# Patient Record
Sex: Male | Born: 1935
Health system: Southern US, Community
[De-identification: ages and names within clinical notes are randomized; demographics above are authoritative.]

## PROBLEM LIST (undated history)

## (undated) DIAGNOSIS — J449 Chronic obstructive pulmonary disease, unspecified: Secondary | ICD-10-CM

## (undated) DIAGNOSIS — R7303 Prediabetes: Secondary | ICD-10-CM

## (undated) DIAGNOSIS — G4733 Obstructive sleep apnea (adult) (pediatric): Secondary | ICD-10-CM

## (undated) DIAGNOSIS — I714 Abdominal aortic aneurysm, without rupture, unspecified: Secondary | ICD-10-CM

## (undated) DIAGNOSIS — N200 Calculus of kidney: Secondary | ICD-10-CM

## (undated) DIAGNOSIS — Z87891 Personal history of nicotine dependence: Secondary | ICD-10-CM

## (undated) DIAGNOSIS — IMO0001 Reserved for inherently not codable concepts without codable children: Secondary | ICD-10-CM

## (undated) DIAGNOSIS — F419 Anxiety disorder, unspecified: Secondary | ICD-10-CM

## (undated) DIAGNOSIS — Z9989 Dependence on other enabling machines and devices: Secondary | ICD-10-CM

## (undated) DIAGNOSIS — M1711 Unilateral primary osteoarthritis, right knee: Secondary | ICD-10-CM

## (undated) DIAGNOSIS — K5790 Diverticulosis of intestine, part unspecified, without perforation or abscess without bleeding: Secondary | ICD-10-CM

## (undated) DIAGNOSIS — Z952 Presence of prosthetic heart valve: Secondary | ICD-10-CM

## (undated) DIAGNOSIS — I350 Nonrheumatic aortic (valve) stenosis: Secondary | ICD-10-CM

## (undated) DIAGNOSIS — K279 Peptic ulcer, site unspecified, unspecified as acute or chronic, without hemorrhage or perforation: Secondary | ICD-10-CM

## (undated) HISTORY — DX: Anxiety disorder, unspecified: F41.9

## (undated) HISTORY — DX: Peptic ulcer, site unspecified, unspecified as acute or chronic, without hemorrhage or perforation: K27.9

## (undated) HISTORY — PX: EYE SURGERY: SHX253

## (undated) HISTORY — DX: Obstructive sleep apnea (adult) (pediatric): G47.33

## (undated) HISTORY — DX: Personal history of nicotine dependence: Z87.891

## (undated) HISTORY — DX: Obstructive sleep apnea (adult) (pediatric): Z99.89

## (undated) HISTORY — PX: TONSILLECTOMY: SUR1361

## (undated) HISTORY — DX: Nonrheumatic aortic (valve) stenosis: I35.0

## (undated) HISTORY — DX: Diverticulosis of intestine, part unspecified, without perforation or abscess without bleeding: K57.90

## (undated) HISTORY — DX: Chronic obstructive pulmonary disease, unspecified: J44.9

## (undated) HISTORY — DX: Unilateral primary osteoarthritis, right knee: M17.11

## (undated) HISTORY — PX: CHOLECYSTECTOMY: SHX55

## (undated) HISTORY — DX: Calculus of kidney: N20.0

---

## 1983-06-17 HISTORY — PX: KNEE SURGERY: SHX244

## 1998-12-08 ENCOUNTER — Inpatient Hospital Stay (HOSPITAL_COMMUNITY): Admission: EM | Admit: 1998-12-08 | Discharge: 1998-12-13 | Payer: Self-pay | Admitting: Emergency Medicine

## 1998-12-08 ENCOUNTER — Encounter: Payer: Self-pay | Admitting: *Deleted

## 1998-12-12 ENCOUNTER — Encounter: Payer: Self-pay | Admitting: Internal Medicine

## 1999-01-25 ENCOUNTER — Ambulatory Visit: Admission: RE | Admit: 1999-01-25 | Discharge: 1999-01-25 | Payer: Self-pay | Admitting: Pulmonary Disease

## 1999-01-25 ENCOUNTER — Encounter: Payer: Self-pay | Admitting: Pulmonary Disease

## 1999-02-15 ENCOUNTER — Encounter: Payer: Self-pay | Admitting: Family Medicine

## 1999-02-15 LAB — CONVERTED CEMR LAB: PSA: 0.1 ng/mL

## 1999-12-13 ENCOUNTER — Ambulatory Visit (HOSPITAL_BASED_OUTPATIENT_CLINIC_OR_DEPARTMENT_OTHER): Admission: RE | Admit: 1999-12-13 | Discharge: 1999-12-13 | Payer: Self-pay | Admitting: Pulmonary Disease

## 1999-12-13 ENCOUNTER — Encounter: Payer: Self-pay | Admitting: Pulmonary Disease

## 2000-07-15 ENCOUNTER — Ambulatory Visit (HOSPITAL_BASED_OUTPATIENT_CLINIC_OR_DEPARTMENT_OTHER): Admission: RE | Admit: 2000-07-15 | Discharge: 2000-07-15 | Payer: Self-pay | Admitting: Internal Medicine

## 2000-08-16 ENCOUNTER — Encounter: Payer: Self-pay | Admitting: Pulmonary Disease

## 2001-09-14 ENCOUNTER — Encounter: Payer: Self-pay | Admitting: Family Medicine

## 2001-12-12 ENCOUNTER — Emergency Department (HOSPITAL_COMMUNITY): Admission: EM | Admit: 2001-12-12 | Discharge: 2001-12-13 | Payer: Self-pay | Admitting: Emergency Medicine

## 2001-12-13 ENCOUNTER — Encounter: Payer: Self-pay | Admitting: Emergency Medicine

## 2002-12-15 ENCOUNTER — Encounter: Payer: Self-pay | Admitting: Family Medicine

## 2002-12-15 LAB — CONVERTED CEMR LAB: PSA: 0.1 ng/mL

## 2003-08-30 ENCOUNTER — Ambulatory Visit (HOSPITAL_BASED_OUTPATIENT_CLINIC_OR_DEPARTMENT_OTHER): Admission: RE | Admit: 2003-08-30 | Discharge: 2003-08-30 | Payer: Self-pay | Admitting: Pulmonary Disease

## 2003-10-04 ENCOUNTER — Encounter: Payer: Self-pay | Admitting: Pulmonary Disease

## 2004-01-28 ENCOUNTER — Emergency Department (HOSPITAL_COMMUNITY): Admission: EM | Admit: 2004-01-28 | Discharge: 2004-01-29 | Payer: Self-pay | Admitting: Emergency Medicine

## 2004-05-29 ENCOUNTER — Ambulatory Visit: Payer: Self-pay | Admitting: Family Medicine

## 2004-06-27 ENCOUNTER — Ambulatory Visit: Payer: Self-pay | Admitting: Pulmonary Disease

## 2005-02-01 IMAGING — CR DG CHEST 1V PORT
1 series · 1 of 1 positions shown · non-contrast
Comparison: none

CLINICAL DATA: Chest pain. 
 PORTABLE CHEST, 01/28/04, [DATE] HOURS

[view not recorded]
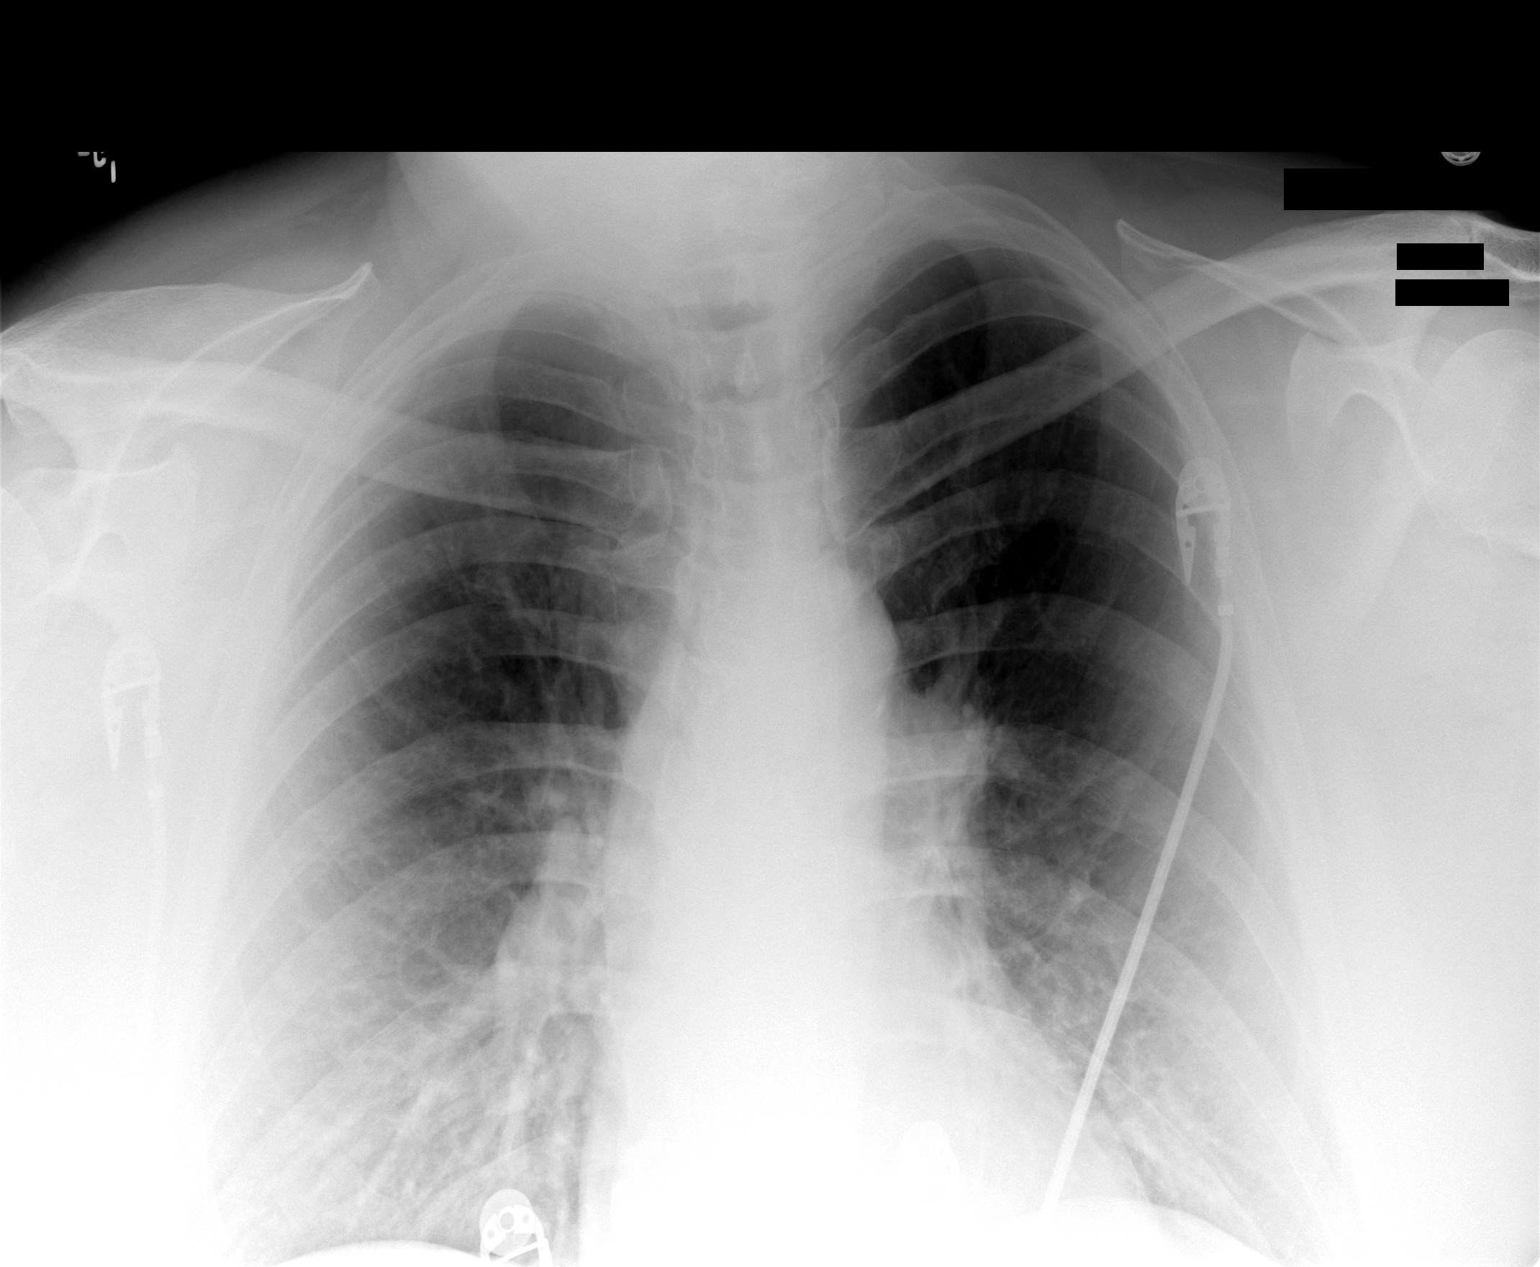

[1 of 1 positions shown; findings below may reference images not displayed]

FINDINGS: The costophrenic angles were not included in the study.  The visualized lung zones are clear.  The heart is normal in size.  No pneumothoraces or effusions are seen.
IMPRESSION: No evidence of acute cardiopulmonary disease.

## 2005-08-29 ENCOUNTER — Ambulatory Visit: Payer: Self-pay | Admitting: Family Medicine

## 2006-02-03 ENCOUNTER — Ambulatory Visit: Payer: Self-pay | Admitting: Family Medicine

## 2006-02-03 LAB — CONVERTED CEMR LAB: PSA: 0.13 ng/mL

## 2006-02-05 ENCOUNTER — Ambulatory Visit: Payer: Self-pay | Admitting: Family Medicine

## 2006-02-27 ENCOUNTER — Ambulatory Visit: Payer: Self-pay | Admitting: Pulmonary Disease

## 2006-03-02 ENCOUNTER — Ambulatory Visit: Payer: Self-pay | Admitting: Family Medicine

## 2006-03-03 ENCOUNTER — Encounter: Payer: Self-pay | Admitting: Pulmonary Disease

## 2006-03-03 ENCOUNTER — Ambulatory Visit: Payer: Self-pay | Admitting: Cardiology

## 2006-04-27 ENCOUNTER — Ambulatory Visit: Payer: Self-pay | Admitting: Family Medicine

## 2006-08-13 ENCOUNTER — Ambulatory Visit: Payer: Self-pay | Admitting: Pulmonary Disease

## 2006-09-03 ENCOUNTER — Ambulatory Visit: Payer: Self-pay | Admitting: Family Medicine

## 2006-09-03 LAB — CONVERTED CEMR LAB
Cholesterol: 164 mg/dL (ref 0–200)
HDL: 37.8 mg/dL — ABNORMAL LOW (ref >39.0)
LDL Cholesterol: 100 mg/dL — ABNORMAL HIGH (ref 0–99)
Triglycerides: 130 mg/dL (ref 0–149)
VLDL: 26 mg/dL (ref 0–40)

## 2006-09-07 ENCOUNTER — Ambulatory Visit: Payer: Self-pay | Admitting: Family Medicine

## 2006-09-18 ENCOUNTER — Ambulatory Visit: Payer: Self-pay | Admitting: Family Medicine

## 2006-09-18 LAB — CONVERTED CEMR LAB
BUN: 13 mg/dL (ref 6–23)
CO2: 36 meq/L — ABNORMAL HIGH (ref 19–32)
Creatinine, Ser: 0.9 mg/dL (ref 0.4–1.5)
Glucose, Bld: 133 mg/dL — ABNORMAL HIGH (ref 70–99)
Potassium: 4.2 meq/L (ref 3.5–5.1)

## 2006-09-21 ENCOUNTER — Ambulatory Visit: Payer: Self-pay | Admitting: Family Medicine

## 2006-10-02 ENCOUNTER — Ambulatory Visit: Payer: Self-pay | Admitting: Family Medicine

## 2006-10-02 LAB — CONVERTED CEMR LAB
CO2: 33 meq/L — ABNORMAL HIGH (ref 19–32)
Calcium: 9.3 mg/dL (ref 8.4–10.5)
Chloride: 105 meq/L (ref 96–112)
Glucose, Bld: 103 mg/dL — ABNORMAL HIGH (ref 70–99)
Sodium: 145 meq/L (ref 135–145)

## 2006-10-06 ENCOUNTER — Ambulatory Visit: Payer: Self-pay | Admitting: Family Medicine

## 2006-10-20 ENCOUNTER — Telehealth (INDEPENDENT_AMBULATORY_CARE_PROVIDER_SITE_OTHER): Payer: Self-pay | Admitting: *Deleted

## 2006-10-21 ENCOUNTER — Ambulatory Visit: Payer: Self-pay | Admitting: Family Medicine

## 2006-10-26 ENCOUNTER — Ambulatory Visit: Payer: Self-pay | Admitting: Family Medicine

## 2006-10-27 ENCOUNTER — Ambulatory Visit: Payer: Self-pay | Admitting: Family Medicine

## 2006-10-29 ENCOUNTER — Ambulatory Visit: Payer: Self-pay | Admitting: Family Medicine

## 2006-10-30 ENCOUNTER — Telehealth (INDEPENDENT_AMBULATORY_CARE_PROVIDER_SITE_OTHER): Payer: Self-pay | Admitting: *Deleted

## 2006-11-02 ENCOUNTER — Ambulatory Visit: Payer: Self-pay | Admitting: Family Medicine

## 2006-11-10 ENCOUNTER — Telehealth (INDEPENDENT_AMBULATORY_CARE_PROVIDER_SITE_OTHER): Payer: Self-pay | Admitting: *Deleted

## 2006-11-13 ENCOUNTER — Telehealth: Payer: Self-pay | Admitting: Family Medicine

## 2006-11-16 ENCOUNTER — Ambulatory Visit: Payer: Self-pay | Admitting: Family Medicine

## 2006-11-19 ENCOUNTER — Encounter: Payer: Self-pay | Admitting: Family Medicine

## 2006-11-19 DIAGNOSIS — G4730 Sleep apnea, unspecified: Secondary | ICD-10-CM | POA: Insufficient documentation

## 2006-11-19 DIAGNOSIS — R222 Localized swelling, mass and lump, trunk: Secondary | ICD-10-CM

## 2006-11-19 DIAGNOSIS — Z87891 Personal history of nicotine dependence: Secondary | ICD-10-CM | POA: Insufficient documentation

## 2006-11-19 DIAGNOSIS — M549 Dorsalgia, unspecified: Secondary | ICD-10-CM | POA: Insufficient documentation

## 2006-11-19 DIAGNOSIS — N200 Calculus of kidney: Secondary | ICD-10-CM

## 2006-11-19 DIAGNOSIS — N400 Enlarged prostate without lower urinary tract symptoms: Secondary | ICD-10-CM

## 2006-11-19 DIAGNOSIS — K279 Peptic ulcer, site unspecified, unspecified as acute or chronic, without hemorrhage or perforation: Secondary | ICD-10-CM | POA: Insufficient documentation

## 2006-11-20 ENCOUNTER — Ambulatory Visit: Payer: Self-pay | Admitting: Family Medicine

## 2006-12-04 ENCOUNTER — Ambulatory Visit: Payer: Self-pay | Admitting: Family Medicine

## 2006-12-04 DIAGNOSIS — D485 Neoplasm of uncertain behavior of skin: Secondary | ICD-10-CM

## 2007-02-10 ENCOUNTER — Ambulatory Visit: Payer: Self-pay | Admitting: Family Medicine

## 2007-02-10 DIAGNOSIS — J439 Emphysema, unspecified: Secondary | ICD-10-CM

## 2007-02-10 DIAGNOSIS — I10 Essential (primary) hypertension: Secondary | ICD-10-CM | POA: Insufficient documentation

## 2007-02-11 LAB — CONVERTED CEMR LAB
Albumin: 3.7 g/dL (ref 3.5–5.2)
Basophils Absolute: 0 10*3/uL (ref 0.0–0.1)
Cholesterol: 171 mg/dL (ref 0–200)
Creatinine, Ser: 0.9 mg/dL (ref 0.4–1.5)
Eosinophils Absolute: 0.2 10*3/uL (ref 0.0–0.6)
GFR calc Af Amer: 107 mL/min
GFR calc non Af Amer: 88 mL/min
HCT: 39.8 % (ref 39.0–52.0)
HDL: 36.6 mg/dL — ABNORMAL LOW (ref >39.0)
Hemoglobin: 13.6 g/dL (ref 13.0–17.0)
Lymphocytes Relative: 40.6 % (ref 12.0–46.0)
MCHC: 34.3 g/dL (ref 30.0–36.0)
MCV: 91.3 fL (ref 78.0–100.0)
Monocytes Absolute: 0.4 10*3/uL (ref 0.2–0.7)
Neutro Abs: 2.2 10*3/uL (ref 1.4–7.7)
Neutrophils Relative %: 47.3 % (ref 43.0–77.0)
PSA: 0.2 ng/mL (ref 0.10–4.00)
Potassium: 4.6 meq/L (ref 3.5–5.1)
Sodium: 145 meq/L (ref 135–145)
TSH: 4.93 microintl units/mL (ref 0.35–5.50)
Total Bilirubin: 1.2 mg/dL (ref 0.3–1.2)

## 2007-02-18 ENCOUNTER — Ambulatory Visit: Payer: Self-pay | Admitting: Family Medicine

## 2007-02-18 DIAGNOSIS — R7309 Other abnormal glucose: Secondary | ICD-10-CM | POA: Insufficient documentation

## 2007-02-18 DIAGNOSIS — R1084 Generalized abdominal pain: Secondary | ICD-10-CM | POA: Insufficient documentation

## 2007-03-04 ENCOUNTER — Ambulatory Visit: Payer: Self-pay | Admitting: Pulmonary Disease

## 2007-03-11 ENCOUNTER — Ambulatory Visit: Payer: Self-pay | Admitting: Gastroenterology

## 2007-03-22 ENCOUNTER — Ambulatory Visit: Payer: Self-pay | Admitting: Family Medicine

## 2007-03-23 ENCOUNTER — Ambulatory Visit: Payer: Self-pay | Admitting: Gastroenterology

## 2007-03-23 ENCOUNTER — Encounter: Payer: Self-pay | Admitting: Family Medicine

## 2007-03-23 DIAGNOSIS — K5730 Diverticulosis of large intestine without perforation or abscess without bleeding: Secondary | ICD-10-CM | POA: Insufficient documentation

## 2007-03-23 DIAGNOSIS — K648 Other hemorrhoids: Secondary | ICD-10-CM | POA: Insufficient documentation

## 2007-06-01 ENCOUNTER — Telehealth: Payer: Self-pay | Admitting: Family Medicine

## 2007-06-07 ENCOUNTER — Encounter (INDEPENDENT_AMBULATORY_CARE_PROVIDER_SITE_OTHER): Payer: Self-pay | Admitting: *Deleted

## 2007-07-12 ENCOUNTER — Ambulatory Visit: Payer: Self-pay | Admitting: Family Medicine

## 2007-07-14 ENCOUNTER — Ambulatory Visit: Payer: Self-pay | Admitting: Family Medicine

## 2007-07-17 ENCOUNTER — Encounter: Payer: Self-pay | Admitting: Pulmonary Disease

## 2007-08-27 DIAGNOSIS — J439 Emphysema, unspecified: Secondary | ICD-10-CM

## 2007-08-27 DIAGNOSIS — F411 Generalized anxiety disorder: Secondary | ICD-10-CM | POA: Insufficient documentation

## 2007-08-27 DIAGNOSIS — D126 Benign neoplasm of colon, unspecified: Secondary | ICD-10-CM

## 2007-10-21 ENCOUNTER — Telehealth: Payer: Self-pay | Admitting: Family Medicine

## 2008-02-01 ENCOUNTER — Ambulatory Visit: Payer: Self-pay | Admitting: Pulmonary Disease

## 2008-02-01 DIAGNOSIS — J438 Other emphysema: Secondary | ICD-10-CM | POA: Insufficient documentation

## 2008-02-18 ENCOUNTER — Telehealth (INDEPENDENT_AMBULATORY_CARE_PROVIDER_SITE_OTHER): Payer: Self-pay | Admitting: *Deleted

## 2008-02-22 ENCOUNTER — Ambulatory Visit: Payer: Self-pay | Admitting: Family Medicine

## 2008-04-08 ENCOUNTER — Encounter: Payer: Self-pay | Admitting: Pulmonary Disease

## 2008-07-24 ENCOUNTER — Ambulatory Visit: Payer: Self-pay | Admitting: Pulmonary Disease

## 2008-08-03 ENCOUNTER — Ambulatory Visit: Payer: Self-pay | Admitting: Pulmonary Disease

## 2008-08-03 DIAGNOSIS — J9611 Chronic respiratory failure with hypoxia: Secondary | ICD-10-CM | POA: Insufficient documentation

## 2008-08-03 DIAGNOSIS — J9610 Chronic respiratory failure, unspecified whether with hypoxia or hypercapnia: Secondary | ICD-10-CM

## 2008-08-10 ENCOUNTER — Encounter: Payer: Self-pay | Admitting: Internal Medicine

## 2008-10-03 ENCOUNTER — Telehealth (INDEPENDENT_AMBULATORY_CARE_PROVIDER_SITE_OTHER): Payer: Self-pay | Admitting: *Deleted

## 2009-01-10 ENCOUNTER — Ambulatory Visit: Payer: Self-pay | Admitting: Family Medicine

## 2009-01-10 LAB — CONVERTED CEMR LAB
ALT: 27 units/L (ref 0–53)
AST: 27 units/L (ref 0–37)
Albumin: 3.8 g/dL (ref 3.5–5.2)
BUN: 13 mg/dL (ref 6–23)
Chloride: 106 meq/L (ref 96–112)
Cholesterol: 190 mg/dL (ref 0–200)
Eosinophils Relative: 3.1 % (ref 0.0–5.0)
GFR calc non Af Amer: 100.62 mL/min (ref >60)
Glucose, Bld: 102 mg/dL — ABNORMAL HIGH (ref 70–99)
HCT: 39.5 % (ref 39.0–52.0)
Hemoglobin: 13.4 g/dL (ref 13.0–17.0)
Lymphs Abs: 1.8 10*3/uL (ref 0.7–4.0)
MCV: 92.7 fL (ref 78.0–100.0)
Microalb, Ur: 6.4 mg/dL — ABNORMAL HIGH (ref 0.0–1.9)
Monocytes Absolute: 0.5 10*3/uL (ref 0.1–1.0)
Monocytes Relative: 10.3 % (ref 3.0–12.0)
Neutro Abs: 2.3 10*3/uL (ref 1.4–7.7)
Platelets: 212 10*3/uL (ref 150.0–400.0)
Potassium: 4.3 meq/L (ref 3.5–5.1)
RDW: 13.3 % (ref 11.5–14.6)
Sodium: 145 meq/L (ref 135–145)
TSH: 4.58 microintl units/mL (ref 0.35–5.50)
Total Bilirubin: 1.4 mg/dL — ABNORMAL HIGH (ref 0.3–1.2)
WBC: 4.7 10*3/uL (ref 4.5–10.5)

## 2009-01-15 ENCOUNTER — Ambulatory Visit: Payer: Self-pay | Admitting: Family Medicine

## 2009-02-05 IMAGING — CR DG CHEST 2V
2 series · 2 of 2 positions shown · non-contrast
Comparison: Scout film from CT chest of 03/03/2006

CLINICAL DATA: Emphysema, sleep apnea, shortness of breath

CHEST - 2 VIEW

[view not recorded (1 of 2)]
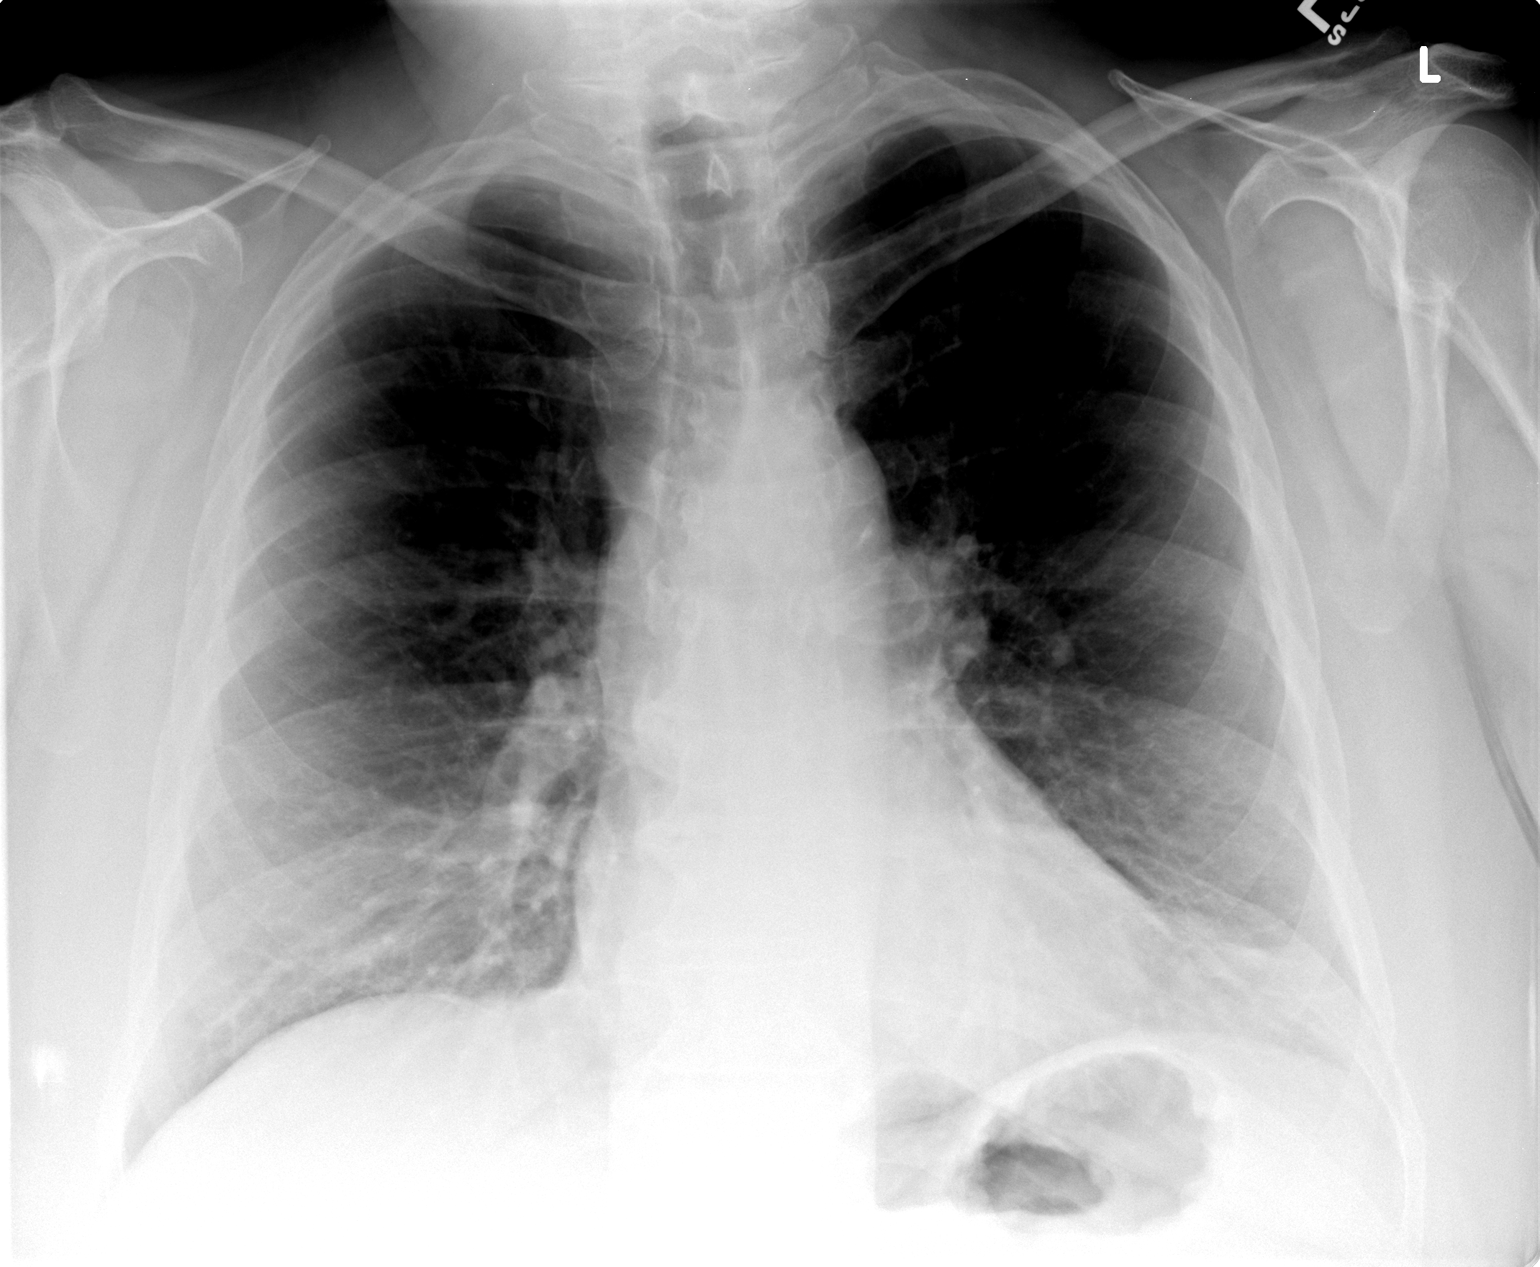

[view not recorded (2 of 2)]
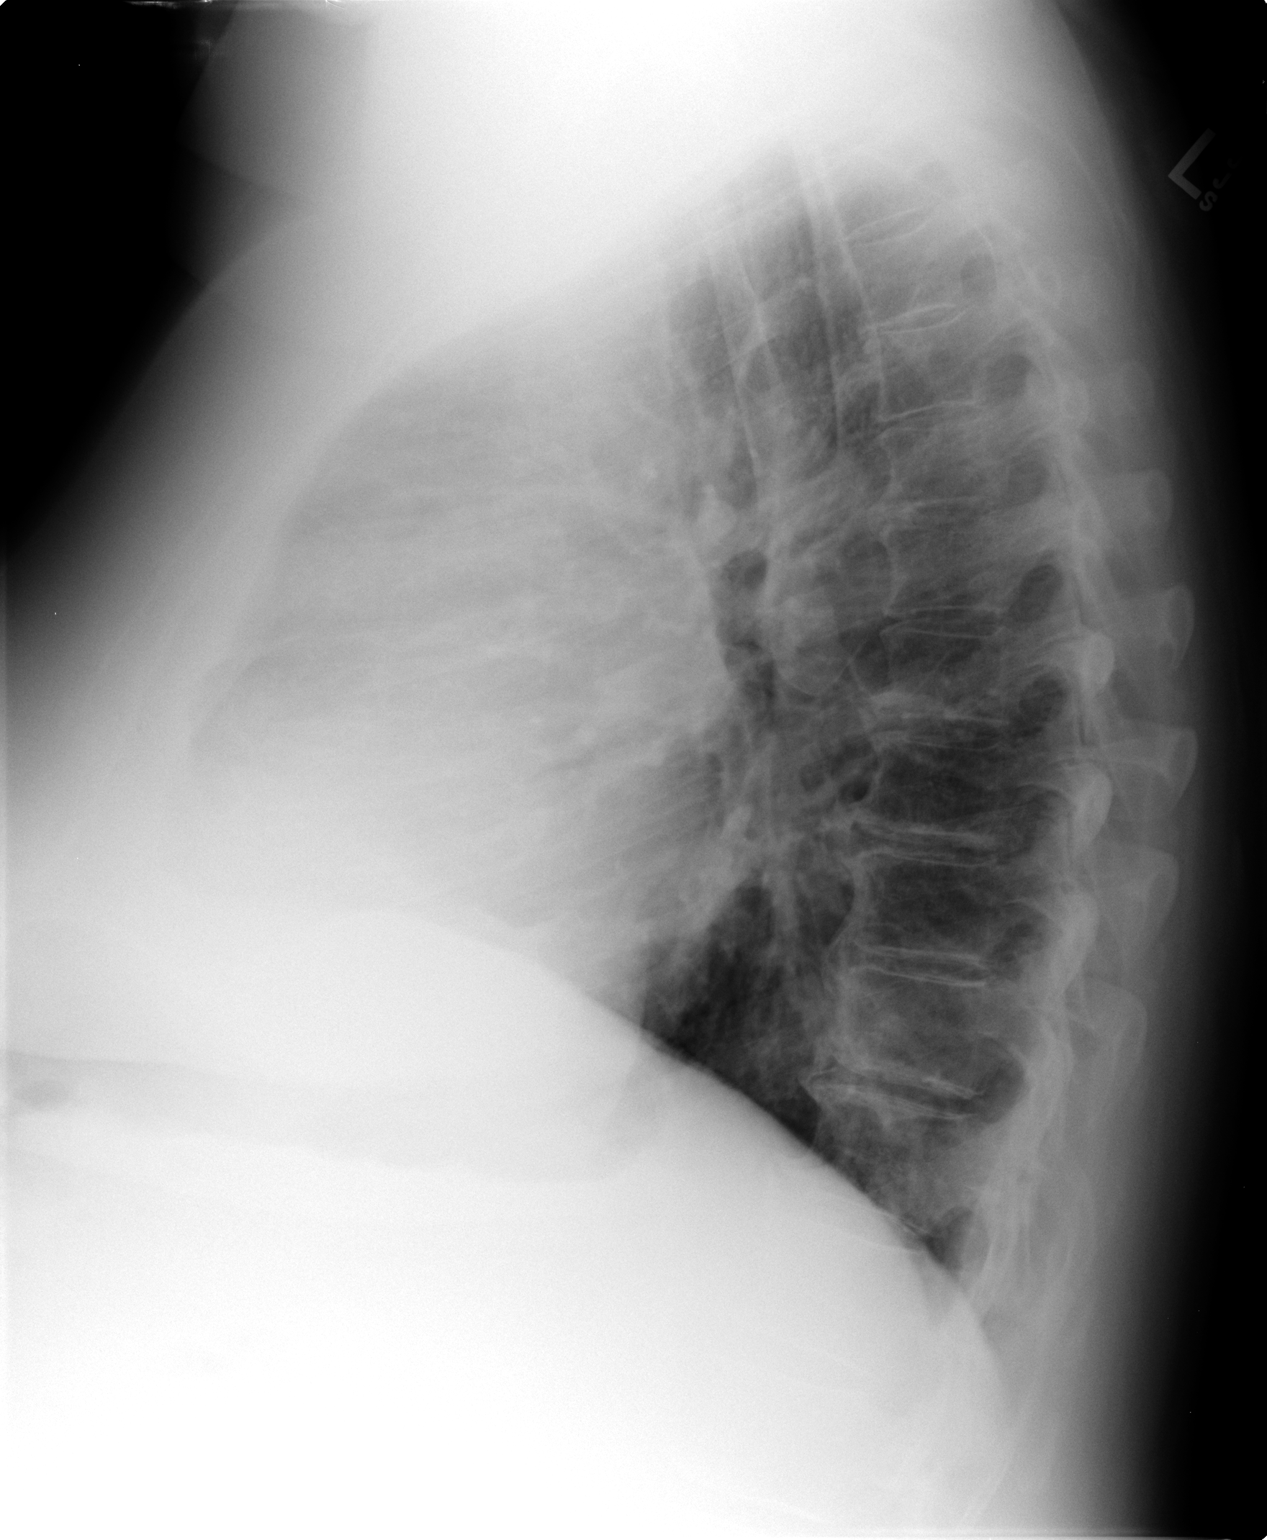

[2 of 2 positions shown; findings below may reference images not displayed]

FINDINGS: The lungs are clear and slightly hyperaerated.  Mild
cardiomegaly is stable.  There are degenerative changes throughout
the thoracic spine.  The small superior anterior mediastinal soft
tissue nodule noted on CT is not visible on chest x-ray.
IMPRESSION: No active lung disease.  Slight hyperaeration.  Mild cardiomegaly.

## 2009-04-10 ENCOUNTER — Ambulatory Visit: Payer: Self-pay | Admitting: Family Medicine

## 2009-04-23 ENCOUNTER — Encounter: Payer: Self-pay | Admitting: Family Medicine

## 2009-04-23 ENCOUNTER — Encounter: Payer: Self-pay | Admitting: Pulmonary Disease

## 2009-05-16 ENCOUNTER — Encounter: Payer: Self-pay | Admitting: Family Medicine

## 2009-07-31 ENCOUNTER — Ambulatory Visit: Payer: Self-pay | Admitting: Pulmonary Disease

## 2009-07-31 DIAGNOSIS — G4733 Obstructive sleep apnea (adult) (pediatric): Secondary | ICD-10-CM | POA: Insufficient documentation

## 2010-01-17 ENCOUNTER — Encounter (INDEPENDENT_AMBULATORY_CARE_PROVIDER_SITE_OTHER): Payer: Self-pay | Admitting: *Deleted

## 2010-01-28 ENCOUNTER — Ambulatory Visit: Payer: Self-pay | Admitting: Pulmonary Disease

## 2010-04-10 ENCOUNTER — Ambulatory Visit: Payer: Self-pay | Admitting: Family Medicine

## 2010-04-10 LAB — CONVERTED CEMR LAB
Bacteria, UA: 0
Blood in Urine, dipstick: NEGATIVE
Glucose, Urine, Semiquant: NEGATIVE
Ketones, urine, test strip: NEGATIVE
Nitrite: NEGATIVE
RBC / HPF: 0

## 2010-05-31 ENCOUNTER — Ambulatory Visit: Payer: Self-pay | Admitting: Urology

## 2010-07-16 NOTE — Progress Notes (Signed)
Summary: 02 sat  Phone Note From Other Clinic Call back at [phone]   Caller: mary @ [facility] Call For: Clance Summary of Call: Need most recent 02 saturations faxed to [phone] Initial call taken by: Eugene Gavia,  February 18, 2008 8:43 AM  Follow-up for Phone Call        o2 sat from 8/18 ov faxed to [facility]-this was not a qualifying sat for o2 but I was unable to reach mary with [facility] to find out what she needed this for because she was out until 02/22/08-sent fax if this is not what she needs sent note for her to give [place] a call back on her return Follow-up by: Philipp Deputy CMA,  February 18, 2008 10:54 AM

## 2010-07-16 NOTE — Assessment & Plan Note (Signed)
Summary: STOMACH/CLE   Vital Signs:  Patient profile:   77 year old male Weight:      292.75 pounds Temp:     97.8 degrees F oral Pulse rate:   84 / minute Pulse rhythm:   regular BP sitting:   118 / 80  (left arm) Cuff size:   large  Vitals Entered By: Sydell Axon LPN (April 10, 2010 11:24 AM) CC: Stomach problems, feels like he has to have a BM a lot, ? kidney stone, burning, urine frequency and urgency   History of Present Illness: Pt here with his wife with feeling like he constantly needs to go to the [facility] for a BM. He has had the sensation of leaving for somewhere and needs to go but can't. He also feels like he has a kidney stone...he has pain in the flanks and has started drinking lots of fluids...he thinks he may have washed the stones down. We discussed drinking cherry juice.  Problems Prior to Update: 1)  Obesity Hypoventilation Syndrome  (ICD-278.03) 2)  Chronic Respiratory Failure  (ZOX-096.04) 3)  Morbid Obesity  (ICD-278.01) 4)  Abnormal Chest Xray  (ICD-793.1) 5)  Emphysema  (ICD-492.8) 6)  Obstructive Sleep Apnea  (ICD-327.23) 7)  Anxiety  (ICD-300.00) 8)  COPD  (ICD-496) 9)  Polyp, Colon  (ICD-211.3) 10)  Diverticulosis, Colon  (ICD-562.10) 11)  Internal Hemorrhoids  (ICD-455.0) 12)  Abnormal Chest Sounds/decreased Right  (ICD-786.7) 13)  Screening For Malignannt Neoplasm, Site Nec  (ICD-V76.49) 14)  Hyperglycemia  (ICD-790.29) 15)  Symptom, Pain, Abdominal, Generalized  (ICD-789.07) 16)  Abdominal Pain  () 17)  Hypertension, Benign Essential  (ICD-401.1) 18)  Emphysematous Bleb  (ICD-492.0) 19)  Neoplasm, Skin, Uncertain Behavior  (ICD-238.2) 20)  Symptom, Swelling/mass/lump in Chest  (ICD-786.6) 21)  Varicose Vein Symptomatic  (ICD-456.8) 22)  Gilbert's Syndrome, Elevated Bili  (ICD-277.4) 23)  Sleep Apnea On Cpap-gerd09/01  (ICD-780.57) 24)  Benign Prostatic Hypertrophy, Hx of  (ICD-V13.8) 25)  Hypercholesterolemia, 199/170  (ICD-272.0) 26)  Peptic  Ulcer Disease With H-pylori Tx'd  (ICD-533.90) 27)  Back Pain, Chronic  (ICD-724.5) 28)  Nephrolithiasis  (ICD-592.0) 29)  Tobacco Use, Quit  (ICD-V15.82) 30)  Emphysema With Acute Respiratory Failure  (ICD-492.8)  Medications Prior to Update: 1)  Lorazepam 1 Mg Tabs (Lorazepam) .Marland Kitchen.. 1 Q6 Hours Prn 2)  Proair Hfa 108 (90 Base) Mcg/act Aers (Albuterol Sulfate) .... Inhale 2 Puffs Every 4 To 6 Hours As Needed For Shortness of Breath 3)  Symbicort 160-4.5 Mcg/act Aero (Budesonide-Formoterol Fumarate) .... Inhale 2 Puffs Two Times A Day 4)  Oxygen 4 L 5)  Cpap At Bedtime  Allergies: 1)  ! Macrodantin (Nitrofurantoin Macrocrystal) 2)  ! Celebrex (Celecoxib) 3)  ! Clinoril (Sulindac) 4)  ! Lipitor (Atorvastatin Calcium)  Physical Exam  General:  Well-developed,well-nourished,in no acute distress; alert,appropriate and cooperative throughout examination, morbidly obese. Head:  Normocephalic and atraumatic without obvious abnormalities. No apparent alopecia or balding. Eyes:  Conjunctiva clear bilaterally.  Ears:  External ear exam shows no significant lesions or deformities.  Otoscopic examination reveals clear canals, tympanic membranes are intact bilaterally without bulging, retraction, inflammation or discharge. Hearing is grossly normal bilaterally. Nose:  no skin breakdown or pressure necrosis from cpap mask. nasal passages clear. Mouth:  Oral mucosa and oropharynx without lesions or exudates.  Teeth in good repair. Neck:  No deformities, masses, or tenderness noted. Chest Wall:  No deformities, masses, tenderness  noted. Physiologic gynecomastia noted bilat. Lungs:  mildly decreased bs, no wheezing,  incr I:E 1:4, worse w/ recumbency. Heart:  Normal rate and regular rhythm. S1 and S2 normal without gallop, click, rub or other extra sounds other than I-II/VI syst murmur left sternal border.. Abdomen:  Bowel sounds positive and very active, abdomen soft and non-tender without masses,  organomegaly or hernias noted. Large double panniculous with mild erythema below. Large diagonal choley scar well-healed. Rectal:  Not done.   Impression & Recommendations:  Problem # 1:  SYMPTOM, PAIN, ABDOMINAL, GENERALIZED (ICD-789.07) Assessment Deteriorated Sounds to me as if he is having chronic constipation causing a number of problems. Suggest fiber supplementation...encouraged to use chronically. Discussesd at length. U/A nml.   Problem # 2:  NEPHROLITHIASIS (ICD-592.0) Assessment: Unchanged If having stones, use cherry juice for symptomatic trmt and continue chronic fluid intake, preferably water.  Complete Medication List: 1)  Lorazepam 1 Mg Tabs (Lorazepam) .Marland Kitchen.. 1 by mouth every 6 hours  as needed 2)  Proair Hfa 108 (90 Base) Mcg/act Aers (Albuterol sulfate) .... Inhale 2 puffs every 4 to 6 hours as needed for shortness of breath 3)  Symbicort 160-4.5 Mcg/act Aero (Budesonide-formoterol fumarate) .... Inhale 2 puffs two times a day 4)  Oxygen 4 L  5)  Cpap At Bedtime   Other Orders: UA Dipstick W/ Micro (manual) (19147) Flu Vaccine 71yrs + MEDICARE PATIENTS (W2956) Administration Flu vaccine - MCR (O1308)  Patient Instructions: 1)  Try Citrucel in 8 oz water every AM. 2)  RTC if sxs continue.   Orders Added: 1)  UA Dipstick W/ Micro (manual) [81000] 2)  Flu Vaccine 15yrs + MEDICARE PATIENTS [Q2039] 3)  Administration Flu vaccine - MCR [G0008] 4)  Est. Patient Level III [65784]    Current Allergies (reviewed today): ! MACRODANTIN (NITROFURANTOIN MACROCRYSTAL) ! CELEBREX (CELECOXIB) ! CLINORIL (SULINDAC) ! LIPITOR (ATORVASTATIN CALCIUM)  Laboratory Results   Urine Tests  Date/Time Received: April 10, 2010 11:40 AM  Date/Time Reported: April 10, 2010 11:40 AM   Routine Urinalysis   Color: yellow Appearance: Clear Glucose: negative   (Normal Range: Negative) Bilirubin: negative   (Normal Range: Negative) Ketone: negative   (Normal Range:  Negative) Spec. Gravity: 1.020   (Normal Range: 1.003-1.035) Blood: negative   (Normal Range: Negative) pH: 5.0   (Normal Range: 5.0-8.0) Protein: trace   (Normal Range: Negative) Urobilinogen: 0.2   (Normal Range: 0-1) Nitrite: negative   (Normal Range: Negative) Leukocyte Esterace: trace   (Normal Range: Negative)  Urine Microscopic WBC/HPF: 0-1 RBC/HPF: 0 Bacteria/HPF: 0 Epithelial/HPF: Rare         Flu Vaccine Consent Questions     Do you have a history of severe allergic reactions to this vaccine? no    Any prior history of allergic reactions to egg and/or gelatin? no    Do you have a sensitivity to the preservative Thimersol? no    Do you have a past history of Guillan-Barre Syndrome? no    Do you currently have an acute febrile illness? no    Have you ever had a severe reaction to latex? no    Vaccine information given and explained to patient? yes    Are you currently pregnant? no    Lot Number:[id number]   Exp Date:12/14/2010   Site Given  Left Deltoid IMmedflu

## 2010-07-16 NOTE — Assessment & Plan Note (Signed)
Summary: rov for emphysema, osa, ohs   Primary Provider/Referring Provider:  Hetty Ely  CC:  Pt is here for a f/u appt.  Pt was last seen Feb 2010.  Pt states he wears his cpap machine every night.  Approx 6 to 7 hours per night.  Pt denied any complaints with mask or pressure.  Pt c/o increased sob with exertion.  Pt c/o coughing up clear sputum.  Marland Kitchen  History of Present Illness: the pt comes in today for f/u of his known chronic RF secondary to emphysema and OHS/OSA.  He has been wearing oxygen complaintly at hs and with prolonged exertion, but does not wear with short term exertional activities.  He is wearing cpap compliantly, and recently got a new cpap machine.  He is having no mask or pressure issues.  He has no significant cough or mucus.  He does get sob with some exertional activities, but admits it is often when he isn't wearing oxygen.  Current Medications (verified): 1)  Lorazepam 1 Mg Tabs (Lorazepam) .Marland Kitchen.. 1 Q6 Hours Prn 2)  Proair Hfa 108 (90 Base) Mcg/act Aers (Albuterol Sulfate) .... Inhale 2 Puffs Every 4 To 6 Hours As Needed For Shortness of Breath 3)  Symbicort 160-4.5 Mcg/act Aero (Budesonide-Formoterol Fumarate) .... Inhale 2 Puffs Two Times A Day 4)  Oxygen 2 L .... 24/7 Continuous 5)  Cpap At Bedtime  Allergies (verified): 1)  ! Macrodantin (Nitrofurantoin Macrocrystal) 2)  ! Celebrex (Celecoxib) 3)  ! Clinoril (Sulindac) 4)  ! Lipitor (Atorvastatin Calcium)  Review of Systems      See HPI  Vital Signs:  Patient profile:   77 year old male Height:      70.5 inches Weight:      306.25 pounds BMI:     43.48 O2 Sat:      92 % on Room air Temp:     97.6 degrees F oral Pulse rate:   82 / minute BP sitting:   140 / 78  (left arm) Cuff size:   large  Vitals Entered By: Arman Filter LPN (July 31, 2009 9:49 AM)  O2 Flow:  Room air CC: Pt is here for a f/u appt.  Pt was last seen Feb 2010.  Pt states he wears his cpap machine every night.  Approx 6 to 7 hours  per night.  Pt denied any complaints with mask or pressure.  Pt c/o increased sob with exertion.  Pt c/o coughing up clear sputum.   Comments Medications reviewed with patient Arman Filter LPN  July 31, 2009 9:49 AM    Physical Exam  General:  obese male in nad Nose:  no skin breakdown or pressure necrosis from cpap mask Lungs:  mild bibasilar crackles, no wheezing Heart:  rrr, no mrg Extremities:  mild ankle edema Neurologic:  alert, not sleepy, moves all 4.   Impression & Recommendations:  Problem # 1:  EMPHYSEMA (ICD-492.8)  the pt has chronic doe due to his lung disease and obesity hypoventilation.  He doesn't always wear oxygen when he ambulates, and I have explained he will get sob with this.  He has no bronchospasm on exam.  I have also told him again that weight reduction would help his breathing considably.  Problem # 2:  OBSTRUCTIVE SLEEP APNEA (ICD-327.23)  the pt is doing well with cpap, and feels that he is sleeping well  Other Orders: Est. Patient Level III (04540)  Patient Instructions: 1)  no change in cpap, oxygen, or meds.  2)  work aggressively on weight loss. 3)  followup with me in 6mos.

## 2010-07-16 NOTE — Assessment & Plan Note (Signed)
Summary: rov for ohs/osa, emphysema   Primary Provider/Referring Provider:  Hetty Ely  CC:  Pt is here for a f/u appt.  Pt states he wears his cpap machine every night.  Approx 8 hours per night.   Pt denied any complaints with mask or pressure.  Pt states he has "good days and bad days" regarding his breathing.  Pt states occ cough with small amounts of clear sputum. Marland Kitchen  History of Present Illness: The pt comes in today for f/u of his known emphysema and OHS/OSA.  He is doing very well, and has lost 12 pounds since the last visit.  He is wearing cpap consistently, and denies any issues with the device or mask.  He feels that his breathing is at its usual baseline, and no issues with cough or congestion.  He has been keeping his LE edema fairly well controlled.  Current Medications (verified): 1)  Lorazepam 1 Mg Tabs (Lorazepam) .Marland Kitchen.. 1 Q6 Hours Prn 2)  Proair Hfa 108 (90 Base) Mcg/act Aers (Albuterol Sulfate) .... Inhale 2 Puffs Every 4 To 6 Hours As Needed For Shortness of Breath 3)  Symbicort 160-4.5 Mcg/act Aero (Budesonide-Formoterol Fumarate) .... Inhale 2 Puffs Two Times A Day 4)  Oxygen 4 L 5)  Cpap At Bedtime  Allergies (verified): 1)  ! Macrodantin (Nitrofurantoin Macrocrystal) 2)  ! Celebrex (Celecoxib) 3)  ! Clinoril (Sulindac) 4)  ! Lipitor (Atorvastatin Calcium)  Review of Systems       The patient complains of shortness of breath with activity, productive cough, headaches, and nasal congestion/difficulty breathing through nose.  The patient denies shortness of breath at rest, non-productive cough, coughing up blood, chest pain, irregular heartbeats, acid heartburn, indigestion, loss of appetite, weight change, abdominal pain, difficulty swallowing, sore throat, tooth/dental problems, sneezing, itching, ear ache, anxiety, depression, hand/feet swelling, joint stiffness or pain, rash, change in color of mucus, and fever.    Vital Signs:  Patient profile:   77 year old  male Height:      70.5 inches Weight:      294 pounds BMI:     41.74 O2 Sat:      92 % on Room air Temp:     97.9 degrees F oral Pulse rate:   73 / minute BP sitting:   110 / 60  (left arm) Cuff size:   large  Vitals Entered By: Arman Filter LPN (January 28, 2010 9:23 AM)  O2 Flow:  Room air CC: Pt is here for a f/u appt.  Pt states he wears his cpap machine every night.  Approx 8 hours per night.   Pt denied any complaints with mask or pressure.  Pt states he has "good days and bad days" regarding his breathing.  Pt states occ cough with small amounts of clear sputum.  Comments Medications reviewed with patient Arman Filter LPN  January 28, 2010 9:26 AM    Physical Exam  General:  obese male in nad Nose:  no skin breakdown or pressure necrosis from cpap mask Lungs:  decreased bs throughout, no wheezing or rhonchi Heart:  rrr, 2/6 sem Extremities:  mild ankle edema, no cyanosis  Neurologic:  alert and oriented, moves all 4.   Impression & Recommendations:  Problem # 1:  OBESITY HYPOVENTILATION SYNDROME (ICD-278.03) the pt is doing well with cpap, and has lost weight since the last visit.  He appears to be rested, has better control of his LE edema, and denies significant daytime sleepiness.  I have asked  him to keep up with cpap supplies, and to continue working on weight loss  Problem # 2:  EMPHYSEMA (ICD-492.8) the pt's breathing is at his usual baseline.  He has had no recent exacerbations, and his exertional tolerance is stable.  Weight loss will also help his endurance/QOL from a breathing standpoint.  Medications Added to Medication List This Visit: 1)  Oxygen 4 L   Other Orders: Est. Patient Level III (16109)  Patient Instructions: 1)  no change in meds. 2)  continue to wear your cpap 3)  you are doing great with weight loss, keep it up. 4)  followup with me in 6mos.

## 2010-07-16 NOTE — Letter (Signed)
Summary: Nadara Eaton letter  [facility]  [address] [place], [place] [address]   Phone: [phone]  Fax: [phone]       01/17/2010 MRN: [id number]  [facility] [address] [place], [place]  [address]  Dear [name],  [facility], and [facility] announce the retirement of Arta Silence, M.D., from full-time practice at the [facility] office effective December 13, 2009 and his plans of returning part-time.  It is important to Dr. Hetty Ely and to our practice that you understand that [facility] has seven physicians in our office for your health care needs.  We will continue to offer the same exceptional care that you have today.    Dr. Hetty Ely has spoken to many of you about his plans for retirement and returning part-time in the fall.   We will continue to work with you through the transition to schedule appointments for you in the office and meet the high standards that [place] is committed to.   Again, it is with great pleasure that we share the news that Dr. Hetty Ely will return to [facility] in October of 2011 with a reduced schedule.    If you have any questions, or would like to request an appointment with one of our physicians, please call us at [phone] and press the option for Scheduling an appointment.  We take pleasure in providing you with excellent patient care and look forward to seeing you at your next office visit.  Our [facility] Physicians are:  Tillman Abide, M.D. Laurita Quint, M.D. Roxy Manns, M.D. Kerby Nora, M.D. Hannah Beat, M.D. Ruthe Mannan, M.D. We proudly welcomed Raechel Ache, M.D. and Eustaquio Boyden, M.D. to the practice in July/August 2011.  Sincerely,  [facility]

## 2010-07-29 ENCOUNTER — Ambulatory Visit: Payer: Self-pay | Admitting: Pulmonary Disease

## 2010-08-15 ENCOUNTER — Encounter: Payer: Self-pay | Admitting: Pulmonary Disease

## 2010-08-15 ENCOUNTER — Ambulatory Visit (INDEPENDENT_AMBULATORY_CARE_PROVIDER_SITE_OTHER): Payer: Medicare Other | Admitting: Pulmonary Disease

## 2010-08-15 DIAGNOSIS — E662 Morbid (severe) obesity with alveolar hypoventilation: Secondary | ICD-10-CM

## 2010-08-15 DIAGNOSIS — J9610 Chronic respiratory failure, unspecified whether with hypoxia or hypercapnia: Secondary | ICD-10-CM

## 2010-08-15 DIAGNOSIS — J438 Other emphysema: Secondary | ICD-10-CM

## 2010-08-27 NOTE — Assessment & Plan Note (Signed)
Summary: rov for copd, osa    Visit Type:  Follow-up Primary Provider/Referring Provider:  Hetty Ely  CC:  OHS.  Emphysema.  CPAP pressure is fine...c/o rash from his mask...no changes better or worse with his breathing.  History of Present Illness: the pt comes in today for f/u of his known emphysema and OHS/OSA.   He feels that he is maintaining his usual baseline wrt doe, and denies any chest congestion or cough with purulence.  He is wearing his cpap compliantly, and denies any issue with mask fit or pressure.  He does note that he is not sleeping well, but part of the issue may be his problem with catnapping during the day.  His weight is essentially unchanged from the last visit.    Preventive Screening-Counseling & Management  Alcohol-Tobacco     Smoking Status: quit     Year Quit: 2001  Current Medications (verified): 1)  Lorazepam 1 Mg Tabs (Lorazepam) .... 1/4 - 1/2  By Mouth Every 6 Hours  As Needed 2)  Proair Hfa 108 (90 Base) Mcg/act Aers (Albuterol Sulfate) .... Inhale 2 Puffs Every 4 To 6 Hours As Needed For Shortness of Breath 3)  Symbicort 160-4.5 Mcg/act Aero (Budesonide-Formoterol Fumarate) .... Inhale 2 Puffs Two Times A Day 4)  Oxygen 4 L 5)  Cpap At Bedtime  Allergies (verified): 1)  ! Macrodantin (Nitrofurantoin Macrocrystal) 2)  ! Celebrex (Celecoxib) 3)  ! Clinoril (Sulindac) 4)  ! Lipitor (Atorvastatin Calcium)  Review of Systems       The patient complains of nasal congestion/difficulty breathing through nose and joint stiffness or pain.  The patient denies shortness of breath with activity, shortness of breath at rest, productive cough, non-productive cough, coughing up blood, chest pain, irregular heartbeats, acid heartburn, indigestion, loss of appetite, weight change, abdominal pain, difficulty swallowing, sore throat, tooth/dental problems, headaches, sneezing, itching, ear ache, anxiety, depression, hand/feet swelling, rash, change in color of mucus,  and fever.    Vital Signs:  Patient profile:   77 year old male Height:      70.5 inches (179.07 cm) Weight:      294.50 pounds (133.86 kg) BMI:     41.81 O2 Sat:      88 % on 2.5 L/min Temp:     98.0 degrees F (36.67 degrees C) oral Pulse rate:   88 / minute BP sitting:   142 / 78  (left arm) Cuff size:   large  Vitals Entered By: Michel Bickers CMA (August 15, 2010 10:06 AM)  O2 Sat at Rest %:  88 O2 Flow:  2.5 L/min  O2 Sat Comments Patients sats rechecked on 2.5L after resting 1-2 minutes and sats were 95%. Pulse was82.Michel Bickers CMA  August 15, 2010 10:08 AM CC: OHS.  Emphysema.  CPAP pressure is fine...c/o rash from his mask...no changes better or worse with his breathing   Physical Exam  General:  obese male in nad  Nose:  no skin breakdown or pressure necrosis from cpap mask.  +erythema on tip of nose Lungs:  decreased bs, faint basilar crackles, no wheezing Heart:  rrr, distant Extremities:  mild edema, no cyanosis  Neurologic:  alert and oriented, moves all 4  does not appear sleepy   Impression & Recommendations:  Problem # 1:  OBESITY HYPOVENTILATION SYNDROME (ICD-278.03) He is wearing cpap compliantly, but feels he doesn't sleep well.  I suspect this is for other reasons, but will recheck pressure needs with auto to make  sure he is at optimal level.  I have stressed to him again the importance of weight loss.  Problem # 2:  EMPHYSEMA (ICD-492.8) the pt appears to be stable from this standpoint.  He is on a good bronchodilator regimen, but may do better with continuous oxygen with consistent exertional activities.  Will try him on the marathon.    Medications Added to Medication List This Visit: 1)  Lorazepam 1 Mg Tabs (Lorazepam) .... 1/4 - 1/2  by mouth every 6 hours  as needed  Other Orders: Est. Patient Level III (04540) DME Referral (DME)  Patient Instructions: 1)  continue same inhalers 2)  work on weight loss and exercise program 3)  will have your  oxygen changed to "marathon".  Use continuous mode during physical activity, can switch back to pulsed otherwise. 4)  will have pressure rechecked on your cpap machine. 5)  followup with me in 6mos

## 2010-09-14 ENCOUNTER — Other Ambulatory Visit: Payer: Self-pay | Admitting: Pulmonary Disease

## 2010-09-14 DIAGNOSIS — E662 Morbid (severe) obesity with alveolar hypoventilation: Secondary | ICD-10-CM

## 2010-09-26 ENCOUNTER — Other Ambulatory Visit: Payer: Self-pay | Admitting: Family Medicine

## 2010-09-26 DIAGNOSIS — R7309 Other abnormal glucose: Secondary | ICD-10-CM

## 2010-09-26 DIAGNOSIS — K5730 Diverticulosis of large intestine without perforation or abscess without bleeding: Secondary | ICD-10-CM

## 2010-09-26 DIAGNOSIS — I10 Essential (primary) hypertension: Secondary | ICD-10-CM

## 2010-09-26 DIAGNOSIS — Z87898 Personal history of other specified conditions: Secondary | ICD-10-CM

## 2010-10-01 ENCOUNTER — Encounter: Payer: Medicare Other | Admitting: Family Medicine

## 2010-10-01 ENCOUNTER — Other Ambulatory Visit (INDEPENDENT_AMBULATORY_CARE_PROVIDER_SITE_OTHER): Payer: Medicare Other | Admitting: Family Medicine

## 2010-10-01 DIAGNOSIS — K5730 Diverticulosis of large intestine without perforation or abscess without bleeding: Secondary | ICD-10-CM

## 2010-10-01 DIAGNOSIS — I10 Essential (primary) hypertension: Secondary | ICD-10-CM

## 2010-10-01 DIAGNOSIS — Z125 Encounter for screening for malignant neoplasm of prostate: Secondary | ICD-10-CM

## 2010-10-01 DIAGNOSIS — Z87898 Personal history of other specified conditions: Secondary | ICD-10-CM

## 2010-10-01 DIAGNOSIS — R7309 Other abnormal glucose: Secondary | ICD-10-CM

## 2010-10-01 LAB — CBC WITH DIFFERENTIAL/PLATELET
Eosinophils Relative: 2.3 % (ref 0.0–5.0)
HCT: 40.1 % (ref 39.0–52.0)
Monocytes Relative: 9.4 % (ref 3.0–12.0)
Neutrophils Relative %: 52.3 % (ref 43.0–77.0)
Platelets: 206 10*3/uL (ref 150.0–400.0)
WBC: 5.5 10*3/uL (ref 4.5–10.5)

## 2010-10-01 LAB — BASIC METABOLIC PANEL
Calcium: 9.2 mg/dL (ref 8.4–10.5)
GFR: 92.13 mL/min (ref >60.00)
Glucose, Bld: 97 mg/dL (ref 70–99)
Sodium: 142 mEq/L (ref 135–145)

## 2010-10-01 LAB — HEPATIC FUNCTION PANEL
ALT: 20 U/L (ref 0–53)
Albumin: 3.7 g/dL (ref 3.5–5.2)
Bilirubin, Direct: 0.3 mg/dL (ref 0.0–0.3)
Total Protein: 6.7 g/dL (ref 6.0–8.3)

## 2010-10-01 LAB — LIPID PANEL
Cholesterol: 198 mg/dL (ref 0–200)
HDL: 44.2 mg/dL (ref >39.00)

## 2010-10-01 LAB — PSA: PSA: 0.23 ng/mL (ref 0.10–4.00)

## 2010-10-01 LAB — MICROALBUMIN / CREATININE URINE RATIO
Microalb Creat Ratio: 3.8 mg/g (ref 0.0–30.0)
Microalb, Ur: 4.7 mg/dL — ABNORMAL HIGH (ref 0.0–1.9)

## 2010-10-05 ENCOUNTER — Other Ambulatory Visit: Payer: Self-pay | Admitting: Adult Health

## 2010-10-08 ENCOUNTER — Ambulatory Visit (INDEPENDENT_AMBULATORY_CARE_PROVIDER_SITE_OTHER): Payer: Medicare Other | Admitting: Family Medicine

## 2010-10-08 ENCOUNTER — Encounter: Payer: Self-pay | Admitting: Family Medicine

## 2010-10-08 VITALS — BP 130/80 | HR 81 | Temp 97.8°F | Ht 71.0 in | Wt 296.0 lb

## 2010-10-08 DIAGNOSIS — R7309 Other abnormal glucose: Secondary | ICD-10-CM

## 2010-10-08 DIAGNOSIS — I10 Essential (primary) hypertension: Secondary | ICD-10-CM

## 2010-10-08 DIAGNOSIS — Z1211 Encounter for screening for malignant neoplasm of colon: Secondary | ICD-10-CM

## 2010-10-08 DIAGNOSIS — Z0000 Encounter for general adult medical examination without abnormal findings: Secondary | ICD-10-CM

## 2010-10-08 DIAGNOSIS — Z0110 Encounter for examination of ears and hearing without abnormal findings: Secondary | ICD-10-CM

## 2010-10-08 DIAGNOSIS — F411 Generalized anxiety disorder: Secondary | ICD-10-CM

## 2010-10-08 DIAGNOSIS — D126 Benign neoplasm of colon, unspecified: Secondary | ICD-10-CM

## 2010-10-08 NOTE — Assessment & Plan Note (Signed)
Improved with weight loss 

## 2010-10-08 NOTE — Assessment & Plan Note (Signed)
UTD on colonscopy. Will order IFOB today.

## 2010-10-08 NOTE — Progress Notes (Signed)
77 yo male with multiple medical problems new to me here for medicare annual wellness visit.  I have personally reviewed the Medicare Annual Wellness questionnaire and have noted 1. The patient's medical and social history HLD- stopped taking Pravachol, worsening his shortness of breath.  Since he stopped taking it, feels much better. LDL is stable at 129.  Has lost 16 pounds in last two years.  Trying to cut back on fatty foods.    Overall, feels better.  Still uses O2 and Cpap at night.  2. Their use of alcohol, tobacco or illicit drugs 3. Their current medications and supplements 4. The patient's functional ability including ADL's, fall risks, home safety risks and hearing or visual             impairment. 5. Diet and physical activities-tries to walk but cannot do much without getting SOB. 6. Evidence for depression or mood disorders- denies any depression.   The PMH, PSH, Social History, Family History, Medications, and allergies have been reviewed in [facility], and have been updated if relevant.  Review of Systems  General:  Complains of fatigue and sweats; denies chills, fever, loss of appetite, malaise, sleep disorder, weakness, and weight loss; chronic . Eyes:  Denies blurring, discharge, double vision, eye irritation, eye pain, halos, itching, light sensitivity, red eye, vision loss-1 eye, and vision loss-both eyes; lots of floaters. ENT:  Denies decreased hearing, difficulty swallowing, ear discharge, earache, hoarseness, nasal congestion, nosebleeds, postnasal drainage, ringing in ears, sinus pressure, and sore throat; water in right ear....congestive sounds.. CV:  Denies bluish discoloration of lips or nails, chest pain or discomfort, difficulty breathing at night, difficulty breathing while lying down, fainting, fatigue, leg cramps with exertion, lightheadness, near fainting, palpitations, shortness of breath with exertion, swelling of feet, swelling of hands, and weight gain. Resp:   denies chest discomfort, chest pain with inspiration, coughing up blood, excessive snoring, hypersomnolence, morning headaches, pleuritic, and sputum productive; chronic. GI:  Denies abdominal pain, bloody stools, change in bowel habits, constipation, dark tarry stools, diarrhea, excessive appetite, gas, hemorrhoids, indigestion, loss of appetite, nausea, vomiting, vomiting blood, and yellowish skin color; occas need to have BM but can't. . GU:  Denies decreased libido, discharge, dysuria, erectile dysfunction, genital sores, hematuria, incontinence, nocturia, urinary frequency, and urinary hesitancy. MS:  Complains of joint pain; throughout. Derm:  Denies changes in color of skin, changes in nail beds, dryness, excessive perspiration, flushing, hair loss, insect bite(s), itching, lesion(s), poor wound healing, and rash. Neuro:   denies brief paralysis, difficulty with concentration, disturbances in coordination, falling down, headaches, inability to speak, memory loss, numbness, poor balance, seizures, sensation of room spinning, tremors, visual disturbances, and weakness; in the fingers or toes since childhood.Marland Kitchen  Physical Exam BP 130/80  Pulse 81  Temp(Src) 97.8 F (36.6 C) (Oral)  Ht 5\' 11"  (1.803 m)  Wt 296 lb (134.265 kg)  BMI 41.28 kg/m2  General:  Well-developed,well-nourished,in no acute distress; alert,appropriate and cooperative throughout examination, morbidly obese. Head:  Normocephalic and atraumatic without obvious abnormalities. No apparent alopecia or balding. Eyes:  Conjunctiva clear bilaterally.  Ears:  External ear exam shows no significant lesions or deformities.  Otoscopic examination reveals clear canals, tympanic membranes are intact bilaterally without bulging, retraction, inflammation or discharge. Hearing is grossly normal bilaterally. Nose:  no skin breakdown or pressure necrosis from cpap mask. nasal passages clear. Mouth:  Oral mucosa and oropharynx without lesions  or exudates.  Teeth in good repair. Neck:  No deformities, masses, or  tenderness noted. Chest Wall:  No deformities, masses, tenderness  noted. Physiologic gynecomastia noted bilat. Breasts:  No masses but gynecomastia noted as above. Lungs:  mildly decreased bs, no wheezing, incr I:E 1:4, worse w/ recumbency. Heart:  Normal rate and regular rhythm. S1 and S2 normal without gallop, click, rub or other extra sounds other than I-II/VI syst murmur left sternal border.. Abdomen:  Bowel sounds positive,abdomen soft and non-tender without masses, organomegaly or hernias noted. Large double panniculous with mild erythema below. Large diagonal choley scar well-healed. Rectal:  No external abnormalities noted. Normal sphincter tone. No rectal masses or tenderness. G neg. Genitalia:  Testes bilaterally descended without nodularity, tenderness or masses. No scrotal masses or lesions. No penis lesions or urethral discharge. Prostate:  Prostate gland firm and smooth, no enlargement, nodularity, tenderness, mass, asymmetry or induration. 20gms. Msk:  No deformity or scoliosis noted of thoracic or lumbar spine.   Pulses:  R and L carotid,radial,femoral,dorsalis pedis and posterior tibial pulses are full and equal bilaterally Extremities:  trace edema bilaterally Neurologic:  No cranial nerve deficits noted. Station and gait are normal. Plantar reflexes are down-going bilaterally. DTRs are symmetrical throughout. Sensory, motor and coordinative functions appear intact. Skin:  Intact without suspicious lesions or rashes except maceration and acalloped erythema below the gynecmastia of the breasts bilat. Cervical Nodes:  No lymphadenopathy noted Inguinal Nodes:  No significant adenopathy Psych:  Cognition and judgment appear intact. Alert and cooperative with normal attention span and concentration. No apparent delusions, illusions, hallucinations

## 2010-10-08 NOTE — Assessment & Plan Note (Signed)
The patients weight, height, BMI and visual acuity have been recorded in the chart I have made referrals, counseling and provided education to the patient based review of the above and I have provided the pt with a written personalized care plan for preventive services.  Reviewed labs with pt and gave him a copy. IFOB ordered today.

## 2010-10-17 NOTE — Progress Notes (Signed)
Addended byMills Koller on: 10/17/2010 11:34 AM   Modules accepted: Orders

## 2010-10-29 NOTE — Assessment & Plan Note (Signed)
[facility]                         GASTROENTEROLOGY OFFICE NOTE   [name] [name], [name] [name] [name]                   MRN:          [id number]  DATE:03/11/2007                            DOB:          27-Mar-1934    PHYSICIAN REQUESING CONSULT:  Arta Silence, M.D.   REASON FOR CONSULTATION:  Constipation, left lower quadrant pain,  changes in bowel habits.   HISTORY OF PRESENT ILLNESS:  The patient is a 77 year old white male  referred through the courtesy of Dr. Hetty Ely.  He relates intermittent  problems with constipation and occasional left lower quadrant discomfort  on and off for many years.  He states that he used Fiber One cereal on a  regular basis for a period of time a few years ago which relieved his  constipation and abdominal pain.  He has felt that medications he takes  worsen his abdominal complaints.  He has recently stopped many  medications and medications are being added back one at a time to assess  for side effects.  Since he restarted Symbicort, he has noted a return  in constipation.  He states he has a bowel movement about every 2-4  days.  He has the urge to defecate and some mild left lower quadrant  crampy abdominal pain.  He does not feel he completely evacuates with  each bowel movement and his stools are hard.  He notes no change in  stool caliber, melena, hematochezia, or weight loss.  No family history  of colon cancer, colon polyps, or inflammatory bowel disease.  He has  not previously had colonoscopy.   PAST MEDICAL HISTORY:  COPD  obstructive sleep apnea on CPAP  arthritis, hyperlipidemia  status post cholecystectomy  status post right knee arthroscopic surgery  status post tonsillectomy and adenoidectomy  kidney stones  chronic back pain  history of peptic ulcer disease and Helicobacter pylori  anxiety   CURRENT MEDICATIONS:  Symbicort one b.i.d.   ALLERGIES:  MACRODANTIN, CLINORIL.   SOCIAL HISTORY:   REVIEW OF SYSTEMS:  Per the handwritten form.   PHYSICAL EXAMINATION:  GENERAL:  Obese white male in no acute distress.  VITAL SIGNS:  Height 5 feet 11 inches, weight 288 pounds, blood pressure  106/60, pulse 68 and regular.  HEENT:  Anicteric sclerae, oropharynx clear.  CHEST:  Decreased breath sounds bilaterally.  HEART:  Regular rate and rhythm without murmurs appreciated.  ABDOMEN:  Large, soft, nontender, and nondistended.  Normal active bowel  sounds.  No palpable organomegaly, masses, or hernias.  RECTAL:  Deferred to time of colonoscopy.  NEUROLOGY:  Alert and oriented x3.  Grossly nonfocal.   ASSESSMENT:  Constipation and left lower quadrant pain.  Rule out  colorectal lesions.  I have advised him to substantially increase his  daily fiber and fluid intake.  He may use a fiber supplement if dietary  fiber is not adequate.  He may use a mild laxative such as Milk of  Magnesia or MiraLax on a daily basis as needed.  Risks, benefits, and  alternatives to colonoscopy with possible biopsy and possible  polypectomy discussed with the patient and  he consents to proceed.  This  will be scheduled electively.     Venita Lick. Russella Dar, MD, [facility]  Electronically Signed    MTS/MedQ  DD: 03/11/2007  DT: 03/11/2007  Job #: [id number]   cc:   Arta Silence, MD

## 2010-11-12 ENCOUNTER — Other Ambulatory Visit: Payer: Medicare Other

## 2010-11-21 ENCOUNTER — Other Ambulatory Visit: Payer: Self-pay | Admitting: Pulmonary Disease

## 2010-11-25 ENCOUNTER — Other Ambulatory Visit: Payer: Self-pay | Admitting: *Deleted

## 2010-11-25 MED ORDER — ALBUTEROL SULFATE HFA 108 (90 BASE) MCG/ACT IN AERS: 2.0000 | INHALATION_SPRAY | RESPIRATORY_TRACT | Status: DC | PRN

## 2010-11-27 NOTE — Telephone Encounter (Signed)
There was no med documented in the refill request. Therefore will sign off on this refill request.

## 2010-12-20 ENCOUNTER — Telehealth: Payer: Self-pay | Admitting: Pulmonary Disease

## 2010-12-20 NOTE — Telephone Encounter (Signed)
Called and spoke with pt and he stated that he is not able to sleep good at night.  Been going on for about 3-4 years.   This has been getting worse in the last year or so.  Pt is requesting something to help him sleep.  Pt is also aware that this may not be addressed until Monday.  Pt is ok with that.  KC please advise. thanks

## 2010-12-23 NOTE — Telephone Encounter (Signed)
Let him know that insomnia is best treated with behavioral therapies rather than medications.  We have discussed the behavioral therapies that he needs to work on in the past.  If these do not work, I usually refer pt's to a psychologist or psychiatrist for "cognitive behavioral therapy", a specialized program for pts with insomnia.   If he would like to discuss further, would set up ov.

## 2010-12-24 NOTE — Telephone Encounter (Signed)
Pt states he is trying to have a designated bedtime, sleeping when he can. States he is fine now and will call back to schedule an appointment when worse.

## 2011-02-20 ENCOUNTER — Ambulatory Visit (INDEPENDENT_AMBULATORY_CARE_PROVIDER_SITE_OTHER): Payer: Medicare Other | Admitting: Pulmonary Disease

## 2011-02-20 ENCOUNTER — Encounter: Payer: Self-pay | Admitting: Pulmonary Disease

## 2011-02-20 VITALS — BP 124/72 | HR 87 | Temp 97.7°F | Ht 70.5 in | Wt 306.2 lb

## 2011-02-20 DIAGNOSIS — J449 Chronic obstructive pulmonary disease, unspecified: Secondary | ICD-10-CM

## 2011-02-20 DIAGNOSIS — E662 Morbid (severe) obesity with alveolar hypoventilation: Secondary | ICD-10-CM

## 2011-02-20 NOTE — Patient Instructions (Signed)
Be sure and continue on oxygen with exertional activities No change in breathing medications make you keep up with mask changes for your CPAP We'll send an order to your DME to instruct you on the use of a heated humidifier Would try chlorpheniramine 4 or 8 mg at bedtime see if it helps your mucus collecting in your throat It is key that you work aggressively on weight loss Followup with me in 6 months

## 2011-02-20 NOTE — Assessment & Plan Note (Signed)
The patient is maintaining on his bronchodilator regimen, and also his oxygen for exertional activities.  He has had no recent acute exacerbation, or pulmonary infection.  I have explained to him that weight loss would help tremendously with his dyspnea on exertion.

## 2011-02-20 NOTE — Progress Notes (Signed)
  Subjective:    Patient ID: [name] [name], male    DOB: 03-22-1934, 77 y.o.   MRN: [id number]  HPI The patient comes in today for followup of his known COPD and obesity hypoventilation syndrome.  He has had difficulty this summer with the high heat and humidity, but it is a little better since the weather has improved.  He denies any chest congestion, cough, or purulent mucus.  He does have mucus collecting in his throat at times it chokes him.  He is unaware if he has postnasal drip.  He has been wearing CPAP compliantly, but continues to have sleep disruption related to chronic pain and also napping that he does during the day.  Unfortunately, his weight has increased 12 pounds since the last visit.   Review of Systems  Constitutional: Negative for fever and unexpected weight change.  HENT: Negative for ear pain, nosebleeds, congestion, sore throat, rhinorrhea, sneezing, trouble swallowing, dental problem, postnasal drip and sinus pressure.   Eyes: Positive for redness and itching.  Respiratory: Positive for cough, chest tightness, shortness of breath and wheezing.   Cardiovascular: Negative for palpitations and leg swelling.  Gastrointestinal: Negative for nausea and vomiting.  Genitourinary: Negative for dysuria.  Musculoskeletal: Negative for joint swelling.  Skin: Negative for rash.  Neurological: Positive for headaches.  Hematological: Bruises/bleeds easily.  Psychiatric/Behavioral: Negative for dysphoric mood. The patient is nervous/anxious.        Objective:   Physical Exam Morbidly obese male in no acute distress No skin breakdown or pressure necrosis from the CPAP mask Chest with decreased breath sounds but no wheezes or rhonchi. Cardiac with regular rate and rhythm Lower extremities with 1-2+ edema, no cyanosis noted Alert and oriented, moves all 4 extremities       Assessment & Plan:

## 2011-02-20 NOTE — Assessment & Plan Note (Signed)
The patient is to continue on his CPAP, and needs to keep up with his supplies and mask changes.  I have also asked him to go back to using his heated humidifier, and will send an order to his DME to instruct him on its use.  This could possibly be the cause of his dryness and mucus collecting in his throat.  I have also stressed the importance of weight loss, and have told him there is little more that we can do if he continues to gain weight.

## 2011-06-05 IMAGING — CR DG ABDOMEN 1V
1 series · 2 of 2 positions shown · non-contrast
Comparison: none

REASON FOR EXAM: kidney stone
COMMENTS:

[Series 1: view not recorded · 0.17mm/px · 2 of 2 slices shown]
[im 1/2]
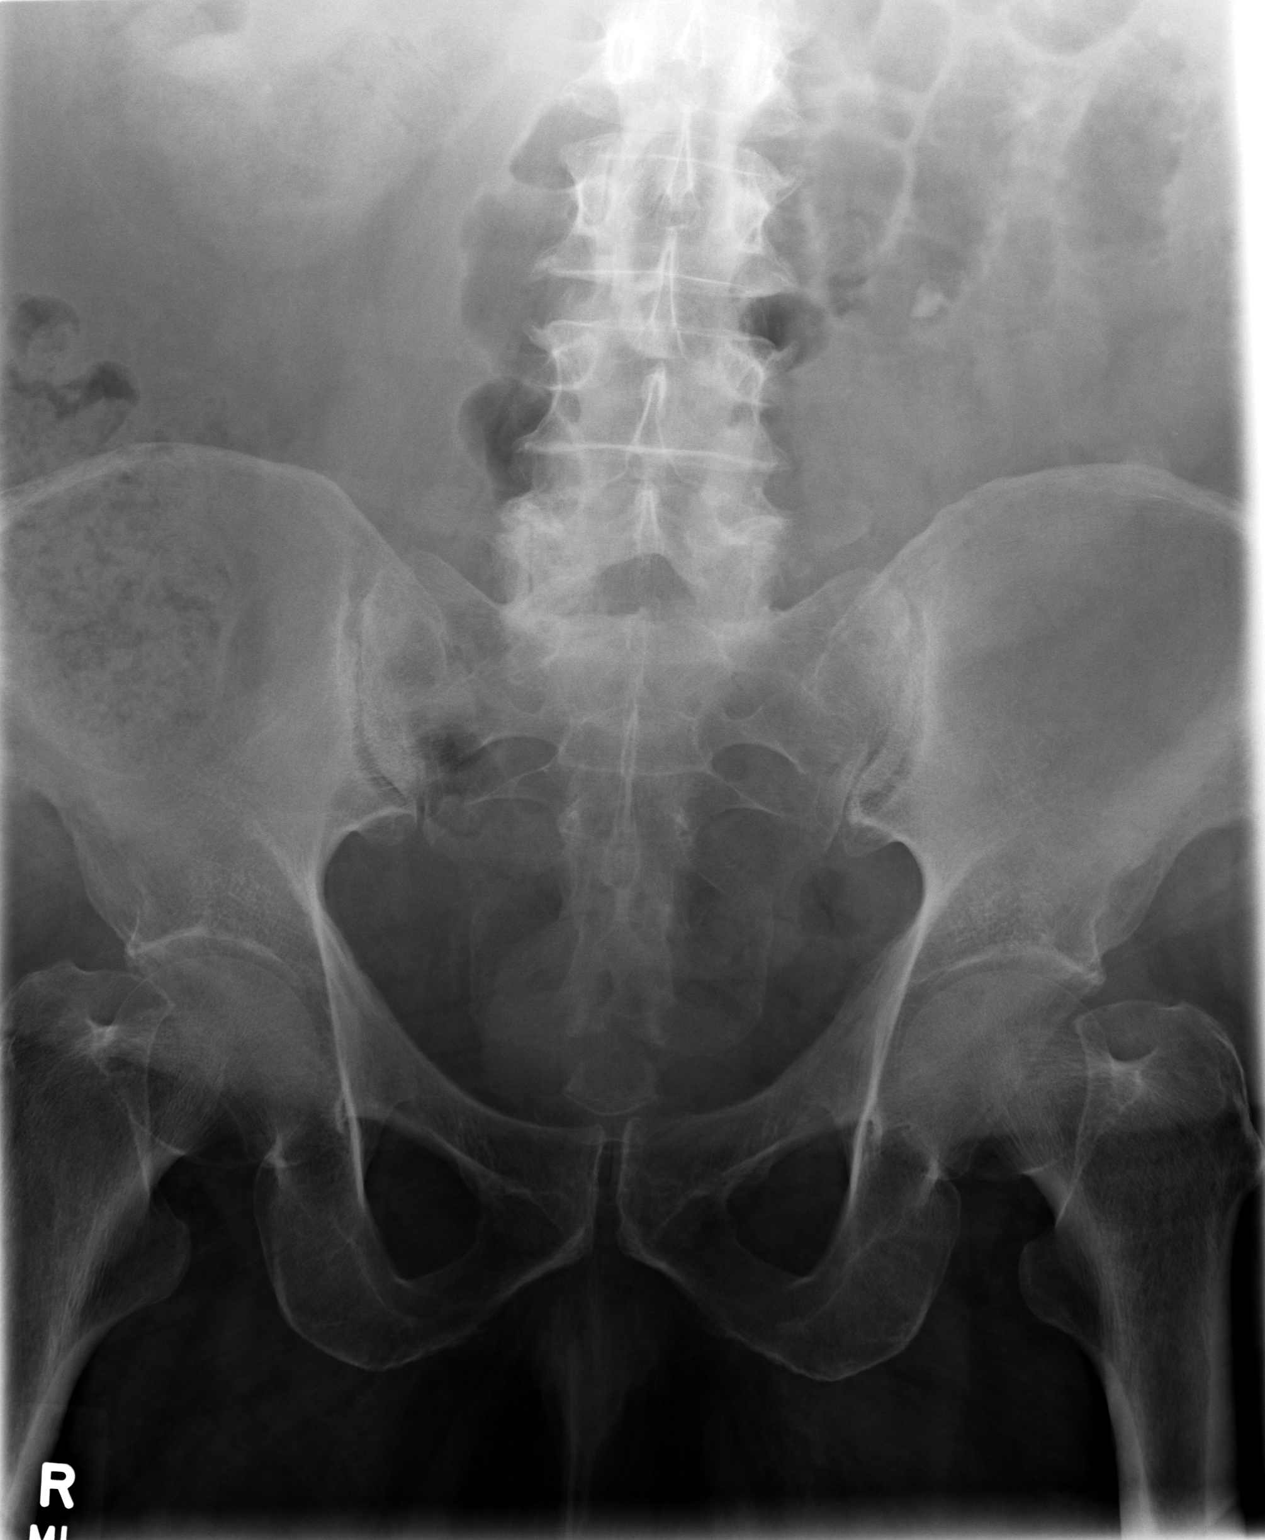
[im 2/2]
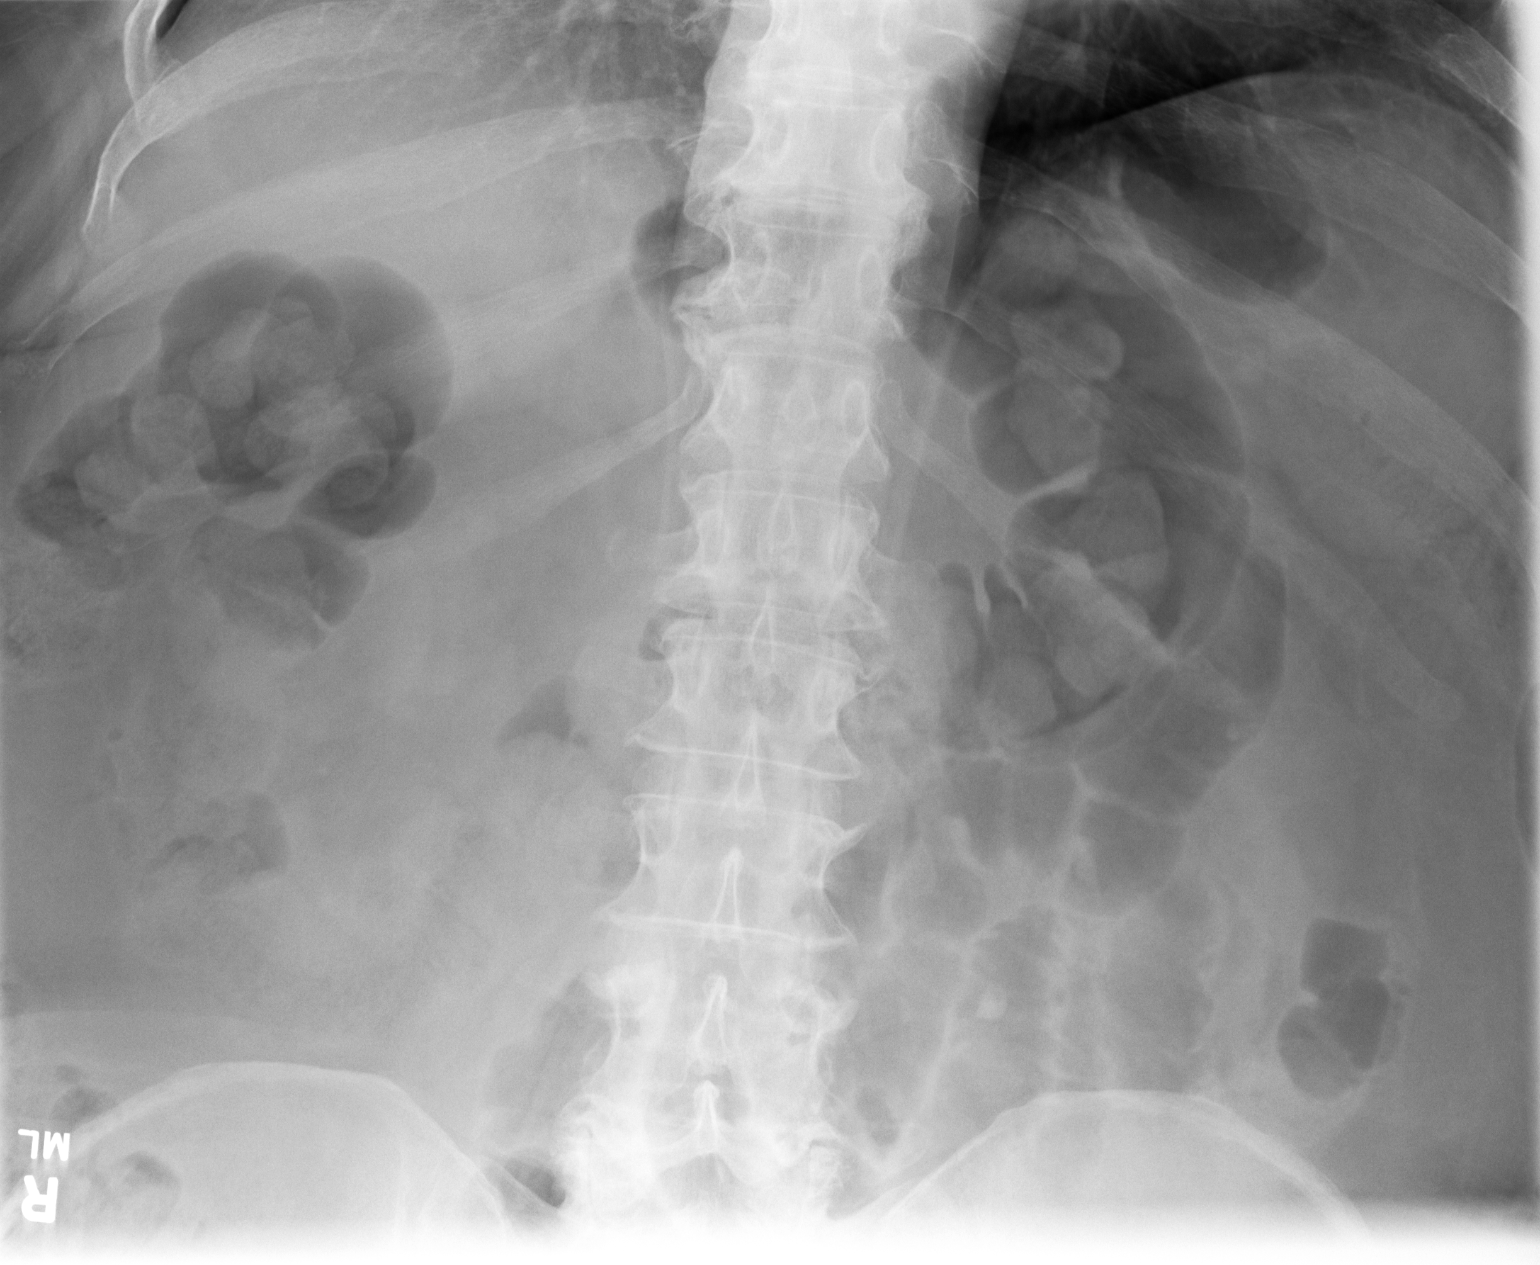

[2 of 2 positions shown; findings below may reference images not displayed]

PROCEDURE:     MDR - MDR KIDNEY URETER BLADDER  - May 31, 2010 [DATE]

RESULT:     There is observed a 9 mm calcification on the left lateral to
the L4 lumbar transverse process. The finding is suspicious for a left
ureteral stone or possibly a stone in a low-lying left kidney. The left
renal outline is not well seen on this exam. There is a faint 2 to 3 mm
density projected over the lower pole of the right kidney suspicious for
right renal stone. No right ureteral stones are noted.
IMPRESSION: 1. Right nephrolithiasis.
2. There is observed a calcification on the left suspicious for a left
ureteral stone or possibly a stone in a low-lying left kidney.
3. Additional stones could be present on either side and obscured by
overlying bowel and bowel content.

## 2011-06-05 IMAGING — CT CT ABD-PELV W/O CM
1 of 2 series · 14 of 32 positions shown, 18 images · non-contrast
Comparison: none

REASON FOR EXAM: kidney stone
COMMENTS:

[Series 2: stonelg 3.0 i40s · axial · 0.94mm/px · z∈[-1179,-735]mm · 14 of 168 slices shown, 18 images]
[im 13/168  soft-tissue]
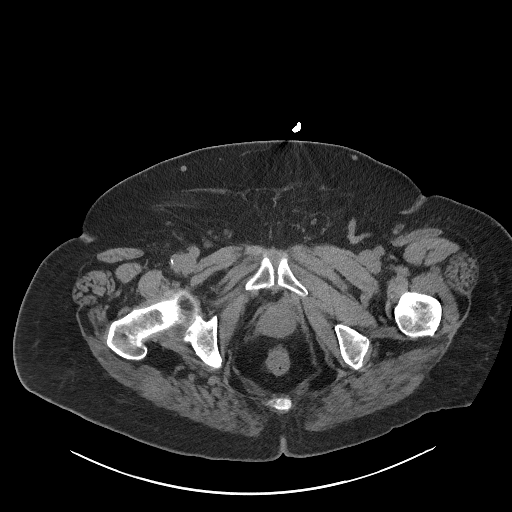
[im 13/168  bone]
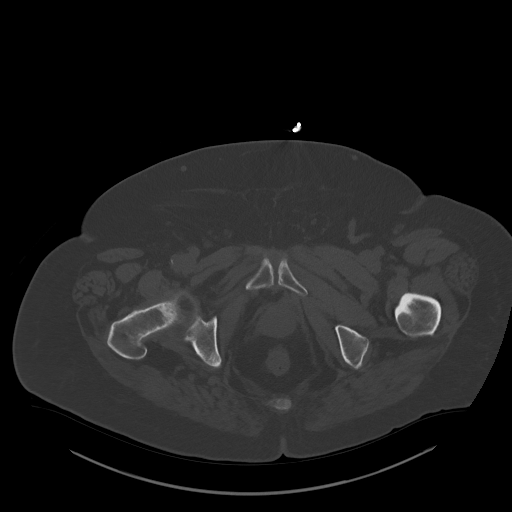
[im 26/168  soft-tissue]
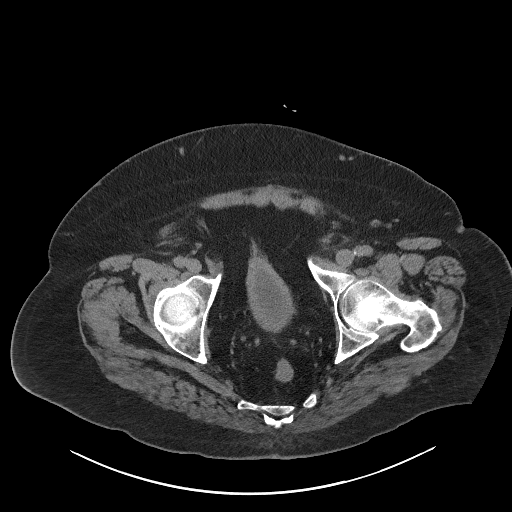
[im 39/168  soft-tissue]
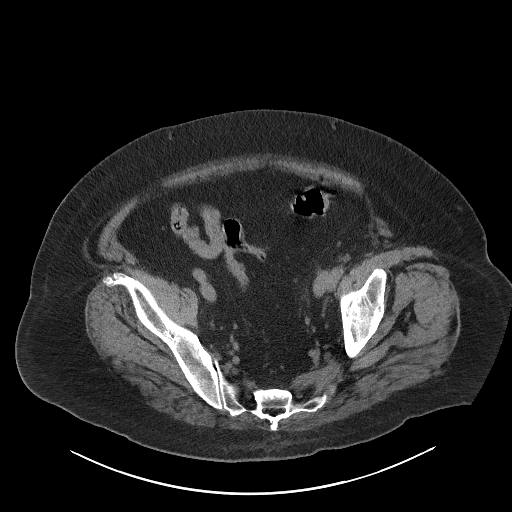
[im 52/168  soft-tissue]
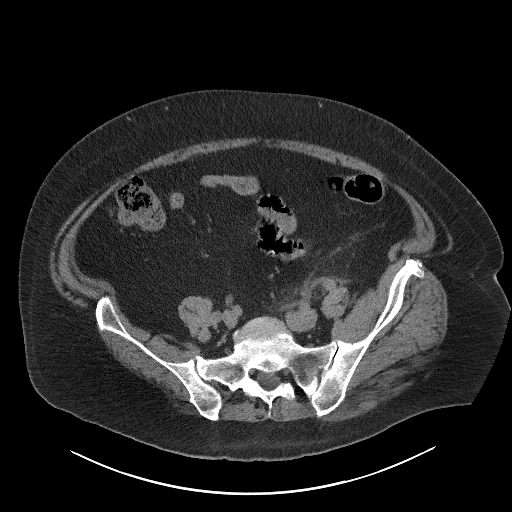
[im 65/168  soft-tissue]
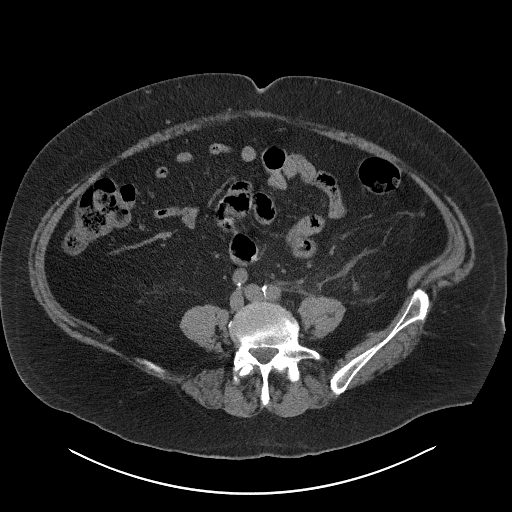
[im 78/168  soft-tissue]
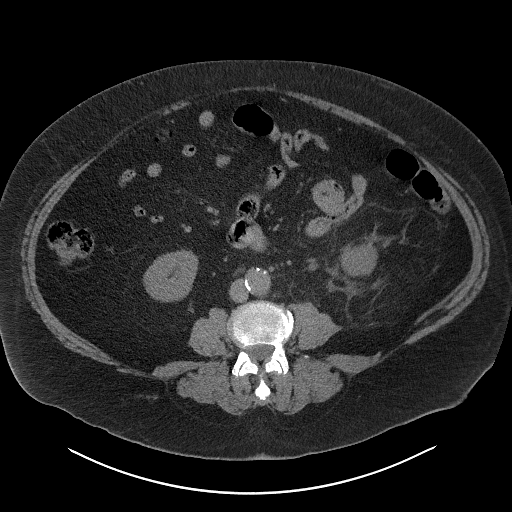
[im 90/168  soft-tissue]
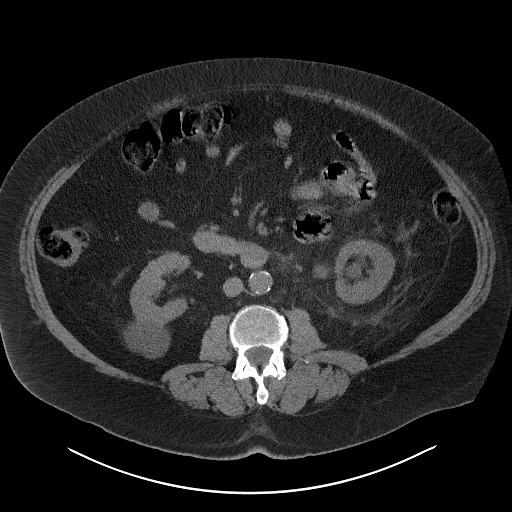
[im 103/168  soft-tissue]
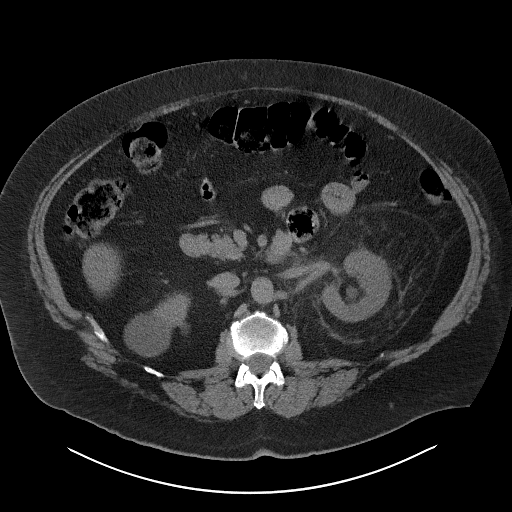
[im 116/168  soft-tissue]
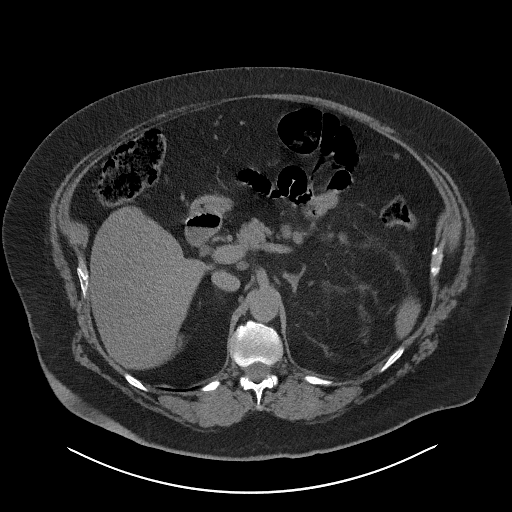
[im 116/168  bone]
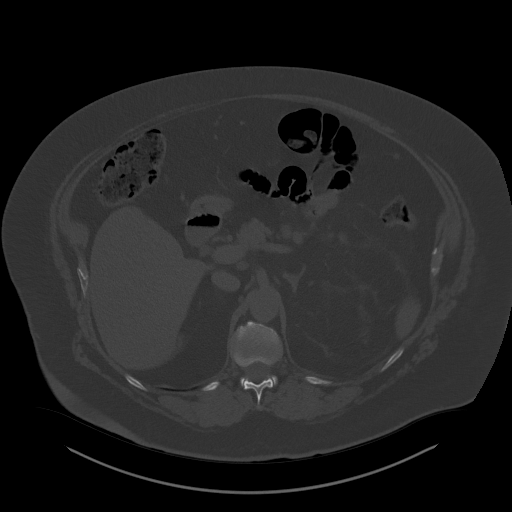
[im 129/168  soft-tissue]
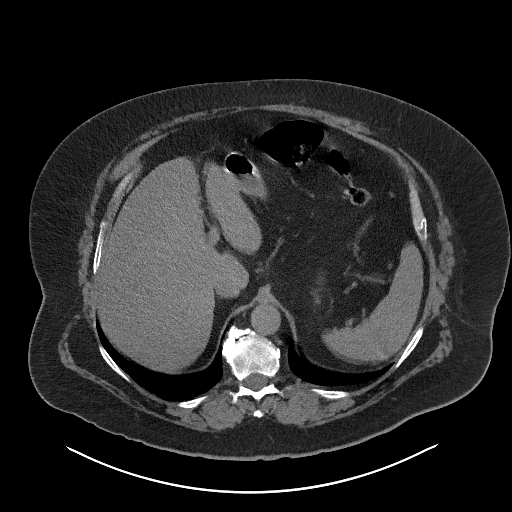
[im 142/168  soft-tissue]
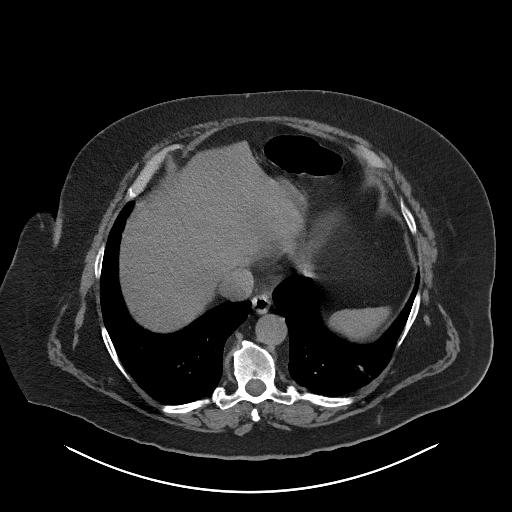
[im 142/168  lung]
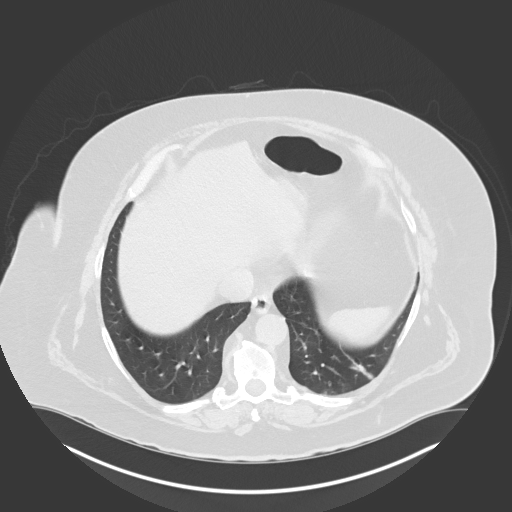
[im 148/168  lung]
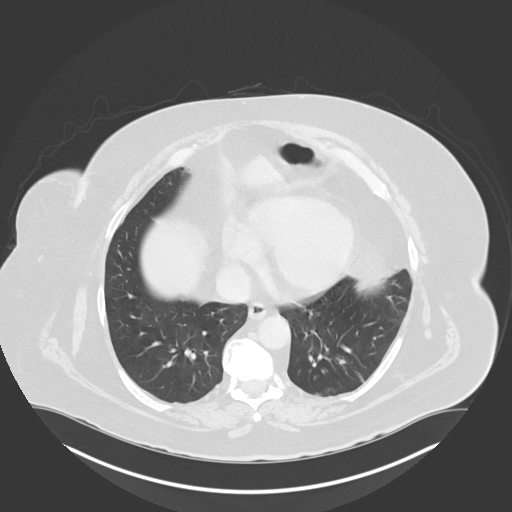
[im 155/168  soft-tissue]
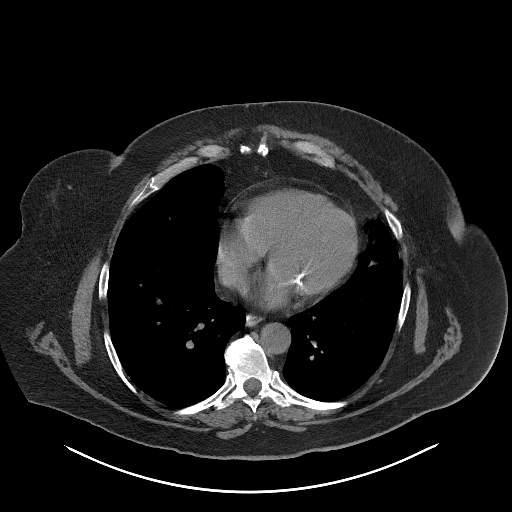
[im 155/168  lung]
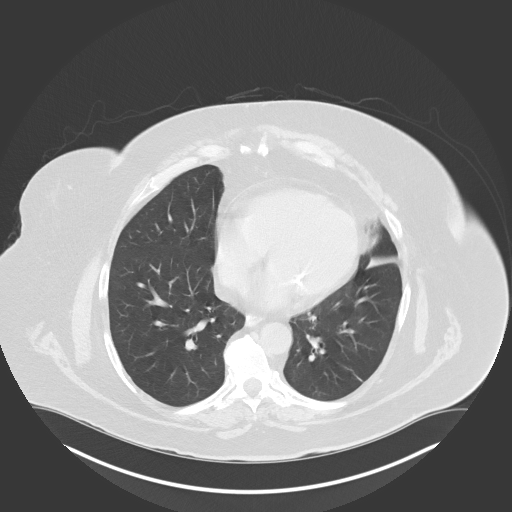
[im 161/168  lung]
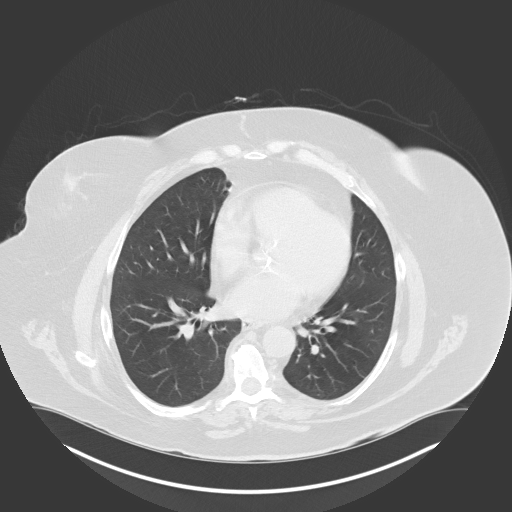

[14 of 32 positions shown; findings below may reference images not displayed]

PROCEDURE:     KCT - KCT ABDOMEN/PELVIS WO  - May 31, 2010  [DATE]

RESULT:     Axial noncontrast CT scanning was performed through the abdomen
and pelvis at 3 mm intervals and slice thicknesses. Review of multiplanar
reconstructed images was performed separately on the VIA monitor.

On the left there is mild hydronephrosis and proximal hydroureter due to a
stone measuring approximately 6 x 7 mm in the proximal left ureter
demonstrated best on image 95. Distal to this I see no stones. There is a
nonobstructing midpole calyceal stone measuring approximately 4 mm in
diameter on the left. There is a nonobstructing 2 mm diameter upper pole
stone. On the right there is correction there are punctate stones in mid
pole calyces and an approximately 2 mm diameter lower pole stone. There is
an exophytic hypodensity measuring 5.9 by 4.8 cm arising from the posterior
aspect of the mid to upper pole of the right kidney. It has Hounsfield
measurement of -8 and likely reflects a cyst. There is a tiny exophytic
hypodensity from the anteromedial aspect of the upper pole of the right
kidney measuring approximately 1.5 cm in diameter with Hounsfield
measurement of minus
11.

The liver, spleen, nondistended stomach, pancreas, and adrenal glands are
normal in appearance. There is failure to taper the caliber of the abdominal
aorta. There is an infrarenal aneurysm measuring 3.2 centimeters in
diameter. It does not involve the common iliac arteries. The unopacified
loops of small and large bowel are normal in appearance. There is no free
fluid in the pelvis. The urinary bladder and prostate gland are normal in
appearance. The lung bases exhibit minimal atelectasis versus scarring on
the left. This will merit followup.
IMPRESSION: 1. There is mild to hydronephrosis and hydroureter due to a stone in the
proximal ureter measuring approximately 6 x 7 mm. There are other
nonobstructing stones in both kidneys. There are also cysts associated with
the right kidney.
2. There is an infrarenal abdominal aortic aneurysm measuring 3.2 cm in
greatest dimension.
3. I do not see acute abnormality elsewhere within the abdomen or pelvis.

## 2011-08-20 ENCOUNTER — Other Ambulatory Visit: Payer: Self-pay | Admitting: Adult Health

## 2011-08-20 MED ORDER — BUDESONIDE-FORMOTEROL FUMARATE 160-4.5 MCG/ACT IN AERO: 2.0000 | INHALATION_SPRAY | Freq: Two times a day (BID) | RESPIRATORY_TRACT | Status: DC

## 2011-08-21 ENCOUNTER — Encounter: Payer: Self-pay | Admitting: Pulmonary Disease

## 2011-08-21 ENCOUNTER — Ambulatory Visit (INDEPENDENT_AMBULATORY_CARE_PROVIDER_SITE_OTHER): Payer: Medicare Other | Admitting: Pulmonary Disease

## 2011-08-21 DIAGNOSIS — E662 Morbid (severe) obesity with alveolar hypoventilation: Secondary | ICD-10-CM

## 2011-08-21 DIAGNOSIS — J449 Chronic obstructive pulmonary disease, unspecified: Secondary | ICD-10-CM

## 2011-08-21 NOTE — Assessment & Plan Note (Signed)
The patient is wearing CPAP compliantly, and in fact has lost 13 pounds since the last visit.  I've asked him to continue working on this.

## 2011-08-21 NOTE — Patient Instructions (Signed)
No change in meds Continue to work on weight loss.  You are doing well Stay on cpap Try mucinex dm extra strength one in am and pm for 3-4 weeks to see if helps with your ability to mobilize mucus. followup with me in 6mos.

## 2011-08-21 NOTE — Assessment & Plan Note (Signed)
The patient has been compliant with symbicort, and feels that it has helped his breathing.  He currently is having an issue with increased congestion, primarily due to poor mucociliary clearance.  I have asked him to try Mucinex for the next 3-4 weeks on a regular basis to see if that helps.  I have also asked him to keep up with his hydration.

## 2011-08-21 NOTE — Progress Notes (Signed)
  Subjective:    Patient ID: [name] [name], male    DOB: Oct 04, 1933, 78 y.o.   MRN: [id number]  HPI The patient comes in today for followup of his known COPD, as well as obesity hypoventilation with sleep apnea.  He has been staying compliant on his CPAP device, and in fact has lost 13 pounds since last visit.  He feels that his breathing is not far from his usual baseline, but is having some issues with thick mucus that is difficult to mobilize.  He feels this is coming from his chest rather than postnasal drip.  He has not tried using Mucinex to see if this will help with viscosity.  He has not seen any purulence, nor has he had any fever.   Review of Systems  Constitutional: Negative for fever and unexpected weight change.  HENT: Positive for nosebleeds, congestion, rhinorrhea, postnasal drip and sinus pressure. Negative for ear pain, sore throat, sneezing, trouble swallowing and dental problem.   Eyes: Negative for redness and itching.  Respiratory: Positive for cough, chest tightness, shortness of breath and wheezing.   Cardiovascular: Positive for leg swelling. Negative for palpitations.  Gastrointestinal: Negative for nausea, vomiting and diarrhea.  Genitourinary: Positive for dysuria.  Musculoskeletal: Negative for joint swelling.  Skin: Positive for rash.  Neurological: Positive for headaches.  Hematological: Bruises/bleeds easily.  Psychiatric/Behavioral: Negative for dysphoric mood. The patient is nervous/anxious.        Objective:   Physical Exam Morbidly obese male in nad Nose without purulence or discharge noted. Chest with a few basilar crackles, decreased bs, no wheezing Cor with rrr, 2/6 sem LE with mild ankle edema, no cyanosis Alert and oriented, moves all 4.        Assessment & Plan:

## 2011-09-23 ENCOUNTER — Telehealth: Payer: Self-pay | Admitting: Family Medicine

## 2011-09-23 NOTE — Telephone Encounter (Signed)
Patient was a patient of Dr.Schaller's.  Patient had his physical done by Dr.Aron last year.  Patient's wife called to schedule his physical and asked if patient could switch to Dr.Duncan.  Patient's wife saw Dr.Schaller at [facility] and asked him who he would recommend they transfer to and he said Dr.Duncan.  Patient's wife said they'd like to see the same doctor. Can patient switch to Dr. Para March?

## 2011-09-24 NOTE — Telephone Encounter (Signed)
Fine with me

## 2011-09-24 NOTE — Telephone Encounter (Signed)
Please schedule the physical with me.

## 2011-09-24 NOTE — Telephone Encounter (Signed)
Okay with me if okay with Dr. Dayton Martes.  He is already listed with me at PCP in EMR.

## 2011-09-24 NOTE — Telephone Encounter (Signed)
Patient notified as instructed by telephone. Patient transferred to [facility] to schedule appointments.

## 2011-10-10 ENCOUNTER — Other Ambulatory Visit: Payer: Self-pay | Admitting: Family Medicine

## 2011-10-10 DIAGNOSIS — I10 Essential (primary) hypertension: Secondary | ICD-10-CM

## 2011-10-10 DIAGNOSIS — Z125 Encounter for screening for malignant neoplasm of prostate: Secondary | ICD-10-CM

## 2011-10-16 ENCOUNTER — Other Ambulatory Visit (INDEPENDENT_AMBULATORY_CARE_PROVIDER_SITE_OTHER): Payer: Medicare Other

## 2011-10-16 DIAGNOSIS — Z125 Encounter for screening for malignant neoplasm of prostate: Secondary | ICD-10-CM

## 2011-10-16 DIAGNOSIS — I10 Essential (primary) hypertension: Secondary | ICD-10-CM

## 2011-10-16 LAB — COMPREHENSIVE METABOLIC PANEL
AST: 21 U/L (ref 0–37)
Albumin: 4 g/dL (ref 3.5–5.2)
Alkaline Phosphatase: 76 U/L (ref 39–117)
Potassium: 4.6 mEq/L (ref 3.5–5.1)
Sodium: 142 mEq/L (ref 135–145)
Total Bilirubin: 1.2 mg/dL (ref 0.3–1.2)
Total Protein: 7.3 g/dL (ref 6.0–8.3)

## 2011-10-16 LAB — LIPID PANEL
LDL Cholesterol: 126 mg/dL — ABNORMAL HIGH (ref 0–99)
VLDL: 22 mg/dL (ref 0.0–40.0)

## 2011-10-20 ENCOUNTER — Encounter: Payer: Self-pay | Admitting: Family Medicine

## 2011-10-20 ENCOUNTER — Ambulatory Visit (INDEPENDENT_AMBULATORY_CARE_PROVIDER_SITE_OTHER): Payer: Medicare Other | Admitting: Family Medicine

## 2011-10-20 ENCOUNTER — Other Ambulatory Visit: Payer: Self-pay | Admitting: Family Medicine

## 2011-10-20 VITALS — BP 126/72 | HR 76 | Temp 98.1°F | Wt 289.0 lb

## 2011-10-20 DIAGNOSIS — Z23 Encounter for immunization: Secondary | ICD-10-CM

## 2011-10-20 DIAGNOSIS — N400 Enlarged prostate without lower urinary tract symptoms: Secondary | ICD-10-CM

## 2011-10-20 DIAGNOSIS — R7309 Other abnormal glucose: Secondary | ICD-10-CM

## 2011-10-20 DIAGNOSIS — J4489 Other specified chronic obstructive pulmonary disease: Secondary | ICD-10-CM

## 2011-10-20 DIAGNOSIS — Z87898 Personal history of other specified conditions: Secondary | ICD-10-CM

## 2011-10-20 DIAGNOSIS — F411 Generalized anxiety disorder: Secondary | ICD-10-CM

## 2011-10-20 DIAGNOSIS — M25569 Pain in unspecified knee: Secondary | ICD-10-CM

## 2011-10-20 DIAGNOSIS — Z1211 Encounter for screening for malignant neoplasm of colon: Secondary | ICD-10-CM

## 2011-10-20 DIAGNOSIS — J449 Chronic obstructive pulmonary disease, unspecified: Secondary | ICD-10-CM

## 2011-10-20 DIAGNOSIS — E662 Morbid (severe) obesity with alveolar hypoventilation: Secondary | ICD-10-CM

## 2011-10-20 MED ORDER — LORAZEPAM 1 MG PO TABS: ORAL_TABLET | ORAL | Status: DC

## 2011-10-20 NOTE — Progress Notes (Signed)
FH prostate cancer.  Defer PSA and rectal this year per patient request.  PSA wnl last year at 0.23.  Consider check next year.  COPD would complicate treatment potentially.  He is aware of risk/benefit or checking/not checking.   Colon cancer screening.  D/w patient [id number] for colon cancer screening, including IFOB vs. colonoscopy.  Risks and benefits of both were discussed and patient voiced understanding.  Pt elects [id number].   R knee pain, asking about injection.  Pain, using a cane. Trouble with steps. Stiff and tender.  See plan.    Hyperglycemia. Mild, he is losing weight intentionally.  Using Diet/portion control.   COPD. On O2 if active and at night.  Occ wheeze, some trouble with chest congestion prev but better with mucinex.   OSA.  Complaint with CPAP.  Doing well with the appliance except for minimal facial rub, and he'll check on mask fit with DME.    Anxiety.  H/o episodic sx now, occ lorazepam use at night.    PMH and SH reviewed  ROS: See HPI, otherwise noncontributory.  Meds, vitals, and allergies reviewed.   nad ncat Mmm rrr Ctab, no wheeze abd soft, normal BS Ext with brawny erythema but not warm- B shin and calf.

## 2011-10-20 NOTE — Patient Instructions (Addendum)
Ask about seeing Dr. Patsy Lager about your knee.   Keep working on your weight.  Come back and see me in 6 months.   I would get a flu shot each fall.

## 2011-10-23 DIAGNOSIS — M25569 Pain in unspecified knee: Secondary | ICD-10-CM | POA: Insufficient documentation

## 2011-10-23 NOTE — Assessment & Plan Note (Signed)
Continue as is, no ADE from meds.

## 2011-10-23 NOTE — Assessment & Plan Note (Signed)
Continue CPAP, he'll check on mask fit.

## 2011-10-23 NOTE — Assessment & Plan Note (Signed)
Continue as is with prn O2.

## 2011-10-23 NOTE — Assessment & Plan Note (Signed)
D/w pt about weight loss.  

## 2011-10-23 NOTE — Assessment & Plan Note (Signed)
With FH prostate cancer but he would likely not want w/u at this point given comorbid conditions. Can readdress later per his request.

## 2011-10-23 NOTE — Assessment & Plan Note (Signed)
[name] [name] loss would help his legs- the leg exam doesn't show acute changes.

## 2011-10-23 NOTE — Assessment & Plan Note (Signed)
I'd like Dr. Cyndie Chime input.

## 2011-10-29 ENCOUNTER — Encounter: Payer: Self-pay | Admitting: Family Medicine

## 2011-10-29 ENCOUNTER — Ambulatory Visit (INDEPENDENT_AMBULATORY_CARE_PROVIDER_SITE_OTHER): Payer: Medicare Other | Admitting: Family Medicine

## 2011-10-29 ENCOUNTER — Ambulatory Visit (INDEPENDENT_AMBULATORY_CARE_PROVIDER_SITE_OTHER)
Admission: RE | Admit: 2011-10-29 | Discharge: 2011-10-29 | Disposition: A | Discharge status: Home / Self Care | Payer: Medicare Other | Source: Ambulatory Visit | Attending: Family Medicine | Admitting: Family Medicine

## 2011-10-29 VITALS — BP 130/72 | HR 83 | Temp 98.0°F | Ht 70.0 in | Wt 291.5 lb

## 2011-10-29 DIAGNOSIS — M25569 Pain in unspecified knee: Secondary | ICD-10-CM

## 2011-10-29 DIAGNOSIS — M1710 Unilateral primary osteoarthritis, unspecified knee: Secondary | ICD-10-CM

## 2011-10-29 DIAGNOSIS — M25561 Pain in right knee: Secondary | ICD-10-CM

## 2011-10-29 DIAGNOSIS — M1711 Unilateral primary osteoarthritis, right knee: Secondary | ICD-10-CM

## 2011-10-29 NOTE — Progress Notes (Signed)
Patient Name: [name] [name] Date of Birth: 03-Mar-1934 Age: 78 y.o. Medical Record Number: [id number] Gender: male Date of Encounter: 10/29/2011  History of Present Illness:  [name] [name] is a 78 y.o. very pleasant male patient who presents with the following:  Patient presents  My courtesy of Dr. Para March with the chief complaint of RIGHT knee pain, with a known history of significant osteoarthritis, status post arthroscopy 20 years ago, with a new presentation approximately one month ago. He was down working on a washing machine, and his knee started to hurt after this. He has had some occasional intermittent swelling. Has been getting better occasionally, but he still has some significant pain occasionally feels like he is going to fall and had give way.  He does have some significant medical problems noted below  Ws behind a washing machine. Has been about a month or so ago. Has been getting better. At times, will feel like he is going to fall. Had a knee arthroscopy about twenty years.   Patient Active Problem List  Diagnoses  . POLYP, COLON  . NEOPLASM, SKIN, UNCERTAIN BEHAVIOR  . MORBID OBESITY  . ANXIETY  . HYPERTENSION, BENIGN ESSENTIAL  . INTERNAL HEMORRHOIDS  . COPD  . CHRONIC RESPIRATORY FAILURE  . PEPTIC ULCER DISEASE WITH H-PYLORI  TX'D  . DIVERTICULOSIS, COLON  . NEPHROLITHIASIS  . BACK PAIN, CHRONIC  . SYMPTOM, SWELLING/MASS/LUMP IN CHEST  . SYMPTOM, PAIN, ABDOMINAL, GENERALIZED  . HYPERGLYCEMIA  . BENIGN PROSTATIC HYPERTROPHY, HX OF  . Obesity hypoventilation syndrome  . Routine general medical examination at a health care facility  . Knee pain   Past Medical History  Diagnosis Date  . OSA on CPAP   . Hyperglycemia   . COPD (chronic obstructive pulmonary disease)   . Anxiety   . Hemorrhoids   . Diverticulosis   . Kidney stones   . PUD (peptic ulcer disease)   . Former smoker    Past Surgical History  Procedure Date  . Knee surgery   .  Cholecystectomy   . Tonsillectomy    History  Substance Use Topics  . Smoking status: Former Smoker -- 1.5 packs/day for 53 years    Types: Cigarettes    Quit date: 06/23/1999  . Smokeless tobacco: Former Neurosurgeon    Types: Snuff, Chew  . Alcohol Use: No   Family History  Problem Relation Age of Onset  . Stroke Mother   . Heart disease Mother   . Prostate cancer Father    Allergies  Allergen Reactions  . Atorvastatin     REACTION: aches  . Celecoxib     REACTION: rash  . Nitrofurantoin   . Sulindac     REACTION: rash   Current Outpatient Prescriptions on File Prior to Visit  Medication Sig Dispense Refill  . albuterol (PROAIR HFA) 108 (90 BASE) MCG/ACT inhaler Inhale 2 puffs into the lungs every 4 (four) hours as needed.  1 Inhaler  11  . budesonide-formoterol (SYMBICORT) 160-4.5 MCG/ACT inhaler Inhale 2 puffs into the lungs 2 (two) times daily.  1 Inhaler  5  . LORazepam (ATIVAN) 1 MG tablet Take 1/4 to 1/2 tablet by mouth every six hours as needed  30 tablet  1     Past Medical History, Surgical History, Social History, Family History, Problem List, Medications, and Allergies have been reviewed and updated if relevant.  Review of Systems:  GEN: No fevers, chills. Nontoxic. Primarily MSK c/o today. MSK: Detailed in the HPI GI:  tolerating PO intake without difficulty Neuro: No numbness, parasthesias, or tingling associated. Otherwise the pertinent positives of the ROS are noted above.    Physical Examination: Filed Vitals:   10/29/11 1109  BP: 130/72  Pulse: 83  Temp: 98 F (36.7 C)  Height: 5\' 10"  (1.778 m)  Weight: 291 lb 8 oz (132.224 kg)  SpO2: 93%    Body mass index is 41.83 kg/(m^2).   GEN: WDWN, NAD, Non-toxic, Alert & Oriented x 3 HEENT: Atraumatic, Normocephalic.  Ears and Nose: No external deformity. EXTR: No clubbing/cyanosis/edema NEURO: Normal gait.  PSYCH: Normally interactive. Conversant. Not depressed or anxious appearing.  Calm demeanor.     Knee:  r Gait: mild antalgia, using cane ROM: loss of 5 deg ext, flexion to 110 Effusion: minimal Echymosis or edema: none Patellar tendon NT Painful PLICA: neg Patellar grind: negative Medial and lateral patellar facet loading: negative medial and lateral joint lines: medial > lateral pain Mcmurray's neg Flexion-pinch neg Varus and valgus stress: stable Lachman: neg Ant and Post drawer: neg Hip abduction, IR, ER: WNL Hip flexion str: 5/5 Hip abd: 5/5 Quad: 5/5 VMO atrophy:mild Hamstring concentric and eccentric: 5/5   Assessment and Plan:  1. Right knee pain  DG Knee Bilateral Standing AP, DG Knee 1-2 Views Right   End-stage right-sided knee arthritis. Probable arthritis flare. The patient could have a meniscal tear additionally, but with his age and advanced arthritic changes, this would not alter management.  For now, we are to try to get him some symptomatic relief. He has some severe pulmonary disease. Unfortunately, think that anything short of a total knee replacement in this gentleman from a surgical standpoint would likely not be all that helpful.  We could try some Synvisc or Elgie Congo if he has continued problems  Knee Injection, R Patient verbally consented to procedure. Risks (including potential rare risk of infection), benefits, and alternatives explained. Sterilely prepped with Chloraprep. Ethyl cholride used for anesthesia. 9 cc Lidocaine 1% mixed with 1 cc of Depo-Medrol 40 mg injected using the anterolateral approach without difficulty. No complications with procedure and tolerated well. Patient had decreased pain post-injection.   Dg Knee 1-2 Views Right  10/29/2011  *RADIOLOGY REPORT*  Clinical Data: Right knee pain  RIGHT KNEE - 1-2 VIEW  Comparison: None  Findings: Lateral and patellar sunrise views are correlated with preceding bilateral AP standing views of the knees. Diffuse joint space narrowing is seen with scattered spur formation. No acute fracture,  dislocation or bone destruction. No knee joint effusion.  IMPRESSION: Degenerative changes right knee. No acute abnormalities.  Original Report Authenticated By: Lollie Marrow, M.D.   Dg Knee Bilateral Standing Ap  10/29/2011  *RADIOLOGY REPORT*  Clinical Data: Right knee pain  BILATERAL KNEES STANDING - 1 VIEW  Comparison: None.  Findings: Bilateral AP standing views performed. Both knees demonstrate osseous demineralization. Medial greater than lateral compartment joint space narrowing bilaterally. No gross fracture or dislocation is seen on AP only views. Soft tissues unremarkable.  IMPRESSION: Osseous demineralization with bilateral degenerative changes of the knees, greatest at medial compartments.  Original Report Authenticated By: Lollie Marrow, M.D.    Orders Today: Orders Placed This Encounter  Procedures  . DG Knee Bilateral Standing AP    Standing Status: Future     Number of Occurrences: 1     Standing Expiration Date: 12/28/2012    Order Specific Question:  Preferred imaging location?    Answer:  [facility]    Order  Specific Question:  Reason for exam:    Answer:  r knee pain  . DG Knee 1-2 Views Right    Standing Status: Future     Number of Occurrences: 1     Standing Expiration Date: 12/28/2012    Order Specific Question:  Preferred imaging location?    Answer:  [facility]    Order Specific Question:  Reason for exam:    Answer:  r knee pain    Medications Today: No orders of the defined types were placed in this encounter.

## 2011-10-31 ENCOUNTER — Encounter: Payer: Self-pay | Admitting: Family Medicine

## 2011-10-31 DIAGNOSIS — M1711 Unilateral primary osteoarthritis, right knee: Secondary | ICD-10-CM

## 2011-10-31 HISTORY — DX: Unilateral primary osteoarthritis, right knee: M17.11

## 2012-01-16 ENCOUNTER — Other Ambulatory Visit: Payer: Self-pay | Admitting: Pulmonary Disease

## 2012-01-16 MED ORDER — ALBUTEROL SULFATE HFA 108 (90 BASE) MCG/ACT IN AERS: 1.0000 | INHALATION_SPRAY | Freq: Four times a day (QID) | RESPIRATORY_TRACT | Status: DC | PRN

## 2012-02-04 ENCOUNTER — Encounter: Payer: Self-pay | Admitting: Gastroenterology

## 2012-02-09 ENCOUNTER — Telehealth: Payer: Self-pay | Admitting: Gastroenterology

## 2012-02-09 NOTE — Telephone Encounter (Signed)
Dr. Brodie will you accept? 

## 2012-02-09 NOTE — Telephone Encounter (Signed)
Dr. Russella Dar do you approve of the change?

## 2012-02-09 NOTE — Telephone Encounter (Signed)
OK with me.

## 2012-02-13 NOTE — Telephone Encounter (Signed)
OK withy me DB

## 2012-02-23 ENCOUNTER — Ambulatory Visit (INDEPENDENT_AMBULATORY_CARE_PROVIDER_SITE_OTHER): Payer: Medicare Other | Admitting: Pulmonary Disease

## 2012-02-23 ENCOUNTER — Encounter: Payer: Self-pay | Admitting: Pulmonary Disease

## 2012-02-23 VITALS — BP 122/72 | HR 94 | Temp 98.0°F | Ht 70.0 in | Wt 294.0 lb

## 2012-02-23 DIAGNOSIS — Z23 Encounter for immunization: Secondary | ICD-10-CM

## 2012-02-23 DIAGNOSIS — J9610 Chronic respiratory failure, unspecified whether with hypoxia or hypercapnia: Secondary | ICD-10-CM

## 2012-02-23 DIAGNOSIS — E662 Morbid (severe) obesity with alveolar hypoventilation: Secondary | ICD-10-CM

## 2012-02-23 DIAGNOSIS — J449 Chronic obstructive pulmonary disease, unspecified: Secondary | ICD-10-CM

## 2012-02-23 MED ORDER — FLUTTER DEVI: Status: DC

## 2012-02-23 NOTE — Assessment & Plan Note (Signed)
The patient has been fairly stable on symbicort and oxygen, but continues to have increased secretions with poor mucociliary clearance.  I would like to try him on a flutter valve, and would also like to try another anticholinergic to see if we can decrease his mucus secretions.  I have asked him to stay on his other therapies, and stressed to him the importance of further weight loss and conditioning.

## 2012-02-23 NOTE — Patient Instructions (Addendum)
Will give you the flu shot today Continue symbicort, but will add tudorza as a trial to see if helps mucus production.  Take one inhalation in am and pm everyday.  Will add flutter valve, and use in am and pm for 3-4 breaths each time. Will send an order to [facility] to see if they will get you a different mask Work on weight loss followup with me in 6mos, but call if tudorza helps and we can call in a prescription.

## 2012-02-23 NOTE — Addendum Note (Signed)
Addended by: Alecia Lemming on: 02/23/2012 09:58 AM   Modules accepted: Orders

## 2012-02-23 NOTE — Assessment & Plan Note (Signed)
The patient is wearing CPAP compliantly, but is having issues with his mask.  I would like to work on a better fit for him, and I have also stressed to him that he will not improve without further weight loss.

## 2012-02-23 NOTE — Progress Notes (Signed)
  Subjective:    Patient ID: [name] [name], male    DOB: 26-Aug-1933, 78 y.o.   MRN: [id number]  HPI Patient comes in today for followup of his known chronic respiratory failure, secondary to COPD, obesity hypoventilation syndrome, and morbid obesity.  He is wearing CPAP compliantly, but is having issues with his mask fit.  He was supposed to get a full facemask, but his DME sent him a nasal mask which has been hurting his nose.  He has been wearing CPAP compliantly, and feels that he sleeps well with the device.  From a breathing standpoint, he feels that he is near his baseline, but continues to have issues with thick mucoid secretions that are difficult to mobilize at times.  He has tried Mucinex, and believes that he has kept his hydration up appropriately.  He has been tried on Spiriva in the past with no change in his breathing.  Perhaps he would benefit from another anticholinergic to see if that will help with his secretions.  He has not had an acute exacerbation her pulmonary infection since his last visit.   Review of Systems  Constitutional: Negative for fever and unexpected weight change.  HENT: Positive for congestion and rhinorrhea. Negative for ear pain, nosebleeds, sore throat, sneezing, trouble swallowing, dental problem, postnasal drip and sinus pressure.   Eyes: Negative for redness and itching.  Respiratory: Positive for cough, chest tightness, shortness of breath and wheezing.   Cardiovascular: Negative for palpitations and leg swelling.  Gastrointestinal: Negative for nausea and vomiting.  Genitourinary: Negative for dysuria.  Musculoskeletal: Negative for joint swelling.  Skin: Negative for rash.  Neurological: Negative for headaches.  Hematological: Bruises/bleeds easily.  Psychiatric/Behavioral: Positive for dysphoric mood. The patient is nervous/anxious.        Objective:   Physical Exam Morbidly obese male in no acute distress nose without purulence or  discharge noted, but mild ear edema noted externally. no skin breakdown or pressure necrosis from the CPAP mask Chest with bibasilar crackles, decreased breath sounds diffusely, no wheezes or rhonchi noted. Cardiac exam with regular rate and rhythm, 3/6 systolic murmur Lower extremities with mild edema, cyanosis or calf tenderness. Alert, does not appear to be sleepy, moves all 4 extremities.        Assessment & Plan:

## 2012-04-22 ENCOUNTER — Ambulatory Visit (INDEPENDENT_AMBULATORY_CARE_PROVIDER_SITE_OTHER): Payer: Medicare Other | Admitting: Family Medicine

## 2012-04-22 ENCOUNTER — Encounter: Payer: Self-pay | Admitting: Family Medicine

## 2012-04-22 VITALS — BP 122/80 | HR 79 | Temp 98.4°F | Wt 291.8 lb

## 2012-04-22 DIAGNOSIS — J449 Chronic obstructive pulmonary disease, unspecified: Secondary | ICD-10-CM

## 2012-04-22 DIAGNOSIS — M25569 Pain in unspecified knee: Secondary | ICD-10-CM

## 2012-04-22 DIAGNOSIS — E662 Morbid (severe) obesity with alveolar hypoventilation: Secondary | ICD-10-CM

## 2012-04-22 MED ORDER — TRIAMCINOLONE ACETONIDE 0.1 % EX CREA: TOPICAL_CREAM | Freq: Two times a day (BID) | CUTANEOUS | Status: DC

## 2012-04-22 NOTE — Progress Notes (Signed)
Still with trouble with nasal CPAP fit with some nasal irritation.  His sleep is disrupted but this is a chronic issue for him that predates CPAP and O2.  Used to work 3rd shift.  Taking ativan at night helps some but he didn't want to increase his PM meds due to his COPD at baseline. He's using flutter valve.    Knee pain is improved after injection and he is grateful for Dr. Cyndie Chime help.   Obesity- he's working on cutting back sweets and portion control.  Weight isn't decreasing quickly.  He wants to continue work on portion control.  We discussed.   Meds, vitals, and allergies reviewed.   ROS: See HPI.  Otherwise, noncontributory.  Obese ncat but mild external nasal irritation noted Tm wnl Nasal exam and OP wnl Neck supple Rrr, murmur noted ctab abd soft, obese Ext with trace edema

## 2012-04-22 NOTE — Patient Instructions (Addendum)
Use the triamcinolone twice a day as needed for nasal irritation.  If it doesn't help, then notify the clinic.  Look up heart.org (american heart association) and read about their diet tips.  Goal weight loss 1-2 lbs per month.  Recheck in 6 months.  Take care.

## 2012-04-23 NOTE — Assessment & Plan Note (Signed)
Would likely benefit from dec in weight.  Discussed at length.  Continue current interventions.

## 2012-04-23 NOTE — Assessment & Plan Note (Signed)
Continue CPAP, use topical TAC prn for the irritation (he knows not to occlude it with the mask- has to apply well before using mask) and work on weight.  He agrees.  >25 min spent with face to face with patient, >50% counseling and/or coordinating care.

## 2012-04-23 NOTE — Assessment & Plan Note (Signed)
Improved, would likely benefit from dec in weight.  Discussed at length.

## 2012-06-03 ENCOUNTER — Other Ambulatory Visit: Payer: Self-pay | Admitting: Adult Health

## 2012-07-01 ENCOUNTER — Encounter: Payer: Self-pay | Admitting: Family Medicine

## 2012-07-01 DIAGNOSIS — R42 Dizziness and giddiness: Secondary | ICD-10-CM | POA: Insufficient documentation

## 2012-08-23 ENCOUNTER — Ambulatory Visit: Payer: Medicare Other | Admitting: Pulmonary Disease

## 2012-08-31 ENCOUNTER — Ambulatory Visit: Payer: Medicare Other | Admitting: Pulmonary Disease

## 2012-09-13 ENCOUNTER — Telehealth: Payer: Self-pay | Admitting: *Deleted

## 2012-09-13 ENCOUNTER — Encounter: Payer: Self-pay | Admitting: Pulmonary Disease

## 2012-09-13 ENCOUNTER — Ambulatory Visit (INDEPENDENT_AMBULATORY_CARE_PROVIDER_SITE_OTHER): Payer: Medicare Other | Admitting: Pulmonary Disease

## 2012-09-13 VITALS — BP 150/90 | HR 72 | Temp 98.5°F | Ht 71.0 in | Wt 296.6 lb

## 2012-09-13 DIAGNOSIS — E662 Morbid (severe) obesity with alveolar hypoventilation: Secondary | ICD-10-CM

## 2012-09-13 DIAGNOSIS — J449 Chronic obstructive pulmonary disease, unspecified: Secondary | ICD-10-CM

## 2012-09-13 DIAGNOSIS — J9610 Chronic respiratory failure, unspecified whether with hypoxia or hypercapnia: Secondary | ICD-10-CM

## 2012-09-13 NOTE — Assessment & Plan Note (Signed)
The patient continues on his current bronchodilator regimen, but has not seen any difference with either LAMA.  His dyspnea on exertion is at his usual baseline, and he has not had an acute exacerbation since last visit.  However, he continues to have issues with mucociliary clearance, and is unsure of how to work his flutter valve.  We'll show him the video today, and I have encouraged him to stay hydrated as well.

## 2012-09-13 NOTE — Telephone Encounter (Signed)
Pt aware of recs per KC   

## 2012-09-13 NOTE — Progress Notes (Signed)
  Subjective:    Patient ID: [name] [name], male    DOB: 1933-11-06, 79 y.o.   MRN: [id number]  HPI Patient comes in today for followup of his chronic respiratory failure secondary to COPD and obesity hypoventilation syndrome.  He is staying on his bronchodilator regimen, but did not see a change with tudorza from the last visit.  He continues to have thick mucus that is difficult to expectorate, and he is unsure how to use his flutter valve.  He has been unable to wear a full face CPAP mask, and he is unhappy with his current nasal mask.  Of note, his weight is up a few pounds from the last visit.   Review of Systems  Constitutional: Negative for fever and unexpected weight change.  HENT: Positive for congestion. Negative for ear pain, nosebleeds, sore throat, rhinorrhea, sneezing, trouble swallowing, dental problem, postnasal drip and sinus pressure.   Eyes: Negative for redness and itching.  Respiratory: Positive for chest tightness and wheezing ( chest congestion). Negative for cough and shortness of breath.   Cardiovascular: Negative for palpitations and leg swelling.  Gastrointestinal: Negative for nausea and vomiting.  Genitourinary: Negative for dysuria.  Musculoskeletal: Negative for joint swelling.  Skin: Negative for rash.  Neurological: Negative for headaches.  Hematological: Does not bruise/bleed easily.  Psychiatric/Behavioral: Negative for dysphoric mood. The patient is nervous/anxious.        Objective:   Physical Exam Morbidly obese male in no acute distress Skin breakdown or pressure necrosis from the CPAP mask Neck without lymphadenopathy or thyromegaly Chest with moderately decreased breath sounds, no wheezing or crackles Cardiac exam with regular rate and rhythm, 2/6 systolic murmur Lower extremities with 1+ edema, no cyanosis Alert and oriented, moves all 4 extremities.       Assessment & Plan:

## 2012-09-13 NOTE — Patient Instructions (Addendum)
Will show you the flutter valve video.  Use 4-5 breaths am and pm each day.  No change in medications Keep your hydration up, and can stay on mucinex am and pm if it helps. Will set up an apptm for a mask fitting at the sleep center.   Work on weight loss.  followup with me in 6mos if remaining stable.

## 2012-09-13 NOTE — Telephone Encounter (Signed)
This should go thru his primary md.

## 2012-09-13 NOTE — Telephone Encounter (Signed)
Patient had some questions about some home equipment. Pt requesting a walker d/t it becoming harder for him to walk and being unsteady on his feet.   Please advise Dr Shelle Iron if this is something that you could order for him or if PCP should order. Thanks.

## 2012-09-13 NOTE — Assessment & Plan Note (Signed)
The patient is remaining compliant on CPAP, but continues to have mask fit issues.  We'll refer him to the sleep center for a formal mask fitting.

## 2012-09-22 ENCOUNTER — Other Ambulatory Visit (HOSPITAL_BASED_OUTPATIENT_CLINIC_OR_DEPARTMENT_OTHER): Payer: Medicare Other

## 2012-09-29 ENCOUNTER — Ambulatory Visit (HOSPITAL_BASED_OUTPATIENT_CLINIC_OR_DEPARTMENT_OTHER): Payer: Medicare Other | Attending: Pulmonary Disease | Admitting: Radiology

## 2012-09-29 DIAGNOSIS — E662 Morbid (severe) obesity with alveolar hypoventilation: Secondary | ICD-10-CM

## 2012-09-29 DIAGNOSIS — Z9989 Dependence on other enabling machines and devices: Secondary | ICD-10-CM

## 2012-10-10 ENCOUNTER — Other Ambulatory Visit: Payer: Self-pay | Admitting: Pulmonary Disease

## 2012-10-10 DIAGNOSIS — E662 Morbid (severe) obesity with alveolar hypoventilation: Secondary | ICD-10-CM

## 2012-10-18 ENCOUNTER — Other Ambulatory Visit: Payer: Medicare Other

## 2012-10-21 ENCOUNTER — Ambulatory Visit: Payer: Medicare Other | Admitting: Family Medicine

## 2012-10-22 ENCOUNTER — Encounter: Payer: Medicare Other | Admitting: Family Medicine

## 2012-11-02 IMAGING — CR DG KNEE 1-2V*R*
2 series · 2 of 2 positions shown · non-contrast
Comparison: None

CLINICAL DATA: Right knee pain

RIGHT KNEE - 1-2 VIEW

[view not recorded (1 of 2)]
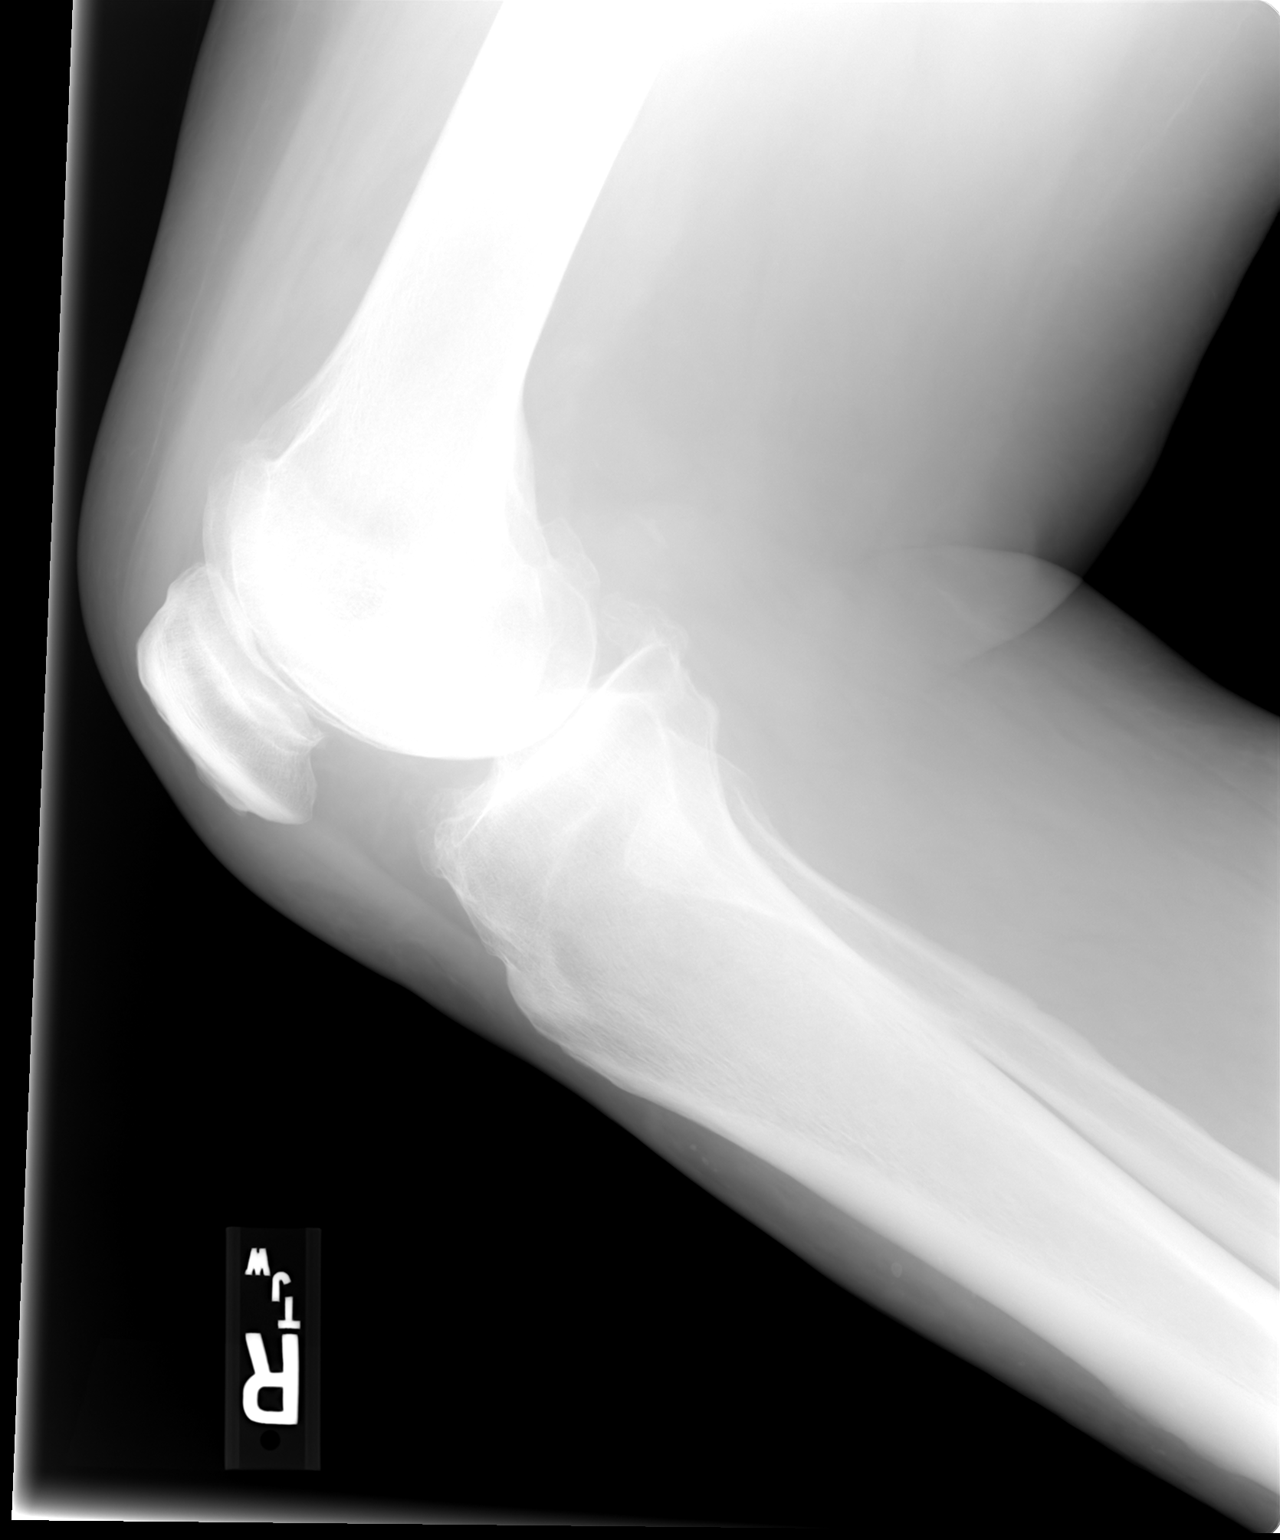

[view not recorded (2 of 2)]
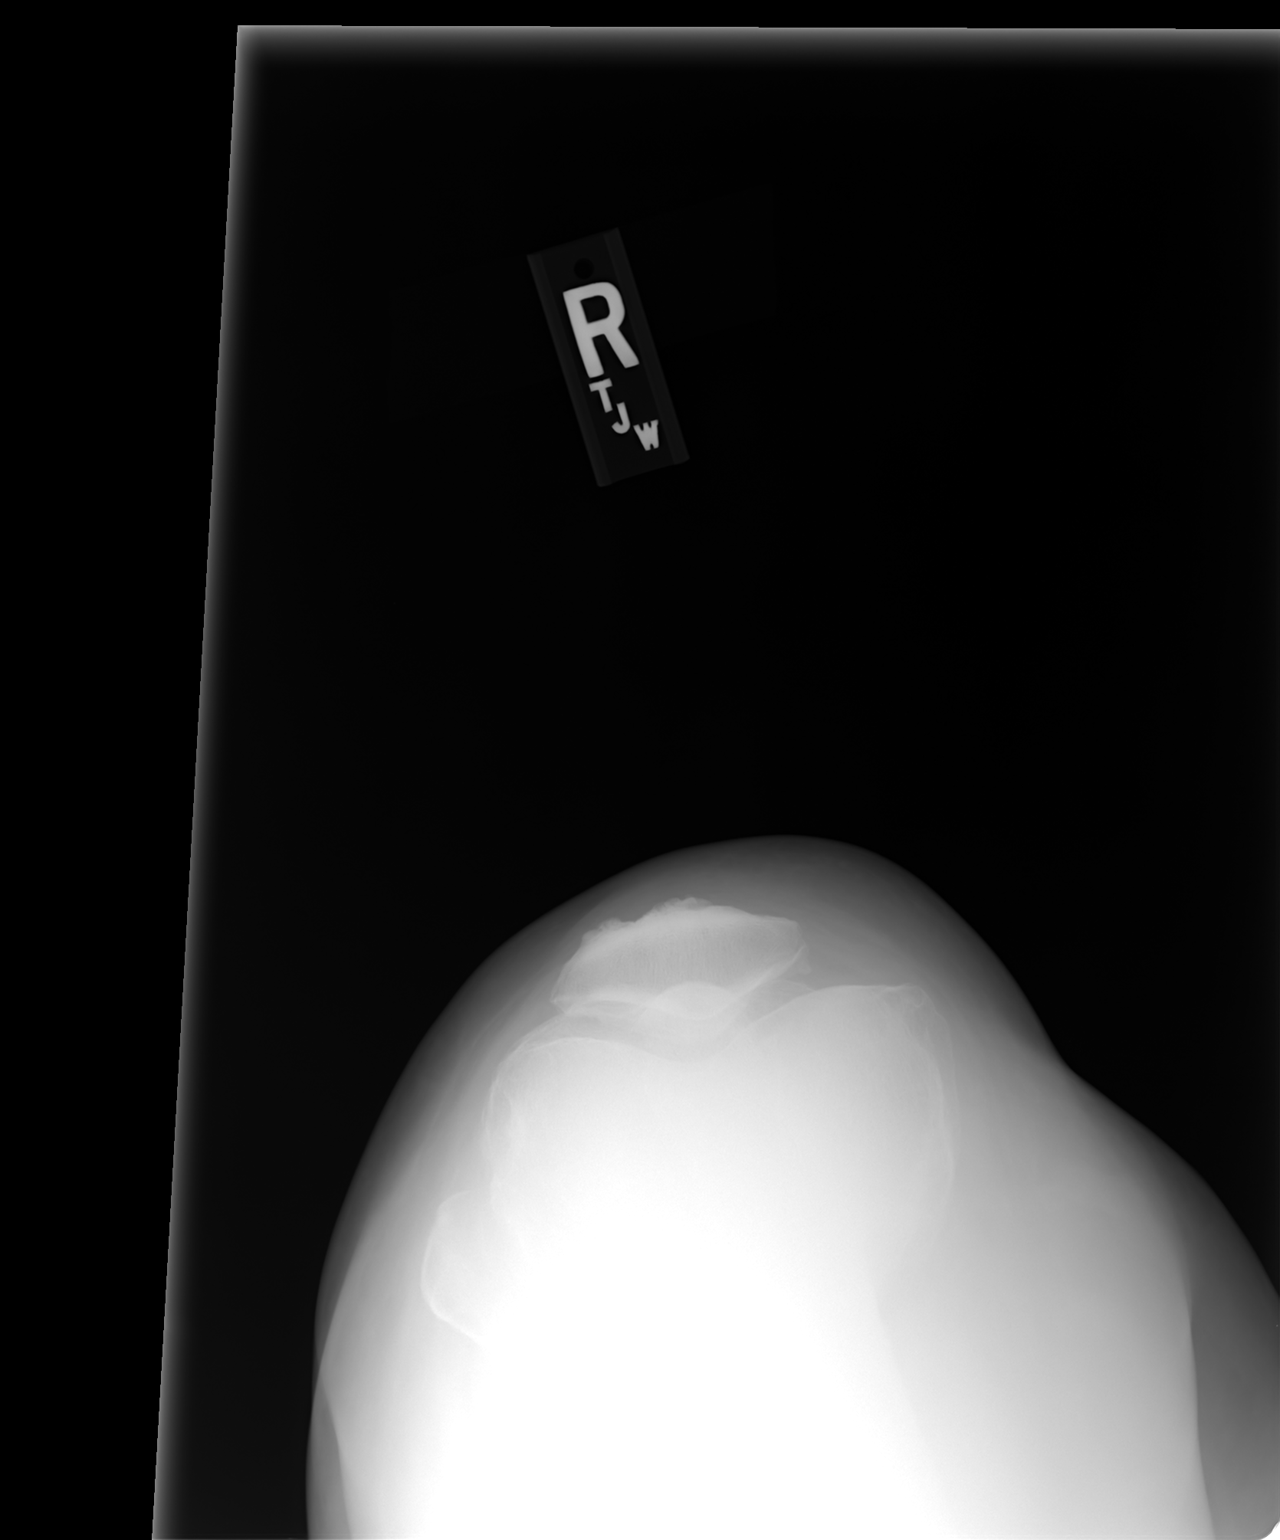

[2 of 2 positions shown; findings below may reference images not displayed]

FINDINGS: Lateral and patellar sunrise views are correlated with preceding
bilateral AP standing views of the knees.
Diffuse joint space narrowing is seen with scattered spur
formation.
No acute fracture, dislocation or bone destruction.
No knee joint effusion.
IMPRESSION: Degenerative changes right knee.
No acute abnormalities.

## 2012-11-02 IMAGING — CR DG KNEE STANDING AP BILAT
1 series · 1 of 1 positions shown · non-contrast
Comparison: None.

CLINICAL DATA: Right knee pain

BILATERAL KNEES STANDING - 1 VIEW

[view not recorded]
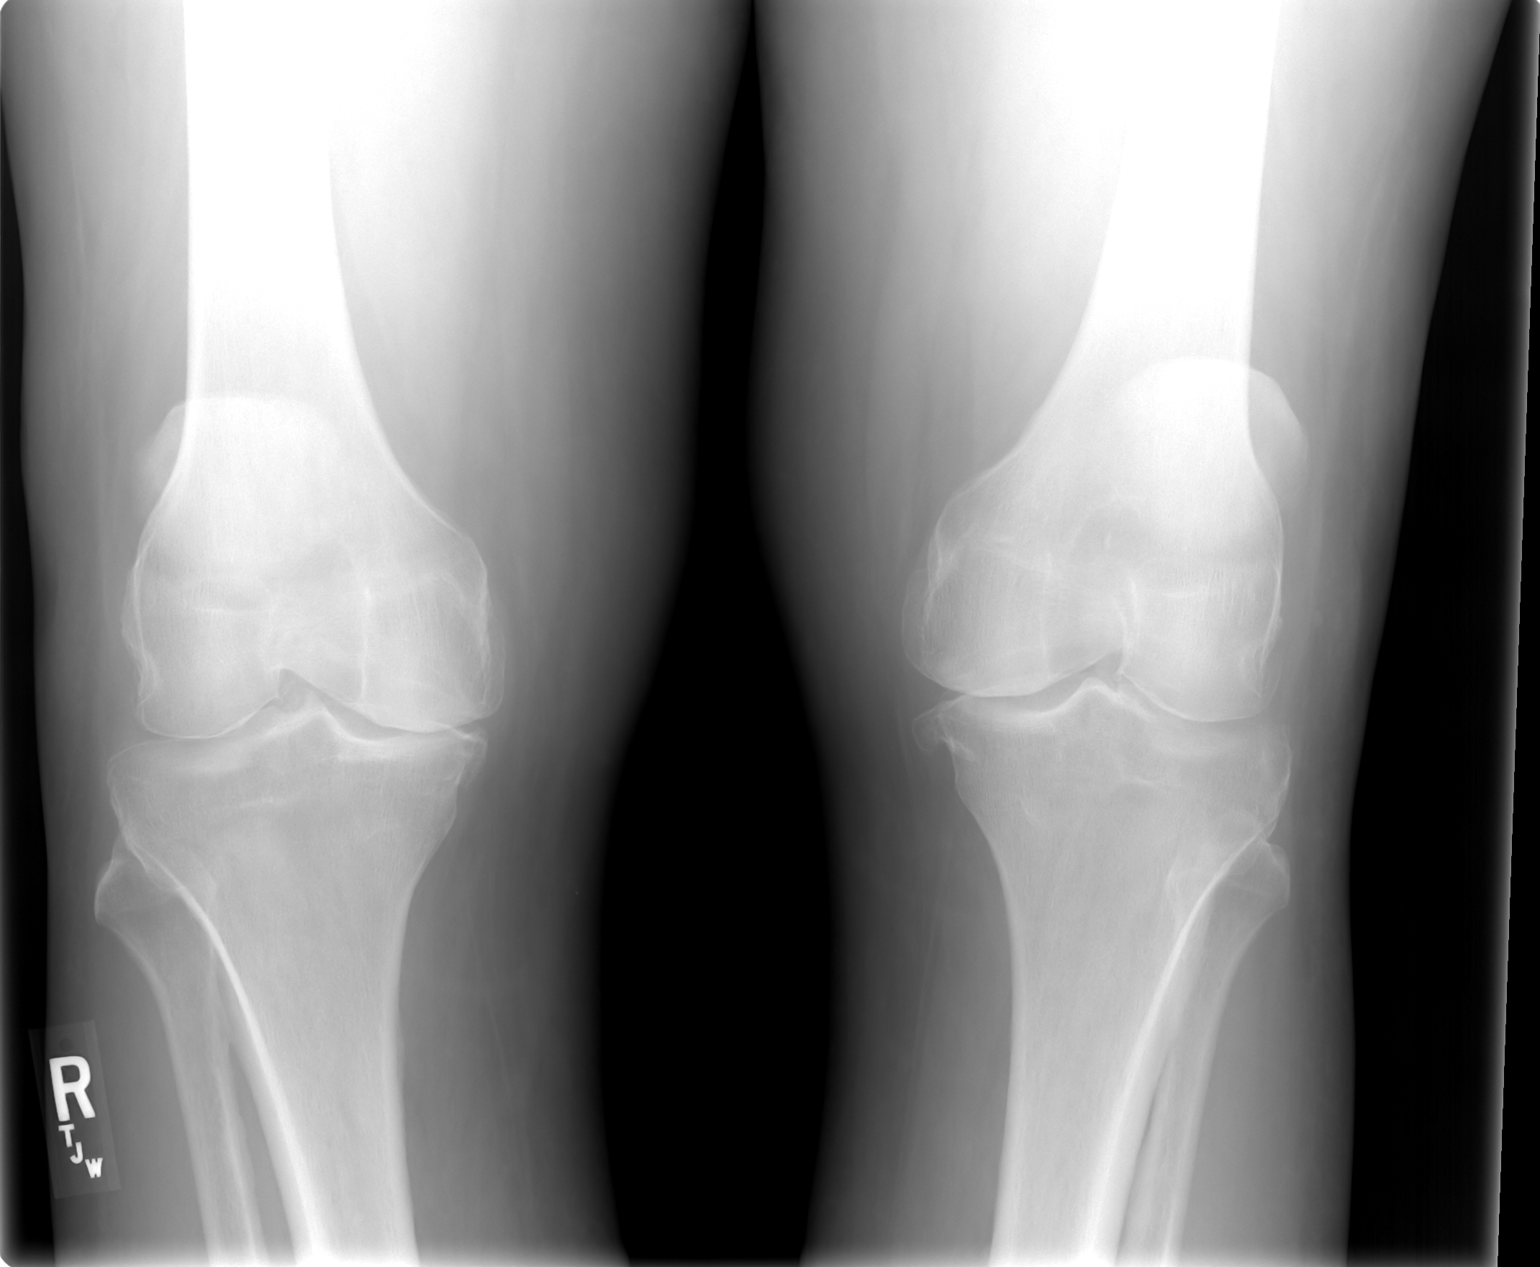

[1 of 1 positions shown; findings below may reference images not displayed]

FINDINGS: Bilateral AP standing views performed.
Both knees demonstrate osseous demineralization.
Medial greater than lateral compartment joint space narrowing
bilaterally.
No gross fracture or dislocation is seen on AP only views.
Soft tissues unremarkable.
IMPRESSION: Osseous demineralization with bilateral degenerative changes of the
knees, greatest at medial compartments.

## 2012-11-09 ENCOUNTER — Other Ambulatory Visit: Payer: Self-pay | Admitting: Family Medicine

## 2012-11-09 NOTE — Telephone Encounter (Signed)
Electronic refill request, Dr. Para March not in office all week, please advise

## 2012-11-09 NOTE — Telephone Encounter (Signed)
plz phone in. 

## 2012-11-10 NOTE — Telephone Encounter (Signed)
Rx called in as directed.   

## 2012-12-03 ENCOUNTER — Other Ambulatory Visit: Payer: Self-pay | Admitting: Family Medicine

## 2012-12-03 DIAGNOSIS — E7800 Pure hypercholesterolemia, unspecified: Secondary | ICD-10-CM

## 2012-12-15 ENCOUNTER — Other Ambulatory Visit: Payer: Medicare Other

## 2012-12-23 ENCOUNTER — Encounter: Payer: Medicare Other | Admitting: Family Medicine

## 2012-12-24 ENCOUNTER — Encounter: Payer: Medicare Other | Admitting: Family Medicine

## 2012-12-28 ENCOUNTER — Other Ambulatory Visit: Payer: Self-pay | Admitting: Adult Health

## 2013-01-17 ENCOUNTER — Encounter: Payer: Self-pay | Admitting: Adult Health

## 2013-01-17 ENCOUNTER — Ambulatory Visit (INDEPENDENT_AMBULATORY_CARE_PROVIDER_SITE_OTHER)
Admission: RE | Admit: 2013-01-17 | Discharge: 2013-01-17 | Disposition: A | Discharge status: Home / Self Care | Payer: Medicare Other | Source: Ambulatory Visit | Attending: Adult Health | Admitting: Adult Health

## 2013-01-17 ENCOUNTER — Ambulatory Visit (INDEPENDENT_AMBULATORY_CARE_PROVIDER_SITE_OTHER): Payer: Medicare Other | Admitting: Adult Health

## 2013-01-17 VITALS — BP 130/82 | HR 90 | Temp 98.4°F | Ht 71.0 in | Wt 282.6 lb

## 2013-01-17 DIAGNOSIS — J449 Chronic obstructive pulmonary disease, unspecified: Secondary | ICD-10-CM

## 2013-01-17 MED ORDER — AZITHROMYCIN 250 MG PO TABS: ORAL_TABLET | ORAL | Status: AC

## 2013-01-17 MED ORDER — PREDNISONE 10 MG PO TABS: ORAL_TABLET | ORAL | Status: DC

## 2013-01-17 MED ORDER — LEVALBUTEROL HCL 0.63 MG/3ML IN NEBU
0.6300 mg | INHALATION_SOLUTION | Freq: Once | RESPIRATORY_TRACT | Status: DC
  Administered 2013-01-17: 0.63 mg via RESPIRATORY_TRACT

## 2013-01-17 NOTE — Addendum Note (Signed)
Addended by: Orma Flaming D on: 01/17/2013 03:43 PM   Modules accepted: Orders

## 2013-01-17 NOTE — Assessment & Plan Note (Signed)
Exacerbation  xopenex neb x 1   Plan  Zpack take as directed  Prednisone taper over next week.  Mucinex DM Twice daily  As needed  Cough/congestion  Please contact office for sooner follow up if symptoms do not improve or worsen or seek emergency care  Wear CPAP At bedtime   Follow up Dr. Shelle Iron as planned and As needed   I will call with chest xray results .

## 2013-01-17 NOTE — Progress Notes (Signed)
  Subjective:    Patient ID: [name] [name], male    DOB: Nov 01, 1933, 79 y.o.   MRN: [id number]  HPI 79 yo male with known hx of chronic respiratory failure secondary to COPD and obesity hypoventilation syndrome.  01/17/2013 Acute OV  Complains of SOB, wheezing, prod cough w green mucus x3 days worse in the AMs-- has not taken any OTC meds. Patient denies any hemoptysis, orthopnea, PND, or increased leg swelling Patient says cough is worse in the morning with thick, green-yellow mucus. He denies any fever, chest pain, palpitations, nausea, vomiting He does have underlying OHS and wears his CPAP each night  Review of Systems Constitutional:   No  weight loss, night sweats,  Fevers, chills,  +fatigue, or  lassitude.  HEENT:   No headaches,  Difficulty swallowing,  Tooth/dental problems, or  Sore throat,                No sneezing, itching, ear ache,  +nasal congestion, post nasal drip,   CV:  No chest pain,  Orthopnea, PND, , anasarca, dizziness, palpitations, syncope.   GI  No heartburn, indigestion, abdominal pain, nausea, vomiting, diarrhea, change in bowel habits, loss of appetite, bloody stools.   Resp:  p of blood.  No change in color of mucus.  No wheezing.  No chest wall deformity  Skin: no rash or lesions.  GU: no dysuria, change in color of urine, no urgency or frequency.  No flank pain, no hematuria   MS:  No joint pain or swelling.  No decreased range of motion.  No back pain.  Psych:  No change in mood or affect. No depression or anxiety.  No memory loss.         Objective:   Physical Exam GEN: A/Ox3; pleasant , NAD, obese   HEENT:  [place]/AT,  EACs-clear, TMs-wnl, NOSE-clear, THROAT-clear, no lesions, no postnasal drip or exudate noted.   NECK:  Supple w/ fair ROM; no JVD; normal carotid impulses w/o bruits; no thyromegaly or nodules palpated; no lymphadenopathy.  RESP  Exp wheezing , speaks in full sentences, no accessory muscle use, no dullness to  percussion  CARD:  RRR, no m/r/g  , tr peripheral edema, pulses intact, no cyanosis or clubbing.  GI:   Soft & nt; nml bowel sounds; no organomegaly or masses detected.  Musco: Warm bil, no deformities or joint swelling noted.   Neuro: alert, no focal deficits noted.    Skin: Warm, no lesions or rashes         Assessment & Plan:

## 2013-01-17 NOTE — Patient Instructions (Addendum)
Zpack take as directed  Prednisone taper over next week.  Mucinex DM Twice daily  As needed  Cough/congestion  Please contact office for sooner follow up if symptoms do not improve or worsen or seek emergency care  Wear CPAP At bedtime   Follow up Dr. Shelle Iron as planned and As needed   I will call with chest xray results .

## 2013-01-18 ENCOUNTER — Telehealth: Payer: Self-pay | Admitting: Pulmonary Disease

## 2013-01-18 NOTE — Telephone Encounter (Signed)
  Result Notes    Notes Recorded by Tommie Sams, CMA on 01/18/2013 at 9:37 AM I spoke with patient about results and he verbalized understanding and had no questions ------  Notes Recorded by Julio Sicks, NP on 01/18/2013 at 9:31 AM cxr is ok, no sign of PNA  Cont w/ ov recs  Please contact office for sooner follow up if symptoms do not improve or worsen or seek emergency care    Spoke with patients spouse made her aware of results as listed above Verbalized understanding and nothing further needed at this time

## 2013-01-29 ENCOUNTER — Ambulatory Visit (HOSPITAL_COMMUNITY)
Admission: RE | Admit: 2013-01-29 | Discharge: 2013-01-29 | Disposition: A | Discharge status: Home / Self Care | Payer: Medicare Other | Source: Ambulatory Visit | Attending: Family Medicine | Admitting: Family Medicine

## 2013-01-29 ENCOUNTER — Ambulatory Visit (INDEPENDENT_AMBULATORY_CARE_PROVIDER_SITE_OTHER): Payer: Medicare Other | Admitting: Family Medicine

## 2013-01-29 ENCOUNTER — Encounter: Payer: Self-pay | Admitting: Family Medicine

## 2013-01-29 VITALS — BP 162/90 | HR 79 | Temp 98.2°F | Resp 20 | Wt 283.0 lb

## 2013-01-29 DIAGNOSIS — W19XXXA Unspecified fall, initial encounter: Secondary | ICD-10-CM | POA: Insufficient documentation

## 2013-01-29 DIAGNOSIS — R0781 Pleurodynia: Secondary | ICD-10-CM

## 2013-01-29 DIAGNOSIS — R079 Chest pain, unspecified: Secondary | ICD-10-CM | POA: Insufficient documentation

## 2013-01-29 DIAGNOSIS — S298XXA Other specified injuries of thorax, initial encounter: Secondary | ICD-10-CM | POA: Insufficient documentation

## 2013-01-29 MED ORDER — IBUPROFEN 800 MG PO TABS: 800.0000 mg | ORAL_TABLET | Freq: Three times a day (TID) | ORAL | Status: DC | PRN

## 2013-01-29 MED ORDER — TRAMADOL HCL 50 MG PO TABS: 50.0000 mg | ORAL_TABLET | Freq: Three times a day (TID) | ORAL | Status: DC | PRN

## 2013-01-29 NOTE — Progress Notes (Signed)
  Subjective:    Patient ID: [name] [name], male    DOB: Apr 07, 1934, 79 y.o.   MRN: [id number]  HPI Rib pain- fell yesterday evening, was attempting to do yard work and fell from standing into a ditch.  Landed on R side.  Shoulder 'seems to be fine'.  But now having rib pain.  Painful to take a deep breath, to cough, to twist.  Uncomfortable to lie down.  Took 2 ibuprofen last night and was able to sleep.   Review of Systems For ROS see HPI     Objective:   Physical Exam  Vitals reviewed. Constitutional: He is oriented to person, place, and time. He appears well-developed and well-nourished.  Uncomfortable w/ movement  Cardiovascular: Intact distal pulses.   Pulmonary/Chest: Effort normal. No respiratory distress.  Scattered expiratory wheezes  Musculoskeletal:  Good ROM of R shoulder w/out pain + TTP over R ribs in mid-axillary line w/out obvious deformity or step-off  Neurological: He is alert and oriented to person, place, and time. Coordination normal.  Skin: Skin is warm and dry.          Assessment & Plan:

## 2013-01-29 NOTE — Patient Instructions (Addendum)
Go to [facility] and get your [employer] the Ibuprofen 3x/day- w/ food (breakfast, lunch, dinner) Use the tramadol as needed for pain- take at night first to see how you respond Alternate heat/ice Call with any questions or concerns Hang in there!

## 2013-01-29 NOTE — Assessment & Plan Note (Signed)
New.  S/p fall while doing yard work.  No gross deformities but will get xray to r/o fx.  Discussed need for scheduled NSAIDs (pt reports taking ibuprofen w/out difficulty despite allergy to celebrex).  Pt very hesitant to take pain meds but did take script for Tramadol to use prn.  Reviewed supportive care and red flags that should prompt return.  Pt expressed understanding and is in agreement w/ plan.

## 2013-02-04 ENCOUNTER — Other Ambulatory Visit: Payer: Medicare Other

## 2013-02-11 ENCOUNTER — Encounter: Payer: Medicare Other | Admitting: Family Medicine

## 2013-03-17 ENCOUNTER — Encounter: Payer: Self-pay | Admitting: Pulmonary Disease

## 2013-03-17 ENCOUNTER — Ambulatory Visit (INDEPENDENT_AMBULATORY_CARE_PROVIDER_SITE_OTHER): Payer: Medicare Other | Admitting: Pulmonary Disease

## 2013-03-17 VITALS — BP 142/88 | HR 79 | Temp 97.1°F | Ht 70.0 in | Wt 282.2 lb

## 2013-03-17 DIAGNOSIS — J449 Chronic obstructive pulmonary disease, unspecified: Secondary | ICD-10-CM

## 2013-03-17 DIAGNOSIS — J9610 Chronic respiratory failure, unspecified whether with hypoxia or hypercapnia: Secondary | ICD-10-CM

## 2013-03-17 DIAGNOSIS — E662 Morbid (severe) obesity with alveolar hypoventilation: Secondary | ICD-10-CM

## 2013-03-17 DIAGNOSIS — J4489 Other specified chronic obstructive pulmonary disease: Secondary | ICD-10-CM

## 2013-03-17 DIAGNOSIS — Z23 Encounter for immunization: Secondary | ICD-10-CM

## 2013-03-17 MED ORDER — PREDNISONE 10 MG PO TABS: ORAL_TABLET | ORAL | Status: DC

## 2013-03-17 NOTE — Patient Instructions (Addendum)
No change in your usual medication. Will start on a short course of prednisone for your noisy breathing today. Stay on cpap, keep up with your mask changes thru your medical equipment company Wear your oxygen in the house when you are active. Continue working on weight loss. followup with me in 6mos.

## 2013-03-17 NOTE — Progress Notes (Signed)
  Subjective:    Patient ID: [name] [name], male    DOB: [birth year], 79 y.o.   MRN: [id number]  HPI Patient comes in today for followup of his known chronic respiratory failure, secondary to COPD and obesity hypoventilation syndrome.  He has found a CPAP mass is more comfortable for him, and he is due for a replacement cushion.  He feels that he is doing well with the device at night while sleeping.  From a breathing standpoint, he has had an acute exacerbation this year and was treated by our nurse practitioner with prednisone and an antibiotic.  He did improve from this, but most recently he has had some increased congestion and shortness of breath.  He is not coughing up purulent mucus currently.  The patient notes that he is having low oxygen saturations during the day when he is active in his house, but he is not wearing his oxygen.   Review of Systems  Constitutional: Negative for fever and unexpected weight change.  HENT: Negative for ear pain, nosebleeds, congestion, sore throat, rhinorrhea, sneezing, trouble swallowing, dental problem, postnasal drip and sinus pressure.   Eyes: Negative for redness and itching.  Respiratory: Negative for cough, chest tightness, shortness of breath and wheezing.   Cardiovascular: Negative for palpitations and leg swelling.  Gastrointestinal: Negative for nausea and vomiting.  Genitourinary: Negative for dysuria.  Musculoskeletal: Negative for joint swelling.  Skin: Negative for rash.  Neurological: Positive for dizziness and light-headedness. Negative for headaches.  Hematological: Does not bruise/bleed easily.  Psychiatric/Behavioral: Negative for dysphoric mood. The patient is not nervous/anxious.        Objective:   Physical Exam Morbidly obese male in no acute distress Nose without purulence or discharge noted No skin breakdown or pressure necrosis from CPAP mask Neck without lymphadenopathy or thyromegaly Chest with basilar crackles, a  few scattered wheezes, and diffuse rhonchi. Cardiac exam with regular rate and rhythm, 2/6 systolic murmur Lower extremities with minimal edema, no cyanosis Lower and oriented, moves all 4 extremities.       Assessment & Plan:

## 2013-03-17 NOTE — Assessment & Plan Note (Signed)
The patient is wearing CPAP compliantly, and has found a mask that is comfortable for him.  He needs to keep up with his new cushions periodically.

## 2013-03-17 NOTE — Addendum Note (Signed)
Addended by: Maisie Fus on: 03/17/2013 11:11 AM   Modules accepted: Orders

## 2013-03-17 NOTE — Assessment & Plan Note (Signed)
The patient has been having increased breathing issues this year, and currently has some mild bronchospasm on exam.  I have stressed to him the importance of staying on symbicort regularly, and we'll treat him with a short course of prednisone today.  I have also stressed the importance of aggressive weight loss and a conditioning program in order to maintain his functional status.  Finally, I have encouraged him to wear his oxygen in the house when he is active.

## 2013-03-31 ENCOUNTER — Other Ambulatory Visit (INDEPENDENT_AMBULATORY_CARE_PROVIDER_SITE_OTHER): Payer: Medicare Other

## 2013-03-31 DIAGNOSIS — I10 Essential (primary) hypertension: Secondary | ICD-10-CM

## 2013-03-31 DIAGNOSIS — R7309 Other abnormal glucose: Secondary | ICD-10-CM

## 2013-03-31 DIAGNOSIS — E7800 Pure hypercholesterolemia, unspecified: Secondary | ICD-10-CM

## 2013-03-31 DIAGNOSIS — Z0000 Encounter for general adult medical examination without abnormal findings: Secondary | ICD-10-CM

## 2013-03-31 LAB — COMPREHENSIVE METABOLIC PANEL
ALT: 17 U/L (ref 0–53)
AST: 17 U/L (ref 0–37)
Albumin: 3.7 g/dL (ref 3.5–5.2)
CO2: 33 mEq/L — ABNORMAL HIGH (ref 19–32)
Calcium: 9.1 mg/dL (ref 8.4–10.5)
Chloride: 101 mEq/L (ref 96–112)
Potassium: 4.4 mEq/L (ref 3.5–5.1)
Total Protein: 7.1 g/dL (ref 6.0–8.3)

## 2013-03-31 LAB — LIPID PANEL: Total CHOL/HDL Ratio: 4

## 2013-04-07 ENCOUNTER — Encounter: Payer: Self-pay | Admitting: Family Medicine

## 2013-04-07 ENCOUNTER — Ambulatory Visit (INDEPENDENT_AMBULATORY_CARE_PROVIDER_SITE_OTHER): Payer: Medicare Other | Admitting: Family Medicine

## 2013-04-07 VITALS — BP 162/90 | HR 79 | Temp 98.0°F | Ht 70.0 in | Wt 285.0 lb

## 2013-04-07 DIAGNOSIS — R7309 Other abnormal glucose: Secondary | ICD-10-CM

## 2013-04-07 DIAGNOSIS — F411 Generalized anxiety disorder: Secondary | ICD-10-CM

## 2013-04-07 DIAGNOSIS — I10 Essential (primary) hypertension: Secondary | ICD-10-CM

## 2013-04-07 DIAGNOSIS — Z0000 Encounter for general adult medical examination without abnormal findings: Secondary | ICD-10-CM

## 2013-04-07 NOTE — Assessment & Plan Note (Signed)
Discussed weigh loss, labs.

## 2013-04-07 NOTE — Assessment & Plan Note (Signed)
D/w pt about weight loss.  

## 2013-04-07 NOTE — Assessment & Plan Note (Signed)
Continue as is with meds.  Rare use of BZD.

## 2013-04-07 NOTE — Assessment & Plan Note (Signed)
See scanned forms.  Routine anticipatory guidance given to patient.  See health maintenance. Flu 2014 Shingles 2010 PNA 2013 Tetanus 2007 Colonoscopy 2008, likely not a candidate for repeat.  Prostate cancer screening and PSA options (with potential risks and benefits of testing vs not testing) were discussed along with recent recs/guidelines.  He declined testing PSA at this point.  He will consider if he would want further eval if PSA were elevated.  Advance directive.  Wife designated if incapacitated.  Cognitive function addressed- see scanned forms- and if abnormal then additional documentation follows.

## 2013-04-07 NOTE — Patient Instructions (Signed)
Take care.  Think about the prostate testing.  If you want it added on, we can do it at a lab visit.  Try to cut back on sweets and work on your weight.  Glad to see you.  See if the pharmacy has any information about a drug assistance program for your inhalers.

## 2013-04-07 NOTE — Progress Notes (Signed)
I have personally reviewed the Medicare Annual Wellness questionnaire and have noted 1. The patient's medical and social history 2. Their use of alcohol, tobacco or illicit drugs 3. Their current medications and supplements 4. The patient's functional ability including ADL's, fall risks, home safety risks and hearing or visual             impairment. 5. Diet and physical activities 6. Evidence for depression or mood disorders  The patients weight, height, BMI have been recorded in the chart and visual acuity is per eye clinic.  I have made referrals, counseling and provided education to the patient based review of the above and I have provided the pt with a written personalized care plan for preventive services.  See scanned forms.  Routine anticipatory guidance given to patient.  See health maintenance. Flu 2014 Shingles 2010 PNA 2013 Tetanus 2007 Colonoscopy 2008, likely not a candidate for repeat.  Prostate cancer screening and PSA options (with potential risks and benefits of testing vs not testing) were discussed along with recent recs/guidelines.  He declined testing PSA at this point.  He will consider if he would want further eval if PSA were elevated.  Advance directive.  Wife designated if incapacitated.  Cognitive function addressed- see scanned forms- and if abnormal then additional documentation follows.   Hyperglycemia.  D/w pt about diet, weight loss. Labs discussed.   COPD per pulm.    PMH and SH reviewed  Meds, vitals, and allergies reviewed.   ROS: See HPI.  Otherwise negative.    GEN: nad, alert and oriented HEENT: mucous membranes moist NECK: supple w/o LA CV: rrr. PULM: ctab but global dec in BS, no inc wob ABD: soft, +bs EXT: no edema SKIN: no acute rash

## 2013-04-07 NOTE — Assessment & Plan Note (Signed)
Mult family members died recently.  Offered condolences. This may have affect his BP. He'll follow at home. Will notify me if persistently elevated.

## 2013-04-08 ENCOUNTER — Encounter: Payer: Self-pay | Admitting: *Deleted

## 2013-05-30 ENCOUNTER — Other Ambulatory Visit: Payer: Self-pay | Admitting: Pulmonary Disease

## 2013-06-20 ENCOUNTER — Ambulatory Visit: Payer: Medicare Other | Admitting: Internal Medicine

## 2013-09-15 ENCOUNTER — Ambulatory Visit: Payer: Medicare Other | Admitting: Pulmonary Disease

## 2013-09-24 ENCOUNTER — Other Ambulatory Visit: Payer: Self-pay | Admitting: Pulmonary Disease

## 2013-09-27 ENCOUNTER — Encounter: Payer: Self-pay | Admitting: Pulmonary Disease

## 2013-09-27 ENCOUNTER — Ambulatory Visit (INDEPENDENT_AMBULATORY_CARE_PROVIDER_SITE_OTHER): Payer: Medicare HMO | Admitting: Pulmonary Disease

## 2013-09-27 ENCOUNTER — Encounter (INDEPENDENT_AMBULATORY_CARE_PROVIDER_SITE_OTHER): Payer: Self-pay

## 2013-09-27 VITALS — BP 110/78 | HR 86 | Temp 97.6°F | Ht 70.0 in | Wt 287.6 lb

## 2013-09-27 DIAGNOSIS — J438 Other emphysema: Secondary | ICD-10-CM

## 2013-09-27 DIAGNOSIS — J9610 Chronic respiratory failure, unspecified whether with hypoxia or hypercapnia: Secondary | ICD-10-CM

## 2013-09-27 DIAGNOSIS — J439 Emphysema, unspecified: Secondary | ICD-10-CM

## 2013-09-27 DIAGNOSIS — E662 Morbid (severe) obesity with alveolar hypoventilation: Secondary | ICD-10-CM

## 2013-09-27 NOTE — Assessment & Plan Note (Signed)
The patient feels he is near his usual baseline from approximately 6 months ago, and has not had an acute exacerbation since his last visit. He continues to have mild cough with nonpurulent mucus. Unfortunately, he has gained another 5 pounds since the last visit, and I have stressed to him the importance of aggressive weight loss and its impact on his quality of life. We can try him on a different LAMA, but the patient did not see a response to Spiriva or tudorza. I really think the weight loss is the key to improvement. He is having some exertional chest tightness on occasion, and I will send a note to his primary physician to consider a cardiac evaluation.

## 2013-09-27 NOTE — Patient Instructions (Addendum)
Work on weight loss.  This is key for quality of life improvement. No change in breathing medications Continue with cpap, and keep up with mask changes and supplies. You need to keep wearing your oxygen with sleep and exertion.  If you are sitting quietly watching tv, ok to stay off oxygen.  You can check your levels periodically, and get back on oxygen if less than 90%.  followup with me again in 77mos.

## 2013-09-27 NOTE — Progress Notes (Signed)
   Subjective:    Patient ID: [name] [name], male    DOB: 1934/02/01, 80 y.o.   MRN: [id number]  HPI The patient comes in today for followup of his known COPD with chronic respiratory failure, as well as OHS/OSA. He is wearing CPAP compliantly, and is not having any issues with his mask or pressure. He continues to have chronic dyspnea on exertion, but feels that it is no different in 6 months ago. He has a mild chronic cough with scant mucus production, and this is unchanged as well. He is having some exertional chest tightness at times, but he downplays his symptoms. His weight has increased 5 pounds since the last visit.   Review of Systems  Constitutional: Negative for fever and unexpected weight change.  HENT: Positive for congestion and nosebleeds. Negative for dental problem, ear pain, postnasal drip, rhinorrhea, sinus pressure, sneezing, sore throat and trouble swallowing.   Eyes: Negative for redness and itching.  Respiratory: Positive for cough, chest tightness and shortness of breath. Negative for apnea and wheezing.   Cardiovascular: Negative for palpitations and leg swelling.  Gastrointestinal: Negative for nausea and vomiting.  Genitourinary: Negative for dysuria.  Musculoskeletal: Negative for joint swelling.  Skin: Negative for rash.  Neurological: Negative for headaches.  Hematological: Does not bruise/bleed easily.  Psychiatric/Behavioral: Negative for dysphoric mood. The patient is not nervous/anxious.        Objective:   Physical Exam Morbidly obese male in no acute distress Nose without purulence or discharge noted No skin breakdown or pressure necrosis from the CPAP mask Neck without lymphadenopathy or thyromegaly Chest with adequate airflow, a few rhonchi, mild upper airway pseudo wheezing being transmitted to the lower lung fields. Cardiac exam distant but with regular rhythm Lower extremities with mild edema, no cyanosis Alert and oriented, moves all 4  extremities.       Assessment & Plan:

## 2013-09-27 NOTE — Assessment & Plan Note (Signed)
The patient is wearing his CPAP compliantly, and is having no issues with his mask fit or pressure.

## 2013-09-28 ENCOUNTER — Telehealth: Payer: Self-pay | Admitting: Family Medicine

## 2013-09-28 NOTE — Telephone Encounter (Signed)
Pt with reported exertional chest tightness at pulmonary appointment.  Please see about getting him in for eval.  8min if possible.  Thanks.

## 2013-09-28 NOTE — Telephone Encounter (Signed)
Spoke to patient and was advised that he has had this chest tightness for a while and feels that it has to do with his COPD. Appointment scheduled as instructed.

## 2013-10-03 ENCOUNTER — Ambulatory Visit (INDEPENDENT_AMBULATORY_CARE_PROVIDER_SITE_OTHER): Payer: Medicare HMO | Admitting: Family Medicine

## 2013-10-03 ENCOUNTER — Encounter: Payer: Self-pay | Admitting: Family Medicine

## 2013-10-03 VITALS — BP 142/80 | HR 96 | Temp 98.1°F | Wt 290.5 lb

## 2013-10-03 DIAGNOSIS — R0789 Other chest pain: Secondary | ICD-10-CM

## 2013-10-03 DIAGNOSIS — J439 Emphysema, unspecified: Secondary | ICD-10-CM

## 2013-10-03 DIAGNOSIS — J438 Other emphysema: Secondary | ICD-10-CM

## 2013-10-03 NOTE — Assessment & Plan Note (Signed)
His sx seem to be more related to COPD and not a true cardiac source. EKG w/o acute changes.  No CP now.  Would continue as is with COPD treatment.  He agrees.  See instructions.  >25 minutes spent in face to face time with patient, >50% spent in counselling or coordination of care.

## 2013-10-03 NOTE — Patient Instructions (Signed)
It does appear that your symptoms are coming from COPD.  If your symptoms worsen, meaning more exertional chest tightness, then notify me.  Thanks.  Take care.

## 2013-10-03 NOTE — Progress Notes (Signed)
Pre visit review using our clinic review tool, if applicable. No additional management support is needed unless otherwise documented below in the visit note.  He is having some exertional chest tightness at times, rarely.  He reports it more chest tightness in the midst of clearing his throat/clearing sputum.  Chest tightness has been going on chronically, this isn't a new issue. Still getting SOB, using O2 prn, more O2 use gradually over the last year. No recent L arm pain, no jaw pain, but he does have some muscular shoulder pain likely from walking with his walker. He has neck muscle tightness.  Sputum is usually clear.  No fevers.    Recently with harder stools. Tried mult OTC meds.  Tried ex-lax w/o much relief.  Daily hard BMs.  Sensation of fecal urgency. He has "false alarms" for a BM. No blood in stool.  Fiber supplement only helped a little.   Meds, vitals, and allergies reviewed.   ROS: See HPI.  Otherwise, noncontributory.  nad ncat Neck supple but with some paraspinal muscle tightness B No midline pain rrr with murmur noted, old finding Ctab, on O2 abd soft Ext with 1+ edema and chronic BLE skin changes.

## 2013-12-15 ENCOUNTER — Telehealth: Payer: Self-pay | Admitting: Family Medicine

## 2013-12-15 NOTE — Telephone Encounter (Signed)
Patient Information:  Caller Name: [name] [name]  Phone: [phone]  Patient: [name] [name], [name] [name]  Gender: Male  DOB: [birth year]  Age: 80 Years  PCP: [name] Brigitte Pulse) [facility])  Office Follow Up:  Does the office need to follow up with this patient?: No  Instructions For The Office: N/A  RN Note:  Rash gets red with itching and turns into "sores." No appointments remain at [facility] or [place] office for 12/15/13.  Declined 1345 appointment at [facility] office with Dr Maudie Mercury due to fear she may not be on [facility] and does not want to drive that far.  Reported he'd rather make appointment for future date with Dr Damita Dunnings.  Transferred to [place]/ office scheduler who scheduled for 12/19/13 with Dr Damita Dunnings.  Symptoms  Reason For Call & Symptoms: Generalized papular rash with pruritus for "a couple of months."  After itching area, then welts appear.  Began on shoulders spread to back and abdomen.  Rash is only under clothing.  Reviewed Health History In EMR: Yes  Reviewed Medications In EMR: Yes  Reviewed Allergies In EMR: Yes  Reviewed Surgeries / Procedures: Yes  Date of Onset of Symptoms: Unknown  Treatments Tried: lotion, aquafor, Gold Bond powder, Zeasorb powder, anti-itch lotion, alcohol, antihistamine  Treatments Tried Worked: No  Guideline(s) Used:  Rash or Redness - Widespread  Disposition Per Guideline:   See Today in Office  Reason For Disposition Reached:   Severe itching  Advice Given:  Reassurance:  There are many causes of widespread rashes and most of the time they are not serious. Common causes include viral illness (e.g., cold viruses) and allergic reactions (to a food, medicine, or environmental exposure).  Here is some care advice that should help.  Oatmeal Aveeno Bath for Itching:  Sprinkle contents of one Aveeno packet under running faucet with comfortably warm water. Bathe for 15 - 20 minutes, 1-2 times daily.  Pat dry with a towel. Do not rub the  rash.  Oral Antihistamine Medication for Itching:  Take an antihistamine like diphenhydramine (Benadryl) for widespread rashes that itch. The adult dosage of Benadryl is 25-50 mg by mouth 4 times daily.  An over-the-counter antihistamine that causes less sleepiness is loratadine (e.g., Alavert or Claritin).  CAUTION: This type of medication may cause sleepiness. Do not drink alcohol, drive, or operate dangerous machinery while taking antihistamines. Do not take these medications if you have prostate problems.  Call Back If:   You become worse  Patient Refused Recommendation:  Patient Refused Appt, Patient Requests Appt At Later Date  Declined to be seen at another office due to unsure if [facility] covers another MD and distance to other office.

## 2013-12-15 NOTE — Telephone Encounter (Signed)
If present for a few weeks or longer, then I'll see him early next week. Thanks.

## 2013-12-15 NOTE — Telephone Encounter (Signed)
No answer at contact number.  Left detailed message on voicemail of cell phone.

## 2013-12-19 ENCOUNTER — Encounter: Payer: Self-pay | Admitting: Family Medicine

## 2013-12-19 ENCOUNTER — Ambulatory Visit (INDEPENDENT_AMBULATORY_CARE_PROVIDER_SITE_OTHER): Payer: Medicare HMO | Admitting: Family Medicine

## 2013-12-19 VITALS — BP 142/78 | HR 80 | Temp 98.2°F | Wt 279.5 lb

## 2013-12-19 DIAGNOSIS — R238 Other skin changes: Secondary | ICD-10-CM

## 2013-12-19 DIAGNOSIS — L989 Disorder of the skin and subcutaneous tissue, unspecified: Secondary | ICD-10-CM

## 2013-12-19 DIAGNOSIS — Z9181 History of falling: Secondary | ICD-10-CM

## 2013-12-19 MED ORDER — LORAZEPAM 1 MG PO TABS: ORAL_TABLET | ORAL | Status: DC

## 2013-12-19 MED ORDER — TRIAMCINOLONE ACETONIDE 0.5 % EX CREA: 1.0000 "application " | TOPICAL_CREAM | Freq: Two times a day (BID) | CUTANEOUS | Status: DC

## 2013-12-19 NOTE — Patient Instructions (Signed)
Use triamcinolone on the itchy areas.  When better, change to regular skin cream to keep your skin from getting dry.  Take care.

## 2013-12-19 NOTE — Progress Notes (Signed)
Pre visit review using our clinic review tool, if applicable. No additional management support is needed unless otherwise documented below in the visit note.  Rare use of BZD for anxiety.  Needs refill, not filled frequently.  PRN use, no ADE.   Rash. Itches.  B back and shoulders.  Some on the abdomen. Can start with mild itching, "I'll scratch it a little, and then more, and then clawing down to the bone."  Started about 2-3 months ago. It will erupt in patches, episodically, would wax and wane. Using rubbing alcohol on the areas.  No fevers.  No new detergents or soaps. No clear triggers known.  Other than the itching, he doesn't feet poorly.  Intentional weight loss with diet changes.  He feels better about that.   Meds, vitals, and allergies reviewed.   ROS: See HPI.  Otherwise, noncontributory.  On O2 nad ncat Global dec in BS but no focal dec rrr Upper back with B patches of blanching excoriated erythema, similar patch noted on the anterior chest.  Not dermatomal.

## 2013-12-20 DIAGNOSIS — R238 Other skin changes: Secondary | ICD-10-CM | POA: Insufficient documentation

## 2013-12-20 DIAGNOSIS — Z9181 History of falling: Secondary | ICD-10-CM | POA: Insufficient documentation

## 2013-12-20 NOTE — Assessment & Plan Note (Signed)
H/o fall, has to carry O2 tank and use a cane.  Would likely be steadier with a regular walker.  Hand written rx done for patient.  He'll check on DME.

## 2013-12-20 NOTE — Assessment & Plan Note (Signed)
Likely dry skin, with itch/scratch cycle.  Use TAC to disrupt the cycle, try to avoid scratching.  Doesn't look infected.  F/u prn.  He agrees.

## 2014-01-20 ENCOUNTER — Telehealth: Payer: Self-pay

## 2014-01-20 NOTE — Telephone Encounter (Signed)
V/M left requesting rx for walker. Call pt when walker prescription done. Pt was seen 12/19/13 and walker discussed,.

## 2014-01-22 IMAGING — CR DG CHEST 2V
2 series · 2 of 2 positions shown · non-contrast
Comparison: Chest x-ray of 07/24/2008

CLINICAL DATA: Short of breath, former smoking history

CHEST - 2 VIEW

[view not recorded (1 of 2)]
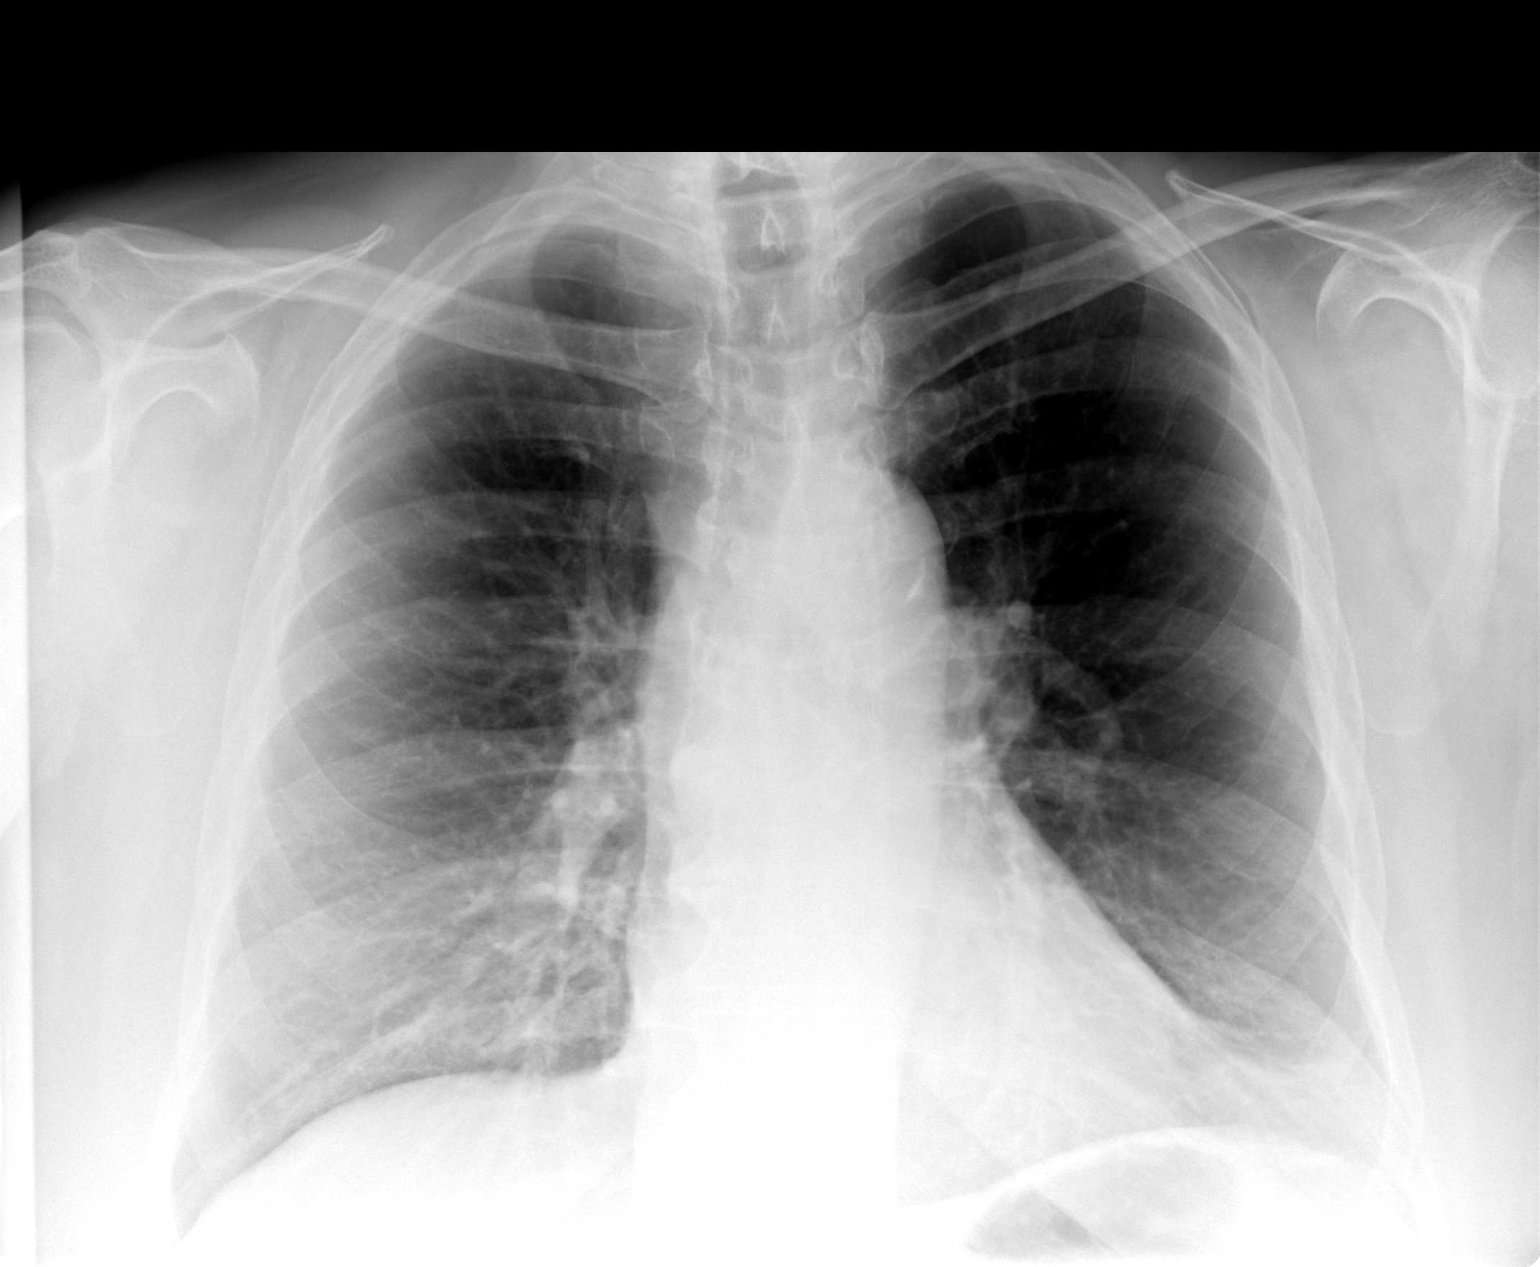

[view not recorded (2 of 2)]
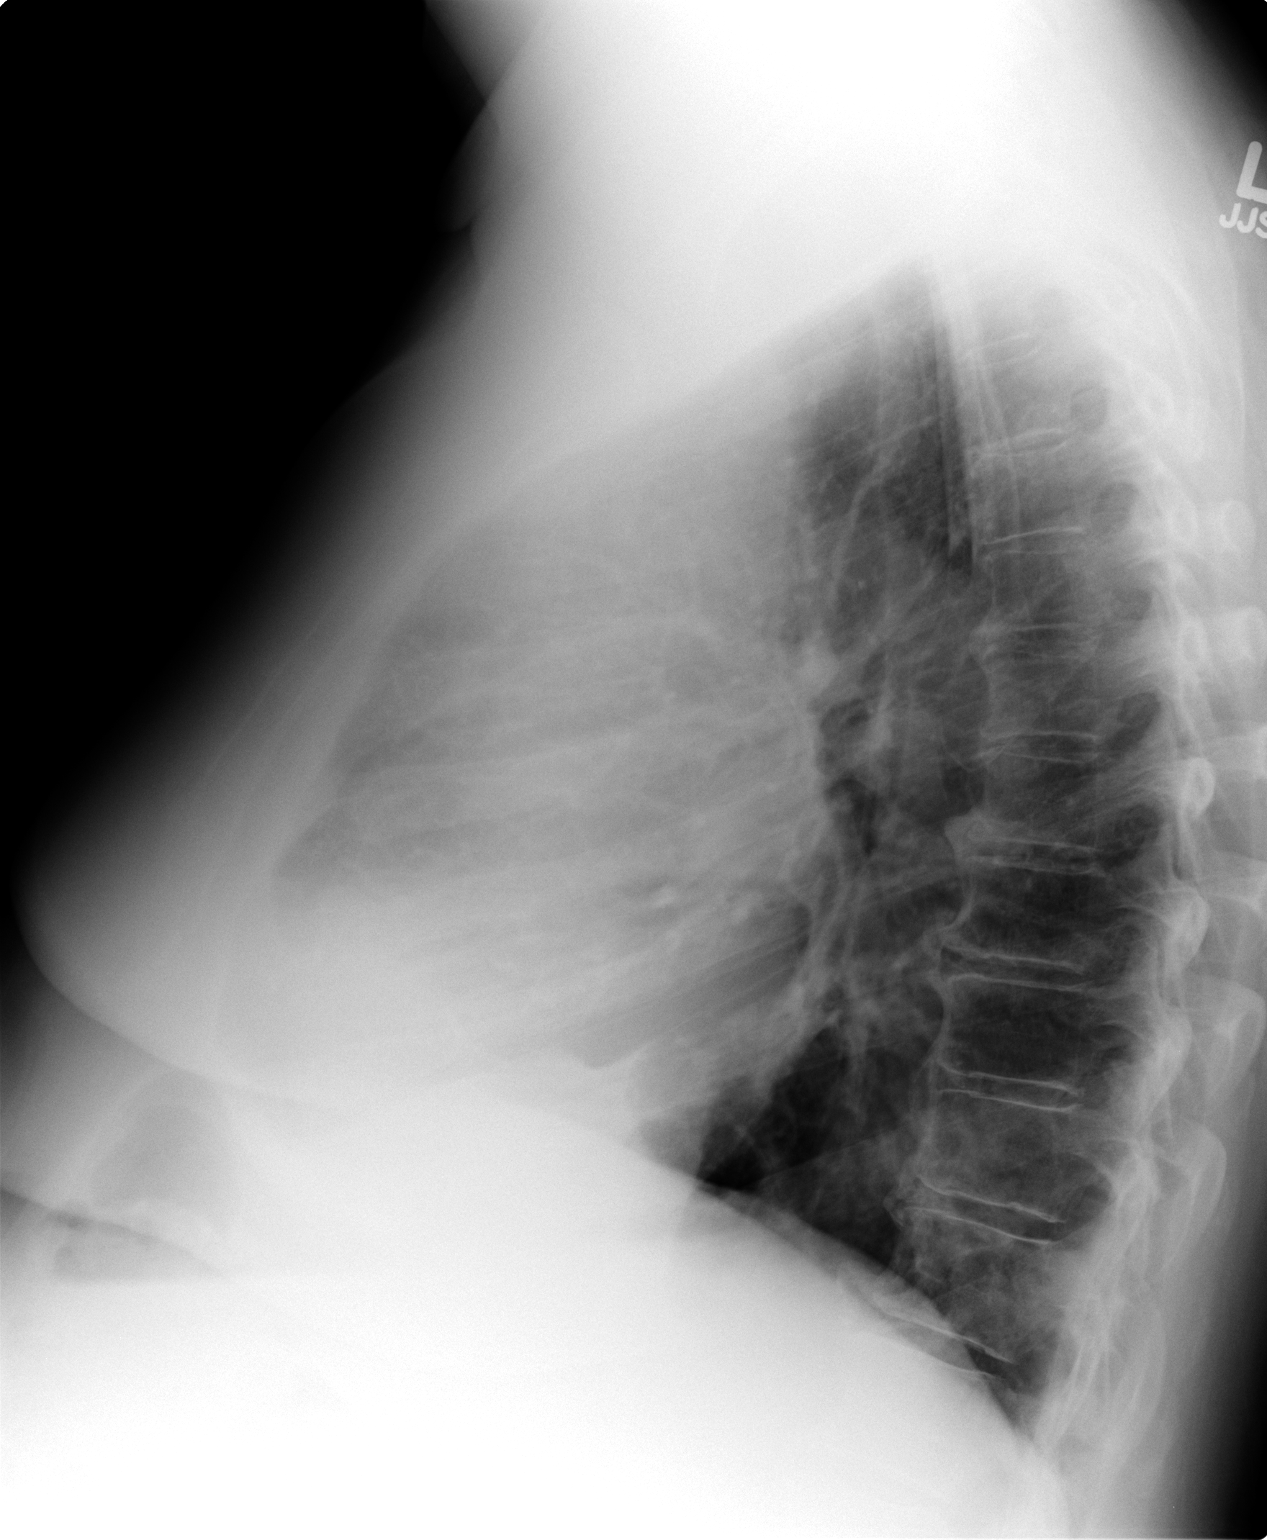

[2 of 2 positions shown; findings below may reference images not displayed]

FINDINGS: No active infiltrate or effusion is seen.  The lungs are
only slightly hyperaerated.  Mediastinal contours appear stable.
The heart is mildly enlarged and stable.  There are diffuse
degenerative changes throughout the thoracic spine.
IMPRESSION: No active lung disease.  Stable mild cardiomegaly and slight
hyperaeration.

## 2014-01-22 NOTE — Telephone Encounter (Signed)
Done, thanks

## 2014-01-23 NOTE — Telephone Encounter (Signed)
Wife advised.  Wife says she has a Rx that was written at the last OV and didn't know that another had been requested.  I advised that I would hold on to this Rx for a few days before destroying it to see if they need it.

## 2014-01-31 ENCOUNTER — Encounter: Payer: Self-pay | Admitting: Internal Medicine

## 2014-01-31 ENCOUNTER — Ambulatory Visit (INDEPENDENT_AMBULATORY_CARE_PROVIDER_SITE_OTHER): Payer: Commercial Managed Care - HMO | Admitting: Internal Medicine

## 2014-01-31 VITALS — BP 138/76 | HR 84 | Temp 98.0°F | Wt 273.8 lb

## 2014-01-31 DIAGNOSIS — M25429 Effusion, unspecified elbow: Secondary | ICD-10-CM

## 2014-01-31 DIAGNOSIS — M25421 Effusion, right elbow: Secondary | ICD-10-CM

## 2014-01-31 NOTE — Progress Notes (Addendum)
Subjective:    Patient ID: [name] [name], male    DOB: 1933/06/24, 80 y.o.   MRN: [id number]  HPI  Pt presents to the clinic today to have a knot on his elbow checked. He noticed this 3 days ago. He reports that he did bump his elbow on a door frame. It did scab over. He did peel the scab off and reports that some orange tinted fluid did drain from the elbow. He reports that he did put alcohol on the area. He denies fever or chills.   Review of Systems      Past Medical History  Diagnosis Date  . OSA on CPAP   . Hyperglycemia   . COPD (chronic obstructive pulmonary disease)   . Anxiety   . Hemorrhoids   . Diverticulosis   . Kidney stones   . PUD (peptic ulcer disease)   . Former smoker   . Right knee DJD 10/31/2011    Severe    Current Outpatient Prescriptions  Medication Sig Dispense Refill  . LORazepam (ATIVAN) 1 MG tablet TAKE 1/4 TO 1/2 TABLET EVERY 6 HOURS AS NEEDED  30 tablet  1  . PROAIR HFA 108 (90 BASE) MCG/ACT inhaler INHALE 1-2 PUFFS INTO THE LUNGS EVERY 6 (SIX) HOURS AS NEEDED FOR WHEEZING.  8.5 each  3  . Respiratory Therapy Supplies (FLUTTER) DEVI Use as directed  1 each  0  . SYMBICORT 160-4.5 MCG/ACT inhaler INHALE 2 PUFFS TWICE A DAY  1 Inhaler  5  . triamcinolone cream (KENALOG) 0.5 % Apply 1 application topically 2 (two) times daily.  30 g  1   No current facility-administered medications for this visit.    Allergies  Allergen Reactions  . Atorvastatin     REACTION: aches  . Celecoxib     REACTION: rash  . Nitrofurantoin   . Sulfa Antibiotics     Intolerant but unrecalled.   . Sulindac     REACTION: rash    Family History  Problem Relation Age of Onset  . Stroke Mother   . Heart disease Mother   . Prostate cancer Father     History   Social History  . Marital Status: Married    Spouse Name: N/A    Number of Children: N/A  . Years of Education: N/A   Occupational History  . Not on file.   Social History Main Topics  .  Smoking status: Former Smoker -- 1.50 packs/day for 53 years    Types: Cigarettes    Quit date: 06/23/1999  . Smokeless tobacco: Former Systems developer    Types: Snuff, Chew  . Alcohol Use: No  . Drug Use: No  . Sexual Activity: Not on file   Other Topics Concern  . Not on file   Social History Narrative   Retired, print shop   Married 47+ years   1 daughter   Former smoker     Constitutional: Denies fever, malaise, fatigue, headache or abrupt weight changes.  Musculoskeletal: Pt reports a knot on his elbow. Denies decrease in range of motion, difficulty with gait, muscle pain.  Skin: Denies redness, rashes, lesions or ulcercations.    No other specific complaints in a complete review of systems (except as listed in HPI above).  Objective:   Physical Exam   BP 138/76  Pulse 84  Temp(Src) 98 F (36.7 C) (Oral)  Wt 273 lb 12 oz (124.172 kg)  SpO2 92% Wt Readings from Last 3 Encounters:  01/31/14  273 lb 12 oz (124.172 kg)  12/19/13 279 lb 8 oz (126.78 kg)  10/03/13 290 lb 8 oz (131.77 kg)    General: Appears his stated age, chronically ill appearing, in NAD. Skin: Warm, dry and intact. No cellulitis noted of elbow. Cardiovascular: Normal rate and rhythm. S1,S2 noted.  No murmur, rubs or gallops noted.  Pulmonary/Chest: Increased effort and coarse vesicular breath sounds. No respiratory distress. No wheezes, rales or ronchi noted.  Musculoskeletal: normal flexion and extension of right elbow. Small golf ball size effusion noted. Tender with palpation.   BMET    Component Value Date/Time   NA 142 03/31/2013 0840   K 4.4 03/31/2013 0840   CL 101 03/31/2013 0840   CO2 33* 03/31/2013 0840   GLUCOSE 100* 03/31/2013 0840   BUN 18 03/31/2013 0840   CREATININE 0.7 03/31/2013 0840   CALCIUM 9.1 03/31/2013 0840   GFRNONAA 100.62 01/10/2009 0936   GFRAA 107 02/10/2007 0000    Lipid Panel     Component Value Date/Time   CHOL 197 03/31/2013 0840   TRIG 95.0 03/31/2013 0840    HDL 53.80 03/31/2013 0840   CHOLHDL 4 03/31/2013 0840   VLDL 19.0 03/31/2013 0840   LDLCALC 124* 03/31/2013 0840    CBC    Component Value Date/Time   WBC 5.5 10/01/2010 0850   RBC 4.28 10/01/2010 0850   HGB 13.7 10/01/2010 0850   HCT 40.1 10/01/2010 0850   PLT 206.0 10/01/2010 0850   MCV 93.7 10/01/2010 0850   MCHC 34.2 10/01/2010 0850   RDW 13.9 10/01/2010 0850   LYMPHSABS 2.0 10/01/2010 0850   MONOABS 0.5 10/01/2010 0850   EOSABS 0.1 10/01/2010 0850   BASOSABS 0.0 10/01/2010 0850    Hgb A1C Lab Results  Component Value Date   HGBA1C 5.9 07/12/2007        Assessment & Plan:   Elbow Effusion:  Drainage performed, see procedure note Advise him to put ice on when he gets home Try Ibuprofen or Aleve for discomfort  Procedure note:  Discussed risk and benefits of procedure including bleeding, pain and possible infection Informed consent obtained and scanned into chart Area cleansed with betadine x 2 Elbow numbed with Painease numbing spray Inserted an 18g needle into the effusion Aspirated approx 6 mL serous fluid No complications from the procedure noted Area cleansed and covered with triple antibiotic ointment and a bandaid Return precautions given  RTC as needed

## 2014-01-31 NOTE — Progress Notes (Signed)
Pre visit review using our clinic review tool, if applicable. No additional management support is needed unless otherwise documented below in the visit note. 

## 2014-01-31 NOTE — Patient Instructions (Addendum)
Elbow Effusion °You have an elbow injury with an effusion. This means there is blood or other fluid in the elbow joint. Both fractures and sprains of the elbow cab cause an effusion with swelling and pain. X-rays often show this swelling around the joint, but they may not show a fracture. The treatment for elbow sprains and minor fractures is to reduce swelling and pain. It rests the joint until movement is painless. Repeating the x-ray study in 1-2 weeks may show a minor fracture of the radius bone that was not visible on the initial x-rays. °Most of the time a splint or sling is used for the first days or week after the injury. Apply ice packs to the elbow for 20-30 minutes every 2 hours for the next 2-3 days. Keep your elbow elevated above the level of your heart as much as possible until the pain and swelling are better. An elastic wrap may also be used to reduce swelling. Call your caregiver for follow-up care within one week.  °The major issue with this condition is loss of elbow motion. In general, your caregiver will start you on motion exercises and may have you follow-up with a physical or hand therapist. °SEEK MEDICAL CARE IF:  °· You develop a numb, cold, or pale forearm or hand. °Document Released: 07/10/2004 Document Revised: 08/25/2011 Document Reviewed: 11/28/2008 °Exit[employer]® Patient Information ©2015 [employer]. This information is not intended to replace advice given to you by your health care provider. Make sure you discuss any questions you have with your health care provider. ° °

## 2014-02-03 IMAGING — CR DG RIBS W/ CHEST 3+V*R*
3 series · 3 of 3 positions shown · non-contrast
Comparison: Chest radiograph on 01/17/2013

CLINICAL DATA: Fall. Right chest injury and rib pain.

EXAM:
RIGHT RIBS AND CHEST - 3+ VIEW

[w chest pa]
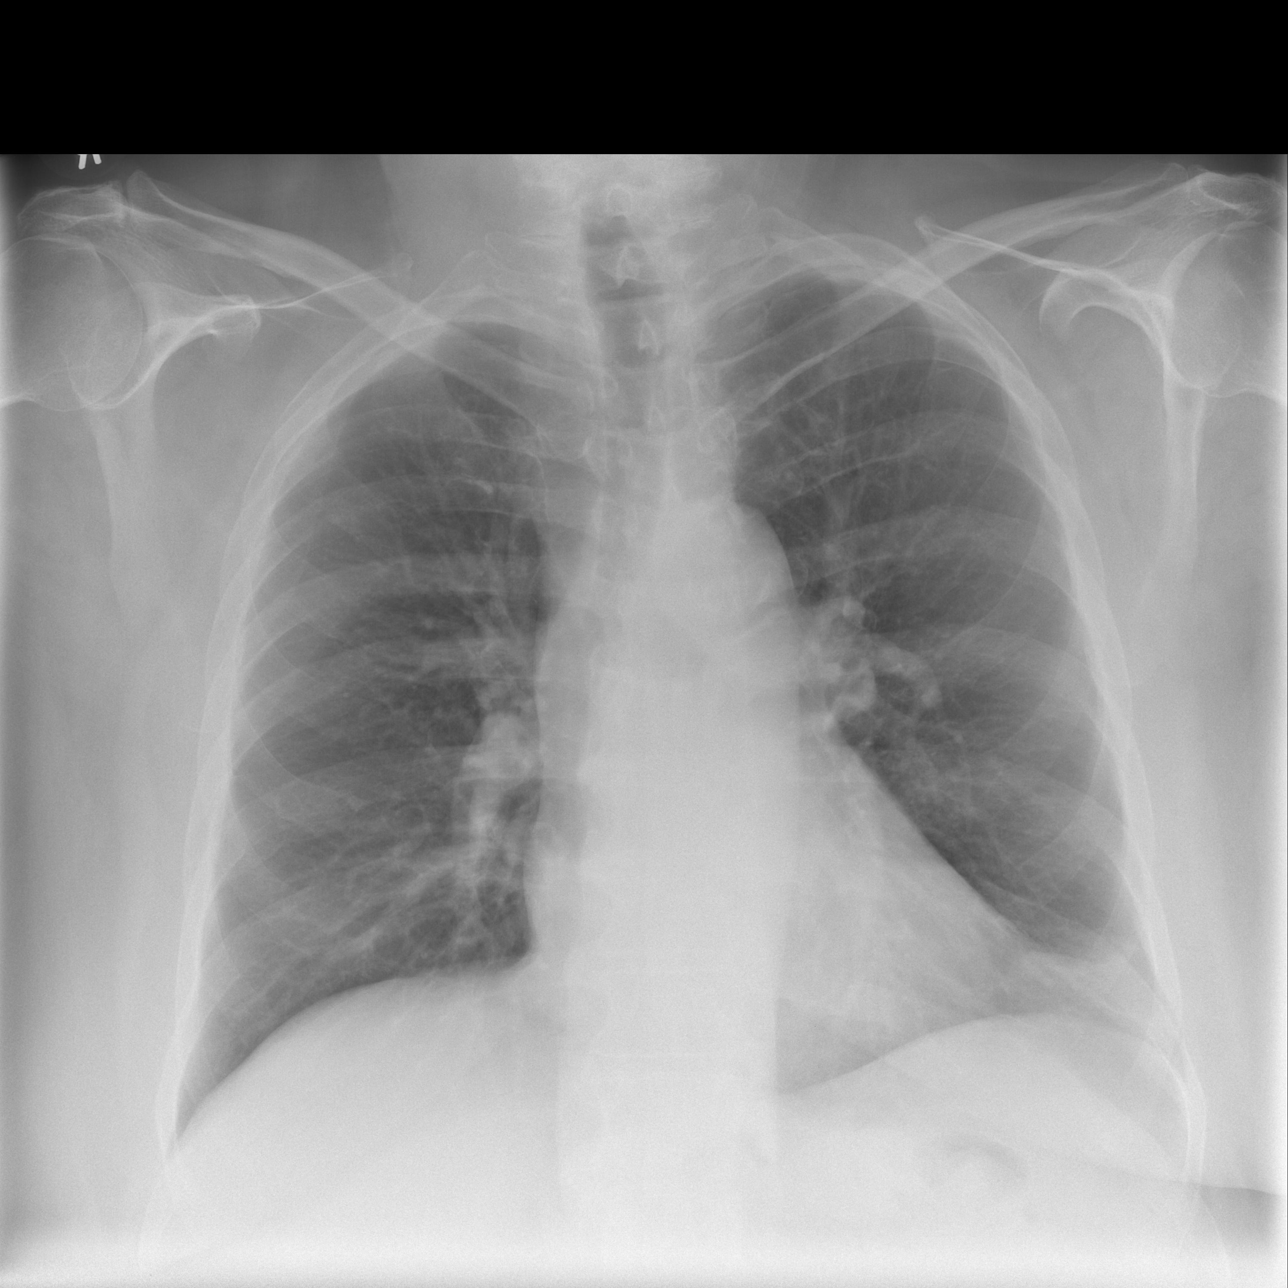

[w ribs ap/pa upper right *]
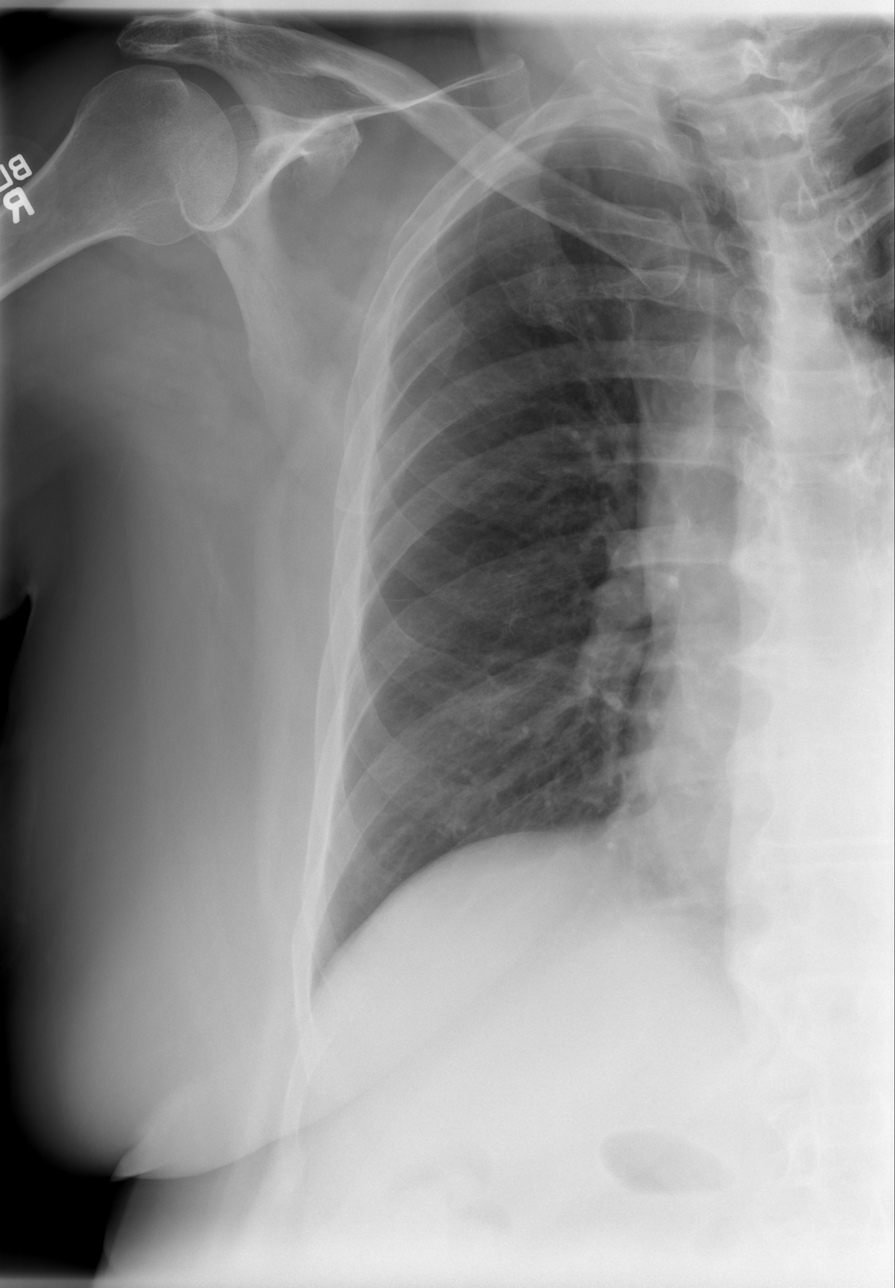

[w ribs ap/pa lower right *]
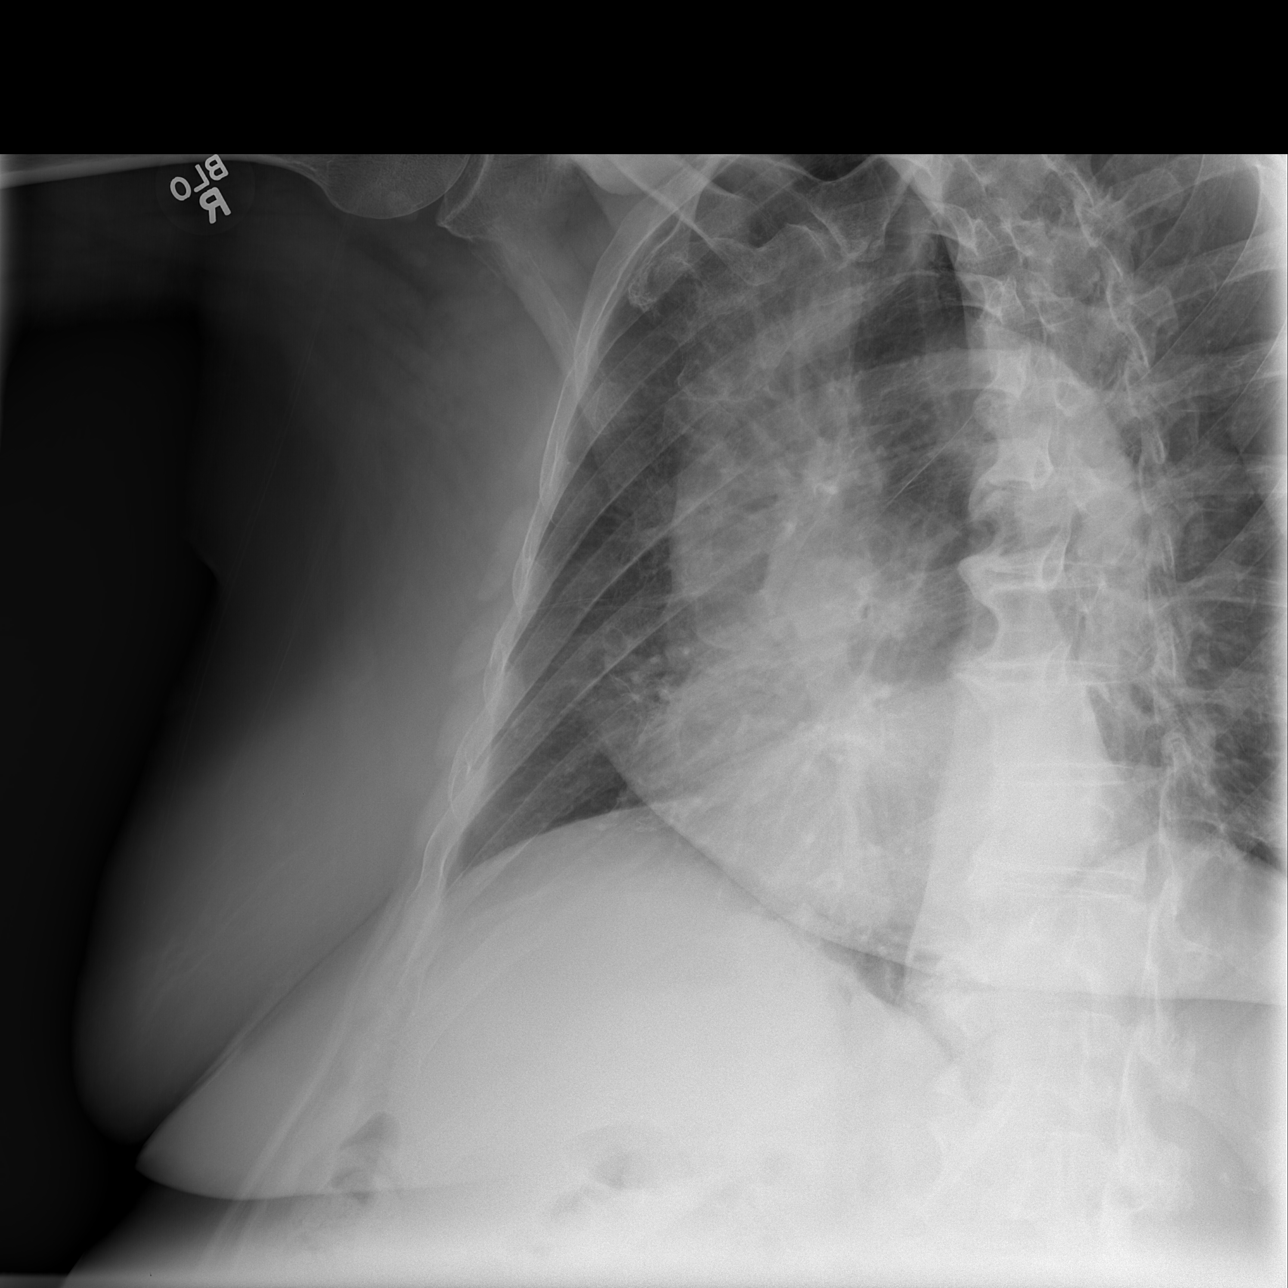

[3 of 3 positions shown; findings below may reference images not displayed]

FINDINGS: No fracture or other bone lesions are seen involving the ribs. There
is no evidence of pneumothorax or pleural effusion. Both lungs are
clear. Heart size and mediastinal contours are within normal limits.
IMPRESSION: Negative.

## 2014-03-30 ENCOUNTER — Ambulatory Visit: Payer: Medicare HMO | Admitting: Pulmonary Disease

## 2014-04-05 ENCOUNTER — Other Ambulatory Visit: Payer: Self-pay | Admitting: Family Medicine

## 2014-04-05 DIAGNOSIS — I10 Essential (primary) hypertension: Secondary | ICD-10-CM

## 2014-04-06 ENCOUNTER — Other Ambulatory Visit: Payer: Medicare Other

## 2014-04-13 ENCOUNTER — Encounter: Payer: Medicare Other | Admitting: Family Medicine

## 2014-04-18 ENCOUNTER — Ambulatory Visit: Payer: Medicare HMO | Admitting: Pulmonary Disease

## 2014-04-18 ENCOUNTER — Other Ambulatory Visit: Payer: Self-pay | Admitting: Pulmonary Disease

## 2014-04-24 ENCOUNTER — Ambulatory Visit (INDEPENDENT_AMBULATORY_CARE_PROVIDER_SITE_OTHER): Payer: Commercial Managed Care - HMO | Admitting: Pulmonary Disease

## 2014-04-24 ENCOUNTER — Ambulatory Visit: Payer: Commercial Managed Care - HMO | Admitting: *Deleted

## 2014-04-24 ENCOUNTER — Encounter: Payer: Self-pay | Admitting: Pulmonary Disease

## 2014-04-24 VITALS — BP 136/66 | HR 76 | Temp 97.1°F | Ht 70.5 in | Wt 278.4 lb

## 2014-04-24 DIAGNOSIS — J9610 Chronic respiratory failure, unspecified whether with hypoxia or hypercapnia: Secondary | ICD-10-CM

## 2014-04-24 DIAGNOSIS — E662 Morbid (severe) obesity with alveolar hypoventilation: Secondary | ICD-10-CM

## 2014-04-24 DIAGNOSIS — Z23 Encounter for immunization: Secondary | ICD-10-CM

## 2014-04-24 DIAGNOSIS — J438 Other emphysema: Secondary | ICD-10-CM

## 2014-04-24 NOTE — Patient Instructions (Signed)
Will give you the flu shot today. No change in breathing medications Keep working on weight loss.  You have made progress. Try new mask that I gave you with your device. followup with me again in 55mos.

## 2014-04-24 NOTE — Progress Notes (Signed)
   Subjective:    Patient ID: [name] [name], male    DOB: 1933/12/03, 80 y.o.   MRN: [id number]  HPI The patient comes in today for follow-up of his obstructive sleep apnea, COPD, and chronic respiratory failure. He has done well since the last visit, and has even lost 9 pounds. He is staying on his bronchodilator regimen, and has not had a recent acute exacerbation. He feels his exertional tolerance is at baseline. He is also wearing C Pap compliantly, but continues to have issues with mask leaking and facial irritation.   Review of Systems  Constitutional: Negative for fever and unexpected weight change.  HENT: Negative for congestion, dental problem, ear pain, nosebleeds, postnasal drip, rhinorrhea, sinus pressure, sneezing, sore throat and trouble swallowing.   Eyes: Negative for redness and itching.  Respiratory: Positive for shortness of breath. Negative for cough, chest tightness and wheezing.   Cardiovascular: Negative for palpitations and leg swelling.  Gastrointestinal: Negative for nausea and vomiting.  Genitourinary: Negative for dysuria.  Musculoskeletal: Negative for joint swelling.  Skin: Negative for rash.  Neurological: Negative for headaches.  Hematological: Does not bruise/bleed easily.  Psychiatric/Behavioral: Negative for dysphoric mood. The patient is not nervous/anxious.        Objective:   Physical Exam Morbidly obese male in no acute distress Nose without purulence or discharge noted No skin breakdown or pressure necrosis from the C Pap mask Neck without lymphadenopathy or thyromegaly Chest with a few basilar crackles, decreased breath sounds, no active wheezing Cardiac exam with regular rate and rhythm Lower extremities with minimal edema, no cyanosis Alert and oriented, moves all 4 extremities.       Assessment & Plan:

## 2014-04-24 NOTE — Assessment & Plan Note (Signed)
The patient appears to be stable from a COPD standpoint on his current bronchodilator regimen. He is not requiring frequent use of his rescue inhaler, and has not had a recent acute exacerbation.

## 2014-04-24 NOTE — Assessment & Plan Note (Signed)
The patient is wearing his C Pap device compliantly, but continues to have issues with his mask. I have shown him a new fullface mask, and also given him a sample of a new nasal pillows device to try. I have also encouraged him to keep working on weight loss.

## 2014-05-04 ENCOUNTER — Other Ambulatory Visit (INDEPENDENT_AMBULATORY_CARE_PROVIDER_SITE_OTHER): Payer: Commercial Managed Care - HMO

## 2014-05-04 ENCOUNTER — Encounter: Payer: Self-pay | Admitting: *Deleted

## 2014-05-04 DIAGNOSIS — I10 Essential (primary) hypertension: Secondary | ICD-10-CM

## 2014-05-04 LAB — COMPREHENSIVE METABOLIC PANEL
ALK PHOS: 87 U/L (ref 39–117)
ALT: 14 U/L (ref 0–53)
AST: 18 U/L (ref 0–37)
Albumin: 3.8 g/dL (ref 3.5–5.2)
BUN: 14 mg/dL (ref 6–23)
CALCIUM: 9.1 mg/dL (ref 8.4–10.5)
CO2: 35 mEq/L — ABNORMAL HIGH (ref 19–32)
CREATININE: 0.8 mg/dL (ref 0.4–1.5)
Chloride: 100 mEq/L (ref 96–112)
GFR: 106.87 mL/min (ref >60.00)
GLUCOSE: 89 mg/dL (ref 70–99)
Potassium: 4.7 mEq/L (ref 3.5–5.1)
Sodium: 140 mEq/L (ref 135–145)
Total Bilirubin: 1.6 mg/dL — ABNORMAL HIGH (ref 0.2–1.2)
Total Protein: 6.7 g/dL (ref 6.0–8.3)

## 2014-05-04 LAB — LIPID PANEL
CHOL/HDL RATIO: 4
CHOLESTEROL: 180 mg/dL (ref 0–200)
HDL: 47 mg/dL (ref >39.00)
LDL CALC: 115 mg/dL — AB (ref 0–99)
NonHDL: 133
TRIGLYCERIDES: 89 mg/dL (ref 0.0–149.0)
VLDL: 17.8 mg/dL (ref 0.0–40.0)

## 2014-05-18 ENCOUNTER — Encounter: Payer: Self-pay | Admitting: Family Medicine

## 2014-05-18 ENCOUNTER — Ambulatory Visit (INDEPENDENT_AMBULATORY_CARE_PROVIDER_SITE_OTHER): Payer: Commercial Managed Care - HMO | Admitting: Family Medicine

## 2014-05-18 ENCOUNTER — Telehealth: Payer: Self-pay | Admitting: *Deleted

## 2014-05-18 VITALS — BP 162/80 | HR 77 | Temp 97.9°F | Ht 70.5 in | Wt 276.8 lb

## 2014-05-18 DIAGNOSIS — J438 Other emphysema: Secondary | ICD-10-CM

## 2014-05-18 DIAGNOSIS — Z0000 Encounter for general adult medical examination without abnormal findings: Secondary | ICD-10-CM

## 2014-05-18 DIAGNOSIS — Z7189 Other specified counseling: Secondary | ICD-10-CM

## 2014-05-18 NOTE — Patient Instructions (Signed)
Take care.  Glad to see you.  I wouldn't change your meds at this point.

## 2014-05-18 NOTE — Progress Notes (Signed)
Pre visit review using our clinic review tool, if applicable. No additional management support is needed unless otherwise documented below in the visit note.  I have personally reviewed the Medicare Annual Wellness questionnaire and have noted 1. The patient's medical and social history 2. Their use of alcohol, tobacco or illicit drugs 3. Their current medications and supplements 4. The patient's functional ability including ADL's, fall risks, home safety risks and hearing or visual             impairment. 5. Diet and physical activities 6. Evidence for depression or mood disorders  The patients weight, height, BMI have been recorded in the chart and visual acuity is per eye clinic.  I have made referrals, counseling and provided education to the patient based review of the above and I have provided the pt with a written personalized care plan for preventive services.  Provider list updated- see scanned forms.  Routine anticipatory guidance given to patient.  See health maintenance.  Flu 2015 Shingles 2010 PNA  2013 Tetanus 2007 Colonoscopy 2008 Prostate cancer screening and PSA options (with potential risks and benefits of testing vs not testing) were discussed along with recent recs/guidelines.  He declined testing PSA at this point. Advance directive- wife designated if patient were incapacitated.  Cognitive function addressed- see scanned forms- and if abnormal then additional documentation follows.   COPD.  On O2, followed by pulm, compliant with meds.  occ sputum use.  No ADE on meds.  Intentional weight loss.  Still with limited mobility.   Can't cover distance due to pulmonary disease w/o stopping and needed to rest, fall risk noted.  Would likely be safer and more able to complete ADLs with use of a walker.   PMH and SH reviewed  Meds, vitals, and allergies reviewed.   ROS: See HPI.  Otherwise negative.    GEN: nad, alert and oriented, on O2 HEENT: mucous membranes  moist NECK: supple w/o LA CV: rrr. PULM: global dec in BS but still ctab, no inc wob ABD: soft, +bs EXT: no edema SKIN: no acute rash

## 2014-05-18 NOTE — Telephone Encounter (Signed)
Patient says you had written a Rx for a walker previously and when he tried to give that to [facility], they said they needed more than just the prescription.  He now has a walker on loan but would like to get one of his own but has lost the original prescription.  [facility] says they need the prescription, office notes, and demographic sheet faxed in to them in order to process the claim.  Fax:  [phone]  Phone:  [phone].

## 2014-05-21 DIAGNOSIS — Z7189 Other specified counseling: Secondary | ICD-10-CM | POA: Insufficient documentation

## 2014-05-21 DIAGNOSIS — Z0000 Encounter for general adult medical examination without abnormal findings: Secondary | ICD-10-CM | POA: Insufficient documentation

## 2014-05-21 NOTE — Assessment & Plan Note (Signed)
Flu 2015 Shingles 2010 PNA  2013 Tetanus 2007 Colonoscopy 2008 Prostate cancer screening and PSA options (with potential risks and benefits of testing vs not testing) were discussed along with recent recs/guidelines.  He declined testing PSA at this point. Advance directive- wife designated if patient were incapacitated.  Cognitive function addressed- see scanned forms- and if abnormal then additional documentation follows.   COPD.  On O2, followed by pulm, compliant with meds.  occ sputum use.  No ADE on meds.  Intentional weight loss.  Still with limited mobility.   Can't cover distance due to pulmonary disease w/o stopping and needed to rest, fall risk noted.  Would likely be safer and more able to complete ADLs with use of a walker.

## 2014-05-21 NOTE — Assessment & Plan Note (Signed)
On O2, followed by pulm, compliant with meds.  occ sputum use.  No ADE on meds.  Intentional weight loss.  Still with limited mobility.   Can't cover distance due to pulmonary disease w/o stopping and needed to rest, fall risk noted.   Would likely be safer and more able to complete ADLs with use of a walker.

## 2014-05-21 NOTE — Telephone Encounter (Signed)
Rx done, please send that and the OV from 05/18/14, along with demographics.  Thanks.

## 2014-05-22 NOTE — Telephone Encounter (Signed)
Rx, OV note from 05/18/14 and demographics faxed to [facility].

## 2014-10-03 ENCOUNTER — Other Ambulatory Visit: Payer: Self-pay | Admitting: Family Medicine

## 2014-10-03 NOTE — Telephone Encounter (Signed)
Electronic refill request. Last Filled:    30 tablet 1 RF on 12/19/2013  Please advise.

## 2014-10-03 NOTE — Telephone Encounter (Signed)
[name] [name] left v/m requesting status of ativan refill; [name] [name] thinks requested refill on 09/29/14.Please advise. [name] [name] request cb. Annual exam on 05/18/2014.

## 2014-10-04 NOTE — Telephone Encounter (Signed)
Please call in.  Thanks.   

## 2014-10-04 NOTE — Telephone Encounter (Signed)
Done.Called in Rx to [facility].

## 2014-10-23 ENCOUNTER — Ambulatory Visit (INDEPENDENT_AMBULATORY_CARE_PROVIDER_SITE_OTHER): Payer: PPO | Admitting: Pulmonary Disease

## 2014-10-23 ENCOUNTER — Encounter: Payer: Self-pay | Admitting: Pulmonary Disease

## 2014-10-23 VITALS — BP 122/68 | HR 80 | Temp 97.0°F | Ht 70.0 in | Wt 284.0 lb

## 2014-10-23 DIAGNOSIS — J9610 Chronic respiratory failure, unspecified whether with hypoxia or hypercapnia: Secondary | ICD-10-CM

## 2014-10-23 DIAGNOSIS — G4733 Obstructive sleep apnea (adult) (pediatric): Secondary | ICD-10-CM

## 2014-10-23 DIAGNOSIS — J438 Other emphysema: Secondary | ICD-10-CM

## 2014-10-23 NOTE — Assessment & Plan Note (Signed)
The patient is wearing his CPAP compliantly, but continues to have issues with mask fit. I think he would benefit from a session at the sleep Center to look at different types of mask. He is also continued to gain weight, and I've encouraged him to work aggressively on weight loss. Other than his mask fit, he feels that he sleeps well with his device.

## 2014-10-23 NOTE — Progress Notes (Signed)
   Subjective:    Patient ID: [name] [name], male    DOB: 01/13/1934, 81 y.o.   MRN: [id number]  HPI The patient comes in today for follow-up of his mild to moderate COPD, as well as obstructive sleep apnea with obesity hypoventilation. He is wearing his C Pap compliantly, but is having issues with his mask fit this time. This has been an ongoing issue for him. He has had no significant chest congestion or purulence, and feels that his exertional tolerance is at baseline. His weight has increased significantly since the last visit. He is not able to do a lot of activity, and it is primarily related to his weight and conditioning, in addition to his breathing.   Review of Systems  Constitutional: Negative for fever and unexpected weight change.  HENT: Negative for congestion, dental problem, ear pain, nosebleeds, postnasal drip, rhinorrhea, sinus pressure, sneezing, sore throat and trouble swallowing.   Eyes: Negative for redness and itching.  Respiratory: Positive for chest tightness, shortness of breath and wheezing. Negative for cough.   Cardiovascular: Negative for palpitations and leg swelling.  Gastrointestinal: Negative for nausea and vomiting.  Genitourinary: Negative for dysuria.  Musculoskeletal: Negative for joint swelling.  Skin: Negative for rash.  Neurological: Negative for headaches.  Hematological: Does not bruise/bleed easily.  Psychiatric/Behavioral: Negative for dysphoric mood. The patient is not nervous/anxious.        Objective:   Physical Exam Morbidly obese male in no acute distress Nose without purulence or discharge noted Neck without lymphadenopathy or thyromegaly Chest with decreased breath sounds, mild basilar crackles, no wheezing Cardiac exam with regular rate and rhythm, 3/6 systolic murmur Lower extremities without edema, no cyanosis Alert and oriented, moves all 4 extremities.       Assessment & Plan:

## 2014-10-23 NOTE — Patient Instructions (Signed)
Will refer to sleep center for a mask fitting. No change in breathing meds Work on weight loss and conditioning.   Think about pulmonary rehab program in [place]. followup in 13mos with Dr. Halford Chessman.

## 2014-10-23 NOTE — Assessment & Plan Note (Addendum)
The patient appears to be stable from a COPD standpoint, and I have stressed to him the importance of weight loss and conditioning to improve his quality of life. He is to continue on his current bronchodilator regimen.

## 2014-10-28 ENCOUNTER — Other Ambulatory Visit: Payer: Self-pay | Admitting: Pulmonary Disease

## 2015-04-11 ENCOUNTER — Ambulatory Visit (INDEPENDENT_AMBULATORY_CARE_PROVIDER_SITE_OTHER): Payer: PPO | Admitting: Pulmonary Disease

## 2015-04-11 ENCOUNTER — Encounter: Payer: Self-pay | Admitting: Pulmonary Disease

## 2015-04-11 ENCOUNTER — Other Ambulatory Visit (INDEPENDENT_AMBULATORY_CARE_PROVIDER_SITE_OTHER): Payer: PPO

## 2015-04-11 VITALS — BP 122/80 | HR 83 | Temp 98.0°F | Wt 278.4 lb

## 2015-04-11 DIAGNOSIS — G4733 Obstructive sleep apnea (adult) (pediatric): Secondary | ICD-10-CM

## 2015-04-11 DIAGNOSIS — J9610 Chronic respiratory failure, unspecified whether with hypoxia or hypercapnia: Secondary | ICD-10-CM

## 2015-04-11 DIAGNOSIS — J438 Other emphysema: Secondary | ICD-10-CM

## 2015-04-11 LAB — CBC WITH DIFFERENTIAL/PLATELET
BASOS PCT: 0.4 % (ref 0.0–3.0)
Basophils Absolute: 0 10*3/uL (ref 0.0–0.1)
EOS PCT: 2 % (ref 0.0–5.0)
Eosinophils Absolute: 0.1 10*3/uL (ref 0.0–0.7)
HCT: 43.8 % (ref 39.0–52.0)
Hemoglobin: 14.4 g/dL (ref 13.0–17.0)
LYMPHS ABS: 1.6 10*3/uL (ref 0.7–4.0)
Lymphocytes Relative: 24.8 % (ref 12.0–46.0)
MCHC: 32.9 g/dL (ref 30.0–36.0)
MCV: 94.3 fl (ref 78.0–100.0)
MONOS PCT: 6.1 % (ref 3.0–12.0)
Monocytes Absolute: 0.4 10*3/uL (ref 0.1–1.0)
NEUTROS ABS: 4.3 10*3/uL (ref 1.4–7.7)
Neutrophils Relative %: 66.7 % (ref 43.0–77.0)
Platelets: 233 10*3/uL (ref 150.0–400.0)
RBC: 4.65 Mil/uL (ref 4.22–5.81)
RDW: 13.7 % (ref 11.5–15.5)
WBC: 6.4 10*3/uL (ref 4.0–10.5)

## 2015-04-11 LAB — BASIC METABOLIC PANEL
BUN: 14 mg/dL (ref 6–23)
CALCIUM: 9.8 mg/dL (ref 8.4–10.5)
CO2: 38 mEq/L — ABNORMAL HIGH (ref 19–32)
Chloride: 98 mEq/L (ref 96–112)
Creatinine, Ser: 0.68 mg/dL (ref 0.40–1.50)
GFR: 119.38 mL/min (ref >60.00)
Glucose, Bld: 97 mg/dL (ref 70–99)
Potassium: 4.6 mEq/L (ref 3.5–5.1)
SODIUM: 141 meq/L (ref 135–145)

## 2015-04-11 LAB — TSH: TSH: 2.62 u[IU]/mL (ref 0.35–4.50)

## 2015-04-11 LAB — HEPATIC FUNCTION PANEL
ALBUMIN: 4.1 g/dL (ref 3.5–5.2)
ALK PHOS: 97 U/L (ref 39–117)
ALT: 11 U/L (ref 0–53)
AST: 16 U/L (ref 0–37)
BILIRUBIN DIRECT: 0.2 mg/dL (ref 0.0–0.3)
BILIRUBIN TOTAL: 1.3 mg/dL — AB (ref 0.2–1.2)
Total Protein: 7.4 g/dL (ref 6.0–8.3)

## 2015-04-11 MED ORDER — TIOTROPIUM BROMIDE MONOHYDRATE 18 MCG IN CAPS: 18.0000 ug | ORAL_CAPSULE | Freq: Every day | RESPIRATORY_TRACT | Status: DC

## 2015-04-11 NOTE — Patient Instructions (Signed)
Tremayne- it was nice meeting you today...  We checked your Spirometry & blood work today, and we will arrange for an outpt CT scan of your chest & 2DEchocardiogram...    We will contact you w/ the results when available, and we will determine where to go from there...  In the meanwhile--    Continue the SYMBICORT 2 sprays twice daily...     Add back the [facility] - inhale the contents of one capsule via the handihaler daily...    Increase the MUCINEX 600mg  tabs- one tab by mout 4 times daily w/ extra water...  Concentrate on good deep breaths and cough to clear the phlegm...  Work w/ [facility] to replace your CPAP mask & tubing...  Call for any questions...  Let's plan a follow up visit in 4-6wks, sooner if needed for problems.Marland KitchenMarland Kitchen

## 2015-04-18 ENCOUNTER — Ambulatory Visit (INDEPENDENT_AMBULATORY_CARE_PROVIDER_SITE_OTHER)
Admission: RE | Admit: 2015-04-18 | Discharge: 2015-04-18 | Disposition: A | Discharge status: Home / Self Care | Payer: PPO | Source: Ambulatory Visit | Attending: Pulmonary Disease | Admitting: Pulmonary Disease

## 2015-04-18 ENCOUNTER — Other Ambulatory Visit: Payer: Self-pay

## 2015-04-18 ENCOUNTER — Ambulatory Visit (HOSPITAL_COMMUNITY): Payer: PPO | Attending: Cardiology

## 2015-04-18 DIAGNOSIS — I5189 Other ill-defined heart diseases: Secondary | ICD-10-CM | POA: Insufficient documentation

## 2015-04-18 DIAGNOSIS — J9610 Chronic respiratory failure, unspecified whether with hypoxia or hypercapnia: Secondary | ICD-10-CM | POA: Diagnosis not present

## 2015-04-18 DIAGNOSIS — R0600 Dyspnea, unspecified: Secondary | ICD-10-CM | POA: Insufficient documentation

## 2015-04-18 DIAGNOSIS — I350 Nonrheumatic aortic (valve) stenosis: Secondary | ICD-10-CM | POA: Diagnosis not present

## 2015-04-18 DIAGNOSIS — I059 Rheumatic mitral valve disease, unspecified: Secondary | ICD-10-CM | POA: Insufficient documentation

## 2015-04-18 DIAGNOSIS — Z8249 Family history of ischemic heart disease and other diseases of the circulatory system: Secondary | ICD-10-CM | POA: Insufficient documentation

## 2015-04-18 DIAGNOSIS — Z87891 Personal history of nicotine dependence: Secondary | ICD-10-CM | POA: Insufficient documentation

## 2015-04-18 DIAGNOSIS — J438 Other emphysema: Secondary | ICD-10-CM

## 2015-04-18 DIAGNOSIS — I517 Cardiomegaly: Secondary | ICD-10-CM | POA: Insufficient documentation

## 2015-04-18 DIAGNOSIS — G4733 Obstructive sleep apnea (adult) (pediatric): Secondary | ICD-10-CM

## 2015-04-20 ENCOUNTER — Other Ambulatory Visit: Payer: Self-pay | Admitting: Pulmonary Disease

## 2015-04-20 ENCOUNTER — Telehealth: Payer: Self-pay | Admitting: Pulmonary Disease

## 2015-04-20 ENCOUNTER — Encounter: Payer: Self-pay | Admitting: Pulmonary Disease

## 2015-04-20 DIAGNOSIS — I350 Nonrheumatic aortic (valve) stenosis: Secondary | ICD-10-CM

## 2015-04-20 NOTE — Telephone Encounter (Signed)
Office notes faxed  Nothing further is needed

## 2015-04-20 NOTE — Progress Notes (Signed)
Subjective:     Patient ID: [name] [name], male   DOB: Oct 31, 1933, 81 y.o.   MRN: [id number]  HPI    81 y/o WM followed for OSA, COPD, chronic resp failure on home O2...     PFT 01/1999 showed FVC=2.11 (46%), FEV1=1.33 (37%), %1sec=63, mid-flows reduced at 17% pred; post bronchodil FEV1=1.52 (14% imroved); TLC=6.12 (86%), RV=4.01 (157%), RV/TLC=66; DLCO=70% pred;;this is c/w moderate obstructive ventilatory defect w/ small revers component, air trapping, & mild decr diffusion...   Sleep Study 11/1999 showed RDI=33/hr mostly hypopneas, subseq CPAP titration=> good control on CPAP 12 but desat to <90% despite the CPAP...  CPAP titration 2005 showed fair control w/ CPAP 15 7 rec to use CPAP 16, Fisher[employer]kel full face mask, & noted signif leg jerks w/ arousals...  CT Angio Chest 01/28/2004 showed neg for PE, 2.5 cm well circumscribed mass in ant mediastinum just deep to the manubrium (r/o thymoma), atherosclerotic vasc calcif  He was seen by [facility] who rec surgery for excision of the ant mediastinal mass ; pt declined surg & went to [facility] for 2nd opinion...  12/05 - 3/06> Pt evaluated at [facility] DrFeins CV surg- they also rec surg to remove the cystic mass but pt declined & preferred conservative follow up...  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  CXR 07/24/08 showed borderline heart size, patchy RUL opac c/w pneumonia, DJD Tspine...  Desensitization mask fit> this was ordered by DrClance 09/2012 & 10/2014...  Baseline CXR 01/17/13 showed borderline cardiomeg, sl hyperinflation, clear lungs/ NAD, DJD in Tspine...  EKG 10/03/13 showed NSR, rate89, poor R prog V1-3, NAD...    ~  April 24, 2014:  ROV w/ KC>    The patient comes in today for follow-up of his obstructive sleep apnea, COPD, and chronic respiratory failure. He has done well since the last visit, and has even lost 9 pounds. He is staying on his bronchodilator regimen, and has not had a recent acute exacerbation. He feels his exertional tolerance is at baseline. He is also wearing C Pap compliantly, but continues to have issues with mask leaking and facial irritation.      REC>  OSA: The patient is wearing his C Pap device compliantly, but continues to have issues with his mask. I have shown him a new fullface mask, and also given him a sample of a new nasal pillows device to try. I have also encouraged him to keep working on weight loss...  COPD: The patient appears to be stable from a COPD standpoint on his current bronchodilator regimen. He is not requiring frequent use of his rescue inhaler, and has not had a recent acute exacerbation.  ~  Oct 23, 2014:  ROV w/ KC>        The patient comes in today for follow-up of his mild to moderate COPD, as well as obstructive sleep apnea with obesity hypoventilation. He is wearing his C Pap compliantly, but is having issues with his mask fit this time. This has been an ongoing issue for him. He has had no significant chest congestion or purulence, and feels that his exertional tolerance is at baseline. His weight has increased significantly since the last visit. He is not able to  do a lot of activity, and it is primarily related to his weight and conditioning, in addition to his breathing.      REC>  OSA: The patient is wearing his CPAP compliantly, but continues to have issues with mask fit. I think he would benefit from a session at the sleep Center to look at different types of mask. He is also continued to gain weight, and I've encouraged him to work aggressively on weight loss. Other than his mask fit, he feels that he sleeps well with his device...  COPD: The patient appears to be stable from a COPD  standpoint, and I have stressed to him the importance of weight loss and conditioning to improve his quality of life. He is to continue on his current bronchodilator regimen.  ~  April 11, 2015:  Add-on appt w/ SN>        Pt is c/o increased congestion "in my throat & chest, cough w/ small amt beige sput, no hemoptysis, and incr SOB "I just can't get up the phlegm" w/ DOE during ADLs and any activity; he notes that his O2sat will drop into the 70's (states he prev used the oxygen just as needed but now he needs it all the time);  He is an ex-smoker, smoking for over 68yrs up to 1.5ppd, quit in 2001;  He states that he can't do any exercise but blames his "leg problem" which he in turn blames on "too much Celebrex", says it's stiff, exam w/ DJD & venous insuffic changes but I don't see any recent PCP eval;  He has hx of poor compliance w/ med rx-- see prob list below...       EXAM shows Afeb, VSS, O2sat=94% (on 4L);  HEENT- neg, mallampati2;  Chest- decr BS bilat w/ scat rhonchi, mild end-exp wheezing, no rales or consolidation;  Heart- R%R gr3/6AS murmur w/o rubs/ gallops;  Abd- soft, neg;  Ext- VI, 1+edema, w/o c/c;  Neuro- intact w/o focal abn...     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    COPD w/ chr hypoxemic resp failure>  On Home O2, Symbicort160-2spBid, not on Spiriva, Mucinex 600mg - taking one daily, Flutter valve rx, ProairHFA prn (averages 2x per week he says;  He has severe airflow obstruction, GOLD Stage4 COPD and needs max medication & compliance w/ med rx => rec to use O2 at 2L/min continuous, Symbicort160-2spBid, add SPIRIVA daily, increase the MUCINEX 600mg  to 2Bid + Flutter Rx, and prn ProairHFA...     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    SEVERE AORTIC STENOSIS> see below-- prev objective data showed calcif AV on CT scans, exam reveals loud AS murmur, 2DEcho c/w severe AS => refer to CARDS...    Anxiety>  On Ativan 1mg - using 1/4 to 1/2 tab Q6H prn by his description...   Spirometry 04/11/15 showed FVC=1,52 (35%), FEV1=0.77 (24%), %1sec=50, mid-flows reduced at 11% predicted;  This is c/w severe airflow obstruction and GOLD Stage 4 COPD  Labs 04/11/15>  Chems- ok x HCO3=38;  CBC- wnl;  TSH=2.62...  CT Chest 04/18/15 showed norm heart size,  scattered atherosclerotic calcif but dense calcif in AoV region & mitral annulus; anterior mediastinal nodule sl larger than 2007 (now 88mm, prev 41mm) felt to represent a benign thymic cyst; patchy tree-in-bud appearance of RML- likely inflammatory or poss atyp infection like MAC; scarring left base; mod degen changes in Tspine...   2DEcho 04/18/15 showed mod LVH w/ norm LVF & EF=55-60% & no regional wall motion abn; grade 1 DD; severe Ao stenosis w/ mean grad=51 and peak grad=86; severely calcif mitral annulus, mild LA dil... IMP/PLAN>>  [name] [name] has severe multisys dis w/ severe COPD & not on max therapy- in this regard we discussed the NEED for medication compliance- use CPAP nightly, O2 continuously, Symbicort160-2spBid, add SPIRIVA daily, increase MUCINEX600-2Bid, and ProairHFA rescue inhaler as needed... EXAM reveals AS & 2DEcho confirms SEVERE AS & he does not have a cardiologist => refer to cards ASAP... We plan recheck 59mo.   Past Medical History  Diagnosis Date  . OSA on CPAP   . Hyperglycemia   . COPD (chronic obstructive pulmonary disease) ([place])   . Anxiety   . Hemorrhoids   . Diverticulosis   . Kidney stones   . PUD (peptic ulcer disease)   . Former smoker   . Right knee DJD 10/31/2011    Severe    Past Surgical History  Procedure Laterality Date  .  Knee surgery    . Cholecystectomy    . Tonsillectomy      Outpatient Encounter Prescriptions as of 04/11/2015  Medication Sig  . guaiFENesin (MUCINEX) 600 MG 12 hr tablet Take 600 mg by mouth daily.  Marland Kitchen LORazepam (ATIVAN) 1 MG tablet TAKE 1/4 TO 1/2 TABLET BY MOUTH EVERY 6 HOURS AS NEEDED  . OXYGEN Inhale into the lungs. 4 Liters  . PROAIR HFA 108 (90 BASE) MCG/ACT inhaler INHALE 1-2 PUFFS INTO THE LUNGS EVERY 6 (SIX) HOURS AS NEEDED FOR WHEEZING.  . SYMBICORT 160-4.5 MCG/ACT inhaler INHALE 2 PUFFS BY MOUTH TWICE A DAY  . Respiratory Therapy Supplies (FLUTTER) DEVI Use as directed (Patient not taking: Reported on 04/11/2015)    Allergies  Allergen Reactions  . Atorvastatin     REACTION: aches  . Celecoxib     REACTION: rash  . Nitrofurantoin   . Sulfa Antibiotics     Intolerant but unrecalled.   . Sulindac     REACTION: rash    Immunization History  Administered Date(s) Administered  . Influenza Split 06/17/2011, 02/23/2012  . Influenza Whole 04/27/2006, 04/10/2009, 04/10/2010  . Influenza,inj,Quad PF,36+ Mos 03/17/2013, 04/24/2014  . Pneumococcal Polysaccharide-23 02/15/1999, 10/20/2011  . Td 02/05/2006  . Zoster 01/15/2009    Current Medications, Allergies, Past Medical History, Past Surgical History, Family History, and Social History were reviewed in [employer] record.   Review of Systems             All symptoms NEG except where BOLDED >>  Constitutional:  F/C/S, fatigue, anorexia, unexpected weight change. HEENT:  HA, visual changes, hearing loss, earache, nasal symptoms, sore throat, mouth sores, hoarseness. Resp:  cough, sputum, hemoptysis; SOB, tightness, wheezing. Cardio:  CP, palpit, DOE, orthopnea, edema. GI:  N/V/D/C, blood in stool; reflux, abd pain, distention, gas. GU:  dysuria, freq, urgency, hematuria, flank pain, voiding difficulty. MS:  joint pain, swelling, tenderness, decr ROM; neck pain, back pain, etc. Neuro:  HA,  tremors, seizures, dizziness, syncope, weakness, numbness, gait abn. Skin:  suspicious lesions or skin rash. Heme:  adenopathy, bruising, bleeding. Psyche:  confusion, agitation, sleep disturbance,  hallucinations, anxiety, depression suicidal.   Objective:   Physical Exam       Vital Signs:  Reviewed...  General:  WD, overweight, 81 y/o WM in NAD; Wt= 278#; alert & oriented; pleasant & cooperative... HEENT:  [place]/AT; Conjunctiva- pink, Sclera- nonicteric, EOM-wnl, PERRLA, EACs-clear, TMs-wnl; NOSE-clear; THROAT-clear & wnl. Neck:  Supple w/ fair ROM; no JVD; normal carotid impulses w/ transmit murmur; no thyromegaly or nodules palpated; no lymphadenopathy. Chest:  Decr BS bilat w/ scat rhonchi & end-exp wheezing; no rales or consolidation... Heart:  Regular Rhythm;  Gr 3/6 Sys murmur of AS; w/o rubs or gallops... Abdomen:  Soft & nontender- no guarding or rebound; normal bowel sounds; no organomegaly or masses palpated. Ext:  Decr ROM; without deformities +arthritic changes; +varicose veins/ venous insuffic, tr edema;  Pulses intact w/o bruits. Neuro:  No focal neuro deficits; +gait abnormality... Derm:  No lesions noted; no rash etc. Lymph:  No cervical, supraclavicular, axillary, or inguinal adenopathy palpated.   Assessment:      IMP/PLAN>>  [name] [name] has severe multisys dis w/ severe COPD & not on max therapy- in this regard we discussed the NEED for medication compliance- use CPAP nightly, O2 continuously, Symbicort160-2spBid, add SPIRIVA daily, increase MUCINEX600-2Bid, and ProairHFA rescue inhaler as needed... EXAM reveals AS & 2DEcho confirms SEVERE AS & he does not have a cardiologist => refer to cards ASAP... We plan recheck 23mo.     Plan:     Patient's Medications  New Prescriptions   TIOTROPIUM (SPIRIVA HANDIHALER) 18 MCG INHALATION CAPSULE    Place 1 capsule (18 mcg total) into inhaler and inhale daily.  Previous Medications   GUAIFENESIN (MUCINEX) 600 MG 12 HR TABLET    Take  600 mg by mouth daily.   LORAZEPAM (ATIVAN) 1 MG TABLET    TAKE 1/4 TO 1/2 TABLET BY MOUTH EVERY 6 HOURS AS NEEDED   OXYGEN    Inhale into the lungs. 4 Liters   PROAIR HFA 108 (90 BASE) MCG/ACT INHALER    INHALE 1-2 PUFFS INTO THE LUNGS EVERY 6 (SIX) HOURS AS NEEDED FOR WHEEZING.   RESPIRATORY THERAPY SUPPLIES (FLUTTER) DEVI    Use as directed   SYMBICORT 160-4.5 MCG/ACT INHALER    INHALE 2 PUFFS BY MOUTH TWICE A DAY  Modified Medications   No medications on file  Discontinued Medications   No medications on file

## 2015-04-23 NOTE — Progress Notes (Signed)
Cardiology Office Note   Date:  04/24/2015   ID:  [name] [name], DOB 02-09-1934, MRN [id number]   Patient Care Team: Tonia Ghent, MD as PCP - General (Family Medicine) Noralee Space, MD as Consulting Physician (Pulmonary Disease) Thayer Headings, MD as Consulting Physician (Cardiology)    Chief Complaint  Patient presents with  . Aortic Stenosis    New Patient     History of Present Illness: [name] [name] is a 81 y.o. male with a hx of chronic respiratory failure on home O2, COPD, OSA on CPAP, former smoking.  Recently evaluated by Pulmonology for worsening dyspnea.  His exam was notable for systolic murmur and Echo demonstrated severe AS with a mean gradient of 51 mmHg.  He is referred for evaluation.    He has chronic dyspnea. He feels that this has gotten worse over the last 6-12 mos. He is now short of breath with any activity.  He is NYHA 3.  He denies syncope. He notes some chest pressure with activity over the past 6 mos.  He denies orthopnea, PND.  He sleeps with CPAP. He has chronic LE edema.  He has noticed this since he took Celebrex years ago.  It is stable without worsening.     Studies/Reports Reviewed Today:  Echo 04/18/15 Mod LVH, EF 55-60%, no RWMA, Gr 1 DD, severe AS (mean 51 mmHg; peak 86 mmHg), MAC, mild LAE   Past Medical History  Diagnosis Date  . OSA on CPAP   . Hyperglycemia   . COPD (chronic obstructive pulmonary disease) ([facility])     on home O2 4LPM  . Anxiety   . Hemorrhoids   . Diverticulosis   . Kidney stones   . PUD (peptic ulcer disease)   . Former smoker     quit 2001  . Right knee DJD 10/31/2011    Severe  . Severe aortic stenosis     a. Echo 11/16:  Mod LVH, EF 55-60%, no RWMA, Gr 1 DD, severe AS (mean 51 mmHg; peak 86 mmHg), MAC, mild LAE    Past Surgical History  Procedure Laterality Date  . Knee surgery    . Cholecystectomy    . Tonsillectomy       Current Outpatient Prescriptions  Medication Sig Dispense  Refill  . guaiFENesin (MUCINEX) 600 MG 12 hr tablet Take 600 mg by mouth daily.    Marland Kitchen LORazepam (ATIVAN) 1 MG tablet TAKE 1/4 TO 1/2 TABLET BY MOUTH EVERY 6 HOURS AS NEEDED 30 tablet 2  . OXYGEN Inhale into the lungs. 4 Liters    . PROAIR HFA 108 (90 BASE) MCG/ACT inhaler INHALE 1-2 PUFFS INTO THE LUNGS EVERY 6 (SIX) HOURS AS NEEDED FOR WHEEZING. 8.5 each 3  . Respiratory Therapy Supplies (FLUTTER) DEVI Use as directed 1 each 0  . SYMBICORT 160-4.5 MCG/ACT inhaler INHALE 2 PUFFS BY MOUTH TWICE A DAY 10.2 Inhaler 5  . tiotropium (SPIRIVA HANDIHALER) 18 MCG inhalation capsule Place 1 capsule (18 mcg total) into inhaler and inhale daily. 30 capsule 6   No current facility-administered medications for this visit.    Allergies:   Atorvastatin; Celecoxib; Nitrofurantoin; Sulfa antibiotics; and Sulindac    Social History:   Social History   Social History  . Marital Status: Married    Spouse Name: N/A  . Number of Children: 1  . Years of Education: N/A   Occupational History  . 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 Smoking status: Former Smoker -- 1.50 packs/day for 53 years    Types: Cigarettes    Quit date: 06/23/1999  . Smokeless tobacco: Former Systems developer    Types: Snuff, Chew  . Alcohol Use: No  . Drug Use: No  . Sexual Activity: Not Asked   Other Topics Concern  . None   Social History Narrative   Retired, Hotel manager   Married 42+ years   1 daughter   Former smoker     Family History:   Family History  Problem Relation Age of Onset  . Stroke Mother   . Heart attack Mother 54    deceased  . Prostate cancer Father 53      ROS:   Please see the history of present illness.   Review of Systems  Constitution: Positive for malaise/fatigue. Negative for decreased appetite.  HENT: Positive for nosebleeds.   Cardiovascular: Positive for dyspnea on exertion.  Respiratory: Positive for cough, shortness of breath  and wheezing.   Hematologic/Lymphatic: Bruises/bleeds easily.  Musculoskeletal: Positive for joint pain, joint swelling and myalgias.  Gastrointestinal: Positive for constipation.  Neurological: Positive for dizziness and loss of balance.  Psychiatric/Behavioral: The patient is nervous/anxious.   All other systems reviewed and are negative.     PHYSICAL EXAM: VS:  BP 154/82 mmHg  Pulse 77  Ht 5\' 10"  (1.778 m)  Wt 279 lb (126.554 kg)  BMI 40.03 kg/m2    Wt Readings from Last 3 Encounters:  04/24/15 279 lb (126.554 kg)  04/11/15 278 lb 6.4 oz (126.281 kg)  10/23/14 284 lb (128.822 kg)     GEN: Well nourished, well developed, in no acute distress HEENT: normal Neck: no JVD, no carotid bruits, no masses Cardiac:  Normal S1/ diminished to absent S2, RRR; harsh crescendo/decrescendo 2/6 systolic murmur RUSB,  no rubs or gallops, tight 1+ bilateral LE edema; diminished carotid pulses bilaterally  Respiratory:  Decreased breath sounds bilaterally, no wheezing, rhonchi or rales. GI: soft, nontender, nondistended, + BS MS: no deformity or atrophy Skin: warm and dry  Neuro:  CNs II-XII intact, Strength and sensation are intact Psych: Normal affect   EKG:  EKG is ordered today.  It demonstrates:   NSR, HR 77, normal axis, RSR' V1, septal Q waves, QTc 420 ms   Recent Labs: 04/11/2015: ALT 11; BUN 14; Creatinine, Ser 0.68; Hemoglobin 14.4; Platelets 233.0; Potassium 4.6; Sodium 141; TSH 2.62    Lipid Panel    Component Value Date/Time   CHOL 180 05/04/2014 0855   TRIG 89.0 05/04/2014 0855   HDL 47.00 05/04/2014 0855   CHOLHDL 4 05/04/2014 0855   VLDL 17.8 05/04/2014 0855   LDLCALC 115* 05/04/2014 0855      ASSESSMENT AND PLAN:  1. Severe Aortic Stenosis:   His family notes a hx of cardiac cath years ago at [facility] and he was told he has a bicuspid valve.  His current echo and exam are consistent with severe aortic stenosis.  He has symptoms of progressively worsening DOE and  decreased exercise tolerance as well as chest pressure. It is difficult to know if his symptoms are all related to his AS vs his COPD vs both.  I suspect his symptoms are related to both.  I reviewed his case with Dr. Liam Rogers today who also saw the patient.  He is not likely a candidate for traditional AVR.  He may be an acceptable candidate  for TAVR.  He will need to undergo cardiac cath initially to further evaluate.  Risks and benefits of cardiac catheterization have been discussed with the patient.  These include bleeding, infection, kidney damage, stroke, heart attack, death.  The patient understands these risks and is willing to proceed.  We will refer him to the Multidisciplinary Valve Clinic for evaluation and consideration of TAVR after his cardiac cath is done.  Start ASA 81 mg QD.   2. COPD:  On chronic O2.  Options may be limited and I assume his surgical risk will be high.  He will also likely need Pulmonary evaluation as part of his workup.   3. OSA on CPAP:  Continue CPAP.      Medication Changes: Current medicines are reviewed at length with the patient today.  Concerns regarding medicines are as outlined above.  The following changes have been made:   Discontinued Medications   No medications on file   Modified Medications   No medications on file   New Prescriptions   No medications on file   Labs/ tests ordered today include:   Orders Placed This Encounter  Procedures  . Basic Metabolic Panel (BMET)  . CBC w/Diff  . INR/PT  . EKG 12-Lead     Disposition:    FU with Dr. Lauree Chandler or Dr. [name] in the Multidisciplinary Valve Clinic in the next 2-3 weeks.     Signed, Versie Starks, MHS 04/24/2015 2:21 PM    [facility] [place], [place], [address]  [address] Phone: [phone]; Fax: [phone]     Attending Note:   The patient was seen and examined.  Agree with assessment and plan as noted above.   Changes made to the above note as needed.  We will schedule him for a cardiac catheterization for further evaluation of his coronary artery disease and his aortic valve. I think it is a relatively poor candidate for standard aortic valve replacement but may be a candidate for TAVR. I'll see him in followup and will make further arrangements.  Thayer Headings, Brooke Bonito., MD, [facility] 04/24/2015, 6:53 PM [address]  [place] Pager [phone]

## 2015-04-24 ENCOUNTER — Encounter: Payer: Self-pay | Admitting: *Deleted

## 2015-04-24 ENCOUNTER — Ambulatory Visit (INDEPENDENT_AMBULATORY_CARE_PROVIDER_SITE_OTHER): Payer: PPO | Admitting: Physician Assistant

## 2015-04-24 ENCOUNTER — Encounter: Payer: Self-pay | Admitting: Physician Assistant

## 2015-04-24 VITALS — BP 154/82 | HR 77 | Ht 70.0 in | Wt 279.0 lb

## 2015-04-24 DIAGNOSIS — I350 Nonrheumatic aortic (valve) stenosis: Secondary | ICD-10-CM

## 2015-04-24 DIAGNOSIS — R072 Precordial pain: Secondary | ICD-10-CM | POA: Diagnosis not present

## 2015-04-24 DIAGNOSIS — R0602 Shortness of breath: Secondary | ICD-10-CM

## 2015-04-24 DIAGNOSIS — G4733 Obstructive sleep apnea (adult) (pediatric): Secondary | ICD-10-CM

## 2015-04-24 DIAGNOSIS — J438 Other emphysema: Secondary | ICD-10-CM | POA: Diagnosis not present

## 2015-04-24 LAB — CBC WITH DIFFERENTIAL/PLATELET
Basophils Absolute: 0.1 10*3/uL (ref 0.0–0.1)
Basophils Relative: 1 % (ref 0–1)
EOS PCT: 2 % (ref 0–5)
Eosinophils Absolute: 0.1 10*3/uL (ref 0.0–0.7)
HEMATOCRIT: 40.8 % (ref 39.0–52.0)
HEMOGLOBIN: 13.5 g/dL (ref 13.0–17.0)
LYMPHS ABS: 1.7 10*3/uL (ref 0.7–4.0)
LYMPHS PCT: 25 % (ref 12–46)
MCH: 30.8 pg (ref 26.0–34.0)
MCHC: 33.1 g/dL (ref 30.0–36.0)
MCV: 92.9 fL (ref 78.0–100.0)
MPV: 9.3 fL (ref 8.6–12.4)
Monocytes Absolute: 0.5 10*3/uL (ref 0.1–1.0)
Monocytes Relative: 7 % (ref 3–12)
NEUTROS ABS: 4.5 10*3/uL (ref 1.7–7.7)
Neutrophils Relative %: 65 % (ref 43–77)
Platelets: 242 10*3/uL (ref 150–400)
RBC: 4.39 MIL/uL (ref 4.22–5.81)
RDW: 13.3 % (ref 11.5–15.5)
WBC: 6.9 10*3/uL (ref 4.0–10.5)

## 2015-04-24 LAB — BASIC METABOLIC PANEL
BUN: 8 mg/dL (ref 7–25)
CHLORIDE: 98 mmol/L (ref 98–110)
CO2: 34 mmol/L — ABNORMAL HIGH (ref 20–31)
Calcium: 9.8 mg/dL (ref 8.6–10.3)
Creat: 0.6 mg/dL — ABNORMAL LOW (ref 0.70–1.18)
Glucose, Bld: 86 mg/dL (ref 65–99)
POTASSIUM: 4.9 mmol/L (ref 3.5–5.3)
SODIUM: 142 mmol/L (ref 135–146)

## 2015-04-24 MED ORDER — ASPIRIN EC 81 MG PO TBEC: 81.0000 mg | DELAYED_RELEASE_TABLET | Freq: Every day | ORAL | Status: DC

## 2015-04-24 NOTE — Patient Instructions (Addendum)
Medication Instructions:  START ASPIRIN 81 MG DAILY  Labwork: TODAY BMET, CBC W/DIFF, PT/INR  Testing/Procedures: Your physician has requested that you have a cardiac catheterization DR. Burt Knack 05/02/15 @ 10 AM. Cardiac catheterization is used to diagnose and/or treat various heart conditions. Doctors may recommend this procedure for a number of different reasons. The most common reason is to evaluate chest pain. Chest pain can be a symptom of coronary artery disease (CAD), and cardiac catheterization can show whether plaque is narrowing or blocking your heart's arteries. This procedure is also used to evaluate the valves, as well as measure the blood flow and oxygen levels in different parts of your heart. For further information please visit [email or web address]. Please follow instruction sheet, as given.   Follow-Up: YOU ARE BEING REFERRED TO DR. Angelena Form OR DR. Burt Knack FOR CONSULT ; THIS WILL ALSO BE YOUR POST CATH FOLLOW UP AS WELL  Any Other Special Instructions Will Be Listed Below (If Applicable).     If you need a refill on your cardiac medications before your next appointment, please call your pharmacy.

## 2015-04-24 NOTE — Addendum Note (Signed)
Addended by: Michae Kava on: 04/24/2015 02:35 PM   Modules accepted: Orders

## 2015-04-25 ENCOUNTER — Telehealth: Payer: Self-pay | Admitting: *Deleted

## 2015-04-25 LAB — PROTIME-INR
INR: 1.02 (ref <1.50)
Prothrombin Time: 13.5 seconds (ref 11.6–15.2)

## 2015-04-25 NOTE — Telephone Encounter (Signed)
DPR on file for pt's wife who has been notified of lab results ok for cath for pt. Wife said thank you for the call today.

## 2015-05-01 ENCOUNTER — Telehealth: Payer: Self-pay | Admitting: Physician Assistant

## 2015-05-01 DIAGNOSIS — I10 Essential (primary) hypertension: Secondary | ICD-10-CM

## 2015-05-01 DIAGNOSIS — Z01812 Encounter for preprocedural laboratory examination: Secondary | ICD-10-CM

## 2015-05-01 NOTE — Telephone Encounter (Signed)
**Note De-Identified  Obfuscation** Per Richardson Dopp the pt is advised to see his PCP for his cold s/s and that it is ok to reschedule the pts cath to be done within the next 2 weeks.  The pt verbalized understanding and agrees with plan.  Tomorrow's cath has been canceled and rescheduled for 05/15/15 at 9 am with Dr Burt Knack. I went over his cath instructions with new date and time and answered all of his questions. He states that he will come to this office on 11/25 for his pre cath labs. BMET, CBCD and INR ordered and scheduled to be drawn on 05/11/15.

## 2015-05-01 NOTE — Telephone Encounter (Signed)
New message      Pt is calling to cancel his cath procedure for tomorrow.  He said he has a bad cold.  Please call to reschedule cath.

## 2015-05-11 ENCOUNTER — Other Ambulatory Visit (HOSPITAL_COMMUNITY)
Admission: RE | Admit: 2015-05-11 | Discharge: 2015-05-11 | Disposition: A | Discharge status: Home / Self Care | Payer: PPO | Source: Ambulatory Visit | Attending: Cardiovascular Disease | Admitting: Cardiovascular Disease

## 2015-05-11 ENCOUNTER — Other Ambulatory Visit: Payer: PPO

## 2015-05-11 DIAGNOSIS — Z029 Encounter for administrative examinations, unspecified: Secondary | ICD-10-CM | POA: Insufficient documentation

## 2015-05-11 LAB — BASIC METABOLIC PANEL
Anion gap: 5 (ref 5–15)
BUN: 10 mg/dL (ref 6–20)
CALCIUM: 9.1 mg/dL (ref 8.9–10.3)
CO2: 36 mmol/L — ABNORMAL HIGH (ref 22–32)
CREATININE: 0.71 mg/dL (ref 0.61–1.24)
Chloride: 100 mmol/L — ABNORMAL LOW (ref 101–111)
GFR calc Af Amer: >60 mL/min (ref >60)
Glucose, Bld: 91 mg/dL (ref 65–99)
POTASSIUM: 4.6 mmol/L (ref 3.5–5.1)
SODIUM: 141 mmol/L (ref 135–145)

## 2015-05-11 LAB — CBC WITH DIFFERENTIAL/PLATELET
BASOS ABS: 0 10*3/uL (ref 0.0–0.1)
Basophils Relative: 1 %
EOS ABS: 0.1 10*3/uL (ref 0.0–0.7)
EOS PCT: 2 %
HCT: 40.7 % (ref 39.0–52.0)
Hemoglobin: 12.9 g/dL — ABNORMAL LOW (ref 13.0–17.0)
Lymphocytes Relative: 28 %
Lymphs Abs: 1.7 10*3/uL (ref 0.7–4.0)
MCH: 31.2 pg (ref 26.0–34.0)
MCHC: 31.7 g/dL (ref 30.0–36.0)
MCV: 98.3 fL (ref 78.0–100.0)
MONO ABS: 0.5 10*3/uL (ref 0.1–1.0)
Monocytes Relative: 8 %
Neutro Abs: 3.7 10*3/uL (ref 1.7–7.7)
Neutrophils Relative %: 61 %
PLATELETS: 208 10*3/uL (ref 150–400)
RBC: 4.14 MIL/uL — AB (ref 4.22–5.81)
RDW: 13.4 % (ref 11.5–15.5)
WBC: 6 10*3/uL (ref 4.0–10.5)

## 2015-05-11 LAB — PROTIME-INR
INR: 1.05 (ref 0.00–1.49)
PROTHROMBIN TIME: 13.9 s (ref 11.6–15.2)

## 2015-05-15 ENCOUNTER — Ambulatory Visit (HOSPITAL_COMMUNITY)
Admission: RE | Admit: 2015-05-15 | Discharge: 2015-05-15 | Disposition: A | Discharge status: Home / Self Care | Payer: PPO | Source: Ambulatory Visit | Attending: Cardiovascular Disease | Admitting: Cardiovascular Disease

## 2015-05-15 ENCOUNTER — Encounter (HOSPITAL_COMMUNITY)
Admission: RE | Disposition: A | Discharge status: Home / Self Care | Payer: Self-pay | Source: Ambulatory Visit | Attending: Cardiovascular Disease

## 2015-05-15 ENCOUNTER — Other Ambulatory Visit: Payer: Self-pay | Admitting: *Deleted

## 2015-05-15 ENCOUNTER — Ambulatory Visit: Payer: PPO | Admitting: Pulmonary Disease

## 2015-05-15 DIAGNOSIS — F419 Anxiety disorder, unspecified: Secondary | ICD-10-CM | POA: Diagnosis not present

## 2015-05-15 DIAGNOSIS — R739 Hyperglycemia, unspecified: Secondary | ICD-10-CM | POA: Insufficient documentation

## 2015-05-15 DIAGNOSIS — Z7951 Long term (current) use of inhaled steroids: Secondary | ICD-10-CM | POA: Diagnosis not present

## 2015-05-15 DIAGNOSIS — Z9981 Dependence on supplemental oxygen: Secondary | ICD-10-CM | POA: Insufficient documentation

## 2015-05-15 DIAGNOSIS — J9610 Chronic respiratory failure, unspecified whether with hypoxia or hypercapnia: Secondary | ICD-10-CM | POA: Diagnosis not present

## 2015-05-15 DIAGNOSIS — J449 Chronic obstructive pulmonary disease, unspecified: Secondary | ICD-10-CM | POA: Insufficient documentation

## 2015-05-15 DIAGNOSIS — Z8249 Family history of ischemic heart disease and other diseases of the circulatory system: Secondary | ICD-10-CM | POA: Insufficient documentation

## 2015-05-15 DIAGNOSIS — I272 Other secondary pulmonary hypertension: Secondary | ICD-10-CM | POA: Diagnosis not present

## 2015-05-15 DIAGNOSIS — G4733 Obstructive sleep apnea (adult) (pediatric): Secondary | ICD-10-CM | POA: Diagnosis not present

## 2015-05-15 DIAGNOSIS — I350 Nonrheumatic aortic (valve) stenosis: Secondary | ICD-10-CM | POA: Diagnosis present

## 2015-05-15 DIAGNOSIS — Z87891 Personal history of nicotine dependence: Secondary | ICD-10-CM | POA: Insufficient documentation

## 2015-05-15 HISTORY — PX: CARDIAC CATHETERIZATION: SHX172

## 2015-05-15 LAB — POCT I-STAT 3, VENOUS BLOOD GAS (G3P V)
Acid-Base Excess: 2 mmol/L (ref 0.0–2.0)
Bicarbonate: 29.9 meq/L — ABNORMAL HIGH (ref 20.0–24.0)
O2 Saturation: 73 %
TCO2: 32 mmol/L (ref 0–100)
pCO2, Ven: 61.9 mmHg — ABNORMAL HIGH (ref 45.0–50.0)
pH, Ven: 7.292 (ref 7.250–7.300)
pO2, Ven: 44 mmHg (ref 30.0–45.0)

## 2015-05-15 SURGERY — RIGHT/LEFT HEART CATH AND CORONARY ANGIOGRAPHY

## 2015-05-15 MED ORDER — VERAPAMIL HCL 2.5 MG/ML IV SOLN
INTRAVENOUS | Status: AC
  Filled 2015-05-15: qty 2

## 2015-05-15 MED ORDER — HEPARIN SODIUM (PORCINE) 1000 UNIT/ML IJ SOLN
INTRAMUSCULAR | Status: DC | PRN
  Administered 2015-05-15: 5000 [IU] via INTRAVENOUS

## 2015-05-15 MED ORDER — SODIUM CHLORIDE 0.9 % IV SOLN: 250.0000 mL | INTRAVENOUS | Status: DC | PRN

## 2015-05-15 MED ORDER — MIDAZOLAM HCL 2 MG/2ML IJ SOLN
INTRAMUSCULAR | Status: AC
  Filled 2015-05-15: qty 2

## 2015-05-15 MED ORDER — SODIUM CHLORIDE 0.9 % IJ SOLN: 3.0000 mL | Freq: Two times a day (BID) | INTRAMUSCULAR | Status: DC

## 2015-05-15 MED ORDER — LIDOCAINE HCL (PF) 1 % IJ SOLN
INTRAMUSCULAR | Status: AC
  Filled 2015-05-15: qty 30

## 2015-05-15 MED ORDER — HEPARIN SODIUM (PORCINE) 1000 UNIT/ML IJ SOLN
INTRAMUSCULAR | Status: AC
  Filled 2015-05-15: qty 1

## 2015-05-15 MED ORDER — FENTANYL CITRATE (PF) 100 MCG/2ML IJ SOLN
INTRAMUSCULAR | Status: AC
  Filled 2015-05-15: qty 2

## 2015-05-15 MED ORDER — ASPIRIN 81 MG PO CHEW
CHEWABLE_TABLET | ORAL | Status: AC
  Filled 2015-05-15: qty 1

## 2015-05-15 MED ORDER — SODIUM CHLORIDE 0.9 % IJ SOLN: 3.0000 mL | INTRAMUSCULAR | Status: DC | PRN

## 2015-05-15 MED ORDER — SODIUM CHLORIDE 0.9 % IV SOLN
INTRAVENOUS | Status: DC
  Administered 2015-05-15: 1000 mL via INTRAVENOUS

## 2015-05-15 MED ORDER — LIDOCAINE HCL (PF) 1 % IJ SOLN
INTRAMUSCULAR | Status: DC | PRN
  Administered 2015-05-15: 11:00:00

## 2015-05-15 MED ORDER — SODIUM CHLORIDE 0.9 % IJ SOLN
3.0000 mL | Freq: Two times a day (BID) | INTRAMUSCULAR | Status: DC
Start: 2015-05-15 — End: 2015-05-15

## 2015-05-15 MED ORDER — VERAPAMIL HCL 2.5 MG/ML IV SOLN
INTRAVENOUS | Status: DC | PRN
  Administered 2015-05-15: 10:00:00 via INTRA_ARTERIAL

## 2015-05-15 MED ORDER — ONDANSETRON HCL 4 MG/2ML IJ SOLN: 4.0000 mg | Freq: Four times a day (QID) | INTRAMUSCULAR | Status: DC | PRN

## 2015-05-15 MED ORDER — FENTANYL CITRATE (PF) 100 MCG/2ML IJ SOLN
INTRAMUSCULAR | Status: DC | PRN
  Administered 2015-05-15: 25 ug via INTRAVENOUS

## 2015-05-15 MED ORDER — SODIUM CHLORIDE 0.9 % IV SOLN: INTRAVENOUS | Status: DC

## 2015-05-15 MED ORDER — HEPARIN (PORCINE) IN NACL 2-0.9 UNIT/ML-% IJ SOLN
INTRAMUSCULAR | Status: AC
  Filled 2015-05-15: qty 1000

## 2015-05-15 MED ORDER — ASPIRIN 81 MG PO CHEW
81.0000 mg | CHEWABLE_TABLET | ORAL | Status: AC
  Administered 2015-05-15: 81 mg via ORAL

## 2015-05-15 MED ORDER — ACETAMINOPHEN 325 MG PO TABS: 650.0000 mg | ORAL_TABLET | ORAL | Status: DC | PRN

## 2015-05-15 MED ORDER — MIDAZOLAM HCL 2 MG/2ML IJ SOLN
INTRAMUSCULAR | Status: DC | PRN
  Administered 2015-05-15: 1 mg via INTRAVENOUS

## 2015-05-15 SURGICAL SUPPLY — 16 items
CATH BALLN WEDGE 5F 110CM (CATHETERS) ×3 IMPLANT
CATH INFINITI 5 FR AL2 (CATHETERS) ×3 IMPLANT
CATH INFINITI 5FR JL5 (CATHETERS) ×2 IMPLANT
CATH INFINITI 5FR MULTPACK ANG (CATHETERS) ×2 IMPLANT
DEVICE RAD COMP TR BAND LRG (VASCULAR PRODUCTS) ×3 IMPLANT
GLIDESHEATH SLEND SS 6F .021 (SHEATH) ×3 IMPLANT
KIT HEART LEFT (KITS) ×3 IMPLANT
KIT HEART RIGHT NAMIC (KITS) ×3 IMPLANT
PACK CARDIAC CATHETERIZATION (CUSTOM PROCEDURE TRAY) ×3 IMPLANT
SHEATH FAST CATH BRACH 5F 5CM (SHEATH) ×2 IMPLANT
SYR MEDRAD MARK V 150ML (SYRINGE) ×3 IMPLANT
TRANSDUCER W/STOPCOCK (MISCELLANEOUS) ×3 IMPLANT
TUBING CIL FLEX 10 FLL-RA (TUBING) ×3 IMPLANT
WIRE EMERALD ST .035X260CM (WIRE) ×2 IMPLANT
WIRE HI TORQ VERSACORE-J 145CM (WIRE) ×3 IMPLANT
WIRE SAFE-T 1.5MM-J .035X260CM (WIRE) ×3 IMPLANT

## 2015-05-15 NOTE — H&P (View-Only) (Signed)
 Cardiology Office Note   Date:  04/24/2015   ID:  [name] [name], DOB 11/26/1933, MRN [id number]   Patient Care Team: Graham S Duncan, MD as PCP - General (Family Medicine) Scott M Nadel, MD as Consulting Physician (Pulmonary Disease) Philip J Nahser, MD as Consulting Physician (Cardiology)    Chief Complaint  Patient presents with  . Aortic Stenosis    New Patient     History of Present Illness: [name] [name] is a 81 y.o. male with a hx of chronic respiratory failure on home O2, COPD, OSA on CPAP, former smoking.  Recently evaluated by Pulmonology for worsening dyspnea.  His exam was notable for systolic murmur and Echo demonstrated severe AS with a mean gradient of 51 mmHg.  He is referred for evaluation.    He has chronic dyspnea. He feels that this has gotten worse over the last 6-12 mos. He is now short of breath with any activity.  He is NYHA 3.  He denies syncope. He notes some chest pressure with activity over the past 6 mos.  He denies orthopnea, PND.  He sleeps with CPAP. He has chronic LE edema.  He has noticed this since he took Celebrex years ago.  It is stable without worsening.     Studies/Reports Reviewed Today:  Echo 04/18/15 Mod LVH, EF 55-60%, no RWMA, Gr 1 DD, severe AS (mean 51 mmHg; peak 86 mmHg), MAC, mild LAE   Past Medical History  Diagnosis Date  . OSA on CPAP   . Hyperglycemia   . COPD (chronic obstructive pulmonary disease) (HCC)     on home O2 4LPM  . Anxiety   . Hemorrhoids   . Diverticulosis   . Kidney stones   . PUD (peptic ulcer disease)   . Former smoker     quit 2001  . Right knee DJD 10/31/2011    Severe  . Severe aortic stenosis     a. Echo 11/16:  Mod LVH, EF 55-60%, no RWMA, Gr 1 DD, severe AS (mean 51 mmHg; peak 86 mmHg), MAC, mild LAE    Past Surgical History  Procedure Laterality Date  . Knee surgery    . Cholecystectomy    . Tonsillectomy       Current Outpatient Prescriptions  Medication Sig Dispense  Refill  . guaiFENesin (MUCINEX) 600 MG 12 hr tablet Take 600 mg by mouth daily.    . LORazepam (ATIVAN) 1 MG tablet TAKE 1/4 TO 1/2 TABLET BY MOUTH EVERY 6 HOURS AS NEEDED 30 tablet 2  . OXYGEN Inhale into the lungs. 4 Liters    . PROAIR HFA 108 (90 BASE) MCG/ACT inhaler INHALE 1-2 PUFFS INTO THE LUNGS EVERY 6 (SIX) HOURS AS NEEDED FOR WHEEZING. 8.5 each 3  . Respiratory Therapy Supplies (FLUTTER) DEVI Use as directed 1 each 0  . SYMBICORT 160-4.5 MCG/ACT inhaler INHALE 2 PUFFS BY MOUTH TWICE A DAY 10.2 Inhaler 5  . tiotropium (SPIRIVA HANDIHALER) 18 MCG inhalation capsule Place 1 capsule (18 mcg total) into inhaler and inhale daily. 30 capsule 6   No current facility-administered medications for this visit.    Allergies:   Atorvastatin; Celecoxib; Nitrofurantoin; Sulfa antibiotics; and Sulindac    Social History:   Social History   Social History  . Marital Status: Married    Spouse Name: N/A  . Number of Children: 1  . Years of Education: N/A   Occupational History  . 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 Smoking status: Former Smoker -- 1.50 packs/day for 53 years    Types: Cigarettes    Quit date: 06/23/1999  . Smokeless tobacco: Former User    Types: Snuff, Chew  . Alcohol Use: No  . Drug Use: No  . Sexual Activity: Not Asked   Other Topics Concern  . None   Social History Narrative   Retired, print shop   Married 55+ years   1 daughter   Former smoker     Family History:   Family History  Problem Relation Age of Onset  . Stroke Mother   . Heart attack Mother 57    deceased  . Prostate cancer Father 63      ROS:   Please see the history of present illness.   Review of Systems  Constitution: Positive for malaise/fatigue. Negative for decreased appetite.  HENT: Positive for nosebleeds.   Cardiovascular: Positive for dyspnea on exertion.  Respiratory: Positive for cough, shortness of breath  and wheezing.   Hematologic/Lymphatic: Bruises/bleeds easily.  Musculoskeletal: Positive for joint pain, joint swelling and myalgias.  Gastrointestinal: Positive for constipation.  Neurological: Positive for dizziness and loss of balance.  Psychiatric/Behavioral: The patient is nervous/anxious.   All other systems reviewed and are negative.     PHYSICAL EXAM: VS:  BP 154/82 mmHg  Pulse 77  Ht 5' 10" (1.778 m)  Wt 279 lb (126.554 kg)  BMI 40.03 kg/m2    Wt Readings from Last 3 Encounters:  04/24/15 279 lb (126.554 kg)  04/11/15 278 lb 6.4 oz (126.281 kg)  10/23/14 284 lb (128.822 kg)     GEN: Well nourished, well developed, in no acute distress HEENT: normal Neck: no JVD, no carotid bruits, no masses Cardiac:  Normal S1/ diminished to absent S2, RRR; harsh crescendo/decrescendo 2/6 systolic murmur RUSB,  no rubs or gallops, tight 1+ bilateral LE edema; diminished carotid pulses bilaterally  Respiratory:  Decreased breath sounds bilaterally, no wheezing, rhonchi or rales. GI: soft, nontender, nondistended, + BS MS: no deformity or atrophy Skin: warm and dry  Neuro:  CNs II-XII intact, Strength and sensation are intact Psych: Normal affect   EKG:  EKG is ordered today.  It demonstrates:   NSR, HR 77, normal axis, RSR' V1, septal Q waves, QTc 420 ms   Recent Labs: 04/11/2015: ALT 11; BUN 14; Creatinine, Ser 0.68; Hemoglobin 14.4; Platelets 233.0; Potassium 4.6; Sodium 141; TSH 2.62    Lipid Panel    Component Value Date/Time   CHOL 180 05/04/2014 0855   TRIG 89.0 05/04/2014 0855   HDL 47.00 05/04/2014 0855   CHOLHDL 4 05/04/2014 0855   VLDL 17.8 05/04/2014 0855   LDLCALC 115* 05/04/2014 0855      ASSESSMENT AND PLAN:  1. Severe Aortic Stenosis:   His family notes a hx of cardiac cath years ago at [facility] and he was told he has a bicuspid valve.  His current echo and exam are consistent with severe aortic stenosis.  He has symptoms of progressively worsening DOE and  decreased exercise tolerance as well as chest pressure. It is difficult to know if his symptoms are all related to his AS vs his COPD vs both.  I suspect his symptoms are related to both.  I reviewed his case with Dr. Phil Nahser today who also saw the patient.  He is not likely a candidate for traditional AVR.  He may be an acceptable candidate   for TAVR.  He will need to undergo cardiac cath initially to further evaluate.  Risks and benefits of cardiac catheterization have been discussed with the patient.  These include bleeding, infection, kidney damage, stroke, heart attack, death.  The patient understands these risks and is willing to proceed.  We will refer him to the Multidisciplinary Valve Clinic for evaluation and consideration of TAVR after his cardiac cath is done.  Start ASA 81 mg QD.   2. COPD:  On chronic O2.  Options may be limited and I assume his surgical risk will be high.  He will also likely need Pulmonary evaluation as part of his workup.   3. OSA on CPAP:  Continue CPAP.      Medication Changes: Current medicines are reviewed at length with the patient today.  Concerns regarding medicines are as outlined above.  The following changes have been made:   Discontinued Medications   No medications on file   Modified Medications   No medications on file   New Prescriptions   No medications on file   Labs/ tests ordered today include:   Orders Placed This Encounter  Procedures  . Basic Metabolic Panel (BMET)  . CBC w/Diff  . INR/PT  . EKG 12-Lead     Disposition:    FU with Dr. Christopher McAlhany or Dr. Michael Cooper in the Multidisciplinary Valve Clinic in the next 2-3 weeks.     Signed, Scott Weaver, PA-C, MHS 04/24/2015 2:21 PM    [facility] [address], [place], [place]  [address] Phone: [phone]; Fax: [phone]     Attending Note:   The patient was seen and examined.  Agree with assessment and plan as noted above.   Changes made to the above note as needed.  We will schedule him for a cardiac catheterization for further evaluation of his coronary artery disease and his aortic valve. I think it is a relatively poor candidate for standard aortic valve replacement but may be a candidate for TAVR. I'll see him in followup and will make further arrangements.  Philip J. Nahser, Jr., MD, FACC 04/24/2015, 6:53 PM [address],  [address] Office - [phone] Pager [phone]    

## 2015-05-15 NOTE — Discharge Instructions (Signed)
Radial Site Care °Refer to this sheet in the next few weeks. These instructions provide you with information about caring for yourself after your procedure. Your health care provider may also give you more specific instructions. Your treatment has been planned according to current medical practices, but problems sometimes occur. Call your health care provider if you have any problems or questions after your procedure. °WHAT TO EXPECT AFTER THE PROCEDURE °After your procedure, it is typical to have the following: °· Bruising at the radial site that usually fades within 1-2 weeks. °· Blood collecting in the tissue (hematoma) that may be painful to the touch. It should usually decrease in size and tenderness within 1-2 weeks. °HOME CARE INSTRUCTIONS °· Take medicines only as directed by your health care provider. °· You may shower 24-48 hours after the procedure or as directed by your health care provider. Remove the bandage (dressing) and gently wash the site with plain soap and water. Pat the area dry with a clean towel. Do not rub the site, because this may cause bleeding. °· Do not take baths, swim, or use a hot tub until your health care provider approves. °· Check your insertion site every day for redness, swelling, or drainage. °· Do not apply powder or lotion to the site. °· Do not flex or bend the affected arm for 24 hours or as directed by your health care provider. °· Do not push or pull heavy objects with the affected arm for 24 hours or as directed by your health care provider. °· Do not lift over 10 lb (4.5 kg) for 5 days after your procedure or as directed by your health care provider. °· Ask your health care provider when it is okay to: °¨ Return to work or school. °¨ Resume usual physical activities or sports. °¨ Resume sexual activity. °· Do not drive home if you are discharged the same day as the procedure. Have someone else drive you. °· You may drive 24 hours after the procedure unless otherwise  instructed by your health care provider. °· Do not operate machinery or power tools for 24 hours after the procedure. °· If your procedure was done as an outpatient procedure, which means that you went home the same day as your procedure, a responsible adult should be with you for the first 24 hours after you arrive home. °· Keep all follow-up visits as directed by your health care provider. This is important. °SEEK MEDICAL CARE IF: °· You have a fever. °· You have chills. °· You have increased bleeding from the radial site. Hold pressure on the site. °SEEK IMMEDIATE MEDICAL CARE IF: °· You have unusual pain at the radial site. °· You have redness, warmth, or swelling at the radial site. °· You have drainage (other than a small amount of blood on the dressing) from the radial site. °· The radial site is bleeding, and the bleeding does not stop after 30 minutes of holding steady pressure on the site. °· Your arm or hand becomes pale, cool, tingly, or numb. °  °This information is not intended to replace advice given to you by your health care provider. Make sure you discuss any questions you have with your health care provider. °  °Document Released: 07/05/2010 Document Revised: 06/23/2014 Document Reviewed: 12/19/2013 °Elsevier Interactive Patient Education ©2016 Elsevier Inc[employer] ° °

## 2015-05-15 NOTE — Interval H&P Note (Signed)
History and Physical Interval Note:  05/15/2015 9:59 AM  [name] [name]  has presented today for surgery, with the diagnosis of artoic stenosis/shortness of breath  The various methods of treatment have been discussed with the patient and family. After consideration of risks, benefits and other options for treatment, the patient has consented to  Procedure(s): Right/Left Heart Cath and Coronary Angiography (N/A) as a surgical intervention .  The patient's history has been reviewed, patient examined, no change in status, stable for surgery.  I have reviewed the patient's chart and labs.  Questions were answered to the patient's satisfaction.     [name]

## 2015-05-16 ENCOUNTER — Encounter (HOSPITAL_COMMUNITY): Payer: Self-pay | Admitting: Cardiovascular Disease

## 2015-05-16 LAB — POCT I-STAT 3, ART BLOOD GAS (G3+)
Acid-Base Excess: 8 mmol/L — ABNORMAL HIGH (ref 0.0–2.0)
Bicarbonate: 35.8 mEq/L — ABNORMAL HIGH (ref 20.0–24.0)
O2 SAT: 97 %
PCO2 ART: 67.1 mmHg — AB (ref 35.0–45.0)
PO2 ART: 95 mmHg (ref 80.0–100.0)
TCO2: 38 mmol/L (ref 0–100)
pH, Arterial: 7.336 — ABNORMAL LOW (ref 7.350–7.450)

## 2015-05-21 ENCOUNTER — Ambulatory Visit (HOSPITAL_COMMUNITY): Payer: PPO

## 2015-05-21 ENCOUNTER — Ambulatory Visit (HOSPITAL_COMMUNITY)
Admission: RE | Admit: 2015-05-21 | Discharge: 2015-05-21 | Disposition: A | Discharge status: Home / Self Care | Payer: PPO | Source: Ambulatory Visit | Attending: Cardiovascular Disease | Admitting: Cardiovascular Disease

## 2015-05-21 ENCOUNTER — Ambulatory Visit (HOSPITAL_COMMUNITY): Admission: RE | Admit: 2015-05-21 | Payer: PPO | Source: Ambulatory Visit

## 2015-05-21 DIAGNOSIS — I358 Other nonrheumatic aortic valve disorders: Secondary | ICD-10-CM | POA: Insufficient documentation

## 2015-05-21 DIAGNOSIS — I350 Nonrheumatic aortic (valve) stenosis: Secondary | ICD-10-CM | POA: Insufficient documentation

## 2015-05-21 MED ORDER — IOHEXOL 350 MG/ML SOLN: 80.0000 mL | Freq: Once | INTRAVENOUS | Status: DC | PRN

## 2015-05-21 MED ORDER — NITROGLYCERIN 0.4 MG SL SUBL
SUBLINGUAL_TABLET | SUBLINGUAL | Status: AC
  Filled 2015-05-21: qty 1

## 2015-05-21 MED ORDER — METOPROLOL TARTRATE 1 MG/ML IV SOLN
INTRAVENOUS | Status: AC
  Administered 2015-05-21: 5 mg via INTRAVENOUS
  Filled 2015-05-21: qty 5

## 2015-05-21 MED ORDER — METOPROLOL TARTRATE 1 MG/ML IV SOLN
5.0000 mg | Freq: Once | INTRAVENOUS | Status: AC
  Administered 2015-05-21: 5 mg via INTRAVENOUS
  Filled 2015-05-21: qty 5

## 2015-05-21 NOTE — Progress Notes (Signed)
This section of note is entered in error.

## 2015-05-22 ENCOUNTER — Ambulatory Visit (HOSPITAL_COMMUNITY)
Admission: RE | Admit: 2015-05-22 | Discharge: 2015-05-22 | Disposition: A | Discharge status: Home / Self Care | Payer: PPO | Source: Ambulatory Visit | Attending: Cardiovascular Disease | Admitting: Cardiovascular Disease

## 2015-05-22 ENCOUNTER — Ambulatory Visit (HOSPITAL_COMMUNITY): Payer: PPO

## 2015-05-22 DIAGNOSIS — I2510 Atherosclerotic heart disease of native coronary artery without angina pectoris: Secondary | ICD-10-CM | POA: Insufficient documentation

## 2015-05-22 DIAGNOSIS — K760 Fatty (change of) liver, not elsewhere classified: Secondary | ICD-10-CM | POA: Insufficient documentation

## 2015-05-22 DIAGNOSIS — K5730 Diverticulosis of large intestine without perforation or abscess without bleeding: Secondary | ICD-10-CM | POA: Diagnosis not present

## 2015-05-22 DIAGNOSIS — I350 Nonrheumatic aortic (valve) stenosis: Secondary | ICD-10-CM | POA: Diagnosis not present

## 2015-05-22 DIAGNOSIS — I358 Other nonrheumatic aortic valve disorders: Secondary | ICD-10-CM | POA: Diagnosis not present

## 2015-05-22 DIAGNOSIS — Q245 Malformation of coronary vessels: Secondary | ICD-10-CM | POA: Diagnosis not present

## 2015-05-22 DIAGNOSIS — I714 Abdominal aortic aneurysm, without rupture: Secondary | ICD-10-CM | POA: Insufficient documentation

## 2015-05-22 MED ORDER — METOPROLOL TARTRATE 1 MG/ML IV SOLN
5.0000 mg | Freq: Once | INTRAVENOUS | Status: AC
  Administered 2015-05-22: 5 mg via INTRAVENOUS
  Filled 2015-05-22: qty 5

## 2015-05-22 MED ORDER — NITROGLYCERIN 0.4 MG SL SUBL
0.4000 mg | SUBLINGUAL_TABLET | Freq: Once | SUBLINGUAL | Status: AC
  Administered 2015-05-22: 0.4 mg via SUBLINGUAL
  Filled 2015-05-22: qty 25

## 2015-05-22 MED ORDER — NITROGLYCERIN 0.4 MG SL SUBL
SUBLINGUAL_TABLET | SUBLINGUAL | Status: AC
  Filled 2015-05-22: qty 1

## 2015-05-22 MED ORDER — METOPROLOL TARTRATE 1 MG/ML IV SOLN
INTRAVENOUS | Status: AC
  Filled 2015-05-22: qty 5

## 2015-05-22 MED ORDER — METOPROLOL TARTRATE 1 MG/ML IV SOLN
INTRAVENOUS | Status: DC
Start: 2015-05-22 — End: 2015-05-23
  Filled 2015-05-22: qty 5

## 2015-05-22 MED ORDER — IOHEXOL 350 MG/ML SOLN
80.0000 mL | Freq: Once | INTRAVENOUS | Status: AC | PRN
  Administered 2015-05-22: 80 mL via INTRAVENOUS

## 2015-05-22 NOTE — Progress Notes (Signed)
CT scan completed. Tolerated well. Refused PO. D/C home in wheelchair with wife. Awake and alert. In no distress.

## 2015-05-22 NOTE — Progress Notes (Signed)
Pt awake and alert. In no distress. States he took 13hr prep for scan this am.

## 2015-05-23 ENCOUNTER — Ambulatory Visit: Payer: PPO | Attending: Cardiovascular Disease | Admitting: Physical Therapy

## 2015-05-23 ENCOUNTER — Institutional Professional Consult (permissible substitution) (INDEPENDENT_AMBULATORY_CARE_PROVIDER_SITE_OTHER): Payer: PPO | Admitting: Surgery

## 2015-05-23 ENCOUNTER — Encounter: Payer: Self-pay | Admitting: Physical Therapy

## 2015-05-23 ENCOUNTER — Encounter: Payer: Self-pay | Admitting: Surgery

## 2015-05-23 VITALS — BP 126/88 | HR 96 | Resp 20 | Ht 70.0 in | Wt 270.0 lb

## 2015-05-23 DIAGNOSIS — I350 Nonrheumatic aortic (valve) stenosis: Secondary | ICD-10-CM | POA: Insufficient documentation

## 2015-05-23 DIAGNOSIS — R269 Unspecified abnormalities of gait and mobility: Secondary | ICD-10-CM | POA: Insufficient documentation

## 2015-05-23 DIAGNOSIS — M6281 Muscle weakness (generalized): Secondary | ICD-10-CM | POA: Insufficient documentation

## 2015-05-23 NOTE — Therapy (Signed)
[place], [place], [address] Phone: [phone]   Fax:  [phone]  Physical Therapy Evaluation  Patient Details  Name: [name] [name] MRN: [id number] Date of Birth: February 05, 1934 Referring Provider: Dr. [name]  Encounter Date: 05/23/2015      PT End of Session - 05/23/15 1245    Visit Number 1   PT Start Time [id number]   PT Stop Time 1220   PT Time Calculation (min) 49 min      Past Medical History  Diagnosis Date  . OSA on CPAP   . Hyperglycemia   . COPD (chronic obstructive pulmonary disease) (HCC)     on home O2 4LPM  . Anxiety   . Hemorrhoids   . Diverticulosis   . Kidney stones   . PUD (peptic ulcer disease)   . Former smoker     quit 2001  . Right knee DJD 10/31/2011    Severe  . Severe aortic stenosis     a. Echo 11/16:  Mod LVH, EF 55-60%, no RWMA, Gr 1 DD, severe AS (mean 51 mmHg; peak 86 mmHg), MAC, mild LAE    Past Surgical History  Procedure Laterality Date  . Knee surgery    . Cholecystectomy    . Tonsillectomy    . Cardiac catheterization N/A 05/15/2015    Procedure: Right/Left Heart Cath and Coronary Angiography;  Surgeon: [name], MD;  Location: [facility];  Service: Cardiovascular;  Laterality: N/A;    There were no vitals filed for this visit.  Visit Diagnosis:  Abnormality of gait - Plan: PT plan of care cert/re-cert  Generalized muscle weakness - Plan: PT plan of care cert/re-cert  Severe aortic stenosis - Plan: PT plan of care cert/re-cert      Subjective Assessment - 05/23/15 1134    Subjective saw Dr. [name] 10/26 who noticed a severe heart murmur and subsequently referred to a cardiologist and pt is currently undergoing medical workup for severe aortic stenosis   Pertinent History has been on supplemental O2 at 4L as needed last 10-12 years due to COPD,  has used rollator walker about a year now   Patient Stated Goals address heart  problems   Currently in Pain? No/denies            [facility] PT Assessment - 05/23/15 0001    Assessment   Medical Diagnosis severe aortic stenosis   Referring Provider Dr. [name]   Onset Date/Surgical Date 04/11/15   Precautions   Precautions Fall   Restrictions   Weight Bearing Restrictions No   Balance Screen   Has the patient fallen in the past 6 months Yes   How many times? 1   Has the patient had a decrease in activity level because of a fear of falling?  No   Is the patient reluctant to leave their home because of a fear of falling?  No   Home Environment   Living Environment Private residence   Living Arrangements Spouse/significant other   [place] to enter   Entrance Stairs-Number of Steps 1   Entrance Stairs-Rails None  holds to door   Prior Function   Level of Independence Independent with household mobility with device;Independent with community mobility with device   Posture/Postural Control   Posture/Postural Control Postural limitations   Postural Limitations Rounded Shoulders;Forward head   ROM / Strength   AROM / PROM / Strength AROM;Strength   AROM   Overall  AROM Comments grossly WNL   Strength   Overall Strength Comments UE grossly 4/5, lower extremity weakner with hips and ankles 3/5, knees 4/5   Strength Assessment Site Hand   Right/Left hand Right;Left   Right Hand Grip (lbs) 75   Left Hand Grip (lbs) 55   Ambulation/Gait   Gait Pattern Trunk flexed;Poor foot clearance - left;Poor foot clearance - right  significant foot slap bil.          [facility] Pre-Surgical Assessment - 05/23/15 0001    5 Meter Walk Test- trial 1 7.1 sec   5 Meter Walk Test- trial 2 6 sec.    5 Meter Walk Test- trial 3 7.3 sec.  with rollator (>6 sec indicates slow speed)   5 meter walk test average 6.8 sec   Timed Up & Go Test trial  21.5 sec.   Comments with rollator (> 12 sec indicative of high fall risk)   4 Stage Balance Test tolerated for:  10 sec.    4 Stage Balance Test Position 2   comment Inability to hold position 3 x 10 seconds indicates increased fall risk   Comment unable - requires UE assist for sit to stand   ADL/IADL Independent with: Bathing;Dressing;Meal prep   ADL/IADL Needs Assistance with: Valla Leaver work   ADL/IADL Fraility Index Midly frail   6 Minute Walk- Baseline yes   BP (mmHg) 118/78 mmHg   HR (bpm) 68   02 Sat (%RA) 97 %   Modified Borg Scale for Dyspnea 0- Nothing at all   Perceived Rate of Exertion (Borg) 6-   6 Minute Walk Post Test yes   BP (mmHg) 138/84 mmHg   HR (bpm) 105   02 Sat (%RA) 95 %   Modified Borg Scale for Dyspnea 2- Mild shortness of breath   Perceived Rate of Exertion (Borg) 11- Fairly light   Aerobic Endurance Distance Walked 615   Endurance additional comments Pt required seated rest break at 555' (4:15) due to legs hurting (from knee down). Pt rested 1:15 before resuming for last 30 seconds.                         PT Education - 05/23/15 1245    Education provided Yes   Education Details fall risk, ankle weakness, importance of use of supplemental O2   Person(s) Educated Patient;Spouse   Methods Explanation   Comprehension Verbalized understanding                    Plan - 05/23/15 1246    Clinical Impression Statement Pt is a 81 yo male presenting to OP PT for evaluation prior to possible TAVR surgery due to severe aortic stenosis. Pt reports a general decline in mobility over the past 6-12 months and is having increased difficulty walking on uneven surfaces, general imbalance, lower extremity fatigue with ambulation, and SOB. Pt is early in the medical workup for possible TAVR surgery after pulmonologist discovered a severe heart murmur in late October and subsequently referring him to cardiologist per pt and wife report. Pt presents with good ROM and UE strength, fair LE strength, fair to poor balance, and moderately limited ambulation distances. Pt is at  signficant fall risk per Timed up and go and 4 stage balance test. He demonstrates slow walking speed. His BP increased fairly significantly with 6 minute walk test. Pt also demonstrates moderate redness in his legs bil. mid calf and below as well  as a significant foot slap when he ambulates. Pt does ambulate using a rollator walker which decreases his fall risk somewhat. Given his lower extremity weakness and decr. sensation in his LE, he is still at a moderate fall risk. Pt and wife reported intermittent use of supplemental O2. When asked how low his oximeter reads before putting it on, he and his wife reported sometimes down into the 36s. Patient/wife educated on improtance of use and dangers of desaturation occurring when O2 levels remained below 88-90%.     PT Frequency One time visit   Consulted and Agree with Plan of Care Patient;Family member/caregiver          G-Codes - Jun 21, 2015 1259    Functional Assessment Tool Used 6 minute walk 615' with rollator   Functional Limitation Mobility: Walking and moving around   Mobility: Walking and Moving Around Current Status [phone]) At least 20 percent but less than 40 percent impaired, limited or restricted   Mobility: Walking and Moving Around Goal Status [phone]) At least 20 percent but less than 40 percent impaired, limited or restricted   Mobility: Walking and Moving Around Discharge Status [phone]) At least 20 percent but less than 40 percent impaired, limited or restricted       Problem List Patient Active Problem List   Diagnosis Date Noted  . Severe aortic stenosis 05/15/2015  . Medicare annual wellness visit, subsequent 05/21/2014  . Advance care planning 05/21/2014  . Skin irritation 12/20/2013  . Risk for falls 12/20/2013  . Rib pain on right side 01/29/2013  . Vertigo 07/01/2012  . Right knee DJD 10/31/2011  . Knee pain 10/23/2011  . OSA (obstructive sleep apnea) 07/31/2009  . Chronic respiratory failure, unspecified whether with  hypoxia or hypercapnia ([place]) 08/03/2008  . MORBID OBESITY 02/22/2008  . POLYP, COLON 08/27/2007  . ANXIETY 08/27/2007  . COPD (chronic obstructive pulmonary disease) with emphysema ([place]) 08/27/2007  . INTERNAL HEMORRHOIDS 03/23/2007  . DIVERTICULOSIS, COLON 03/23/2007  . SYMPTOM, PAIN, ABDOMINAL, GENERALIZED 02/18/2007  . HYPERGLYCEMIA 02/18/2007  . HYPERTENSION, BENIGN ESSENTIAL 02/10/2007  . NEOPLASM, SKIN, UNCERTAIN BEHAVIOR [id number]  . PEPTIC ULCER DISEASE WITH H-PYLORI  TX'D 11/19/2006  . NEPHROLITHIASIS 11/19/2006  . BACK PAIN, CHRONIC 11/19/2006  . BENIGN PROSTATIC HYPERTROPHY, HX OF 11/19/2006    Romualdo Bolk, PT 06-21-2015, 1:02 PM  [facility] [address] [place], [place], [address] Phone: [phone]   Fax:  [phone]  Name: Aarjav Schechter MRN: [id number] Date of Birth: [birth year]

## 2015-05-24 ENCOUNTER — Other Ambulatory Visit: Payer: Self-pay | Admitting: *Deleted

## 2015-05-24 DIAGNOSIS — I350 Nonrheumatic aortic (valve) stenosis: Secondary | ICD-10-CM

## 2015-05-25 ENCOUNTER — Ambulatory Visit (INDEPENDENT_AMBULATORY_CARE_PROVIDER_SITE_OTHER): Payer: PPO | Admitting: Cardiovascular Disease

## 2015-05-25 VITALS — BP 144/76 | HR 119 | Ht 70.0 in | Wt 273.0 lb

## 2015-05-25 DIAGNOSIS — I350 Nonrheumatic aortic (valve) stenosis: Secondary | ICD-10-CM

## 2015-05-25 MED ORDER — FUROSEMIDE 20 MG PO TABS: 20.0000 mg | ORAL_TABLET | Freq: Every day | ORAL | Status: DC

## 2015-05-25 NOTE — Patient Instructions (Addendum)
Medication Instructions:   START TAKING LASIX 20 MG ONCE A DAY     If you need a refill on your cardiac medications before your next appointment, please call your pharmacy.  Labwork: NONE ORDER TODAY    Testing/Procedures: NONE ORDER TODAY   Follow-Up:     Any Other Special Instructions Will Be Listed Below (If Applicable).

## 2015-05-25 NOTE — Progress Notes (Signed)
Cardiology Office Note Date:  05/26/2015   ID:  [name] [name], DOB 04-15-1934, MRN [id number]  PCP:  [name], MD  Cardiologist:  [name], MD    Chief Complaint  Patient presents with  . Shortness of Breath     History of Present Illness: [name] [name] is a 81 y.o. male who presents for evaluation of severe aortic stenosis.   The patient lives in [place] and is here today with his wife and daughter. He has been followed for many years for O2-dependent severe COPD and was recently noted to have a systolic murmur on exam demonstrating severe aortic stenosis.  The patient has longstanding dyspnea. This has progressed over the past year. He also complains of edema, chest pain at times (nonexertional), and dizziness. He has not had syncope. He has been sedentary for many years and is on home O2 24/7. He denies orthopnea or PND.    Past Medical History  Diagnosis Date  . OSA on CPAP   . Hyperglycemia   . COPD (chronic obstructive pulmonary disease) (HCC)     on home O2 4LPM  . Anxiety   . Hemorrhoids   . Diverticulosis   . Kidney stones   . PUD (peptic ulcer disease)   . Former smoker     quit 2001  . Right knee DJD 10/31/2011    Severe  . Severe aortic stenosis     a. Echo 11/16:  Mod LVH, EF 55-60%, no RWMA, Gr 1 DD, severe AS (mean 51 mmHg; peak 86 mmHg), MAC, mild LAE    Past Surgical History  Procedure Laterality Date  . Knee surgery    . Cholecystectomy    . Tonsillectomy    . Cardiac catheterization N/A 05/15/2015    Procedure: Right/Left Heart Cath and Coronary Angiography;  Surgeon: [name], MD;  Location: [facility];  Service: Cardiovascular;  Laterality: N/A;    Current Outpatient Prescriptions  Medication Sig Dispense Refill  . aspirin EC 81 MG tablet Take 1 tablet (81 mg total) by mouth daily.    Marland Kitchen guaiFENesin (MUCINEX) 600 MG 12 hr tablet Take 1,200 mg by mouth 2 (two) times daily.     Marland Kitchen LORazepam (ATIVAN) 1 MG  tablet TAKE 1/4 TO 1/2 TABLET BY MOUTH EVERY 6 HOURS AS NEEDED 30 tablet 2  . OXYGEN Inhale into the lungs. 4 Liters    . PROAIR HFA 108 (90 BASE) MCG/ACT inhaler INHALE 1-2 PUFFS INTO THE LUNGS EVERY 6 (SIX) HOURS AS NEEDED FOR WHEEZING. 8.5 each 3  . Respiratory Therapy Supplies (FLUTTER) DEVI Use as directed 1 each 0  . SYMBICORT 160-4.5 MCG/ACT inhaler INHALE 2 PUFFS BY MOUTH TWICE A DAY 10.2 Inhaler 5  . tiotropium (SPIRIVA HANDIHALER) 18 MCG inhalation capsule Place 1 capsule (18 mcg total) into inhaler and inhale daily. 30 capsule 6  . furosemide (LASIX) 20 MG tablet Take 1 tablet (20 mg total) by mouth daily. 30 tablet 6   No current facility-administered medications for this visit.    Allergies:   Iohexol; Atorvastatin; Celecoxib; Clinoril; Nitrofurantoin; and Sulfa antibiotics   Social History:  The patient  reports that he quit smoking about 15 years ago. His smoking use included Cigarettes. He has a 79.5 pack-year smoking history. He has quit using smokeless tobacco. His smokeless tobacco use included Snuff and Chew. He reports that he does not drink alcohol or use illicit drugs.   Family History:  The patient's  family history includes  Heart attack (age of onset: 10) in his mother; Prostate cancer (age of onset: 6) in his father; Stroke in his mother.  ROS:  Please see the history of present illness.  Otherwise, review of systems is positive for DOE, back pain, dizziness, easy bruising, chest pressure, leg pain, wheezing, constipation, anxiety, balance problems, headaches.  All other systems are reviewed and negative.   PHYSICAL EXAM: VS:  BP 144/76 mmHg  Pulse 119  Ht 5\' 10"  (1.778 m)  Wt 273 lb (123.832 kg)  BMI 39.17 kg/m2 , BMI Body mass index is 39.17 kg/(m^2). GEN: Pleasant elderly, obese male, in no acute distress HEENT: normal Neck: no JVD, no masses. Carotids delayed without bruits Cardiac: RRR with 3/6 harsh systolic murmur, late-peaking at the RUSB                 Respiratory:  diminished air movement bilaterally GI: soft, nontender, nondistended, + BS MS: no deformity or atrophy Ext: 1+ pretibial edema bilaterally, pedal pulses 2+= bilaterally Skin: warm and dry, no rash Neuro:  Strength and sensation are intact Psych: euthymic mood, full affect  EKG:  EKG is not ordered today.  Recent Labs: 04/11/2015: ALT 11; TSH 2.62 05/11/2015: BUN 10; Creatinine, Ser 0.71; Hemoglobin 12.9*; Platelets 208; Potassium 4.6; Sodium 141   Lipid Panel     Component Value Date/Time   CHOL 180 05/04/2014 0855   TRIG 89.0 05/04/2014 0855   HDL 47.00 05/04/2014 0855   CHOLHDL 4 05/04/2014 0855   VLDL 17.8 05/04/2014 0855   LDLCALC 115* 05/04/2014 0855      Wt Readings from Last 3 Encounters:  05/25/15 273 lb (123.832 kg)  05/23/15 270 lb (122.471 kg)  05/15/15 270 lb (122.471 kg)     Cardiac Studies Reviewed: 2D Echo: Study Conclusions  - Left ventricle: The cavity size was normal. Wall thickness was increased in a pattern of moderate LVH. Systolic function was normal. The estimated ejection fraction was in the range of 55% to 60%. Wall motion was normal; there were no regional wall motion abnormalities. Doppler parameters are consistent with abnormal left ventricular relaxation (grade 1 diastolic dysfunction). Doppler parameters are consistent with high ventricular filling pressure. - Aortic valve: There was severe stenosis. - Mitral valve: Severely calcified annulus. - Left atrium: The atrium was mildly dilated.  Impressions:  - Normal LV systolic function; grade 1 diastolic dysfunction; elevated LV filling pressures; moderate LVH; calcified aortic valve with severe AS (mean gradient 51 mmHg); severe MAC; mild LAE.  Transthoracic echocardiography. M-mode, complete 2D, spectral Doppler, and color Doppler. Birthdate: Patient birthdate: [birth year]. Age: Patient is 81 yr old. Sex: Gender: male. BMI: 39.9 kg/m^2.  Blood pressure:   122/80 Patient status: Outpatient. Study date: Study date: 04/18/2015. Study time: 01:25 PM. Location: Echo laboratory.  -------------------------------------------------------------------  ------------------------------------------------------------------- Left ventricle: The cavity size was normal. Wall thickness was increased in a pattern of moderate LVH. Systolic function was normal. The estimated ejection fraction was in the range of 55% to 60%. Wall motion was normal; there were no regional wall motion abnormalities. Doppler parameters are consistent with abnormal left ventricular relaxation (grade 1 diastolic dysfunction). Doppler parameters are consistent with high ventricular filling pressure.  ------------------------------------------------------------------- Aortic valve:  Trileaflet; moderately calcified leaflets. Doppler:  There was severe stenosis.  There was no regurgitation.  VTI ratio of LVOT to aortic valve: 0.33. Indexed valve area (VTI): 0.45 cm^2/m^2. Peak velocity ratio of LVOT to aortic valve: 0.22. Indexed valve area (Vmax): 0.31 cm^2/m^2. Mean velocity ratio  of LVOT to aortic valve: 0.28. Indexed valve area (Vmean): 0.38 cm^2/m^2.  Mean gradient (S): 51 mm Hg. Peak gradient (S): 86 mm Hg.  ------------------------------------------------------------------- Aorta: Aortic root: The aortic root was normal in size.  ------------------------------------------------------------------- Mitral valve:  Severely calcified annulus. Mobility was not restricted. Doppler: Transvalvular velocity was within the normal range. There was no evidence for stenosis. There was no regurgitation.  Valve area by pressure half-time: 3.55 cm^2. Indexed valve area by pressure half-time: 1.39 cm^2/m^2. Indexed valve area by continuity equation (using LVOT flow): 1.22 cm^2/m^2.  Mean gradient (D): 3 mm Hg. Peak gradient (D): 3 mm  Hg.  ------------------------------------------------------------------- Left atrium: The atrium was mildly dilated.  ------------------------------------------------------------------- Right ventricle: The cavity size was normal. Systolic function was normal.  ------------------------------------------------------------------- Pulmonic valve:  Doppler: Transvalvular velocity was within the normal range. There was no evidence for stenosis.  ------------------------------------------------------------------- Tricuspid valve:  Structurally normal valve.  Doppler: Transvalvular velocity was within the normal range. There was no regurgitation.  ------------------------------------------------------------------- Right atrium: The atrium was normal in size.  ------------------------------------------------------------------- Pericardium: There was no pericardial effusion.  ------------------------------------------------------------------- Systemic veins: Inferior vena cava: The vessel was normal in size. The respirophasic diameter changes were in the normal range (= 50%), consistent with normal central venous pressure. Diameter: 18.8 mm.  Cardiac Cath  Conclusion    1. Large, dominant RCA, possibly with a single coronary artery supplying an anomalous left circumflex and supplying distal circulation to the LAD territory 2. No evidence of obstructive coronary artery disease 3. 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 Severely calcified aortic valve with restricted mobility, hemodynamics demonstrating a mean transaortic gradient of 44 mmHg and calculated aortic valve area of 1 cm. 5. Mild pulmonary HTN and elevated right heart pressures, likely related to LV diastolic dysfunction  Recommendations: Gated cardiac CTA and CTA of the chest/abdomen/pelvis for TAVR evaluation. Cardiac surgical consultation for treatment options of severe symptomatic aortic stenosis.   CTA  Chest/Abd/Pelvis: VASCULAR MEASUREMENTS PERTINENT TO TAVR:  AORTA:  Minimal Aortic Diameter - 23 x 22 mm  Severity of Aortic Calcification - moderate to severe  RIGHT PELVIS:  Right Common Iliac Artery -  Minimal Diameter - 12.3 x 12.5 mm  Tortuosity - mild  Calcification - mild  Right External Iliac Artery -  Minimal Diameter - 9.2 x 9.0 mm  Tortuosity - moderate  Calcification - none  Right Common Femoral Artery -  Minimal Diameter - 8.1 x 6.5 mm  Tortuosity - mild  Calcification - mild  LEFT PELVIS:  Left Common Iliac Artery -  Minimal Diameter - 11.8 x 9.6 mm  Tortuosity - mild  Calcification - moderate  Left External Iliac Artery -  Minimal Diameter - 9.7 x 9.4 mm  Tortuosity - moderate  Calcification - none  Left Common Femoral Artery -  Minimal Diameter - 10.1 x 9.6 mm  Tortuosity - mild  Calcification - mild  Review of the MIP images confirms the above findings.  IMPRESSION: 1. Vascular findings and measurements pertinent to potential TAVR procedure, as detailed above. This patient does appear to have suitable pelvic arterial access bilaterally. 2. Mild fusiform aneurysmal dilatation of the infrarenal abdominal aorta which measures up to 3.8 x 3.6 cm. Recommend followup by ultrasound in 2 years. This recommendation follows ACR consensus guidelines: White Paper of the ACR Incidental Findings Committee II on Vascular Findings. J Am Coll Radiol 2013; 10:789-794. 3. Severe thickening calcification of the aortic valve, compatible with the patient's reported clinical history of aortic  stenosis. 4. Mild colonic diverticulosis without evidence of acute diverticulitis at this time. 5. Hepatic steatosis. 6. Additional incidental findings, as above.  Gated Cardiac CTA: FINDINGS: Aortic Valve: Severely thickened and calcified trileaflet aortic valve with severely restricted leaflet opening and mild  subvalvular calcifications. Aorta: Normal caliber. Mild diffuse calcifications. No dissection. Sinotubular Junction: 30 x 28 mm Ascending Thoracic Aorta: 37 x 36 mm Aortic Arch: 32 x 31 mm Descending Thoracic Aorta: 28 x 27 mm Sinus of Valsalva Measurements: Non-coronary: 31 mm Right -coronary: 31 mm Left -coronary: 30 mm Coronary Artery Height above Annulus: Left Main: There is no left main artery Right Coronary: 12 mm Virtual Basal Annulus Measurements: Maximum/Minimum Diameter: 28 x 25 mm Perimeter: 92 mm Area: 548 mm2 Coronary Arteries: There are anomalous coronary arteries. No left main or other artery is originating from the left sinus of Valsalva. RCA is a very large tortuous vessel that gives rise to a large PDA and a large PLA that also gives a branch supplying inferolateral wall. There is only mild calcified plaque with associated stenosis 0-25%. There is a small LAD originating from the proximal portion of RCA with interarterial course (between aorta and pulmonary artery and extending into mid to distal portion of the interventricular sulcus). There is another small branch originating from the proximal RCA (distally to LAD) with anterior course (anteriorly to the pulmonary artery) and extending into proximal portions of the AV groove (rudimentary LCX artery). Optimum Fluoroscopic Angle for Delivery: LAO 16 CRA 0. IMPRESSION: 1. Severely thickened and calcified trileaflet aortic valve with severely restricted leaflet opening and annular measurements suitable for delivery of 29 mm Edward-SAPIEN 3 valve. The area is borderline between 26 and 29 mm valve, however diameter measurements consistent with 29 mm valve. 2. 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 Only mild CAD is seen. 3. Optimum Fluoroscopic Angle for Delivery: LAO 16 CRA 0.  STS Risk  Calculator: Procedure: AV Replacement  Risk of Mortality: 3.101%  Morbidity or Mortality: 18.832%  Long Length of Stay: 9.893%  Short Length of Stay: 28.254%  Permanent Stroke: 0.944%  Prolonged Ventilation: 13.547%  DSW Infection: 0.995%   ASSESSMENT AND PLAN: Severe, Stage D Aortic Stenosis with NYHA Class III heart failure. I have reviewed the natural history of aortic stenosis with the patient and their family members who are present today. We have discussed the limitations of medical therapy and the poor prognosis associated with symptomatic aortic stenosis. We have reviewed potential treatment options, including palliative medical therapy, conventional surgical aortic valve replacement, and transcatheter aortic valve replacement. We discussed treatment options in the context of this patient's specific comorbid medical conditions. With symptoms of progressive dyspnea, chest pain, and dizziness over the past 6 months and severe aortic stenosis, aortic valve replacement is indicated to improve functional capacity, prevent worsening CHF, and improve mortality. Considering the patient's morbid obesity, severe COPD, reduced functional capacity, and obesity hypoventilation, I think TAVR is the preferred treatment as his comorbid medical conditions place him at high risk of conventional surgery. Following the decision to proceed with transcatheter aortic valve replacement, a discussion has been held regarding what types of management strategies would be attempted intraoperatively in the event of life-threatening complications, including whether or not the patient would be considered a candidate for the use of cardiopulmonary bypass and/or conversion to open sternotomy for attempted surgical intervention.  The patient has been advised of a variety  of complications that might develop including but not limited to risks of death, stroke, paravalvular leak, aortic dissection or other major vascular  complications, aortic annulus rupture, device embolization, cardiac rupture or perforation, mitral regurgitation, acute myocardial infarction, arrhythmia, heart block or bradycardia requiring permanent pacemaker placement, congestive heart failure, respiratory failure, renal failure, pneumonia, infection, other late complications related to structural valve deterioration or migration, or other complications that might ultimately cause a temporary or permanent loss of functional independence or other long term morbidity.  The patient provides full informed consent for the procedure as described and all questions were answered.  Note the patient has a radiocontrast allergy and will need premedication before TAVR.  Current medicines are reviewed with the patient today.  The patient does not have concerns regarding medicines.  Labs/ tests ordered today include:  No orders of the defined types were placed in this encounter.     Deatra James, MD  05/26/2015 6:12 AM    [facility] [place], [place], [place]  [address] Phone: [phone]; Fax: [phone]

## 2015-05-26 ENCOUNTER — Encounter: Payer: Self-pay | Admitting: Cardiovascular Disease

## 2015-05-27 ENCOUNTER — Encounter: Payer: Self-pay | Admitting: Surgery

## 2015-05-27 NOTE — Progress Notes (Signed)
Patient ID: [name] [name], male   DOB: 1933-11-21, 81 y.o.   MRN: [id number]   [facility] REPORT  Referring Provider is Tonia Ghent, MD PCP is [name], MD  Chief Complaint  Patient presents with  . Aortic Stenosis    Surgical eval for TAVR, cardiac cath 05/15/15    HPI:  The patient is a 81 year old gentleman with a prior smoking history, severe COPD on home oxygen, OSA on CPAP who was evaluated by Dr. [name] for a 6-12 month history of worsening shortness of breath and fatigue with exertion. He has also had some chest pressure with activity but denies any dizziness or syncope. He has had chronic lower extremity edema that he says occurred since he took Celebrex years ago. His wife and daughter are with him today and have also noticed a decline in his exertional tolerance over the past 6 months. Dr. [name] noted a systolic murmur and an echo showed severe AS with a mean gradient of 51 mm Hg. The LVEF was normal. 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 The AV mean gradient was 44 mm Hg with a calculated AVA of 1 cm2.   He lives at home with his wife and is fairly sedentary and on oxygen 24/7. He moves around his house well and can care for himself. He uses a Corporate investment banker when out of the house.     Past Medical History  Diagnosis Date  . OSA on CPAP   . Hyperglycemia   . COPD (chronic obstructive pulmonary disease) (HCC)     on home O2 4LPM  . Anxiety   . Hemorrhoids   . Diverticulosis   . Kidney stones   . PUD (peptic ulcer disease)   . Former smoker     quit 2001  . Right knee DJD 10/31/2011    Severe  . Severe aortic stenosis     a. Echo 11/16:  Mod LVH, EF 55-60%, no RWMA,  Gr 1 DD, severe AS (mean 51 mmHg; peak 86 mmHg), MAC, mild LAE    Past Surgical History  Procedure Laterality Date  . Knee surgery    . Cholecystectomy    . Tonsillectomy    . Cardiac catheterization N/A 05/15/2015    Procedure: Right/Left Heart Cath and Coronary Angiography;  Surgeon: [name], MD;  Location: [facility];  Service: Cardiovascular;  Laterality: N/A;    Family History  Problem Relation Age of Onset  . Stroke Mother   . Heart attack Mother 70    deceased  . Prostate cancer Father 25    Social History   Social History  . Marital Status: Married    Spouse Name: N/A  . Number of Children: 1  . Years of Education: N/A   Occupational History  . 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 Smoking status: Former Smoker -- 1.50 packs/day for 53 years    Types: Cigarettes    Quit date: 06/23/1999  . Smokeless tobacco: Former Systems developer    Types: Snuff, Chew  . Alcohol Use: No  . Drug Use: No  . Sexual Activity: Not  on file   Other Topics Concern  . Not on file   Social History Narrative   Retired, print shop   Married 18+ years   1 daughter   Former smoker    Current Outpatient Prescriptions  Medication Sig Dispense Refill  . aspirin EC 81 MG tablet Take 1 tablet (81 mg total) by mouth daily.    Marland Kitchen guaiFENesin (MUCINEX) 600 MG 12 hr tablet Take 1,200 mg by mouth 2 (two) times daily.     Marland Kitchen LORazepam (ATIVAN) 1 MG tablet TAKE 1/4 TO 1/2 TABLET BY MOUTH EVERY 6 HOURS AS NEEDED 30 tablet 2  . OXYGEN Inhale into the lungs. 4 Liters    . PROAIR HFA 108 (90 BASE) MCG/ACT inhaler INHALE 1-2 PUFFS INTO THE LUNGS EVERY 6 (SIX) HOURS AS NEEDED FOR WHEEZING. 8.5 each 3  . Respiratory Therapy Supplies (FLUTTER) DEVI Use as directed 1 each 0  . SYMBICORT 160-4.5 MCG/ACT inhaler INHALE 2 PUFFS BY MOUTH TWICE A DAY 10.2 Inhaler 5  . tiotropium (SPIRIVA HANDIHALER) 18 MCG inhalation capsule Place 1 capsule  (18 mcg total) into inhaler and inhale daily. 30 capsule 6  . furosemide (LASIX) 20 MG tablet Take 1 tablet (20 mg total) by mouth daily. 30 tablet 6   No current facility-administered medications for this visit.    Allergies  Allergen Reactions  . Iohexol Hives and Swelling    Pt reports swelling, redness, hives, and blisters   . Atorvastatin     REACTION: aches  . Celecoxib     REACTION: rash  . Clinoril [Sulindac]     REACTION: rash  . Nitrofurantoin   . Sulfa Antibiotics     Intolerant but unrecalled.       Review of Systems:   General:  normal appetite, decreased energy, no weight gain, some  weight loss, no fever  Cardiac:  mild chest pain with exertion, no chest pain at rest, moderate SOB with mild exertion, has resting SOB, no PND, no orthopnea, no palpitations, no arrhythmia, no atrial fibrillation, chronic LE edema, no dizzy spells, no syncope  Respiratory:  chronic shortness of breath, uses home oxygen, no productive cough, no dry cough, no bronchitis, no wheezing, no hemoptysis, no asthma, no pain with inspiration or cough, has sleep apnea, uses CPAP at night  GI:   no difficulty swallowing, no reflux, no frequent heartburn, no hiatal hernia, no abdominal pain, has constipation, no diarrhea, no hematochezia, no hematemesis, no melena  GU:   no dysuria,  no frequency, no urinary tract infection, no hematuria, no enlarged prostate, no kidney stones, no kidney disease  Vascular:  no pain suggestive of claudication, has pain in feet, has leg cramps, no varicose veins, no DVT, no non-healing foot ulcer  Neuro:   no stroke, no TIA's, no seizures, has headaches, notemporary blindness one eye,  no slurred speech, has peripheral neuropathy, no chronic pain, has instability of gait, no memory/cognitive dysfunction  Musculoskeletal: no arthritis, no joint swelling, no myalgias, has difficulty walking, decreased mobility   Skin:   no rash, no itching, no skin infections, no pressure  sores or ulcerations, chronic redness of both lower legs  Psych:   no anxiety, no depression, no nervousness, no unusual recent stress  Eyes:   no blurry vision, no floaters, no recent vision changes,  wears glasses or contacts  ENT:   some hearing loss, no loose or painful teeth, wears dentures, last saw dentist this year  Hematologic:  has easy bruising,  no abnormal bleeding, no clotting disorder, no frequent epistaxis  Endocrine:  no diabetes, does not check CBG's at home      Physical Exam:   BP 126/88 mmHg  Pulse 96  Resp 20  Ht 5\' 10"  (1.778 m)  Wt 270 lb (122.471 kg)  BMI 38.74 kg/m2  SpO2 95%  General:  Elderly,   chronically ill-appearing  HEENT:  Unremarkable , NCAT, PERLA, EOMI  Neck:   no JVD, no bruits, no adenopathy or thyromegaly  Chest:   clear to auscultation, symmetrical breath sounds, no wheezes, no rhonchi   CV:   RRR, grade III/VI crescendo/decrescendo murmur heard best at RSB,  no diastolic murmur  Abdomen:  soft, non-tender, no masses or organomegaly  Extremities:  warm, well-perfused, pulses no palpable, moderate LE edema with purplish discoloration and thickening of the skin in the lower legs.   Rectal/GU  Deferred  Neuro:   Grossly non-focal and symmetrical throughout  Skin:   Clean and dry, no rashes, no breakdown   Diagnostic Tests:  [facility]            [address], [place] [address]              [phone]  ------------------------------------------------------------------- Transthoracic Echocardiography  (Report amended )  Patient:  [name] [name], [name] [name] MR #:    [id number] Study Date: 04/18/2015 Gender:   M Age:    6 Height:   177.8 cm Weight:   126.1 kg BSA:    2.55 m^2 Pt. Status: Room:  [place], [place], [place], Outpatient SONOGRAPHER [facility],  RD[facility]  cc:  ------------------------------------------------------------------- LV EF: 55% -  60%  ------------------------------------------------------------------- Indications:   Dyspnea (R06.00).  ------------------------------------------------------------------- History:  PMH:  Chronic obstructive pulmonary disease. Risk factors: Chronic Respiratory Failure (on 4 Liters home O2), Obstructive Sleep Apnea with CPAP. Family history of coronary artery disease. Former tobacco use.  ------------------------------------------------------------------- Study Conclusions  - Left ventricle: The cavity size was normal. Wall thickness was increased in a pattern of moderate LVH. Systolic function was normal. The estimated ejection fraction was in the range of 55% to 60%. Wall motion was normal; there were no regional wall motion abnormalities. Doppler parameters are consistent with abnormal left ventricular relaxation (grade 1 diastolic dysfunction). Doppler parameters are consistent with high ventricular filling pressure. - Aortic valve: There was severe stenosis. - Mitral valve: Severely calcified annulus. - Left atrium: The atrium was mildly dilated.  Impressions:  - Normal LV systolic function; grade 1 diastolic dysfunction; elevated LV filling pressures; moderate LVH; calcified aortic valve with severe AS (mean gradient 51 mmHg); severe MAC; mild LAE.  Transthoracic echocardiography. M-mode, complete 2D, spectral Doppler, and color Doppler. Birthdate: Patient birthdate: February 29, 1934. Age: Patient is 81 yr old. Sex: Gender: male. BMI: 39.9 kg/m^2. Blood pressure:   122/80 Patient status: Outpatient. Study date: Study date: 04/18/2015. Study time: 01:25 PM. Location: Echo laboratory.  -------------------------------------------------------------------  ------------------------------------------------------------------- Left ventricle:  The cavity size was normal. Wall thickness was increased in a pattern of moderate LVH. Systolic function was normal. The estimated ejection fraction was in the range of 55% to 60%. Wall motion was normal; there were no regional wall motion abnormalities. Doppler parameters are consistent with abnormal left ventricular relaxation (grade 1 diastolic dysfunction). Doppler parameters are consistent with high ventricular filling pressure.  ------------------------------------------------------------------- Aortic valve:  Trileaflet; moderately calcified leaflets. Doppler:  There was severe stenosis.  There was no regurgitation.  VTI ratio of LVOT to aortic valve: 0.33. Indexed valve area (VTI): 0.45 cm^2/m^2. Peak velocity ratio of LVOT to aortic valve: 0.22. Indexed valve area (Vmax): 0.31 cm^2/m^2. Mean velocity ratio of LVOT to aortic valve: 0.28. Indexed valve area (Vmean): 0.38 cm^2/m^2.  Mean gradient (S): 51 mm Hg. Peak gradient (S): 86 mm Hg.  ------------------------------------------------------------------- Aorta: Aortic root: The aortic root was normal in size.  ------------------------------------------------------------------- Mitral valve:  Severely calcified annulus. Mobility was not restricted. Doppler: Transvalvular velocity was within the normal range. There was no evidence for stenosis. There was no regurgitation.  Valve area by pressure half-time: 3.55 cm^2. Indexed valve area by pressure half-time: 1.39 cm^2/m^2. Indexed valve area by continuity equation (using LVOT flow): 1.22 cm^2/m^2.  Mean gradient (D): 3 mm Hg. Peak gradient (D): 3 mm Hg.  ------------------------------------------------------------------- Left atrium: The atrium was mildly dilated.  ------------------------------------------------------------------- Right ventricle: The cavity size was normal. Systolic function  was normal.  ------------------------------------------------------------------- Pulmonic valve:  Doppler: Transvalvular velocity was within the normal range. There was no evidence for stenosis.  ------------------------------------------------------------------- Tricuspid valve:  Structurally normal valve.  Doppler: Transvalvular velocity was within the normal range. There was no regurgitation.  ------------------------------------------------------------------- Right atrium: The atrium was normal in size.  ------------------------------------------------------------------- Pericardium: There was no pericardial effusion.  ------------------------------------------------------------------- Systemic veins: Inferior vena cava: The vessel was normal in size. The respirophasic diameter changes were in the normal range (= 50%), consistent with normal central venous pressure. Diameter: 18.8 mm.  ------------------------------------------------------------------- Measurements  IVC                    Value     Reference ID                    18.8 mm    ---------  Left ventricle              Value     Reference LV ID, ED, PLAX chordal          44.3 mm    43 - 52 LV ID, ES, PLAX chordal          28.3 mm    23 - 38 LV fx shortening, PLAX chordal      36  %    >=29 LV PW thickness, ED            16  mm    --------- IVS/LV PW ratio, ED            0.86      <=1.3 Stroke volume, 2D             95  ml    --------- Stroke volume/bsa, 2D           37  ml/m^2  --------- LV ejection fraction, 1-p A4C       57  %    --------- LV end-diastolic volume, 2-p       131  ml    --------- LV end-systolic volume, 2-p        61  ml    --------- LV ejection fraction, 2-p          53  %    --------- Stroke volume, 2-p            70  ml    --------- LV end-diastolic volume/bsa, 2-p     51  ml/m^2  --------- LV  end-systolic volume/bsa, 2-p      24  ml/m^2  --------- Stroke volume/bsa, 2-p          27.4 ml/m^2  --------- LV e&', lateral              6.58 cm/s   --------- LV E/e&', lateral             13.88     --------- LV e&', medial               4.5  cm/s   --------- LV E/e&', medial              20.29     --------- LV e&', average              5.54 cm/s   --------- LV E/e&', average             16.48     ---------  Ventricular septum            Value     Reference IVS thickness, ED             13.8 mm    ---------  LVOT                   Value     Reference LVOT ID, S                21  mm    --------- LVOT area                 3.46 cm^2   --------- LVOT peak velocity, S           104  cm/s   --------- LVOT mean velocity, S           76.2 cm/s   --------- LVOT VTI, S                27.6 cm    --------- LVOT peak gradient, S           4   mm Hg  ---------  Aortic valve               Value     Reference Aortic valve peak velocity, S       464  cm/s   --------- Aortic valve mean velocity, S       270  cm/s   --------- Aortic valve VTI, S            84  cm    --------- Aortic mean gradient, S          51  mm Hg  --------- Aortic peak gradient, S          86  mm Hg  --------- VTI ratio, LVOT/AV            0.33      --------- Aortic valve area/bsa, VTI        0.45 cm^2/m^2 --------- Velocity ratio, peak, LVOT/AV        0.22      --------- Aortic valve area/bsa, peak        0.31 cm^2/m^2 --------- velocity Velocity ratio, mean, LVOT/AV       0.28      --------- Aortic valve area/bsa, mean        0.38 cm^2/m^2 --------- velocity  Aorta                   Value     Reference  Aortic root ID, ED            34  mm    ---------  Left atrium                Value     Reference LA ID, A-P, ES              45  mm    --------- LA ID/bsa, A-P              1.76 cm/m^2  <=2.2 LA volume, S               69  ml    --------- LA volume/bsa, S             27.1 ml/m^2  --------- LA volume, ES, 1-p A4C          58  ml    --------- LA volume/bsa, ES, 1-p A4C        22.7 ml/m^2  --------- LA volume, ES, 1-p A2C          78  ml    --------- LA volume/bsa, ES, 1-p A2C        30.6 ml/m^2  ---------  Mitral valve               Value     Reference Mitral E-wave peak velocity        91.3 cm/s   --------- Mitral A-wave peak velocity        143  cm/s   --------- Mitral mean velocity, D          75.4 cm/s   --------- Mitral deceleration time     (H)   293  ms    150 - 230 Mitral pressure half-time         62  ms    --------- Mitral mean gradient, D          3   mm Hg  --------- Mitral peak gradient, D          3   mm Hg  --------- Mitral E/A ratio, peak          0.64      --------- Mitral valve area, PHT, DP        3.55 cm^2   --------- Mitral valve area/bsa, PHT, DP      1.39 cm^2/m^2 --------- Mitral valve area/bsa, LVOT        1.22 cm^2/m^2 --------- continuity Mitral annulus VTI, D           30.8 cm    ---------  Right ventricle               Value     Reference RV s&', lateral, S             18  cm/s   ---------  Legend: (L) and (H) mark values outside specified reference range.  ------------------------------------------------------------------- [place] Muta Crenshaw 2016-11-02T15:58:53  [name], MD (Primary)      Procedures    Right/Left Heart Cath and Coronary Angiography    Conclusion    1. Large, dominant RCA, possibly with a single coronary artery supplying an anomalous left circumflex and supplying distal circulation to the LAD territory 2. No evidence of obstructive coronary artery disease 3. 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 Severely calcified aortic valve with restricted mobility, hemodynamics demonstrating a mean transaortic gradient of 44 mmHg and calculated aortic valve area of 1 cm. 5. Mild pulmonary HTN and  elevated right heart pressures, likely related to LV diastolic dysfunction  Recommendations: Gated cardiac CTA and CTA of the chest/abdomen/pelvis for TAVR evaluation. Cardiac surgical consultation for treatment options of severe symptomatic aortic stenosis.    Indications    Severe aortic stenosis [I35.0 (ICD-10-CM)]    Technique and Indications    INDICATION: Severe aortic stenosis  PROCEDURAL DETAILS: There was an indwelling IV in a left antecubital vein. Using normal sterile technique, the IV was changed out for a 5 Fr brachial sheath over a 0.018 inch wire. The left wrist was then prepped, draped, and anesthetized with 1% lidocaine. Using the modified Seldinger technique a 5/6 French Slender sheath was placed in the left radial artery. Intra-arterial verapamil was administered through the radial artery sheath. IV heparin was administered after a JR4 catheter was advanced into the central aorta. A Swan-Ganz catheter was used for the right heart catheterization. Standard protocol was followed for recording of right heart  pressures and sampling of oxygen saturations. Fick cardiac output was calculated. Standard Judkins catheters were used for selective coronary angiography and left ventriculography. There were no immediate procedural complications. The patient was transferred to the post catheterization recovery area for further monitoring.    Estimated blood loss <50 mL. There were no immediate complications during the procedure.    Coronary Findings    Dominance: Right   Left Anterior Descending  The LAD is not visualized from the aortic root. There is no left main visualized. It appears the LAD territory fills from the distal branch vessels of the RCA.     Left Circumflex  The left circumflex has an anomalous origin, arising from the right cusp. There is no obstructive disease present.     Right Coronary Artery  The right coronary artery is a large, dominant vessel. The distal PDA and PLA branches appear to supply the LAD territory.       Right Heart Pressures Hemodynamic findings consistent with mild pulmonary hypertension. Elevated LV EDP consistent with volume overload.    Left Heart    Aortic Valve There is severe aortic valve stenosis, and trivial (1+) aortic regurgitation. The aortic valve is calcified. There is restricted aortic valve motion.    Coronary Diagrams    Diagnostic Diagram            Implants    Name ID Temporary Type Supply   No information to display    PACS Images    Show images for Cardiac catheterization     Link to Procedure Log    Procedure Log      Hemo Data       Most Recent Value   Fick Cardiac Output  7.49 L/min   Fick Cardiac Output Index  3.16 (L/min)/BSA   Aortic Mean Gradient  44.1 mmHg   Aortic Peak Gradient  39 mmHg   Aortic Valve Area  1.02   Aortic Value Area Index  0.43 cm2/BSA   RA A Wave  18 mmHg   RA V Wave  17 mmHg   RA Mean  15 mmHg   RV Systolic Pressure  49 mmHg   RV Diastolic Pressure  15 mmHg    RV EDP  18 mmHg   PA Systolic Pressure  49 mmHg   PA Diastolic Pressure  22 mmHg   PA Mean  34 mmHg   PW A Wave  29 mmHg   PW V Wave  25 mmHg   PW Mean  24 mmHg   AO Systolic  Pressure  136 mmHg   AO Diastolic Pressure  71 mmHg   AO Mean  98 mmHg   LV Systolic Pressure  [id number] mmHg   LV Diastolic Pressure  22 mmHg   LV EDP  28 mmHg   Arterial Occlusion Pressure Extended Systolic Pressure  123XX123 mmHg   Arterial Occlusion Pressure Extended Diastolic Pressure  77 mmHg   Arterial Occlusion Pressure Extended Mean Pressure  112 mmHg   QP/QS  1   TPVR Index  10.76 HRUI   TSVR Index  31.02 HRUI   PVR SVR Ratio  0.12   TPVR/TSVR Ratio  0.35   ADDENDUM REPORT: 05/23/2015 08:53 CLINICAL DATA: Aortic stenosis EXAM: Cardiac TAVR CT TECHNIQUE: The patient was scanned on a Philips 256 scanner. A 120 kV retrospective scan was triggered in the descending thoracic aorta at 111 HU's. Gantry rotation speed was 270 msecs and collimation was .9 mm. 15 mg of iv metoprolol and 0.4 sl NTG were given. The 3D data set was reconstructed in 5% intervals of the R-R cycle. Systolic and diastolic phases were analyzed on a dedicated work station using MPR, MIP and VRT modes. The patient received 80 cc of contrast. FINDINGS: Aortic Valve: Severely thickened and calcified trileaflet aortic valve with severely restricted leaflet opening and mild subvalvular calcifications. Aorta: Normal caliber. Mild diffuse calcifications. No dissection. Sinotubular Junction: 30 x 28 mm Ascending Thoracic Aorta: 37 x 36 mm Aortic Arch: 32 x 31 mm Descending Thoracic Aorta: 28 x 27 mm Sinus of Valsalva Measurements: Non-coronary: 31 mm Right -coronary: 31 mm Left -coronary: 30 mm Coronary Artery Height above Annulus: Left Main: There is no left main artery Right Coronary: 12 mm Virtual Basal Annulus Measurements: Maximum/Minimum Diameter: 28 x 25 mm Perimeter: 92 mm Area: 548 mm2 Coronary  Arteries: There are anomalous coronary arteries. No left main or other artery is originating from the left sinus of Valsalva. RCA is a very large tortuous vessel that gives rise to a large PDA and a large PLA that also gives a branch supplying inferolateral wall. There is only mild calcified plaque with associated stenosis 0-25%. There is a small LAD originating from the proximal portion of RCA with interarterial course (between aorta and pulmonary artery and extending into mid to distal portion of the interventricular sulcus). There is another small branch originating from the proximal RCA (distally to LAD) with anterior course (anteriorly to the pulmonary artery) and extending into proximal portions of the AV groove (rudimentary LCX artery). Optimum Fluoroscopic Angle for Delivery: LAO 16 CRA 0. IMPRESSION: 1. Severely thickened and calcified trileaflet aortic valve with severely restricted leaflet opening and annular measurements suitable for delivery of 29 mm Edward-SAPIEN 3 valve. The area is borderline between 26 and 29 mm valve, however diameter measurements consistent with 29 mm valve. 2. 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 Only mild CAD is seen. 3. Optimum Fluoroscopic Angle for Delivery: LAO 65 CRA 0. Ena Dawley Electronically Signed  By: Ena Dawley  On: 05/23/2015 08:53   CLINICAL DATA: 81 year old male with history of severe aortic stenosis. Preprocedural study prior to potential transcatheter aortic valve replacement (TAVR) procedure.  EXAM: CT ANGIOGRAPHY CHEST, ABDOMEN AND PELVIS  TECHNIQUE: Multidetector CT imaging through the chest, abdomen and pelvis was performed using the standard protocol during bolus administration of intravenous contrast. Multiplanar reconstructed images and MIPs were obtained and reviewed to evaluate the vascular  anatomy.  CONTRAST: 47mL OMNIPAQUE IOHEXOL 350 MG/ML SOLN  COMPARISON: Chest CT 04/18/2015.  FINDINGS: CTA CHEST FINDINGS  Mediastinum/Lymph Nodes: Heart size is borderline enlarged. Concentric left ventricular hypertrophy. There is no significant pericardial fluid, thickening or pericardial calcification. There is atherosclerosis of the thoracic aorta, the great vessels of the mediastinum and the coronary arteries, including calcified atherosclerotic plaque in the right coronary artery. No pathologically enlarged mediastinal or hilar lymph nodes. 1.9 x 2.1 cm lesion in the anterior mediastinum immediately anterior to the left innominate vein only slightly larger than remote prior examination from 03/03/2006, likely to represent a benign cystic lesion, potentially of thymic origin. Esophagus is unremarkable in appearance. No axillary lymphadenopathy.  Lungs/Pleura: 4 mm right upper lobe nodule (image 18 of series 407). Linear scarring in the left lower lobe is unchanged. Small cluster of peribronchovascular micronodules noted in the right middle lobe on the prior study have resolved, presumably areas of benign mucoid impaction on the prior examination. No acute consolidative airspace disease. No pleural effusions.  Musculoskeletal/Soft Tissues: There are no aggressive appearing lytic or blastic lesions noted in the visualized portions of the skeleton.  CTA ABDOMEN AND PELVIS FINDINGS  Hepatobiliary: Diffuse low attenuation throughout the hepatic parenchyma, compatible with hepatic steatosis. No cystic or solid hepatic lesions. No intra or extrahepatic biliary ductal dilatation. Status post cholecystectomy.  Pancreas: No pancreatic mass. No pancreatic ductal dilatation. No pancreatic or peripancreatic fluid or inflammatory changes.  Spleen: Unremarkable.  Adrenals/Urinary Tract: Bilateral adrenal glands are normal in appearance. Multiple low-attenuation  lesions in the kidneys bilaterally, the largest of which are compatible with simple cysts, including the largest lesion which is exophytic measuring 6.5 cm in the upper pole of the right kidney. Several other smaller sub cm low-attenuation lesions are noted in the kidneys bilaterally, too small to definitively characterize, but also favored to represent tiny cysts. No hydroureteronephrosis. Urinary bladder is normal in appearance.  Stomach/Bowel: Normal appearance of the stomach. Numerous colonic diverticulae are noted, particularly in the sigmoid colon, without surrounding inflammatory changes to suggest an acute diverticulitis at this time. Normal appendix.  Vascular/Lymphatic: Extensive atherosclerosis throughout the abdominal and pelvic vasculature, with vascular findings and measurements pertinent to potential TAVR procedure, as detailed below. In addition, there is fusiform aneurysmal dilatation of the infrarenal abdominal aorta which measures up to 3.8 x 3.6 cm immediately beneath the inferior mesenteric artery (IMA) origin. Single renal arteries are noted bilaterally. Celiac axis, superior mesenteric artery and inferior mesenteric artery are all widely patent. No lymphadenopathy noted in the abdomen or pelvis.  Reproductive: Prostate gland and seminal vesicles are unremarkable in appearance.  Other: No significant volume of ascites. No pneumoperitoneum.  Musculoskeletal: There are no aggressive appearing lytic or blastic lesions noted in the visualized portions of the skeleton.  VASCULAR MEASUREMENTS PERTINENT TO TAVR:  AORTA:  Minimal Aortic Diameter - 23 x 22 mm  Severity of Aortic Calcification - moderate to severe  RIGHT PELVIS:  Right Common Iliac Artery -  Minimal Diameter - 12.3 x 12.5 mm  Tortuosity - mild  Calcification - mild  Right External Iliac Artery -  Minimal Diameter - 9.2 x 9.0 mm  Tortuosity -  moderate  Calcification - none  Right Common Femoral Artery -  Minimal Diameter - 8.1 x 6.5 mm  Tortuosity - mild  Calcification - mild  LEFT PELVIS:  Left Common Iliac Artery -  Minimal Diameter - 11.8 x 9.6 mm  Tortuosity - mild  Calcification - moderate  Left External Iliac  Artery -  Minimal Diameter - 9.7 x 9.4 mm  Tortuosity - moderate  Calcification - none  Left Common Femoral Artery -  Minimal Diameter - 10.1 x 9.6 mm  Tortuosity - mild  Calcification - mild  Review of the MIP images confirms the above findings.  IMPRESSION: 1. Vascular findings and measurements pertinent to potential TAVR procedure, as detailed above. This patient does appear to have suitable pelvic arterial access bilaterally. 2. Mild fusiform aneurysmal dilatation of the infrarenal abdominal aorta which measures up to 3.8 x 3.6 cm. Recommend followup by ultrasound in 2 years. This recommendation follows ACR consensus guidelines: White Paper of the ACR Incidental Findings Committee II on Vascular Findings. J Am Coll Radiol 2013; 10:789-794. 3. Severe thickening calcification of the aortic valve, compatible with the patient's reported clinical history of aortic stenosis. 4. Mild colonic diverticulosis without evidence of acute diverticulitis at this time. 5. Hepatic steatosis. 6. Additional incidental findings, as above.   Electronically Signed  By: Vinnie Langton M.D.  On: 05/22/2015 13:46  STS Risk Calculator: Procedure: AV Replacement  Risk of Mortality: 3.101%  Morbidity or Mortality: 18.832%  Long Length of Stay: 9.893%  Short Length of Stay: 28.254%  Permanent Stroke: 0.944%  Prolonged Ventilation: 13.547%  DSW Infection: 0.995%    Impression:  He has stage D severe aortic stenosis with NYHA class III symptoms of exertional shortness of breath and fatigue with minimal activity. He has severe oxygen-dependent COPD with an FEV1 of  0.77 ( 24% pred) but his symptoms have been progressing over the last 6-12 months and are likely worsening due to his aortic stenosis. I discussed the progressive nature of aortic stenosis with the patient and his family and the limitations of medical therapy and the poor prognosis associated with symptomatic aortic stenosis. I have reviewed potential treatment options, including palliative medical therapy, conventional surgical aortic valve replacement, and transcatheter aortic valve replacement. I don't think he is a candidate for open surgical aortic valve replacement due to the severity of his COPD. I think his surgical risk is underestimated by the STS risk estimator. I think TAVR is the best option for him. His cardiac CT shows favorable anatomy for insertion of a 29 mm Sapien 3 valve. His abdominal and pelvic CT show suitable pelvic arterial access bilaterally for a femoral approach. He does have a small infra-renal AAA measuring 3.8 x 3.6 cm.   He has not seen the other members of the team but assuming we decide to proceed with transcatheter aortic valve replacement, a discussion has been held regarding what types of management strategies would be attempted intraoperatively in the event of life-threatening complications, including whether or not the patient would be considered a candidate for the use of cardiopulmonary bypass and/or conversion to open sternotomy for attempted surgical intervention. The patient has been advised of a variety of complications that might develop including but not limited to risks of death, stroke, paravalvular leak, aortic dissection or other major vascular complications, aortic annulus rupture, device embolization, cardiac rupture or perforation, mitral regurgitation, acute myocardial infarction, arrhythmia, heart block or bradycardia requiring permanent pacemaker placement, congestive heart failure, respiratory failure, renal failure, pneumonia, infection, other late  complications related to structural valve deterioration or migration, or other complications that might ultimately cause a temporary or permanent loss of functional independence or other long term morbidity.  The patient provides full informed consent for the procedure as described and all questions were answered.   Plan:  He will see Dr.  Cooper on Friday this week and will be scheduled for a second surgical opinion with Dr. Roxy Manns. He will then be scheduled for transfemoral TAVR.    Gaye Pollack, MD 05/23/2015

## 2015-06-03 ENCOUNTER — Other Ambulatory Visit: Payer: Self-pay | Admitting: Family Medicine

## 2015-06-03 DIAGNOSIS — I10 Essential (primary) hypertension: Secondary | ICD-10-CM

## 2015-06-05 ENCOUNTER — Encounter: Payer: PPO | Admitting: Thoracic Surgery (Cardiothoracic Vascular Surgery)

## 2015-06-06 ENCOUNTER — Other Ambulatory Visit: Payer: Self-pay | Admitting: *Deleted

## 2015-06-08 ENCOUNTER — Other Ambulatory Visit: Payer: PPO

## 2015-06-08 ENCOUNTER — Telehealth: Payer: Self-pay

## 2015-06-08 NOTE — Telephone Encounter (Signed)
RECEIVED CALL FROM COURTNEY FROM [facility] WANTING TO KNOW WHAT DAY DR COOPER WOULD BE IN OFFICE.  I VERIFIED WITH MICHELLE S ,RN  THURS Jun 14, 2015 TO HAVE PEER TO PEER FOR PATIENT UPCOMING PROCEDURE. WILL SEND Theodosia Quay ,RN A STAFF MESSAGE ANY QUESTION CALL COURTNEY([facility]) [phone] DAJ

## 2015-06-19 ENCOUNTER — Encounter: Payer: PPO | Admitting: Family Medicine

## 2015-06-19 ENCOUNTER — Encounter: Payer: PPO | Admitting: Thoracic Surgery (Cardiothoracic Vascular Surgery)

## 2015-06-21 ENCOUNTER — Institutional Professional Consult (permissible substitution) (INDEPENDENT_AMBULATORY_CARE_PROVIDER_SITE_OTHER): Payer: PPO | Admitting: Thoracic Surgery (Cardiothoracic Vascular Surgery)

## 2015-06-21 ENCOUNTER — Encounter: Payer: Self-pay | Admitting: Thoracic Surgery (Cardiothoracic Vascular Surgery)

## 2015-06-21 VITALS — BP 139/80 | HR 87 | Resp 18 | Ht 70.0 in | Wt 268.0 lb

## 2015-06-21 DIAGNOSIS — I350 Nonrheumatic aortic (valve) stenosis: Secondary | ICD-10-CM

## 2015-06-21 NOTE — Progress Notes (Signed)
HEART AND [name] CONSULTATION REPORT  Referring Provider is [name] Mocha, MD PCP is [name] Stain, MD  Chief Complaint  Patient presents with  . Aortic Stenosis    2nd TAVR eval, discuss completed studies    HPI:  Patient is a 82 year old obese male with history severe COPD on home oxygen therapy, obstructive sleep apnea on CPAP, and severely limited physical mobility who has been recently found to have severe symptomatic aortic stenosis and has been referred for a second surgical consultation to discuss treatment options further. The patient states that he has known he has had a heart murmur for several years. He was followed for years by Dr. Gwenette [name] because of severe COPD and obstructive sleep apnea. More recently the patient has been followed by Dr. Lenna [name].  He has experienced significant progression of symptoms of exertional shortness of breath and was noted to have a prominent systolic murmur on physical exam. Echocardiogram confirmed the presence of severe aortic stenosis with peak velocity across the aortic valve measured 4.6 m/s corresponding to mean transvalvular gradient estimated 51 mmHg. Left ventricular systolic function remains preserved with ejection fraction estimated 55-60%.  The patient was referred for cardiology consultation and underwent left and right heart catheterization by Dr. Burt Knack 05/15/2015.  Catheterization confirmed the presence of severe aortic stenosis with mean transvalvular gradient measured 44 mmHg at catheterization. There was mild pulmonary hypertension. 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  Cardiac gated CT angiogram of the heart confirmed similar coronary anatomy as well as the presence of severe aortic stenosis with anatomical characteristics suitable for transcatheter  aortic valve replacement without any significant complicating features. CT angiogram of the abdomen and pelvis revealed the presence of a small infrarenal abdominal aortic aneurysm but adequate pelvic vascular access to facilitate a transfemoral approach. The patient was seen in consultation by Dr. [name] and felt to be a relatively poor candidate for conventional surgical aortic valve replacement. Transcatheter aortic valve replacement has been recommended, and the patient was referred for a second surgical consultation.  The patient is married and lives locally in [place] with his wife. 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 He has a long-standing history of exertional shortness of breath that has previously been attributed to severe COPD. He has a remote history of heavy tobacco abuse although he quit smoking in 2001. He has been oxygen dependent for several years. He uses CPAP at night.  He has developed progressive exertional shortness of breath over the past year and now gets short of breath with very mild physical activity and occasionally at rest. He has had some chest tightness with exertion, but this typically only occurs when he is doing something fairly strenuous. He has had occasional PND but denies orthopnea or syncope. He has occasional mild dizzy spells and some mild chronic lower extremity edema. The patient was a very sedentary lifestyle and has severely limited physical mobility. He has had problems with pain and weakness in both lower legs for many years. He typically uses a walker or cane for stability. He has been obese for the majority of his adult life.  Past Medical History  Diagnosis Date  . OSA on CPAP   . Hyperglycemia   . COPD (chronic obstructive pulmonary disease) (HCC)     on home O2 4LPM  . Anxiety   .  Hemorrhoids   . Diverticulosis   . Kidney stones   . PUD (peptic ulcer disease)   . Former smoker     quit 2001  . Right knee  DJD 10/31/2011    Severe  . Severe aortic stenosis     a. Echo 11/16:  Mod LVH, EF 55-60%, no RWMA, Gr 1 DD, severe AS (mean 51 mmHg; peak 86 mmHg), MAC, mild LAE    Past Surgical History  Procedure Laterality Date  . Knee [name]    . Cholecystectomy    . Tonsillectomy    . Cardiac catheterization N/A 05/15/2015    Procedure: Right/Left Heart Cath and Coronary Angiography;  Surgeon: [name] Mocha, MD;  Location: [facility];  Service: Cardiovascular;  Laterality: N/A;    Family History  Problem Relation Age of Onset  . Stroke Mother   . Heart attack Mother 80    deceased  . Prostate cancer Father 69    Social History   Social History  . Marital Status: Married    Spouse Name: N/A  . Number of Children: 1  . Years of Education: N/A   Occupational History  . 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 Smoking status: Former Smoker -- 1.50 packs/day for 53 years    Types: Cigarettes    Quit date: 06/23/1999  . Smokeless tobacco: Former Systems developer    Types: Snuff, Chew  . Alcohol Use: No  . Drug Use: No  . Sexual Activity: Not on file   Other Topics Concern  . Not on file   Social History Narrative   Retired, print shop   Married 85+ years   1 daughter   Former smoker    Current Outpatient Prescriptions  Medication Sig Dispense Refill  . aspirin EC 81 MG tablet Take 1 tablet (81 mg total) by mouth daily.    . furosemide (LASIX) 20 MG tablet Take 1 tablet (20 mg total) by mouth daily. 30 tablet 6  . guaiFENesin (MUCINEX) 600 MG 12 hr tablet Take 1,200 mg by mouth 2 (two) times daily.     [name] LORazepam (ATIVAN) 1 MG tablet TAKE 1/4 TO 1/2 TABLET BY MOUTH EVERY 6 HOURS AS NEEDED 30 tablet 2  . OXYGEN Inhale into the lungs. 4 Liters    . PROAIR HFA 108 (90 BASE) MCG/ACT inhaler INHALE 1-2 PUFFS INTO THE LUNGS EVERY 6 (SIX) HOURS AS NEEDED FOR WHEEZING. 8.5 each 3  . Respiratory Therapy Supplies (FLUTTER) DEVI  Use as directed 1 each 0  . SYMBICORT 160-4.5 MCG/ACT inhaler INHALE 2 PUFFS BY MOUTH TWICE A DAY 10.2 Inhaler 5  . tiotropium (SPIRIVA HANDIHALER) 18 MCG inhalation capsule Place 1 capsule (18 mcg total) into inhaler and inhale daily. 30 capsule 6   No current facility-administered medications for this visit.    Allergies  Allergen Reactions  . Iohexol Hives and Swelling    Pt reports swelling, redness, hives, and blisters   . Atorvastatin     REACTION: aches  . Celecoxib     REACTION: rash  . Clinoril [Sulindac]     REACTION: rash  . Nitrofurantoin   . Sulfa Antibiotics     Intolerant but unrecalled.       Review of Systems:   General:  normal appetite, decreased energy, no weight gain, no weight loss, no fever  Cardiac:  + chest pain with exertion, no chest  pain at rest, + SOB with exertion, occasional resting SOB, + PND, no orthopnea, no palpitations, no arrhythmia, no atrial fibrillation, + LE edema, + dizzy spells, no syncope  Respiratory:  + chronic shortness of breath, + home oxygen, occasional productive cough, no dry cough, no bronchitis, + wheezing, no hemoptysis, no asthma, no pain with inspiration or cough, + sleep apnea, + CPAP at night  GI:   no difficulty swallowing, no reflux, no frequent heartburn, no hiatal hernia, no abdominal pain, + constipation, no diarrhea, no hematochezia, no hematemesis, no melena  GU:   + dysuria,  no frequency, no urinary tract infection, no hematuria, no enlarged prostate, remote h/o kidney stones, no kidney disease  Vascular:  + pain suggestive of claudication, + pain in feet, no leg cramps, + varicose veins, no DVT, no non-healing foot ulcer  Neuro:   no stroke, no TIA's, no seizures, occasional headaches, no temporary blindness one eye,  no slurred speech, no peripheral neuropathy, + chronic pain, + instability of gait, no memory/cognitive dysfunction  Musculoskeletal: + arthritis, no joint swelling, no myalgias, + significant  difficulty walking, very limited mobility   Skin:   + chronic rash both lower legs c/w venous stasis dermatitis, no itching, no skin infections, no pressure sores or ulcerations  Psych:   + anxiety, no depression, no nervousness, no unusual recent stress  Eyes:   no blurry vision, + floaters, no recent vision changes, + wears glasses or contacts  ENT:   no hearing loss, edentulous with + full set dentures  Hematologic:  + easy bruising, no abnormal bleeding, no clotting disorder, no frequent epistaxis  Endocrine:  no diabetes, does not check CBG's at home           Physical Exam:   BP 139/80 mmHg  Pulse 87  Resp 18  Ht 5\' 10"  (1.778 m)  Wt 268 lb (121.564 kg)  BMI 38.45 kg/m2  SpO2 91%  General:  Morbidly obese but well-appearing  HEENT:  Unremarkable   Neck:   no JVD, no bruits, no adenopathy   Chest:   clear to auscultation, symmetrical breath sounds, no wheezes, no rhonchi   CV:   RRR, grade III/VI crescendo/decrescendo murmur heard best at LSB,  no diastolic murmur  Abdomen:  soft, non-tender, no masses   Extremities:  warm, well-perfused, pulses not palpable, + mild LE edema  Rectal/GU  Deferred  Neuro:   Grossly non-focal and symmetrical throughout  Skin:   Clean and dry, no rashes, no breakdown, + severe chronic venous stasis dermatitis both lower legs   Diagnostic Tests:  Transthoracic Echocardiography  (Report amended )  Patient:  Jaemon, Polinsky MR #:    [id number] Study Date: 04/18/2015 Gender:   M Age:    44 Height:   177.8 cm Weight:   126.1 kg BSA:    2.55 m^2 Pt. Status: Room:  [place], [place], [place], Outpatient SONOGRAPHER [facility], RDCS  cc:  ------------------------------------------------------------------- LV EF: 55% -  60%  ------------------------------------------------------------------- Indications:   Dyspnea  (R06.00).  ------------------------------------------------------------------- History:  PMH:  Chronic obstructive pulmonary disease. Risk factors: Chronic Respiratory Failure (on 4 Liters home O2), Obstructive Sleep Apnea with CPAP. Family history of coronary artery disease. Former tobacco use.  ------------------------------------------------------------------- Study Conclusions  - Left ventricle: The cavity size was normal. Wall thickness was increased in a pattern of moderate LVH. Systolic function was normal. The estimated  ejection fraction was in the range of 55% to 60%. Wall motion was normal; there were no regional wall motion abnormalities. Doppler parameters are consistent with abnormal left ventricular relaxation (grade 1 diastolic dysfunction). Doppler parameters are consistent with high ventricular filling pressure. - Aortic valve: There was severe stenosis. - Mitral valve: Severely calcified annulus. - Left atrium: The atrium was mildly dilated.  Impressions:  - Normal LV systolic function; grade 1 diastolic dysfunction; elevated LV filling pressures; moderate LVH; calcified aortic valve with severe AS (mean gradient 51 mmHg); severe MAC; mild LAE.  Transthoracic echocardiography. M-mode, complete 2D, spectral Doppler, and color Doppler. Birthdate: Patient birthdate: 1933-09-10. Age: Patient is 82 yr old. Sex: Gender: male. BMI: 39.9 kg/m^2. Blood pressure:   122/80 Patient status: Outpatient. Study date: Study date: 04/18/2015. Study time: 01:25 PM. Location: Echo laboratory.  -------------------------------------------------------------------  ------------------------------------------------------------------- Left ventricle: The cavity size was normal. Wall thickness was increased in a pattern of moderate LVH. Systolic function was normal. The estimated ejection fraction was in the range of 55% to 60%. Wall motion was  normal; there were no regional wall motion abnormalities. Doppler parameters are consistent with abnormal left ventricular relaxation (grade 1 diastolic dysfunction). Doppler parameters are consistent with high ventricular filling pressure.  ------------------------------------------------------------------- Aortic valve:  Trileaflet; moderately calcified leaflets. Doppler:  There was severe stenosis.  There was no regurgitation.  VTI ratio of LVOT to aortic valve: 0.33. Indexed valve area (VTI): 0.45 cm^2/m^2. Peak velocity ratio of LVOT to aortic valve: 0.22. Indexed valve area (Vmax): 0.31 cm^2/m^2. Mean velocity ratio of LVOT to aortic valve: 0.28. Indexed valve area (Vmean): 0.38 cm^2/m^2.  Mean gradient (S): 51 mm Hg. Peak gradient (S): 86 mm Hg.  ------------------------------------------------------------------- Aorta: Aortic root: The aortic root was normal in size.  ------------------------------------------------------------------- Mitral valve:  Severely calcified annulus. Mobility was not restricted. Doppler: Transvalvular velocity was within the normal range. There was no evidence for stenosis. There was no regurgitation.  Valve area by pressure half-time: 3.55 cm^2. Indexed valve area by pressure half-time: 1.39 cm^2/m^2. Indexed valve area by continuity equation (using LVOT flow): 1.22 cm^2/m^2.  Mean gradient (D): 3 mm Hg. Peak gradient (D): 3 mm Hg.  ------------------------------------------------------------------- Left atrium: The atrium was mildly dilated.  ------------------------------------------------------------------- Right ventricle: The cavity size was normal. Systolic function was normal.  ------------------------------------------------------------------- Pulmonic valve:  Doppler: Transvalvular velocity was within the normal range. There was no evidence for  stenosis.  ------------------------------------------------------------------- Tricuspid valve:  Structurally normal valve.  Doppler: Transvalvular velocity was within the normal range. There was no regurgitation.  ------------------------------------------------------------------- Right atrium: The atrium was normal in size.  ------------------------------------------------------------------- Pericardium: There was no pericardial effusion.  ------------------------------------------------------------------- Systemic veins: Inferior vena cava: The vessel was normal in size. The respirophasic diameter changes were in the normal range (= 50%), consistent with normal central venous pressure. Diameter: 18.8 mm.  ------------------------------------------------------------------- Measurements  IVC                    Value     Reference ID                    18.8 mm    ---------  Left ventricle              Value     Reference LV ID, ED, PLAX chordal          44.3 mm    43 - 52 LV ID, ES, PLAX chordal  28.3 mm    23 - 38 LV fx shortening, PLAX chordal      36  %    >=29 LV PW thickness, ED            16  mm    --------- IVS/LV PW ratio, ED            0.86      <=1.3 Stroke volume, 2D             95  ml    --------- Stroke volume/bsa, 2D           37  ml/m^2  --------- LV ejection fraction, 1-p A4C       57  %    --------- LV end-diastolic volume, 2-p       131  ml    --------- LV end-systolic volume, 2-p        61  ml    --------- LV ejection fraction, 2-p         53  %    --------- Stroke volume, 2-p            70  ml    --------- LV end-diastolic volume/bsa, 2-p     51  ml/m^2  --------- LV end-systolic volume/bsa,  2-p      24  ml/m^2  --------- Stroke volume/bsa, 2-p          27.4 ml/m^2  --------- LV e&', lateral              6.58 cm/s   --------- LV E/e&', lateral             13.88     --------- LV e&', medial               4.5  cm/s   --------- LV E/e&', medial              20.29     --------- LV e&', average              5.54 cm/s   --------- LV E/e&', average             16.48     ---------  Ventricular septum            Value     Reference IVS thickness, ED             13.8 mm    ---------  LVOT                   Value     Reference LVOT ID, S                21  mm    --------- LVOT area                 3.46 cm^2   --------- LVOT peak velocity, S           104  cm/s   --------- LVOT mean velocity, S           76.2 cm/s   --------- LVOT VTI, S                27.6 cm    --------- LVOT peak gradient, S           4   mm Hg  ---------  Aortic valve               Value     Reference Aortic valve peak  velocity, S       464  cm/s   --------- Aortic valve mean velocity, S       270  cm/s   --------- Aortic valve VTI, S            84  cm    --------- Aortic mean gradient, S          51  mm Hg  --------- Aortic peak gradient, S          86  mm Hg  --------- VTI ratio, LVOT/AV            0.33      --------- Aortic valve area/bsa, VTI        0.45 cm^2/m^2 --------- Velocity ratio, peak, LVOT/AV       0.22      --------- Aortic valve area/bsa, peak        0.31 cm^2/m^2 --------- velocity Velocity ratio, mean, LVOT/AV       0.28      --------- Aortic valve  area/bsa, mean        0.38 cm^2/m^2 --------- velocity  Aorta                   Value     Reference Aortic root ID, ED            34  mm    ---------  Left atrium                Value     Reference LA ID, A-P, ES              45  mm    --------- LA ID/bsa, A-P              1.76 cm/m^2  <=2.2 LA volume, S               69  ml    --------- LA volume/bsa, S             27.1 ml/m^2  --------- LA volume, ES, 1-p A4C          58  ml    --------- LA volume/bsa, ES, 1-p A4C        22.7 ml/m^2  --------- LA volume, ES, 1-p A2C          78  ml    --------- LA volume/bsa, ES, 1-p A2C        30.6 ml/m^2  ---------  Mitral valve               Value     Reference Mitral E-wave peak velocity        91.3 cm/s   --------- Mitral A-wave peak velocity        143  cm/s   --------- Mitral mean velocity, D          75.4 cm/s   --------- Mitral deceleration time     (H)   293  ms    150 - 230 Mitral pressure half-time         62  ms    --------- Mitral mean gradient, D          3   mm Hg  --------- Mitral peak gradient, D          3   mm Hg  --------- Mitral E/A ratio, peak          0.64      --------- Mitral valve area, PHT, DP  3.55 cm^2   --------- Mitral valve area/bsa, PHT, DP      1.39 cm^2/m^2 --------- Mitral valve area/bsa, LVOT        1.22 cm^2/m^2 --------- continuity Mitral annulus VTI, D           30.8 cm    ---------  Right ventricle              Value     Reference RV s&', lateral, S             18  cm/s   ---------  Legend: (L) and (H) mark values outside specified reference  range.  ------------------------------------------------------------------- [name] [name] 2016-11-02T15:58:53   CARDIAC CATHETERIZATION Procedures    Right/Left Heart Cath and Coronary Angiography    Conclusion    1. Large, dominant RCA, possibly with a single coronary artery supplying an anomalous left circumflex and supplying distal circulation to the LAD territory 2. No evidence of obstructive coronary artery disease 3. 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 Severely calcified aortic valve with restricted mobility, hemodynamics demonstrating a mean transaortic gradient of 44 mmHg and calculated aortic valve area of 1 cm. 5. Mild pulmonary HTN and elevated right heart pressures, likely related to LV diastolic dysfunction  Recommendations: Gated cardiac CTA and CTA of the chest/abdomen/pelvis for TAVR evaluation. Cardiac surgical consultation for treatment options of severe symptomatic aortic stenosis.    Indications    Severe aortic stenosis [I35.0 (ICD-10-CM)]    Technique and Indications    INDICATION: Severe aortic stenosis  PROCEDURAL DETAILS: There was an indwelling IV in a left antecubital vein. Using normal sterile technique, the IV was changed out for a 5 Fr brachial sheath over a 0.018 inch wire. The left wrist was then prepped, draped, and anesthetized with 1% lidocaine. Using the modified Seldinger technique a 5/6 French Slender sheath was placed in the left radial artery. Intra-arterial verapamil was administered through the radial artery sheath. IV heparin was administered after a JR4 catheter was advanced into the central aorta. A Swan-Ganz catheter was used for the right heart catheterization. Standard protocol was followed for recording of right heart pressures and sampling of oxygen saturations. Fick cardiac output was calculated. Standard Judkins catheters were used for selective coronary angiography and left ventriculography. There were no  immediate procedural complications. The patient was transferred to the post catheterization recovery area for further monitoring.    Estimated blood loss <50 mL. There were no immediate complications during the procedure.    Coronary Findings    Dominance: Right   Left Anterior Descending  The LAD is not visualized from the aortic root. There is no left main visualized. It appears the LAD territory fills from the distal branch vessels of the RCA.     Left Circumflex  The left circumflex has an anomalous origin, arising from the right cusp. There is no obstructive disease present.     Right Coronary Artery  The right coronary artery is a large, dominant vessel. The distal PDA and PLA branches appear to supply the LAD territory.       Right Heart Pressures Hemodynamic findings consistent with mild pulmonary hypertension. Elevated LV EDP consistent with volume overload.    Left Heart    Aortic Valve There is severe aortic valve stenosis, and trivial (1+) aortic regurgitation. The aortic valve is calcified. There is restricted aortic valve motion.    Coronary Diagrams    Diagnostic Diagram  Implants    Name ID Temporary Type Supply   No information to display    PACS Images    Show images for Cardiac catheterization     Link to Procedure Log    Procedure Log      Hemo Data       Most Recent Value   Fick Cardiac Output  7.49 L/min   Fick Cardiac Output Index  3.16 (L/min)/BSA   Aortic Mean Gradient  44.1 mmHg   Aortic Peak Gradient  39 mmHg   Aortic Valve Area  1.02   Aortic Value Area Index  0.43 cm2/BSA   RA A Wave  18 mmHg   RA V Wave  17 mmHg   RA Mean  15 mmHg   RV Systolic Pressure  49 mmHg   RV Diastolic Pressure  15 mmHg   RV EDP  18 mmHg   PA Systolic Pressure  49 mmHg   PA Diastolic Pressure  22 mmHg   PA Mean  34 mmHg   PW A Wave  29 mmHg   PW V Wave  25 mmHg   PW Mean  24 mmHg   AO Systolic  Pressure  [id number] mmHg   AO Diastolic Pressure  71 mmHg   AO Mean  98 mmHg   LV Systolic Pressure  [id number] mmHg   LV Diastolic Pressure  22 mmHg   LV EDP  28 mmHg   Arterial Occlusion Pressure Extended Systolic Pressure  [id number] mmHg   Arterial Occlusion Pressure Extended Diastolic Pressure  77 mmHg   Arterial Occlusion Pressure Extended Mean Pressure  112 mmHg   QP/QS  1   TPVR Index  10.76 HRUI   TSVR Index  31.02 HRUI   PVR SVR Ratio  0.12   TPVR/TSVR Ratio  0.35    Cardiac TAVR CT TECHNIQUE: The patient was scanned on a Philips 256 scanner. A 120 kV retrospective scan was triggered in the descending thoracic aorta at 111 HU's. Gantry rotation speed was 270 msecs and collimation was .9 mm. 15 mg of iv metoprolol and 0.4 sl NTG were given. The 3D data set was reconstructed in 5% intervals of the R-R cycle. Systolic and diastolic phases were analyzed on a dedicated work station using MPR, MIP and VRT modes. The patient received 80 cc of contrast. FINDINGS: Aortic Valve: Severely thickened and calcified trileaflet aortic valve with severely restricted leaflet opening and mild subvalvular calcifications. Aorta: Normal caliber. Mild diffuse calcifications. No dissection. Sinotubular Junction: 30 x 28 mm Ascending Thoracic Aorta: 37 x 36 mm Aortic Arch: 32 x 31 mm Descending Thoracic Aorta: 28 x 27 mm Sinus of Valsalva Measurements: Non-coronary: 31 mm Right -coronary: 31 mm Left -coronary: 30 mm Coronary Artery Height above Annulus: Left Main: There is no left main artery Right Coronary: 12 mm Virtual Basal Annulus Measurements: Maximum/Minimum Diameter: 28 x 25 mm Perimeter: 92 mm Area: 548 mm2 Coronary Arteries: There are anomalous coronary arteries. No left main or other artery is originating from the left sinus of Valsalva. RCA is a very large tortuous vessel that gives rise to a large PDA and a large PLA that also gives a branch supplying  inferolateral wall. There is only mild calcified plaque with associated stenosis 0-25%. There is a small LAD originating from the proximal portion of RCA with interarterial course (between aorta and pulmonary artery and extending into mid to distal portion of the interventricular sulcus). There is another small branch originating from the proximal RCA (  distally to LAD) with anterior course (anteriorly to the pulmonary artery) and extending into proximal portions of the AV groove (rudimentary LCX artery). Optimum Fluoroscopic Angle for Delivery: LAO 16 CRA 0. IMPRESSION: 1. Severely thickened and calcified trileaflet aortic valve with severely restricted leaflet opening and annular measurements suitable for delivery of 29 mm Edward-SAPIEN 3 valve. The area is borderline between 26 and 29 mm valve, however diameter measurements consistent with 29 mm valve. 2. 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 Only mild CAD is seen. 3. Optimum Fluoroscopic Angle for Delivery: LAO 78 CRA 0. Ena Dawley Electronically Signed  By: Ena Dawley  On: 05/23/2015 08:53   CT ANGIOGRAPHY CHEST, ABDOMEN AND PELVIS  TECHNIQUE: Multidetector CT imaging through the chest, abdomen and pelvis was performed using the standard protocol during bolus administration of intravenous contrast. Multiplanar reconstructed images and MIPs were obtained and reviewed to evaluate the vascular anatomy.  CONTRAST: 3mL OMNIPAQUE IOHEXOL 350 MG/ML SOLN  COMPARISON: Chest CT 04/18/2015.  FINDINGS: CTA CHEST FINDINGS  Mediastinum/Lymph Nodes: Heart size is borderline enlarged. Concentric left ventricular hypertrophy. There is no significant pericardial fluid, thickening or pericardial calcification. There is atherosclerosis of the thoracic aorta, the great vessels of the mediastinum and the coronary  arteries, including calcified atherosclerotic plaque in the right coronary artery. No pathologically enlarged mediastinal or hilar lymph nodes. 1.9 x 2.1 cm lesion in the anterior mediastinum immediately anterior to the left innominate vein only slightly larger than remote prior examination from 03/03/2006, likely to represent a benign cystic lesion, potentially of thymic origin. Esophagus is unremarkable in appearance. No axillary lymphadenopathy.  Lungs/Pleura: 4 mm right upper lobe nodule (image 18 of series 407). Linear scarring in the left lower lobe is unchanged. Small cluster of peribronchovascular micronodules noted in the right middle lobe on the prior study have resolved, presumably areas of benign mucoid impaction on the prior examination. No acute consolidative airspace disease. No pleural effusions.  Musculoskeletal/Soft Tissues: There are no aggressive appearing lytic or blastic lesions noted in the visualized portions of the skeleton.  CTA ABDOMEN AND PELVIS FINDINGS  Hepatobiliary: Diffuse low attenuation throughout the hepatic parenchyma, compatible with hepatic steatosis. No cystic or solid hepatic lesions. No intra or extrahepatic biliary ductal dilatation. Status post cholecystectomy.  Pancreas: No pancreatic mass. No pancreatic ductal dilatation. No pancreatic or peripancreatic fluid or inflammatory changes.  Spleen: Unremarkable.  Adrenals/Urinary Tract: Bilateral adrenal glands are normal in appearance. Multiple low-attenuation lesions in the kidneys bilaterally, the largest of which are compatible with simple cysts, including the largest lesion which is exophytic measuring 6.5 cm in the upper pole of the right kidney. Several other smaller sub cm low-attenuation lesions are noted in the kidneys bilaterally, too small to definitively characterize, but also favored to represent tiny cysts. No hydroureteronephrosis. Urinary bladder is normal  in appearance.  Stomach/Bowel: Normal appearance of the stomach. Numerous colonic diverticulae are noted, particularly in the sigmoid colon, without surrounding inflammatory changes to suggest an acute diverticulitis at this time. Normal appendix.  Vascular/Lymphatic: Extensive atherosclerosis throughout the abdominal and pelvic vasculature, with vascular findings and measurements pertinent to potential TAVR procedure, as detailed below. In addition, there is fusiform aneurysmal dilatation of the infrarenal abdominal aorta which measures up to 3.8 x 3.6 cm immediately beneath the inferior mesenteric artery (IMA) origin. Single renal arteries are noted bilaterally. Celiac axis, superior mesenteric artery and inferior mesenteric artery are all widely patent.  No lymphadenopathy noted in the abdomen or pelvis.  Reproductive: Prostate gland and seminal vesicles are unremarkable in appearance.  Other: No significant volume of ascites. No pneumoperitoneum.  Musculoskeletal: There are no aggressive appearing lytic or blastic lesions noted in the visualized portions of the skeleton.  VASCULAR MEASUREMENTS PERTINENT TO TAVR:  AORTA:  Minimal Aortic Diameter - 23 x 22 mm  Severity of Aortic Calcification - moderate to severe  RIGHT PELVIS:  Right Common Iliac Artery -  Minimal Diameter - 12.3 x 12.5 mm  Tortuosity - mild  Calcification - mild  Right External Iliac Artery -  Minimal Diameter - 9.2 x 9.0 mm  Tortuosity - moderate  Calcification - none  Right Common Femoral Artery -  Minimal Diameter - 8.1 x 6.5 mm  Tortuosity - mild  Calcification - mild  LEFT PELVIS:  Left Common Iliac Artery -  Minimal Diameter - 11.8 x 9.6 mm  Tortuosity - mild  Calcification - moderate  Left External Iliac Artery -  Minimal Diameter - 9.7 x 9.4 mm  Tortuosity - moderate  Calcification - none  Left Common Femoral Artery  -  Minimal Diameter - 10.1 x 9.6 mm  Tortuosity - mild  Calcification - mild  Review of the MIP images confirms the above findings.  IMPRESSION: 1. Vascular findings and measurements pertinent to potential TAVR procedure, as detailed above. This patient does appear to have suitable pelvic arterial access bilaterally. 2. Mild fusiform aneurysmal dilatation of the infrarenal abdominal aorta which measures up to 3.8 x 3.6 cm. Recommend followup by ultrasound in 2 years. This recommendation follows ACR consensus guidelines: White Paper of the ACR Incidental Findings Committee II on Vascular Findings. J Am Coll Radiol 2013; 10:789-794. 3. Severe thickening calcification of the aortic valve, compatible with the patient's reported clinical history of aortic stenosis. 4. Mild colonic diverticulosis without evidence of acute diverticulitis at this time. 5. Hepatic steatosis. 6. Additional incidental findings, as above.   Electronically Signed  By: Vinnie Langton M.D.  On: 05/22/2015 13:46   Pulmonary Function Tests        FVC  1.52 L  (35% predicted)  FEV1  0.77 L  (24% predicted)  FEF25-75 0.31 L  (11% predicted)    STS Risk Calculator  Procedure    AVR  Risk of Mortality   3.4% Morbidity or Mortality  20.2% Prolonged LOS   9.7% Short LOS    27.4% Permanent Stroke   1.1% Prolonged Vent Support  14.5% DSW Infection    1.0% Renal Failure    5.8% Reoperation    7.7%    Impression:  Patient has stage D severe symptomatic aortic stenosis. He has long-standing history of chronic exertional shortness of breath that has been attributed to severe COPD and obstructive sleep apnea. However, he quit smoking in 2001 and over the past year he has developed progressive symptoms of exertional shortness of breath consistent with chronic diastolic congestive heart failure, New York Heart Association functional class III. I have personally reviewed the patient's recent  transthoracic echocardiogram, diagnostic cardiac catheterization, and CT angiograms. Transthoracic echocardiogram confirms the presence of severe aortic stenosis with severe thickening and restricted leaflet mobility involving all 3 leaflets of the patient's aortic valve. Peak velocity across the aortic valve measured 4.6 m/s corresponding to mean transvalvular gradient estimated at 51 mmHg. Left ventricular systolic function remains preserved. Catheterization confirmed the presence of severe aortic stenosis and was notable for the absence of significant coronary artery disease. 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 Risks associated with conventional surgical aortic valve replacement would clearly be very high and likely much higher than that predicted using the STS risk calculator. The patient has known severe COPD but he also has very limited physical mobility. I do not think he would likely do very well with conventional [name]. CT angiography confirmed anatomical findings that appear relatively favorable for transcatheter aortic valve replacement be a transfemoral approach. I agree this would be the patient's best alternative.   Plan:  The patient and his wife were counseled at length regarding treatment alternatives for management of severe symptomatic aortic stenosis. Alternative approaches such as conventional aortic valve replacement, transcatheter aortic valve replacement, and palliative medical therapy were compared and contrasted at length.  The risks associated with conventional surgical aortic valve replacement were been discussed in detail, as were expectations for post-operative convalescence. Long-term prognosis with medical therapy was discussed. This discussion was placed in the context of the patient's own specific clinical presentation and past medical history.  All of their questions been addressed.  Based  upon review of all of the patient's preoperative diagnostic tests they are felt to be candidate for transcatheter aortic valve replacement using the transfemoral approach as an alternative to high risk conventional [name].    Following the decision to proceed with transcatheter aortic valve replacement, a discussion has been held regarding what types of management strategies would be attempted intraoperatively in the event of life-threatening complications, including whether or not the patient would be considered a candidate for the use of cardiopulmonary bypass and/or conversion to open sternotomy for attempted surgical intervention.  The patient specifically requests that he would not be considered a candidate for emergent median sternotomy.    The patient has been advised of a variety of complications that might develop including but not limited to risks of death, stroke, paravalvular leak, aortic dissection or other major vascular complications, aortic annulus rupture, device embolization, cardiac rupture or perforation, mitral regurgitation, acute myocardial infarction, arrhythmia, heart block or bradycardia requiring permanent pacemaker placement, congestive heart failure, respiratory failure, renal failure, pneumonia, infection, other late complications related to structural valve deterioration or migration, or other complications that might ultimately cause a temporary or permanent loss of functional independence or other long term morbidity.  We plan to proceed with transcatheter aortic valve replacement via transfemoral approach on 06/26/2015.  The patient provides full informed consent for the procedure as described and all questions were answered.  Because of the patient's reported allergy to IV contrast material he will receive intravenous steroids on arrival in short day on the morning of [name].      Valentina Gu. Roxy Manns, MD 06/21/2015 11:07 AM

## 2015-06-21 NOTE — Pre-Procedure Instructions (Signed)
[name] [name]  06/21/2015      [id number] [address], [place] [address] Phone: [phone] Fax: [phone]    Your procedure is scheduled on Tuesday, January 10th   Report to [facility] Admitting at 5:30 AM            (Surgery time 7:30 - 10:15 AM)   Call this number if you have problems the morning of surgery:  [phone]   Remember:  Do not eat food or drink liquids after midnight Monday.    Take these medicines the morning of surgery with A SIP OF WATER : Ativan.  Please use inhalers that morning.                Do not wear jewelry - no rings or watches.   Do not wear lotions or colognes.   You may NOT wear deodorant the day of surgery.              Men may shave face and neck.   Do not bring valuables to the hospital.   [facility] is not responsible for any belongings or valuables.  Contacts, dentures or bridgework may not be worn into surgery.  Leave your suitcase in the car.  After surgery it may be brought to your room. For patients admitted to the hospital, discharge time will be determined by your treatment team.   Name and phone number of your driver:   /w family  Special instructions:  Special Instructions: [place] - Preparing for Surgery  Before surgery, you can play an important role.  Because skin is not sterile, your skin needs to be as free of germs as possible.  You can reduce the number of germs on you skin by washing with CHG (chlorahexidine gluconate) soap before surgery.  CHG is an antiseptic cleaner which kills germs and bonds with the skin to continue killing germs even after washing.  Please DO NOT use if you have an allergy to CHG or antibacterial soaps.  If your skin becomes reddened/irritated stop using the CHG and inform your nurse when you arrive at Short Stay.  Do not shave (including legs and underarms) for at least 48 hours prior to the first CHG shower.  You may  shave your face.  Please follow these instructions carefully:   1.  Shower with CHG Soap the night before surgery and the  morning of Surgery.  2.  If you choose to wash your hair, wash your hair first as usual with your  normal shampoo.  3.  After you shampoo, rinse your hair and body thoroughly to remove the  Shampoo.  4.  Use CHG as you would any other liquid soap.  You can apply chg directly to the skin and wash gently with scrungie or a clean washcloth.  5.  Apply the CHG Soap to your body ONLY FROM THE NECK DOWN.    Do not use on open wounds or open sores.  Avoid contact with your eyes, ears, mouth and genitals (private parts).  Wash genitals (private parts)   with your normal soap.  6.  Wash thoroughly, paying special attention to the area where your surgery will be performed.  7.  Thoroughly rinse your body with warm water from the neck down.  8.  DO NOT shower/wash with your normal soap after using and rinsing off   the CHG Soap.  9.  Pat yourself  dry with a clean towel.            10.  Wear clean pajamas.            11.  Place clean sheets on your bed the night of your first shower and do not sleep with pets.  Day of Surgery  Do not apply any lotions/deodorants the morning of surgery.  Please wear clean clothes to the hospital/surgery center.   Please read over the following fact sheets that you were given.  Pain Booklet, Coughing and Deep Breathing, Blood Transfusion Information, MRSA Information and Surgical Site Infection Prevention

## 2015-06-21 NOTE — Patient Instructions (Signed)
  Patient should continue taking all current medications without change through the day before surgery.  Patient should have nothing to eat or drink after midnight the night before surgery.  On the morning of surgery patient should take only your inhalers and Prednisone

## 2015-06-22 ENCOUNTER — Encounter (HOSPITAL_COMMUNITY)
Admission: RE | Admit: 2015-06-22 | Discharge: 2015-06-22 | Disposition: A | Discharge status: Home / Self Care | Payer: PPO | Source: Ambulatory Visit | Attending: Cardiovascular Disease | Admitting: Cardiovascular Disease

## 2015-06-22 ENCOUNTER — Encounter (HOSPITAL_COMMUNITY): Payer: Self-pay

## 2015-06-22 VITALS — BP 125/56 | HR 80 | Temp 97.9°F | Resp 18 | Ht 70.0 in | Wt 268.0 lb

## 2015-06-22 DIAGNOSIS — Z9981 Dependence on supplemental oxygen: Secondary | ICD-10-CM | POA: Insufficient documentation

## 2015-06-22 DIAGNOSIS — I350 Nonrheumatic aortic (valve) stenosis: Secondary | ICD-10-CM | POA: Insufficient documentation

## 2015-06-22 DIAGNOSIS — K760 Fatty (change of) liver, not elsewhere classified: Secondary | ICD-10-CM | POA: Diagnosis not present

## 2015-06-22 DIAGNOSIS — J449 Chronic obstructive pulmonary disease, unspecified: Secondary | ICD-10-CM | POA: Insufficient documentation

## 2015-06-22 DIAGNOSIS — G4733 Obstructive sleep apnea (adult) (pediatric): Secondary | ICD-10-CM | POA: Diagnosis not present

## 2015-06-22 DIAGNOSIS — Z0183 Encounter for blood typing: Secondary | ICD-10-CM | POA: Diagnosis not present

## 2015-06-22 DIAGNOSIS — F419 Anxiety disorder, unspecified: Secondary | ICD-10-CM | POA: Diagnosis not present

## 2015-06-22 DIAGNOSIS — Z01812 Encounter for preprocedural laboratory examination: Secondary | ICD-10-CM | POA: Diagnosis not present

## 2015-06-22 DIAGNOSIS — I4510 Unspecified right bundle-branch block: Secondary | ICD-10-CM | POA: Diagnosis not present

## 2015-06-22 DIAGNOSIS — R9431 Abnormal electrocardiogram [ECG] [EKG]: Secondary | ICD-10-CM | POA: Insufficient documentation

## 2015-06-22 DIAGNOSIS — I714 Abdominal aortic aneurysm, without rupture: Secondary | ICD-10-CM | POA: Insufficient documentation

## 2015-06-22 DIAGNOSIS — Z01818 Encounter for other preprocedural examination: Secondary | ICD-10-CM | POA: Diagnosis not present

## 2015-06-22 HISTORY — DX: Abdominal aortic aneurysm, without rupture, unspecified: I71.40

## 2015-06-22 HISTORY — DX: Reserved for inherently not codable concepts without codable children: IMO0001

## 2015-06-22 HISTORY — DX: Abdominal aortic aneurysm, without rupture: I71.4

## 2015-06-22 LAB — CBC
HCT: 40.9 % (ref 39.0–52.0)
Hemoglobin: 13 g/dL (ref 13.0–17.0)
MCH: 30.4 pg (ref 26.0–34.0)
MCHC: 31.8 g/dL (ref 30.0–36.0)
MCV: 95.8 fL (ref 78.0–100.0)
PLATELETS: 245 10*3/uL (ref 150–400)
RBC: 4.27 MIL/uL (ref 4.22–5.81)
RDW: 13.4 % (ref 11.5–15.5)
WBC: 6.1 10*3/uL (ref 4.0–10.5)

## 2015-06-22 LAB — ABO/RH: ABO/RH(D): A POS

## 2015-06-22 LAB — BLOOD GAS, ARTERIAL
ACID-BASE EXCESS: 5.2 mmol/L — AB (ref 0.0–2.0)
Bicarbonate: 30.2 mEq/L — ABNORMAL HIGH (ref 20.0–24.0)
DRAWN BY: 206361
O2 CONTENT: 4 L/min
O2 Saturation: 91.7 %
PATIENT TEMPERATURE: 98.6
TCO2: 31.8 mmol/L (ref 0–100)
pCO2 arterial: 52.4 mmHg — ABNORMAL HIGH (ref 35.0–45.0)
pH, Arterial: 7.379 (ref 7.350–7.450)
pO2, Arterial: 62.3 mmHg — ABNORMAL LOW (ref 80.0–100.0)

## 2015-06-22 LAB — COMPREHENSIVE METABOLIC PANEL
ALBUMIN: 3.6 g/dL (ref 3.5–5.0)
ALT: 15 U/L — AB (ref 17–63)
AST: 19 U/L (ref 15–41)
Alkaline Phosphatase: 104 U/L (ref 38–126)
Anion gap: 10 (ref 5–15)
BUN: 10 mg/dL (ref 6–20)
CHLORIDE: 103 mmol/L (ref 101–111)
CO2: 30 mmol/L (ref 22–32)
CREATININE: 0.65 mg/dL (ref 0.61–1.24)
Calcium: 9.4 mg/dL (ref 8.9–10.3)
GFR calc Af Amer: >60 mL/min (ref >60)
GFR calc non Af Amer: >60 mL/min (ref >60)
GLUCOSE: 108 mg/dL — AB (ref 65–99)
Potassium: 4.3 mmol/L (ref 3.5–5.1)
SODIUM: 143 mmol/L (ref 135–145)
Total Bilirubin: 1 mg/dL (ref 0.3–1.2)
Total Protein: 6.7 g/dL (ref 6.5–8.1)

## 2015-06-22 LAB — URINE MICROSCOPIC-ADD ON: RBC / HPF: NONE SEEN RBC/hpf (ref 0–5)

## 2015-06-22 LAB — PROTIME-INR
INR: 1.12 (ref 0.00–1.49)
Prothrombin Time: 14.6 seconds (ref 11.6–15.2)

## 2015-06-22 LAB — URINALYSIS, ROUTINE W REFLEX MICROSCOPIC
Bilirubin Urine: NEGATIVE
GLUCOSE, UA: NEGATIVE mg/dL
Hgb urine dipstick: NEGATIVE
Ketones, ur: NEGATIVE mg/dL
Nitrite: NEGATIVE
PH: 6 (ref 5.0–8.0)
PROTEIN: NEGATIVE mg/dL
Specific Gravity, Urine: 1.02 (ref 1.005–1.030)

## 2015-06-22 LAB — SURGICAL PCR SCREEN
MRSA, PCR: NEGATIVE
STAPHYLOCOCCUS AUREUS: NEGATIVE

## 2015-06-22 LAB — APTT: aPTT: 33 seconds (ref 24–37)

## 2015-06-22 NOTE — Progress Notes (Signed)
Requesting anesth. Consult for EKG review & lab result; specifically the ABG

## 2015-06-22 NOTE — Progress Notes (Signed)
Call to [facility], TCT, due to pt. Asking for additional information.  OK to give pt. & wife heart surgery book.

## 2015-06-23 LAB — HEMOGLOBIN A1C
Hgb A1c MFr Bld: 5.8 % — ABNORMAL HIGH (ref 4.8–5.6)
MEAN PLASMA GLUCOSE: 120 mg/dL

## 2015-06-25 ENCOUNTER — Encounter (HOSPITAL_COMMUNITY): Payer: Self-pay

## 2015-06-25 DIAGNOSIS — J449 Chronic obstructive pulmonary disease, unspecified: Secondary | ICD-10-CM | POA: Diagnosis not present

## 2015-06-25 MED ORDER — NOREPINEPHRINE BITARTRATE 1 MG/ML IV SOLN
0.0000 ug/min | INTRAVENOUS | Status: DC
  Filled 2015-06-25: qty 4

## 2015-06-25 MED ORDER — DEXMEDETOMIDINE HCL IN NACL 400 MCG/100ML IV SOLN
0.1000 ug/kg/h | INTRAVENOUS | Status: AC
Start: 2015-06-26 — End: 2015-06-26
  Administered 2015-06-26: .5 ug/kg/h via INTRAVENOUS
  Filled 2015-06-25: qty 100

## 2015-06-25 MED ORDER — METHYLPREDNISOLONE SODIUM SUCC 125 MG IJ SOLR
125.0000 mg | INTRAMUSCULAR | Status: AC
  Administered 2015-06-26: 125 mg via INTRAVENOUS

## 2015-06-25 MED ORDER — DEXTROSE 5 % IV SOLN
1.5000 g | INTRAVENOUS | Status: AC
  Administered 2015-06-26: 1.5 g via INTRAVENOUS
  Filled 2015-06-25: qty 1.5

## 2015-06-25 MED ORDER — SODIUM CHLORIDE 0.9 % IV SOLN: INTRAVENOUS | Status: DC

## 2015-06-25 MED ORDER — CHLORHEXIDINE GLUCONATE 4 % EX LIQD
60.0000 mL | Freq: Once | CUTANEOUS | Status: DC
Start: 2015-06-26 — End: 2015-06-26

## 2015-06-25 MED ORDER — CHLORHEXIDINE GLUCONATE 4 % EX LIQD: 60.0000 mL | Freq: Once | CUTANEOUS | Status: DC

## 2015-06-25 MED ORDER — MAGNESIUM SULFATE 50 % IJ SOLN
40.0000 meq | INTRAMUSCULAR | Status: DC
  Filled 2015-06-25: qty 10

## 2015-06-25 MED ORDER — PHENYLEPHRINE HCL 10 MG/ML IJ SOLN
30.0000 ug/min | INTRAVENOUS | Status: DC
  Filled 2015-06-25: qty 2

## 2015-06-25 MED ORDER — VANCOMYCIN HCL 10 G IV SOLR
1500.0000 mg | INTRAVENOUS | Status: AC
  Administered 2015-06-26: 08:00:00 via INTRAVENOUS
  Administered 2015-06-26: 1500 mg via INTRAVENOUS
  Filled 2015-06-25 (×2): qty 1500

## 2015-06-25 MED ORDER — CHLORHEXIDINE GLUCONATE 4 % EX LIQD: 30.0000 mL | CUTANEOUS | Status: DC

## 2015-06-25 MED ORDER — CHLORHEXIDINE GLUCONATE 0.12 % MT SOLN: 15.0000 mL | Freq: Once | OROMUCOSAL | Status: DC

## 2015-06-25 MED ORDER — DOPAMINE-DEXTROSE 3.2-5 MG/ML-% IV SOLN
0.0000 ug/kg/min | INTRAVENOUS | Status: DC
  Filled 2015-06-25: qty 250

## 2015-06-25 MED ORDER — SODIUM CHLORIDE 0.9 % IV SOLN
INTRAVENOUS | Status: DC
  Filled 2015-06-25: qty 2.5

## 2015-06-25 MED ORDER — DIPHENHYDRAMINE HCL 50 MG/ML IJ SOLN
25.0000 mg | INTRAMUSCULAR | Status: AC
  Administered 2015-06-26: 25 mg via INTRAVENOUS

## 2015-06-25 MED ORDER — EPINEPHRINE HCL 1 MG/ML IJ SOLN
0.0000 ug/min | INTRAVENOUS | Status: DC
  Filled 2015-06-25: qty 4

## 2015-06-25 MED ORDER — NITROGLYCERIN IN D5W 200-5 MCG/ML-% IV SOLN
2.0000 ug/min | INTRAVENOUS | Status: DC
  Filled 2015-06-25: qty 250

## 2015-06-25 MED ORDER — SODIUM CHLORIDE 0.9 % IV SOLN
INTRAVENOUS | Status: DC
  Filled 2015-06-25: qty 30

## 2015-06-25 MED ORDER — POTASSIUM CHLORIDE 2 MEQ/ML IV SOLN
80.0000 meq | INTRAVENOUS | Status: DC
  Filled 2015-06-25: qty 40

## 2015-06-25 MED ORDER — FAMOTIDINE IN NACL 20-0.9 MG/50ML-% IV SOLN
20.0000 mg | INTRAVENOUS | Status: AC
  Administered 2015-06-26: 20 mg via INTRAVENOUS
  Filled 2015-06-25: qty 50

## 2015-06-25 NOTE — Progress Notes (Signed)
Anesthesia chart review: Patient is a 82 year old male scheduled for TAVR, transfemoral approach on 06/26/2015 by Dr. Burt Knack.  History includes severe AS, severe COPD on home oxygen (4L), OSA on CPAP, anxiety, nephrolithiasis, PUD, hyperglycemia, 3.8 cm infrarenal AAA and hepatic steatosis by 05/2015 CTA. PCP is Dr. [name]. Pulmonologist is Dr. [name]. Cardiologist is Dr. Burt Knack.  04/18/15 Echo: Impressions: - Normal LV systolic function; grade 1 diastolic dysfunction; elevated LV filling pressures; moderate LVH; calcified aortic valve with severe AS (mean gradient 51 mmHg); severe MAC; mild LAE.  05/15/15 RHC/LHC: Conclusions: 1. Large, dominant RCA, possibly with a single coronary artery supplying an anomalous left circumflex and supplying distal circulation to the LAD territory 2. No evidence of obstructive coronary artery disease 3. 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 Severely calcified aortic valve with restricted mobility, hemodynamics demonstrating a mean transaortic gradient of 44 mmHg and calculated aortic valve area of 1 cm. 5. Mild pulmonary HTN and elevated right heart pressures, likely related to LV diastolic dysfunction Recommendations: Gated cardiac CTA and CTA of the chest/abdomen/pelvis for TAVR evaluation. Cardiac surgical consultation for treatment options of severe symptomatic aortic stenosis.  04/11/15 PFTs: FVC1.52 L (35% predicted) FEV10.77 L (24% predicted) FEF25-750.31 L (11% predicted)  Preoperative EKG, chest x-ray, ABG, and labs noted. Labs are marked as reviewed by Dr. Burt Knack. A1c 5.8.   If no acute changes then I would anticipate that he could proceed as planned. Note the patient has a radiocontrast allergy and will need premedication before TAVR. (There are orders for Benadryl, Pepcid and Solu-Medrol in  Epic.)   George Hugh [facility] Short Stay Center/Anesthesiology Phone [phone] 06/25/2015 10:11 AM

## 2015-06-26 ENCOUNTER — Encounter (HOSPITAL_COMMUNITY)
Admission: RE | Disposition: A | Discharge status: Home / Self Care | Payer: PPO | Source: Ambulatory Visit | Attending: Cardiovascular Disease

## 2015-06-26 ENCOUNTER — Encounter (HOSPITAL_COMMUNITY): Payer: Self-pay | Admitting: Surgery

## 2015-06-26 ENCOUNTER — Inpatient Hospital Stay (HOSPITAL_COMMUNITY): Payer: PPO | Admitting: Certified Registered Nurse Anesthetist

## 2015-06-26 ENCOUNTER — Inpatient Hospital Stay (HOSPITAL_COMMUNITY): Payer: PPO

## 2015-06-26 ENCOUNTER — Inpatient Hospital Stay (HOSPITAL_COMMUNITY): Payer: PPO | Admitting: Vascular Surgery

## 2015-06-26 ENCOUNTER — Inpatient Hospital Stay (HOSPITAL_COMMUNITY)
Admission: RE | Admit: 2015-06-26 | Discharge: 2015-06-28 | DRG: 266 | Disposition: A | Discharge status: Home / Self Care | Payer: PPO | Source: Ambulatory Visit | Attending: Cardiovascular Disease | Admitting: Cardiovascular Disease

## 2015-06-26 DIAGNOSIS — R7303 Prediabetes: Secondary | ICD-10-CM | POA: Diagnosis present

## 2015-06-26 DIAGNOSIS — Z881 Allergy status to other antibiotic agents status: Secondary | ICD-10-CM | POA: Diagnosis not present

## 2015-06-26 DIAGNOSIS — R739 Hyperglycemia, unspecified: Secondary | ICD-10-CM | POA: Diagnosis present

## 2015-06-26 DIAGNOSIS — Z006 Encounter for examination for normal comparison and control in clinical research program: Secondary | ICD-10-CM | POA: Diagnosis not present

## 2015-06-26 DIAGNOSIS — J449 Chronic obstructive pulmonary disease, unspecified: Secondary | ICD-10-CM | POA: Diagnosis present

## 2015-06-26 DIAGNOSIS — I700 Atherosclerosis of aorta: Secondary | ICD-10-CM | POA: Diagnosis present

## 2015-06-26 DIAGNOSIS — G4733 Obstructive sleep apnea (adult) (pediatric): Secondary | ICD-10-CM | POA: Diagnosis not present

## 2015-06-26 DIAGNOSIS — I2510 Atherosclerotic heart disease of native coronary artery without angina pectoris: Secondary | ICD-10-CM | POA: Diagnosis present

## 2015-06-26 DIAGNOSIS — Z87891 Personal history of nicotine dependence: Secondary | ICD-10-CM

## 2015-06-26 DIAGNOSIS — J9610 Chronic respiratory failure, unspecified whether with hypoxia or hypercapnia: Secondary | ICD-10-CM | POA: Diagnosis present

## 2015-06-26 DIAGNOSIS — I714 Abdominal aortic aneurysm, without rupture: Secondary | ICD-10-CM | POA: Diagnosis not present

## 2015-06-26 DIAGNOSIS — Z888 Allergy status to other drugs, medicaments and biological substances status: Secondary | ICD-10-CM | POA: Diagnosis not present

## 2015-06-26 DIAGNOSIS — Z823 Family history of stroke: Secondary | ICD-10-CM | POA: Diagnosis not present

## 2015-06-26 DIAGNOSIS — Z8042 Family history of malignant neoplasm of prostate: Secondary | ICD-10-CM | POA: Diagnosis not present

## 2015-06-26 DIAGNOSIS — Z9981 Dependence on supplemental oxygen: Secondary | ICD-10-CM

## 2015-06-26 DIAGNOSIS — Z882 Allergy status to sulfonamides status: Secondary | ICD-10-CM | POA: Diagnosis not present

## 2015-06-26 DIAGNOSIS — Z6839 Body mass index (BMI) 39.0-39.9, adult: Secondary | ICD-10-CM

## 2015-06-26 DIAGNOSIS — Z8711 Personal history of peptic ulcer disease: Secondary | ICD-10-CM

## 2015-06-26 DIAGNOSIS — D62 Acute posthemorrhagic anemia: Secondary | ICD-10-CM | POA: Diagnosis not present

## 2015-06-26 DIAGNOSIS — I454 Nonspecific intraventricular block: Secondary | ICD-10-CM | POA: Diagnosis present

## 2015-06-26 DIAGNOSIS — Z954 Presence of other heart-valve replacement: Secondary | ICD-10-CM | POA: Diagnosis not present

## 2015-06-26 DIAGNOSIS — J439 Emphysema, unspecified: Secondary | ICD-10-CM | POA: Diagnosis present

## 2015-06-26 DIAGNOSIS — I5033 Acute on chronic diastolic (congestive) heart failure: Secondary | ICD-10-CM | POA: Diagnosis not present

## 2015-06-26 DIAGNOSIS — I10 Essential (primary) hypertension: Secondary | ICD-10-CM | POA: Diagnosis present

## 2015-06-26 DIAGNOSIS — I350 Nonrheumatic aortic (valve) stenosis: Principal | ICD-10-CM | POA: Diagnosis present

## 2015-06-26 DIAGNOSIS — R7309 Other abnormal glucose: Secondary | ICD-10-CM | POA: Diagnosis present

## 2015-06-26 DIAGNOSIS — Q245 Malformation of coronary vessels: Secondary | ICD-10-CM

## 2015-06-26 DIAGNOSIS — Z91041 Radiographic dye allergy status: Secondary | ICD-10-CM

## 2015-06-26 DIAGNOSIS — Z8249 Family history of ischemic heart disease and other diseases of the circulatory system: Secondary | ICD-10-CM

## 2015-06-26 DIAGNOSIS — I110 Hypertensive heart disease with heart failure: Secondary | ICD-10-CM | POA: Diagnosis not present

## 2015-06-26 DIAGNOSIS — Z952 Presence of prosthetic heart valve: Secondary | ICD-10-CM

## 2015-06-26 DIAGNOSIS — I272 Other secondary pulmonary hypertension: Secondary | ICD-10-CM | POA: Diagnosis present

## 2015-06-26 HISTORY — DX: Prediabetes: R73.03

## 2015-06-26 HISTORY — DX: Presence of prosthetic heart valve: Z95.2

## 2015-06-26 HISTORY — PX: TEE WITHOUT CARDIOVERSION: SHX5443

## 2015-06-26 HISTORY — PX: TRANSCATHETER AORTIC VALVE REPLACEMENT, TRANSFEMORAL: SHX6400

## 2015-06-26 LAB — POCT I-STAT 3, ART BLOOD GAS (G3+)
ACID-BASE EXCESS: 9 mmol/L — AB (ref 0.0–2.0)
ACID-BASE EXCESS: 6 mmol/L — AB (ref 0.0–2.0)
ACID-BASE EXCESS: 5 mmol/L — AB (ref 0.0–2.0)
Acid-Base Excess: 8 mmol/L — ABNORMAL HIGH (ref 0.0–2.0)
Acid-Base Excess: 12 mmol/L — ABNORMAL HIGH (ref 0.0–2.0)
BICARBONATE: 38.1 meq/L — AB (ref 20.0–24.0)
Bicarbonate: 36.5 mEq/L — ABNORMAL HIGH (ref 20.0–24.0)
Bicarbonate: 33.4 mEq/L — ABNORMAL HIGH (ref 20.0–24.0)
Bicarbonate: 32.4 mEq/L — ABNORMAL HIGH (ref 20.0–24.0)
Bicarbonate: 32.5 mEq/L — ABNORMAL HIGH (ref 20.0–24.0)
O2 SAT: 100 %
O2 Saturation: 100 %
O2 Saturation: 100 %
O2 Saturation: 96 %
O2 Saturation: 95 %
PCO2 ART: 52.1 mmHg — AB (ref 35.0–45.0)
PCO2 ART: 54.2 mmHg — AB (ref 35.0–45.0)
PH ART: 7.358 (ref 7.350–7.450)
PH ART: 7.415 (ref 7.350–7.450)
PH ART: 7.354 (ref 7.350–7.450)
PO2 ART: 82 mmHg (ref 80.0–100.0)
PO2 ART: 79 mmHg — AB (ref 80.0–100.0)
Patient temperature: 35.2
Patient temperature: 36.3
TCO2: 38 mmol/L (ref 0–100)
TCO2: 35 mmol/L (ref 0–100)
TCO2: 40 mmol/L (ref 0–100)
TCO2: 34 mmol/L (ref 0–100)
TCO2: 34 mmol/L (ref 0–100)
pCO2 arterial: 64.8 mmHg (ref 35.0–45.0)
pCO2 arterial: 55 mmHg — ABNORMAL HIGH (ref 35.0–45.0)
pCO2 arterial: 57.9 mmHg (ref 35.0–45.0)
pH, Arterial: 7.448 (ref 7.350–7.450)
pH, Arterial: 7.375 (ref 7.350–7.450)
pO2, Arterial: 332 mmHg — ABNORMAL HIGH (ref 80.0–100.0)
pO2, Arterial: 237 mmHg — ABNORMAL HIGH (ref 80.0–100.0)
pO2, Arterial: 202 mmHg — ABNORMAL HIGH (ref 80.0–100.0)

## 2015-06-26 LAB — CBC
HCT: 36.9 % — ABNORMAL LOW (ref 39.0–52.0)
HEMOGLOBIN: 11.6 g/dL — AB (ref 13.0–17.0)
MCH: 30.4 pg (ref 26.0–34.0)
MCHC: 31.4 g/dL (ref 30.0–36.0)
MCV: 96.6 fL (ref 78.0–100.0)
Platelets: 176 10*3/uL (ref 150–400)
RBC: 3.82 MIL/uL — ABNORMAL LOW (ref 4.22–5.81)
RDW: 13.4 % (ref 11.5–15.5)
WBC: 6.9 10*3/uL (ref 4.0–10.5)

## 2015-06-26 LAB — POCT I-STAT, CHEM 8
BUN: 10 mg/dL (ref 6–20)
BUN: 9 mg/dL (ref 6–20)
BUN: 9 mg/dL (ref 6–20)
CALCIUM ION: 1.2 mmol/L (ref 1.13–1.30)
CALCIUM ION: 1.17 mmol/L (ref 1.13–1.30)
CHLORIDE: 101 mmol/L (ref 101–111)
CREATININE: 0.5 mg/dL — AB (ref 0.61–1.24)
Calcium, Ion: 1.16 mmol/L (ref 1.13–1.30)
Chloride: 98 mmol/L — ABNORMAL LOW (ref 101–111)
Chloride: 98 mmol/L — ABNORMAL LOW (ref 101–111)
Creatinine, Ser: 0.5 mg/dL — ABNORMAL LOW (ref 0.61–1.24)
Creatinine, Ser: 0.5 mg/dL — ABNORMAL LOW (ref 0.61–1.24)
GLUCOSE: 101 mg/dL — AB (ref 65–99)
GLUCOSE: 157 mg/dL — AB (ref 65–99)
GLUCOSE: 185 mg/dL — AB (ref 65–99)
HCT: 33 % — ABNORMAL LOW (ref 39.0–52.0)
HCT: 34 % — ABNORMAL LOW (ref 39.0–52.0)
HEMATOCRIT: 33 % — AB (ref 39.0–52.0)
HEMOGLOBIN: 11.2 g/dL — AB (ref 13.0–17.0)
HEMOGLOBIN: 11.2 g/dL — AB (ref 13.0–17.0)
HEMOGLOBIN: 11.6 g/dL — AB (ref 13.0–17.0)
POTASSIUM: 3.8 mmol/L (ref 3.5–5.1)
POTASSIUM: 3.8 mmol/L (ref 3.5–5.1)
Potassium: 4.1 mmol/L (ref 3.5–5.1)
SODIUM: 143 mmol/L (ref 135–145)
Sodium: 140 mmol/L (ref 135–145)
Sodium: 141 mmol/L (ref 135–145)
TCO2: 34 mmol/L (ref 0–100)
TCO2: 31 mmol/L (ref 0–100)
TCO2: 32 mmol/L (ref 0–100)

## 2015-06-26 LAB — PROTIME-INR
INR: 1.39 (ref 0.00–1.49)
PROTHROMBIN TIME: 17.2 s — AB (ref 11.6–15.2)

## 2015-06-26 LAB — GLUCOSE, CAPILLARY
GLUCOSE-CAPILLARY: 150 mg/dL — AB (ref 65–99)
GLUCOSE-CAPILLARY: 122 mg/dL — AB (ref 65–99)
Glucose-Capillary: 172 mg/dL — ABNORMAL HIGH (ref 65–99)

## 2015-06-26 LAB — POCT I-STAT 4, (NA,K, GLUC, HGB,HCT)
Glucose, Bld: 177 mg/dL — ABNORMAL HIGH (ref 65–99)
HEMATOCRIT: 34 % — AB (ref 39.0–52.0)
HEMOGLOBIN: 11.6 g/dL — AB (ref 13.0–17.0)
Potassium: 3.9 mmol/L (ref 3.5–5.1)
Sodium: 143 mmol/L (ref 135–145)

## 2015-06-26 LAB — PREPARE RBC (CROSSMATCH)

## 2015-06-26 LAB — APTT: aPTT: 25 seconds (ref 24–37)

## 2015-06-26 SURGERY — IMPLANTATION, AORTIC VALVE, TRANSCATHETER, FEMORAL APPROACH
Anesthesia: General | Site: Groin

## 2015-06-26 MED ORDER — SODIUM CHLORIDE 0.9 % IV SOLN: 1.0000 mL/kg/h | INTRAVENOUS | Status: DC

## 2015-06-26 MED ORDER — SUCCINYLCHOLINE CHLORIDE 20 MG/ML IJ SOLN
INTRAMUSCULAR | Status: AC
  Filled 2015-06-26: qty 1

## 2015-06-26 MED ORDER — HEPARIN SODIUM (PORCINE) 1000 UNIT/ML IJ SOLN
INTRAMUSCULAR | Status: DC | PRN
  Administered 2015-06-26: 20000 [IU] via INTRAVENOUS

## 2015-06-26 MED ORDER — ROCURONIUM BROMIDE 100 MG/10ML IV SOLN
INTRAVENOUS | Status: DC | PRN
  Administered 2015-06-26: 50 mg via INTRAVENOUS

## 2015-06-26 MED ORDER — VANCOMYCIN HCL IN DEXTROSE 1-5 GM/200ML-% IV SOLN
1000.0000 mg | Freq: Once | INTRAVENOUS | Status: AC
  Administered 2015-06-26: 1000 mg via INTRAVENOUS
  Filled 2015-06-26: qty 200

## 2015-06-26 MED ORDER — IODIXANOL 320 MG/ML IV SOLN
INTRAVENOUS | Status: DC | PRN
  Administered 2015-06-26: 40.1 mg via INTRAVENOUS

## 2015-06-26 MED ORDER — INSULIN ASPART 100 UNIT/ML ~~LOC~~ SOLN
0.0000 [IU] | SUBCUTANEOUS | Status: DC
  Administered 2015-06-26: 3 [IU] via SUBCUTANEOUS
  Administered 2015-06-26 (×2): 2 [IU] via SUBCUTANEOUS

## 2015-06-26 MED ORDER — PROTAMINE SULFATE 10 MG/ML IV SOLN
INTRAVENOUS | Status: DC | PRN
  Administered 2015-06-26 (×4): 50 mg via INTRAVENOUS

## 2015-06-26 MED ORDER — SODIUM CHLORIDE 0.9 % IJ SOLN: 3.0000 mL | INTRAMUSCULAR | Status: DC | PRN

## 2015-06-26 MED ORDER — DIPHENHYDRAMINE HCL 50 MG/ML IJ SOLN
INTRAMUSCULAR | Status: AC
  Filled 2015-06-26: qty 1

## 2015-06-26 MED ORDER — PANTOPRAZOLE SODIUM 40 MG PO TBEC
40.0000 mg | DELAYED_RELEASE_TABLET | Freq: Every day | ORAL | Status: DC
  Administered 2015-06-27 – 2015-06-28 (×2): 40 mg via ORAL
  Filled 2015-06-26 (×2): qty 1

## 2015-06-26 MED ORDER — EPINEPHRINE HCL 0.1 MG/ML IJ SOSY
PREFILLED_SYRINGE | INTRAMUSCULAR | Status: AC
  Filled 2015-06-26: qty 10

## 2015-06-26 MED ORDER — 0.9 % SODIUM CHLORIDE (POUR BTL) OPTIME
TOPICAL | Status: DC | PRN
  Administered 2015-06-26: 4000 mL

## 2015-06-26 MED ORDER — PROPOFOL 10 MG/ML IV BOLUS
INTRAVENOUS | Status: DC | PRN
  Administered 2015-06-26: 200 mg via INTRAVENOUS

## 2015-06-26 MED ORDER — SODIUM CHLORIDE 0.9 % IV SOLN
INTRAVENOUS | Status: DC
  Filled 2015-06-26: qty 2.5

## 2015-06-26 MED ORDER — OXYCODONE HCL 5 MG PO TABS: 5.0000 mg | ORAL_TABLET | ORAL | Status: DC | PRN

## 2015-06-26 MED ORDER — ONDANSETRON HCL 4 MG/2ML IJ SOLN: 4.0000 mg | Freq: Four times a day (QID) | INTRAMUSCULAR | Status: DC | PRN

## 2015-06-26 MED ORDER — METHYLPREDNISOLONE SODIUM SUCC 125 MG IJ SOLR
INTRAMUSCULAR | Status: AC
  Filled 2015-06-26: qty 2

## 2015-06-26 MED ORDER — NOREPINEPHRINE BITARTRATE 1 MG/ML IV SOLN
4000.0000 ug | INTRAVENOUS | Status: DC | PRN
  Administered 2015-06-26: 3 ug/min via INTRAVENOUS

## 2015-06-26 MED ORDER — SUCCINYLCHOLINE CHLORIDE 20 MG/ML IJ SOLN
INTRAMUSCULAR | Status: DC | PRN
  Administered 2015-06-26: 120 mg via INTRAVENOUS

## 2015-06-26 MED ORDER — ACETAMINOPHEN 500 MG PO TABS
1000.0000 mg | ORAL_TABLET | Freq: Four times a day (QID) | ORAL | Status: DC
  Administered 2015-06-27 – 2015-06-28 (×6): 1000 mg via ORAL
  Filled 2015-06-26 (×6): qty 2

## 2015-06-26 MED ORDER — ACETAMINOPHEN 160 MG/5ML PO SOLN: 650.0000 mg | Freq: Once | ORAL | Status: AC

## 2015-06-26 MED ORDER — NITROGLYCERIN IN D5W 200-5 MCG/ML-% IV SOLN: 0.0000 ug/min | INTRAVENOUS | Status: DC

## 2015-06-26 MED ORDER — BUDESONIDE-FORMOTEROL FUMARATE 160-4.5 MCG/ACT IN AERO
2.0000 | INHALATION_SPRAY | Freq: Two times a day (BID) | RESPIRATORY_TRACT | Status: DC
  Administered 2015-06-26 – 2015-06-28 (×4): 2 via RESPIRATORY_TRACT
  Filled 2015-06-26 (×2): qty 6

## 2015-06-26 MED ORDER — DEXMEDETOMIDINE HCL IN NACL 200 MCG/50ML IV SOLN
0.1000 ug/kg/h | INTRAVENOUS | Status: DC
  Administered 2015-06-26: 0.1 ug/kg/h via INTRAVENOUS
  Filled 2015-06-26: qty 50

## 2015-06-26 MED ORDER — CHLORHEXIDINE GLUCONATE 0.12 % MT SOLN
15.0000 mL | OROMUCOSAL | Status: AC
  Administered 2015-06-26: 15 mL via OROMUCOSAL

## 2015-06-26 MED ORDER — LACTATED RINGERS IV SOLN
INTRAVENOUS | Status: DC | PRN
  Administered 2015-06-26 (×3): via INTRAVENOUS

## 2015-06-26 MED ORDER — MIDAZOLAM HCL 2 MG/2ML IJ SOLN: 2.0000 mg | INTRAMUSCULAR | Status: DC | PRN

## 2015-06-26 MED ORDER — SODIUM CHLORIDE 0.9 % IV SOLN: 250.0000 mL | INTRAVENOUS | Status: DC | PRN

## 2015-06-26 MED ORDER — MIDAZOLAM HCL 2 MG/2ML IJ SOLN
INTRAMUSCULAR | Status: AC
  Filled 2015-06-26: qty 2

## 2015-06-26 MED ORDER — ROCURONIUM BROMIDE 50 MG/5ML IV SOLN
INTRAVENOUS | Status: AC
  Filled 2015-06-26: qty 1

## 2015-06-26 MED ORDER — DEXTROSE 5 % IV SOLN
1.5000 g | Freq: Two times a day (BID) | INTRAVENOUS | Status: AC
  Administered 2015-06-26 – 2015-06-28 (×4): 1.5 g via INTRAVENOUS
  Filled 2015-06-26 (×5): qty 1.5

## 2015-06-26 MED ORDER — MORPHINE SULFATE (PF) 2 MG/ML IV SOLN: 2.0000 mg | INTRAVENOUS | Status: DC | PRN

## 2015-06-26 MED ORDER — FENTANYL CITRATE (PF) 250 MCG/5ML IJ SOLN
INTRAMUSCULAR | Status: AC
  Filled 2015-06-26: qty 5

## 2015-06-26 MED ORDER — ASPIRIN EC 81 MG PO TBEC
81.0000 mg | DELAYED_RELEASE_TABLET | Freq: Every day | ORAL | Status: DC
  Administered 2015-06-27 – 2015-06-28 (×2): 81 mg via ORAL
  Filled 2015-06-26 (×2): qty 1

## 2015-06-26 MED ORDER — METOPROLOL TARTRATE 1 MG/ML IV SOLN: 2.5000 mg | INTRAVENOUS | Status: DC | PRN

## 2015-06-26 MED ORDER — HEPARIN SODIUM (PORCINE) 1000 UNIT/ML IJ SOLN
INTRAMUSCULAR | Status: AC
Start: 2015-06-26 — End: 2015-06-26
  Filled 2015-06-26: qty 1

## 2015-06-26 MED ORDER — SODIUM CHLORIDE 0.9 % IV SOLN
INTRAVENOUS | Status: DC | PRN
  Administered 2015-06-26: 1000 mL

## 2015-06-26 MED ORDER — OXYCODONE HCL 5 MG/5ML PO SOLN: 5.0000 mg | Freq: Once | ORAL | Status: DC | PRN

## 2015-06-26 MED ORDER — PROPOFOL 10 MG/ML IV BOLUS
INTRAVENOUS | Status: AC
  Filled 2015-06-26: qty 20

## 2015-06-26 MED ORDER — TRAMADOL HCL 50 MG PO TABS: 50.0000 mg | ORAL_TABLET | ORAL | Status: DC | PRN

## 2015-06-26 MED ORDER — ACETAMINOPHEN 160 MG/5ML PO SOLN: 1000.0000 mg | Freq: Four times a day (QID) | ORAL | Status: DC

## 2015-06-26 MED ORDER — GUAIFENESIN ER 600 MG PO TB12
1200.0000 mg | ORAL_TABLET | Freq: Two times a day (BID) | ORAL | Status: DC
  Administered 2015-06-26 – 2015-06-28 (×4): 1200 mg via ORAL
  Filled 2015-06-26 (×6): qty 2

## 2015-06-26 MED ORDER — LACTATED RINGERS IV SOLN
500.0000 mL | Freq: Once | INTRAVENOUS | Status: DC | PRN
Start: 2015-06-26 — End: 2015-06-27

## 2015-06-26 MED ORDER — FENTANYL CITRATE (PF) 100 MCG/2ML IJ SOLN
INTRAMUSCULAR | Status: DC | PRN
  Administered 2015-06-26 (×3): 50 ug via INTRAVENOUS
  Administered 2015-06-26: 100 ug via INTRAVENOUS

## 2015-06-26 MED ORDER — SODIUM CHLORIDE 0.9 % IJ SOLN: 3.0000 mL | Freq: Two times a day (BID) | INTRAMUSCULAR | Status: DC

## 2015-06-26 MED ORDER — ONDANSETRON HCL 4 MG/2ML IJ SOLN
INTRAMUSCULAR | Status: AC
  Filled 2015-06-26: qty 2

## 2015-06-26 MED ORDER — METOPROLOL TARTRATE 12.5 MG HALF TABLET
12.5000 mg | ORAL_TABLET | Freq: Two times a day (BID) | ORAL | Status: DC
  Administered 2015-06-27 – 2015-06-28 (×3): 12.5 mg via ORAL
  Filled 2015-06-26 (×3): qty 1

## 2015-06-26 MED ORDER — ACETAMINOPHEN 650 MG RE SUPP
650.0000 mg | Freq: Once | RECTAL | Status: AC
  Administered 2015-06-26: 650 mg via RECTAL

## 2015-06-26 MED ORDER — ALBUTEROL SULFATE HFA 108 (90 BASE) MCG/ACT IN AERS
INHALATION_SPRAY | RESPIRATORY_TRACT | Status: DC | PRN
  Administered 2015-06-26: 2 via RESPIRATORY_TRACT

## 2015-06-26 MED ORDER — FENTANYL CITRATE (PF) 100 MCG/2ML IJ SOLN: 25.0000 ug | INTRAMUSCULAR | Status: DC | PRN

## 2015-06-26 MED ORDER — POTASSIUM CHLORIDE 10 MEQ/50ML IV SOLN
10.0000 meq | INTRAVENOUS | Status: AC
  Administered 2015-06-26 (×3): 10 meq via INTRAVENOUS

## 2015-06-26 MED ORDER — DEXTROSE 5 % IV SOLN
10.0000 mg | INTRAVENOUS | Status: DC | PRN
  Administered 2015-06-26: 50 ug/min via INTRAVENOUS

## 2015-06-26 MED ORDER — INSULIN REGULAR BOLUS VIA INFUSION
0.0000 [IU] | Freq: Three times a day (TID) | INTRAVENOUS | Status: DC
  Filled 2015-06-26: qty 10

## 2015-06-26 MED ORDER — STERILE WATER FOR INJECTION IJ SOLN
INTRAMUSCULAR | Status: AC
  Filled 2015-06-26: qty 10

## 2015-06-26 MED ORDER — PHENYLEPHRINE HCL 10 MG/ML IJ SOLN
0.0000 ug/min | INTRAVENOUS | Status: DC
  Filled 2015-06-26: qty 2

## 2015-06-26 MED ORDER — METOPROLOL TARTRATE 25 MG/10 ML ORAL SUSPENSION: 12.5000 mg | Freq: Two times a day (BID) | ORAL | Status: DC

## 2015-06-26 MED ORDER — EPHEDRINE SULFATE 50 MG/ML IJ SOLN
INTRAMUSCULAR | Status: AC
  Filled 2015-06-26: qty 1

## 2015-06-26 MED ORDER — MORPHINE SULFATE (PF) 2 MG/ML IV SOLN: 1.0000 mg | INTRAVENOUS | Status: AC | PRN

## 2015-06-26 MED ORDER — FAMOTIDINE IN NACL 20-0.9 MG/50ML-% IV SOLN
20.0000 mg | Freq: Two times a day (BID) | INTRAVENOUS | Status: AC
  Administered 2015-06-26: 20 mg via INTRAVENOUS

## 2015-06-26 MED ORDER — SODIUM CHLORIDE 0.9 % IV SOLN
INTRAVENOUS | Status: DC | PRN
  Administered 2015-06-26: 500 mL

## 2015-06-26 MED ORDER — ARTIFICIAL TEARS OP OINT
TOPICAL_OINTMENT | OPHTHALMIC | Status: AC
  Filled 2015-06-26: qty 3.5

## 2015-06-26 MED ORDER — CLOPIDOGREL BISULFATE 75 MG PO TABS
75.0000 mg | ORAL_TABLET | Freq: Every day | ORAL | Status: DC
  Administered 2015-06-27 – 2015-06-28 (×2): 75 mg via ORAL
  Filled 2015-06-26 (×3): qty 1

## 2015-06-26 MED ORDER — TIOTROPIUM BROMIDE MONOHYDRATE 18 MCG IN CAPS
18.0000 ug | ORAL_CAPSULE | Freq: Every day | RESPIRATORY_TRACT | Status: DC
  Administered 2015-06-27: 18 ug via RESPIRATORY_TRACT
  Filled 2015-06-26: qty 5

## 2015-06-26 MED ORDER — LIDOCAINE HCL (CARDIAC) 20 MG/ML IV SOLN
INTRAVENOUS | Status: AC
  Filled 2015-06-26: qty 5

## 2015-06-26 MED ORDER — HEPARIN SODIUM (PORCINE) 1000 UNIT/ML IJ SOLN
INTRAMUSCULAR | Status: AC
  Filled 2015-06-26: qty 1

## 2015-06-26 MED ORDER — OXYCODONE HCL 5 MG PO TABS: 5.0000 mg | ORAL_TABLET | Freq: Once | ORAL | Status: DC | PRN

## 2015-06-26 MED ORDER — ALBUMIN HUMAN 5 % IV SOLN: 250.0000 mL | INTRAVENOUS | Status: DC | PRN

## 2015-06-26 MED ORDER — MIDAZOLAM HCL 2 MG/2ML IJ SOLN
INTRAMUSCULAR | Status: DC | PRN
  Administered 2015-06-26: 2 mg via INTRAVENOUS

## 2015-06-26 MED ORDER — LIDOCAINE HCL (CARDIAC) 20 MG/ML IV SOLN
INTRAVENOUS | Status: DC | PRN
  Administered 2015-06-26: 100 mg via INTRAVENOUS
  Administered 2015-06-26: 40 mg via INTRAVENOUS

## 2015-06-26 MED ORDER — ALBUTEROL SULFATE (2.5 MG/3ML) 0.083% IN NEBU
3.0000 mL | INHALATION_SOLUTION | RESPIRATORY_TRACT | Status: DC | PRN
Start: 2015-06-26 — End: 2015-06-28
  Administered 2015-06-27: 3 mL via RESPIRATORY_TRACT
  Filled 2015-06-26: qty 3

## 2015-06-26 MED FILL — Magnesium Sulfate Inj 50%: INTRAMUSCULAR | Qty: 10 | Status: AC

## 2015-06-26 MED FILL — Sodium Chloride IV Soln 0.9%: INTRAVENOUS | Qty: 2000 | Status: AC

## 2015-06-26 MED FILL — Heparin Sodium (Porcine) Inj 1000 Unit/ML: INTRAMUSCULAR | Qty: 30 | Status: AC

## 2015-06-26 MED FILL — Electrolyte-R (PH 7.4) Solution: INTRAVENOUS | Qty: 3000 | Status: AC

## 2015-06-26 MED FILL — Potassium Chloride Inj 2 mEq/ML: INTRAVENOUS | Qty: 40 | Status: AC

## 2015-06-26 SURGICAL SUPPLY — 118 items
ADAPTER UNIV SWAN GANZ BIP (ADAPTER) ×1 IMPLANT
ADAPTER UNV SWAN GANZ BIP (ADAPTER) ×2
ADH SKN CLS APL DERMABOND .7 (GAUZE/BANDAGES/DRESSINGS) ×2
ADPR CATH UNV NS SG CATH (ADAPTER) ×2
ATTRACTOMAT 16X20 MAGNETIC DRP (DRAPES) IMPLANT
BAG BANDED W/RUBBER/TAPE 36X54 (MISCELLANEOUS) ×4 IMPLANT
BAG DECANTER FOR FLEXI CONT (MISCELLANEOUS) IMPLANT
BAG EQP BAND 135X91 W/RBR TAPE (MISCELLANEOUS) ×2
BAG SNAP BAND KOVER 36X36 (MISCELLANEOUS) ×11 IMPLANT
BLADE 10 SAFETY STRL DISP (BLADE) ×4 IMPLANT
BLADE STERNUM SYSTEM 6 (BLADE) ×4 IMPLANT
BLADE SURG ROTATE 9660 (MISCELLANEOUS) IMPLANT
BOOT SUTURE AID YELLOW STND (SUTURE) ×3 IMPLANT
CABLE PACING FASLOC BIEGE (MISCELLANEOUS) IMPLANT
CABLE PACING FASLOC BLUE (MISCELLANEOUS) ×6 IMPLANT
CANISTER SUCTION 2500CC (MISCELLANEOUS) IMPLANT
CANNULA FEM VENOUS REMOTE 22FR (CANNULA) IMPLANT
CANNULA OPTISITE PERFUSION 16F (CANNULA) IMPLANT
CANNULA OPTISITE PERFUSION 18F (CANNULA) IMPLANT
CATH ANGIO 5F BER2 65CM (CATHETERS) ×2 IMPLANT
CATH DIAG EXPO 6F AL2 (CATHETERS) ×2 IMPLANT
CATH DIAG EXPO 6F VENT PIG 145 (CATHETERS) ×4 IMPLANT
CATH S G BIP PACING (SET/KITS/TRAYS/PACK) ×6 IMPLANT
CATH SOFT-VU 4F 65 STRAIGHT (CATHETERS) IMPLANT
CATH SOFT-VU STRAIGHT 4F 65CM (CATHETERS) ×4
CLIP TI MEDIUM 24 (CLIP) ×4 IMPLANT
CLIP TI WIDE RED SMALL 24 (CLIP) ×4 IMPLANT
CONT SPEC 4OZ CLIKSEAL STRL BL (MISCELLANEOUS) ×6 IMPLANT
COVER DOME SNAP 22 D (MISCELLANEOUS) ×4 IMPLANT
COVER MAYO STAND STRL (DRAPES) ×4 IMPLANT
COVER TABLE BACK 60X90 (DRAPES) ×4 IMPLANT
CRADLE DONUT ADULT HEAD (MISCELLANEOUS) ×4 IMPLANT
DERMABOND ADVANCED (GAUZE/BANDAGES/DRESSINGS) ×2
DERMABOND ADVANCED .7 DNX12 (GAUZE/BANDAGES/DRESSINGS) ×2 IMPLANT
DEVICE CLOSURE PERCLS PRGLD 6F (VASCULAR PRODUCTS) IMPLANT
DRAPE CV SPLIT W-CLR ANES SCRN (DRAPES) ×3 IMPLANT
DRAPE INCISE IOBAN 66X45 STRL (DRAPES) ×2 IMPLANT
DRAPE SLUSH MACHINE 52X66 (DRAPES) ×4 IMPLANT
DRAPE TABLE COVER HEAVY DUTY (DRAPES) ×4 IMPLANT
DRSG TEGADERM 4X4.75 (GAUZE/BANDAGES/DRESSINGS) ×4 IMPLANT
ELECT REM PT RETURN 9FT ADLT (ELECTROSURGICAL) ×8
ELECTRODE REM PT RTRN 9FT ADLT (ELECTROSURGICAL) ×4 IMPLANT
FELT TEFLON 6X6 (MISCELLANEOUS) ×4 IMPLANT
FEMORAL VENOUS CANN RAP (CANNULA) IMPLANT
GAUZE SPONGE 4X4 12PLY STRL (GAUZE/BANDAGES/DRESSINGS) ×4 IMPLANT
GLOVE BIO SURGEON STRL SZ 6.5 (GLOVE) ×2 IMPLANT
GLOVE BIO SURGEON STRL SZ7.5 (GLOVE) ×4 IMPLANT
GLOVE BIO SURGEON STRL SZ8 (GLOVE) ×8 IMPLANT
GLOVE BIO SURGEONS STRL SZ 6.5 (GLOVE) ×2
GLOVE BIOGEL PI IND STRL 6.5 (GLOVE) ×4 IMPLANT
GLOVE BIOGEL PI INDICATOR 6.5 (GLOVE) ×8
GLOVE EUDERMIC 7 POWDERFREE (GLOVE) ×4 IMPLANT
GLOVE ORTHO TXT STRL SZ7.5 (GLOVE) ×4 IMPLANT
GLOVE SURG SS PI 6.5 STRL IVOR (GLOVE) ×2 IMPLANT
GOWN STRL REUS W/ TWL LRG LVL3 (GOWN DISPOSABLE) ×6 IMPLANT
GOWN STRL REUS W/ TWL XL LVL3 (GOWN DISPOSABLE) ×14 IMPLANT
GOWN STRL REUS W/TWL LRG LVL3 (GOWN DISPOSABLE) ×12
GOWN STRL REUS W/TWL XL LVL3 (GOWN DISPOSABLE) ×32
GUIDEWIRE SAF TJ AMPL .035X180 (WIRE) ×4 IMPLANT
GUIDEWIRE SAFE TJ AMPLATZ EXST (WIRE) ×2 IMPLANT
GUIDEWIRE STRAIGHT .035 260CM (WIRE) ×3 IMPLANT
GUIDEWIRE WHOLEY .035 145 JTIP (WIRE) ×3 IMPLANT
INSERT FOGARTY 61MM (MISCELLANEOUS) ×4 IMPLANT
INSERT FOGARTY SM (MISCELLANEOUS) ×8 IMPLANT
INSERT FOGARTY XLG (MISCELLANEOUS) IMPLANT
KIT BASIN OR (CUSTOM PROCEDURE TRAY) ×4 IMPLANT
KIT DILATOR VASC 18G NDL (KITS) IMPLANT
KIT HEART LEFT (KITS) ×3 IMPLANT
KIT ROOM TURNOVER OR (KITS) ×4 IMPLANT
KIT SUCTION CATH 14FR (SUCTIONS) ×4 IMPLANT
NDL PERC 18GX7CM (NEEDLE) ×1 IMPLANT
NEEDLE PERC 18GX7CM (NEEDLE) ×4 IMPLANT
NS IRRIG 1000ML POUR BTL (IV SOLUTION) ×18 IMPLANT
PACK AORTA (CUSTOM PROCEDURE TRAY) ×4 IMPLANT
PAD ARMBOARD 7.5X6 YLW CONV (MISCELLANEOUS) ×8 IMPLANT
PAD ELECT DEFIB RADIOL ZOLL (MISCELLANEOUS) ×4 IMPLANT
PATCH TACHOSII LRG 9.5X4.8 (VASCULAR PRODUCTS) IMPLANT
PERCLOSE PROGLIDE 6F (VASCULAR PRODUCTS) ×8
SET CARDIOPLEGIA MPS 5001102 (MISCELLANEOUS) ×2 IMPLANT
SET MICROPUNCTURE 5F STIFF (MISCELLANEOUS) ×2 IMPLANT
SHEATH AVANTI 11CM 8FR (MISCELLANEOUS) ×2 IMPLANT
SHEATH PINNACLE 6F 10CM (SHEATH) ×12 IMPLANT
SLEEVE REPOSITIONING LENGTH 30 (MISCELLANEOUS) ×3 IMPLANT
SPONGE GAUZE 4X4 12PLY STER LF (GAUZE/BANDAGES/DRESSINGS) ×4 IMPLANT
SPONGE LAP 4X18 X RAY DECT (DISPOSABLE) ×2 IMPLANT
STOPCOCK MORSE 400PSI 3WAY (MISCELLANEOUS) ×6 IMPLANT
SUT ETHIBOND X763 2 0 SH 1 (SUTURE) ×1 IMPLANT
SUT GORETEX CV 4 TH 22 36 (SUTURE) ×4 IMPLANT
SUT GORETEX CV4 TH-18 (SUTURE) ×12 IMPLANT
SUT GORETEX TH-18 36 INCH (SUTURE) ×8 IMPLANT
SUT MNCRL AB 3-0 PS2 18 (SUTURE) ×1 IMPLANT
SUT PROLENE 3 0 SH1 36 (SUTURE) IMPLANT
SUT PROLENE 4 0 RB 1 (SUTURE)
SUT PROLENE 4-0 RB1 .5 CRCL 36 (SUTURE) ×2 IMPLANT
SUT PROLENE 5 0 C 1 36 (SUTURE) ×2 IMPLANT
SUT PROLENE 6 0 C 1 30 (SUTURE) ×2 IMPLANT
SUT SILK  1 MH (SUTURE) ×2
SUT SILK 1 MH (SUTURE) ×2 IMPLANT
SUT SILK 2 0 SH CR/8 (SUTURE) IMPLANT
SUT VIC AB 2-0 CT1 27 (SUTURE) ×4
SUT VIC AB 2-0 CT1 TAPERPNT 27 (SUTURE) ×2 IMPLANT
SUT VIC AB 2-0 CTX 36 (SUTURE) IMPLANT
SUT VIC AB 3-0 SH 8-18 (SUTURE) ×2 IMPLANT
SYR 30ML LL (SYRINGE) ×8 IMPLANT
SYR 50ML LL SCALE MARK (SYRINGE) ×4 IMPLANT
SYR MEDRAD MARK V 150ML (SYRINGE) ×3 IMPLANT
TAPE CLOTH SURG 4X10 WHT LF (GAUZE/BANDAGES/DRESSINGS) ×3 IMPLANT
TOWEL OR 17X26 10 PK STRL BLUE (TOWEL DISPOSABLE) ×8 IMPLANT
TRANSDUCER W/STOPCOCK (MISCELLANEOUS) ×4 IMPLANT
TRAY FOLEY IC TEMP SENS 14FR (CATHETERS) ×4 IMPLANT
TUBE SUCT INTRACARD DLP 20F (MISCELLANEOUS) IMPLANT
TUBING ART PRESS 72  MALE/FEM (TUBING) ×2
TUBING ART PRESS 72 MALE/FEM (TUBING) ×1 IMPLANT
TUBING HIGH PRESSURE 120CM (CONNECTOR) ×4 IMPLANT
VALVE HEART TRANSCATH SZ3 26MM (Prosthesis & Implant Heart) ×3 IMPLANT
WIRE .035 3MM-J 145CM (WIRE) ×3 IMPLANT
WIRE AMPLATZ SS-J .035X180CM (WIRE) ×3 IMPLANT
WIRE BENTSON .035X145CM (WIRE) ×2 IMPLANT

## 2015-06-26 NOTE — Procedures (Signed)
Extubation Procedure Note  Patient Details:   Name: [name] [name] DOB: 1933-06-27 MRN: [id number]   Airway Documentation:     Evaluation  O2 sats: stable throughout Complications: No apparent complications Patient did tolerate procedure well. Bilateral Breath Sounds: Clear, Diminished Suctioning: Oral, Airway Yes   Patient was extubated to a 4L [place]. Cuff leak was heard. No stridor was heard. RN was at bedside with RT during extubation. Patient was able to talk. RT will continue to monitor.  Tiburcio Bash 06/26/2015, 5:39 PM

## 2015-06-26 NOTE — Op Note (Signed)
[facility]   MULTIDISCIPLINARY HEART VALVE TEAM   TAVR OPERATIVE NOTE Date of Procedure:  06/26/2015  Preoperative Diagnosis: Severe Aortic Stenosis   Postoperative Diagnosis: Same   Procedure:    Transcatheter Aortic Valve Replacement - Percutaneous Right Transfemoral Approach  Edwards Sapien 3 THV (size 26 mm, model # 9600TFX, serial # [id number])   Co-Surgeons:  Gilford Raid, MD and [name], MD  Assistants:   Darylene Price, MD  Anesthesiologist:  Albertha Ghee, MD  Echocardiographer:  Ena Dawley, MD  Pre-operative Echo Findings:  Severe aortic stenosis  Normal left ventricular systolic function  Post-operative Echo Findings:  Trivial paravalvular leak  Normal left ventricular systolic function  BRIEF CLINICAL NOTE AND INDICATIONS FOR SURGERY  Patient is a 82 year old obese male with history severe COPD on home oxygen therapy, obstructive sleep apnea on CPAP, and severely limited physical mobility who has been recently found to have severe symptomatic aortic stenosis and has been referred for a second surgical consultation to discuss treatment options further. The patient states that he has known he has had a heart murmur for several years. He was followed for years by Dr. [name] because of severe COPD and obstructive sleep apnea. More recently the patient has been followed by Dr. [name]. He has experienced significant progression of symptoms of exertional shortness of breath and was noted to have a prominent systolic murmur on physical exam. Echocardiogram confirmed the presence of severe aortic stenosis with peak velocity across the aortic valve measured 4.6 m/s corresponding to mean transvalvular gradient estimated 51 mmHg. Left ventricular systolic function remains preserved with ejection fraction estimated 55-60%. The patient was referred for cardiology consultation and underwent left and right heart catheterization 05/15/2015. Catheterization  confirmed the presence of severe aortic stenosis with mean transvalvular gradient measured 44 mmHg at catheterization. There was mild pulmonary hypertension. 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 Cardiac gated CT angiogram of the heart confirmed similar coronary anatomy as well as the presence of severe aortic stenosis with anatomical characteristics suitable for transcatheter aortic valve replacement without any significant complicating features. CT angiogram of the abdomen and pelvis revealed the presence of a small infrarenal abdominal aortic aneurysm but adequate pelvic vascular access to facilitate a transfemoral approach. The patient was seen in consultation by Dr. [name] and felt to be a relatively poor candidate for conventional surgical aortic valve replacement. Transcatheter aortic valve replacement has been recommended  During the course of the patient's preoperative work up they have been evaluated comprehensively by a multidisciplinary team of specialists coordinated through the [facility] in the [facility].  They have been demonstrated to suffer from symptomatic severe aortic stenosis as noted above. The patient has been counseled extensively as to the relative risks and benefits of all options for the treatment of severe aortic stenosis including long term medical therapy, conventional surgery for aortic valve replacement, and transcatheter aortic valve replacement.  The patient has been independently evaluated by two cardiac surgeons including Dr [name] and Dr Roxy Manns, and they are felt to be at high risk for conventional surgical aortic valve replacement based upon a predicted risk of mortality using the [employer] of [employer] risk calculator of 3.4%. Both surgeons indicated the patient would be a poor candidate for  conventional surgery (predicted risk of mortality >15% and/or predicted risk of permanent morbidity >50%) because of comorbidities including severe O2-dependent  COPD.   Based upon review of all of the patient's preoperative diagnostic tests they are felt to be candidate for transcatheter aortic valve replacement using the transfemoral approach as an alternative to high risk conventional surgery.    Following the decision to proceed with transcatheter aortic valve replacement, a discussion has been held regarding what types of management strategies would be attempted intraoperatively in the event of life-threatening complications, including whether or not the patient would be considered a candidate for the use of cardiopulmonary bypass and/or conversion to open sternotomy for attempted surgical intervention.  The patient has been advised of a variety of complications that might develop peculiar to this approach including but not limited to risks of death, stroke, paravalvular leak, aortic dissection or other major vascular complications, aortic annulus rupture, device embolization, cardiac rupture or perforation, acute myocardial infarction, arrhythmia, heart block or bradycardia requiring permanent pacemaker placement, congestive heart failure, respiratory failure, renal failure, pneumonia, infection, other late complications related to structural valve deterioration or migration, or other complications that might ultimately cause a temporary or permanent loss of functional independence or other long term morbidity.  The patient provides full informed consent for the procedure as described and all questions were answered preoperatively.    DETAILS OF THE OPERATIVE PROCEDURE  PREPARATION:    The patient is brought to the operating room on the above mentioned date and central monitoring was established by the anesthesia team including placement of Swan-Ganz catheter and radial arterial line. The patient is  placed in the supine position on the operating table.  Intravenous antibiotics are administered. General endotracheal anesthesia is induced uneventfully.  A Foley catheter is placed.  Baseline transesophageal echocardiogram was performed. The patient's chest, abdomen, both groins, and both lower extremities are prepared and draped in a sterile manner. A time out procedure is performed.   PERIPHERAL ACCESS:    Using the modified Seldinger technique, femoral arterial and venous access was obtained with placement of 6 Fr sheaths on the left side.  A pigtail diagnostic catheter was passed through the left femoral arterial sheath under fluoroscopic guidance into the aortic root.  Initially, the left femoral arterial sheath went up and over the CFA bifurcation and pointed down the profunda. A wire and angled catheter was used to redirect the sheath in retrograde fashion but this was unsuccessful. Manual pressure was used for hemostasis. A second attempt at arterial access was successful. A temporary transvenous pacemaker catheter was passed through the left femoral venous sheath under fluoroscopic guidance into the right ventricle.  The pacemaker was tested to ensure stable lead placement and pacemaker capture. Aortic root angiography was performed in order to determine the optimal angiographic angle for valve deployment.   TRANSFEMORAL ACCESS:  Percutaneous transfemoral access and sheath placement was performed in the right femoral artery using standard technique with micropuncture access and deployment of 2 PerClose devices.  After Perclose deployment, an 8 Fr sheath was used to transit an Amplatz superstiff wire into the aorta and after predilatation a 14 Fr E-sheath was inserted without difficulty. The patient was heparinized systemically and ACT verified > 250 seconds.    An AL-2 catheter was used to direct a straight-tip exchange length wire across the native aortic valve into the left ventricle. This  was exchanged out for a pigtail catheter and position was confirmed in the LV apex. Simultaneous LV and Ao pressures were recorded.  The pigtail catheter was exchanged for an Amplatz Extra-stiff wire in the LV apex.  Echocardiography  was utilized to confirm appropriate wire position and no sign of entanglement in the mitral subvalvular apparatus.   BALLOON AORTIC VALVULOPLASTY:   Balloon aortic valvuloplasty was performed using a 23 mm valvuloplasty balloon.  Once optimal position was achieved, BAV was done under rapid ventricular pacing. The patient recovered well hemodynamically.    TRANSCATHETER HEART VALVE DEPLOYMENT:   An Edwards Sapien 3 transcatheter heart valve (size 26 mm, model #9600TFX) was prepared and crimped per manufacturer's guidelines, and the proper orientation of the valve is confirmed on the [employer] delivery system. The valve was advanced through the introducer sheath using normal technique until in an appropriate position in the abdominal aorta beyond the sheath tip. The balloon was then retracted and using the fine-tuning wheel was centered on the valve. The valve was then advanced across the aortic arch using appropriate flexion of the catheter. The valve was carefully positioned across the aortic valve annulus. The Commander catheter was retracted using normal technique. Once final position of the valve has been confirmed by angiographic assessment, the valve is deployed while temporarily holding ventilation and during rapid ventricular pacing to maintain systolic blood pressure < 50 mmHg and pulse pressure < 10 mmHg. The balloon inflation is held for >3 seconds after reaching full deployment volume. Once the balloon has fully deflated the balloon is retracted into the ascending aorta and valve function is assessed using echocardiography. There is felt to be mild paravalvular leak and no central aortic insufficiency.  The patient's hemodynamic recovery following valve  deployment is good.  Because of mild PVL, post-dilatation is performed with one addition cc of volume in the Atrion device. The deployment balloon and guidewire are both removed. Echo demostrated acceptable post-procedural gradients, stable mitral valve function, and trivial aortic insufficiency.   PROCEDURE COMPLETION:  The sheath was removed and femoral artery closure performed using the previously deployed Perclose devices.  Protamine was administered once femoral arterial repair was complete. The temporary pacemaker, pigtail catheters and femoral sheaths were removed with manual pressure used for hemostasis.   The patient tolerated the procedure well and is transported to the surgical intensive care in stable condition. There were no immediate intraoperative complications. All sponge instrument and needle counts are verified correct at completion of the operation.   No blood products were administered during the operation.  The patient received a total of 40 mL of intravenous contrast during the procedure.  [name], MD 06/26/2015 3:11 PM

## 2015-06-26 NOTE — Progress Notes (Signed)
CT surgery p.m. Rounds  Patient sitting on side of bed with nurses  Stable after percutaneous aVR No bleeding from groin access site Continue current care

## 2015-06-26 NOTE — Progress Notes (Signed)
  Echocardiogram Echocardiogram Transesophageal has been performed.  Darlina Sicilian M 06/26/2015, 11:06 AM

## 2015-06-26 NOTE — Op Note (Signed)
[facility]   MULTIDISCIPLINARY HEART VALVE TEAM   TAVR OPERATIVE NOTE   Date of Procedure:  06/26/2015  Preoperative Diagnosis: Severe Aortic Stenosis   Postoperative Diagnosis: Same   Procedure:     Transcatheter Aortic Valve Replacement - Percutaneous Right Transfemoral Approach  Edwards Sapien 3 THV (size 26 mm, model # 9600TFX, serial # [id number])   Co-Surgeons:  Gaye Pollack, MD and [name], MD  Assistants:   Darylene Price, MD  Anesthesiologist:  Albertha Ghee, MD  Echocardiographer:  Ena Dawley, MD  Pre-operative Echo Findings:   severe aortic stenosis  normal left ventricular systolic function   Post-operative Echo Findings:  trivial paravalvular leak  normal left ventricular systolic function  Brief Clinical Note and indications for surgery:  The patient is a 82 year old gentleman with a prior smoking history, severe COPD on home oxygen, OSA on CPAP who was evaluated by Dr. [name] for a 6-12 month history of worsening shortness of breath and fatigue with exertion. Dr. [name] noted a systolic murmur and an echo showed severe AS with a mean gradient of 51 mm Hg. The LVEF was normal. 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 The AV mean gradient was 44 mm Hg with a calculated AVA of 1 cm2.  I don't think he is a candidate for open surgical aortic valve replacement due to the severity of his COPD.  I think TAVR is the best option for him.  A discussion has been held regarding what types of management strategies would be attempted intraoperatively in the event of life-threatening complications, including whether or not the patient would be considered a candidate for the use of cardiopulmonary bypass and/or conversion to open sternotomy for  attempted surgical intervention. The patient has been advised of a variety of complications that might develop including but not limited to risks of death, stroke, paravalvular leak, aortic dissection or other major vascular complications, aortic annulus rupture, device embolization, cardiac rupture or perforation, mitral regurgitation, acute myocardial infarction, arrhythmia, heart block or bradycardia requiring permanent pacemaker placement, congestive heart failure, respiratory failure, renal failure, pneumonia, infection, other late complications related to structural valve deterioration or migration, or other complications that might ultimately cause a temporary or permanent loss of functional independence or other long term morbidity. The patient provides full informed consent for the procedure as described and all questions were answered.   DETAILS OF THE OPERATIVE PROCEDURE  PREPARATION:    The patient is brought to the operating room on the above mentioned date and central monitoring was established by the anesthesia team including placement of Swan-Ganz catheter and radial arterial line. The patient is placed in the supine position on the operating table.  Intravenous antibiotics are administered. General endotracheal anesthesia is induced uneventfully.  A Foley catheter is placed.  Baseline transesophageal echocardiogram was performed. The patient's chest, abdomen, both groins, and both lower extremities are prepared and draped in a sterile manner. A time out procedure is performed.    TRANSFEMORAL ACCESS:   Percutaneous transfemoral access and sheath placement was performed by Dr Burt Knack. Please see his separate operative note for details. The patient was heparinized systemically and ACT verified > 250 seconds.    A 14 Fr transfemoral E-sheath was introduced into the right femoral artery after progressively dilating  over an Amplatz superstiff wire. An AL-2 catheter was used to direct a  straight-tip exchange length wire across the native aortic valve into the left ventricle. This was exchanged out for a pigtail catheter and position was confirmed in the LV apex. Simultaneous LV and Ao pressures were recorded.  The pigtail catheter was exchanged for an Amplatz Extra-stiff wire in the LV apex.  Echocardiography was utilized to confirm appropriate wire position and no sign of entanglement in the mitral subvalvular apparatus.   BALLOON AORTIC VALVULOPLASTY:   Balloon aortic valvuloplasty was performed using a 23 mm valvuloplasty balloon.  Once optimal position was achieved, BAV was done under rapid ventricular pacing. The patient recovered well hemodynamically.    TRANSCATHETER HEART VALVE DEPLOYMENT:   An Edwards Sapien 3 transcatheter heart valve (size 26 mm, model #9600TFX, serial [id number]) was prepared and crimped per manufacturer's guidelines, and the proper orientation of the valve is confirmed on the [employer] delivery system. The valve was advanced through the introducer sheath using normal technique until in an appropriate position in the abdominal aorta beyond the sheath tip. The balloon was then retracted and using the fine-tuning wheel was centered on the valve. The valve was then advanced across the aortic arch using appropriate flexion of the catheter. The valve was carefully positioned across the aortic valve annulus. The Commander catheter was retracted using normal technique. Once final position of the valve has been confirmed by angiographic assessment, the valve is deployed while temporarily holding ventilation and during rapid ventricular pacing to maintain systolic blood pressure < 50 mmHg and pulse pressure < 10 mmHg. The balloon inflation is held for >3 seconds after reaching full deployment volume. Once the balloon has fully deflated the balloon is retracted into the ascending aorta and valve function is assessed using echocardiography. There is felt to be mild  paravalvular leak and no central aortic insufficiency. Because of mild PVL, post-dilatation is performed with one additional cc of volume in the Atrion device.  The patient's hemodynamic recovery following valve deployment is good.  The deployment balloon and guidewire are both removed. Echo demostrated acceptable post-procedural gradients, stable mitral valve function, and trivial aortic insufficiency.  PROCEDURE COMPLETION:   The sheath was removed and femoral artery closure performed by Dr Burt Knack. Please see his separate report for details.  Protamine was administered . The temporary pacemaker, pigtail catheters and femoral sheaths were removed with manual pressure used for hemostasis.   The patient tolerated the procedure well and is transported to the surgical intensive care in stable condition. There were no immediate intraoperative complications. All sponge instrument and needle counts are verified correct at completion of the operation.   No blood products were administered during the operation.  The patient received a total of 40 mL of intravenous contrast during the procedure.   Gaye Pollack, MD 06/26/2015

## 2015-06-26 NOTE — Anesthesia Procedure Notes (Addendum)
Central Venous Catheter Insertion Performed by: anesthesiologist 06/26/2015 7:15 AM Patient location: Pre-op. Preanesthetic checklist: patient identified, IV checked, site marked, risks and benefits discussed, surgical consent, monitors and equipment checked, pre-op evaluation, timeout performed and anesthesia consent Position: Trendelenburg Lidocaine 1% used for infiltration Landmarks identified and Seldinger technique used Catheter size: 8.5 Fr Central line was placed.Sheath introducer Swan type and PA catheter depth:thermodilation and 50PA Cath depth:50 Procedure performed using ultrasound guided technique. Attempts: 1 Following insertion, line sutured and dressing applied. Post procedure assessment: blood return through all ports, free fluid flow and no air. Patient tolerated the procedure well with no immediate complications.   Procedure Name: Intubation Date/Time: 06/26/2015 8:00 AM Performed by: [place] Pink Pre-anesthesia Checklist: Patient identified, Emergency Drugs available, Suction available, Patient being monitored and Timeout performed Patient Re-evaluated:Patient Re-evaluated prior to inductionOxygen Delivery Method: Circle system utilized Preoxygenation: Pre-oxygenation with 100% oxygen Intubation Type: IV induction Laryngoscope Size: Mac and 4 Grade View: Grade I Tube type: Subglottic suction tube Tube size: 7.5 mm Number of attempts: 1 Airway Equipment and Method: Stylet Placement Confirmation: ETT inserted through vocal cords under direct vision,  positive ETCO2 and breath sounds checked- equal and bilateral Secured at: 21 cm Tube secured with: Tape Dental Injury: Teeth and Oropharynx as per pre-operative assessment

## 2015-06-26 NOTE — Transfer of Care (Signed)
Immediate Anesthesia Transfer of Care Note  Patient: [name] [name]  Procedure(s) Performed: Procedure(s): TRANSCATHETER AORTIC VALVE REPLACEMENT, TRANSFEMORAL (N/A) TRANSESOPHAGEAL ECHOCARDIOGRAM (TEE) (N/A)  Patient Location: ICU  Anesthesia Type:General  Level of Consciousness: sedated  Airway & Oxygen Therapy: Patient remains intubated per anesthesia plan and Patient placed on Ventilator (see vital sign flow sheet for setting)  Post-op Assessment: Report given to RN and Post -op Vital signs reviewed and stable  Post vital signs: Reviewed and stable  Last Vitals:  Filed Vitals:   06/26/15 0632  BP: 134/65  Pulse: 87  Temp: 36.9 C  Resp: 20    Complications: No apparent anesthesia complications

## 2015-06-26 NOTE — Interval H&P Note (Signed)
History and Physical Interval Note:  06/26/2015 7:32 AM  [name] [name]  has presented today for surgery, with the diagnosis of SEVERE AS  The various methods of treatment have been discussed with the patient and family. After consideration of risks, benefits and other options for treatment, the patient has consented to  Procedure(s): TRANSCATHETER AORTIC VALVE REPLACEMENT, TRANSFEMORAL (N/A) TRANSESOPHAGEAL ECHOCARDIOGRAM (TEE) (N/A) as a surgical intervention .  The patient's history has been reviewed, patient examined, no change in status, stable for surgery.  I have reviewed the patient's chart and labs.  Questions were answered to the patient's satisfaction.     Gaye Pollack

## 2015-06-26 NOTE — Interval H&P Note (Signed)
History and Physical Interval Note:  06/26/2015 5:58 AM  [name] [name]  has presented today for surgery, with the diagnosis of SEVERE AS  The various methods of treatment have been discussed with the patient and family. After consideration of risks, benefits and other options for treatment, the patient has consented to  Procedure(s): TRANSCATHETER AORTIC VALVE REPLACEMENT, TRANSFEMORAL (N/A) TRANSESOPHAGEAL ECHOCARDIOGRAM (TEE) (N/A) as a surgical intervention .  The patient's history has been reviewed, patient examined, no change in status, stable for surgery.  I have reviewed the patient's chart and labs.  Questions were answered to the patient's satisfaction.     Gaye Pollack

## 2015-06-26 NOTE — Anesthesia Preprocedure Evaluation (Signed)
Anesthesia Evaluation  Patient identified by MRN, date of birth, ID band Patient awake    Reviewed: Allergy & Precautions, NPO status , Patient's Chart, lab work & pertinent test results  Airway Mallampati: II   Neck ROM: full    Dental   Pulmonary shortness of breath, sleep apnea , COPD, former smoker,    breath sounds clear to auscultation       Cardiovascular hypertension, + Peripheral Vascular Disease  + Valvular Problems/Murmurs AS  Rhythm:regular Rate:Normal  Severe AS.   Neuro/Psych  Headaches, Anxiety    GI/Hepatic PUD,   Endo/Other  obese  Renal/GU      Musculoskeletal  (+) Arthritis ,   Abdominal   Peds  Hematology   Anesthesia Other Findings   Reproductive/Obstetrics                             Anesthesia Physical Anesthesia Plan  ASA: III  Anesthesia Plan: General   Post-op Pain Management:    Induction: Intravenous  Airway Management Planned: Oral ETT  Additional Equipment: Arterial line, CVP, PA Cath, Ultrasound Guidance Line Placement and TEE  Intra-op Plan:   Post-operative Plan: Extubation in OR  Informed Consent: I have reviewed the patients History and Physical, chart, labs and discussed the procedure including the risks, benefits and alternatives for the proposed anesthesia with the patient or authorized representative who has indicated his/her understanding and acceptance.     Plan Discussed with: CRNA, Anesthesiologist and Surgeon  Anesthesia Plan Comments:         Anesthesia Quick Evaluation

## 2015-06-26 NOTE — H&P (Signed)
[name]       [name]             [phone]      Cardiothoracic Surgery History and Physical   Referring Provider is Tonia Ghent, MD PCP is [name], MD  Chief Complaint  Patient presents with  . Severe Aortic Stenosis        HPI:  The patient is a 82 year old gentleman with a prior smoking history, severe COPD on home oxygen, OSA on CPAP who was evaluated by Dr. [name] for a 6-12 month history of worsening shortness of breath and fatigue with exertion. He has also had some chest pressure with activity but denies any dizziness or syncope. He has had chronic lower extremity edema that he says occurred since he took Celebrex years ago. His wife and daughter are with him today and have also noticed a decline in his exertional tolerance over the past 6 months. Dr. [name] noted a systolic murmur and an echo showed severe AS with a mean gradient of 51 mm Hg. The LVEF was normal. 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 The AV mean gradient was 44 mm Hg with a calculated AVA of 1 cm2.   He lives at home with his wife and is fairly sedentary and on oxygen 24/7. He moves around his house well and can care for himself. He uses a Corporate investment banker when out of the house.     Past Medical History  Diagnosis Date  . OSA on CPAP   . Hyperglycemia   . COPD (chronic obstructive pulmonary disease) (HCC)     on home O2 4LPM  . Anxiety   . Hemorrhoids   . Diverticulosis   . Kidney stones   . PUD (peptic ulcer disease)   . Former smoker     quit 2001  . Right knee DJD 10/31/2011    Severe  . Severe aortic stenosis     a. Echo 11/16: Mod LVH, EF 55-60%, no RWMA, Gr 1 DD, severe AS (mean 51 mmHg;  peak 86 mmHg), MAC, mild LAE    Past Surgical History  Procedure Laterality Date  . Knee surgery    . Cholecystectomy    . Tonsillectomy    . Cardiac catheterization N/A 05/15/2015    Procedure: Right/Left Heart Cath and Coronary Angiography; Surgeon: [name], MD; Location: [facility]; Service: Cardiovascular; Laterality: N/A;    Family History  Problem Relation Age of Onset  . Stroke Mother   . Heart attack Mother 47    deceased  . Prostate cancer Father 44    Social History   Social History  . Marital Status: Married    Spouse Name: N/A  . Number of Children: 1  . Years of Education: N/A   Occupational History  . 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 Smoking status: Former Smoker -- 1.50 packs/day for 53 years    Types: Cigarettes    Quit date: 06/23/1999  . Smokeless tobacco: Former Systems developer    Types: Snuff, Chew  . Alcohol Use: No  . Drug Use: No  . Sexual Activity: Not on file  Other Topics Concern  . Not on file   Social History Narrative   Retired, print shop   Married 9+ years   1 daughter   Former smoker    Current Outpatient Prescriptions  Medication Sig Dispense Refill  . aspirin EC 81 MG tablet Take 1 tablet (81 mg total) by mouth daily.    Marland Kitchen guaiFENesin (MUCINEX) 600 MG 12 hr tablet Take 1,200 mg by mouth 2 (two) times daily.     Marland Kitchen LORazepam (ATIVAN) 1 MG tablet TAKE 1/4 TO 1/2 TABLET BY MOUTH EVERY 6 HOURS AS NEEDED 30 tablet 2  . OXYGEN Inhale into the lungs. 4 Liters    . PROAIR HFA 108 (90 BASE) MCG/ACT inhaler INHALE 1-2 PUFFS INTO THE LUNGS EVERY 6 (SIX) HOURS AS NEEDED FOR WHEEZING. 8.5 each 3  . Respiratory Therapy Supplies (FLUTTER) DEVI Use as directed 1 each 0  . SYMBICORT 160-4.5 MCG/ACT inhaler  INHALE 2 PUFFS BY MOUTH TWICE A DAY 10.2 Inhaler 5  . tiotropium (SPIRIVA HANDIHALER) 18 MCG inhalation capsule Place 1 capsule (18 mcg total) into inhaler and inhale daily. 30 capsule 6  . furosemide (LASIX) 20 MG tablet Take 1 tablet (20 mg total) by mouth daily. 30 tablet 6   No current facility-administered medications for this visit.    Allergies  Allergen Reactions  . Iohexol Hives and Swelling    Pt reports swelling, redness, hives, and blisters   . Atorvastatin     REACTION: aches  . Celecoxib     REACTION: rash  . Clinoril [Sulindac]     REACTION: rash  . Nitrofurantoin   . Sulfa Antibiotics     Intolerant but unrecalled.       Review of Systems:  General:normal appetite, decreased energy, no weight gain, some weight loss, no fever Cardiac:mild chest pain with exertion, no chest pain at rest, moderate SOB with mild exertion, has resting SOB, no PND, no orthopnea, no palpitations, no arrhythmia, no atrial fibrillation, chronic LE edema, no dizzy spells, no syncope Respiratory:chronic shortness of breath, uses home oxygen, no productive cough, no dry cough, no bronchitis, no wheezing, no hemoptysis, no asthma, no pain with inspiration or cough, has sleep apnea, uses CPAP at night GI:no difficulty swallowing, no reflux, no frequent heartburn, no hiatal hernia, no abdominal pain, has constipation, no diarrhea, no hematochezia, no hematemesis, no melena GU:no dysuria, no frequency, no urinary tract infection, no hematuria, no enlarged prostate, no kidney stones, no kidney disease Vascular:no pain suggestive of claudication, has pain in feet, has leg cramps, no varicose veins, no DVT, no non-healing foot  ulcer Neuro:no stroke, no TIA's, no seizures, has headaches, notemporary blindness one eye, no slurred speech, has peripheral neuropathy, no chronic pain, has instability of gait, no memory/cognitive dysfunction Musculoskeletal:no arthritis, no joint swelling, no myalgias, has difficulty walking, decreased mobility  Skin:no rash, no itching, no skin infections, no pressure sores or ulcerations, chronic redness of both lower legs Psych:no anxiety, no depression, no nervousness, no unusual recent stress Eyes:no blurry vision, no floaters, no recent vision changes, wears glasses or contacts [id number] hearing loss, no loose or painful teeth, wears dentures, last saw dentist this year Hematologic:has easy bruising, no abnormal bleeding, no clotting disorder, no frequent epistaxis Endocrine:no diabetes, does not check CBG's at home    Physical Exam:  BP 126/88 mmHg  Pulse 96  Resp 20  Ht 5\' 10"  (1.778 m)  Wt 270 lb (122.471 kg)  BMI 38.74 kg/m2  SpO2 95% General:Elderly, chronically  ill-appearing HEENT:Unremarkable , NCAT, PERLA, EOMI Neck:no JVD, no bruits, no adenopathy or thyromegaly Chest:clear to auscultation, symmetrical breath sounds, no wheezes, no rhonchi  CV:RRR, grade III/VI crescendo/decrescendo murmur heard best at RSB, no diastolic murmur Abdomen:soft, non-tender, no masses or organomegaly Extremities:warm, well-perfused, pulses  no palpable, moderate LE edema with purplish discoloration and thickening of the skin in the lower legs.  Rectal/GUDeferred Neuro:Grossly non-focal and symmetrical throughout Skin:Clean and dry, no rashes, no breakdown   Diagnostic Tests:  [facility]            [address], [place] [address]              [phone]  ------------------------------------------------------------------- Transthoracic Echocardiography  (Report amended )  Patient:  [name] [name], [name] [name] MR #:    [id number] Study Date: 04/18/2015 Gender:   M Age:    86 Height:   177.8 cm Weight:   126.1 kg BSA:    2.55 m^2 Pt. Status: Room:  [place], [place], [place], Outpatient SONOGRAPHER [facility], RDCS  cc:  ------------------------------------------------------------------- LV EF: 55% -  60%  ------------------------------------------------------------------- Indications:   Dyspnea (R06.00).  ------------------------------------------------------------------- History:  PMH:  Chronic obstructive pulmonary disease. Risk factors: Chronic Respiratory Failure (on 4 Liters home O2), Obstructive Sleep Apnea with CPAP. Family history of coronary artery disease. Former tobacco use.  ------------------------------------------------------------------- Study Conclusions  - Left ventricle: The cavity size was normal. Wall thickness was increased in a pattern of moderate LVH. Systolic function was normal. The estimated ejection fraction was in the range of 55% to 60%. Wall motion was normal; there were no regional wall motion abnormalities. Doppler parameters are consistent with abnormal left ventricular  relaxation (grade 1 diastolic dysfunction). Doppler parameters are consistent with high ventricular filling pressure. - Aortic valve: There was severe stenosis. - Mitral valve: Severely calcified annulus. - Left atrium: The atrium was mildly dilated.  Impressions:  - Normal LV systolic function; grade 1 diastolic dysfunction; elevated LV filling pressures; moderate LVH; calcified aortic valve with severe AS (mean gradient 51 mmHg); severe MAC; mild LAE.  Transthoracic echocardiography. M-mode, complete 2D, spectral Doppler, and color Doppler. Birthdate: Patient birthdate: 1933-12-09. Age: Patient is 82 yr old. Sex: Gender: male. BMI: 39.9 kg/m^2. Blood pressure:   122/80 Patient status: Outpatient. Study date: Study date: 04/18/2015. Study time: 01:25 PM. Location: Echo laboratory.  -------------------------------------------------------------------  ------------------------------------------------------------------- Left ventricle: The cavity size was normal. Wall thickness was increased in a pattern of moderate LVH. Systolic function was normal. The estimated ejection fraction was in the range of 55% to 60%. Wall motion was normal; there were no regional wall motion abnormalities. Doppler parameters are consistent with abnormal left ventricular relaxation (grade 1 diastolic dysfunction). Doppler parameters are consistent with high ventricular filling pressure.  ------------------------------------------------------------------- Aortic valve:  Trileaflet; moderately calcified leaflets. Doppler:  There was severe stenosis.  There was no regurgitation.  VTI ratio of LVOT to aortic valve: 0.33. Indexed valve area (VTI): 0.45 cm^2/m^2. Peak velocity ratio of LVOT to aortic valve: 0.22. Indexed valve area (Vmax): 0.31 cm^2/m^2. Mean velocity ratio of LVOT to aortic valve: 0.28. Indexed valve area (Vmean): 0.38 cm^2/m^2.  Mean gradient (S): 51 mm  Hg. Peak gradient (S): 86 mm Hg.  ------------------------------------------------------------------- Aorta: Aortic root: The aortic root was normal in size.  ------------------------------------------------------------------- Mitral valve:  Severely calcified annulus. Mobility was  not restricted. Doppler: Transvalvular velocity was within the normal range. There was no evidence for stenosis. There was no regurgitation.  Valve area by pressure half-time: 3.55 cm^2. Indexed valve area by pressure half-time: 1.39 cm^2/m^2. Indexed valve area by continuity equation (using LVOT flow): 1.22 cm^2/m^2.  Mean gradient (D): 3 mm Hg. Peak gradient (D): 3 mm Hg.  ------------------------------------------------------------------- Left atrium: The atrium was mildly dilated.  ------------------------------------------------------------------- Right ventricle: The cavity size was normal. Systolic function was normal.  ------------------------------------------------------------------- Pulmonic valve:  Doppler: Transvalvular velocity was within the normal range. There was no evidence for stenosis.  ------------------------------------------------------------------- Tricuspid valve:  Structurally normal valve.  Doppler: Transvalvular velocity was within the normal range. There was no regurgitation.  ------------------------------------------------------------------- Right atrium: The atrium was normal in size.  ------------------------------------------------------------------- Pericardium: There was no pericardial effusion.  ------------------------------------------------------------------- Systemic veins: Inferior vena cava: The vessel was normal in size. The respirophasic diameter changes were in the normal range (= 50%), consistent with normal central venous pressure. Diameter: 18.8  mm.  ------------------------------------------------------------------- Measurements  IVC                    Value     Reference ID                    18.8 mm    ---------  Left ventricle              Value     Reference LV ID, ED, PLAX chordal          44.3 mm    43 - 52 LV ID, ES, PLAX chordal          28.3 mm    23 - 38 LV fx shortening, PLAX chordal      36  %    >=29 LV PW thickness, ED            16  mm    --------- IVS/LV PW ratio, ED            0.86      <=1.3 Stroke volume, 2D             95  ml    --------- Stroke volume/bsa, 2D           37  ml/m^2  --------- LV ejection fraction, 1-p A4C       57  %    --------- LV end-diastolic volume, 2-p       131  ml    --------- LV end-systolic volume, 2-p        61  ml    --------- LV ejection fraction, 2-p         53  %    --------- Stroke volume, 2-p            70  ml    --------- LV end-diastolic volume/bsa, 2-p     51  ml/m^2  --------- LV end-systolic volume/bsa, 2-p      24  ml/m^2  --------- Stroke volume/bsa, 2-p          27.4 ml/m^2  --------- LV e&', lateral              6.58 cm/s   --------- LV E/e&', lateral             13.88     --------- LV e&', medial               4.5  cm/s   --------- LV E/e&',  medial              20.29     --------- LV e&', average              5.54 cm/s   --------- LV E/e&', average             16.48     ---------  Ventricular septum            Value     Reference IVS thickness, ED             13.8 mm    ---------  LVOT                   Value      Reference LVOT ID, S                21  mm    --------- LVOT area                 3.46 cm^2   --------- LVOT peak velocity, S           104  cm/s   --------- LVOT mean velocity, S           76.2 cm/s   --------- LVOT VTI, S                27.6 cm    --------- LVOT peak gradient, S           4   mm Hg  ---------  Aortic valve               Value     Reference Aortic valve peak velocity, S       464  cm/s   --------- Aortic valve mean velocity, S       270  cm/s   --------- Aortic valve VTI, S            84  cm    --------- Aortic mean gradient, S          51  mm Hg  --------- Aortic peak gradient, S          86  mm Hg  --------- VTI ratio, LVOT/AV            0.33      --------- Aortic valve area/bsa, VTI        0.45 cm^2/m^2 --------- Velocity ratio, peak, LVOT/AV       0.22      --------- Aortic valve area/bsa, peak        0.31 cm^2/m^2 --------- velocity Velocity ratio, mean, LVOT/AV       0.28      --------- Aortic valve area/bsa, mean        0.38 cm^2/m^2 --------- velocity  Aorta                   Value     Reference Aortic root ID, ED            34  mm    ---------  Left atrium                Value     Reference LA ID, A-P, ES              45  mm    --------- LA ID/bsa, A-P              1.76 cm/m^2  <=2.2 LA volume, S  69  ml    --------- LA volume/bsa, S             27.1 ml/m^2  --------- LA volume, ES, 1-p A4C          58  ml    --------- LA volume/bsa, ES, 1-p A4C        22.7 ml/m^2  --------- LA volume, ES, 1-p A2C          78   ml    --------- LA volume/bsa, ES, 1-p A2C        30.6 ml/m^2  ---------  Mitral valve               Value     Reference Mitral E-wave peak velocity        91.3 cm/s   --------- Mitral A-wave peak velocity        143  cm/s   --------- Mitral mean velocity, D          75.4 cm/s   --------- Mitral deceleration time     (H)   293  ms    150 - 230 Mitral pressure half-time         62  ms    --------- Mitral mean gradient, D          3   mm Hg  --------- Mitral peak gradient, D          3   mm Hg  --------- Mitral E/A ratio, peak          0.64      --------- Mitral valve area, PHT, DP        3.55 cm^2   --------- Mitral valve area/bsa, PHT, DP      1.39 cm^2/m^2 --------- Mitral valve area/bsa, LVOT        1.22 cm^2/m^2 --------- continuity Mitral annulus VTI, D           30.8 cm    ---------  Right ventricle              Value     Reference RV s&', lateral, S             18  cm/s   ---------  Legend: (L) and (H) mark values outside specified reference range.  ------------------------------------------------------------------- [place] Muta Crenshaw 2016-11-02T15:58:53  [name], MD (Primary)      Procedures    Right/Left Heart Cath and Coronary Angiography    Conclusion    1. Large, dominant RCA, possibly with a single coronary artery supplying an anomalous left circumflex and supplying distal circulation to the LAD territory 2. No evidence of obstructive coronary artery disease 3. 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 Severely calcified aortic valve with restricted mobility, hemodynamics demonstrating a mean transaortic gradient of 44 mmHg and calculated aortic valve area of 1 cm. 5. Mild pulmonary HTN and elevated right heart  pressures, likely related to LV diastolic dysfunction  Recommendations: Gated cardiac CTA and CTA of the chest/abdomen/pelvis for TAVR evaluation. Cardiac surgical consultation for treatment options of severe symptomatic aortic stenosis.    Indications    Severe aortic stenosis [I35.0 (ICD-10-CM)]    Technique and Indications    INDICATION: Severe aortic stenosis  PROCEDURAL DETAILS: There was an indwelling IV in a left antecubital vein. Using normal sterile technique, the IV was changed out for a 5 Fr brachial sheath over a 0.018 inch wire. The left wrist was then prepped, draped, and anesthetized with 1% lidocaine. Using the  modified Seldinger technique a 5/6 French Slender sheath was placed in the left radial artery. Intra-arterial verapamil was administered through the radial artery sheath. IV heparin was administered after a JR4 catheter was advanced into the central aorta. A Swan-Ganz catheter was used for the right heart catheterization. Standard protocol was followed for recording of right heart pressures and sampling of oxygen saturations. Fick cardiac output was calculated. Standard Judkins catheters were used for selective coronary angiography and left ventriculography. There were no immediate procedural complications. The patient was transferred to the post catheterization recovery area for further monitoring.    Estimated blood loss <50 mL. There were no immediate complications during the procedure.    Coronary Findings    Dominance: Right   Left Anterior Descending  The LAD is not visualized from the aortic root. There is no left main visualized. It appears the LAD territory fills from the distal branch vessels of the RCA.     Left Circumflex  The left circumflex has an anomalous origin, arising from the right cusp. There is no obstructive disease present.     Right Coronary Artery  The right coronary artery is a large, dominant vessel. The  distal PDA and PLA branches appear to supply the LAD territory.       Right Heart Pressures Hemodynamic findings consistent with mild pulmonary hypertension. Elevated LV EDP consistent with volume overload.    Left Heart    Aortic Valve There is severe aortic valve stenosis, and trivial (1+) aortic regurgitation. The aortic valve is calcified. There is restricted aortic valve motion.    Coronary Diagrams    Diagnostic Diagram            Implants    Name ID Temporary Type Supply   No information to display    PACS Images    Show images for Cardiac catheterization     Link to Procedure Log    Procedure Log      Hemo Data       Most Recent Value   Fick Cardiac Output  7.49 L/min   Fick Cardiac Output Index  3.16 (L/min)/BSA   Aortic Mean Gradient  44.1 mmHg   Aortic Peak Gradient  39 mmHg   Aortic Valve Area  1.02   Aortic Value Area Index  0.43 cm2/BSA   RA A Wave  18 mmHg   RA V Wave  17 mmHg   RA Mean  15 mmHg   RV Systolic Pressure  49 mmHg   RV Diastolic Pressure  15 mmHg   RV EDP  18 mmHg   PA Systolic Pressure  49 mmHg   PA Diastolic Pressure  22 mmHg   PA Mean  34 mmHg   PW A Wave  29 mmHg   PW V Wave  25 mmHg   PW Mean  24 mmHg   AO Systolic Pressure  [id number] mmHg   AO Diastolic Pressure  71 mmHg   AO Mean  98 mmHg   LV Systolic Pressure  [id number] mmHg   LV Diastolic Pressure  22 mmHg   LV EDP  28 mmHg   Arterial Occlusion Pressure Extended Systolic Pressure  123XX123 mmHg   Arterial Occlusion Pressure Extended Diastolic Pressure  77 mmHg   Arterial Occlusion Pressure Extended Mean Pressure  112 mmHg   QP/QS  1   TPVR Index  10.76 HRUI   TSVR Index  31.02 HRUI   PVR SVR Ratio  0.12   TPVR/TSVR Ratio  0.35  ADDENDUM REPORT: 05/23/2015  08:53 CLINICAL DATA: Aortic stenosis EXAM: Cardiac TAVR CT TECHNIQUE: The patient was scanned on a Philips 256 scanner. A 120 kV retrospective scan was triggered in the descending thoracic aorta at 111 HU's. Gantry rotation speed was 270 msecs and collimation was .9 mm. 15 mg of iv metoprolol and 0.4 sl NTG were given. The 3D data set was reconstructed in 5% intervals of the R-R cycle. Systolic and diastolic phases were analyzed on a dedicated work station using MPR, MIP and VRT modes. The patient received 80 cc of contrast. FINDINGS: Aortic Valve: Severely thickened and calcified trileaflet aortic valve with severely restricted leaflet opening and mild subvalvular calcifications. Aorta: Normal caliber. Mild diffuse calcifications. No dissection. Sinotubular Junction: 30 x 28 mm Ascending Thoracic Aorta: 37 x 36 mm Aortic Arch: 32 x 31 mm Descending Thoracic Aorta: 28 x 27 mm Sinus of Valsalva Measurements: Non-coronary: 31 mm Right -coronary: 31 mm Left -coronary: 30 mm Coronary Artery Height above Annulus: Left Main: There is no left main artery Right Coronary: 12 mm Virtual Basal Annulus Measurements: Maximum/Minimum Diameter: 28 x 25 mm Perimeter: 92 mm Area: 548 mm2 Coronary Arteries: There are anomalous coronary arteries. No left main or other artery is originating from the left sinus of Valsalva. RCA is a very large tortuous vessel that gives rise to a large PDA and a large PLA that also gives a branch supplying inferolateral wall. There is only mild calcified plaque with associated stenosis 0-25%. There is a small LAD originating from the proximal portion of RCA with interarterial course (between aorta and pulmonary artery and extending into mid to distal portion of the interventricular sulcus). There is another small branch originating from the proximal RCA (distally to LAD) with anterior course (anteriorly to the pulmonary artery) and extending into  proximal portions of the AV groove (rudimentary LCX artery). Optimum Fluoroscopic Angle for Delivery: LAO 16 CRA 0. IMPRESSION: 1. Severely thickened and calcified trileaflet aortic valve with severely restricted leaflet opening and annular measurements suitable for delivery of 29 mm Edward-SAPIEN 3 valve. The area is borderline between 26 and 29 mm valve, however diameter measurements consistent with 29 mm valve. 2. 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 Only mild CAD is seen. 3. Optimum Fluoroscopic Angle for Delivery: LAO 61 CRA 0. Ena Dawley Electronically Signed  By: Ena Dawley  On: 05/23/2015 08:53   CLINICAL DATA: 83 year old male with history of severe aortic stenosis. Preprocedural study prior to potential transcatheter aortic valve replacement (TAVR) procedure.  EXAM: CT ANGIOGRAPHY CHEST, ABDOMEN AND PELVIS  TECHNIQUE: Multidetector CT imaging through the chest, abdomen and pelvis was performed using the standard protocol during bolus administration of intravenous contrast. Multiplanar reconstructed images and MIPs were obtained and reviewed to evaluate the vascular anatomy.  CONTRAST: 12mL OMNIPAQUE IOHEXOL 350 MG/ML SOLN  COMPARISON: Chest CT 04/18/2015.  FINDINGS: CTA CHEST FINDINGS  Mediastinum/Lymph Nodes: Heart size is borderline enlarged. Concentric left ventricular hypertrophy. There is no significant pericardial fluid, thickening or pericardial calcification. There is atherosclerosis of the thoracic aorta, the great vessels of the mediastinum and the coronary arteries, including calcified atherosclerotic plaque in the right coronary artery. No pathologically enlarged mediastinal or hilar lymph nodes. 1.9 x 2.1 cm lesion in the anterior mediastinum immediately anterior to the left innominate vein only slightly larger than remote  prior examination from 03/03/2006, likely to represent a benign cystic lesion, potentially of thymic  origin. Esophagus is unremarkable in appearance. No axillary lymphadenopathy.  Lungs/Pleura: 4 mm right upper lobe nodule (image 18 of series 407). Linear scarring in the left lower lobe is unchanged. Small cluster of peribronchovascular micronodules noted in the right middle lobe on the prior study have resolved, presumably areas of benign mucoid impaction on the prior examination. No acute consolidative airspace disease. No pleural effusions.  Musculoskeletal/Soft Tissues: There are no aggressive appearing lytic or blastic lesions noted in the visualized portions of the skeleton.  CTA ABDOMEN AND PELVIS FINDINGS  Hepatobiliary: Diffuse low attenuation throughout the hepatic parenchyma, compatible with hepatic steatosis. No cystic or solid hepatic lesions. No intra or extrahepatic biliary ductal dilatation. Status post cholecystectomy.  Pancreas: No pancreatic mass. No pancreatic ductal dilatation. No pancreatic or peripancreatic fluid or inflammatory changes.  Spleen: Unremarkable.  Adrenals/Urinary Tract: Bilateral adrenal glands are normal in appearance. Multiple low-attenuation lesions in the kidneys bilaterally, the largest of which are compatible with simple cysts, including the largest lesion which is exophytic measuring 6.5 cm in the upper pole of the right kidney. Several other smaller sub cm low-attenuation lesions are noted in the kidneys bilaterally, too small to definitively characterize, but also favored to represent tiny cysts. No hydroureteronephrosis. Urinary bladder is normal in appearance.  Stomach/Bowel: Normal appearance of the stomach. Numerous colonic diverticulae are noted, particularly in the sigmoid colon, without surrounding inflammatory changes to suggest an acute diverticulitis at this time. Normal appendix.  Vascular/Lymphatic:  Extensive atherosclerosis throughout the abdominal and pelvic vasculature, with vascular findings and measurements pertinent to potential TAVR procedure, as detailed below. In addition, there is fusiform aneurysmal dilatation of the infrarenal abdominal aorta which measures up to 3.8 x 3.6 cm immediately beneath the inferior mesenteric artery (IMA) origin. Single renal arteries are noted bilaterally. Celiac axis, superior mesenteric artery and inferior mesenteric artery are all widely patent. No lymphadenopathy noted in the abdomen or pelvis.  Reproductive: Prostate gland and seminal vesicles are unremarkable in appearance.  Other: No significant volume of ascites. No pneumoperitoneum.  Musculoskeletal: There are no aggressive appearing lytic or blastic lesions noted in the visualized portions of the skeleton.  VASCULAR MEASUREMENTS PERTINENT TO TAVR:  AORTA:  Minimal Aortic Diameter - 23 x 22 mm  Severity of Aortic Calcification - moderate to severe  RIGHT PELVIS:  Right Common Iliac Artery -  Minimal Diameter - 12.3 x 12.5 mm  Tortuosity - mild  Calcification - mild  Right External Iliac Artery -  Minimal Diameter - 9.2 x 9.0 mm  Tortuosity - moderate  Calcification - none  Right Common Femoral Artery -  Minimal Diameter - 8.1 x 6.5 mm  Tortuosity - mild  Calcification - mild  LEFT PELVIS:  Left Common Iliac Artery -  Minimal Diameter - 11.8 x 9.6 mm  Tortuosity - mild  Calcification - moderate  Left External Iliac Artery -  Minimal Diameter - 9.7 x 9.4 mm  Tortuosity - moderate  Calcification - none  Left Common Femoral Artery -  Minimal Diameter - 10.1 x 9.6 mm  Tortuosity - mild  Calcification - mild  Review of the MIP images confirms the above findings.  IMPRESSION: 1. Vascular findings and measurements pertinent to potential TAVR procedure, as detailed above. This patient does appear to  have suitable pelvic arterial access bilaterally. 2. Mild fusiform aneurysmal dilatation of the infrarenal abdominal aorta which measures up to 3.8 x 3.6 cm. Recommend followup by ultrasound in 2 years. This recommendation follows ACR consensus guidelines: White  Paper of the ACR Incidental Findings Committee II on Vascular Findings. J Am Coll Radiol 2013; 10:789-794. 3. Severe thickening calcification of the aortic valve, compatible with the patient's reported clinical history of aortic stenosis. 4. Mild colonic diverticulosis without evidence of acute diverticulitis at this time. 5. Hepatic steatosis. 6. Additional incidental findings, as above.   Electronically Signed  By: Vinnie Langton M.D.  On: 05/22/2015 13:46  STS Risk Calculator: Procedure: AV Replacement  Risk of Mortality: 3.101%  Morbidity or Mortality: 18.832%  Long Length of Stay: 9.893%  Short Length of Stay: 28.254%  Permanent Stroke: 0.944%  Prolonged Ventilation: 13.547%  DSW Infection: 0.995%    Impression:  He has stage D severe aortic stenosis with NYHA class III symptoms of exertional shortness of breath and fatigue with minimal activity. He has severe oxygen-dependent COPD with an FEV1 of 0.77 ( 24% pred) but his symptoms have been progressing over the last 6-12 months and are likely worsening due to his aortic stenosis. I discussed the progressive nature of aortic stenosis with the patient and his family and the limitations of medical therapy and the poor prognosis associated with symptomatic aortic stenosis. I have reviewed potential treatment options, including palliative medical therapy, conventional surgical aortic valve replacement, and transcatheter aortic valve replacement. I don't think he is a candidate for open surgical aortic valve replacement due to the severity of his COPD. I think his surgical risk is underestimated by the STS risk estimator. I think TAVR is the best option for him.  His cardiac CT shows an annular area of 548 that is in the grey zone between a 26 mm and 29 mm Sapien 3 valve.  His abdominal and pelvic CT show suitable pelvic arterial access bilaterally for a femoral approach. He does have a small infra-renal AAA measuring 3.8 x 3.6 cm.   A discussion has been held regarding what types of management strategies would be attempted intraoperatively in the event of life-threatening complications, including whether or not the patient would be considered a candidate for the use of cardiopulmonary bypass and/or conversion to open sternotomy for attempted surgical intervention. The patient has been advised of a variety of complications that might develop including but not limited to risks of death, stroke, paravalvular leak, aortic dissection or other major vascular complications, aortic annulus rupture, device embolization, cardiac rupture or perforation, mitral regurgitation, acute myocardial infarction, arrhythmia, heart block or bradycardia requiring permanent pacemaker placement, congestive heart failure, respiratory failure, renal failure, pneumonia, infection, other late complications related to structural valve deterioration or migration, or other complications that might ultimately cause a temporary or permanent loss of functional independence or other long term morbidity. The patient provides full informed consent for the procedure as described and all questions were answered.   Plan:     Transfemoral TAVR on 06/26/2015

## 2015-06-27 ENCOUNTER — Other Ambulatory Visit: Payer: Self-pay

## 2015-06-27 ENCOUNTER — Inpatient Hospital Stay (HOSPITAL_COMMUNITY): Payer: PPO

## 2015-06-27 ENCOUNTER — Encounter (HOSPITAL_COMMUNITY): Payer: Self-pay | Admitting: Cardiovascular Disease

## 2015-06-27 DIAGNOSIS — I350 Nonrheumatic aortic (valve) stenosis: Secondary | ICD-10-CM | POA: Diagnosis not present

## 2015-06-27 DIAGNOSIS — Z952 Presence of prosthetic heart valve: Secondary | ICD-10-CM

## 2015-06-27 DIAGNOSIS — R918 Other nonspecific abnormal finding of lung field: Secondary | ICD-10-CM | POA: Diagnosis not present

## 2015-06-27 DIAGNOSIS — Q245 Malformation of coronary vessels: Secondary | ICD-10-CM | POA: Diagnosis not present

## 2015-06-27 DIAGNOSIS — J9610 Chronic respiratory failure, unspecified whether with hypoxia or hypercapnia: Secondary | ICD-10-CM | POA: Diagnosis not present

## 2015-06-27 DIAGNOSIS — Z954 Presence of other heart-valve replacement: Secondary | ICD-10-CM | POA: Diagnosis not present

## 2015-06-27 DIAGNOSIS — I5033 Acute on chronic diastolic (congestive) heart failure: Secondary | ICD-10-CM | POA: Diagnosis not present

## 2015-06-27 LAB — POCT I-STAT, CHEM 8
BUN: 17 mg/dL (ref 6–20)
BUN: 16 mg/dL (ref 6–20)
BUN: 16 mg/dL (ref 6–20)
CHLORIDE: 104 mmol/L (ref 101–111)
CHLORIDE: 103 mmol/L (ref 101–111)
CREATININE: 0.8 mg/dL (ref 0.61–1.24)
Calcium, Ion: 1.08 mmol/L — ABNORMAL LOW (ref 1.13–1.30)
Calcium, Ion: 1.12 mmol/L — ABNORMAL LOW (ref 1.13–1.30)
Calcium, Ion: 1.11 mmol/L — ABNORMAL LOW (ref 1.13–1.30)
Chloride: 103 mmol/L (ref 101–111)
Creatinine, Ser: 0.9 mg/dL (ref 0.61–1.24)
Creatinine, Ser: 0.9 mg/dL (ref 0.61–1.24)
GLUCOSE: 114 mg/dL — AB (ref 65–99)
GLUCOSE: 126 mg/dL — AB (ref 65–99)
Glucose, Bld: 115 mg/dL — ABNORMAL HIGH (ref 65–99)
HEMATOCRIT: 37 % — AB (ref 39.0–52.0)
HEMATOCRIT: 34 % — AB (ref 39.0–52.0)
HEMATOCRIT: 35 % — AB (ref 39.0–52.0)
HEMOGLOBIN: 12.6 g/dL — AB (ref 13.0–17.0)
HEMOGLOBIN: 11.6 g/dL — AB (ref 13.0–17.0)
HEMOGLOBIN: 11.9 g/dL — AB (ref 13.0–17.0)
POTASSIUM: 4.2 mmol/L (ref 3.5–5.1)
POTASSIUM: 3.9 mmol/L (ref 3.5–5.1)
POTASSIUM: 3.8 mmol/L (ref 3.5–5.1)
SODIUM: 140 mmol/L (ref 135–145)
SODIUM: 139 mmol/L (ref 135–145)
Sodium: 139 mmol/L (ref 135–145)
TCO2: 31 mmol/L (ref 0–100)
TCO2: 27 mmol/L (ref 0–100)
TCO2: 27 mmol/L (ref 0–100)

## 2015-06-27 LAB — BASIC METABOLIC PANEL
ANION GAP: 7 (ref 5–15)
BUN: 10 mg/dL (ref 6–20)
CALCIUM: 8.4 mg/dL — AB (ref 8.9–10.3)
CO2: 32 mmol/L (ref 22–32)
CREATININE: 0.68 mg/dL (ref 0.61–1.24)
Chloride: 101 mmol/L (ref 101–111)
GFR calc Af Amer: >60 mL/min (ref >60)
GLUCOSE: 88 mg/dL (ref 65–99)
Potassium: 3.9 mmol/L (ref 3.5–5.1)
Sodium: 140 mmol/L (ref 135–145)

## 2015-06-27 LAB — GLUCOSE, CAPILLARY
GLUCOSE-CAPILLARY: 96 mg/dL (ref 65–99)
GLUCOSE-CAPILLARY: 102 mg/dL — AB (ref 65–99)
GLUCOSE-CAPILLARY: 113 mg/dL — AB (ref 65–99)
Glucose-Capillary: 104 mg/dL — ABNORMAL HIGH (ref 65–99)
Glucose-Capillary: 85 mg/dL (ref 65–99)
Glucose-Capillary: 94 mg/dL (ref 65–99)

## 2015-06-27 LAB — CBC
HEMATOCRIT: 32.1 % — AB (ref 39.0–52.0)
Hemoglobin: 10.2 g/dL — ABNORMAL LOW (ref 13.0–17.0)
MCH: 30.8 pg (ref 26.0–34.0)
MCHC: 31.8 g/dL (ref 30.0–36.0)
MCV: 97 fL (ref 78.0–100.0)
PLATELETS: 170 10*3/uL (ref 150–400)
RBC: 3.31 MIL/uL — ABNORMAL LOW (ref 4.22–5.81)
RDW: 13.4 % (ref 11.5–15.5)
WBC: 9.1 10*3/uL (ref 4.0–10.5)

## 2015-06-27 LAB — MAGNESIUM: MAGNESIUM: 1.5 mg/dL — AB (ref 1.7–2.4)

## 2015-06-27 LAB — POCT I-STAT 3, ART BLOOD GAS (G3+)
ACID-BASE EXCESS: 6 mmol/L — AB (ref 0.0–2.0)
BICARBONATE: 30.9 meq/L — AB (ref 20.0–24.0)
O2 Saturation: 100 %
PH ART: 7.453 — AB (ref 7.350–7.450)
TCO2: 32 mmol/L (ref 0–100)
pCO2 arterial: 44.2 mmHg (ref 35.0–45.0)
pO2, Arterial: 434 mmHg — ABNORMAL HIGH (ref 80.0–100.0)

## 2015-06-27 MED ORDER — FUROSEMIDE 10 MG/ML IJ SOLN
40.0000 mg | Freq: Once | INTRAMUSCULAR | Status: AC
  Administered 2015-06-27: 40 mg via INTRAVENOUS
  Filled 2015-06-27: qty 4

## 2015-06-27 MED ORDER — LORAZEPAM 0.5 MG PO TABS
0.5000 mg | ORAL_TABLET | Freq: Four times a day (QID) | ORAL | Status: DC | PRN
  Administered 2015-06-27: 0.5 mg via ORAL
  Filled 2015-06-27: qty 1

## 2015-06-27 MED ORDER — POTASSIUM CHLORIDE CRYS ER 20 MEQ PO TBCR
40.0000 meq | EXTENDED_RELEASE_TABLET | Freq: Once | ORAL | Status: AC
  Administered 2015-06-27: 40 meq via ORAL
  Filled 2015-06-27: qty 2

## 2015-06-27 MED ORDER — INSULIN ASPART 100 UNIT/ML ~~LOC~~ SOLN: 0.0000 [IU] | Freq: Three times a day (TID) | SUBCUTANEOUS | Status: DC

## 2015-06-27 NOTE — Anesthesia Postprocedure Evaluation (Signed)
Anesthesia Post Note  Patient: [name] [name]  Procedure(s) Performed: Procedure(s) (LRB): TRANSCATHETER AORTIC VALVE REPLACEMENT, TRANSFEMORAL (N/A) TRANSESOPHAGEAL ECHOCARDIOGRAM (TEE) (N/A)  Patient location during evaluation: ICU Anesthesia Type: General Level of consciousness: patient remains intubated per anesthesia plan Pain management: pain level controlled Vital Signs Assessment: post-procedure vital signs reviewed and stable Respiratory status: patient remains intubated per anesthesia plan and respiratory function unstable Anesthetic complications: no    Last Vitals:  Filed Vitals:   06/27/15 1100 06/27/15 1200  BP: 101/73 122/55  Pulse: 67 65  Temp:    Resp: 24 22    Last Pain:  Filed Vitals:   06/27/15 1226  PainSc: 0-No pain                 Tank Difiore S

## 2015-06-27 NOTE — Progress Notes (Signed)
Patient tx to 2W28, belongings at bedside, SCDs in room, receiving RN and NT in room, patient placed on tele, no further questions from patient or receiving RN.  Rowe Pavy, RN

## 2015-06-27 NOTE — Progress Notes (Signed)
1 Day Post-Op Procedure(s) (LRB): TRANSCATHETER AORTIC VALVE REPLACEMENT, TRANSFEMORAL (N/A) TRANSESOPHAGEAL ECHOCARDIOGRAM (TEE) (N/A) Subjective:  Says he did not sleep at all but did use his CPAP. Walked about 200 ft today. 2 person assist.  Objective: Vital signs in last 24 hours: Temp:  [95.2 F (35.1 C)-98.7 F (37.1 C)] 97.9 F (36.6 C) (01/11 0735) Pulse Rate:  [54-88] 64 (01/11 0700) Cardiac Rhythm:  [-] Sinus bradycardia;Normal sinus rhythm;Bundle branch block (01/11 0712) Resp:  [11-25] 18 (01/11 0700) BP: (93-138)/(50-101) 130/64 mmHg (01/11 0600) SpO2:  [94 %-100 %] 99 % (01/11 0735) Arterial Line BP: (109-159)/(49-71) 122/62 mmHg (01/10 1700) FiO2 (%):  [40 %-50 %] 40 % (01/10 1400) Weight:  [125.465 kg (276 lb 9.6 oz)] 125.465 kg (276 lb 9.6 oz) (01/11 0500)  Hemodynamic parameters for last 24 hours: PAP: (27-36)/(15-25) 32/21 mmHg CO:  [5.4 L/min-6.5 L/min] 5.6 L/min CI:  [2.3 L/min/m2-2.8 L/min/m2] 2.4 L/min/m2  Intake/Output from previous day: 01/10 0701 - 01/11 0700 In: 4038.6 [P.O.:240; I.V.:3268.6; NG/GT:30; IV Piggyback:500] Out: 2885 [Urine:2685; Blood:200] Intake/Output this shift:    General appearance: alert and cooperative Neurologic: intact Heart: regular rate and rhythm, S1, S2 normal, no murmur, click, rub or gallop Lungs: clear to auscultation bilaterally Extremities: extremities normal, atraumatic, no cyanosis or edema Wound: groin sites ok  Lab Results:  Recent Labs  06/26/15 1110 06/27/15 0438  WBC 6.9 9.1  HGB 11.6* 10.2*  HCT 36.9* 32.1*  PLT 176 170   BMET:  Recent Labs  06/26/15 1008 06/26/15 1108 06/27/15 0438  NA 143 143 140  K 3.8 3.9 3.9  CL 101  --  101  CO2  --   --  32  GLUCOSE 185* 177* 88  BUN 9  --  10  CREATININE 0.50*  --  0.68  CALCIUM  --   --  8.4*    PT/INR:  Recent Labs  06/26/15 1110  LABPROT 17.2*  INR 1.39   ABG    Component Value Date/Time   PHART 7.354 06/26/2015 1556   HCO3 32.5*  06/26/2015 1556   TCO2 34 06/26/2015 1556   O2SAT 95.0 06/26/2015 1556   CBG (last 3)   Recent Labs  06/26/15 1942 06/26/15 2339 06/27/15 0338  GLUCAP 172* 104* 76   CLINICAL DATA: Transcatheter aortic valve replacement for severe aortic stenosis, morbid obesity, Celsius OPD.  EXAM: PORTABLE CHEST 1 VIEW  COMPARISON: Portable chest x-ray of June 26, 2015  FINDINGS: There has been interval extubation of the trachea and of the esophagus. The lungs remain well-expanded. There is no focal infiltrate. The pulmonary interstitial markings have normalized. The cardiac silhouette is top-normal in size. The pulmonary vascularity remains mildly prominent but pulmonary interstitial edema has markedly improved. The right internal jugular Cordis sheath tip projects over the proximal SVC.  IMPRESSION: Marked interval improvement in the pulmonary interstitium consistent with decreased pulmonary interstitial edema. Stable mild cardiomegaly. No pneumonia or pleural effusion. When the patient can tolerate the procedure, a PA and lateral chest x-ray would be useful.   Electronically Signed  By: David Martinique M.D.  On: 06/27/2015 07:18 Assessment/Plan: S/P Procedure(s) (LRB): TRANSCATHETER AORTIC VALVE REPLACEMENT, TRANSFEMORAL (N/A) TRANSESOPHAGEAL ECHOCARDIOGRAM (TEE) (N/A)  He is hemodynamically stable in sinus rhythm.  Wt is 9 lbs over preop so will give a dose of lasix this am.  Transfer to 2W and mobilize. Echo today     LOS: 1 day    Gaye Pollack 06/27/2015

## 2015-06-27 NOTE — Progress Notes (Signed)
Pt in room 2w28, from 2S, alert and oriented to unit and room. Pt sitting in chair, assessment completed, VSS. Placed on tele, RN and NT called separately to CCMD. IV infusing. Will continue to monitor.

## 2015-06-27 NOTE — Progress Notes (Signed)
Patient ambulated X2 today, 2 assist with wheelchair, 4L of O2, 255ft in the am 300 ft in the afternoon.  Rowe Pavy, RN

## 2015-06-27 NOTE — Care Management Note (Signed)
Case Management Note  Patient Details  Name: [name] [name] MRN: [id number] Date of Birth: 02/12/1934  Subjective/Objective:     Lives at home with wife.  Has a RW at home, which he uses.  Has home oxygen - through [place].  Wife plans on being with him 24/7 on discharge.                 Action/Plan:   Expected Discharge Date:                  Expected Discharge Plan:  Home/Self Care  In-House Referral:     Discharge planning Services  CM Consult  Post Acute Care Choice:    Choice offered to:     DME Arranged:    DME Agency:     HH Arranged:    HH Agency:     Status of Service:  In process, will continue to follow  Medicare Important Message Given:    Date Medicare IM Given:    Medicare IM give by:    Date Additional Medicare IM Given:    Additional Medicare Important Message give by:     If discussed at [place] of Stay Meetings, dates discussed:    Additional Comments:  Vergie Living, RN 06/27/2015, 10:45 AM

## 2015-06-27 NOTE — Progress Notes (Signed)
    Subjective:  Feels well. No CP or dyspnea. Didn't sleep well last night.   Objective:  Vital Signs in the last 24 hours: Temp:  [95.2 F (35.1 C)-98.7 F (37.1 C)] 98.6 F (37 C) (01/11 0408) Pulse Rate:  [54-88] 64 (01/11 0700) Resp:  [11-25] 18 (01/11 0700) BP: (93-138)/(50-101) 130/64 mmHg (01/11 0600) SpO2:  [94 %-100 %] 99 % (01/11 0700) Arterial Line BP: (109-159)/(49-71) 122/62 mmHg (01/10 1700) FiO2 (%):  [40 %-50 %] 40 % (01/10 1400) Weight:  [276 lb 9.6 oz (125.465 kg)] 276 lb 9.6 oz (125.465 kg) (01/11 0500)  Intake/Output from previous day: 01/10 0701 - 01/11 0700 In: 4038.6 [P.O.:240; I.V.:3268.6; NG/GT:30; IV Piggyback:500] Out: 2885 [Urine:2685; Blood:200]  Physical Exam: Pt is alert and oriented, NAD, sitting in chair HEENT: normal Neck: JVP - normal Lungs: coarse BS bilaterally CV: RRR without murmur or gallop Abd: soft, NT, Positive BS, no hepatomegaly Ext: no C/C/E, distal pulses intact and equal, bilateral groin sites clear Skin: warm/dry no rash   Lab Results:  Recent Labs  06/26/15 1110 06/27/15 0438  WBC 6.9 9.1  HGB 11.6* 10.2*  PLT 176 170    Recent Labs  06/26/15 1008 06/26/15 1108 06/27/15 0438  NA 143 143 140  K 3.8 3.9 3.9  CL 101  --  101  CO2  --   --  32  GLUCOSE 185* 177* 88  BUN 9  --  10  CREATININE 0.50*  --  0.68   No results for input(s): TROPONINI in the last 72 hours.  Invalid input(s): CK, MB  Tele: Sinus rhythm, personally reviewed  Assessment/Plan:  1. Severe AS s/p TAVR: stable POD#1 2. Acute on chronic diastolic CHF, related to #1: volume status appears stable this morning. Await CXR and 2D echo. Continue current Rx.  3. Chronic respiratory failure: stable on O2  Dispo: mobilize, 2D echo today, tx 2W, DC foley and cordis sheath   [name], M.D. 06/27/2015, 7:19 AM

## 2015-06-27 NOTE — Progress Notes (Signed)
  Echocardiogram 2D Echocardiogram has been performed.  Darlina Sicilian M 06/27/2015, 12:49 PM

## 2015-06-28 ENCOUNTER — Other Ambulatory Visit: Payer: Self-pay | Admitting: Physician Assistant

## 2015-06-28 ENCOUNTER — Encounter (HOSPITAL_COMMUNITY): Payer: Self-pay | Admitting: Physician Assistant

## 2015-06-28 DIAGNOSIS — I350 Nonrheumatic aortic (valve) stenosis: Secondary | ICD-10-CM

## 2015-06-28 LAB — TYPE AND SCREEN
ABO/RH(D): A POS
ANTIBODY SCREEN: NEGATIVE
UNIT DIVISION: 0
UNIT DIVISION: 0
Unit division: 0
Unit division: 0

## 2015-06-28 LAB — GLUCOSE, CAPILLARY
Glucose-Capillary: 90 mg/dL (ref 65–99)
Glucose-Capillary: 91 mg/dL (ref 65–99)

## 2015-06-28 MED ORDER — METOPROLOL TARTRATE 25 MG PO TABS: 12.5000 mg | ORAL_TABLET | Freq: Two times a day (BID) | ORAL | Status: DC

## 2015-06-28 MED ORDER — FUROSEMIDE 20 MG PO TABS: ORAL_TABLET | ORAL | Status: DC

## 2015-06-28 MED ORDER — CLOPIDOGREL BISULFATE 75 MG PO TABS: 75.0000 mg | ORAL_TABLET | Freq: Every day | ORAL | Status: DC

## 2015-06-28 MED ORDER — CETYLPYRIDINIUM CHLORIDE 0.05 % MT LIQD: 7.0000 mL | Freq: Two times a day (BID) | OROMUCOSAL | Status: DC

## 2015-06-28 MED ORDER — PANTOPRAZOLE SODIUM 40 MG PO TBEC
40.0000 mg | DELAYED_RELEASE_TABLET | Freq: Every day | ORAL | Status: DC
Start: 2015-06-28 — End: 2015-07-30

## 2015-06-28 MED ORDER — FUROSEMIDE 40 MG PO TABS
40.0000 mg | ORAL_TABLET | Freq: Every day | ORAL | Status: DC
  Administered 2015-06-28: 40 mg via ORAL
  Filled 2015-06-28: qty 1

## 2015-06-28 NOTE — Progress Notes (Signed)
      [address]       [place] [address]             [phone]        2 Days Post-Op Procedure(s) (LRB): TRANSCATHETER AORTIC VALVE REPLACEMENT, TRANSFEMORAL (N/A) TRANSESOPHAGEAL ECHOCARDIOGRAM (TEE) (N/A)  Subjective: Patient has no complaints this am  Objective: Vital signs in last 24 hours: Temp:  [97.6 F (36.4 C)-98.7 F (37.1 C)] 97.6 F (36.4 C) (01/12 0601) Pulse Rate:  [36-78] 36 (01/12 0601) Cardiac Rhythm:  [-] Normal sinus rhythm;Bundle branch block (01/11 1928) Resp:  [15-24] 19 (01/12 0601) BP: (87-149)/(55-75) 122/65 mmHg (01/12 0601) SpO2:  [96 %-100 %] 98 % (01/11 2327)   Current Weight  06/27/15 276 lb 9.6 oz (125.465 kg)      Intake/Output from previous day: 01/11 0701 - 01/12 0700 In: 0  Out: 2550 [Urine:2550]   Physical Exam:  Cardiovascular: RRR, no murmur Pulmonary: Diminished but clear to auscultation bilaterally Abdomen: Soft, non tender, bowel sounds present. Extremities: Thickening of skin and discoloration lower legs Wound: Clean and dry.    Lab Results: CBC: Recent Labs  06/26/15 1110  06/26/15 1338 06/27/15 0438  WBC 6.9  --   --  9.1  HGB 11.6*  < > 11.9* 10.2*  HCT 36.9*  < > 35.0* 32.1*  PLT 176  --   --  170  < > = values in this interval not displayed. BMET:  Recent Labs  06/26/15 1338 06/27/15 0438  NA 139 140  K 3.8 3.9  CL 103 101  CO2  --  32  GLUCOSE 126* 88  BUN 16 10  CREATININE 0.90 0.68  CALCIUM  --  8.4*    PT/INR:  Lab Results  Component Value Date   INR 1.39 06/26/2015   INR 1.12 06/22/2015   INR 1.05 05/11/2015   ABG:  INR: Will add last result for INR, ABG once components are confirmed Will add last 4 CBG results once components are confirmed  Assessment/Plan:  1. CV - SR in the 70's. On Lopressor 12.5 mg bid. 2.  Pulmonary - On 4 liters of oxygen via [place]. Continue Symbicort and Spiriva.Encourage incentive spirometer 3.  Acute blood loss anemia - H and H  10.2 and 32.1 4. OSA-CPAP 5. Likely discharge later today  ZIMMERMAN,DONIELLE MPA-C 06/28/2015,7:40 AM

## 2015-06-28 NOTE — Discharge Summary (Signed)
Discharge Summary   Patient ID: [name] [name],  MRN: [id number], DOB/AGE: 03-06-1934 82 y.o.  Admit date: 06/26/2015 Discharge date: 06/28/2015  Primary Care Provider: [name] Primary Cardiologist: Dr. Burt Knack  Discharge Diagnoses Principal Problem:   S/P TAVR (transcatheter aortic valve replacement) Active Problems:   Severe aortic stenosis   Morbid obesity (HCC)   HYPERTENSION, BENIGN ESSENTIAL   COPD (chronic obstructive pulmonary disease) with emphysema (HCC)   HYPERGLYCEMIA   OSA (obstructive sleep apnea)   Allergies Allergies  Allergen Reactions  . Iohexol Hives and Swelling    Pt reports swelling, redness, hives, and blisters   . Atorvastatin     REACTION: aches  . Celecoxib     REACTION: rash  . Clinoril [Sulindac]     REACTION: rash  . Nitrofurantoin   . Sulfa Antibiotics     Intolerant but unrecalled.     Procedures  Echocardiogram 06/27/2015 LV EF: 60% -  65%  ------------------------------------------------------------------- Indications:   Aortic stenosis 424.1.  ------------------------------------------------------------------- History:  PMH:  Dyspnea and murmur. Aortic valve disease. Chronic obstructive pulmonary disease. Risk factors: Former Tobacco Abuse.  ------------------------------------------------------------------- Study Conclusions  - Left ventricle: The cavity size was normal. Wall thickness was increased in a pattern of mild LVH. Systolic function was normal. The estimated ejection fraction was in the range of 60% to 65%. Wall motion was normal; there were no regional wall motion abnormalities. - Aortic valve: S/P TAVR with 26 mm Sapien 3 valve Trivial peri prosthetic regurgiatation. Previous servere AS with mean gradient 51 mmHg gone. There was trivial regurgitation. Valve area (VTI): 1.46 cm^2. Valve area (Vmax): 1.37 cm^2. Valve area (Vmean): 1.38 cm^2. - Mitral valve: Calcified annulus. -  Left atrium: The atrium was mildly dilated. - Atrial septum: A patent foramen ovale cannot be excluded. - Pulmonary arteries: PA peak pressure: 35 mm Hg (S).    TAVR 06/26/2015 Procedure:   Transcatheter Aortic Valve Replacement - Percutaneous Right Transfemoral Approach Edwards Sapien 3 THV (size 26 mm, model # 9600TFX, serial # [id number])  Co-Surgeons:Bryan Alveria Apley, MD and [name], MD  Assistants:Clarence Roxy Manns, MD  Anesthesiologist:Adam Marcie Bal, MD  Echocardiographer:Katarina Meda Coffee, MD  Pre-operative Echo Findings:  severe aortic stenosis  normal left ventricular systolic function   Post-operative Echo Findings:  trivial paravalvular leak  normal left ventricular systolic function   Edwards Sapien 3 transcatheter heart valve (size 26 mm, model #9600TFX, serial [id number]) was prepared and crimped per manufacturer's guidelines, and the proper orientation of the valve is confirmed on the [employer] delivery system.  Echo demostrated acceptable post-procedural gradients, stable mitral valve function, and trivial aortic insufficiency.  The patient tolerated the procedure well and is transported to the surgical intensive care in stable condition. There were no immediate intraoperative complications. All sponge instrument and needle counts are verified correct at completion of the operation.     Hospital Course  [name] is a morbidly obese 82 year old male with past medical history of severe COPD on 4L home oxygen, prior history of smoking, OSA on CPAP and prediabetes. He was recently evaluated by pulmonology for worsening dyspnea. His exam was notable for systolic murmur and echocardiogram demonstrated severe AS with mean gradient of 51 mmHg. He was referred to cardiology service for evaluation. He has chronic lower extremity edema. He  was seen by Dr. Burt Knack on 05/25/2015, it was felt TAVR is the preferred treatment as his comorbid medical condition place him at high risk for conventional surgery. Various options has been discussed with  the patient including palliative medical therapy, conventional surgical aortic valve replacement and transcatheter aortic valve replacement. He is symptomatic with severe left ear. Aortic valve replacement is indicated to improve functional capacity and preventing worsening heart failure and improve mortality. Patient agreed to undergo TAVR. 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 Patient was seen by Dr. Roxy Manns on 06/21/2015 for second surgical opinion. Upon review of all of his preoperative diagnostic tests, he was felt to be a candidate for transcatheter aortic valve replacement using transfemoral approach as alternative to high risk traditional surgery. Various compensation risk has been discussed with the patient, he agreed to proceed with the procedure. Given his reported allergy to IV contrast material, he was given IV steroid on arrival in the morning of surgery.   He presented to [facility] on [facility]2015 and  underwent TAVR with Particia Lather 3 THV (size 26 mm, model # 9600TFX, serial # [id number]). Temporary pacemaker was placed during the procedure, however removed at the end of the procedure. Intraoperative echocardiogram demonstrated acceptable postprocedural gradient, stable mitral valve function, and trivial aortic insufficiency. Complete echocardiogram was obtained on 1/11 which showed EF 60-65%, no regional wall motion abnormality, s/p TAVR with 26 mm Edwards Sapien 3 valve, trivial periprosthetic regurgitation, PA peak pressure 35. He was seen in the morning of 06/27/25, at which time he was doing well without significant discomfort. He is deemed stable for discharge from cardiology perspective. He will remain on 4 L oxygen which is the same at home. Per Dr. Burt Knack, will increase Lasix to 40 mg daily for 3 days then resume home dose of 20 mg daily afterward. He has been placed on Plavix along with aspirin. Metoprolol 12.5 mg twice a day was added to his medical regimen. Given his history of peptic ulcer disease, Protonix 40 mg daily was also added.  I have arranged outpatient basic metabolic panel in one week. Cardiology APP follow-up in one to 2 weeks. He will also follow-up with Dr. Burt Knack on February 13, on which day he will obtain an echocardiogram prior to the follow-up. Prior to discharge, he has been ambulated by cardiac rehabilitation without significant discomfort. Physical exam shows no significant murmur.   Discharge Vitals Blood pressure 122/65, pulse 64, temperature 97.6 F (36.4 C), temperature source Oral, resp. rate 19, height 5\' 10"  (1.778 m), weight 276 lb 9.6 oz (125.465 kg), SpO2 98 %.  Filed Weights   06/26/15 [id number] 06/27/15 0500  Weight: 268 lb (121.564 kg) 276 lb 9.6 oz (125.465 kg)    Labs  CBC  Recent Labs  06/26/15 1110  06/26/15 1338 06/27/15 0438  WBC 6.9  --   --  9.1  HGB 11.6*  < > 11.9* 10.2*  HCT 36.9*  < > 35.0* 32.1*  MCV 96.6  --   --  97.0  PLT 176  --    --  170  < > = values in this interval not displayed. Basic Metabolic Panel  Recent Labs  06/26/15 1338 06/27/15 0438  NA 139 140  K 3.8 3.9  CL 103 101  CO2  --  32  GLUCOSE 126* 88  BUN 16 10  CREATININE 0.90 0.68  CALCIUM  --  8.4*  MG  --  1.5*    Disposition  Pt is being discharged home today in good condition.  Follow-up Plans & Appointments      Follow-up Information    Follow up with Eileen Stanford, PA-C On 07/12/2015.   Specialties:  Cardiology, Radiology   Why:  10:00AM. Cardiology followup   Contact information:   [address] [place] [address] [phone]       Follow up with [facility] On 07/05/2015.   Specialty:  Cardiology   Why:  Obtain 1 week BMET after discharge to check kidney function   Contact information:   [address] [place] [phone]      Follow up with [name], MD On 07/30/2015.   Specialty:  Cardiology   Why:  @8 :30AM obtain repeat echo to see if valve replacement is stable. Appt with Dr. Burt Knack at 9:30AM to discuss echo result and followup   Contact information:   [address] [address] [phone]       Follow up with [name], MD.   Specialty:  Family Medicine   Why:  Followup with your primary care physician as soon as possible.   Contact information:   [address] [address] [phone]       Discharge Medications    Medication List    TAKE these medications        aspirin EC 81 MG tablet  Take 1 tablet (81 mg total) by mouth daily.     clopidogrel 75 MG tablet  Commonly known as:  PLAVIX  Take 1 tablet (75 mg total) by mouth daily with breakfast.     FLUTTER Devi  Use as directed     furosemide 20 MG tablet  Commonly known as:  LASIX  Take 40mg  (2 tablets) for 3 days, then resume 20mg  afterward.     guaiFENesin 600 MG 12 hr tablet  Commonly  known as:  MUCINEX  Take 1,200 mg by mouth 2 (two) times daily.     ibuprofen 200 MG tablet  Commonly known as:  ADVIL,MOTRIN  Take 200 mg by mouth every 6 (six) hours as needed.     LORazepam 1 MG tablet  Commonly known as:  ATIVAN  TAKE 1/4 TO 1/2 TABLET BY MOUTH EVERY 6 HOURS AS NEEDED     metoprolol tartrate 25 MG tablet  Commonly known as:  LOPRESSOR  Take 0.5 tablets (12.5 mg total) by mouth 2 (two) times daily.     OXYGEN  Inhale into the lungs. 4 Liters     pantoprazole 40 MG tablet  Commonly known as:  PROTONIX  Take 1 tablet (40 mg total) by mouth daily.     PROAIR HFA 108 (90 Base) MCG/ACT inhaler  Generic drug:  albuterol  INHALE 1-2 PUFFS INTO THE LUNGS EVERY 6 (SIX) HOURS AS NEEDED FOR WHEEZING.     sodium chloride 0.65 % Soln nasal spray  Commonly known as:  OCEAN  Place 2  sprays into both nostrils.     SYMBICORT 160-4.5 MCG/ACT inhaler  Generic drug:  budesonide-formoterol  INHALE 2 PUFFS BY MOUTH TWICE A DAY     tiotropium 18 MCG inhalation capsule  Commonly known as:  SPIRIVA HANDIHALER  Place 1 capsule (18 mcg total) into inhaler and inhale daily.        Outstanding Labs/Studies  Outpatient BMET and Echo has scheduled above  Duration of Discharge Encounter   Greater than 30 minutes including physician time.  Hilbert Corrigan PA-C Pager: [phone] 06/28/2015, 12:24 PM

## 2015-06-28 NOTE — Discharge Instructions (Signed)
°  Start 40mg  (2 tablets) lasix for 3 days then go back to 20mg  (1 tablet) lasix afterward.    Aortic Valve Replacement  Aortic valve replacement is a procedure to replace an aortic valve that cannot be repaired. An artificial (prosthetic) valve is used to do this. Three types of prosthetic valves are available:   Mechanical valves made entirely from man-made materials.   Donor valves made from human donors. These are only used in special situations.   Biological valves made from animal tissues.  The type of prosthetic valve used will be determined based on various factors, including your age, your lifestyle, and other medical conditions you have.  LET [facility] CARE PROVIDER KNOW ABOUT:  Any allergies you have.  All medicines you are taking, including vitamins, herbs, eye drops, creams, and over-the-counter medicines.  Previous problems you or members of your family have had with the use of anesthetics.  Any blood disorders you have.  Previous surgeries you have had.  Medical conditions you have. RISKS AND COMPLICATIONS Generally, this is a safe procedure. However, as with any procedure, problems can occur. Possible problems include:   Blood clotting caused by the new valve. Replacement with a mechanical valve requires lifelong treatment with medicine to prevent blood clots.   Infection in the new valve.   Valve failure.   Bleeding.   Reaction to anesthetics.  BEFORE THE PROCEDURE  Ask your health care provider about:  Changing or stopping your regular medicines. This is especially important if you are taking diabetes medicines or blood thinners.  Taking medicines such as aspirin and ibuprofen. These medicines can thin your blood. Do not take these medicines before your procedure if your health care provider asks you not to.  Do not eat or drink anything after midnight on the night before the procedure or as directed by your health care provider. PROCEDURE    The surgeon may use either an open technique or a minimally invasive technique for this surgery:  Traditional open surgery  You will be given a medicine that makes you fall asleep (general anesthetic).  You will then be placed on a heart-lung bypass machine. This machine provides oxygen to your blood while the heart is undergoing surgery.  During surgery, the surgeon will make a large cut (incision) in the chest.  The heart will be cooled to slow or stop the heartbeat.  The damaged aortic valve will be removed and replaced with a prosthetic heart valve.  The incision will then be closed with staples or stitches. Minimally invasive surgery This is done through a smaller incision. This still requires general anesthetic and the heart-lung bypass machine. Your heart will be cooled to slow or stop the heartbeat, allowing the damaged valve to be removed and replaced with the new valve. The smaller incision will then be closed. If your condition allows for this procedure, there is often less blood loss, less pain, and faster recovery compared to traditional open surgery.  AFTER THE PROCEDURE You will be monitored closely in a recovery area. From there, you will likely go to an intensive care unit.    This information is not intended to replace advice given to you by your health care provider. Make sure you discuss any questions you have with your health care provider.   Document Released: 10/22/2004 Document Revised: 06/23/2014 Document Reviewed: 03/16/2012 Elsevier Interactive Patient Education [employer].

## 2015-06-28 NOTE — Progress Notes (Signed)
    Subjective:  Feels well. Slept better last night. No CP.  Objective:  Vital Signs in the last 24 hours: Temp:  [97.6 F (36.4 C)-98.7 F (37.1 C)] 97.6 F (36.4 C) (01/12 0601) Pulse Rate:  [36-78] 36 (01/12 0601) Resp:  [15-24] 19 (01/12 0601) BP: (87-149)/(55-75) 122/65 mmHg (01/12 0601) SpO2:  [96 %-100 %] 98 % (01/11 2327)  Intake/Output from previous day: 01/11 0701 - 01/12 0700 In: 0  Out: 2550 [Urine:2550]  Physical Exam: Pt is alert and oriented, NAD HEENT: normal Neck: JVP - normal Lungs: CTA bilaterally CV: RRR without murmur Abd: soft, NT, Positive BS, no hepatomegaly Ext: no C/C/E, chronic stasis changes Skin: warm/dry no rash   Lab Results:  Recent Labs  06/26/15 1110  06/26/15 1338 06/27/15 0438  WBC 6.9  --   --  9.1  HGB 11.6*  < > 11.9* 10.2*  PLT 176  --   --  170  < > = values in this interval not displayed.  Recent Labs  06/26/15 1338 06/27/15 0438  NA 139 140  K 3.8 3.9  CL 103 101  CO2  --  32  GLUCOSE 126* 88  BUN 16 10  CREATININE 0.90 0.68   No results for input(s): TROPONINI in the last 72 hours.  Invalid input(s): CK, MB  Cardiac Studies: 2D Echo: Study Conclusions  - Left ventricle: The cavity size was normal. Wall thickness was increased in a pattern of mild LVH. Systolic function was normal. The estimated ejection fraction was in the range of 60% to 65%. Wall motion was normal; there were no regional wall motion abnormalities. - Aortic valve: S/P TAVR with 26 mm Sapien 3 valve Trivial peri prosthetic regurgiatation. Previous servere AS with mean gradient 51 mmHg gone. There was trivial regurgitation. Valve area (VTI): 1.46 cm^2. Valve area (Vmax): 1.37 cm^2. Valve area (Vmean): 1.38 cm^2. - Mitral valve: Calcified annulus. - Left atrium: The atrium was mildly dilated. - Atrial septum: A patent foramen ovale cannot be excluded. - Pulmonary arteries: PA peak pressure: 35 mm Hg  (S).  Tele: Personally reviewed: sinus rhythm  Assessment/Plan:  1. Severe aortic stenosis: POD #2 from TAVR, mean gradient by post-op echo is 14 mmHg, trivial AI.   2. Acute on chronic diastolic CHF: lasix given yesterday. BP is stable. Will increase lasix to 40 mg daily x 3 days, then resume home dose 20 mg daily.  3. Chronic respiratory failure: on home 02, nocturnal CPAP. Continue same  Dispo: appears stable for discharge today. Await cardiac rehab assessment this am, then likely home.  [name], M.D. 06/28/2015, 7:19 AM

## 2015-06-28 NOTE — Progress Notes (Signed)
CARDIAC REHAB PHASE I   PRE:  Rate/Rhythm: 65 SR BBB  BP:  Supine:   Sitting: 146/76  Standing:    SaO2: 100% 4L  MODE:  Ambulation: 360 ft   POST:  Rate/Rhythm: 87  BP:  Supine:   Sitting: 150/76  Standing:    SaO2: 93% 4L  0946-1030 Pt assisted to bathroom and then he walked 360 ft on 4L with rolling walker and gait belt use. Pt stated he wears 4L all the time and has rollator at home. Mainly walks in house. Encouraged pt to walk as tolerated. Pt declined CRP 2 referral. He does not drive. Encouraged pt to watch his sodium and gave low sodium handouts. Pt stated he weighs every day. Encouraged him to notify cardiologist if he gains 3 pounds overnight or 5 in week. Encouraged IS.  Graylon Good, RN BSN  06/28/2015 10:25 AM

## 2015-06-29 MED FILL — Insulin Regular (Human) Inj 100 Unit/ML: INTRAMUSCULAR | Qty: 250 | Status: AC

## 2015-07-05 ENCOUNTER — Other Ambulatory Visit (INDEPENDENT_AMBULATORY_CARE_PROVIDER_SITE_OTHER): Payer: PPO

## 2015-07-05 DIAGNOSIS — Z01812 Encounter for preprocedural laboratory examination: Secondary | ICD-10-CM

## 2015-07-05 DIAGNOSIS — I10 Essential (primary) hypertension: Secondary | ICD-10-CM

## 2015-07-05 DIAGNOSIS — I350 Nonrheumatic aortic (valve) stenosis: Secondary | ICD-10-CM | POA: Diagnosis not present

## 2015-07-05 LAB — CBC WITH DIFFERENTIAL/PLATELET
BASOS PCT: 1 % (ref 0–1)
Basophils Absolute: 0.1 10*3/uL (ref 0.0–0.1)
EOS ABS: 0.1 10*3/uL (ref 0.0–0.7)
Eosinophils Relative: 2 % (ref 0–5)
HEMATOCRIT: 35.5 % — AB (ref 39.0–52.0)
HEMOGLOBIN: 11.5 g/dL — AB (ref 13.0–17.0)
Lymphocytes Relative: 16 % (ref 12–46)
Lymphs Abs: 1 10*3/uL (ref 0.7–4.0)
MCH: 30 pg (ref 26.0–34.0)
MCHC: 32.4 g/dL (ref 30.0–36.0)
MCV: 92.7 fL (ref 78.0–100.0)
MONO ABS: 0.7 10*3/uL (ref 0.1–1.0)
MONOS PCT: 10 % (ref 3–12)
MPV: 9.3 fL (ref 8.6–12.4)
NEUTROS ABS: 4.6 10*3/uL (ref 1.7–7.7)
NEUTROS PCT: 71 % (ref 43–77)
Platelets: 242 10*3/uL (ref 150–400)
RBC: 3.83 MIL/uL — AB (ref 4.22–5.81)
RDW: 13.7 % (ref 11.5–15.5)
WBC: 6.5 10*3/uL (ref 4.0–10.5)

## 2015-07-05 LAB — BASIC METABOLIC PANEL
BUN: 13 mg/dL (ref 7–25)
CALCIUM: 8.9 mg/dL (ref 8.6–10.3)
CO2: 31 mmol/L (ref 20–31)
CREATININE: 0.66 mg/dL — AB (ref 0.70–1.18)
Chloride: 97 mmol/L — ABNORMAL LOW (ref 98–110)
GLUCOSE: 103 mg/dL — AB (ref 65–99)
Potassium: 4.3 mmol/L (ref 3.5–5.3)
SODIUM: 138 mmol/L (ref 135–146)

## 2015-07-06 ENCOUNTER — Telehealth: Payer: Self-pay | Admitting: *Deleted

## 2015-07-06 LAB — PROTIME-INR
INR: 1.08 (ref <1.50)
Prothrombin Time: 14.1 seconds (ref 11.6–15.2)

## 2015-07-06 NOTE — Telephone Encounter (Signed)
Pt has been notified of lab results by phone with verbal understanding. 

## 2015-07-11 NOTE — Progress Notes (Signed)
Cardiology Office Note   Date:  07/12/2015   ID:  [name] [name], DOB March 25, 1934, MRN [id number]  PCP:  [name], MD  Cardiologist:  Dr. Burt Knack  Post hospital follow up   History of Present Illness: [name] [name] is a 82 y.o. male with a history of morbid obesity, severe COPD on 4L home oxygen, prior history of smoking, OSA on CPAP, severe AS s/p TAVR and prediabetes who presents to clinic for post hospital follow up.   He was recently evaluated by pulmonology for worsening dyspnea. His exam was notable for systolic murmur and echocardiogram demonstrated severe AS with mean gradient of 51 mmHg. He was referred to cardiology service for evaluation. He has chronic lower extremity edema. He was seen by Dr. Burt Knack on 05/25/2015, it was felt TAVR is the preferred treatment as his comorbid medical condition place him at high risk for conventional surgery.    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 Patient was seen by Dr. Roxy Manns on 06/21/2015 for second surgical opinion. Upon review of all of his preoperative diagnostic tests, he was felt to be a candidate for TAVR using transfemoral approach as alternative to high  risk traditional surgery.   He presented to [facility] on 06/26/2015 and underwent TAVR with [name] [name] 3 THV (size 26 mm, model # 9600TFX, serial # [id number]). Complete echocardiogram was obtained on 1/11 which showed EF 60-65%, no regional wall motion abnormality, s/p TAVR with 26 mm Edwards Sapien 3 valve, trivial periprosthetic regurgitation, PA peak pressure 35. He was seen in the morning of 06/27/25 and felt ready for discharge home.  He will remain on 4 L oxygen which is the same at home. Per Dr. Burt Knack, will increase Lasix to 40 mg daily for 3 days then resume home dose of 20 mg daily afterward. He has been placed on Plavix along with aspirin for at least 6 months. Metoprolol 12.5 mg twice a day was added to his medical regimen. Given his history of peptic ulcer disease, Protonix 40 mg daily was also added.  Today he presents to clinic for follow up. No chest pain. He had chronic SOB on PRN home 02 at 4L. No LE edema, orthopnea, PND. He has a little dizziness which is chronic. No syncope. No blood or urine. He has no complaints except for a mild "crick" in his neck which is bothering him.  Here with wife. They are very pleased with recent surgery and he is doing quite well.    Past Medical History  Diagnosis Date  . Prediabetes     Hgb A1C 5.8 in Jan 2017  . COPD (chronic obstructive pulmonary disease) (HCC)     on home O2 4LPM  . Anxiety   . Hemorrhoids   . Diverticulosis   . PUD (  peptic ulcer disease)   . Former smoker     quit 2001  . Right knee DJD 10/31/2011    Severe  . Severe aortic stenosis     a. Echo 11/16:  Mod LVH, EF 55-60%, no RWMA, Gr 1 DD, severe AS (mean 51 mmHg; peak 86 mmHg), MAC, mild LAE  b. s/p TAVR on 06/26/2015 with 70mm Edward Sapien 3 THV  . OSA on CPAP     last study- in the home possibility- 2015, pt. unsure   . Shortness of breath dyspnea   . Kidney stones     passed spontaneously  . AAA (abdominal aortic aneurysm) (HCC)     3.8 cm CTA 05/2015    . S/P TAVR (transcatheter aortic valve replacement) 06/26/2015    26 mm Edwards Sapien 3 transcatheter heart valve placed via percutaneous right transfemoral approach    Past Surgical History  Procedure Laterality Date  . Knee surgery Right 1985    arthrosopic -   . Cholecystectomy    . Tonsillectomy    . Cardiac catheterization N/A 05/15/2015    Procedure: Right/Left Heart Cath and Coronary Angiography;  Surgeon: [name], MD;  Location: [facility];  Service: Cardiovascular;  Laterality: N/A;  . Eye surgery      bilateral cataracts removed, /w IOL  . Transcatheter aortic valve replacement, transfemoral N/A 06/26/2015    Procedure: TRANSCATHETER AORTIC VALVE REPLACEMENT, TRANSFEMORAL;  Surgeon: [name], MD;  Location: [facility];  Service: Open Heart Surgery;  Laterality: N/A;  . Tee without cardioversion N/A 06/26/2015    Procedure: TRANSESOPHAGEAL ECHOCARDIOGRAM (TEE);  Surgeon: [name], MD;  Location: [facility];  Service: Open Heart Surgery;  Laterality: N/A;     Current Outpatient Prescriptions  Medication Sig Dispense Refill  . aspirin EC 81 MG tablet Take 1 tablet (81 mg total) by mouth daily.    . clopidogrel (PLAVIX) 75 MG tablet Take 1 tablet (75 mg total) by mouth daily with breakfast. 90 tablet 3  . furosemide (LASIX) 20 MG tablet Take 40mg  (2 tablets) for 3 days, then resume 20mg  afterward. 30 tablet 6  . guaiFENesin (MUCINEX) 600 MG 12 hr tablet Take 1,200 mg by mouth 2 (two) times daily.     Marland Kitchen ibuprofen (ADVIL,MOTRIN) 200 MG tablet Take 200 mg by mouth every 6 (six) hours as needed (pain).     . LORazepam (ATIVAN) 1 MG tablet TAKE 1/4 TO 1/2 TABLET BY MOUTH EVERY 6 HOURS AS NEEDED 30 tablet 2  . metoprolol tartrate (LOPRESSOR) 25 MG tablet Take 0.5 tablets (12.5 mg total) by mouth 2 (two) times daily. 90 tablet 3  . OXYGEN Inhale into the lungs. 4 Liters    . pantoprazole (PROTONIX) 40 MG tablet Take 1 tablet (40 mg total) by mouth daily. 90 tablet 3  .  PROAIR HFA 108 (90 BASE) MCG/ACT inhaler INHALE 1-2 PUFFS INTO THE LUNGS EVERY 6 (SIX) HOURS AS NEEDED FOR WHEEZING. 8.5 each 3  . Respiratory Therapy Supplies (FLUTTER) DEVI Use as directed 1 each 0  . sodium chloride (OCEAN) 0.65 % SOLN nasal spray Place 2 sprays into both nostrils.    . SYMBICORT 160-4.5 MCG/ACT inhaler INHALE 2 PUFFS BY MOUTH TWICE A DAY 10.2 Inhaler 5  . tiotropium (SPIRIVA HANDIHALER) 18 MCG inhalation capsule Place 1 capsule (18 mcg total) into inhaler and inhale daily. 30 capsule 6   No current facility-administered medications for this visit.    Allergies:   Iohexol; Atorvastatin; Celecoxib; Clinoril;  Nitrofurantoin; and Sulfa antibiotics    Social History:  The patient  reports that he quit smoking about 16 years ago. His smoking use included Cigarettes. He has a 79.5 pack-year smoking history. He has quit using smokeless tobacco. His smokeless tobacco use included Snuff and Chew. He reports that he does not drink alcohol or use illicit drugs.   Family History:  The patient's family history includes Heart attack (age of onset: 21) in his mother; Prostate cancer (age of onset: 73) in his father; Stroke in his mother.    ROS:  Please see the history of present illness.   Otherwise, review of systems are positive for none.   All other systems are reviewed and negative.    PHYSICAL EXAM: VS:  BP 128/86 mmHg  Pulse 62  Ht 5\' 10"  (1.778 m)  Wt 269 lb 3.2 oz (122.108 kg)  BMI 38.63 kg/m2 , BMI Body mass index is 38.63 kg/(m^2). GEN: Well nourished, well developed, in no acute distress HEENT: normal Neck: no JVD, carotid bruits, or masses Cardiac: RRR; + murmur, rubs, or gallops,no edema  Respiratory:  clear to auscultation bilaterally, normal work of breathing GI: soft, nontender, nondistended, + BS MS: no deformity or atrophy Skin: warm and dry, no rash Neuro:  Strength and sensation are intact Psych: euthymic mood, full affect   EKG:  EKG is not ordered  today.    Recent Labs: 04/11/2015: TSH 2.62 06/22/2015: ALT 15* 06/27/2015: Magnesium 1.5* 07/05/2015: BUN 13; Creat 0.66*; Hemoglobin 11.5*; Platelets 242; Potassium 4.3; Sodium 138    Lipid Panel    Component Value Date/Time   CHOL 180 05/04/2014 0855   TRIG 89.0 05/04/2014 0855   HDL 47.00 05/04/2014 0855   CHOLHDL 4 05/04/2014 0855   VLDL 17.8 05/04/2014 0855   LDLCALC 115* 05/04/2014 0855      Wt Readings from Last 3 Encounters:  07/12/15 269 lb 3.2 oz (122.108 kg)  06/27/15 276 lb 9.6 oz (125.465 kg)  06/22/15 268 lb (121.564 kg)      Other studies Reviewed: Additional studies/ records that were reviewed today include: 2D ECHO, TAVR. Review of the above records demonstrates:   Echocardiogram 06/27/2015 LV EF: 60% -  65% Study Conclusions - Left ventricle: The cavity size was normal. Wall thickness was increased in a pattern of mild LVH. Systolic function was normal. The estimated ejection fraction was in the range of 60% to 65%. Wall motion was normal; there were no regional wall motion abnormalities. - Aortic valve: S/P TAVR with 26 mm Sapien 3 valve Trivial peri prosthetic regurgiatation. Previous servere AS with mean gradient 51 mmHg gone. There was trivial regurgitation. Valve area (VTI): 1.46 cm^2. Valve area (Vmax): 1.37 cm^2. Valve area (Vmean): 1.38 cm^2. - Mitral valve: Calcified annulus. - Left atrium: The atrium was mildly dilated. - Atrial septum: A patent foramen ovale cannot be excluded. - Pulmonary arteries: PA peak pressure: 35 mm Hg (S).    TAVR 06/26/2015 Procedure:   Transcatheter Aortic Valve Replacement - Percutaneous Right Transfemoral Approach Edwards Sapien 3 THV (size 26 mm, model # 9600TFX, serial # [id number])  Co-Surgeons:Bryan Alveria Apley, MD and [name], MD  Assistants:Clarence Roxy Manns,  MD  Anesthesiologist:Adam Marcie Bal, MD  Echocardiographer:Katarina Meda Coffee, MD  Pre-operative Echo Findings:  severe aortic stenosis  normal left ventricular systolic function   Post-operative Echo Findings:  trivial paravalvular leak  normal left ventricular systolic function   Edwards Sapien 3 transcatheter heart valve (size 26 mm, model #9600TFX, serial [id number]) was  prepared and crimped per manufacturer's guidelines, and the proper orientation of the valve is confirmed on the [employer] delivery system.  Echo demostrated acceptable post-procedural gradients, stable mitral valve function, and trivial aortic insufficiency.  The patient tolerated the procedure well and is transported to the surgical intensive care in stable condition. There were no immediate intraoperative complications. All sponge instrument and needle counts are verified correct at completion of the operation.           ASSESSMENT AND PLAN:  [name] [name] is a 82 y.o. male with a history of morbid obesity, severe COPD on 4L home oxygen, prior history of smoking, OSA on CPAP, severe AS s/p TAVR and prediabetes who presents to clinic for post hospital follow up.  Severe AS s/p TAVR: patient is doing well. Cont ASA and Plavix for at least 6 months.   HTN: continue Metoprolol 25 mg BID and lasix 20mg  daily.  OSA: cont CPAP  COPD: continue home 02  Morbid obesity: counseled on diet and exercise  Pre-diabetes: HgA1c 5.8. Lifestyle modifications  Peptic ulcer disease: continue PPI  Current medicines are reviewed at length with the patient today.  The patient does not have concerns regarding medicines.  The following changes have been made:  no change  Labs/ tests ordered today include:  No orders of the defined types were placed in this encounter.    Disposition:   FU with Dr. Burt Knack with ECHO in Feb as previously scheduled.    Renea Ee  07/12/2015 10:39 AM    [facility] [place], [place], [place]  [address] Phone: [phone]; Fax: [phone]

## 2015-07-12 ENCOUNTER — Ambulatory Visit (INDEPENDENT_AMBULATORY_CARE_PROVIDER_SITE_OTHER): Payer: PPO | Admitting: Physician Assistant

## 2015-07-12 ENCOUNTER — Encounter: Payer: Self-pay | Admitting: Physician Assistant

## 2015-07-12 VITALS — BP 128/86 | HR 62 | Ht 70.0 in | Wt 269.2 lb

## 2015-07-12 DIAGNOSIS — Z952 Presence of prosthetic heart valve: Secondary | ICD-10-CM

## 2015-07-12 DIAGNOSIS — Z954 Presence of other heart-valve replacement: Secondary | ICD-10-CM

## 2015-07-12 DIAGNOSIS — J449 Chronic obstructive pulmonary disease, unspecified: Secondary | ICD-10-CM | POA: Diagnosis not present

## 2015-07-12 NOTE — Patient Instructions (Signed)
Medication Instructions:  Your physician recommends that you continue on your current medications as directed. Please refer to the Current Medication list given to you today.   Labwork: NONE ORDERED  Testing/Procedures: NONE ORDERED  Follow-Up: KEEP YOUR APPOINTMENTS AS SCHEDULED   Any Other Special Instructions Will Be Listed Below (If Applicable).     If you need a refill on your cardiac medications before your next appointment, please call your pharmacy.

## 2015-07-17 ENCOUNTER — Other Ambulatory Visit: Payer: Self-pay | Admitting: Family Medicine

## 2015-07-17 NOTE — Telephone Encounter (Signed)
Electronic refill request. Last Filled:    30 tablet 2 10/04/2014  Upcoming appt scheduled 08/17/15 CPE.  Please advise.

## 2015-07-18 NOTE — Telephone Encounter (Signed)
Please call in.  Thanks.   

## 2015-07-18 NOTE — Telephone Encounter (Signed)
Rx called to pharmacy as instructed. 

## 2015-07-26 DIAGNOSIS — J449 Chronic obstructive pulmonary disease, unspecified: Secondary | ICD-10-CM | POA: Diagnosis not present

## 2015-07-30 ENCOUNTER — Encounter: Payer: Self-pay | Admitting: Cardiovascular Disease

## 2015-07-30 ENCOUNTER — Ambulatory Visit (INDEPENDENT_AMBULATORY_CARE_PROVIDER_SITE_OTHER): Payer: PPO | Admitting: Cardiovascular Disease

## 2015-07-30 ENCOUNTER — Other Ambulatory Visit: Payer: Self-pay

## 2015-07-30 ENCOUNTER — Ambulatory Visit (HOSPITAL_COMMUNITY): Payer: PPO | Attending: Internal Medicine

## 2015-07-30 VITALS — BP 146/68 | HR 87 | Ht 70.0 in | Wt 260.8 lb

## 2015-07-30 DIAGNOSIS — I5032 Chronic diastolic (congestive) heart failure: Secondary | ICD-10-CM | POA: Diagnosis not present

## 2015-07-30 DIAGNOSIS — I350 Nonrheumatic aortic (valve) stenosis: Secondary | ICD-10-CM | POA: Insufficient documentation

## 2015-07-30 DIAGNOSIS — Z952 Presence of prosthetic heart valve: Secondary | ICD-10-CM

## 2015-07-30 DIAGNOSIS — Z6838 Body mass index (BMI) 38.0-38.9, adult: Secondary | ICD-10-CM | POA: Diagnosis not present

## 2015-07-30 DIAGNOSIS — Z954 Presence of other heart-valve replacement: Secondary | ICD-10-CM | POA: Insufficient documentation

## 2015-07-30 DIAGNOSIS — I517 Cardiomegaly: Secondary | ICD-10-CM | POA: Diagnosis not present

## 2015-07-30 DIAGNOSIS — I359 Nonrheumatic aortic valve disorder, unspecified: Secondary | ICD-10-CM

## 2015-07-30 DIAGNOSIS — I351 Nonrheumatic aortic (valve) insufficiency: Secondary | ICD-10-CM | POA: Diagnosis not present

## 2015-07-30 DIAGNOSIS — Z87891 Personal history of nicotine dependence: Secondary | ICD-10-CM | POA: Diagnosis not present

## 2015-07-30 MED ORDER — RABEPRAZOLE SODIUM 20 MG PO TBEC: 20.0000 mg | DELAYED_RELEASE_TABLET | Freq: Every day | ORAL | Status: DC

## 2015-07-30 NOTE — Patient Instructions (Signed)
Medication Instructions:  Your physician has recommended you make the following change in your medication:  1. STOP Pantoprazole 2. START Aciphex 20mg  take one tablet by mouth daily  Labwork: No new orders.   Testing/Procedures: No new orders.   Follow-Up: Your physician wants you to follow-up in: 6 MONTHS with Dr Burt Knack.  You will receive a reminder letter in the mail two months in advance. If you don't receive a letter, please call our office to schedule the follow-up appointment.    Any Other Special Instructions Will Be Listed Below (If Applicable).     If you need a refill on your cardiac medications before your next appointment, please call your pharmacy.

## 2015-07-30 NOTE — Progress Notes (Signed)
Cardiology Office Note Date:  07/30/2015   ID:  [name] [name], DOB 12-24-1933, MRN [id number]  PCP:  [name], MD  Cardiologist:  [name], MD    Chief Complaint  Patient presents with  . Shortness of Breath   History of Present Illness: [name] [name] is a 82 y.o. male who presents for post-TAVR follow-up. The patient has long-standing pulmonary disease. In December 2016 he had an echocardiogram for evaluation of progressive dyspnea and heart murmur. He was noted to have a mean transaortic valve gradient of 51 mmHg. He underwent further evaluation including cardiac catheterization and CT angiography. He was noted to have a single coronary artery without obstructive disease at cath. After review of all data with the multidisciplinary heart valve team, we proceeded with transfemoral TAVR using a 26 mm transcatheter heart valve on 06/26/2015. She had an uneventful postoperative course and he presents today for follow-up.  He complains of easy bruising ever since he started Plavix. He also complains of increased symptoms of acid reflux. Dyspnea is stable and he is off of oxygen for the first time in a long while. He denies edema, orthopnea, or PND. He's had some right shoulder pain and a sore throat since surgery, but both of these are improving.   Past Medical History  Diagnosis Date  . Prediabetes     Hgb A1C 5.8 in Jan 2017  . COPD (chronic obstructive pulmonary disease) (HCC)     on home O2 4LPM  . Anxiety   . Hemorrhoids   . Diverticulosis   . PUD (peptic ulcer disease)   . Former smoker     quit 2001  . Right knee DJD 10/31/2011    Severe  . Severe aortic stenosis     a. Echo 11/16:  Mod LVH, EF 55-60%, no RWMA, Gr 1 DD, severe AS (mean 51 mmHg; peak 86 mmHg), MAC, mild LAE  b. s/p TAVR on 06/26/2015 with 2mm Edward Sapien 3 THV  . OSA on CPAP     last study- in the home possibility- 2015, pt. unsure   . Shortness of breath dyspnea   . Kidney stones     passed  spontaneously  . AAA (abdominal aortic aneurysm) (HCC)     3.8 cm CTA 05/2015  . S/P TAVR (transcatheter aortic valve replacement) 06/26/2015    26 mm Edwards Sapien 3 transcatheter heart valve placed via percutaneous right transfemoral approach    Past Surgical History  Procedure Laterality Date  . Knee surgery Right 1985    arthrosopic -   . Cholecystectomy    . Tonsillectomy    . Cardiac catheterization N/A 05/15/2015    Procedure: Right/Left Heart Cath and Coronary Angiography;  Surgeon: [name], MD;  Location: [facility];  Service: Cardiovascular;  Laterality: N/A;  . Eye surgery      bilateral cataracts removed, /w IOL  . Transcatheter aortic valve replacement, transfemoral N/A 06/26/2015    Procedure: TRANSCATHETER AORTIC VALVE REPLACEMENT, TRANSFEMORAL;  Surgeon: [name], MD;  Location: [facility];  Service: Open Heart Surgery;  Laterality: N/A;  . Tee without cardioversion N/A 06/26/2015    Procedure: TRANSESOPHAGEAL ECHOCARDIOGRAM (TEE);  Surgeon: [name], MD;  Location: [facility];  Service: Open Heart Surgery;  Laterality: N/A;    Current Outpatient Prescriptions  Medication Sig Dispense Refill  . aspirin EC 81 MG tablet Take 1 tablet (81 mg total) by mouth daily.    . clopidogrel (PLAVIX) 75 MG tablet Take  1 tablet (75 mg total) by mouth daily with breakfast. 90 tablet 3  . furosemide (LASIX) 20 MG tablet Take 20 mg by mouth daily.    Marland Kitchen guaiFENesin (MUCINEX) 600 MG 12 hr tablet Take 1,200 mg by mouth 2 (two) times daily.     Marland Kitchen ibuprofen (ADVIL,MOTRIN) 200 MG tablet Take 200 mg by mouth every 6 (six) hours as needed (pain). Reported on 07/30/2015    . LORazepam (ATIVAN) 1 MG tablet Take 1/4 to 1/2 tablet by mouth every 6 hours as needed for anxiety or sleep.    . metoprolol tartrate (LOPRESSOR) 25 MG tablet Take 0.5 tablets (12.5 mg total) by mouth 2 (two) times daily. 90 tablet 3  . OXYGEN Inhale into the lungs. 4 Liters    . PROAIR HFA 108 (90 BASE)  MCG/ACT inhaler INHALE 1-2 PUFFS INTO THE LUNGS EVERY 6 (SIX) HOURS AS NEEDED FOR WHEEZING. 8.5 each 3  . Respiratory Therapy Supplies (FLUTTER) DEVI Use as directed 1 each 0  . sodium chloride (OCEAN) 0.65 % SOLN nasal spray Place 2 sprays into both nostrils daily as needed for congestion.     . SYMBICORT 160-4.5 MCG/ACT inhaler INHALE 2 PUFFS BY MOUTH TWICE A DAY 10.2 Inhaler 5  . tiotropium (SPIRIVA HANDIHALER) 18 MCG inhalation capsule Place 1 capsule (18 mcg total) into inhaler and inhale daily. 30 capsule 6  . RABEprazole (ACIPHEX) 20 MG tablet Take 1 tablet (20 mg total) by mouth daily. 30 tablet 8   No current facility-administered medications for this visit.    Allergies:   Iohexol; Atorvastatin; Celecoxib; Clinoril; Nitrofurantoin; and Sulfa antibiotics   Social History:  The patient  reports that he quit smoking about 16 years ago. His smoking use included Cigarettes. He has a 79.5 pack-year smoking history. He has quit using smokeless tobacco. His smokeless tobacco use included Snuff and Chew. He reports that he does not drink alcohol or use illicit drugs.   Family History:  The patient's  family history includes Heart attack (age of onset: 42) in his mother; Prostate cancer (age of onset: 64) in his father; Stroke in his mother.    ROS:  Please see the history of present illness.  Otherwise, review of systems is positive for abdominal bloating, rash, dizziness, easy bruising, leg pain, constipation, nausea, balance problems.  All other systems are reviewed and negative.    PHYSICAL EXAM: VS:  BP 146/68 mmHg  Pulse 87  Ht 5\' 10"  (1.778 m)  Wt 118.298 kg (260 lb 12.8 oz)  BMI 37.42 kg/m2 , BMI Body mass index is 37.42 kg/(m^2). GEN: Well nourished, well developed, elderly, obese gentleman in no acute distress HEENT: normal Neck: no JVD, no masses. No carotid bruits Cardiac: RRR with a 2/6 systolic ejection murmur at the right upper sternal border           Respiratory:  clear  to auscultation bilaterally, normal work of breathing GI: soft, nontender, nondistended, + BS MS: no deformity or atrophy Ext: 1+ pretibial edema with chronic stasis changes, pedal pulses 2+= bilaterally Skin: warm and dry, no rash Neuro:  Strength and sensation are intact Psych: euthymic mood, full affect  EKG:  EKG is not ordered today.  Recent Labs: 04/11/2015: TSH 2.62 06/22/2015: ALT 15* 06/27/2015: Magnesium 1.5* 07/05/2015: BUN 13; Creat 0.66*; Hemoglobin 11.5*; Platelets 242; Potassium 4.3; Sodium 138   Lipid Panel     Component Value Date/Time   CHOL 180 05/04/2014 0855   TRIG 89.0 05/04/2014 0855  HDL 47.00 05/04/2014 0855   CHOLHDL 4 05/04/2014 0855   VLDL 17.8 05/04/2014 0855   LDLCALC 115* 05/04/2014 0855      Wt Readings from Last 3 Encounters:  07/30/15 118.298 kg (260 lb 12.8 oz)  07/12/15 122.108 kg (269 lb 3.2 oz)  06/27/15 125.465 kg (276 lb 9.6 oz)    ASSESSMENT AND PLAN: 1.  Aortic valve disease status post TAVR: Patient with NYHA 2 symptoms. Difficult to discern etiology of shortness of breath considering his chronic lung disease. He tolerated TAVR well and had no immediate complications. I personally reviewed his echo images from this morning study. Formal interpretation is pending. I do not appreciate any paravalvular regurgitation. The peak systolic velocity through the aortic valve is less than 3 m/s. LV systolic function appears normal. Patient should continue aspirin and clopidogrel for a period of 6 months, then can continue on aspirin 81 mg long-term.  2. Gastroesophageal reflux disease: Patient is having breakthrough symptoms on Protonix. He recalls a better therapeutic effect from AcipHex. A prescription is written today. He was advised to stop Protonix.  3. Chronic diastolic heart failure: Advised to continue furosemide 20 mg daily. No evidence of volume overload on exam.  Current medicines are reviewed with the patient today.  The patient does  not have concerns regarding medicines.  Labs/ tests ordered today include:  No orders of the defined types were placed in this encounter.   Disposition:   FU 6 months  Signed, [name], MD  07/30/2015 2:21 PM    [facility] [place], [place], [place]  [address] Phone: [phone]; Fax: [phone]

## 2015-08-01 ENCOUNTER — Other Ambulatory Visit: Payer: Self-pay | Admitting: *Deleted

## 2015-08-01 MED ORDER — BUDESONIDE-FORMOTEROL FUMARATE 160-4.5 MCG/ACT IN AERO: 2.0000 | INHALATION_SPRAY | Freq: Two times a day (BID) | RESPIRATORY_TRACT | Status: DC

## 2015-08-06 ENCOUNTER — Telehealth: Payer: Self-pay | Admitting: Cardiovascular Disease

## 2015-08-06 NOTE — Telephone Encounter (Signed)
SPOKE WITH PT'S WIFE   AS  STATED  AND PT  HAS  HAD PREVIOUS NOSE BLEED  AND  ANOTHER   EPISODE  THAT  WAS NOT MENTIONED  BUT  NOT  AS BAD. DISCUSSED WITH  DR  Burt Knack  AS  WELL AS  SALLY  EARL  PER  DR  COOPER  PT  MAY HOLD PLAVIX  FOR   4-5 DAYS AND  USE  NASAL  SALINE  AS  DIRECTED  TO  MOISTEN  NASAL MEMBRANES  AND  MAY  USE  AFRIN  AS  NEEDED  TO STOP  NOSE  BLEEDS   PER SALLY  AND  DR  COOPER  ALSO  SUGGESTS   PMD  REFER   PT  TO  ENT  . PT'S WIFE  VERBALIZED UNDERSTANDING .Adonis Housekeeper

## 2015-08-06 NOTE — Telephone Encounter (Signed)
New message     Pt has had a nose bleed for at least 1 hr now and about 2hrs yesterday.  The fire dept came out this am.  His bp was 133/72.  Pt is on generic plavix. Please call

## 2015-08-07 ENCOUNTER — Telehealth: Payer: Self-pay | Admitting: Pulmonary Disease

## 2015-08-07 NOTE — Telephone Encounter (Signed)
Called spoke with pt. He reports his CPAP machine does not have a card in it.  He is going to bring in his CPAP machine. Nothing further needed

## 2015-08-08 ENCOUNTER — Encounter: Payer: Self-pay | Admitting: Pulmonary Disease

## 2015-08-08 ENCOUNTER — Ambulatory Visit (INDEPENDENT_AMBULATORY_CARE_PROVIDER_SITE_OTHER): Payer: PPO | Admitting: Pulmonary Disease

## 2015-08-08 VITALS — BP 128/86 | HR 80 | Ht 70.0 in | Wt 274.0 lb

## 2015-08-08 DIAGNOSIS — J9611 Chronic respiratory failure with hypoxia: Secondary | ICD-10-CM | POA: Diagnosis not present

## 2015-08-08 DIAGNOSIS — J449 Chronic obstructive pulmonary disease, unspecified: Secondary | ICD-10-CM | POA: Diagnosis not present

## 2015-08-08 DIAGNOSIS — G4733 Obstructive sleep apnea (adult) (pediatric): Secondary | ICD-10-CM | POA: Diagnosis not present

## 2015-08-08 NOTE — Patient Instructions (Signed)
Will arrange for new CPAP machine  Call if you decide to get set up with rehab classes  Follow up in 6 months

## 2015-08-09 ENCOUNTER — Other Ambulatory Visit (INDEPENDENT_AMBULATORY_CARE_PROVIDER_SITE_OTHER): Payer: PPO

## 2015-08-09 DIAGNOSIS — I10 Essential (primary) hypertension: Secondary | ICD-10-CM

## 2015-08-09 LAB — BASIC METABOLIC PANEL
BUN: 12 mg/dL (ref 6–23)
CALCIUM: 9.3 mg/dL (ref 8.4–10.5)
CO2: 37 mEq/L — ABNORMAL HIGH (ref 19–32)
CREATININE: 0.81 mg/dL (ref 0.40–1.50)
Chloride: 99 mEq/L (ref 96–112)
GFR: 97.47 mL/min (ref >60.00)
GLUCOSE: 109 mg/dL — AB (ref 70–99)
POTASSIUM: 4.6 meq/L (ref 3.5–5.1)
Sodium: 141 mEq/L (ref 135–145)

## 2015-08-09 LAB — LIPID PANEL
Cholesterol: 173 mg/dL (ref 0–200)
HDL: 48.5 mg/dL (ref >39.00)
LDL Cholesterol: 107 mg/dL — ABNORMAL HIGH (ref 0–99)
NONHDL: 124.53
Total CHOL/HDL Ratio: 4
Triglycerides: 90 mg/dL (ref 0.0–149.0)
VLDL: 18 mg/dL (ref 0.0–40.0)

## 2015-08-12 DIAGNOSIS — J449 Chronic obstructive pulmonary disease, unspecified: Secondary | ICD-10-CM | POA: Diagnosis not present

## 2015-08-14 NOTE — Progress Notes (Signed)
Current Outpatient Prescriptions on File Prior to Visit  Medication Sig  . aspirin EC 81 MG tablet Take 1 tablet (81 mg total) by mouth daily.  . budesonide-formoterol (SYMBICORT) 160-4.5 MCG/ACT inhaler Inhale 2 puffs into the lungs 2 (two) times daily.  . furosemide (LASIX) 20 MG tablet Take 20 mg by mouth daily.  Marland Kitchen guaiFENesin (MUCINEX) 600 MG 12 hr tablet Take 1,200 mg by mouth 2 (two) times daily.   Marland Kitchen ibuprofen (ADVIL,MOTRIN) 200 MG tablet Take 200 mg by mouth every 6 (six) hours as needed (pain). Reported on 07/30/2015  . LORazepam (ATIVAN) 1 MG tablet Take 1/4 to 1/2 tablet by mouth every 6 hours as needed for anxiety or sleep.  . metoprolol tartrate (LOPRESSOR) 25 MG tablet Take 0.5 tablets (12.5 mg total) by mouth 2 (two) times daily.  . OXYGEN Inhale into the lungs. 4 Liters  . PROAIR HFA 108 (90 BASE) MCG/ACT inhaler INHALE 1-2 PUFFS INTO THE LUNGS EVERY 6 (SIX) HOURS AS NEEDED FOR WHEEZING.  . RABEprazole (ACIPHEX) 20 MG tablet Take 1 tablet (20 mg total) by mouth daily.  Marland Kitchen Respiratory Therapy Supplies (FLUTTER) DEVI Use as directed  . sodium chloride (OCEAN) 0.65 % SOLN nasal spray Place 2 sprays into both nostrils daily as needed for congestion.   Marland Kitchen tiotropium (SPIRIVA HANDIHALER) 18 MCG inhalation capsule Place 1 capsule (18 mcg total) into inhaler and inhale daily.  . clopidogrel (PLAVIX) 75 MG tablet Take 1 tablet (75 mg total) by mouth daily with breakfast. (Patient not taking: Reported on 08/08/2015)   No current facility-administered medications on file prior to visit.     Chief Complaint  Patient presents with  . Follow-up    Wears CPAP nightly. Pt needs new mask/fitting. Cenies any issues with pressure setting. DME took SD card out of machine and never returned it to the patient--unable to get download today. Pt c/o frequent bad nosebleeds and druy mouth. DME: [employer]. ; Getting flu vaccine at PCP     Tests PSG 12/13/99 >> AHI 33 Spirometry 04/11/15 >> FEV1 0.77 (24%),  FEV1% 50 Rt/Lt heart cath 05/15/15 >> severe AS, mild pulmonary hypertension CT chest 05/22/15 >> 4 mm RUL nodule Echo 07/30/15 >> mild LVH, EF 55 to [id number], grade 1 diastolic dysfx  Past medical hx Anxiety, PUD, Diverticulosis, Nephrolithiasis, AAA, severe AS s/p TAVR January 2017  Past surgical hx, Allergies, Family hx, Social hx all reviewed.  Vital Signs BP 128/86 mmHg  Pulse 80  Ht 5\' 10"  (1.778 m)  Wt 274 lb (124.286 kg)  BMI 39.32 kg/m2  SpO2 93%  History of Present Illness [name] [name] is a 82 y.o. male former smoker with OSA, COPD, and chronic respiratory failure.  He was previously followed by Dr. [name].  He has been doing well with CPAP.  He doesn't have any issues with mask fit.  His CPAP machine is more than 80 years old.  He feels his CPAP is making him too dry >> wasn't like this before.  His breathing has improved some since he had TAVR.  He is not having cough, wheeze, or sputum.  He is using symbicort and spiriva.  He doesn't need to use proair much.  He denies chest pain, fever, hemoptysis, or leg swelling.  He is not very active.  He gets winded if he does too much >> as a result he seems to limit his activities.  Physical Exam  General - No distress, wearing oxygen ENT - No sinus tenderness, no  oral exudate, no LAN Cardiac - s1s2 regular, 2/6 murmur Chest - No wheeze/rales/dullness Back - No focal tenderness Abd - Soft, non-tender Ext - No edema Neuro - Normal strength Skin - No rashes Psych - normal mood, and behavior   Assessment/Plan  COPD. Plan: - continue spiriva, symbicort, and prn albuterol - he will call if he decides to enroll in pulmonary rehab  Obstructive sleep apnea. Plan: - his current machine is more than 80 years old >> will arrange for new machine - will get copy of his download after he gets new machine  Chronic respiratory failure with hypoxia. Plan: - continue supplemental oxygen    Patient Instructions  Will arrange  for new CPAP machine  Call if you decide to get set up with rehab classes  Follow up in 6 months     Chesley Mires, MD [place] Pager:  [phone]

## 2015-08-17 ENCOUNTER — Ambulatory Visit: Payer: PPO

## 2015-08-17 ENCOUNTER — Encounter: Payer: PPO | Admitting: Family Medicine

## 2015-08-23 DIAGNOSIS — J449 Chronic obstructive pulmonary disease, unspecified: Secondary | ICD-10-CM | POA: Diagnosis not present

## 2015-08-24 DIAGNOSIS — G4733 Obstructive sleep apnea (adult) (pediatric): Secondary | ICD-10-CM | POA: Diagnosis not present

## 2015-08-24 DIAGNOSIS — J449 Chronic obstructive pulmonary disease, unspecified: Secondary | ICD-10-CM | POA: Diagnosis not present

## 2015-09-09 DIAGNOSIS — J449 Chronic obstructive pulmonary disease, unspecified: Secondary | ICD-10-CM | POA: Diagnosis not present

## 2015-09-14 ENCOUNTER — Encounter: Payer: Self-pay | Admitting: Family Medicine

## 2015-09-14 ENCOUNTER — Ambulatory Visit (INDEPENDENT_AMBULATORY_CARE_PROVIDER_SITE_OTHER): Payer: PPO | Admitting: Family Medicine

## 2015-09-14 ENCOUNTER — Ambulatory Visit (INDEPENDENT_AMBULATORY_CARE_PROVIDER_SITE_OTHER): Payer: PPO

## 2015-09-14 VITALS — BP 124/78 | HR 65 | Temp 98.3°F | Ht 70.0 in | Wt 277.2 lb

## 2015-09-14 VITALS — BP 124/78 | HR 65 | Temp 98.3°F | Wt 277.2 lb

## 2015-09-14 DIAGNOSIS — J9610 Chronic respiratory failure, unspecified whether with hypoxia or hypercapnia: Secondary | ICD-10-CM

## 2015-09-14 DIAGNOSIS — Z952 Presence of prosthetic heart valve: Secondary | ICD-10-CM

## 2015-09-14 DIAGNOSIS — Z0000 Encounter for general adult medical examination without abnormal findings: Secondary | ICD-10-CM | POA: Diagnosis not present

## 2015-09-14 DIAGNOSIS — R8299 Other abnormal findings in urine: Secondary | ICD-10-CM

## 2015-09-14 DIAGNOSIS — Z954 Presence of other heart-valve replacement: Secondary | ICD-10-CM | POA: Diagnosis not present

## 2015-09-14 DIAGNOSIS — R82998 Other abnormal findings in urine: Secondary | ICD-10-CM | POA: Insufficient documentation

## 2015-09-14 DIAGNOSIS — R152 Fecal urgency: Secondary | ICD-10-CM | POA: Diagnosis not present

## 2015-09-14 MED ORDER — POLYETHYLENE GLYCOL 3350 17 GM/SCOOP PO POWD: 17.0000 g | Freq: Every day | ORAL | Status: DC | PRN

## 2015-09-14 NOTE — Assessment & Plan Note (Signed)
Appears euvolemic and okay to inc his fluid intake, d/w pt.

## 2015-09-14 NOTE — Progress Notes (Signed)
Subjective:   [name] [name] is a 82 y.o. male who presents for Medicare Annual/Subsequent preventive examination.  Cardiac Risk Factors include: advanced age (>56men, >47 women);hypertension;obesity (BMI >30kg/m2);sedentary lifestyle     Objective:    Vitals: BP 124/78 mmHg  Pulse 65  Temp(Src) 98.3 F (36.8 C) (Oral)  Ht 5\' 10"  (1.778 m)  Wt 277 lb 4 oz (125.76 kg)  BMI 39.78 kg/m2  SpO2 90%  Body mass index is 39.78 kg/(m^2).  Tobacco History  Smoking status  . Former Smoker -- 1.50 packs/day for 53 years  . Types: Cigarettes  . Quit date: 06/23/1999  Smokeless tobacco  . Former Systems developer  . Types: Snuff, Chew     Counseling given: No   Past Medical History  Diagnosis Date  . Prediabetes     Hgb A1C 5.8 in Jan 2017  . COPD (chronic obstructive pulmonary disease) (HCC)     on home O2 4LPM  . Anxiety   . Hemorrhoids   . Diverticulosis   . PUD (peptic ulcer disease)   . Former smoker     quit 2001  . Right knee DJD 10/31/2011    Severe  . Severe aortic stenosis     a. Echo 11/16:  Mod LVH, EF 55-60%, no RWMA, Gr 1 DD, severe AS (mean 51 mmHg; peak 86 mmHg), MAC, mild LAE  b. s/p TAVR on 06/26/2015 with 57mm Edward Sapien 3 THV  . OSA on CPAP     last study- in the home possibility- 2015, pt. unsure   . Shortness of breath dyspnea   . Kidney stones     passed spontaneously  . AAA (abdominal aortic aneurysm) (HCC)     3.8 cm CTA 05/2015  . S/P TAVR (transcatheter aortic valve replacement) 06/26/2015    26 mm Edwards Sapien 3 transcatheter heart valve placed via percutaneous right transfemoral approach   Past Surgical History  Procedure Laterality Date  . Knee surgery Right 1985    arthrosopic -   . Cholecystectomy    . Tonsillectomy    . Cardiac catheterization N/A 05/15/2015    Procedure: Right/Left Heart Cath and Coronary Angiography;  Surgeon: [name], MD;  Location: [facility];  Service: Cardiovascular;  Laterality: N/A;  . Eye surgery     bilateral cataracts removed, /w IOL  . Transcatheter aortic valve replacement, transfemoral N/A 06/26/2015    Procedure: TRANSCATHETER AORTIC VALVE REPLACEMENT, TRANSFEMORAL;  Surgeon: [name], MD;  Location: [facility];  Service: Open Heart Surgery;  Laterality: N/A;  . Tee without cardioversion N/A 06/26/2015    Procedure: TRANSESOPHAGEAL ECHOCARDIOGRAM (TEE);  Surgeon: [name], MD;  Location: [facility];  Service: Open Heart Surgery;  Laterality: N/A;   Family History  Problem Relation Age of Onset  . Stroke Mother   . Heart attack Mother 69    deceased  . Prostate cancer Father 59  . Colon cancer Neg Hx    History  Sexual Activity  . Sexual Activity: No    Outpatient Encounter Prescriptions as of 09/14/2015  Medication Sig  . aspirin EC 81 MG tablet Take 1 tablet (81 mg total) by mouth daily.  . budesonide-formoterol (SYMBICORT) 160-4.5 MCG/ACT inhaler Inhale 2 puffs into the lungs 2 (two) times daily.  . clopidogrel (PLAVIX) 75 MG tablet Take 1 tablet (75 mg total) by mouth daily with breakfast.  . furosemide (LASIX) 20 MG tablet Take 20 mg by mouth daily.  Marland Kitchen guaiFENesin (MUCINEX) 600 MG 12 hr  tablet Take 600 mg by mouth 2 (two) times daily.   Marland Kitchen ibuprofen (ADVIL,MOTRIN) 200 MG tablet Take 200 mg by mouth every 6 (six) hours as needed (pain). Reported on 07/30/2015  . LORazepam (ATIVAN) 1 MG tablet Take 1/4 to 1/2 tablet by mouth every 6 hours as needed for anxiety or sleep.  . metoprolol tartrate (LOPRESSOR) 25 MG tablet Take 0.5 tablets (12.5 mg total) by mouth 2 (two) times daily.  . OXYGEN Inhale into the lungs. 4 Liters  . PROAIR HFA 108 (90 BASE) MCG/ACT inhaler INHALE 1-2 PUFFS INTO THE LUNGS EVERY 6 (SIX) HOURS AS NEEDED FOR WHEEZING.  . RABEprazole (ACIPHEX) 20 MG tablet Take 1 tablet (20 mg total) by mouth daily.  Marland Kitchen Respiratory Therapy Supplies (FLUTTER) DEVI Use as directed  . sodium chloride (OCEAN) 0.65 % SOLN nasal spray Place 2 sprays into both nostrils daily as  needed for congestion.   Marland Kitchen tiotropium (SPIRIVA HANDIHALER) 18 MCG inhalation capsule Place 1 capsule (18 mcg total) into inhaler and inhale daily.   No facility-administered encounter medications on file as of 09/14/2015.    Activities of Daily Living In your present state of health, do you have any difficulty performing the following activities: 09/14/2015 06/27/2015  Hearing? N N  Vision? N N  Difficulty concentrating or making decisions? Y N  Walking or climbing stairs? Y Y  Dressing or bathing? N N  Doing errands, shopping? Y N  Preparing Food and eating ? N -  Using the Toilet? N -  In the past six months, have you accidently leaked urine? Y -  Do you have problems with loss of bowel control? Y -  Managing your Medications? N -  Managing your Finances? Y -  Housekeeping or managing your Housekeeping? Y -    Patient Care Team: Tonia Ghent, MD as PCP - General (Family Medicine) Noralee Space, MD as Consulting Physician (Pulmonary Disease) Thayer Headings, MD as Consulting Physician (Cardiology)   Assessment:     Hearing Screening   125Hz  250Hz  500Hz  1000Hz  2000Hz  4000Hz  8000Hz   Right ear:   40 40 40 0   Left ear:   40 40 40 0   Vision Screening Comments: Last eye exam was within previous 2 yrs with Dr. Chong Sicilian  Exercise Activities and Dietary recommendations Current Exercise Habits: Home exercise routine, Type of exercise: Other - see comments (bed exercises; stretching), Time (Minutes): 30, Frequency (Times/Week): 4, Weekly Exercise (Minutes/Week): 120, Intensity: Mild, Exercise limited by: orthopedic condition(s);respiratory conditions(s)  Fall Risk Fall Risk  09/14/2015 09/14/2015 05/18/2014 04/07/2013  Falls in the past year? Yes Yes No Yes  Number falls in past yr: 1 1 - 1  Injury with Fall? No No - Yes  Follow up Falls evaluation completed;Education provided;Falls prevention discussed Falls evaluation completed;Education provided;Falls prevention discussed - -    Depression Screen PHQ 2/9 Scores 09/14/2015 09/14/2015 05/18/2014 04/07/2013  PHQ - 2 Score 0 0 0 0    Cognitive Testing MMSE - Mini Mental State Exam 09/14/2015  Orientation to time 5  Orientation to Place 5  Registration 3  Attention/ Calculation 0  Recall 3  Language- name 2 objects 0  Language- repeat 1  Language- follow 3 step command 3  Language- read & follow direction 0  Write a sentence 0  Copy design 0  Total score 20   PLEASE NOTE: A Mini-Cog screen was completed. Maximum score is 20. A value of 0 denotes this part of [facility] MMSE  was not completed.  Orientation to Time - Max 5 Orientation to Place - Max 5 Registration - Max 3 Recall - Max 3 Language Repeat - Max 1 Language Follow 3 Step Command - Max 3   Immunization History  Administered Date(s) Administered  . Influenza Split 06/17/2011, 02/23/2012  . Influenza Whole 04/27/2006, 04/10/2009, 04/10/2010  . Influenza,inj,Quad PF,36+ Mos 03/17/2013, 04/24/2014  . Pneumococcal Polysaccharide-23 02/15/1999, 10/20/2011  . Td 02/05/2006  . Zoster 01/15/2009   Screening Tests Health Maintenance  Topic Date Due  . COLONOSCOPY - will discuss at CPE 03/22/2012  . INFLUENZA VACCINE - declined 01/15/2015  . TETANUS/TDAP  02/06/2016  . ZOSTAVAX  Completed  . PNA vac Low Risk Adult - administered Completed      Plan:     I have personally reviewed and addressed the Medicare Annual Wellness questionnaire and have noted the following in the patient's chart:  A. Medical and social history B. Use of alcohol, tobacco or illicit drugs  C. Current medications and supplements D. Functional ability and status E.  Nutritional status F.  Physical activity G. Advance directives H. List of other physicians I.  Hospitalizations, surgeries, and ER visits in previous 12 months J.  [place] to include hearing, vision, cognitive, depression L. Referrals and appointments - none  In addition, I have reviewed  and discussed with patient certain preventive protocols, quality metrics, and best practice recommendations. A written personalized care plan for preventive services as well as general preventive health recommendations were provided to patient.  See attached scanned questionnaire for additional information.   Signed,   Lindell Noe, MHA, BS, LPN Health Advisor [id number]  Lindell Noe, LPN  [id number]  I reviewed health advisor's note, was available for consultation on the day of service listed in this note, and agree with documentation and plan. [name], MD.

## 2015-09-14 NOTE — Assessment & Plan Note (Signed)
Okay to add on miralax.  He has some hard stools and this may help with regularity.  Okay to defer colon cancer screening for now given his age and other conditions.

## 2015-09-14 NOTE — Progress Notes (Signed)
Pre visit review using our clinic review tool, if applicable. No additional management support is needed unless otherwise documented below in the visit note.  S/p TAVR.  SOB improved, now on O2 episodically.  No CP.  Breathing better.  No fevers, no sputum.  Hospital course d/w pt, re: TAVR procedure.    Fecal urgency but not able to have a BM.  Will still feel the need to have a BM, after a successful BM.  Not on a bowel regimen.  No blood in stool.  No black stools.  occ hard stools.    No dysuria.  Darker urine noted.  D/w pt about inc in his fluid intake.    PMH and SH reviewed  ROS: See HPI, otherwise noncontributory.  Meds, vitals, and allergies reviewed.   GEN: nad, alert and oriented, off O2 at rest HEENT: mucous membranes moist NECK: supple w/o LA CV: rrr.  Soft murmur noted, as expected.  PULM: ctab, no inc wob ABD: soft, +bs EXT: no edema SKIN: no acute rash but chronic skin changes noted on the BLE.  Mildly irritated SKs noted on the L upper chest.

## 2015-09-14 NOTE — Patient Instructions (Addendum)
[name] , Thank you for taking time to come for your Medicare Wellness Visit. I appreciate your ongoing commitment to your health goals. Please review the following plan we discussed and let me know if I can assist you in the future.   These are the goals we discussed: Goals    None      This is a list of the screening recommended for you and due dates:  Health Maintenance  Topic Date Due  . Colon Cancer Screening  03/22/2012  . Flu Shot  01/15/2015  . Tetanus Vaccine  02/06/2016  . Shingles Vaccine  Completed  . Pneumonia vaccines  Completed    Preventive Care for Adults  A healthy lifestyle and preventive care can promote health and wellness. Preventive health guidelines for adults include the following key practices.  . A routine yearly physical is a good way to check with your health care provider about your health and preventive screening. It is a chance to share any concerns and updates on your health and to receive a thorough exam.  . Visit your dentist for a routine exam and preventive care every 6 months. Brush your teeth twice a day and floss once a day. Good oral hygiene prevents tooth decay and gum disease.  . The frequency of eye exams is based on your age, health, family medical history, use  of contact lenses, and other factors. Follow your health care provider's ecommendations for frequency of eye exams.  . Eat a healthy diet. Foods like vegetables, fruits, whole grains, low-fat dairy products, and lean protein foods contain the nutrients you need without too many calories. Decrease your intake of foods high in solid fats, added sugars, and salt. Eat the right amount of calories for you. Get information about a proper diet from your health care provider, if necessary.  . Regular physical exercise is one of the most important things you can do for your health. Most adults should get at least 150 minutes of moderate-intensity exercise (any activity that increases your  heart rate and causes you to sweat) each week. In addition, most adults need muscle-strengthening exercises on 2 or more days a week.  Silver Sneakers may be a benefit available to you. To determine eligibility, you may visit the website: www.silversneakers.com or contact program at 2187902515 Mon-Fri between 8AM-8PM.   . Maintain a healthy weight. The body mass index (BMI) is a screening tool to identify possible weight problems. It provides an estimate of body fat based on height and weight. Your health care provider can find your BMI and can help you achieve or maintain a healthy weight.   For adults 20 years and older: ? A BMI below 18.5 is considered underweight. ? A BMI of 18.5 to 24.9 is normal. ? A BMI of 25 to 29.9 is considered overweight. ? A BMI of 30 and above is considered obese.   . Maintain normal blood lipids and cholesterol levels by exercising and minimizing your intake of saturated fat. Eat a balanced diet with plenty of fruit and vegetables. Blood tests for lipids and cholesterol should begin at age 28 and be repeated every 5 years. If your lipid or cholesterol levels are high, you are over 50, or you are at high risk for heart disease, you may need your cholesterol levels checked more frequently. Ongoing high lipid and cholesterol levels should be treated with medicines if diet and exercise are not working.  . If you smoke, find out from your health  care provider how to quit. If you do not use tobacco, please do not start.  . If you choose to drink alcohol, please do not consume more than 2 drinks per day. One drink is considered to be 12 ounces (355 mL) of beer, 5 ounces (148 mL) of wine, or 1.5 ounces (44 mL) of liquor.  . If you are 49-66 years old, ask your health care provider if you should take aspirin to prevent strokes.  . Use sunscreen. Apply sunscreen liberally and repeatedly throughout the day. You should seek shade when your shadow is shorter than you.  Protect yourself by wearing long sleeves, pants, a wide-brimmed hat, and sunglasses year round, whenever you are outdoors.  . Once a month, do a whole body skin exam, using a mirror to look at the skin on your back. Tell your health care provider of new moles, moles that have irregular borders, moles that are larger than a pencil eraser, or moles that have changed in shape or color.  Increase water intake and add on miralax to help with bowel movements.  Update me as needed.  Take care.  Glad to see you.

## 2015-09-14 NOTE — Assessment & Plan Note (Addendum)
Improved with less need for O2, able to tolerate RA at rest.  D/w pt.  Continue as is.  No change in inhalers. >25 minutes spent in face to face time with patient, >50% spent in counselling or coordination of care.

## 2015-09-14 NOTE — Progress Notes (Signed)
Pre visit review using our clinic review tool, if applicable. No additional management support is needed unless otherwise documented below in the visit note. 

## 2015-09-14 NOTE — Patient Instructions (Signed)
Mr. Jansma , Thank you for taking time to come for your Medicare Wellness Visit. I appreciate your ongoing commitment to your health goals. Please review the following plan we discussed and let me know if I can assist you in the future.    This is a list of the screening recommended for you and due dates:  Health Maintenance  Topic Date Due  . Colon Cancer Screening  03/22/2012  . Flu Shot  01/15/2015  . Tetanus Vaccine  02/06/2016  . Shingles Vaccine  Completed  . Pneumonia vaccines  Completed    Preventive Care for Adults  A healthy lifestyle and preventive care can promote health and wellness. Preventive health guidelines for adults include the following key practices.  . A routine yearly physical is a good way to check with your health care provider about your health and preventive screening. It is a chance to share any concerns and updates on your health and to receive a thorough exam.  . Visit your dentist for a routine exam and preventive care every 6 months. Brush your teeth twice a day and floss once a day. Good oral hygiene prevents tooth decay and gum disease.  . The frequency of eye exams is based on your age, health, family medical history, use  of contact lenses, and other factors. Follow your health care provider's ecommendations for frequency of eye exams.  . Eat a healthy diet. Foods like vegetables, fruits, whole grains, low-fat dairy products, and lean protein foods contain the nutrients you need without too many calories. Decrease your intake of foods high in solid fats, added sugars, and salt. Eat the right amount of calories for you. Get information about a proper diet from your health care provider, if necessary.  . Regular physical exercise is one of the most important things you can do for your health. Most adults should get at least 150 minutes of moderate-intensity exercise (any activity that increases your heart rate and causes you to sweat) each week. In  addition, most adults need muscle-strengthening exercises on 2 or more days a week.  Silver Sneakers may be a benefit available to you. To determine eligibility, you may visit the website: www.silversneakers.com or contact program at (701)186-2465 Mon-Fri between 8AM-8PM.   . Maintain a healthy weight. The body mass index (BMI) is a screening tool to identify possible weight problems. It provides an estimate of body fat based on height and weight. Your health care provider can find your BMI and can help you achieve or maintain a healthy weight.   For adults 20 years and older: ? A BMI below 18.5 is considered underweight. ? A BMI of 18.5 to 24.9 is normal. ? A BMI of 25 to 29.9 is considered overweight. ? A BMI of 30 and above is considered obese.   . Maintain normal blood lipids and cholesterol levels by exercising and minimizing your intake of saturated fat. Eat a balanced diet with plenty of fruit and vegetables. Blood tests for lipids and cholesterol should begin at age 19 and be repeated every 5 years. If your lipid or cholesterol levels are high, you are over 50, or you are at high risk for heart disease, you may need your cholesterol levels checked more frequently. Ongoing high lipid and cholesterol levels should be treated with medicines if diet and exercise are not working.  . If you smoke, find out from your health care provider how to quit. If you do not use tobacco, please do not start.  Marland Kitchen  If you choose to drink alcohol, please do not consume more than 2 drinks per day. One drink is considered to be 12 ounces (355 mL) of beer, 5 ounces (148 mL) of wine, or 1.5 ounces (44 mL) of liquor.  . If you are 33-49 years old, ask your health care provider if you should take aspirin to prevent strokes.  . Use sunscreen. Apply sunscreen liberally and repeatedly throughout the day. You should seek shade when your shadow is shorter than you. Protect yourself by wearing long sleeves, pants, a  wide-brimmed hat, and sunglasses year round, whenever you are outdoors.  . Once a month, do a whole body skin exam, using a mirror to look at the skin on your back. Tell your health care provider of new moles, moles that have irregular borders, moles that are larger than a pencil eraser, or moles that have changed in shape or color.

## 2015-09-14 NOTE — Assessment & Plan Note (Signed)
Clearly improved and I appreciate help of all involved.

## 2015-09-23 DIAGNOSIS — J449 Chronic obstructive pulmonary disease, unspecified: Secondary | ICD-10-CM | POA: Diagnosis not present

## 2015-09-24 DIAGNOSIS — G4733 Obstructive sleep apnea (adult) (pediatric): Secondary | ICD-10-CM | POA: Diagnosis not present

## 2015-09-24 DIAGNOSIS — J449 Chronic obstructive pulmonary disease, unspecified: Secondary | ICD-10-CM | POA: Diagnosis not present

## 2015-10-10 DIAGNOSIS — J449 Chronic obstructive pulmonary disease, unspecified: Secondary | ICD-10-CM | POA: Diagnosis not present

## 2015-10-23 DIAGNOSIS — J449 Chronic obstructive pulmonary disease, unspecified: Secondary | ICD-10-CM | POA: Diagnosis not present

## 2015-10-24 DIAGNOSIS — J449 Chronic obstructive pulmonary disease, unspecified: Secondary | ICD-10-CM | POA: Diagnosis not present

## 2015-10-24 DIAGNOSIS — G4733 Obstructive sleep apnea (adult) (pediatric): Secondary | ICD-10-CM | POA: Diagnosis not present

## 2015-11-09 DIAGNOSIS — J449 Chronic obstructive pulmonary disease, unspecified: Secondary | ICD-10-CM | POA: Diagnosis not present

## 2015-11-23 DIAGNOSIS — J449 Chronic obstructive pulmonary disease, unspecified: Secondary | ICD-10-CM | POA: Diagnosis not present

## 2015-11-24 DIAGNOSIS — G4733 Obstructive sleep apnea (adult) (pediatric): Secondary | ICD-10-CM | POA: Diagnosis not present

## 2015-11-24 DIAGNOSIS — J449 Chronic obstructive pulmonary disease, unspecified: Secondary | ICD-10-CM | POA: Diagnosis not present

## 2015-11-29 DIAGNOSIS — Z7982 Long term (current) use of aspirin: Secondary | ICD-10-CM | POA: Diagnosis not present

## 2015-11-29 DIAGNOSIS — Z87891 Personal history of nicotine dependence: Secondary | ICD-10-CM | POA: Diagnosis not present

## 2015-11-29 DIAGNOSIS — Z952 Presence of prosthetic heart valve: Secondary | ICD-10-CM | POA: Insufficient documentation

## 2015-11-29 DIAGNOSIS — R040 Epistaxis: Secondary | ICD-10-CM | POA: Insufficient documentation

## 2015-11-29 DIAGNOSIS — J449 Chronic obstructive pulmonary disease, unspecified: Secondary | ICD-10-CM | POA: Insufficient documentation

## 2015-11-29 DIAGNOSIS — Z8679 Personal history of other diseases of the circulatory system: Secondary | ICD-10-CM | POA: Insufficient documentation

## 2015-11-29 DIAGNOSIS — M1711 Unilateral primary osteoarthritis, right knee: Secondary | ICD-10-CM | POA: Insufficient documentation

## 2015-11-29 DIAGNOSIS — Z79899 Other long term (current) drug therapy: Secondary | ICD-10-CM | POA: Diagnosis not present

## 2015-11-29 DIAGNOSIS — I119 Hypertensive heart disease without heart failure: Secondary | ICD-10-CM | POA: Insufficient documentation

## 2015-11-29 NOTE — ED Notes (Addendum)
Pt to triage via w/c with no distress noted; st bleeding to right nare noted when he placed O2 tubing in nose; pt reports wearing O2 PRN at night; scant bleeding noted at present; clamp applied; oral temp deferred at present due to nasal clamp in place

## 2015-11-30 ENCOUNTER — Emergency Department
Admission: EM | Admit: 2015-11-30 | Discharge: 2015-11-30 | Disposition: A | Discharge status: Home / Self Care | Payer: PPO | Attending: Emergency Medicine | Admitting: Emergency Medicine

## 2015-11-30 DIAGNOSIS — R040 Epistaxis: Secondary | ICD-10-CM

## 2015-11-30 MED ORDER — BACITRACIN ZINC 500 UNIT/GM EX OINT
TOPICAL_OINTMENT | CUTANEOUS | Status: AC
  Administered 2015-11-30: 1 via TOPICAL
  Filled 2015-11-30: qty 0.9

## 2015-11-30 MED ORDER — BACITRACIN ZINC 500 UNIT/GM EX OINT
TOPICAL_OINTMENT | Freq: Once | CUTANEOUS | Status: AC
  Administered 2015-11-30: 1 via TOPICAL

## 2015-11-30 NOTE — Discharge Instructions (Signed)
Use vaseline or an antibacterial ointment such as neosporin to the nasal septum (middle/inside of the nose) 3 times per day.  Be very careful reinserting your nasal canula so that you do not cause any trauma to the nose.   Nosebleed Nosebleeds are common. A nosebleed can be caused by many things, including:  Getting hit hard in the nose.  Infections.  Dryness in your nose.  A dry climate.  Medicines.  Picking your nose.  Your home heating and cooling systems. HOME CARE   Try controlling your nosebleed by pinching your nostrils gently. Do this for at least 10 minutes.  Avoid blowing or sniffing your nose for a number of hours after having a nosebleed.  Do not put gauze inside of your nose yourself. If your nose was packed by your doctor, try to keep the pack inside of your nose until your doctor removes it.  If a gauze pack was used and it starts to fall out, gently replace it or cut off the end of it.  If a balloon catheter was used to pack your nose, do not cut or remove it unless told by your doctor.  Avoid lying down while you are having a nosebleed. Sit up and lean forward.  Use a nasal spray decongestant to help with a nosebleed as told by your doctor.  Do not use petroleum jelly or mineral oil in your nose. These can drip into your lungs.  Keep your house humid by using:  Less air conditioning.  A humidifier.  Aspirin and blood thinners make bleeding more likely. If you are prescribed these medicines and you have nosebleeds, ask your doctor if you should stop taking the medicines or adjust the dose. Do not stop medicines unless told by your doctor.  Resume your normal activities as you are able. Avoid straining, lifting, or bending at your waist for several days.  If your nosebleed was caused by dryness in your nose, use over-the-counter saline nasal spray or gel. If you must use a lubricant:  Choose one that is water-soluble.  Use it only as needed.  Do not  use it within several hours of lying down.  Keep all follow-up visits as told by your doctor. This is important. GET HELP IF:  You have a fever.  You get frequent nosebleeds.  You are getting nosebleeds more often. GET HELP RIGHT AWAY IF:  Your nosebleed lasts longer than 20 minutes.  Your nosebleed occurs after an injury to your face, and your nose looks crooked or broken.  You have unusual bleeding from other parts of your body.  You have unusual bruising on other parts of your body.  You feel light-headed or dizzy.  You become sweaty.  You throw up (vomit) blood.  You have a nosebleed after a head injury.   This information is not intended to replace advice given to you by your health care provider. Make sure you discuss any questions you have with your health care provider.   Document Released: 03/11/2008 Document Revised: 06/23/2014 Document Reviewed: 01/16/2014 Elsevier Interactive Patient Education [employer].

## 2015-11-30 NOTE — ED Notes (Signed)
Pt reports nasal clamp has been on nose since approx 22:30 6/15

## 2015-11-30 NOTE — ED Notes (Signed)
MD Schaevitz at bedside. 

## 2015-11-30 NOTE — ED Provider Notes (Signed)
[facility] Emergency Department Provider Note   ____________________________________________  Time seen: Approximately 150 AM  I have reviewed the triage vital signs and the nursing notes.   HISTORY  Chief Complaint Epistaxis   HPI [name] [name] is a 82 y.o. male with a history of valve replacement on Plavix was presenting today with a nosebleed. He says it started about 6 PM it is putting and his oxygen he felt a scratch to his right ear. He said that he began bleeding at that time and "would not stop." He said he put a tissue up his nose but it still was trickling around the tissue and also down the back of his throat. He was given a clamp to his nose in triage and is wearing it for about 3 hours at this time. Denies any pain. Says that a nosebleed as well several months ago and it stopped on its own and he did not require any cautery.He said that he had some clots after putting the tissue in his right near.   Past Medical History  Diagnosis Date  . Prediabetes     Hgb A1C 5.8 in Jan 2017  . COPD (chronic obstructive pulmonary disease) (HCC)     on home O2 4LPM  . Anxiety   . Hemorrhoids   . Diverticulosis   . PUD (peptic ulcer disease)   . Former smoker     quit 2001  . Right knee DJD 10/31/2011    Severe  . Severe aortic stenosis     a. Echo 11/16:  Mod LVH, EF 55-60%, no RWMA, Gr 1 DD, severe AS (mean 51 mmHg; peak 86 mmHg), MAC, mild LAE  b. s/p TAVR on 06/26/2015 with 20mm Edward Sapien 3 THV  . OSA on CPAP     last study- in the home possibility- 2015, pt. unsure   . Shortness of breath dyspnea   . Kidney stones     passed spontaneously  . AAA (abdominal aortic aneurysm) (HCC)     3.8 cm CTA 05/2015  . S/P TAVR (transcatheter aortic valve replacement) 06/26/2015    26 mm Edwards Sapien 3 transcatheter heart valve placed via percutaneous right transfemoral approach    Patient Active Problem List   Diagnosis Date Noted  . Fecal urgency  09/14/2015  . Dark urine 09/14/2015  . S/P TAVR (transcatheter aortic valve replacement) 06/26/2015  . Severe aortic stenosis 05/15/2015  . Medicare annual wellness visit, subsequent 05/21/2014  . Advance care planning 05/21/2014  . Skin irritation 12/20/2013  . Risk for falls 12/20/2013  . Rib pain on right side 01/29/2013  . Vertigo 07/01/2012  . Right knee DJD 10/31/2011  . Knee pain 10/23/2011  . OSA (obstructive sleep apnea) 07/31/2009  . Chronic respiratory failure, unspecified whether with hypoxia or hypercapnia ([place]) 08/03/2008  . Morbid obesity ([place]) 02/22/2008  . POLYP, COLON 08/27/2007  . ANXIETY 08/27/2007  . COPD (chronic obstructive pulmonary disease) with emphysema ([place]) 08/27/2007  . INTERNAL HEMORRHOIDS 03/23/2007  . DIVERTICULOSIS, COLON 03/23/2007  . SYMPTOM, PAIN, ABDOMINAL, GENERALIZED 02/18/2007  . HYPERGLYCEMIA 02/18/2007  . HYPERTENSION, BENIGN ESSENTIAL 02/10/2007  . NEOPLASM, SKIN, UNCERTAIN BEHAVIOR 123456  . PEPTIC ULCER DISEASE WITH H-PYLORI  TX'D 11/19/2006  . NEPHROLITHIASIS 11/19/2006  . BACK PAIN, CHRONIC 11/19/2006  . BENIGN PROSTATIC HYPERTROPHY, HX OF 11/19/2006    Past Surgical History  Procedure Laterality Date  . Knee surgery Right 1985    arthrosopic -   . Cholecystectomy    .  Tonsillectomy    . Cardiac catheterization N/A 05/15/2015    Procedure: Right/Left Heart Cath and Coronary Angiography;  Surgeon: [name], MD;  Location: [facility];  Service: Cardiovascular;  Laterality: N/A;  . Eye surgery      bilateral cataracts removed, /w IOL  . Transcatheter aortic valve replacement, transfemoral N/A 06/26/2015    Procedure: TRANSCATHETER AORTIC VALVE REPLACEMENT, TRANSFEMORAL;  Surgeon: [name], MD;  Location: [facility];  Service: Open Heart Surgery;  Laterality: N/A;  . Tee without cardioversion N/A 06/26/2015    Procedure: TRANSESOPHAGEAL ECHOCARDIOGRAM (TEE);  Surgeon: [name], MD;  Location: [place];   Service: Open Heart Surgery;  Laterality: N/A;    Current Outpatient Rx  Name  Route  Sig  Dispense  Refill  . aspirin EC 81 MG tablet   Oral   Take 1 tablet (81 mg total) by mouth daily.         . budesonide-formoterol (SYMBICORT) 160-4.5 MCG/ACT inhaler   Inhalation   Inhale 2 puffs into the lungs 2 (two) times daily.   10.2 Inhaler   5   . clopidogrel (PLAVIX) 75 MG tablet   Oral   Take 1 tablet (75 mg total) by mouth daily with breakfast.   90 tablet   3   . furosemide (LASIX) 20 MG tablet   Oral   Take 20 mg by mouth daily.         Marland Kitchen guaiFENesin (MUCINEX) 600 MG 12 hr tablet   Oral   Take 600 mg by mouth 2 (two) times daily.          Marland Kitchen ibuprofen (ADVIL,MOTRIN) 200 MG tablet   Oral   Take 200 mg by mouth every 6 (six) hours as needed (pain). Reported on 07/30/2015         . LORazepam (ATIVAN) 1 MG tablet      Take 1/4 to 1/2 tablet by mouth every 6 hours as needed for anxiety or sleep.         . metoprolol tartrate (LOPRESSOR) 25 MG tablet   Oral   Take 0.5 tablets (12.5 mg total) by mouth 2 (two) times daily.   90 tablet   3   . OXYGEN   Inhalation   Inhale into the lungs. 4 Liters         . polyethylene glycol powder (GLYCOLAX/MIRALAX) powder   Oral   Take 17 g by mouth daily as needed.   500 g   1   . PROAIR HFA 108 (90 BASE) MCG/ACT inhaler      INHALE 1-2 PUFFS INTO THE LUNGS EVERY 6 (SIX) HOURS AS NEEDED FOR WHEEZING.   8.5 each   3   . RABEprazole (ACIPHEX) 20 MG tablet   Oral   Take 1 tablet (20 mg total) by mouth daily.   30 tablet   8   . Respiratory Therapy Supplies (FLUTTER) DEVI      Use as directed   1 each   0   . sodium chloride (OCEAN) 0.65 % SOLN nasal spray   Each Nare   Place 2 sprays into both nostrils daily as needed for congestion.          Marland Kitchen tiotropium (SPIRIVA HANDIHALER) 18 MCG inhalation capsule   Inhalation   Place 1 capsule (18 mcg total) into inhaler and inhale daily.   30 capsule   6      Allergies Iohexol; Atorvastatin; Celecoxib; Clinoril; Nitrofurantoin; and Sulfa antibiotics  Family  History  Problem Relation Age of Onset  . Stroke Mother   . Heart attack Mother 20    deceased  . Prostate cancer Father 15  . Colon cancer Neg Hx     Social History Social History  Substance Use Topics  . Smoking status: Former Smoker -- 1.50 packs/day for 53 years    Types: Cigarettes    Quit date: 06/23/1999  . Smokeless tobacco: Former Systems developer    Types: Snuff, Chew  . Alcohol Use: No    Review of Systems Constitutional: No fever/chills Eyes: No visual changes. ENT: No sore throat. Cardiovascular: Denies chest pain. Respiratory: Denies shortness of breath. Gastrointestinal: No abdominal pain.  No nausea, no vomiting.  No diarrhea.  No constipation. Genitourinary: Negative for dysuria. Musculoskeletal: Negative for back pain. Skin: Negative for rash. Neurological: Negative for headaches, focal weakness or numbness.  10-point ROS otherwise negative.  ____________________________________________   PHYSICAL EXAM:  VITAL SIGNS: ED Triage Vitals  Enc Vitals Group     BP 11/29/15 2324 150/75 mmHg     Pulse Rate 11/29/15 2324 77     Resp 11/29/15 2324 20     Temp --      Temp src --      SpO2 11/29/15 2324 92 %     Weight 11/29/15 2324 272 lb (123.378 kg)     Height 11/29/15 2324 5\' 10"  (1.778 m)     Head Cir --      Peak Flow --      Pain Score --      Pain Loc --      Pain Edu? --      Excl. in [place]? --     Constitutional: Alert and oriented. Well appearing and in no acute distress. Eyes: Conjunctivae are normal. PERRL. EOMI. Head: Atraumatic. Nose: No congestion/rhinnorhea.  That removed and Small amount of clotted blood bilateral nares. No obvious large bleeding vessel to the septum. No active bleeding at this time. Mouth/Throat: Mucous membranes are moist.  Oropharynx non-erythematous. Very small amount of blood in the posterior pharynx without any  active bleeding. Neck: No stridor.   Cardiovascular: Normal rate, regular rhythm. Grossly normal heart sounds.   Respiratory: Normal respiratory effort.  No retractions. Lungs CTAB. Gastrointestinal: Soft and nontender. No distention.  Musculoskeletal: No lower extremity tenderness nor edema.  No joint effusions. Neurologic:  Normal speech and language. No gross focal neurologic deficits are appreciated. No gait instability. Skin:  Skin is warm, dry and intact. No rash noted. Psychiatric: Mood and affect are normal. Speech and behavior are normal.  ____________________________________________   LABS (all labs ordered are listed, but only abnormal results are displayed)  Labs Reviewed - No data to display ____________________________________________  EKG   ____________________________________________  RADIOLOGY   ____________________________________________   PROCEDURES  ____________________________________________   INITIAL IMPRESSION / ASSESSMENT AND PLAN / ED COURSE  Pertinent labs & imaging results that were available during my care of the patient were reviewed by me and considered in my medical decision making (see chart for details).  ----------------------------------------- 2:08 AM on 11/30/2015 -----------------------------------------  Patient continues without the nasal clamp and has no return of his bleeding. We will give bacitracin ointment to the septum bilaterally. He knows to be very careful reinserting his cannula and said that he would actually like to go without his cannula in the rest of the night. He will continue with Vaseline or antibiotic ointment to the nares bilaterally 3 times a day. He knows that  he may use the clamp at home as well dropped to 30 minutes at a time. He knows to return immediately to the emergency department for any  uncontrolled bleeding. He is understanding of the plan and willing to comply.  Temp take in the room it is 97.7  orally. _________________________________________ ___   FINAL CLINICAL IMPRESSION(S) / ED DIAGNOSES  Anterior epistaxis.    NEW MEDICATIONS STARTED DURING THIS VISIT:  New Prescriptions   No medications on file     Note:  This document was prepared using Dragon voice recognition software and may include unintentional dictation errors.    Orbie Pyo, MD 11/30/15 [phone]

## 2015-11-30 NOTE — ED Notes (Signed)
Reviewed d/c instructions, follow-up care with pt. Pt verbalized understanding 

## 2015-11-30 NOTE — ED Notes (Addendum)
Pt c/o of intermittant epitaxis beginning at 18:30 6/15. Pt reports he placed his O2 [place] on and he felt like it scratched his nasal cavity.

## 2015-12-10 DIAGNOSIS — J449 Chronic obstructive pulmonary disease, unspecified: Secondary | ICD-10-CM | POA: Diagnosis not present

## 2015-12-21 ENCOUNTER — Telehealth: Payer: Self-pay | Admitting: Pulmonary Disease

## 2015-12-21 NOTE — Telephone Encounter (Signed)
Spoke with pt and he states that he has been taking Spiriva and Symbicort. He is in the doughnut hole and cannot afford his medications. He would like to know if he could pick up some samples. We do not have Handihaler samples, but we do have Respimat. We also have Symbicort.  SN, Please advise if pt can be changed to Respimat so that he can get samples since he cannot afford his medications. Thanks!   Allergies  Allergen Reactions  . Iohexol Hives and Swelling    Pt reports swelling, redness, hives, and blisters   . Atorvastatin     REACTION: aches  . Celecoxib     REACTION: rash  . Clinoril [Sulindac]     REACTION: rash  . Nitrofurantoin   . Sulfa Antibiotics     Intolerant but unrecalled.     Current Outpatient Prescriptions on File Prior to Visit  Medication Sig Dispense Refill  . aspirin EC 81 MG tablet Take 1 tablet (81 mg total) by mouth daily.    . budesonide-formoterol (SYMBICORT) 160-4.5 MCG/ACT inhaler Inhale 2 puffs into the lungs 2 (two) times daily. 10.2 Inhaler 5  . clopidogrel (PLAVIX) 75 MG tablet Take 1 tablet (75 mg total) by mouth daily with breakfast. 90 tablet 3  . furosemide (LASIX) 20 MG tablet Take 20 mg by mouth daily.    Marland Kitchen guaiFENesin (MUCINEX) 600 MG 12 hr tablet Take 600 mg by mouth 2 (two) times daily.     Marland Kitchen ibuprofen (ADVIL,MOTRIN) 200 MG tablet Take 200 mg by mouth every 6 (six) hours as needed (pain). Reported on 07/30/2015    . LORazepam (ATIVAN) 1 MG tablet Take 1/4 to 1/2 tablet by mouth every 6 hours as needed for anxiety or sleep.    . metoprolol tartrate (LOPRESSOR) 25 MG tablet Take 0.5 tablets (12.5 mg total) by mouth 2 (two) times daily. 90 tablet 3  . OXYGEN Inhale into the lungs. 4 Liters    . polyethylene glycol powder (GLYCOLAX/MIRALAX) powder Take 17 g by mouth daily as needed. 500 g 1  . PROAIR HFA 108 (90 BASE) MCG/ACT inhaler INHALE 1-2 PUFFS INTO THE LUNGS EVERY 6 (SIX) HOURS AS NEEDED FOR WHEEZING. 8.5 each 3  . RABEprazole (ACIPHEX)  20 MG tablet Take 1 tablet (20 mg total) by mouth daily. 30 tablet 8  . Respiratory Therapy Supplies (FLUTTER) DEVI Use as directed 1 each 0  . sodium chloride (OCEAN) 0.65 % SOLN nasal spray Place 2 sprays into both nostrils daily as needed for congestion.     Marland Kitchen tiotropium (SPIRIVA HANDIHALER) 18 MCG inhalation capsule Place 1 capsule (18 mcg total) into inhaler and inhale daily. 30 capsule 6   No current facility-administered medications on file prior to visit.

## 2015-12-23 DIAGNOSIS — J449 Chronic obstructive pulmonary disease, unspecified: Secondary | ICD-10-CM | POA: Diagnosis not present

## 2015-12-24 DIAGNOSIS — J449 Chronic obstructive pulmonary disease, unspecified: Secondary | ICD-10-CM | POA: Diagnosis not present

## 2015-12-24 DIAGNOSIS — G4733 Obstructive sleep apnea (adult) (pediatric): Secondary | ICD-10-CM | POA: Diagnosis not present

## 2015-12-25 MED ORDER — BUDESONIDE-FORMOTEROL FUMARATE 160-4.5 MCG/ACT IN AERO: 2.0000 | INHALATION_SPRAY | Freq: Two times a day (BID) | RESPIRATORY_TRACT | Status: DC

## 2015-12-25 NOTE — Telephone Encounter (Signed)
Office visit scheduled to discuss medication changes per SN.  Nothing further needed.

## 2015-12-25 NOTE — Telephone Encounter (Signed)
Pt wife came by [place] office for samples. One sample of symbicort was given and pt wife was informed of SN recommendations.

## 2015-12-25 NOTE — Telephone Encounter (Signed)
Per SN: We cannot provide samples for the remainder of the year.  We can provide samples enough to get him started on another regimen that would be better covered by his insurance.  Recommend duoneb and pulmicort nebulizers TID- to be ordered through homecare company so this can be billed through Medicare part B.   Pt also is due for an appt, needs to come in so this can be discussed in detail and set up.    lmtcb X1 to schedule rov with pt.

## 2015-12-25 NOTE — Telephone Encounter (Signed)
SN please advise. Thanks.  

## 2015-12-27 ENCOUNTER — Ambulatory Visit (INDEPENDENT_AMBULATORY_CARE_PROVIDER_SITE_OTHER): Payer: PPO | Admitting: Pulmonary Disease

## 2015-12-27 ENCOUNTER — Encounter: Payer: Self-pay | Admitting: Pulmonary Disease

## 2015-12-27 VITALS — BP 136/72 | HR 82 | Temp 98.2°F | Ht 70.0 in | Wt 282.0 lb

## 2015-12-27 DIAGNOSIS — G4733 Obstructive sleep apnea (adult) (pediatric): Secondary | ICD-10-CM | POA: Diagnosis not present

## 2015-12-27 DIAGNOSIS — I350 Nonrheumatic aortic (valve) stenosis: Secondary | ICD-10-CM | POA: Diagnosis not present

## 2015-12-27 DIAGNOSIS — J9610 Chronic respiratory failure, unspecified whether with hypoxia or hypercapnia: Secondary | ICD-10-CM

## 2015-12-27 DIAGNOSIS — J449 Chronic obstructive pulmonary disease, unspecified: Secondary | ICD-10-CM | POA: Insufficient documentation

## 2015-12-27 DIAGNOSIS — Z954 Presence of other heart-valve replacement: Secondary | ICD-10-CM

## 2015-12-27 DIAGNOSIS — J441 Chronic obstructive pulmonary disease with (acute) exacerbation: Secondary | ICD-10-CM | POA: Insufficient documentation

## 2015-12-27 DIAGNOSIS — Z952 Presence of prosthetic heart valve: Secondary | ICD-10-CM

## 2015-12-27 MED ORDER — ALBUTEROL SULFATE (2.5 MG/3ML) 0.083% IN NEBU
2.5000 mg | INHALATION_SOLUTION | Freq: Once | RESPIRATORY_TRACT | Status: AC
  Administered 2015-12-27: 2.5 mg via RESPIRATORY_TRACT

## 2015-12-27 MED ORDER — IPRATROPIUM-ALBUTEROL 0.5-2.5 (3) MG/3ML IN SOLN: 3.0000 mL | Freq: Three times a day (TID) | RESPIRATORY_TRACT | Status: DC

## 2015-12-27 MED ORDER — BUDESONIDE 0.25 MG/2ML IN SUSP: 0.2500 mg | Freq: Three times a day (TID) | RESPIRATORY_TRACT | Status: DC

## 2015-12-27 MED ORDER — BUDESONIDE-FORMOTEROL FUMARATE 160-4.5 MCG/ACT IN AERO: 2.0000 | INHALATION_SPRAY | Freq: Two times a day (BID) | RESPIRATORY_TRACT | Status: DC

## 2015-12-27 MED ORDER — TIOTROPIUM BROMIDE MONOHYDRATE 1.25 MCG/ACT IN AERS: 2.0000 | INHALATION_SPRAY | Freq: Every day | RESPIRATORY_TRACT | Status: DC

## 2015-12-27 NOTE — Progress Notes (Signed)
Subjective:     Patient ID: [name] [name], male   DOB: 1934-06-05, 82 y.o.   MRN: [id number]  HPI     82 y/o WM followed for OSA, COPD, chronic resp failure on home O2...     PFT 01/1999 showed FVC=2.11 (46%), FEV1=1.33 (37%), %1sec=63, mid-flows reduced at 17% pred; post bronchodil FEV1=1.52 (14% imroved); TLC=6.12 (86%), RV=4.01 (157%), RV/TLC=66; DLCO=70% pred;;this is c/w moderate obstructive ventilatory defect w/ small revers component, air trapping, & mild decr diffusion...   Sleep Study 11/1999 showed RDI=33/hr mostly hypopneas, subseq CPAP titration=> good control on CPAP 12 but desat to <90% despite the CPAP...  CPAP titration 2005 showed fair control w/ CPAP 15 7 rec to use CPAP 16, Fisher-Paykel full face mask, & noted signif leg jerks w/ arousals...  CT Angio Chest 01/28/2004 showed neg for PE, 2.5 cm well circumscribed mass in ant mediastinum just deep to the manubrium (r/o thymoma), atherosclerotic vasc calcif  He was seen by [place] who rec surgery for excision of the ant mediastinal mass ; pt declined surg & went to [facility] for 2nd opinion...  12/05 - 3/06> Pt evaluated at [facility] DrFeins CV surg- they also rec surg to remove the cystic mass but pt declined & preferred conservative follow up...  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  CXR 07/24/08 showed borderline heart size, patchy RUL opac c/w pneumonia, DJD Tspine...  Desensitization mask fit> this was ordered by DrClance 09/2012 & 10/2014...  Baseline CXR 01/17/13 showed borderline cardiomeg, sl hyperinflation, clear lungs/ NAD, DJD in Tspine...  EKG 10/03/13 showed NSR, rate89, poor R prog V1-3, NAD...    ~  April 24, 2014:  ROV w/ KC>        The  patient comes in today for follow-up of his obstructive sleep apnea, COPD, and chronic respiratory failure. He has done well since the last visit, and has even lost 9 pounds. He is staying on his bronchodilator regimen, and has not had a recent acute exacerbation. He feels his exertional tolerance is at baseline. He is also wearing C Pap compliantly, but continues to have issues with mask leaking and facial irritation.      REC>  OSA: The patient is wearing his C Pap device compliantly, but continues to have issues with his mask. I have shown him a new fullface mask, and also given him a sample of a new nasal pillows device to try. I have also encouraged him to keep working on weight loss...  COPD: The patient appears to be stable from a COPD standpoint on his current bronchodilator regimen. He is not requiring frequent use of his rescue inhaler, and has not had a recent acute exacerbation.  ~  Oct 23, 2014:  ROV w/ KC>        The patient comes in today for follow-up of his mild to moderate COPD, as well as obstructive sleep apnea with obesity hypoventilation. He is wearing his C Pap compliantly, but is having issues with his mask fit this time. This has been an ongoing issue for him. He has had no significant chest congestion or purulence, and feels that his exertional tolerance is at baseline. His weight has increased significantly since the last  visit. He is not able to do a lot of activity, and it is primarily related to his weight and conditioning, in addition to his breathing.      REC>  OSA: The patient is wearing his CPAP compliantly, but continues to have issues with mask fit. I think he would benefit from a session at the sleep Center to look at different types of mask. He is also continued to gain weight, and I've encouraged him to work aggressively on weight loss. Other than his mask fit, he feels that he sleeps well with his device...  COPD: The patient appears to be stable from a COPD standpoint,  and I have stressed to him the importance of weight loss and conditioning to improve his quality of life. He is to continue on his current bronchodilator regimen.  ~  April 11, 2015:  Add-on appt w/ SN>        Pt is c/o increased congestion "in my throat & chest, cough w/ small amt beige sput, no hemoptysis, and incr SOB "I just can't get up the phlegm" w/ DOE during ADLs and any activity; he notes that his O2sat will drop into the 70's (states he prev used the oxygen just as needed but now he needs it all the time);  He is an ex-smoker, smoking for over 39yr up to 1.5ppd, quit in 2001;  He states that he can't do any exercise but blames his "leg problem" which he in turn blames on "too much Celebrex", says it's stiff, exam w/ DJD & venous insuffic changes but I don't see any recent PCP eval;  He has hx of poor compliance w/ med rx-- see prob list below...       EXAM shows Afeb, VSS, O2sat=94% (on 4L);  HEENT- neg, mallampati2;  Chest- decr BS bilat w/ scat rhonchi, mild end-exp wheezing, no rales or consolidation;  Heart- R%R gr3/6AS murmur w/o rubs/ gallops;  Abd- soft, neg;  Ext- VI, 1+edema, w/o c/c;  Neuro- intact w/o focal abn...     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    COPD w/ chr hypoxemic resp failure>  On Home O2, Symbicort160-2spBid, not on Spiriva, Mucinex 6039m taking one daily, Flutter valve rx, ProairHFA prn (averages 2x per week he says;  He has severe airflow obstruction, GOLD Stage4 COPD and needs max medication & compliance w/ med rx => rec to use O2 at 2L/min continuous, Symbicort160-2spBid, add SPIRIVA daily, increase the MUCINEX 60056mo 2Bid + Flutter Rx, and prn ProairHFA...     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    SEVERE AORTIC STENOSIS> see below-- prev objective data showed calcif AV on CT scans, exam reveals loud AS murmur, 2DEcho c/w severe AS => refer to [facility]...    Anxiety>  On Ativan 1mg42msing 1/4 to 1/2 tab Q6H prn by his description...   Spirometry 04/11/15 showed FVC=1,52 (35%), FEV1=0.77 (24%), %1sec=50, mid-flows reduced at 11% predicted;  This is c/w severe airflow obstruction and GOLD Stage 4 COPD  Labs 04/11/15>  Chems- ok x HCO3=38;  CBC- wnl;  TSH=2.62...  CT  Chest 04/18/15 showed norm heart size, scattered atherosclerotic calcif but dense calcif in AoV region & mitral annulus; anterior mediastinal nodule sl larger than 2007 (now 75m, prev 175m felt to represent a benign thymic cyst; patchy tree-in-bud appearance of RML- likely inflammatory or poss atyp infection like MAC; scarring left base; mod degen changes in Tspine...   2DEcho 04/18/15 showed mod LVH w/ norm LVF & EF=55-60% & no regional wall motion abn; grade 1 DD; severe Ao stenosis w/ mean grad=51 and peak grad=86; severely calcif mitral annulus, mild LA dil... IMP/PLAN>>  JoKamanias severe multisys dis w/ severe COPD & not on max therapy- in this regard we discussed the NEED for medication compliance- use CPAP nightly, O2 continuously, Symbicort160-2spBid, add SPIRIVA daily, increase MUCINEX600-2Bid, and ProairHFA rescue inhaler as needed... EXAM reveals AS & 2DEcho confirms SEVERE AS & he does not have a cardiologist => refer to cards ASAP... We plan recheck 43m10mo~  December 27, 2015:  9mo44mo & pulm recheck>  He is here primarily c/o being in the donut hole & can't afford Symbicort/ Spiriva => we gave him samples and set up this ROV to discuss NEB Rx...      When we first met Griselda- we detected an AS murmur & 2DEcho confirmed severe AS;  He was referred to [facility] => TAVR done 06/26/15 by DrCooper & DrBartle (he says bill was  $[phone]);  He has done well since then;  Followed for COPD w/ small reversible component, chronic hypoxemic RF, OSA on CPAP-- on Home O2, Symbicort160-2spBid, Mucinex/ Proair/ Flutter prn;  Now in the donut hole & can't afford his inhalers...    He saw DrSoSarcoxie2 for f/u OSA> using CPAP compliantly, DME took his SD card & didn't return it so download not poss, they decided on new machine, offered pulm rehab...    He had f/u [facility]- DrCooper 2/13> s/p TAVR done 06/26/15 & improved w/ NYHA-2 symptoms, f/u 2DEcho improved.. 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    [facility] and doing satis...    He developed epistaxis-- seen in ER 11/30/15 w/ hx TAVR on Plavix & he scratched his nasal mucosa w/ nasal cannula, treated w/ nasal clamp, given bacitracin ointment, & take care w/ cannula-- no recurrence...  EXAM shows Afeb, VSS, O2sat=95% on 4L by [place];  Wt=282# & BMI=40;  HEENT- neg, no bleeding, mallampati2;  Chest- decr BS bilat few scat rhonchi, no wheezing/ rales;  Heart- RR gr1/6 SEM w/o r/g;  Abd- obese, soft, neg; Ext- VI, tr edema.  We obtained a CPAP download 6/13 - 12/26/15> used 30/30 days, ave 9H per night, on CPAP=11 but has large air leaks and ave AHI=14... We will request ApriHuey Romanswork w/ him for better mask fit & he has f/u DrSood 02/04/16... IMP/PLAN>>  We reviewed diet & exercise prescriptions to aid in wt reduction;  We decided to change his MDIs to NEB using DUONEB Tid followed by PULMICORT 0.25 Tid;  We will also request [employer] to check pt's mask in light of signif air leak on his download data => he has f/u DrSood, Sleep Med planned for 02/04/16...    Past Medical History  Diagnosis Date  . Prediabetes     Hgb A1C 5.8 in Jan 2017  . COPD (chronic obstructive  pulmonary disease) ([place])      on home O2 4LPM  . Anxiety   . Hemorrhoids   . Diverticulosis   . PUD (peptic ulcer disease)   . Former smoker     quit 2001  . Right knee DJD 10/31/2011    Severe  . Severe aortic stenosis     a. Echo 11/16:  Mod LVH, EF 55-60%, no RWMA, Gr 1 DD, severe AS (mean 51 mmHg; peak 86 mmHg), MAC, mild LAE  b. s/p TAVR on 06/26/2015 with 70m Edward Sapien 3 THV  . OSA on CPAP     last study- in the home possibility- 2015, pt. unsure   . Shortness of breath dyspnea   . Kidney stones     passed spontaneously  . AAA (abdominal aortic aneurysm) ([facility])     3.8 cm CTA 05/2015  . S/P TAVR (transcatheter aortic valve replacement) 06/26/2015    26 mm Edwards Sapien 3 transcatheter heart valve placed via percutaneous right transfemoral approach    Past Surgical History  Procedure Laterality Date  . Knee surgery Right 1985    arthrosopic -   . Cholecystectomy    . Tonsillectomy    . Cardiac catheterization N/A 05/15/2015    Procedure: Right/Left Heart Cath and Coronary Angiography;  Surgeon: MSherren Mocha MD;  [facility];  Service: Cardiovascular;  Laterality: N/A;  . Eye surgery      bilateral cataracts removed, /w IOL  . Transcatheter aortic valve replacement, transfemoral N/A 06/26/2015    Procedure: TRANSCATHETER AORTIC VALVE REPLACEMENT, TRANSFEMORAL;  Surgeon: MSherren Mocha MD;  [place] [facility]  Service: Open Heart Surgery;  Laterality: N/A;  . Tee without cardioversion N/A 06/26/2015    Procedure: TRANSESOPHAGEAL ECHOCARDIOGRAM (TEE);  Surgeon: MSherren Mocha MD;  [facility] [facility]  Service: Open Heart Surgery;  Laterality: N/A;    Outpatient Encounter Prescriptions as of 12/27/2015  Medication Sig  . aspirin EC 81 MG tablet Take 1 tablet (81 mg total) by mouth daily.  . budesonide-formoterol (SYMBICORT) 160-4.5 MCG/ACT inhaler Inhale 2 puffs into the lungs 2 (two) times daily.  . clopidogrel (PLAVIX) 75 MG tablet Take 1 tablet (75 mg total) by mouth daily with  breakfast.  . furosemide (LASIX) 20 MG tablet Take 20 mg by mouth daily.  .Marland KitchenguaiFENesin (MUCINEX) 600 MG 12 hr tablet Take 600 mg by mouth 2 (two) times daily.   .Marland Kitchenibuprofen (ADVIL,MOTRIN) 200 MG tablet Take 200 mg by mouth every 6 (six) hours as needed (pain). Reported on 07/30/2015  . LORazepam (ATIVAN) 1 MG tablet Take 1/4 to 1/2 tablet by mouth every 6 hours as needed for anxiety or sleep.  . metoprolol tartrate (LOPRESSOR) 25 MG tablet Take 0.5 tablets (12.5 mg total) by mouth 2 (two) times daily.  . OXYGEN Inhale into the lungs. 4 Liters  . polyethylene glycol powder (GLYCOLAX/MIRALAX) powder Take 17 g by mouth daily as needed.  .Marland KitchenPROAIR HFA 108 (90 BASE) MCG/ACT inhaler INHALE 1-2 PUFFS INTO THE LUNGS EVERY 6 (SIX) HOURS AS NEEDED FOR WHEEZING.  . RABEprazole (ACIPHEX) 20 MG tablet Take 1 tablet (20 mg total) by mouth daily.  .Marland KitchenRespiratory Therapy Supplies (FLUTTER) DEVI Use as directed  . sodium chloride (OCEAN) 0.65 % SOLN nasal spray Place 2 sprays into both nostrils daily as needed for congestion.   . Tiotropium Bromide Monohydrate (SPIRIVA RESPIMAT) 1.25 MCG/ACT AERS Inhale 2 puffs into the lungs daily.   No facility-administered encounter medications on file as  of 12/27/2015.    Allergies  Allergen Reactions  . Iohexol Hives and Swelling    Pt reports swelling, redness, hives, and blisters   . Atorvastatin     REACTION: aches  . Celecoxib     REACTION: rash  . Clinoril [Sulindac]     REACTION: rash  . Nitrofurantoin   . Sulfa Antibiotics     Intolerant but unrecalled.     Immunization History  Administered Date(s) Administered  . Influenza Split 06/17/2011, 02/23/2012  . Influenza Whole 04/27/2006, 04/10/2009, 04/10/2010  . Influenza,inj,Quad PF,36+ Mos 03/17/2013, 04/24/2014  . Pneumococcal Polysaccharide-23 02/15/1999, 10/20/2011  . Td 02/05/2006  . Zoster 01/15/2009    Current Medications, Allergies, Past Medical History, Past Surgical History, Family History,  and Social History were reviewed in [employer] record.   Review of Systems             All symptoms NEG except where BOLDED >>  Constitutional:  F/C/S, fatigue, anorexia, unexpected weight change. HEENT:  HA, visual changes, hearing loss, earache, nasal symptoms, sore throat, mouth sores, hoarseness. Resp:  cough, sputum, hemoptysis; SOB, tightness, wheezing. Cardio:  CP, palpit, DOE, orthopnea, edema. GI:  N/V/D/C, blood in stool; reflux, abd pain, distention, gas. GU:  dysuria, freq, urgency, hematuria, flank pain, voiding difficulty. MS:  joint pain, swelling, tenderness, decr ROM; neck pain, back pain, etc. Neuro:  HA, tremors, seizures, dizziness, syncope, weakness, numbness, gait abn. Skin:  suspicious lesions or skin rash. Heme:  adenopathy, bruising, bleeding. Psyche:  confusion, agitation, sleep disturbance, hallucinations, anxiety, depression suicidal.   Objective:   Physical Exam       Vital Signs:  Reviewed...  General:  WD, overweight, 82 y/o WM in NAD; Wt= 278#; alert & oriented; pleasant & cooperative... HEENT:  [place]/AT; Conjunctiva- pink, Sclera- nonicteric, EOM-wnl, PERRLA, EACs-clear, TMs-wnl; NOSE-clear; THROAT-clear & wnl. Neck:  Supple w/ fair ROM; no JVD; normal carotid impulses w/ transmit murmur; no thyromegaly or nodules palpated; no lymphadenopathy. Chest:  Decr BS bilat w/ scat rhonchi & end-exp wheezing; no rales or consolidation... Heart:  Regular Rhythm;  Gr 1/6 Sys murmur of AS; w/o rubs or gallops => improved s/p TAVR 06/2015... Abdomen:  Soft & nontender- no guarding or rebound; normal bowel sounds; no organomegaly or masses palpated. Ext:  Decr ROM; without deformities +arthritic changes; +varicose veins/ venous insuffic, tr edema;  Pulses intact w/o bruits. Neuro:  No focal neuro deficits; +gait abnormality... Derm:  No lesions noted; no rash etc. Lymph:  No cervical, supraclavicular, axillary, or inguinal adenopathy  palpated.   Assessment:      IMP/PLAN>>   04/11/15>   [name] [name] has severe multisys dis w/ severe COPD & not on max therapy- in this regard we discussed the NEED for medication compliance- use CPAP nightly, O2 continuously, Symbicort160-2spBid, add SPIRIVA daily, increase MUCINEX600-2Bid, and ProairHFA rescue inhaler as needed... EXAM reveals AS & 2DEcho confirms SEVERE AS & he does not have a cardiologist => refer to cards ASAP... We plan recheck 11mo 12/27/15>   We reviewed diet & exercise prescriptions to aid in wt reduction;  We decided to change his MDIs to NEB using DUONEB Tid followed by PULMICORT 0.25 Tid;  We will also request [employer] to check pt's mask in light of signif air leak on his download data => he has f/u DrSood, Sleep Med planned for 02/04/16...     Plan:     Patient's Medications  New Prescriptions   BUDESONIDE (PULMICORT) 0.25 MG/2ML NEBULIZER SOLUTION  Take 2 mLs (0.25 mg total) by nebulization 3 (three) times daily.   IPRATROPIUM-ALBUTEROL (DUONEB) 0.5-2.5 (3) MG/3ML SOLN    Take 3 mLs by nebulization 3 (three) times daily.  Previous Medications   ASPIRIN EC 81 MG TABLET    Take 1 tablet (81 mg total) by mouth daily.   BUDESONIDE-FORMOTEROL (SYMBICORT) 160-4.5 MCG/ACT INHALER    Inhale 2 puffs into the lungs 2 (two) times daily.   CLOPIDOGREL (PLAVIX) 75 MG TABLET    Take 1 tablet (75 mg total) by mouth daily with breakfast.   FUROSEMIDE (LASIX) 20 MG TABLET    Take 20 mg by mouth daily.   GUAIFENESIN (MUCINEX) 600 MG 12 HR TABLET    Take 600 mg by mouth 2 (two) times daily.    IBUPROFEN (ADVIL,MOTRIN) 200 MG TABLET    Take 200 mg by mouth every 6 (six) hours as needed (pain). Reported on 07/30/2015   LORAZEPAM (ATIVAN) 1 MG TABLET    Take 1/4 to 1/2 tablet by mouth every 6 hours as needed for anxiety or sleep.   METOPROLOL TARTRATE (LOPRESSOR) 25 MG TABLET    Take 0.5 tablets (12.5 mg total) by mouth 2 (two) times daily.   OXYGEN    Inhale into the lungs. 4 Liters    POLYETHYLENE GLYCOL POWDER (GLYCOLAX/MIRALAX) POWDER    Take 17 g by mouth daily as needed.   PROAIR HFA 108 (90 BASE) MCG/ACT INHALER    INHALE 1-2 PUFFS INTO THE LUNGS EVERY 6 (SIX) HOURS AS NEEDED FOR WHEEZING.   RABEPRAZOLE (ACIPHEX) 20 MG TABLET    Take 1 tablet (20 mg total) by mouth daily.   RESPIRATORY THERAPY SUPPLIES (FLUTTER) DEVI    Use as directed   SODIUM CHLORIDE (OCEAN) 0.65 % SOLN NASAL SPRAY    Place 2 sprays into both nostrils daily as needed for congestion.   Modified Medications   Modified Medication Previous Medication   BUDESONIDE-FORMOTEROL (SYMBICORT) 160-4.5 MCG/ACT INHALER budesonide-formoterol (SYMBICORT) 160-4.5 MCG/ACT inhaler      Inhale 2 puffs into the lungs 2 (two) times daily.    Inhale 2 puffs into the lungs 2 (two) times daily.  Discontinued Medications   TIOTROPIUM (SPIRIVA HANDIHALER) 18 MCG INHALATION CAPSULE    Place 1 capsule (18 mcg total) into inhaler and inhale daily.

## 2015-12-27 NOTE — Patient Instructions (Signed)
Today we updated your med list in our EPIC system...     We decided to change your inhalation medication from the inhalers to the NEBULIZER to try to save you money...    The inhalers are only covered under Medicare part D- subject to the "donut hole" where you now find yourself...    The nebulizer meds are covered under Medicare part B & usually is cheaper for the patient...  In this regard>  Start these 2 new meds via the NEBULIZER--    DUONEB -- use in machine 3 times daily... (use this first)    PULMICART (Budesonide) -- use in machine 3 times daily (after the Duoneb medication)  We checked your CPAP download & there is too great of an air0-leak from your mask and the CPAP isn't doing what it is supposed to do...    We will request [employer] to fix this w/ proper mask fit etc...  Call for any questions...  Keep your planned follow up appt w/ DrSood 02/04/16 at 10AM..Marland Kitchen

## 2015-12-28 DIAGNOSIS — J9610 Chronic respiratory failure, unspecified whether with hypoxia or hypercapnia: Secondary | ICD-10-CM | POA: Diagnosis not present

## 2015-12-28 DIAGNOSIS — I350 Nonrheumatic aortic (valve) stenosis: Secondary | ICD-10-CM | POA: Diagnosis not present

## 2015-12-28 DIAGNOSIS — J449 Chronic obstructive pulmonary disease, unspecified: Secondary | ICD-10-CM | POA: Diagnosis not present

## 2015-12-28 DIAGNOSIS — G4733 Obstructive sleep apnea (adult) (pediatric): Secondary | ICD-10-CM | POA: Diagnosis not present

## 2016-01-01 ENCOUNTER — Telehealth: Payer: Self-pay | Admitting: *Deleted

## 2016-01-01 DIAGNOSIS — C44529 Squamous cell carcinoma of skin of other part of trunk: Secondary | ICD-10-CM | POA: Diagnosis not present

## 2016-01-01 DIAGNOSIS — D692 Other nonthrombocytopenic purpura: Secondary | ICD-10-CM | POA: Diagnosis not present

## 2016-01-01 DIAGNOSIS — D045 Carcinoma in situ of skin of trunk: Secondary | ICD-10-CM | POA: Diagnosis not present

## 2016-01-01 DIAGNOSIS — L821 Other seborrheic keratosis: Secondary | ICD-10-CM | POA: Diagnosis not present

## 2016-01-01 NOTE — Telephone Encounter (Signed)
Received fax from [place]. They are needing last OV note  by SN regarding pt neb medications to do PA. This note is not done yet. Once done, this will need to be faxed to [phone]. Pt order is on hold until this is received. Please advise once done thanks

## 2016-01-03 NOTE — Telephone Encounter (Signed)
Patient calling to check on his meds.  Advised him that I would send copy of note to [place] and they would send his medications. Faxed OV note to [place]. Nothing further needed.

## 2016-01-03 NOTE — Telephone Encounter (Signed)
Pt is now calling about his neb meds [phone]

## 2016-01-04 ENCOUNTER — Telehealth: Payer: Self-pay | Admitting: Pulmonary Disease

## 2016-01-04 DIAGNOSIS — Z9989 Dependence on other enabling machines and devices: Principal | ICD-10-CM

## 2016-01-04 DIAGNOSIS — G4733 Obstructive sleep apnea (adult) (pediatric): Secondary | ICD-10-CM

## 2016-01-04 NOTE — Telephone Encounter (Signed)
Results have been explained to patient, pt expressed understanding. Nothing further needed.  

## 2016-01-04 NOTE — Telephone Encounter (Signed)
CPAP 11/27/15 to 12/26/15 >> used on 30 of 30 nights with average 9 hrs 10 min.  Average AHI 14 with CPAP 11 cm H2O.  Will have my nurse inform pt that CPAP report shows sleep apnea number is still elevated.  I have sent order to increase his pressure setting to 12 cm H2O.  He should call back if he notices trouble with his sleep after pressure change.

## 2016-01-07 DIAGNOSIS — D045 Carcinoma in situ of skin of trunk: Secondary | ICD-10-CM | POA: Diagnosis not present

## 2016-01-09 DIAGNOSIS — J449 Chronic obstructive pulmonary disease, unspecified: Secondary | ICD-10-CM | POA: Diagnosis not present

## 2016-01-17 DIAGNOSIS — J449 Chronic obstructive pulmonary disease, unspecified: Secondary | ICD-10-CM | POA: Diagnosis not present

## 2016-01-17 DIAGNOSIS — J438 Other emphysema: Secondary | ICD-10-CM | POA: Diagnosis not present

## 2016-01-23 DIAGNOSIS — J449 Chronic obstructive pulmonary disease, unspecified: Secondary | ICD-10-CM | POA: Diagnosis not present

## 2016-01-24 DIAGNOSIS — G4733 Obstructive sleep apnea (adult) (pediatric): Secondary | ICD-10-CM | POA: Diagnosis not present

## 2016-01-24 DIAGNOSIS — J449 Chronic obstructive pulmonary disease, unspecified: Secondary | ICD-10-CM | POA: Diagnosis not present

## 2016-02-04 ENCOUNTER — Encounter (INDEPENDENT_AMBULATORY_CARE_PROVIDER_SITE_OTHER): Payer: Self-pay

## 2016-02-04 ENCOUNTER — Ambulatory Visit (INDEPENDENT_AMBULATORY_CARE_PROVIDER_SITE_OTHER): Payer: PPO | Admitting: Pulmonary Disease

## 2016-02-04 ENCOUNTER — Encounter: Payer: Self-pay | Admitting: Pulmonary Disease

## 2016-02-04 VITALS — BP 130/78 | HR 78 | Ht 70.0 in | Wt 271.2 lb

## 2016-02-04 DIAGNOSIS — G4733 Obstructive sleep apnea (adult) (pediatric): Secondary | ICD-10-CM

## 2016-02-04 DIAGNOSIS — E662 Morbid (severe) obesity with alveolar hypoventilation: Secondary | ICD-10-CM | POA: Diagnosis not present

## 2016-02-04 DIAGNOSIS — J449 Chronic obstructive pulmonary disease, unspecified: Secondary | ICD-10-CM | POA: Diagnosis not present

## 2016-02-04 DIAGNOSIS — J9611 Chronic respiratory failure with hypoxia: Secondary | ICD-10-CM | POA: Diagnosis not present

## 2016-02-04 DIAGNOSIS — Z9989 Dependence on other enabling machines and devices: Secondary | ICD-10-CM

## 2016-02-04 MED ORDER — BUDESONIDE-FORMOTEROL FUMARATE 160-4.5 MCG/ACT IN AERO: 2.0000 | INHALATION_SPRAY | Freq: Two times a day (BID) | RESPIRATORY_TRACT | 5 refills | Status: DC

## 2016-02-04 NOTE — Progress Notes (Signed)
Current Outpatient Prescriptions on File Prior to Visit  Medication Sig  . aspirin EC 81 MG tablet Take 1 tablet (81 mg total) by mouth daily.  . budesonide (PULMICORT) 0.25 MG/2ML nebulizer solution Take 2 mLs (0.25 mg total) by nebulization 3 (three) times daily.  . clopidogrel (PLAVIX) 75 MG tablet Take 1 tablet (75 mg total) by mouth daily with breakfast.  . furosemide (LASIX) 20 MG tablet Take 20 mg by mouth daily.  Marland Kitchen guaiFENesin (MUCINEX) 600 MG 12 hr tablet Take 600 mg by mouth 2 (two) times daily.   Marland Kitchen ibuprofen (ADVIL,MOTRIN) 200 MG tablet Take 200 mg by mouth every 6 (six) hours as needed (pain). Reported on 07/30/2015  . ipratropium-albuterol (DUONEB) 0.5-2.5 (3) MG/3ML SOLN Take 3 mLs by nebulization 3 (three) times daily.  Marland Kitchen LORazepam (ATIVAN) 1 MG tablet Take 1/4 to 1/2 tablet by mouth every 6 hours as needed for anxiety or sleep.  . metoprolol tartrate (LOPRESSOR) 25 MG tablet Take 0.5 tablets (12.5 mg total) by mouth 2 (two) times daily.  . OXYGEN Inhale into the lungs. 4 Liters  . polyethylene glycol powder (GLYCOLAX/MIRALAX) powder Take 17 g by mouth daily as needed.  Marland Kitchen PROAIR HFA 108 (90 BASE) MCG/ACT inhaler INHALE 1-2 PUFFS INTO THE LUNGS EVERY 6 (SIX) HOURS AS NEEDED FOR WHEEZING.  . RABEprazole (ACIPHEX) 20 MG tablet Take 1 tablet (20 mg total) by mouth daily.  Marland Kitchen Respiratory Therapy Supplies (FLUTTER) DEVI Use as directed  . sodium chloride (OCEAN) 0.65 % SOLN nasal spray Place 2 sprays into both nostrils daily as needed for congestion.   . budesonide-formoterol (SYMBICORT) 160-4.5 MCG/ACT inhaler Inhale 2 puffs into the lungs 2 (two) times daily. (Patient not taking: Reported on 02/04/2016)  . Tiotropium Bromide Monohydrate (SPIRIVA RESPIMAT) 1.25 MCG/ACT AERS Inhale 2 puffs into the lungs daily. (Patient not taking: Reported on 02/04/2016)   No current facility-administered medications on file prior to visit.      Chief Complaint  Patient presents with  . Follow-up   Pt notes some increased SOB and wheezing at times. Pt's meds were changed last OV from inhalers to neb tx - pt states that the nebs do not seem to work as well as the inhalers did. Pt wanting to go back on Symbicort but NOT the Spiriva - did not feel that the Spiriva worked well for his breathing. Pt states that [employer] did not give him another mask - pt was told to just tighten down the straps so that the mask would not leak. DME: [facility]     Sleep tests PSG 12/13/99 >> AHI 33  Pulmonary tests Spirometry 04/11/15 >> FEV1 0.77 (24%), FEV1% 50 CT chest 05/22/15 >> 4 mm RUL nodule  Cardiac tests Rt/Lt heart cath 05/15/15 >> severe AS, mild pulmonary hypertension Echo 07/30/15 >> mild LVH, EF 55 to [id number], grade 1 diastolic dysfx  Past medical hx Anxiety, PUD, Diverticulosis, Nephrolithiasis, AAA, severe AS s/p TAVR January 2017  Past surgical hx, Allergies, Family hx, Social hx all reviewed.  Vital Signs BP 130/78 (BP Location: Left Arm, Cuff Size: Normal)   Pulse 78   Ht 5\' 10"  (1.778 m)   Wt 271 lb 3.2 oz (123 kg)   SpO2 92%   BMI 38.91 kg/m   History of Present Illness [name] [name] is a 82 y.o. male former smoker with OSA, COPD, and chronic respiratory failure.  He doesn't feel like nebulizer therapy is as effective as using symbicort.  He didn't think spiriva  helped.  He is having occasional cough and wheeze.  Bringing up clear sputum.  Denies chest pain, or leg swelling.    He was advised by [employer] to adjust his CPAP mask >> fits much better, and sleeping better.  Physical Exam  General - No distress ENT - No sinus tenderness, no oral exudate, no LAN Cardiac - s1s2 regular, 2/6 murmur Chest - No wheeze/rales/dullness Back - No focal tenderness Abd - Soft, non-tender Ext - No edema Neuro - Normal strength Skin - No rashes Psych - normal mood, and behavior   Assessment/Plan  COPD. - didn't feel LAMA's were benefitial - resume symbicort - continue prn albuterol - d/c  pulmicort nebulizer  Obstructive sleep apnea. - continue CPAP   Chronic respiratory failure with hypoxia. - continue supplemental oxygen >> re-assess at next visit   Patient Instructions  Symbicort two puffs twice per day  Follow up in 6 months   Chesley Mires, MD [facility] Pager:  [phone] 02/04/16, 10:34AM

## 2016-02-04 NOTE — Patient Instructions (Signed)
Symbicort two puffs twice per day  Follow up in 6 months

## 2016-02-07 MED ORDER — BUDESONIDE-FORMOTEROL FUMARATE 160-4.5 MCG/ACT IN AERO: 2.0000 | INHALATION_SPRAY | Freq: Two times a day (BID) | RESPIRATORY_TRACT | 0 refills | Status: DC

## 2016-02-07 NOTE — Addendum Note (Signed)
Addended by: Virl Cagey on: 02/07/2016 02:21 PM   Modules accepted: Orders

## 2016-02-09 DIAGNOSIS — J449 Chronic obstructive pulmonary disease, unspecified: Secondary | ICD-10-CM | POA: Diagnosis not present

## 2016-02-23 DIAGNOSIS — J449 Chronic obstructive pulmonary disease, unspecified: Secondary | ICD-10-CM | POA: Diagnosis not present

## 2016-02-24 DIAGNOSIS — G4733 Obstructive sleep apnea (adult) (pediatric): Secondary | ICD-10-CM | POA: Diagnosis not present

## 2016-02-24 DIAGNOSIS — J449 Chronic obstructive pulmonary disease, unspecified: Secondary | ICD-10-CM | POA: Diagnosis not present

## 2016-02-26 ENCOUNTER — Other Ambulatory Visit: Payer: Self-pay | Admitting: Cardiovascular Disease

## 2016-03-11 DIAGNOSIS — J449 Chronic obstructive pulmonary disease, unspecified: Secondary | ICD-10-CM | POA: Diagnosis not present

## 2016-03-14 ENCOUNTER — Other Ambulatory Visit: Payer: Self-pay | Admitting: Family Medicine

## 2016-03-14 NOTE — Telephone Encounter (Signed)
Last refill 07/18/2015 #30 +2, last OV 09/14/15. Ok to refill?

## 2016-03-16 NOTE — Telephone Encounter (Signed)
Please call in.  Thanks.   

## 2016-03-17 NOTE — Telephone Encounter (Signed)
Medication phoned to pharmacy.  

## 2016-03-20 DIAGNOSIS — Z961 Presence of intraocular lens: Secondary | ICD-10-CM | POA: Diagnosis not present

## 2016-03-24 DIAGNOSIS — J449 Chronic obstructive pulmonary disease, unspecified: Secondary | ICD-10-CM | POA: Diagnosis not present

## 2016-03-25 DIAGNOSIS — J449 Chronic obstructive pulmonary disease, unspecified: Secondary | ICD-10-CM | POA: Diagnosis not present

## 2016-03-25 DIAGNOSIS — G4733 Obstructive sleep apnea (adult) (pediatric): Secondary | ICD-10-CM | POA: Diagnosis not present

## 2016-04-10 DIAGNOSIS — J449 Chronic obstructive pulmonary disease, unspecified: Secondary | ICD-10-CM | POA: Diagnosis not present

## 2016-04-11 ENCOUNTER — Encounter: Payer: Self-pay | Admitting: Cardiovascular Disease

## 2016-04-11 ENCOUNTER — Ambulatory Visit (INDEPENDENT_AMBULATORY_CARE_PROVIDER_SITE_OTHER): Payer: PPO | Admitting: Cardiovascular Disease

## 2016-04-11 ENCOUNTER — Encounter (INDEPENDENT_AMBULATORY_CARE_PROVIDER_SITE_OTHER): Payer: Self-pay

## 2016-04-11 VITALS — BP 150/74 | HR 84 | Ht 70.0 in | Wt 271.8 lb

## 2016-04-11 DIAGNOSIS — Z952 Presence of prosthetic heart valve: Secondary | ICD-10-CM | POA: Diagnosis not present

## 2016-04-11 NOTE — Patient Instructions (Addendum)
Medication Instructions:  1) STOP Plavix   Labwork: None  Testing/Procedures: Your physician has requested that you have an echocardiogram in January to follow up on TAVR. Echocardiography is a painless test that uses sound waves to create images of your heart. It provides your doctor with information about the size and shape of your heart and how well your heart's chambers and valves are working. This procedure takes approximately one hour. There are no restrictions for this procedure.    Follow-Up: Your physician recommends that you schedule a follow-up appointment in: January with Richardson Dopp, PA-C. (same day as echo)   Any Other Special Instructions Will Be Listed Below (If Applicable).     If you need a refill on your cardiac medications before your next appointment, please call your pharmacy.

## 2016-04-11 NOTE — Progress Notes (Signed)
Cardiology Office Note Date:  04/13/2016   ID:  [name] [name], DOB 1934/01/13, MRN [id number]  PCP:  [name], MD  Cardiologist:  [name], MD    Chief Complaint  Patient presents with  . Aortic Stenosis     History of Present Illness: [name] [name] is a 82 y.o. male who presents for follow-up of aortic valve disease and chronic diastolic heart failure. He was noted to have a mean transaortic valve gradient of 51 mmHg. He underwent further evaluation including cardiac catheterization and CT angiography. He was noted to have a single coronary artery without obstructive disease at cath. After review of all data with the multidisciplinary heart valve team, we proceeded with transfemoral TAVR using a 26 mm transcatheter heart valve on 06/26/2015. Post-operatively the patient has mildly elevated transvalvular gradients with trivial AI.  He is here with his wife today. Overall doing a little better, now wearing home O2 less frequently. Breathing is improved compared with his preoperative baseline. No chest pain or leg swelling. No orthopnea or PND.    Past Medical History:  Diagnosis Date  . AAA (abdominal aortic aneurysm) (HCC)    3.8 cm CTA 05/2015  . Anxiety   . COPD (chronic obstructive pulmonary disease) (HCC)    on home O2 4LPM  . Diverticulosis   . Former smoker    quit 2001  . Hemorrhoids   . Kidney stones    passed spontaneously  . OSA on CPAP    last study- in the home possibility- 2015, pt. unsure   . Prediabetes    Hgb A1C 5.8 in Jan 2017  . PUD (peptic ulcer disease)   . Right knee DJD 10/31/2011   Severe  . S/P TAVR (transcatheter aortic valve replacement) 06/26/2015   26 mm Edwards Sapien 3 transcatheter heart valve placed via percutaneous right transfemoral approach  . Severe aortic stenosis    a. Echo 11/16:  Mod LVH, EF 55-60%, no RWMA, Gr 1 DD, severe AS (mean 51 mmHg; peak 86 mmHg), MAC, mild LAE  b. s/p TAVR on 06/26/2015 with 52mm Edward Sapien 3  THV  . Shortness of breath dyspnea     Past Surgical History:  Procedure Laterality Date  . CARDIAC CATHETERIZATION N/A 05/15/2015   Procedure: Right/Left Heart Cath and Coronary Angiography;  Surgeon: [name], MD;  Location: [facility];  Service: Cardiovascular;  Laterality: N/A;  . CHOLECYSTECTOMY    . EYE SURGERY     bilateral cataracts removed, /w IOL  . KNEE SURGERY Right 1985   arthrosopic -   . TEE WITHOUT CARDIOVERSION N/A 06/26/2015   Procedure: TRANSESOPHAGEAL ECHOCARDIOGRAM (TEE);  Surgeon: [name], MD;  Location: [facility];  Service: Open Heart Surgery;  Laterality: N/A;  . TONSILLECTOMY    . TRANSCATHETER AORTIC VALVE REPLACEMENT, TRANSFEMORAL N/A 06/26/2015   Procedure: TRANSCATHETER AORTIC VALVE REPLACEMENT, TRANSFEMORAL;  Surgeon: [name], MD;  Location: [facility];  Service: Open Heart Surgery;  Laterality: N/A;    Current Outpatient Prescriptions  Medication Sig Dispense Refill  . aspirin EC 81 MG tablet Take 1 tablet (81 mg total) by mouth daily.    . budesonide-formoterol (SYMBICORT) 160-4.5 MCG/ACT inhaler Inhale 2 puffs into the lungs 2 (two) times daily. 10.2 Inhaler 5  . furosemide (LASIX) 20 MG tablet Take 20 mg by mouth daily.    Marland Kitchen guaiFENesin (MUCINEX) 600 MG 12 hr tablet Take 600 mg by mouth 2 (two) times daily.     Marland Kitchen  ibuprofen (ADVIL,MOTRIN) 200 MG tablet Take 200 mg by mouth every 6 (six) hours as needed (pain). Reported on 07/30/2015    . ipratropium-albuterol (DUONEB) 0.5-2.5 (3) MG/3ML SOLN Take 3 mLs by nebulization 3 (three) times daily. 360 mL 11  . LORazepam (ATIVAN) 1 MG tablet Take 0.25-0.5 tablets by mouth every 6 (six) hours as needed for anxiety.    . metoprolol tartrate (LOPRESSOR) 25 MG tablet Take 0.5 tablets (12.5 mg total) by mouth 2 (two) times daily. 90 tablet 3  . OXYGEN Inhale into the lungs. 4 Liters    . polyethylene glycol (MIRALAX / GLYCOLAX) packet Take 17 g by mouth daily as needed (constiptation).    Marland Kitchen PROAIR  HFA 108 (90 BASE) MCG/ACT inhaler INHALE 1-2 PUFFS INTO THE LUNGS EVERY 6 (SIX) HOURS AS NEEDED FOR WHEEZING. 8.5 each 3  . RABEprazole (ACIPHEX) 20 MG tablet Take 1 tablet (20 mg total) by mouth daily. 30 tablet 8  . Respiratory Therapy Supplies (FLUTTER) DEVI Use as directed 1 each 0  . sodium chloride (OCEAN) 0.65 % SOLN nasal spray Place 2 sprays into both nostrils daily as needed for congestion.      No current facility-administered medications for this visit.     Allergies:   Iohexol; Atorvastatin; Celecoxib; Clinoril [sulindac]; Nitrofurantoin; and Sulfa antibiotics   Social History:  The patient  reports that he quit smoking about 16 years ago. His smoking use included Cigarettes. He has a 79.50 pack-year smoking history. He has quit using smokeless tobacco. His smokeless tobacco use included Snuff and Chew. He reports that he does not drink alcohol or use drugs.   Family History:  The patient's  family history includes Heart attack (age of onset: 77) in his mother; Prostate cancer (age of onset: 30) in his father; Stroke in his mother.    ROS:  Please see the history of present illness.  Otherwise, review of systems is positive for fatigue.  All other systems are reviewed and negative.    PHYSICAL EXAM: VS:  BP (!) 150/74   Pulse 84   Ht 5\' 10"  (1.778 m)   Wt 271 lb 12.8 oz (123.3 kg)   BMI 39.00 kg/m  , BMI Body mass index is 39 kg/m. GEN: Elderly overweight male, in no acute distress  HEENT: normal  Neck: no JVD, no masses. No carotid bruits Cardiac: RRR with 2/6 SEM at the RUSB         Respiratory:  Bilateral end expiratory wheezing, normal work of breathing GI: soft, nontender, nondistended, + BS MS: no deformity or atrophy  Ext: no pretibial edema, pedal pulses 2+= bilaterally Skin: warm and dry, no rash Neuro:  Strength and sensation are intact Psych: euthymic mood, full affect  EKG:  EKG is ordered today. The ekg ordered today shows NSR 84 bpm, incomplete RBBB,  nonspecific ST abnormality  Recent Labs: 06/22/2015: ALT 15 06/27/2015: Magnesium 1.5 07/05/2015: Hemoglobin 11.5; Platelets 242 08/09/2015: BUN 12; Creatinine, Ser 0.81; Potassium 4.6; Sodium 141   Lipid Panel     Component Value Date/Time   CHOL 173 08/09/2015 0859   TRIG 90.0 08/09/2015 0859   HDL 48.50 08/09/2015 0859   CHOLHDL 4 08/09/2015 0859   VLDL 18.0 08/09/2015 0859   LDLCALC 107 (H) 08/09/2015 0859      Wt Readings from Last 3 Encounters:  04/11/16 271 lb 12.8 oz (123.3 kg)  02/04/16 271 lb 3.2 oz (123 kg)  12/27/15 282 lb (127.9 kg)     Cardiac  Studies Reviewed: Echo 07/30/2015: Left ventricle:  The cavity size was normal. Wall thickness was increased in a pattern of mild LVH. Systolic function was normal. The estimated ejection fraction was in the range of 55% to 60%. Doppler parameters are consistent with abnormal left ventricular relaxation (grade 1 diastolic dysfunction).  ------------------------------------------------------------------- Aortic valve:  AV prosthesis is not well seen Peak and mean gradients through the valve are 42 and 23 mm Hg respectively. Doppler:  There was trivial regurgitation.    VTI ratio of LVOT to aortic valve: 0.24. Valve area (VTI): 0.92 cm^2. Indexed valve area (VTI): 0.37 cm^2/m^2. Peak velocity ratio of LVOT to aortic valve: 0.25. Valve area (Vmax): 0.93 cm^2. Indexed valve area (Vmax): 0.37 cm^2/m^2. Mean velocity ratio of LVOT to aortic valve: 0.24. Valve area (Vmean): 0.9 cm^2. Indexed valve area (Vmean): 0.36 cm^2/m^2.   Mean gradient (S): 23 mm Hg. Peak gradient (S): 42 mm Hg.  ------------------------------------------------------------------- Aorta:  Aortic root is difficult to see well Appears mildly ilated at 43 mm.  ------------------------------------------------------------------- Mitral valve:   Calcified annulus. Mildly thickened leaflets . Doppler:  There was trivial regurgitation.    Peak gradient (D):  4 mm Hg.  ------------------------------------------------------------------- Left atrium:  The atrium was mildly dilated.  ------------------------------------------------------------------- Right ventricle:  The cavity size was normal. Wall thickness was normal. Systolic function was normal.  ------------------------------------------------------------------- Tricuspid valve:   Structurally normal valve.   Leaflet separation was normal.  Doppler:  Transvalvular velocity was within the normal range. There was trivial regurgitation.  ------------------------------------------------------------------- Right atrium:  The atrium was normal in size.  ------------------------------------------------------------------- Pericardium:  A trivial pericardial effusion was identified.  ------------------------------------------------------------------- Systemic veins: Inferior vena cava: The vessel was normal in size. The respirophasic diameter changes were in the normal range (= 50%), consistent with normal central venous pressure.  ASSESSMENT AND PLAN: 1.  Aortic valve disease s/p TAVR: NYHA II sx's of chronic diastolic CHF. The patient has some increase in gradient across his aortic valve prosthesis without significant AI and with stable clinical findings. Recommend stop plavix and continue on ASA 81 mg daily.  2. Chronic diastolic CHF: continue current Rx. Appears euvolemic on exam.   Current medicines are reviewed with the patient today.  The patient does not have concerns regarding medicines.  Labs/ tests ordered today include:   Orders Placed This Encounter  Procedures  . EKG 12-Lead  . ECHOCARDIOGRAM COMPLETE    Disposition:   FU January with 12 month TAVR echo and appt with Richardson Dopp, PA-C at that time.   Deatra James, MD  04/13/2016 9:19 PM    [address], [place], [place]  [address] Phone: [phone]; Fax:  [phone]

## 2016-04-22 IMAGING — CT CT CHEST W/O CM
2 of 3 series · 15 of 36 positions shown, 18 images · non-contrast
Comparison: Chest CT 03/03/2006

CLINICAL DATA: Worsening shortness of breath with exertion and
hypoxia for the past month. History of emphysema on oxygen.

EXAM:
CT CHEST WITHOUT CONTRAST
TECHNIQUE: Multidetector CT imaging of the chest was performed following the
standard protocol without IV contrast.

[Series 2: chest routine with · axial · 0.81mm/px · z∈[-322,-42]mm · 12 of 66 slices shown, 15 images]
[im 5/66  mediastinal]
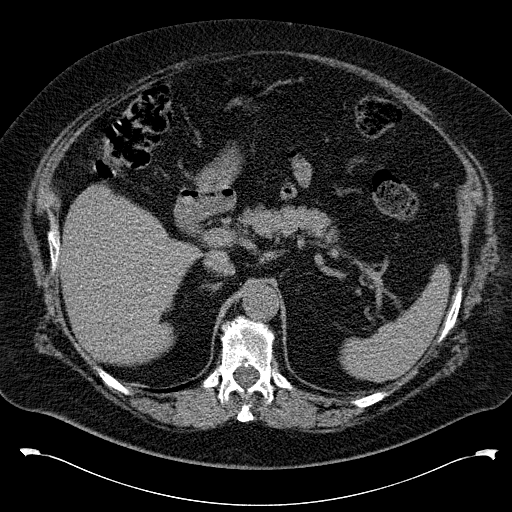
[im 5/66  lung]
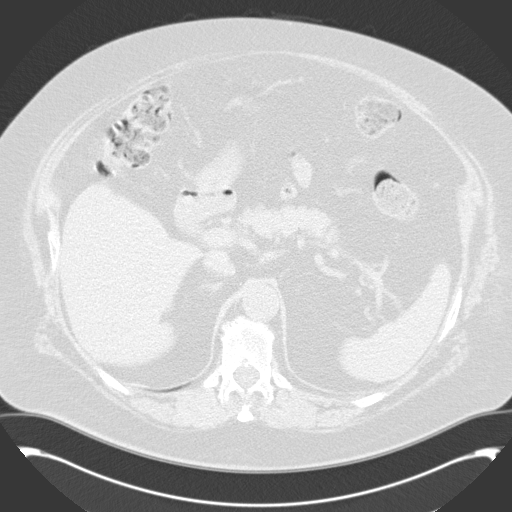
[im 10/66  lung]
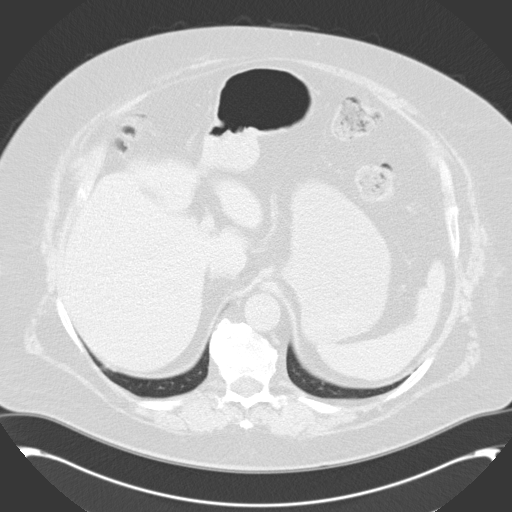
[im 15/66  lung]
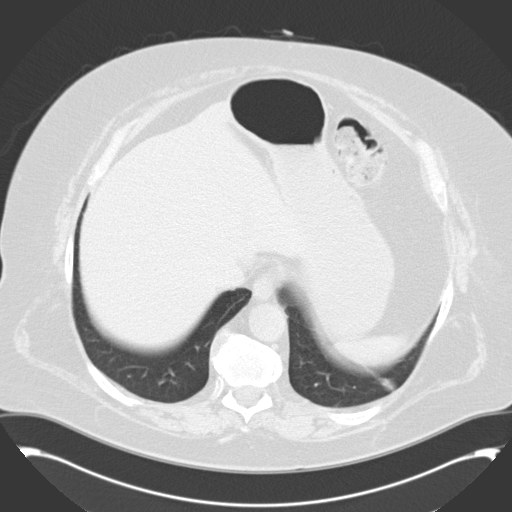
[im 20/66  lung]
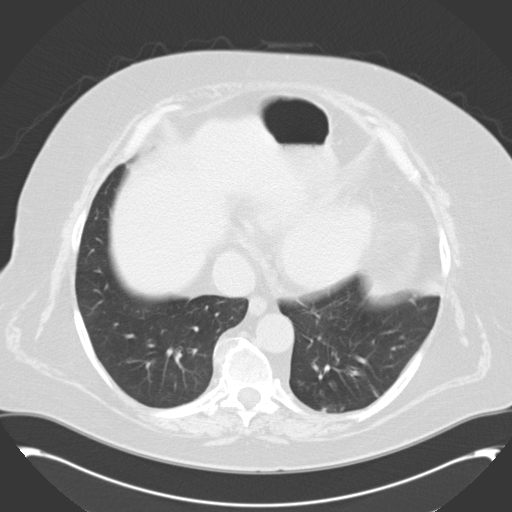
[im 25/66  mediastinal]
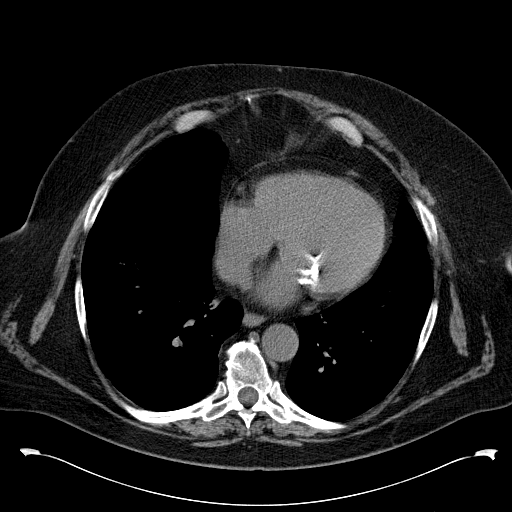
[im 25/66  lung]
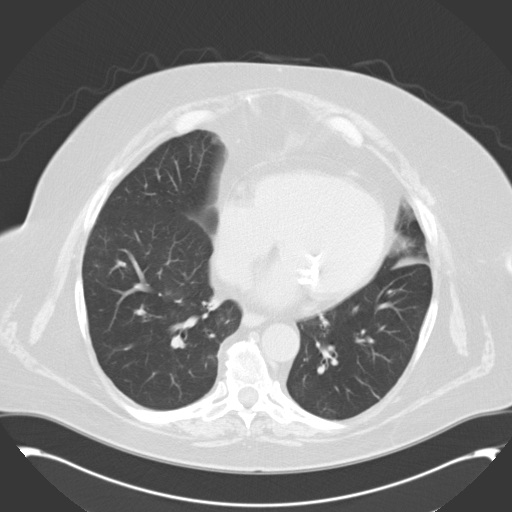
[im 29/66  lung]
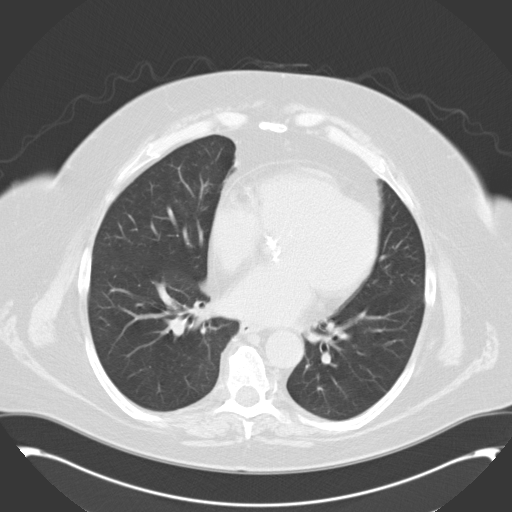
[im 37/66  lung]
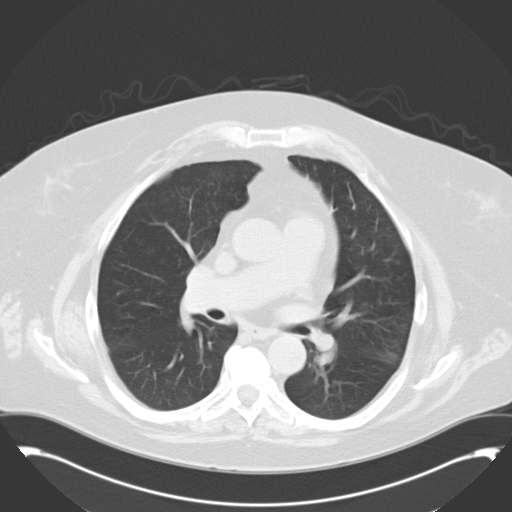
[im 41/66  lung]
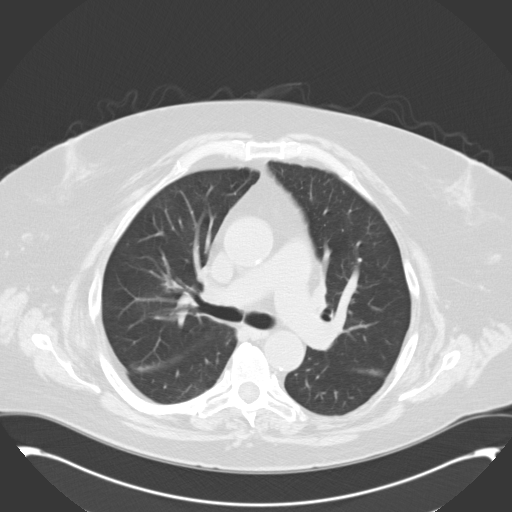
[im 46/66  mediastinal]
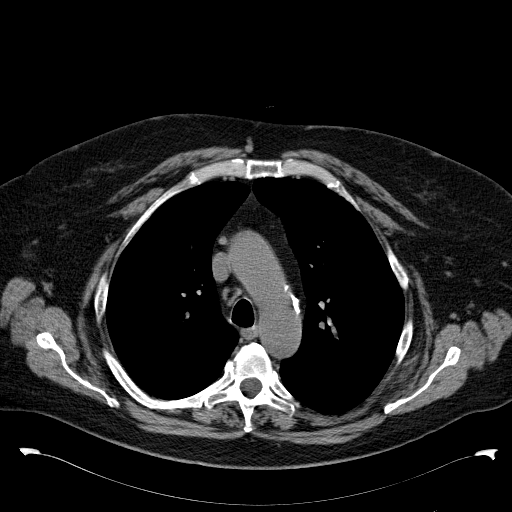
[im 46/66  lung]
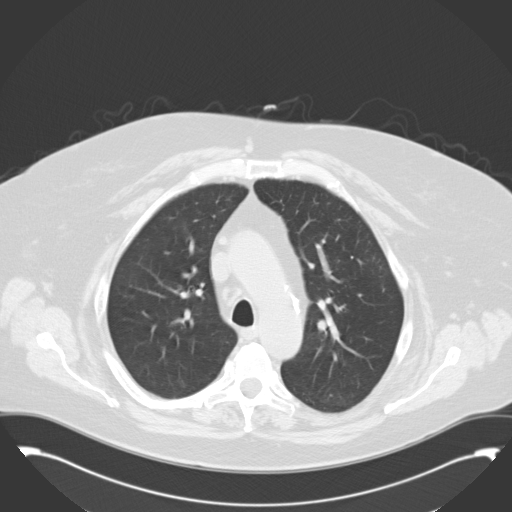
[im 51/66  lung]
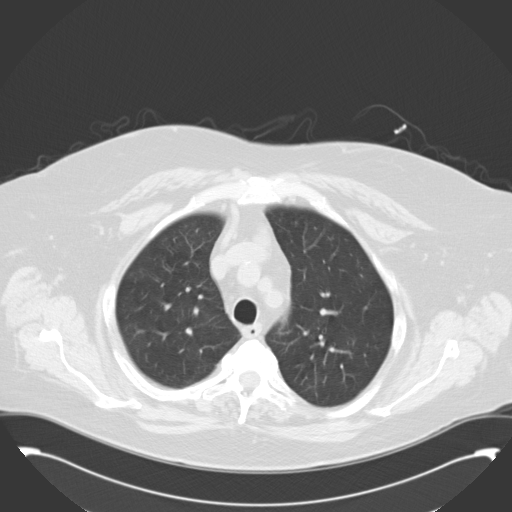
[im 56/66  lung]
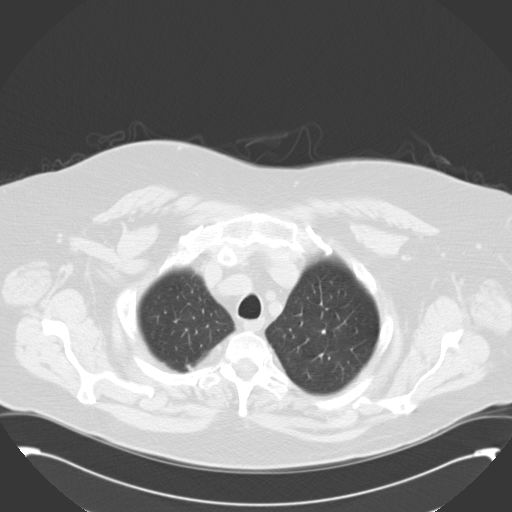
[im 61/66  lung]
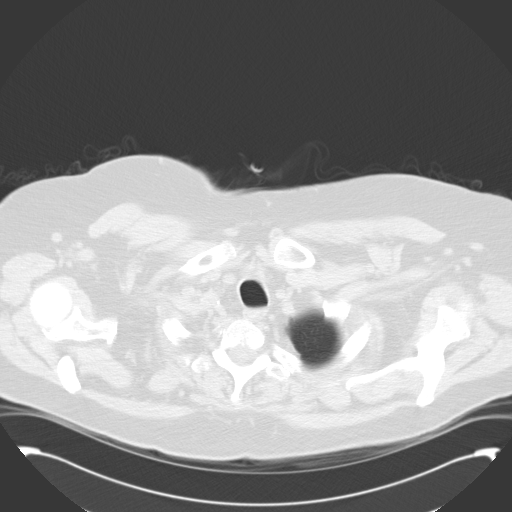

[Series 602: cor · coronal · 0.81mm/px · 3 of 139 slices shown]
[im 28/139  lung]
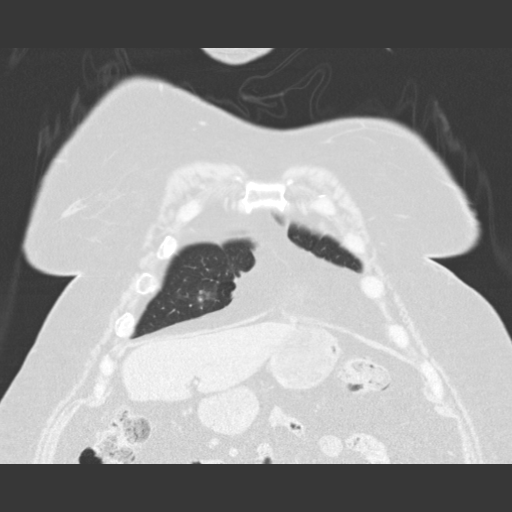
[im 56/139  lung]
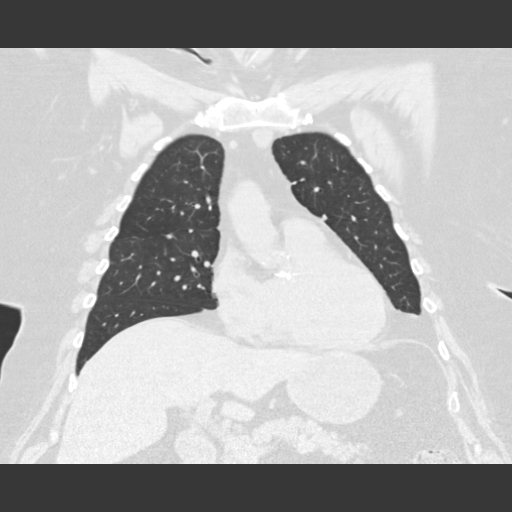
[im 83/139  lung]
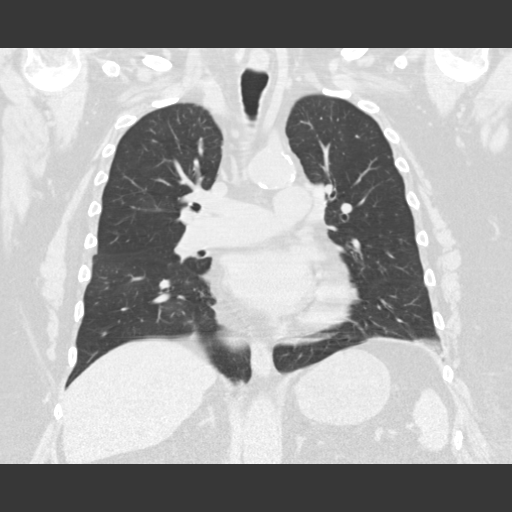

[15 of 36 positions shown; findings below may reference images not displayed]

FINDINGS: Mediastinum/Nodes: No breast masses, supraclavicular or axillary
lymphadenopathy.

The heart is normal in size. No pericardial effusion. The aorta
demonstrates scattered atherosclerotic calcifications but no focal
aneurysm. Dense calcifications are noted at the aortic valve region
and also in the mitral valve annulus.

There is a anterior mediastinal nodule on image number 14 which
measures 19 mm. This was also present back in 0663. It measured
mm on that study. This most likely a benign residual thymic cyst.

No mediastinal or hilar adenopathy otherwise. The esophagus is
grossly normal.

Lungs/Pleura: Patchy tree-in-bud appearance in the right middle lobe
is most likely under inflammatory process or atypical infection such
as ANEL. No focal airspace consolidation or pulmonary edema. No
pleural effusions. Linear scarring changes noted at the left lung
base. Areas of dependent subpleural atelectasis are noted. No
worrisome pulmonary lesions. The tracheobronchial tree is
unremarkable.

Upper abdomen: No significant upper abdominal findings.

Musculoskeletal: No significant bony findings. Moderate degenerative
changes involving the thoracic spine.
IMPRESSION: 1. Suspect inflammation or atypical infection in the right middle
lobe, possibly ANEL.
2. No worrisome pulmonary lesions.
3. Slight interval enlargement of an anterior mediastinal nodule
(since 0663). This is likely a benign complex thymic cyst.
4. Dense calcifications noted at the aortic valve and mitral valve
annulus.

## 2016-04-24 DIAGNOSIS — J449 Chronic obstructive pulmonary disease, unspecified: Secondary | ICD-10-CM | POA: Diagnosis not present

## 2016-04-25 DIAGNOSIS — J449 Chronic obstructive pulmonary disease, unspecified: Secondary | ICD-10-CM | POA: Diagnosis not present

## 2016-04-25 DIAGNOSIS — G4733 Obstructive sleep apnea (adult) (pediatric): Secondary | ICD-10-CM | POA: Diagnosis not present

## 2016-05-06 ENCOUNTER — Other Ambulatory Visit: Payer: Self-pay | Admitting: Cardiovascular Disease

## 2016-05-11 DIAGNOSIS — J449 Chronic obstructive pulmonary disease, unspecified: Secondary | ICD-10-CM | POA: Diagnosis not present

## 2016-05-21 ENCOUNTER — Other Ambulatory Visit: Payer: Self-pay | Admitting: *Deleted

## 2016-05-21 MED ORDER — FUROSEMIDE 20 MG PO TABS: 20.0000 mg | ORAL_TABLET | Freq: Every day | ORAL | 10 refills | Status: DC

## 2016-05-24 DIAGNOSIS — J449 Chronic obstructive pulmonary disease, unspecified: Secondary | ICD-10-CM | POA: Diagnosis not present

## 2016-05-25 DIAGNOSIS — J449 Chronic obstructive pulmonary disease, unspecified: Secondary | ICD-10-CM | POA: Diagnosis not present

## 2016-05-25 DIAGNOSIS — G4733 Obstructive sleep apnea (adult) (pediatric): Secondary | ICD-10-CM | POA: Diagnosis not present

## 2016-05-26 IMAGING — CT CT ANGIO CHEST
1 of 9 series · 1 of 36 positions shown · IV contrast (omnipaque)
Comparison: Chest CT 04/18/2015.

CLINICAL DATA: 81-year-old male with history of severe aortic
stenosis. Preprocedural study prior to potential transcatheter
aortic valve replacement (TAVR) procedure.

EXAM:
CT ANGIOGRAPHY CHEST, ABDOMEN AND PELVIS
TECHNIQUE: Multidetector CT imaging through the chest, abdomen and pelvis was
performed using the standard protocol during bolus administration of
intravenous contrast. Multiplanar reconstructed images and MIPs were
obtained and reviewed to evaluate the vascular anatomy.
CONTRAST:  80mL OMNIPAQUE IOHEXOL 350 MG/ML SOLN

[Series 200: locator · axial · 0.59mm/px · 1 of 1 slices shown]
[im 1/1  lung]
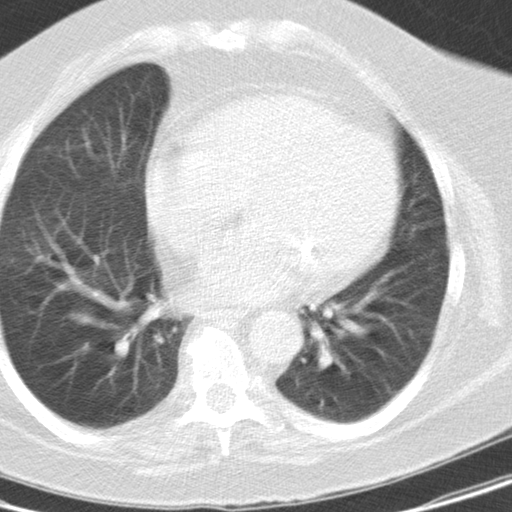

[1 of 36 positions shown; findings below may reference images not displayed]

FINDINGS: CTA CHEST FINDINGS

Mediastinum/Lymph Nodes: Heart size is borderline enlarged.
Concentric left ventricular hypertrophy. There is no significant
pericardial fluid, thickening or pericardial calcification. There is
atherosclerosis of the thoracic aorta, the great vessels of the
mediastinum and the coronary arteries, including calcified
atherosclerotic plaque in the right coronary artery. No
pathologically enlarged mediastinal or hilar lymph nodes. 1.9 x
cm lesion in the anterior mediastinum immediately anterior to the
left innominate vein only slightly larger than remote prior
examination from 03/03/2006, likely to represent a benign cystic
lesion, potentially of thymic origin. Esophagus is unremarkable in
appearance. No axillary lymphadenopathy.

Lungs/Pleura: 4 mm right upper lobe nodule (image 18 of series 407).
Linear scarring in the left lower lobe is unchanged. Small cluster
of peribronchovascular micronodules noted in the right middle lobe
on the prior study have resolved, presumably areas of benign mucoid
impaction on the prior examination. No acute consolidative airspace
disease. No pleural effusions.

Musculoskeletal/Soft Tissues: There are no aggressive appearing
lytic or blastic lesions noted in the visualized portions of the
skeleton.

CTA ABDOMEN AND PELVIS FINDINGS

Hepatobiliary: Diffuse low attenuation throughout the hepatic
parenchyma, compatible with hepatic steatosis. No cystic or solid
hepatic lesions. No intra or extrahepatic biliary ductal dilatation.
Status post cholecystectomy.

Pancreas: No pancreatic mass. No pancreatic ductal dilatation. No
pancreatic or peripancreatic fluid or inflammatory changes.

Spleen: Unremarkable.

Adrenals/Urinary Tract: Bilateral adrenal glands are normal in
appearance. Multiple low-attenuation lesions in the kidneys
bilaterally, the largest of which are compatible with simple cysts,
including the largest lesion which is exophytic measuring 6.5 cm in
the upper pole of the right kidney. Several other smaller sub cm
low-attenuation lesions are noted in the kidneys bilaterally, too
small to definitively characterize, but also favored to represent
tiny cysts. No hydroureteronephrosis. Urinary bladder is normal in
appearance.

Stomach/Bowel: Normal appearance of the stomach. Numerous colonic
diverticulae are noted, particularly in the sigmoid colon, without
surrounding inflammatory changes to suggest an acute diverticulitis
at this time. Normal appendix.

Vascular/Lymphatic: Extensive atherosclerosis throughout the
abdominal and pelvic vasculature, with vascular findings and
measurements pertinent to potential TAVR procedure, as detailed
below. In addition, there is fusiform aneurysmal dilatation of the
infrarenal abdominal aorta which measures up to 3.8 x 3.6 cm
immediately beneath the inferior mesenteric artery (IMA) origin.
Single renal arteries are noted bilaterally. Celiac axis, superior
mesenteric artery and inferior mesenteric artery are all widely
patent. No lymphadenopathy noted in the abdomen or pelvis.

Reproductive: Prostate gland and seminal vesicles are unremarkable
in appearance.

Other: No significant volume of ascites.  No pneumoperitoneum.

Musculoskeletal: There are no aggressive appearing lytic or blastic
lesions noted in the visualized portions of the skeleton.

VASCULAR MEASUREMENTS PERTINENT TO TAVR:

AORTA:

Minimal Aortic Diameter -  23 x 22 mm

Severity of Aortic Calcification -  moderate to severe

RIGHT PELVIS:

Right Common Iliac Artery -

Minimal Diameter - 12.3 x 12.5 mm

Tortuosity - mild

Calcification - mild

Right External Iliac Artery -

Minimal Diameter - 9.2 x 9.0 mm

Tortuosity - moderate

Calcification - none

Right Common Femoral Artery -

Minimal Diameter - 8.1 x 6.5 mm

Tortuosity - mild

Calcification - mild

LEFT PELVIS:

Left Common Iliac Artery -

Minimal Diameter - 11.8 x 9.6 mm

Tortuosity - mild

Calcification - moderate

Left External Iliac Artery -

Minimal Diameter - 9.7 x 9.4 mm

Tortuosity - moderate

Calcification - none

Left Common Femoral Artery -

Minimal Diameter - 10.1 x 9.6 mm

Tortuosity - mild

Calcification - mild

Review of the MIP images confirms the above findings.
IMPRESSION: 1. Vascular findings and measurements pertinent to potential TAVR
procedure, as detailed above. This patient does appear to have
suitable pelvic arterial access bilaterally.
2. Mild fusiform aneurysmal dilatation of the infrarenal abdominal
aorta which measures up to 3.8 x 3.6 cm. Recommend followup by
ultrasound in 2 years. This recommendation follows ACR consensus
guidelines: White Paper of the ACR Incidental Findings Committee II
on Vascular Findings. [HOSPITAL] 5562; [DATE].
3. Severe thickening calcification of the aortic valve, compatible
with the patient's reported clinical history of aortic stenosis.
4. Mild colonic diverticulosis without evidence of acute
diverticulitis at this time.
5. Hepatic steatosis.
6. Additional incidental findings, as above.

## 2016-05-26 IMAGING — CT CT HEART MORP W/ CTA COR W/ SCORE W/ CA W/CM &/OR W/O CM
1 of 15 series · 1 of 19 positions shown, 2 images · IV contrast (Iodine)
Comparison: Chest CT 04/18/2015.

CLINICAL DATA: Aortic stenosis

EXAM:
Cardiac TAVR CT
TECHNIQUE: The patient was scanned on a Philips 256 scanner. A 120 kV
retrospective scan was triggered in the descending thoracic aorta at
111 HU's. Gantry rotation speed was 270 msecs and collimation was .9
mm. 15 mg of iv metoprolol and 0.4 sl NTG were given. The 3D data
set was reconstructed in 5% intervals of the R-R cycle. Systolic and
diastolic phases were analyzed on a dedicated work station using
MPR, MIP and VRT modes. The patient received 80 cc of contrast.

[Series 300: locator · axial · 0.35mm/px · z∈[+639,+639]mm · 1 of 1 slices shown, 2 images]
[im 1/1  vessel]
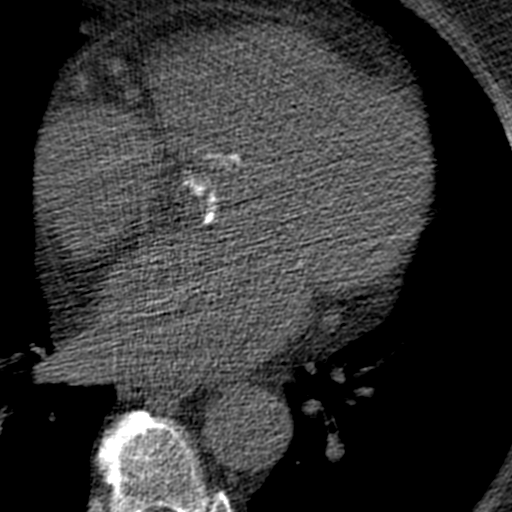
[im 1/1  lung]
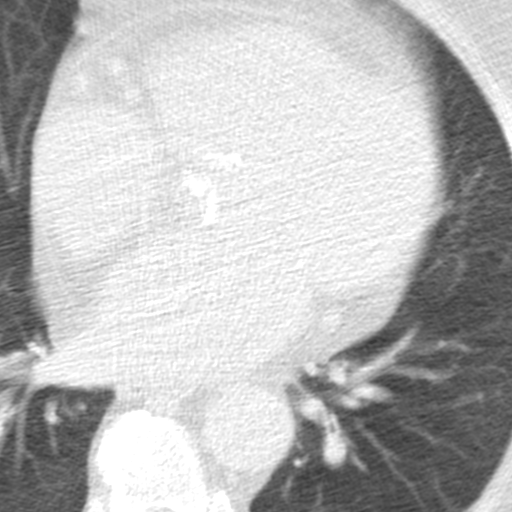

[1 of 19 positions shown; findings below may reference images not displayed]

FINDINGS: Aortic Valve: Severely thickened and calcified trileaflet aortic
valve with severely restricted leaflet opening and mild subvalvular
calcifications.

Aorta: Normal caliber. Mild diffuse calcifications. No dissection.

Sinotubular Junction:  30 x 28 mm

Ascending Thoracic Aorta:  37 x 36 mm

Aortic Arch:  32 x 31 mm

Descending Thoracic Aorta:  28 x 27 mm

Sinus of Valsalva Measurements:

Non-coronary:  31 mm

Right -coronary:  31 mm

Left -coronary:  30 mm

Coronary Artery Height above Annulus:

Left Main:  There is no left main artery

Right Coronary:  12 mm

Virtual Basal Annulus Measurements:

Maximum/Minimum Diameter:  28 x 25 mm

Perimeter:  92 mm

Area:  548 mm2

Coronary Arteries: There are anomalous coronary arteries. No left
main or other artery is originating from the left sinus of Valsalva.

RCA is a very large tortuous vessel that gives rise to a large PDA
and a large PLA that also gives a branch supplying inferolateral
wall. There is only mild calcified plaque with associated stenosis
0-25%.

There is a small LAD originating from the proximal portion of RCA
with interarterial course (between aorta and pulmonary artery and
extending into mid to distal portion of the interventricular
sulcus).

There is another small branch originating from the proximal RCA
(distally to LAD) with anterior course (anteriorly to the pulmonary
artery) and extending into proximal portions of the AV groove
(rudimentary LCX artery).

Optimum Fluoroscopic Angle for Delivery:  LAO 16 A TALL 0.
IMPRESSION: 1. Severely thickened and calcified trileaflet aortic valve with
severely restricted leaflet opening and annular measurements
suitable for delivery of 29 mm Arikat 3 valve. The area is
borderline between 26 and 29 mm valve, however diameter measurements
consistent with 29 mm valve.

2. Anomalous coronaries with a large dominant RCA and a small LAD
with interarterial course and rudimentary LCX artery with anterior
course. Only mild CAD is seen.

3. Optimum Fluoroscopic Angle for Delivery:  LAO 16 A TALL 0.

[employer]

EXAM:
OVER-READ INTERPRETATION  CT CHEST

The following report is an over-read performed by radiologist Dr.
over-read does not include interpretation of cardiac or coronary
anatomy or pathology. The coronary calcium score/coronary CTA
interpretation by the cardiologist is attached.
FINDINGS: Extracardiac findings will be dictated under report for
contemporaneously obtained CTA of the chest, abdomen and pelvis
05/22/2015.
IMPRESSION: See separate dictation for contemporaneously obtained CTA of the
chest, abdomen and pelvis 05/22/2015 for full description of
extracardiac findings.

## 2016-06-08 ENCOUNTER — Other Ambulatory Visit: Payer: Self-pay | Admitting: Physician Assistant

## 2016-06-10 DIAGNOSIS — J449 Chronic obstructive pulmonary disease, unspecified: Secondary | ICD-10-CM | POA: Diagnosis not present

## 2016-06-10 NOTE — Telephone Encounter (Signed)
Review for refill. 

## 2016-06-11 ENCOUNTER — Other Ambulatory Visit: Payer: Self-pay | Admitting: Physician Assistant

## 2016-06-11 NOTE — Telephone Encounter (Signed)
Review for refill. 

## 2016-06-11 NOTE — Telephone Encounter (Signed)
metoprolol tartrate (LOPRESSOR) 25 MG tablet  Medication  Date: 06/10/2016 Department: [facility] [place] Office Ordering/Authorizing: [name], MD  Order Providers   Prescribing Provider Encounter Provider  [name], MD Almyra Deforest, [place]  Medication Detail    Disp Refills Start End   metoprolol tartrate (LOPRESSOR) 25 MG tablet 90 tablet 3 06/10/2016    Sig - Route: TAKE 0.5 TABLETS (12.5 MG TOTAL) BY MOUTH 2 (TWO) TIMES DAILY. - Oral   E-Prescribing Status: Receipt confirmed by pharmacy (06/10/2016 1:15 PM EST)   Pharmacy   [id number] Lorina Rabon, [place]

## 2016-06-13 ENCOUNTER — Ambulatory Visit (INDEPENDENT_AMBULATORY_CARE_PROVIDER_SITE_OTHER): Payer: PPO | Admitting: Family Medicine

## 2016-06-13 ENCOUNTER — Encounter: Payer: Self-pay | Admitting: Family Medicine

## 2016-06-13 VITALS — BP 140/80 | HR 77 | Wt 274.0 lb

## 2016-06-13 DIAGNOSIS — Z23 Encounter for immunization: Secondary | ICD-10-CM | POA: Diagnosis not present

## 2016-06-13 DIAGNOSIS — R300 Dysuria: Secondary | ICD-10-CM

## 2016-06-13 DIAGNOSIS — N3090 Cystitis, unspecified without hematuria: Secondary | ICD-10-CM | POA: Diagnosis not present

## 2016-06-13 LAB — POC URINALSYSI DIPSTICK (AUTOMATED)
BILIRUBIN UA: NEGATIVE
GLUCOSE UA: NEGATIVE
KETONES UA: NEGATIVE
PH UA: 6
Spec Grav, UA: 1.02
Urobilinogen, UA: NEGATIVE

## 2016-06-13 MED ORDER — CIPROFLOXACIN HCL 250 MG PO TABS: 250.0000 mg | ORAL_TABLET | Freq: Two times a day (BID) | ORAL | 0 refills | Status: DC

## 2016-06-13 NOTE — Progress Notes (Signed)
Subjective:    Patient ID: [name] [name], male    DOB: 10/14/1933, 82 y.o.   MRN: [id number]  HPI This is an 82 yo male who presents today with 2-3 weeks of right sided low back pain and pain in lower abdomen. Felt like he was passing a kidney stone. Had difficulty urinating, flow would stop and start. Pain has decreased and urine flow has improved. Had nocturia but this has resolved to his baseline 1-2x/ night. No nausea or vomiting or fever. No hematuria. Good appetite, good fluid intake. Feels much better than he did a couple of weeks ago. Has had UTI in past, does not want cephalexin, never works for him, is allergic to sulfa and nitrofurantoin. States he has never been on flomax previously. Has had multiple stones and feels like he passed this one. No recent urology care.   He left his oxygen in the car today. No cough, no chest pain. Breathing same.   Past Medical History:  Diagnosis Date  . AAA (abdominal aortic aneurysm) (HCC)    3.8 cm CTA 05/2015  . Anxiety   . COPD (chronic obstructive pulmonary disease) (HCC)    on home O2 4LPM  . Diverticulosis   . Former smoker    quit 2001  . Hemorrhoids   . Kidney stones    passed spontaneously  . OSA on CPAP    last study- in the home possibility- 2015, pt. unsure   . Prediabetes    Hgb A1C 5.8 in Jan 2017  . PUD (peptic ulcer disease)   . Right knee DJD 10/31/2011   Severe  . S/P TAVR (transcatheter aortic valve replacement) 06/26/2015   26 mm Edwards Sapien 3 transcatheter heart valve placed via percutaneous right transfemoral approach  . Severe aortic stenosis    a. Echo 11/16:  Mod LVH, EF 55-60%, no RWMA, Gr 1 DD, severe AS (mean 51 mmHg; peak 86 mmHg), MAC, mild LAE  b. s/p TAVR on 06/26/2015 with 48mm Edward Sapien 3 THV  . Shortness of breath dyspnea    Past Surgical History:  Procedure Laterality Date  . CARDIAC CATHETERIZATION N/A 05/15/2015   Procedure: Right/Left Heart Cath and Coronary Angiography;  Surgeon: [name], MD;  Location: [facility];  Service: Cardiovascular;  Laterality: N/A;  . CHOLECYSTECTOMY    . EYE SURGERY     bilateral cataracts removed, /w IOL  . KNEE SURGERY Right 1985   arthrosopic -   . TEE WITHOUT CARDIOVERSION N/A 06/26/2015   Procedure: TRANSESOPHAGEAL ECHOCARDIOGRAM (TEE);  Surgeon: [name], MD;  Location: [facility];  Service: Open Heart Surgery;  Laterality: N/A;  . TONSILLECTOMY    . TRANSCATHETER AORTIC VALVE REPLACEMENT, TRANSFEMORAL N/A 06/26/2015   Procedure: TRANSCATHETER AORTIC VALVE REPLACEMENT, TRANSFEMORAL;  Surgeon: [name], MD;  Location: [facility];  Service: Open Heart Surgery;  Laterality: N/A;   Family History  Problem Relation Age of Onset  . Stroke Mother   . Heart attack Mother 16    deceased  . Prostate cancer Father 43  . Colon cancer Neg Hx    Social History  Substance Use Topics  . Smoking status: Former Smoker    Packs/day: 1.50    Years: 53.00    Types: Cigarettes    Quit date: 06/23/1999  . Smokeless tobacco: Former Systems developer    Types: Snuff, Chew  . Alcohol use No      Review of Systems Per HPI    Objective:  Physical Exam  Constitutional: He is oriented to person, place, and time. He appears well-developed and well-nourished. No distress.  obese  Eyes: Conjunctivae are normal.  Cardiovascular: Normal rate, regular rhythm and normal heart sounds.   Pulmonary/Chest: Effort normal. No respiratory distress. He has no wheezes. He has no rales.  Slightly decreased breath sounds at bases.   Abdominal: Soft. He exhibits no distension. There is no tenderness. There is no rebound, no guarding and no CVA tenderness.  Neurological: He is alert and oriented to person, place, and time.  Skin: Skin is warm and dry. He is not diaphoretic.  Psychiatric: He has a normal mood and affect. His behavior is normal. Judgment and thought content normal.  Vitals reviewed.     BP 140/80   Pulse 77   Wt 274 lb (124.3 kg)   SpO2 (!)  88%   BMI 39.31 kg/m  Wt Readings from Last 3 Encounters:  06/13/16 274 lb (124.3 kg)  04/11/16 271 lb 12.8 oz (123.3 kg)  02/04/16 271 lb 3.2 oz (123 kg)   Results for orders placed or performed in visit on 06/13/16  POCT Urinalysis Dipstick (Automated)  Result Value Ref Range   Color, UA LIGHT YELLOW    Clarity, UA CLOUDY    Glucose, UA NEG    Bilirubin, UA NEG    Ketones, UA NEG    Spec Grav, UA 1.020    Blood, UA -    pH, UA 6.0    Protein, UA -    Urobilinogen, UA negative    Nitrite, UA -    Leukocytes, UA small (1+) (A) Negative  .this     Assessment & Plan:  1. Dysuria - POCT Urinalysis Dipstick (Automated)  2. Cystitis - sounds like he passed a kidney stone, he has long standing history and symptoms similar, + leukocytes in urine today, no blood, will treat and check culture - I offered him imaging to check for additional stones or blockage and he declined, stating that he feel better and that stone has passed - encouraged him to RTC if he develops further pain, fever, difficulty with urine flow - discussed potential adverse affects from ciprofloxacin and to stop immediately if he develops any musculoskeletal symptoms and call the office - increase fluids - ciprofloxacin (CIPRO) 250 MG tablet; Take 1 tablet (250 mg total) by mouth 2 (two) times daily.  Dispense: 6 tablet; Refill: 0 - Urine culture  Clarene Reamer, FNP-BC  [facility]  06/14/2016 7:49 AM

## 2016-06-13 NOTE — Patient Instructions (Signed)
I have sent in an antibiotic to your pharmacy Drink enough liquid to make your urine light yellow Please let us know if you develop a fever, nausea/vomiting or have more pain

## 2016-06-15 LAB — URINE CULTURE

## 2016-06-18 ENCOUNTER — Other Ambulatory Visit: Payer: Self-pay | Admitting: Family Medicine

## 2016-06-18 DIAGNOSIS — N3090 Cystitis, unspecified without hematuria: Secondary | ICD-10-CM

## 2016-06-18 DIAGNOSIS — R300 Dysuria: Secondary | ICD-10-CM

## 2016-06-18 DIAGNOSIS — R350 Frequency of micturition: Secondary | ICD-10-CM

## 2016-06-18 MED ORDER — AMOXICILLIN-POT CLAVULANATE 875-125 MG PO TABS: 1.0000 | ORAL_TABLET | Freq: Two times a day (BID) | ORAL | 0 refills | Status: DC

## 2016-06-24 DIAGNOSIS — J449 Chronic obstructive pulmonary disease, unspecified: Secondary | ICD-10-CM | POA: Diagnosis not present

## 2016-06-25 DIAGNOSIS — J449 Chronic obstructive pulmonary disease, unspecified: Secondary | ICD-10-CM | POA: Diagnosis not present

## 2016-06-25 DIAGNOSIS — G4733 Obstructive sleep apnea (adult) (pediatric): Secondary | ICD-10-CM | POA: Diagnosis not present

## 2016-06-30 IMAGING — CR DG CHEST 1V PORT
1 series · 1 of 1 positions shown · non-contrast
Comparison: 06/22/2015

CLINICAL DATA: Aortic stenosis, post aortic valve repair

EXAM:
PORTABLE CHEST 1 VIEW

[portable]
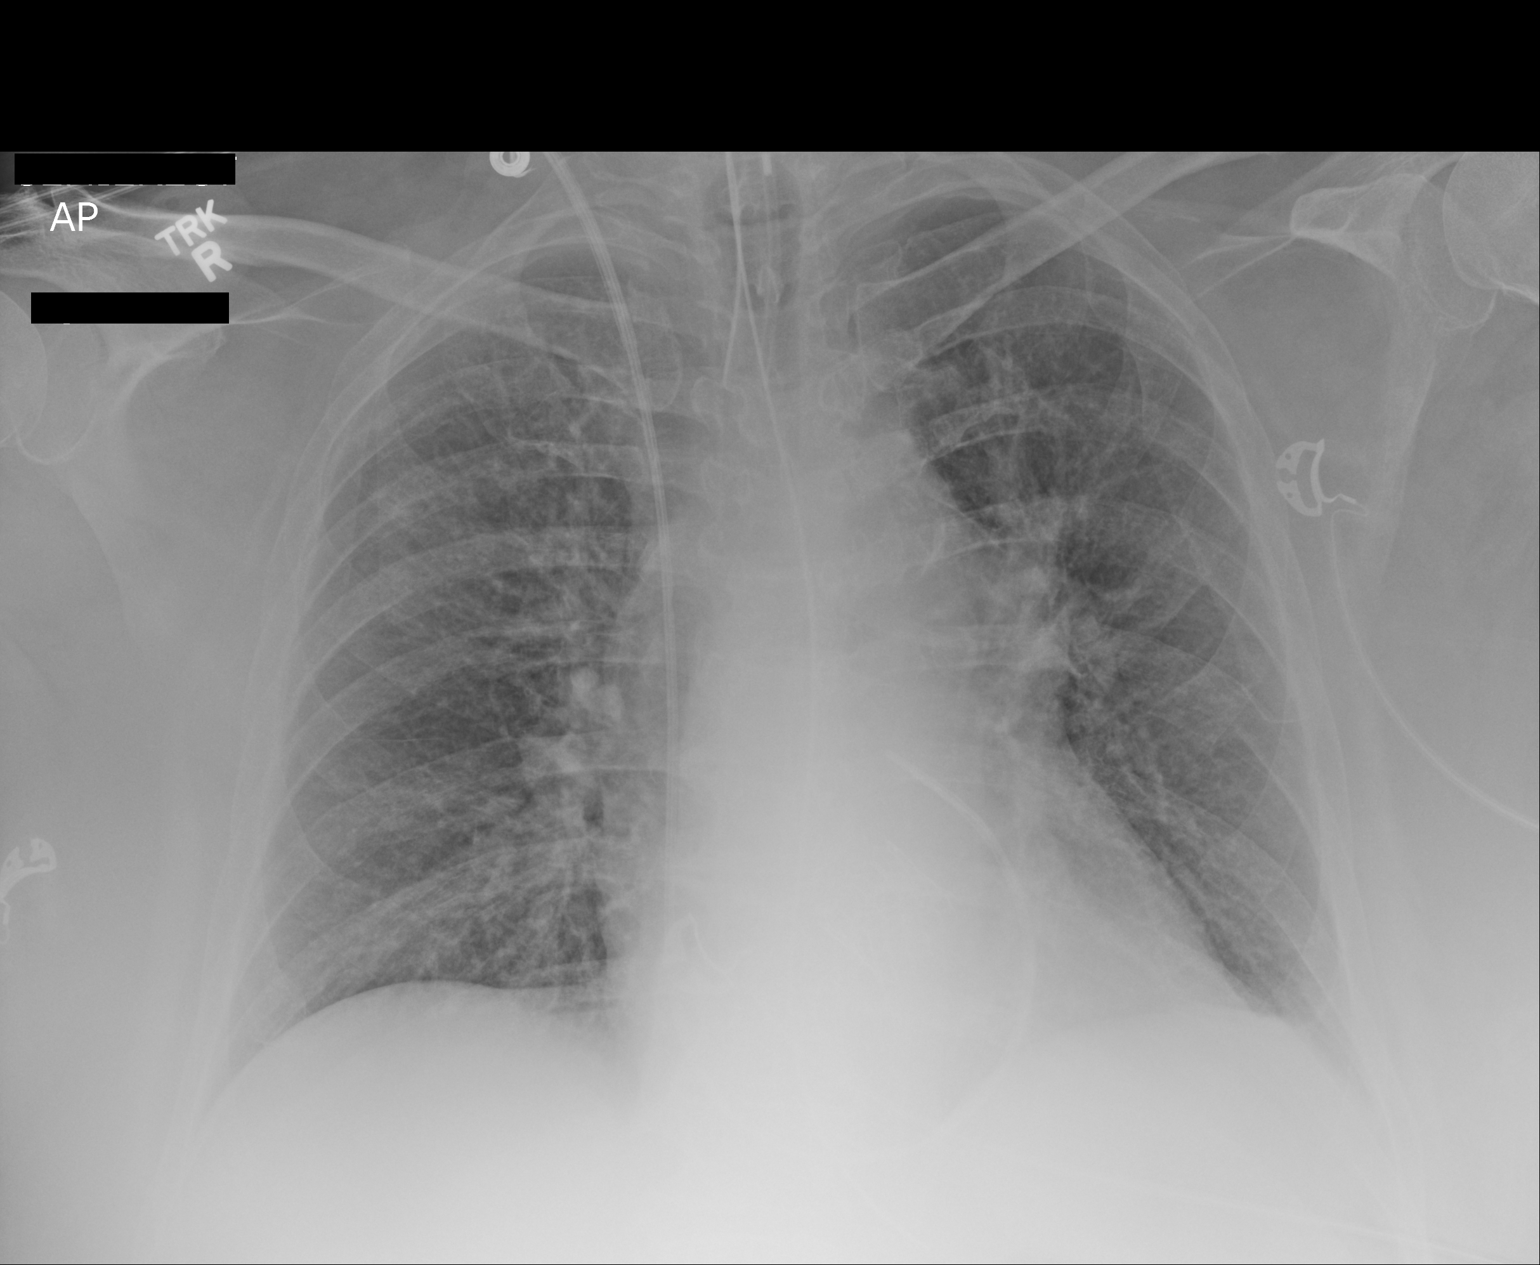

[1 of 1 positions shown; findings below may reference images not displayed]

FINDINGS: Right Swan-Ganz catheter tip is in the main pulmonary artery.
Endotracheal tube is 5 cm above the carina. NG tube enters the
stomach.

Changes of aortic valve repair. Mild interstitial prominence could
reflect interstitial edema. No confluent opacities or pneumothorax.
IMPRESSION: Interstitial prominence, question interstitial edema. No
pneumothorax.

Support devices in expected position.

## 2016-07-01 IMAGING — CR DG CHEST 1V PORT
1 series · 1 of 1 positions shown · non-contrast
Comparison: Portable chest x-ray June 26, 2015

CLINICAL DATA: Transcatheter aortic valve replacement for severe
aortic stenosis, morbid obesity, Celsius OPD.

EXAM:
PORTABLE CHEST 1 VIEW

[AP]
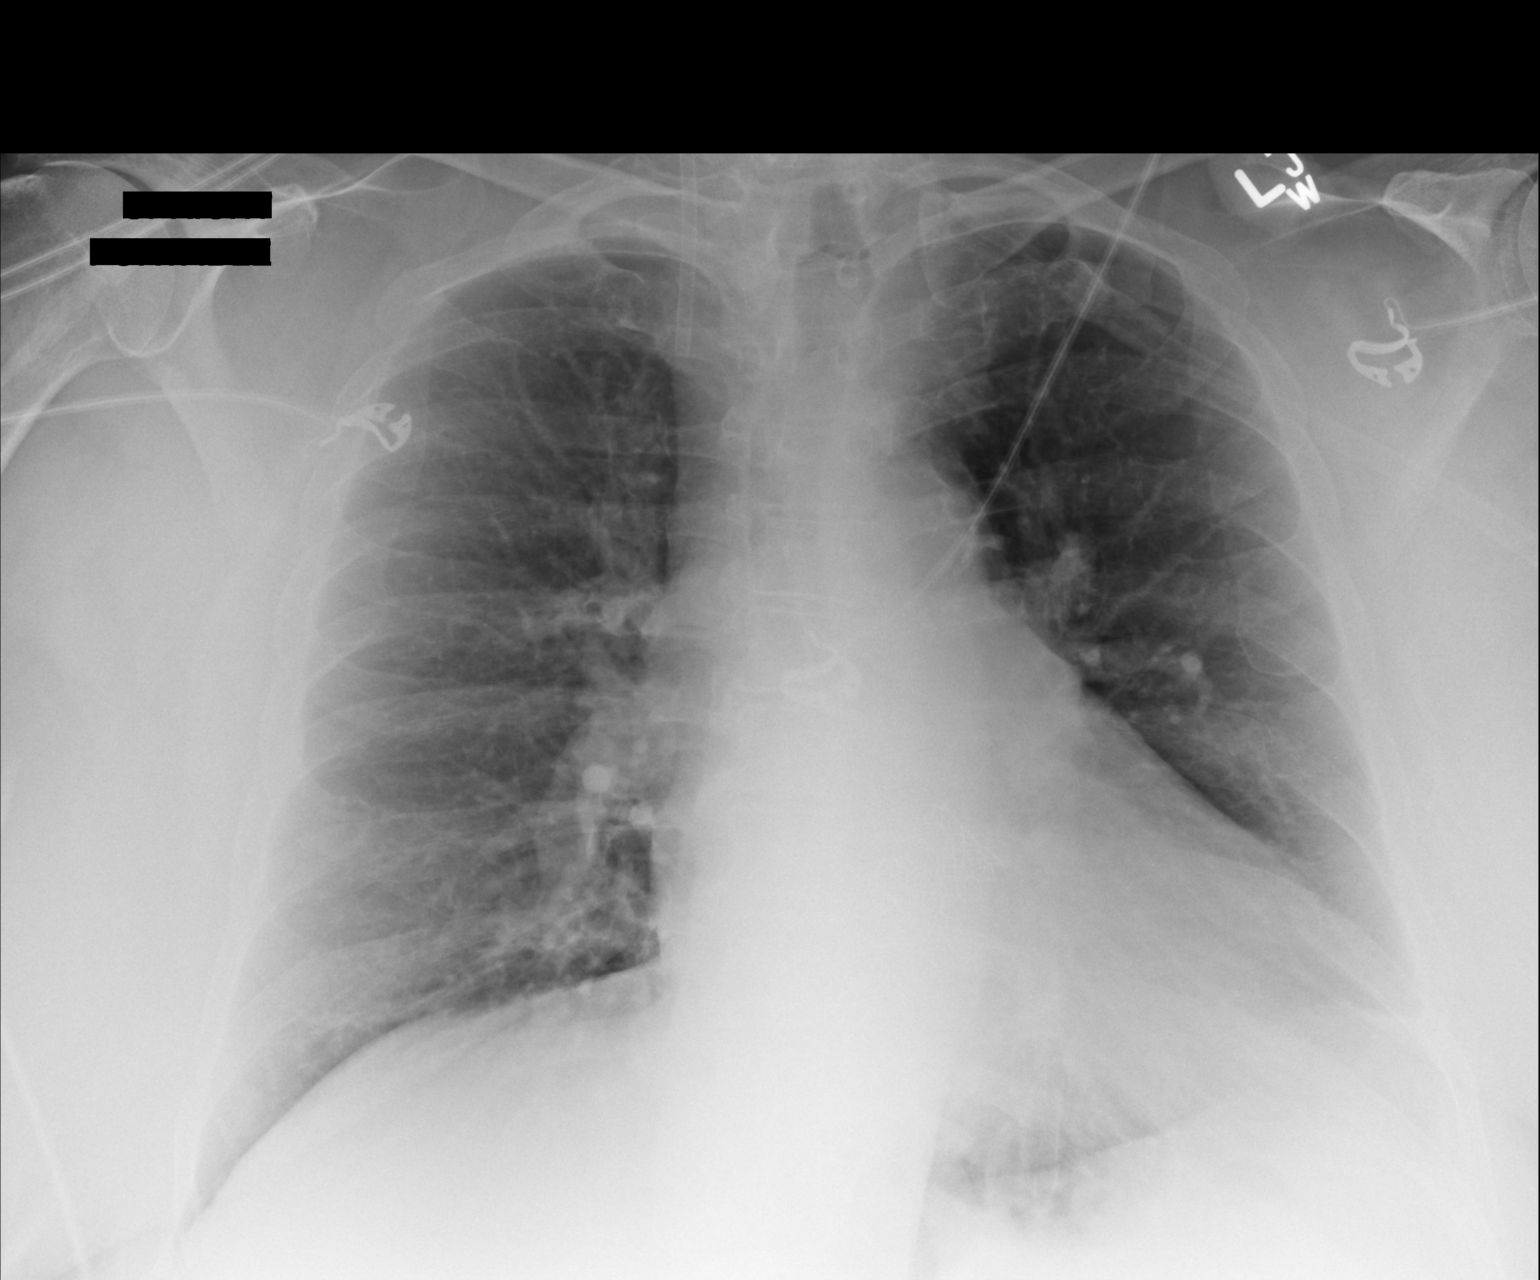

[1 of 1 positions shown; findings below may reference images not displayed]

FINDINGS: There has been interval extubation of the trachea and of the
esophagus. The lungs remain well-expanded. There is no focal
infiltrate. The pulmonary interstitial markings have normalized. The
cardiac silhouette is top-normal in size. The pulmonary vascularity
remains mildly prominent but pulmonary interstitial edema has
markedly improved. The right internal jugular Cordis sheath tip
projects over the proximal SVC.
IMPRESSION: Marked interval improvement in the pulmonary interstitium consistent
with decreased pulmonary interstitial edema. Stable mild
cardiomegaly. No pneumonia or pleural effusion. When the patient can
tolerate the procedure, a PA and lateral chest x-ray would be
useful.

## 2016-07-03 ENCOUNTER — Other Ambulatory Visit: Payer: PPO

## 2016-07-11 ENCOUNTER — Telehealth: Payer: Self-pay | Admitting: Family Medicine

## 2016-07-11 DIAGNOSIS — J449 Chronic obstructive pulmonary disease, unspecified: Secondary | ICD-10-CM | POA: Diagnosis not present

## 2016-07-11 NOTE — Telephone Encounter (Signed)
Pt got a robot call about flu shot. Pt states that he got his flu shot on his 12/29 appt.

## 2016-07-11 NOTE — Telephone Encounter (Signed)
I do see that he got it that day based on the sheet that was scanned in. I noted it in the immunizations and the Health Maintenance.  Araceli, will you go back in to his appointment that day and build the shot? Thanks

## 2016-07-13 DIAGNOSIS — I5033 Acute on chronic diastolic (congestive) heart failure: Secondary | ICD-10-CM | POA: Insufficient documentation

## 2016-07-13 DIAGNOSIS — I714 Abdominal aortic aneurysm, without rupture, unspecified: Secondary | ICD-10-CM | POA: Insufficient documentation

## 2016-07-13 DIAGNOSIS — I5032 Chronic diastolic (congestive) heart failure: Secondary | ICD-10-CM | POA: Insufficient documentation

## 2016-07-13 NOTE — Progress Notes (Signed)
Cardiology Office Note:    Date:  07/14/2016   ID:  [name] [name], DOB 05/15/1934, MRN [id number]  PCP:  [name], MD  Cardiologist:  Dr. [name]   Electrophysiologist:  n/a Pulmonologist: Dr. Nadel/Dr. Halford Chessman  Referring MD: Tonia Ghent, MD   Chief Complaint  Patient presents with  . Follow-up    history of TAVR    History of Present Illness:    [name] [name] is a 83 y.o. male with a hx of aortic stenosis, diastolic CHF, chronic respiratory failure on home O2, COPD, OSA on CPAP.  He underwent TAVR in 1/17.  He had mildly elevated transvalvular gradients with trivial AI post operatively.  Last seen by Dr. [name] in 10/17.  He returns for Cardiology follow up.    He is here with his wife.  His dyspnea is overall stable.  His wife notes he remains improved since his TAVR.  He denies orthopnea, PND, edema.  He denies any syncope.  He has occ chest pain that is atypical.   He has a cough that is fairly chronic.   Prior CV studies that were reviewed today include:    Echo 2/17 Mild LVH, EF 55-60, Gr 1 DD, TAVR not well seen, peak and mean gradients 42 and 23 respectively  Echo 1/17 Mild LVH, EF 60-65, no RWMA, TAVR with trial AI and mean 14/peak 29, MAC, mild LAE, PASP 35   LHC 11/16 1. Large, dominant RCA, possibly with a single coronary artery supplying an anomalous left circumflex and supplying distal circulation to the LAD territory 2. No evidence of obstructive coronary artery disease 3. 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 Severely calcified aortic valve with restricted mobility, hemodynamics demonstrating a mean transaortic gradient of 44 mmHg and calculated aortic valve area of 1 cm. 5. Mild pulmonary HTN and elevated right heart pressures, likely related to LV diastolic dysfunction  Echo 04/18/15 Mod LVH, EF 55-60%, no RWMA, Gr 1 DD, severe AS (mean 51 mmHg; peak 86 mmHg), MAC, mild LAE  Past Medical History:  Diagnosis Date  . AAA  (abdominal aortic aneurysm) (HCC)    3.8 cm CTA 05/2015  . Anxiety   . COPD (chronic obstructive pulmonary disease) (HCC)    on home O2 4LPM  . Diverticulosis   . Former smoker    quit 2001  . Hemorrhoids   . Kidney stones    passed spontaneously  . OSA on CPAP    last study- in the home possibility- 2015, pt. unsure   . Prediabetes    Hgb A1C 5.8 in Jan 2017  . PUD (peptic ulcer disease)   . Right knee DJD 10/31/2011   Severe  . S/P TAVR (transcatheter aortic valve replacement) 06/26/2015   26 mm Edwards Sapien 3 transcatheter heart valve placed via percutaneous right transfemoral approach  . Severe aortic stenosis    a. Echo 11/16:  Mod LVH, EF 55-60%, no RWMA, Gr 1 DD, severe AS (mean 51 mmHg; peak 86 mmHg), MAC, mild LAE  b. s/p TAVR on 06/26/2015 with 21m Edward Sapien 3 THV  . Shortness of breath dyspnea     Past Surgical History:  Procedure Laterality Date  . CARDIAC CATHETERIZATION N/A 05/15/2015   Procedure: Right/Left Heart Cath and Coronary Angiography;  Surgeon: MSherren Mocha MD;  [facility];  Service: Cardiovascular;  Laterality: N/A;  . CHOLECYSTECTOMY    . EYE SURGERY     bilateral cataracts removed, /w IOL  .  KNEE SURGERY Right 1985   arthrosopic -   . TEE WITHOUT CARDIOVERSION N/A 06/26/2015   Procedure: TRANSESOPHAGEAL ECHOCARDIOGRAM (TEE);  Surgeon: [name], MD;  Location: [facility];  Service: Open Heart Surgery;  Laterality: N/A;  . TONSILLECTOMY    . TRANSCATHETER AORTIC VALVE REPLACEMENT, TRANSFEMORAL N/A 06/26/2015   Procedure: TRANSCATHETER AORTIC VALVE REPLACEMENT, TRANSFEMORAL;  Surgeon: [name], MD;  Location: [facility];  Service: Open Heart Surgery;  Laterality: N/A;    Current Medications: Current Meds  Medication Sig  . aspirin EC 81 MG tablet Take 1 tablet (81 mg total) by mouth daily.  . furosemide (LASIX) 20 MG tablet Take 1 tablet (20 mg total) by mouth daily.  Marland Kitchen guaiFENesin (MUCINEX) 600 MG 12 hr tablet Take 600 mg  by mouth 2 (two) times daily.   Marland Kitchen ibuprofen (ADVIL,MOTRIN) 200 MG tablet Take 200 mg by mouth every 6 (six) hours as needed (pain). Reported on 07/30/2015  . ipratropium-albuterol (DUONEB) 0.5-2.5 (3) MG/3ML SOLN Take 3 mLs by nebulization 3 (three) times daily.  Marland Kitchen LORazepam (ATIVAN) 1 MG tablet Take 0.25-0.5 tablets by mouth every 6 (six) hours as needed for anxiety.  . metoprolol tartrate (LOPRESSOR) 25 MG tablet TAKE 0.5 TABLETS (12.5 MG TOTAL) BY MOUTH 2 (TWO) TIMES DAILY.  Marland Kitchen OXYGEN Inhale into the lungs. 4 Liters  . polyethylene glycol (MIRALAX / GLYCOLAX) packet Take 17 g by mouth daily as needed (constiptation).  Marland Kitchen PROAIR HFA 108 (90 BASE) MCG/ACT inhaler INHALE 1-2 PUFFS INTO THE LUNGS EVERY 6 (SIX) HOURS AS NEEDED FOR WHEEZING.  Marland Kitchen Respiratory Therapy Supplies (FLUTTER) DEVI Use as directed  . sodium chloride (OCEAN) 0.65 % SOLN nasal spray Place 2 sprays into both nostrils daily as needed for congestion.      Allergies:   Iohexol; Atorvastatin; Celecoxib; Clinoril [sulindac]; Nitrofurantoin; and Sulfa antibiotics   Social History   Social History  . Marital status: Married    Spouse name: N/A  . Number of children: 1  . Years of education: N/A   Occupational History  . 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 Smoking status: Former Smoker    Packs/day: 1.50    Years: 53.00    Types: Cigarettes    Quit date: 06/23/1999  . Smokeless tobacco: Former Systems developer    Types: Snuff, Chew  . Alcohol use No  . Drug use: No  . Sexual activity: No   Other Topics Concern  . None   Social History Narrative   Retired, Hotel manager   Married 1958   1 daughter   Former smoker     Family History:  The patient's family history includes Heart attack (age of onset: 86) in his mother; Prostate cancer (age of onset: 50) in his father; Stroke in his mother.   ROS:   Please see the history of present illness.    ROS All other systems  reviewed and are negative.   EKGs/Labs/Other Test Reviewed:    EKG:  EKG is  ordered today.  The ekg ordered today demonstrates NSR ,HR 80, normal axis, NSSTTW changes, PAC, QTc 424 ms, no change from prior tracings  Recent Labs: 08/09/2015: BUN 12; Creatinine, Ser 0.81; Potassium 4.6; Sodium 141   Recent Lipid Panel    Component Value Date/Time   CHOL 173 08/09/2015 0859   TRIG 90.0 08/09/2015 0859   HDL 48.50 08/09/2015 0859   CHOLHDL 4 08/09/2015  0859   VLDL 18.0 08/09/2015 0859   LDLCALC 107 (H) 08/09/2015 0859    Physical Exam:    VS:  BP (!) 164/76   Pulse 80   Ht _0  (1.778 m)   Wt 278 lb 6.4 oz (126.3 kg)   SpO2 (!) 88%   BMI 39.95 kg/m     Wt Readings from Last 3 Encounters:  07/14/16 278 lb 6.4 oz (126.3 kg)  06/13/16 274 lb (124.3 kg)  04/11/16 271 lb 12.8 oz (123.3 kg)     Physical Exam  Constitutional: He is oriented to person, place, and time. He appears well-developed and well-nourished. No distress.  HENT:  Head: Normocephalic and atraumatic.  Eyes: No scleral icterus.  Neck:  No JVD at 90 degrees - I cannot appreciate JVD  Cardiovascular: Normal rate, regular rhythm, S1 normal and S2 normal.   Murmur heard.  Systolic murmur is present with a grade of 2/6  at the upper right sternal border Pulmonary/Chest: He has decreased breath sounds. He has no wheezes. He has no rhonchi. He has no rales.  Abdominal: There is no tenderness.  Musculoskeletal: He exhibits edema.  Tight bilateral LE edema  Neurological: He is alert and oriented to person, place, and time.  Skin: Skin is warm and dry.  Psychiatric: He has a normal mood and affect.    ASSESSMENT:    1. S/P TAVR (transcatheter aortic valve replacement)   2. Chronic diastolic CHF (congestive heart failure) ([place])   3. Other emphysema ([place])   4. AAA (abdominal aortic aneurysm) without rupture (HCC)    PLAN:    In order of problems listed above:  1. S/p TAVR - Last echo in 2/17 showed an  increased in transvalvular gradients without significant AI.  He did undergo repeat echo today.  The official report is not back yet.  But, Dr. Burt Knack reviewed it and his gradients appear stable.  Continue ASA.  Continue SBE prophylaxis.    2. Chronic diastolic CHF - His legs are s/w swollen and his weight is up. BP is higher than usual.  His volume is difficult to assess.  But, he may be s/w volume overloaded.  Increase Lasix to 40 mg QD x 3 days, then resume 20 mg QD.  Continue to monitor BP.  3. COPD - FU with Pulmonology as planned.   4. AAA - 3.8 cm at staging CT prior to TAVR.  FU ultrasound recommended in 05/2017.    Medication Adjustments/Labs and Tests Ordered: Current medicines are reviewed at length with the patient today.  Concerns regarding medicines are outlined above.  Medication changes, Labs and Tests ordered today are outlined in the Patient Instructions noted below. Patient Instructions  Medication Instructions:  1. INCREASE LAS TO 40 MG DAILY FOR 3 DAYS THEN GO BACK TO LASIX 20 MG DAILY  Labwork: NONE  Testing/Procedures: NONE  Follow-Up: Your physician wants you to follow-up in: DR. Burt Knack IN 6 MONTHS. You will receive a reminder letter in the mail two months in advance. If you don't receive a letter, please call our office to schedule the follow-up appointment.  Any Other Special Instructions Will Be Listed Below (If Applicable).  If you need a refill on your cardiac medications before your next appointment, please call your pharmacy.  Signed, Richardson Dopp, PA-C  07/14/2016 10:01 AM    [facility] [address], [place], [place]  [address] Phone: [phone]; Fax: [phone]

## 2016-07-14 ENCOUNTER — Ambulatory Visit (HOSPITAL_COMMUNITY): Payer: PPO | Attending: Cardiovascular Disease

## 2016-07-14 ENCOUNTER — Telehealth: Payer: Self-pay | Admitting: Physician Assistant

## 2016-07-14 ENCOUNTER — Ambulatory Visit (INDEPENDENT_AMBULATORY_CARE_PROVIDER_SITE_OTHER): Payer: PPO | Admitting: Physician Assistant

## 2016-07-14 ENCOUNTER — Encounter: Payer: Self-pay | Admitting: Physician Assistant

## 2016-07-14 ENCOUNTER — Encounter (HOSPITAL_COMMUNITY): Payer: Self-pay | Admitting: *Deleted

## 2016-07-14 ENCOUNTER — Other Ambulatory Visit: Payer: Self-pay

## 2016-07-14 VITALS — BP 164/76 | HR 80 | Ht 70.0 in | Wt 278.4 lb

## 2016-07-14 DIAGNOSIS — I714 Abdominal aortic aneurysm, without rupture, unspecified: Secondary | ICD-10-CM

## 2016-07-14 DIAGNOSIS — Z952 Presence of prosthetic heart valve: Secondary | ICD-10-CM | POA: Diagnosis not present

## 2016-07-14 DIAGNOSIS — Z8249 Family history of ischemic heart disease and other diseases of the circulatory system: Secondary | ICD-10-CM | POA: Diagnosis not present

## 2016-07-14 DIAGNOSIS — J438 Other emphysema: Secondary | ICD-10-CM | POA: Diagnosis not present

## 2016-07-14 DIAGNOSIS — Z87891 Personal history of nicotine dependence: Secondary | ICD-10-CM | POA: Insufficient documentation

## 2016-07-14 DIAGNOSIS — I5032 Chronic diastolic (congestive) heart failure: Secondary | ICD-10-CM | POA: Diagnosis not present

## 2016-07-14 DIAGNOSIS — I5189 Other ill-defined heart diseases: Secondary | ICD-10-CM | POA: Insufficient documentation

## 2016-07-14 DIAGNOSIS — Z953 Presence of xenogenic heart valve: Secondary | ICD-10-CM | POA: Insufficient documentation

## 2016-07-14 DIAGNOSIS — G4733 Obstructive sleep apnea (adult) (pediatric): Secondary | ICD-10-CM | POA: Diagnosis not present

## 2016-07-14 DIAGNOSIS — I517 Cardiomegaly: Secondary | ICD-10-CM | POA: Diagnosis not present

## 2016-07-14 DIAGNOSIS — I340 Nonrheumatic mitral (valve) insufficiency: Secondary | ICD-10-CM | POA: Diagnosis not present

## 2016-07-14 NOTE — Progress Notes (Unsigned)
Patient Arrived 15 minutes late for appointment, and was checked in 20 minutes into appointment time.  Definity contrast is not able to be administered due to time constraints.  Schedule as a limited follow up with contrast if indicated and advise patient to be on time.     Deliah Boston, RDCS

## 2016-07-14 NOTE — Telephone Encounter (Signed)
Follow Up:; ° ° °Returning your call. °

## 2016-07-14 NOTE — Telephone Encounter (Signed)
I had called pt earlier to tell him I forgot to have him answer questions about his HF for Dr. Burt Knack. Ptcb and kindly allowed me to ask him the questions over the phone. Paper work has now been given Theodosia Quay, RN for Dr. Burt Knack. I apologized and thanked pt again for his help. Pt said no problem.

## 2016-07-14 NOTE — Patient Instructions (Addendum)
Medication Instructions:  1. INCREASE LAS TO 40 MG DAILY FOR 3 DAYS THEN GO BACK TO LASIX 20 MG DAILY  Labwork: NONE  Testing/Procedures: NONE  Follow-Up: Your physician wants you to follow-up in: DR. Burt Knack IN 6 MONTHS. You will receive a reminder letter in the mail two months in advance. If you don't receive a letter, please call our office to schedule the follow-up appointment.  Any Other Special Instructions Will Be Listed Below (If Applicable).  If you need a refill on your cardiac medications before your next appointment, please call your pharmacy.

## 2016-07-15 DIAGNOSIS — Z23 Encounter for immunization: Secondary | ICD-10-CM | POA: Diagnosis not present

## 2016-07-15 DIAGNOSIS — R300 Dysuria: Secondary | ICD-10-CM | POA: Diagnosis not present

## 2016-07-15 DIAGNOSIS — N3090 Cystitis, unspecified without hematuria: Secondary | ICD-10-CM | POA: Diagnosis not present

## 2016-07-15 NOTE — Telephone Encounter (Signed)
Done

## 2016-07-25 DIAGNOSIS — J449 Chronic obstructive pulmonary disease, unspecified: Secondary | ICD-10-CM | POA: Diagnosis not present

## 2016-07-26 DIAGNOSIS — J449 Chronic obstructive pulmonary disease, unspecified: Secondary | ICD-10-CM | POA: Diagnosis not present

## 2016-07-26 DIAGNOSIS — G4733 Obstructive sleep apnea (adult) (pediatric): Secondary | ICD-10-CM | POA: Diagnosis not present

## 2016-08-04 ENCOUNTER — Telehealth: Payer: Self-pay | Admitting: Pulmonary Disease

## 2016-08-04 NOTE — Telephone Encounter (Signed)
Attempted to contact pt. No answer, no option to leave a message. Will try back.  

## 2016-08-05 NOTE — Telephone Encounter (Signed)
Called and spoke with pts wife and she stated that the pt went in the donut hole and had to be changed to the nebulizer.  With the new year, he would like to change back to proair, symbicort and spiriva.  SN please advise. Thanks  Allergies  Allergen Reactions  . Iohexol Hives and Swelling    Pt reports swelling, redness, hives, and blisters   . Atorvastatin     REACTION: aches  . Celecoxib     REACTION: rash  . Clinoril [Sulindac]     REACTION: rash  . Nitrofurantoin   . Sulfa Antibiotics     Intolerant but unrecalled.

## 2016-08-06 NOTE — Telephone Encounter (Signed)
Spoke with pt. He is aware that we are still waiting to here back from SN about this message. He verbalized understanding.  SN - please advise. Thanks.

## 2016-08-06 NOTE — Telephone Encounter (Signed)
Pt returning call.[name] [name] ° °

## 2016-08-08 MED ORDER — TIOTROPIUM BROMIDE MONOHYDRATE 18 MCG IN CAPS
18.0000 ug | ORAL_CAPSULE | Freq: Every day | RESPIRATORY_TRACT | 11 refills | Status: DC
Start: 2016-08-08 — End: 2016-11-20

## 2016-08-08 MED ORDER — BUDESONIDE-FORMOTEROL FUMARATE 160-4.5 MCG/ACT IN AERO: 2.0000 | INHALATION_SPRAY | Freq: Two times a day (BID) | RESPIRATORY_TRACT | 11 refills | Status: DC

## 2016-08-08 MED ORDER — ALBUTEROL SULFATE HFA 108 (90 BASE) MCG/ACT IN AERS: INHALATION_SPRAY | RESPIRATORY_TRACT | 11 refills | Status: DC

## 2016-08-08 NOTE — Telephone Encounter (Signed)
Per SN--  Ok to start   symbicort 160  2 sprays bid spiriva 1 capsule via handihaler daily proair hfa  1-2 puffs every 6 hours prn   These have been sent to the pharmacy and pt is aware.

## 2016-08-11 DIAGNOSIS — J449 Chronic obstructive pulmonary disease, unspecified: Secondary | ICD-10-CM | POA: Diagnosis not present

## 2016-08-12 DIAGNOSIS — G4733 Obstructive sleep apnea (adult) (pediatric): Secondary | ICD-10-CM | POA: Diagnosis not present

## 2016-08-22 DIAGNOSIS — J449 Chronic obstructive pulmonary disease, unspecified: Secondary | ICD-10-CM | POA: Diagnosis not present

## 2016-08-23 DIAGNOSIS — J449 Chronic obstructive pulmonary disease, unspecified: Secondary | ICD-10-CM | POA: Diagnosis not present

## 2016-08-23 DIAGNOSIS — G4733 Obstructive sleep apnea (adult) (pediatric): Secondary | ICD-10-CM | POA: Diagnosis not present

## 2016-09-08 DIAGNOSIS — J449 Chronic obstructive pulmonary disease, unspecified: Secondary | ICD-10-CM | POA: Diagnosis not present

## 2016-09-15 ENCOUNTER — Other Ambulatory Visit: Payer: PPO

## 2016-09-15 ENCOUNTER — Ambulatory Visit: Payer: PPO

## 2016-09-19 ENCOUNTER — Encounter: Payer: PPO | Admitting: Family Medicine

## 2016-09-22 DIAGNOSIS — J449 Chronic obstructive pulmonary disease, unspecified: Secondary | ICD-10-CM | POA: Diagnosis not present

## 2016-10-03 ENCOUNTER — Ambulatory Visit: Payer: PPO

## 2016-10-09 DIAGNOSIS — J449 Chronic obstructive pulmonary disease, unspecified: Secondary | ICD-10-CM | POA: Diagnosis not present

## 2016-10-22 DIAGNOSIS — J449 Chronic obstructive pulmonary disease, unspecified: Secondary | ICD-10-CM | POA: Diagnosis not present

## 2016-10-23 ENCOUNTER — Telehealth: Payer: Self-pay | Admitting: Family Medicine

## 2016-10-23 NOTE — Telephone Encounter (Signed)
Patient dropped off jury duty form for provider review. Placed in pres tower.   Thank you,  -LL

## 2016-10-23 NOTE — Telephone Encounter (Signed)
Placed in Dr. Duncan's In Box. 

## 2016-10-24 NOTE — Telephone Encounter (Signed)
I'll work on the hard copy.  Thanks.  

## 2016-10-26 NOTE — Telephone Encounter (Signed)
Letter done.  Thanks. Please make sure it printed.  

## 2016-10-27 NOTE — Telephone Encounter (Signed)
Patient advised.  Placed at front desk for pickup.

## 2016-11-08 DIAGNOSIS — J449 Chronic obstructive pulmonary disease, unspecified: Secondary | ICD-10-CM | POA: Diagnosis not present

## 2016-11-11 ENCOUNTER — Other Ambulatory Visit: Payer: Self-pay | Admitting: Family Medicine

## 2016-11-11 DIAGNOSIS — E559 Vitamin D deficiency, unspecified: Secondary | ICD-10-CM

## 2016-11-11 DIAGNOSIS — R7309 Other abnormal glucose: Secondary | ICD-10-CM

## 2016-11-12 ENCOUNTER — Ambulatory Visit: Payer: PPO

## 2016-11-14 ENCOUNTER — Ambulatory Visit (INDEPENDENT_AMBULATORY_CARE_PROVIDER_SITE_OTHER): Payer: PPO

## 2016-11-14 VITALS — BP 142/88 | HR 74 | Temp 98.9°F | Ht 69.0 in | Wt 271.5 lb

## 2016-11-14 DIAGNOSIS — E559 Vitamin D deficiency, unspecified: Secondary | ICD-10-CM

## 2016-11-14 DIAGNOSIS — Z0000 Encounter for general adult medical examination without abnormal findings: Secondary | ICD-10-CM

## 2016-11-14 DIAGNOSIS — R7309 Other abnormal glucose: Secondary | ICD-10-CM

## 2016-11-14 LAB — LIPID PANEL
CHOL/HDL RATIO: 3
CHOLESTEROL: 184 mg/dL (ref 0–200)
HDL: 57.7 mg/dL (ref >39.00)
LDL CALC: 111 mg/dL — AB (ref 0–99)
NonHDL: 126.05
Triglycerides: 76 mg/dL (ref 0.0–149.0)
VLDL: 15.2 mg/dL (ref 0.0–40.0)

## 2016-11-14 LAB — BASIC METABOLIC PANEL
BUN: 18 mg/dL (ref 6–23)
CO2: 36 mEq/L — ABNORMAL HIGH (ref 19–32)
Calcium: 9.3 mg/dL (ref 8.4–10.5)
Chloride: 100 mEq/L (ref 96–112)
Creatinine, Ser: 0.9 mg/dL (ref 0.40–1.50)
GFR: 86.04 mL/min (ref >60.00)
Glucose, Bld: 100 mg/dL — ABNORMAL HIGH (ref 70–99)
POTASSIUM: 4.3 meq/L (ref 3.5–5.1)
SODIUM: 142 meq/L (ref 135–145)

## 2016-11-14 LAB — VITAMIN D 25 HYDROXY (VIT D DEFICIENCY, FRACTURES): VITD: 9.77 ng/mL — AB (ref 30.00–100.00)

## 2016-11-14 LAB — HEMOGLOBIN A1C: HEMOGLOBIN A1C: 5.5 % (ref 4.6–6.5)

## 2016-11-14 NOTE — Progress Notes (Signed)
Pre visit review using our clinic review tool, if applicable. No additional management support is needed unless otherwise documented below in the visit note. 

## 2016-11-14 NOTE — Progress Notes (Signed)
PCP notes:   Health maintenance:  Tetanus - postponed/insurance  Abnormal screenings:   Fall risk - hx of fall with no injury or medical treatment Depression score: 7 Hearing - failed Mini-Cog score: 19/20  Patient concerns:   None  Nurse concerns:  None  Next PCP appt:   11/20/16 @ 0945

## 2016-11-14 NOTE — Patient Instructions (Signed)
Mr. Baird , Thank you for taking time to come for your Medicare Wellness Visit. I appreciate your ongoing commitment to your health goals. Please review the following plan we discussed and let me know if I can assist you in the future.   These are the goals we discussed: Goals    . Increase physical activity          Starting 11/14/16, I will continue to do arm and leg exercises for 30 min 4-5 days per week.        This is a list of the screening recommended for you and due dates:  Health Maintenance  Topic Date Due  . Colon Cancer Screening  11/14/2025*  . Tetanus Vaccine  02/04/2026*  . Flu Shot  01/14/2017  . Pneumonia vaccines  Completed  *Topic was postponed. The date shown is not the original due date.   Preventive Care for Adults  A healthy lifestyle and preventive care can promote health and wellness. Preventive health guidelines for adults include the following key practices.  . A routine yearly physical is a good way to check with your health care provider about your health and preventive screening. It is a chance to share any concerns and updates on your health and to receive a thorough exam.  . Visit your dentist for a routine exam and preventive care every 6 months. Brush your teeth twice a day and floss once a day. Good oral hygiene prevents tooth decay and gum disease.  . The frequency of eye exams is based on your age, health, family medical history, use  of contact lenses, and other factors. Follow your health care provider's ecommendations for frequency of eye exams.  . Eat a healthy diet. Foods like vegetables, fruits, whole grains, low-fat dairy products, and lean protein foods contain the nutrients you need without too many calories. Decrease your intake of foods high in solid fats, added sugars, and salt. Eat the right amount of calories for you. Get information about a proper diet from your health care provider, if necessary.  . Regular physical exercise is one  of the most important things you can do for your health. Most adults should get at least 150 minutes of moderate-intensity exercise (any activity that increases your heart rate and causes you to sweat) each week. In addition, most adults need muscle-strengthening exercises on 2 or more days a week.  Silver Sneakers may be a benefit available to you. To determine eligibility, you may visit the website: www.silversneakers.com or contact program at [phone] Mon-Fri between 8AM-8PM.   . Maintain a healthy weight. The body mass index (BMI) is a screening tool to identify possible weight problems. It provides an estimate of body fat based on height and weight. Your health care provider can find your BMI and can help you achieve or maintain a healthy weight.   For adults 20 years and older: ? A BMI below 18.5 is considered underweight. ? A BMI of 18.5 to 24.9 is normal. ? A BMI of 25 to 29.9 is considered overweight. ? A BMI of 30 and above is considered obese.   . Maintain normal blood lipids and cholesterol levels by exercising and minimizing your intake of saturated fat. Eat a balanced diet with plenty of fruit and vegetables. Blood tests for lipids and cholesterol should begin at age 52 and be repeated every 5 years. If your lipid or cholesterol levels are high, you are over 50, or you are at high risk for heart  disease, you may need your cholesterol levels checked more frequently. Ongoing high lipid and cholesterol levels should be treated with medicines if diet and exercise are not working.  . If you smoke, find out from your health care provider how to quit. If you do not use tobacco, please do not start.  . If you choose to drink alcohol, please do not consume more than 2 drinks per day. One drink is considered to be 12 ounces (355 mL) of beer, 5 ounces (148 mL) of wine, or 1.5 ounces (44 mL) of liquor.  . If you are 52-24 years old, ask your health care provider if you should take aspirin  to prevent strokes.  . Use sunscreen. Apply sunscreen liberally and repeatedly throughout the day. You should seek shade when your shadow is shorter than you. Protect yourself by wearing long sleeves, pants, a wide-brimmed hat, and sunglasses year round, whenever you are outdoors.  . Once a month, do a whole body skin exam, using a mirror to look at the skin on your back. Tell your health care provider of new moles, moles that have irregular borders, moles that are larger than a pencil eraser, or moles that have changed in shape or color.

## 2016-11-14 NOTE — Progress Notes (Signed)
   Subjective:    Patient ID: [name] [name], male    DOB: 29-Dec-1933, 83 y.o.   MRN: [id number]  HPI I reviewed health advisor's note, was available for consultation, and agree with documentation and plan.    Review of Systems     Objective:   Physical Exam        Assessment & Plan:

## 2016-11-14 NOTE — Progress Notes (Signed)
Subjective:   [name] [name] is a 83 y.o. male who presents for Medicare Annual/Subsequent preventive examination.  Review of Systems:  N/A Cardiac Risk Factors include: advanced age (>70mn, >>72women);male gender;obesity (BMI >30kg/m2);hypertension     Objective:    Vitals: BP (!) 142/88 (BP Location: Left Arm, Patient Position: Sitting, Cuff Size: Large)   Pulse 74   Temp 98.9 F (37.2 C) (Oral)   Ht _0  (1.753 m) Comment: no shoes  Wt 271 lb 8 oz (123.2 kg)   SpO2 93% Comment: oxygen @ 4L [place]  BMI 40.09 kg/m   Body mass index is 40.09 kg/m.  Tobacco History  Smoking Status  . Former Smoker  . Packs/day: 1.50  . Years: 53.00  . Types: Cigarettes  . Quit date: 06/23/1999  Smokeless Tobacco  . Former [employer] developer . Types: Snuff, Chew     Counseling given: No   Past Medical History:  Diagnosis Date  . AAA (abdominal aortic aneurysm) (HCC)    3.8 cm CTA 05/2015  . Anxiety   . COPD (chronic obstructive pulmonary disease) (HCC)    on home O2 4LPM  . Diverticulosis   . Former smoker    quit 2001  . Hemorrhoids   . Kidney stones    passed spontaneously  . OSA on CPAP    last study- in the home possibility- 2015, pt. unsure   . Prediabetes    Hgb A1C 5.8 in Jan 2017  . PUD (peptic ulcer disease)   . Right knee DJD 10/31/2011   Severe  . S/P TAVR (transcatheter aortic valve replacement) 06/26/2015   26 mm Edwards Sapien 3 transcatheter heart valve placed via percutaneous right transfemoral approach  . Severe aortic stenosis    a. Echo 11/16:  Mod LVH, EF 55-60%, no RWMA, Gr 1 DD, severe AS (mean 51 mmHg; peak 86 mmHg), MAC, mild LAE  b. s/p TAVR on 06/26/2015 with 267mEdward Sapien 3 THV  . Shortness of breath dyspnea    Past Surgical History:  Procedure Laterality Date  . CARDIAC CATHETERIZATION N/A 05/15/2015   Procedure: Right/Left Heart Cath and Coronary Angiography;  Surgeon: MiSherren MochaMD;  [facility];  Service: Cardiovascular;   Laterality: N/A;  . CHOLECYSTECTOMY    . EYE SURGERY     bilateral cataracts removed, /w IOL  . KNEE SURGERY Right 1985   arthrosopic -   . TEE WITHOUT CARDIOVERSION N/A 06/26/2015   Procedure: TRANSESOPHAGEAL ECHOCARDIOGRAM (TEE);  Surgeon: MiSherren MochaMD;  [facility]: [facility];  Laterality: N/A;  . TONSILLECTOMY    . TRANSCATHETER AORTIC VALVE REPLACEMENT, TRANSFEMORAL N/A 06/26/2015   Procedure: TRANSCATHETER AORTIC VALVE REPLACEMENT, TRANSFEMORAL;  Surgeon: MiSherren MochaMD;  [facility] Service: Open Heart Surgery;  Laterality: N/A;   Family History  Problem Relation Age of Onset  . Stroke Mother   . Heart attack Mother [id number]     deceased  . Prostate cancer Father [id number]. Colon cancer Neg Hx    History  Sexual Activity  . Sexual activity: No    Outpatient Encounter Prescriptions as of 11/14/2016  Medication Sig  . albuterol (PROAIR HFA) 108 (90 Base) MCG/ACT inhaler INHALE 1-2 PUFFS INTO THE LUNGS EVERY 6 (SIX) HOURS AS NEEDED FOR WHEEZING.  . Marland Kitchenspirin EC 81 MG tablet Take 1 tablet (81 mg total) by mouth daily.  . budesonide-formoterol (SYMBICORT) 160-4.5 MCG/ACT inhaler Inhale 2 puffs into  the lungs 2 (two) times daily.  . furosemide (LASIX) 20 MG tablet Take 1 tablet (20 mg total) by mouth daily.  Marland Kitchen guaiFENesin (MUCINEX) 600 MG 12 hr tablet Take 600 mg by mouth 2 (two) times daily.   Marland Kitchen ibuprofen (ADVIL,MOTRIN) 200 MG tablet Take 200 mg by mouth every 6 (six) hours as needed (pain). Reported on 07/30/2015  . ipratropium-albuterol (DUONEB) 0.5-2.5 (3) MG/3ML SOLN Take 3 mLs by nebulization 3 (three) times daily.  Marland Kitchen LORazepam (ATIVAN) 1 MG tablet Take 0.25-0.5 tablets by mouth every 6 (six) hours as needed for anxiety.  . metoprolol tartrate (LOPRESSOR) 25 MG tablet TAKE 0.5 TABLETS (12.5 MG TOTAL) BY MOUTH 2 (TWO) TIMES DAILY.  Marland Kitchen OXYGEN Inhale into the lungs. 4 Liters  . polyethylene glycol (MIRALAX / GLYCOLAX) packet Take 17 g by mouth daily as  needed (constiptation).  Marland Kitchen Respiratory Therapy Supplies (FLUTTER) DEVI Use as directed  . sodium chloride (OCEAN) 0.65 % SOLN nasal spray Place 2 sprays into both nostrils daily as needed for congestion.   Marland Kitchen tiotropium (SPIRIVA) 18 MCG inhalation capsule Place 1 capsule (18 mcg total) into inhaler and inhale daily.   No facility-administered encounter medications on file as of 11/14/2016.     Activities of Daily Living In your present state of health, do you have any difficulty performing the following activities: 11/14/2016  Hearing? N  Vision? N  Difficulty concentrating or making decisions? N  Walking or climbing stairs? Y  Dressing or bathing? N  Doing errands, shopping? Y  Preparing Food and eating ? N  Using the Toilet? N  In the past six months, have you accidently leaked urine? Y  Do you have problems with loss of bowel control? N  Managing your Medications? N  Managing your Finances? N  Housekeeping or managing your Housekeeping? Y  Some recent data might be hidden    Patient Care Team: Tonia Ghent, MD as PCP - General (Family Medicine) Noralee Space, MD as Consulting Physician (Pulmonary Disease) Nahser, Wonda Cheng, MD as Consulting Physician (Cardiology)   Assessment:     Hearing Screening   _0  _1  _2  _3  _4  _5  _6  _7  _8   Right ear:   40 40 40  0    Left ear:   40 40 40  0    Vision Screening Comments: Last vision exam in 2017 with Dr. Gloriann Loan   Exercise Activities and Dietary recommendations Current Exercise Habits: Home exercise routine, Type of exercise: Other - see comments (arm and leg exercises while in bed), Time (Minutes): 30, Frequency (Times/Week): 7, Weekly Exercise (Minutes/Week): 210, Intensity: Mild, Exercise limited by: orthopedic condition(s)  Goals    . Increase physical activity          Starting 11/14/16, I will continue to do arm and leg exercises for 30 min 4-5 days per week.       Fall Risk Fall Risk  11/14/2016  12/27/2015 09/14/2015 09/14/2015 05/18/2014  Falls in the past year? Yes No Yes Yes No  Number falls in past yr: 1 - 1 1 -  Injury with Fall? No - No No -  Follow up - - Falls evaluation completed;Education provided;Falls prevention discussed Falls evaluation completed;Education provided;Falls prevention discussed -   Depression Screen PHQ 2/9 Scores 11/14/2016 12/27/2015 09/14/2015 09/14/2015  PHQ - 2 Score 2 0 0 0  PHQ- 9 Score 7 - - -    Cognitive Function MMSE - Mini Mental State Exam 11/14/2016 09/14/2015  Orientation to  time 5 5  Orientation to Place 5 5  Registration 3 3  Attention/ Calculation 0 0  Recall 2 3  Recall-comments pt was unable to recall 1 of 3 words -  Language- name 2 objects 0 0  Language- repeat 1 1  Language- follow 3 step command 3 3  Language- read & follow direction 0 0  Write a sentence 0 0  Copy design 0 0  Total score 19 20     PLEASE NOTE: A Mini-Cog screen was completed. Maximum score is 20. A value of 0 denotes this part of Folstein MMSE was not completed or the patient failed this part of the Mini-Cog screening.   Mini-Cog Screening Orientation to Time - Max 5 pts Orientation to Place - Max 5 pts Registration - Max 3 pts Recall - Max 3 pts Language Repeat - Max 1 pts Language Follow 3 Step Command - Max 3 pts     Immunization History  Administered Date(s) Administered  . Influenza Split 06/17/2011, 02/23/2012  . Influenza Whole 04/27/2006, 04/10/2009, 04/10/2010  . Influenza, Seasonal, Injecte, Preservative Fre 06/13/2016  . Influenza,inj,Quad PF,36+ Mos 03/17/2013, 04/24/2014, 03/17/2015, 07/15/2016  . Pneumococcal Polysaccharide-23 02/15/1999, 10/20/2011  . Td 02/05/2006  . Zoster 01/15/2009   Screening Tests Health Maintenance  Topic Date Due  . COLONOSCOPY  11/14/2025 (Originally 03/22/2012)  . TETANUS/TDAP  02/04/2026 (Originally 02/06/2016)  . INFLUENZA VACCINE  01/14/2017  . PNA vac Low Risk Adult  Completed      Plan:     I  have personally reviewed and addressed the Medicare Annual Wellness questionnaire and have noted the following in the patient's chart:  A. Medical and social history B. Use of alcohol, tobacco or illicit drugs  C. Current medications and supplements D. Functional ability and status E.  Nutritional status F.  Physical activity G. Advance directives H. List of other physicians I.  Hospitalizations, surgeries, and ER visits in previous 12 months J.  [place] to include hearing, vision, cognitive, depression L. Referrals and appointments - none  In addition, I have reviewed and discussed with patient certain preventive protocols, quality metrics, and best practice recommendations. A written personalized care plan for preventive services as well as general preventive health recommendations were provided to patient.  See attached scanned questionnaire for additional information.   Signed,   Lindell Noe, MHA, BS, LPN Health Coach

## 2016-11-20 ENCOUNTER — Ambulatory Visit (INDEPENDENT_AMBULATORY_CARE_PROVIDER_SITE_OTHER): Payer: PPO | Admitting: Family Medicine

## 2016-11-20 ENCOUNTER — Encounter: Payer: Self-pay | Admitting: Family Medicine

## 2016-11-20 ENCOUNTER — Ambulatory Visit (INDEPENDENT_AMBULATORY_CARE_PROVIDER_SITE_OTHER)
Admission: RE | Admit: 2016-11-20 | Discharge: 2016-11-20 | Disposition: A | Discharge status: Home / Self Care | Payer: PPO | Source: Ambulatory Visit | Attending: Family Medicine | Admitting: Family Medicine

## 2016-11-20 VITALS — BP 150/80 | HR 86 | Temp 98.0°F | Ht 69.0 in | Wt 283.8 lb

## 2016-11-20 DIAGNOSIS — E559 Vitamin D deficiency, unspecified: Secondary | ICD-10-CM

## 2016-11-20 DIAGNOSIS — I714 Abdominal aortic aneurysm, without rupture, unspecified: Secondary | ICD-10-CM

## 2016-11-20 DIAGNOSIS — I10 Essential (primary) hypertension: Secondary | ICD-10-CM | POA: Diagnosis not present

## 2016-11-20 DIAGNOSIS — J9610 Chronic respiratory failure, unspecified whether with hypoxia or hypercapnia: Secondary | ICD-10-CM | POA: Diagnosis not present

## 2016-11-20 DIAGNOSIS — R109 Unspecified abdominal pain: Secondary | ICD-10-CM

## 2016-11-20 DIAGNOSIS — R238 Other skin changes: Secondary | ICD-10-CM | POA: Diagnosis not present

## 2016-11-20 DIAGNOSIS — Z9181 History of falling: Secondary | ICD-10-CM

## 2016-11-20 DIAGNOSIS — F411 Generalized anxiety disorder: Secondary | ICD-10-CM

## 2016-11-20 MED ORDER — FLUOCINONIDE 0.05 % EX CREA: 1.0000 "application " | TOPICAL_CREAM | Freq: Two times a day (BID) | CUTANEOUS | 1 refills | Status: DC | PRN

## 2016-11-20 NOTE — Patient Instructions (Addendum)
Check your BP a few times out of the clinic and update the heart clinic if consistently >140/>90.  Take care.  Glad to see you.  We'll contact you with your xray report.

## 2016-11-20 NOTE — Progress Notes (Signed)
Fall risk - hx of fall with no injury or medical treatment- cautions d/w pt.  Using walker at baseline.   Depression score: 7- d/w pt.   "I can get a little grouchy."  Mild sx, he'll update me as needed, d/w pt.  no suicidal or homicidal intent. He was not at the point of wanting treatment. Hearing - failed- d/w pt.  Declined hearing aids.  Mini-Cog score: 19/20 - d/w pt.   No deficits at home.  We elected to follow clinically.  No red flag sx.  Wife hasn't noted troubles.    Prostate cancer screening and PSA options (with potential risks and benefits of testing vs not testing) were discussed along with recent recs/guidelines.  He declined testing PSA at this point. Colon cancer screening declined, d/w pt, given his age and pulmonary situation.    Advance directive- wife designated if patient were incapacitated.   Per cards. AAA - 3.8 cm at staging CT prior to TAVR.  FU ultrasound recommended in 05/2017.   Vit D def d/w pt.   discussed with patient about supplement. We can recheck periodically.  H/o itchy rash that resolves with flucinonide 0.05%, used rarely.    Chronic respiratory failure on O2. He had to stop spiriva due to dry mouth.  SOB not worse with the change.  Dry mouth got better.  Still on other baseline inhalers without complication. Compliant. He does worse on hot days during summertime if he goes outside during the middle of the day.  Hypertension:    Using medication without problems or lightheadedness: yes Chest pain with exertion:no Edema:controlled with lasix, steady with med use.   Short of breath: at baseline.   Recheck BP 150/80.    He thought he had a renal stone with some intermittent R flank pain.  Passed a stone several years ago.  Return of pain the last few weeks.  Intermittent pain.  No blood in urine.  Some occ dysuria, not consistent.     PMH and SH reviewed  ROS: Per HPI unless specifically indicated in ROS section   Meds, vitals, and allergies reviewed.    GEN: nad, alert and oriented, on O2 via nasal cannula. Speaking in complete sentences. HEENT: mucous membranes moist NECK: supple w/o LA CV: rrr.  no murmur PULM: Global decrease in breath sounds but otherwise ctab, no inc wob ABD: soft, +bs EXT: no edema SKIN: no acute rash but chronic BLE changes noted, with thickening of skin on the shins noted.

## 2016-11-21 DIAGNOSIS — E559 Vitamin D deficiency, unspecified: Secondary | ICD-10-CM | POA: Insufficient documentation

## 2016-11-21 DIAGNOSIS — R109 Unspecified abdominal pain: Secondary | ICD-10-CM | POA: Insufficient documentation

## 2016-11-21 NOTE — Assessment & Plan Note (Signed)
Reasonable control. No change in medications. Blood pressure may be influenced by pain. Recheck blood pressure is reasonable. Continue work on weight loss diet.

## 2016-11-21 NOTE — Assessment & Plan Note (Signed)
discussed with patient about supplement. We can recheck periodically.

## 2016-11-21 NOTE — Assessment & Plan Note (Signed)
Noted. No, pain. Per cardiology. Not yet due for follow-up imaging.

## 2016-11-21 NOTE — Assessment & Plan Note (Signed)
Okay to continue as needed topical steroid.

## 2016-11-21 NOTE — Assessment & Plan Note (Signed)
Unable to tolerate Spiriva. Continue baseline meds. Continue oxygen. Okay for outpatient follow-up.

## 2016-11-21 NOTE — Assessment & Plan Note (Signed)
With some episodic moodiness. Okay for outpatient follow-up. He will update me if his mood is worse. He agrees.

## 2016-11-21 NOTE — Assessment & Plan Note (Signed)
No symptoms at this point. Benign abdominal exam. History of renal stones noted. No stones noted on KUB. Observe for now. Update me if more symptoms. Okay for outpatient follow-up. >25 minutes spent in face to face time with patient, >50% spent in counselling or coordination of care.

## 2016-11-21 NOTE — Assessment & Plan Note (Signed)
Cautions discussed with patient. He agrees.

## 2016-11-22 DIAGNOSIS — J449 Chronic obstructive pulmonary disease, unspecified: Secondary | ICD-10-CM | POA: Diagnosis not present

## 2016-11-27 ENCOUNTER — Other Ambulatory Visit: Payer: Self-pay | Admitting: Family Medicine

## 2016-11-27 NOTE — Telephone Encounter (Signed)
Electronic refill request. Lorazepam. Last office visit:   11/20/16 Last Filled:   03/17/2016 Please advise.

## 2016-11-28 NOTE — Telephone Encounter (Signed)
Medication phoned to pharmacy.  

## 2016-11-28 NOTE — Telephone Encounter (Signed)
Please call in.  Thanks.   

## 2016-12-02 ENCOUNTER — Encounter: Payer: Self-pay | Admitting: Emergency Medicine

## 2016-12-02 ENCOUNTER — Emergency Department: Payer: PPO

## 2016-12-02 ENCOUNTER — Observation Stay
Admission: EM | Admit: 2016-12-02 | Discharge: 2016-12-03 | Disposition: A | Discharge status: Home / Self Care | Payer: PPO | Attending: Internal Medicine | Admitting: Internal Medicine

## 2016-12-02 DIAGNOSIS — I5032 Chronic diastolic (congestive) heart failure: Secondary | ICD-10-CM | POA: Insufficient documentation

## 2016-12-02 DIAGNOSIS — I7389 Other specified peripheral vascular diseases: Secondary | ICD-10-CM | POA: Insufficient documentation

## 2016-12-02 DIAGNOSIS — Z8711 Personal history of peptic ulcer disease: Secondary | ICD-10-CM | POA: Insufficient documentation

## 2016-12-02 DIAGNOSIS — M791 Myalgia, unspecified site: Secondary | ICD-10-CM | POA: Diagnosis present

## 2016-12-02 DIAGNOSIS — G4733 Obstructive sleep apnea (adult) (pediatric): Secondary | ICD-10-CM | POA: Insufficient documentation

## 2016-12-02 DIAGNOSIS — Z9981 Dependence on supplemental oxygen: Secondary | ICD-10-CM | POA: Insufficient documentation

## 2016-12-02 DIAGNOSIS — F411 Generalized anxiety disorder: Secondary | ICD-10-CM | POA: Insufficient documentation

## 2016-12-02 DIAGNOSIS — Z8249 Family history of ischemic heart disease and other diseases of the circulatory system: Secondary | ICD-10-CM | POA: Insufficient documentation

## 2016-12-02 DIAGNOSIS — K760 Fatty (change of) liver, not elsewhere classified: Secondary | ICD-10-CM | POA: Diagnosis not present

## 2016-12-02 DIAGNOSIS — N200 Calculus of kidney: Secondary | ICD-10-CM | POA: Insufficient documentation

## 2016-12-02 DIAGNOSIS — R7303 Prediabetes: Secondary | ICD-10-CM | POA: Insufficient documentation

## 2016-12-02 DIAGNOSIS — Z8042 Family history of malignant neoplasm of prostate: Secondary | ICD-10-CM | POA: Insufficient documentation

## 2016-12-02 DIAGNOSIS — J111 Influenza due to unidentified influenza virus with other respiratory manifestations: Secondary | ICD-10-CM | POA: Diagnosis not present

## 2016-12-02 DIAGNOSIS — I110 Hypertensive heart disease with heart failure: Secondary | ICD-10-CM | POA: Diagnosis not present

## 2016-12-02 DIAGNOSIS — J9611 Chronic respiratory failure with hypoxia: Secondary | ICD-10-CM | POA: Diagnosis not present

## 2016-12-02 DIAGNOSIS — Z79899 Other long term (current) drug therapy: Secondary | ICD-10-CM | POA: Insufficient documentation

## 2016-12-02 DIAGNOSIS — I350 Nonrheumatic aortic (valve) stenosis: Secondary | ICD-10-CM | POA: Insufficient documentation

## 2016-12-02 DIAGNOSIS — E876 Hypokalemia: Secondary | ICD-10-CM | POA: Diagnosis not present

## 2016-12-02 DIAGNOSIS — Z888 Allergy status to other drugs, medicaments and biological substances status: Secondary | ICD-10-CM | POA: Insufficient documentation

## 2016-12-02 DIAGNOSIS — R52 Pain, unspecified: Secondary | ICD-10-CM | POA: Diagnosis not present

## 2016-12-02 DIAGNOSIS — R6889 Other general symptoms and signs: Secondary | ICD-10-CM

## 2016-12-02 DIAGNOSIS — I714 Abdominal aortic aneurysm, without rupture: Secondary | ICD-10-CM | POA: Insufficient documentation

## 2016-12-02 DIAGNOSIS — I5033 Acute on chronic diastolic (congestive) heart failure: Secondary | ICD-10-CM | POA: Diagnosis present

## 2016-12-02 DIAGNOSIS — J439 Emphysema, unspecified: Secondary | ICD-10-CM | POA: Diagnosis not present

## 2016-12-02 DIAGNOSIS — W57XXXA Bitten or stung by nonvenomous insect and other nonvenomous arthropods, initial encounter: Secondary | ICD-10-CM | POA: Insufficient documentation

## 2016-12-02 DIAGNOSIS — Z823 Family history of stroke: Secondary | ICD-10-CM | POA: Insufficient documentation

## 2016-12-02 DIAGNOSIS — R079 Chest pain, unspecified: Secondary | ICD-10-CM | POA: Diagnosis not present

## 2016-12-02 DIAGNOSIS — Z87442 Personal history of urinary calculi: Secondary | ICD-10-CM | POA: Insufficient documentation

## 2016-12-02 DIAGNOSIS — Z87891 Personal history of nicotine dependence: Secondary | ICD-10-CM | POA: Insufficient documentation

## 2016-12-02 DIAGNOSIS — Z9989 Dependence on other enabling machines and devices: Secondary | ICD-10-CM | POA: Diagnosis not present

## 2016-12-02 DIAGNOSIS — I700 Atherosclerosis of aorta: Secondary | ICD-10-CM | POA: Insufficient documentation

## 2016-12-02 DIAGNOSIS — M1711 Unilateral primary osteoarthritis, right knee: Secondary | ICD-10-CM | POA: Insufficient documentation

## 2016-12-02 DIAGNOSIS — Z882 Allergy status to sulfonamides status: Secondary | ICD-10-CM | POA: Insufficient documentation

## 2016-12-02 DIAGNOSIS — Z7982 Long term (current) use of aspirin: Secondary | ICD-10-CM | POA: Insufficient documentation

## 2016-12-02 DIAGNOSIS — R51 Headache: Secondary | ICD-10-CM | POA: Diagnosis not present

## 2016-12-02 DIAGNOSIS — Z9049 Acquired absence of other specified parts of digestive tract: Secondary | ICD-10-CM | POA: Insufficient documentation

## 2016-12-02 DIAGNOSIS — K5730 Diverticulosis of large intestine without perforation or abscess without bleeding: Secondary | ICD-10-CM | POA: Diagnosis not present

## 2016-12-02 DIAGNOSIS — E559 Vitamin D deficiency, unspecified: Secondary | ICD-10-CM | POA: Insufficient documentation

## 2016-12-02 DIAGNOSIS — S60561A Insect bite (nonvenomous) of right hand, initial encounter: Secondary | ICD-10-CM | POA: Insufficient documentation

## 2016-12-02 DIAGNOSIS — I10 Essential (primary) hypertension: Secondary | ICD-10-CM | POA: Diagnosis present

## 2016-12-02 DIAGNOSIS — Z952 Presence of prosthetic heart valve: Secondary | ICD-10-CM | POA: Insufficient documentation

## 2016-12-02 DIAGNOSIS — K5790 Diverticulosis of intestine, part unspecified, without perforation or abscess without bleeding: Secondary | ICD-10-CM | POA: Diagnosis not present

## 2016-12-02 DIAGNOSIS — R531 Weakness: Secondary | ICD-10-CM | POA: Diagnosis not present

## 2016-12-02 LAB — URINALYSIS, COMPLETE (UACMP) WITH MICROSCOPIC
BACTERIA UA: NONE SEEN
Bilirubin Urine: NEGATIVE
Glucose, UA: NEGATIVE mg/dL
HGB URINE DIPSTICK: NEGATIVE
KETONES UR: NEGATIVE mg/dL
LEUKOCYTES UA: NEGATIVE
Nitrite: NEGATIVE
PROTEIN: 100 mg/dL — AB
RBC / HPF: NONE SEEN RBC/hpf (ref 0–5)
Specific Gravity, Urine: 1.02 (ref 1.005–1.030)
WBC UA: NONE SEEN WBC/hpf (ref 0–5)
pH: 7 (ref 5.0–8.0)

## 2016-12-02 LAB — COMPREHENSIVE METABOLIC PANEL
ALBUMIN: 4 g/dL (ref 3.5–5.0)
ALT: 13 U/L — ABNORMAL LOW (ref 17–63)
ANION GAP: 8 (ref 5–15)
AST: 23 U/L (ref 15–41)
Alkaline Phosphatase: 90 U/L (ref 38–126)
BILIRUBIN TOTAL: 1.6 mg/dL — AB (ref 0.3–1.2)
BUN: 18 mg/dL (ref 6–20)
CHLORIDE: 95 mmol/L — AB (ref 101–111)
CO2: 34 mmol/L — ABNORMAL HIGH (ref 22–32)
Calcium: 9.4 mg/dL (ref 8.9–10.3)
Creatinine, Ser: 0.84 mg/dL (ref 0.61–1.24)
GFR calc Af Amer: >60 mL/min (ref >60)
GFR calc non Af Amer: >60 mL/min (ref >60)
GLUCOSE: 101 mg/dL — AB (ref 65–99)
POTASSIUM: 3.8 mmol/L (ref 3.5–5.1)
SODIUM: 137 mmol/L (ref 135–145)
TOTAL PROTEIN: 7.5 g/dL (ref 6.5–8.1)

## 2016-12-02 LAB — CBC
HCT: 40.1 % (ref 40.0–52.0)
Hemoglobin: 13.7 g/dL (ref 13.0–18.0)
MCH: 31.5 pg (ref 26.0–34.0)
MCHC: 34.2 g/dL (ref 32.0–36.0)
MCV: 92 fL (ref 80.0–100.0)
PLATELETS: 203 10*3/uL (ref 150–440)
RBC: 4.36 MIL/uL — ABNORMAL LOW (ref 4.40–5.90)
RDW: 13.7 % (ref 11.5–14.5)
WBC: 9.2 10*3/uL (ref 3.8–10.6)

## 2016-12-02 LAB — TROPONIN I

## 2016-12-02 MED ORDER — FENTANYL CITRATE (PF) 100 MCG/2ML IJ SOLN
50.0000 ug | Freq: Once | INTRAMUSCULAR | Status: DC
  Filled 2016-12-02: qty 2

## 2016-12-02 MED ORDER — ACETAMINOPHEN 325 MG PO TABS
650.0000 mg | ORAL_TABLET | Freq: Once | ORAL | Status: AC
  Administered 2016-12-02: 650 mg via ORAL
  Filled 2016-12-02: qty 2

## 2016-12-02 NOTE — ED Provider Notes (Addendum)
[facility] Emergency Department Provider Note  ____________________________________________   I have reviewed the triage vital signs and the nursing notes.   HISTORY  Chief Complaint Weakness and Generalized Body Aches    HPI [name] [name] is a 83 y.o. male who presents today complaining of allover body aches for for 5 days. His legs hurt he feels generally weak. He did notice take of himself however his symptoms seem to have begun before the take. Denies any rash. He has had low-grade fevers. He denies any nausea or vomiting he is eating well, he does feel somewhat constipated. He has had urinary frequency and dysuria during this time interval. He did have some headaches last week but those are gone. They were gradual onset not worst headaches of life he is not having a headache at this time. He filling both legs are too weak for him to get around and this is a new thing. When they noticed the tick, they began to home treat with doxycycline 100 mg a day from his wife's leftover supply from her prior tick bite. Patient denies any abdominal pain. He is eating and drinking well but he is too weak to get around. He also has a history of kidney stones and has a little bit of left flank pain off and on. Nothing makes his symptoms better nothing make his symptoms worse, he complains of joint pain everywhere.   Past Medical History:  Diagnosis Date  . AAA (abdominal aortic aneurysm) (HCC)    3.8 cm CTA 05/2015  . Anxiety   . COPD (chronic obstructive pulmonary disease) (HCC)    on home O2 4LPM  . Diverticulosis   . Former smoker    quit 2001  . Hemorrhoids   . Kidney stones    passed spontaneously  . OSA on CPAP    last study- in the home possibility- 2015, pt. unsure   . Prediabetes    Hgb A1C 5.8 in Jan 2017  . PUD (peptic ulcer disease)   . Right knee DJD 10/31/2011   Severe  . S/P TAVR (transcatheter aortic valve replacement) 06/26/2015   26 mm Edwards Sapien  3 transcatheter heart valve placed via percutaneous right transfemoral approach  . Severe aortic stenosis    a. Echo 11/16:  Mod LVH, EF 55-60%, no RWMA, Gr 1 DD, severe AS (mean 51 mmHg; peak 86 mmHg), MAC, mild LAE  b. s/p TAVR on 06/26/2015 with 28m Edward Sapien 3 THV  . Shortness of breath dyspnea     Patient Active Problem List   Diagnosis Date Noted  . Vitamin D deficiency 11/21/2016  . Flank pain 11/21/2016  . Chronic diastolic CHF (congestive heart failure) ([place] 07/13/2016  . AAA (abdominal aortic aneurysm) without rupture ([place] 07/13/2016  . COPD mixed type ([place] 12/27/2015  . Fecal urgency 09/14/2015  . Dark urine 09/14/2015  . S/P TAVR (transcatheter aortic valve replacement) 06/26/2015  . Severe aortic stenosis 05/15/2015  . Medicare annual wellness visit, subsequent 05/21/2014  . Advance care planning 05/21/2014  . Skin irritation 12/20/2013  . Risk for falls 12/20/2013  . Rib pain on right side 01/29/2013  . Vertigo 07/01/2012  . Right knee DJD 10/31/2011  . Knee pain 10/23/2011  . OSA (obstructive sleep apnea) 07/31/2009  . Chronic respiratory failure, unspecified whether with hypoxia or hypercapnia ([place] 08/03/2008  . Morbid obesity ([place] 02/22/2008  . POLYP, COLON 08/27/2007  . Anxiety state 08/27/2007  . COPD (chronic obstructive pulmonary disease) with  emphysema ([place]) 08/27/2007  . INTERNAL HEMORRHOIDS 03/23/2007  . DIVERTICULOSIS, COLON 03/23/2007  . SYMPTOM, PAIN, ABDOMINAL, GENERALIZED 02/18/2007  . HYPERGLYCEMIA 02/18/2007  . HYPERTENSION, BENIGN ESSENTIAL 02/10/2007  . NEOPLASM, SKIN, UNCERTAIN BEHAVIOR 26/71/2458  . PEPTIC ULCER DISEASE WITH H-PYLORI  TX'D 11/19/2006  . NEPHROLITHIASIS 11/19/2006  . BACK PAIN, CHRONIC 11/19/2006  . BENIGN PROSTATIC HYPERTROPHY, HX OF 11/19/2006    Past Surgical History:  Procedure Laterality Date  . CARDIAC CATHETERIZATION N/A 05/15/2015   Procedure: Right/Left Heart Cath and Coronary Angiography;  Surgeon:  [name], MD;  Location: [facility];  Service: Cardiovascular;  Laterality: N/A;  . CHOLECYSTECTOMY    . EYE SURGERY     bilateral cataracts removed, /w IOL  . KNEE SURGERY Right 1985   arthrosopic -   . TEE WITHOUT CARDIOVERSION N/A 06/26/2015   Procedure: TRANSESOPHAGEAL ECHOCARDIOGRAM (TEE);  Surgeon: [name], MD;  Location: [facility];  Service: Open Heart Surgery;  Laterality: N/A;  . TONSILLECTOMY    . TRANSCATHETER AORTIC VALVE REPLACEMENT, TRANSFEMORAL N/A 06/26/2015   Procedure: TRANSCATHETER AORTIC VALVE REPLACEMENT, TRANSFEMORAL;  Surgeon: [name], MD;  Location: [facility];  Service: Open Heart Surgery;  Laterality: N/A;    Prior to Admission medications   Medication Sig Start Date End Date Taking? Authorizing Provider  albuterol (PROAIR HFA) 108 (90 Base) MCG/ACT inhaler INHALE 1-2 PUFFS INTO THE LUNGS EVERY 6 (SIX) HOURS AS NEEDED FOR WHEEZING. 08/08/16  Yes Noralee Space, MD  aspirin EC 81 MG tablet Take 1 tablet (81 mg total) by mouth daily. 04/24/15  Yes Weaver, Scott T, PA-C  budesonide-formoterol (SYMBICORT) 160-4.5 MCG/ACT inhaler Inhale 2 puffs into the lungs 2 (two) times daily. 08/08/16  Yes Noralee Space, MD  fluocinonide cream (LIDEX) 0.99 % Apply 1 application topically 2 (two) times daily as needed. Use sparingly. 11/20/16  Yes Tonia Ghent, MD  furosemide (LASIX) 20 MG tablet Take 1 tablet (20 mg total) by mouth daily. 05/21/16  Yes [name], MD  guaiFENesin (MUCINEX) 600 MG 12 hr tablet Take 600 mg by mouth 2 (two) times daily.    Yes [provider]  ibuprofen (ADVIL,MOTRIN) 200 MG tablet Take 200 mg by mouth every 6 (six) hours as needed (pain). Reported on 07/30/2015   Yes [provider]  ipratropium-albuterol (DUONEB) 0.5-2.5 (3) MG/3ML SOLN Take 3 mLs by nebulization 3 (three) times daily. 12/27/15  Yes Noralee Space, MD  LORazepam (ATIVAN) 1 MG tablet TAKE 1/4 TO 1/2 TABLET BY MOUTH EVERY 6 HOURS AS NEEDED 11/28/16  Yes  Tonia Ghent, MD  metoprolol tartrate (LOPRESSOR) 25 MG tablet TAKE 0.5 TABLETS (12.5 MG TOTAL) BY MOUTH 2 (TWO) TIMES DAILY. 06/10/16  Yes [name], MD  OXYGEN Inhale into the lungs. 4 Liters   Yes [provider]  polyethylene glycol (MIRALAX / GLYCOLAX) packet Take 17 g by mouth daily as needed (constiptation).   Yes [provider]  Respiratory Therapy Supplies (FLUTTER) DEVI Use as directed 02/23/12  Yes Clance, Armando Reichert, MD  sodium chloride (OCEAN) 0.65 % SOLN nasal spray Place 2 sprays into both nostrils daily as needed for congestion.    Yes [provider]    Allergies Iohexol; Atorvastatin; Celecoxib; Clinoril [sulindac]; Nitrofurantoin; Spiriva handihaler [tiotropium bromide monohydrate]; and Sulfa antibiotics  Family History  Problem Relation Age of Onset  . Stroke Mother   . Heart attack Mother 14       deceased  . Prostate cancer Father 35  .  Colon cancer Neg Hx     Social History Social History  Substance Use Topics  . Smoking status: Former Smoker    Packs/day: 1.50    Years: 53.00    Types: Cigarettes    Quit date: 06/23/1999  . Smokeless tobacco: Former Systems developer    Types: Snuff, Chew  . Alcohol use No    Review of Systems Constitutional: Low-grade fever/chills Eyes: No visual changes. ENT: No sore throat. No stiff neck no neck pain Cardiovascular: Denies chest pain. Respiratory: Denies shortness of breath. Gastrointestinal:   no vomiting.  No diarrhea.  No constipation. Genitourinary: Negative for dysuria. Musculoskeletal: Negative lower extremity swelling Skin: Negative for rash. Neurological: Negative for severe headaches, focal weakness or numbness.   ____________________________________________   PHYSICAL EXAM:  VITAL SIGNS: ED Triage Vitals  Enc Vitals Group     BP 12/02/16 2009 (!) 204/80     Pulse Rate 12/02/16 2009 90     Resp 12/02/16 2009 18     Temp 12/02/16 2009 100 F (37.8 C)     Temp Source  12/02/16 2009 Oral     SpO2 12/02/16 2009 92 %     Weight 12/02/16 2010 272 lb (123.4 kg)     Height 12/02/16 2010 _0  (1.778 m)     Head Circumference --      Peak Flow --      Pain Score 12/02/16 2009 10     Pain Loc --      Pain Edu? --      Excl. in [place]? --     Constitutional: Alert and oriented. Looks chronically unwell appearing not acutely toxic however  Eyes: Conjunctivae are normal Head: Atraumatic HEENT: No congestion/rhinnorhea. Mucous membranes are moist.  Oropharynx non-erythematous Neck:   Nontender with no meningismus, no masses, no stridor Cardiovascular: Normal rate, regular rhythm. Grossly normal heart sounds.  Good peripheral circulation. Respiratory: Normal respiratory effort.  No retractions. Lungs CTAB. Abdominal: Soft and nontender. No distention. No guarding no rebound Back:  There is no focal tenderness or step off.  there is no midline tenderness there are no lesions noted. there is mild left CVA tenderness Musculoskeletal: No lower extremity tenderness, no upper extremity tenderness. No joint effusions, no DVT signs strong distal pulses no edema Neurologic:  Normal speech and language. No gross focal neurologic deficits are appreciated.  Skin:  Skin is warm, dry and intact. Skin see since consistent with chronic venous stasis bilateral lower extremity. Psychiatric: Mood and affect are normal. Speech and behavior are normal.  ____________________________________________   LABS (all labs ordered are listed, but only abnormal results are displayed)  Labs Reviewed  CBC - Abnormal; Notable for the following:       Result Value   RBC 4.36 (*)    All other components within normal limits  COMPREHENSIVE METABOLIC PANEL - Abnormal; Notable for the following:    Chloride 95 (*)    CO2 34 (*)    Glucose, Bld 101 (*)    ALT 13 (*)    Total Bilirubin 1.6 (*)    All other components within normal limits  URINE CULTURE  TROPONIN I  URINALYSIS, COMPLETE  (UACMP) WITH MICROSCOPIC  CBG MONITORING, ED   ____________________________________________  EKG  I personally interpreted any EKGs ordered by me or triage Sinus rhythm rate 88 bpm no acute ST elevation or acute ST depression normal axis nonspecific ST changes ____________________________________________  RADIOLOGY  I reviewed any imaging ordered by me or triage  that were performed during my shift and, if possible, patient and/or family made aware of any abnormal findings. ____________________________________________   PROCEDURES  Procedure(s) performed: None  Procedures  Critical Care performed: None  ____________________________________________   INITIAL IMPRESSION / ASSESSMENT AND PLAN / ED COURSE  Pertinent labs & imaging results that were available during my care of the patient were reviewed by me and considered in my medical decision making (see chart for details).  Patient here with whole body aches, dysuria, generalized weakness and myalgias. Unclear etiology multiple different pathologies could be due to this. Does have a low-grade temperature. Did have a tick bite, although symptoms seemed to predate at least when he noticed the tick. Swelling no rash to suggest Lyme's disease. No rashes suggest [facility] spotted fever. Course he has been taking doxycycline could be partially treated I suppose. Most likely however his urinary tract infection or other such pathology, we will obtain CT of his head for his headaches, which is actually negative, CT abdomen and pelvis to rule out kidney stone which is negative, basic blood work which is thus far reassuring, and we will obtain urinalysis and reassess. Given the patient's low-grade fevers and generalized pain and progressive weakness requiring assistance getting into bed I think he likely will need [place] for further evaluation  ----------------------------------------- 11:47 PM on  12/02/2016 -----------------------------------------  Patient in no acute distress at this time however did take 3 or 4 people to get him in here and he is generally weak to the point where he is unable to really get around. No evidence of CVA, does not appear to be focally weak although again neurologic exam is somewhat limited by patient ability to engage. I feel because he has a low-grade fever here of 100, and he has had partial treatment of possible tickborne illness with doxycycline, half dose, for 5 days, he might benefit from observational stay for fever and weakness     ____________________________________________   FINAL CLINICAL IMPRESSION(S) / ED DIAGNOSES  Final diagnoses:  None      This chart was dictated using voice recognition software.  Despite best efforts to proofread,  errors can occur which can change meaning.      Schuyler Amor, MD 12/02/16 2240    Schuyler Amor, MD 12/02/16 2241    Schuyler Amor, MD 12/02/16 2348

## 2016-12-02 NOTE — ED Notes (Signed)
Upon assessment pt has generalized pain all over body but states his head hurts the worst. Pt also reports increased weakness that has decreased pt's ability to perform ADL. Pt also states he had a tick bite on 6/13. Pt denies any acute change in breathing or chest pain. Pt alert and oriented x 4.

## 2016-12-03 ENCOUNTER — Ambulatory Visit: Payer: PPO | Admitting: Family Medicine

## 2016-12-03 DIAGNOSIS — W57XXXA Bitten or stung by nonvenomous insect and other nonvenomous arthropods, initial encounter: Secondary | ICD-10-CM | POA: Diagnosis present

## 2016-12-03 DIAGNOSIS — M791 Myalgia, unspecified site: Secondary | ICD-10-CM | POA: Diagnosis present

## 2016-12-03 DIAGNOSIS — I10 Essential (primary) hypertension: Secondary | ICD-10-CM | POA: Diagnosis not present

## 2016-12-03 DIAGNOSIS — I5032 Chronic diastolic (congestive) heart failure: Secondary | ICD-10-CM | POA: Diagnosis not present

## 2016-12-03 DIAGNOSIS — J449 Chronic obstructive pulmonary disease, unspecified: Secondary | ICD-10-CM | POA: Diagnosis not present

## 2016-12-03 LAB — CBC
HEMATOCRIT: 36.3 % — AB (ref 40.0–52.0)
Hemoglobin: 12.5 g/dL — ABNORMAL LOW (ref 13.0–18.0)
MCH: 31.7 pg (ref 26.0–34.0)
MCHC: 34.4 g/dL (ref 32.0–36.0)
MCV: 92.2 fL (ref 80.0–100.0)
Platelets: 189 10*3/uL (ref 150–440)
RBC: 3.94 MIL/uL — ABNORMAL LOW (ref 4.40–5.90)
RDW: 13.3 % (ref 11.5–14.5)
WBC: 8.4 10*3/uL (ref 3.8–10.6)

## 2016-12-03 LAB — BASIC METABOLIC PANEL
Anion gap: 8 (ref 5–15)
BUN: 17 mg/dL (ref 6–20)
CHLORIDE: 97 mmol/L — AB (ref 101–111)
CO2: 36 mmol/L — ABNORMAL HIGH (ref 22–32)
Calcium: 8.9 mg/dL (ref 8.9–10.3)
Creatinine, Ser: 0.75 mg/dL (ref 0.61–1.24)
GFR calc Af Amer: >60 mL/min (ref >60)
GLUCOSE: 159 mg/dL — AB (ref 65–99)
POTASSIUM: 3.4 mmol/L — AB (ref 3.5–5.1)
Sodium: 141 mmol/L (ref 135–145)

## 2016-12-03 MED ORDER — LORAZEPAM 0.5 MG PO TABS: 0.5000 mg | ORAL_TABLET | Freq: Four times a day (QID) | ORAL | Status: DC | PRN

## 2016-12-03 MED ORDER — POTASSIUM CHLORIDE CRYS ER 20 MEQ PO TBCR
40.0000 meq | EXTENDED_RELEASE_TABLET | Freq: Once | ORAL | Status: AC
  Administered 2016-12-03: 40 meq via ORAL
  Filled 2016-12-03: qty 2

## 2016-12-03 MED ORDER — METOPROLOL TARTRATE 25 MG PO TABS
12.5000 mg | ORAL_TABLET | Freq: Two times a day (BID) | ORAL | Status: DC
  Administered 2016-12-03 (×2): 12.5 mg via ORAL
  Filled 2016-12-03 (×2): qty 1

## 2016-12-03 MED ORDER — ACETAMINOPHEN 325 MG PO TABS
650.0000 mg | ORAL_TABLET | Freq: Four times a day (QID) | ORAL | Status: DC | PRN
  Administered 2016-12-03: 650 mg via ORAL
  Filled 2016-12-03: qty 2

## 2016-12-03 MED ORDER — IPRATROPIUM-ALBUTEROL 0.5-2.5 (3) MG/3ML IN SOLN
3.0000 mL | Freq: Three times a day (TID) | RESPIRATORY_TRACT | Status: DC
  Administered 2016-12-03: 3 mL via RESPIRATORY_TRACT
  Filled 2016-12-03: qty 3

## 2016-12-03 MED ORDER — ONDANSETRON HCL 4 MG/2ML IJ SOLN: 4.0000 mg | Freq: Four times a day (QID) | INTRAMUSCULAR | Status: DC | PRN

## 2016-12-03 MED ORDER — DOXYCYCLINE HYCLATE 100 MG PO TABS: 100.0000 mg | ORAL_TABLET | Freq: Two times a day (BID) | ORAL | 0 refills | Status: AC

## 2016-12-03 MED ORDER — AMLODIPINE BESYLATE 5 MG PO TABS
5.0000 mg | ORAL_TABLET | Freq: Every day | ORAL | Status: DC
  Administered 2016-12-03: 5 mg via ORAL
  Filled 2016-12-03: qty 1

## 2016-12-03 MED ORDER — FUROSEMIDE 20 MG PO TABS
20.0000 mg | ORAL_TABLET | Freq: Every day | ORAL | Status: DC
  Administered 2016-12-03: 20 mg via ORAL
  Filled 2016-12-03: qty 1

## 2016-12-03 MED ORDER — IPRATROPIUM-ALBUTEROL 0.5-2.5 (3) MG/3ML IN SOLN: 3.0000 mL | Freq: Three times a day (TID) | RESPIRATORY_TRACT | Status: DC

## 2016-12-03 MED ORDER — ONDANSETRON HCL 4 MG PO TABS
4.0000 mg | ORAL_TABLET | Freq: Four times a day (QID) | ORAL | Status: DC | PRN
Start: 2016-12-03 — End: 2016-12-03

## 2016-12-03 MED ORDER — MOMETASONE FURO-FORMOTEROL FUM 200-5 MCG/ACT IN AERO
2.0000 | INHALATION_SPRAY | Freq: Two times a day (BID) | RESPIRATORY_TRACT | Status: DC
  Administered 2016-12-03: 2 via RESPIRATORY_TRACT
  Filled 2016-12-03: qty 8.8

## 2016-12-03 MED ORDER — ASPIRIN EC 81 MG PO TBEC
81.0000 mg | DELAYED_RELEASE_TABLET | Freq: Every day | ORAL | Status: DC
  Administered 2016-12-03: 81 mg via ORAL
  Filled 2016-12-03: qty 1

## 2016-12-03 MED ORDER — AMLODIPINE BESYLATE 5 MG PO TABS: 5.0000 mg | ORAL_TABLET | Freq: Every day | ORAL | 0 refills | Status: DC

## 2016-12-03 MED ORDER — ACETAMINOPHEN 650 MG RE SUPP: 650.0000 mg | Freq: Four times a day (QID) | RECTAL | Status: DC | PRN

## 2016-12-03 MED ORDER — ENOXAPARIN SODIUM 40 MG/0.4ML ~~LOC~~ SOLN
40.0000 mg | SUBCUTANEOUS | Status: DC
  Administered 2016-12-03: 40 mg via SUBCUTANEOUS
  Filled 2016-12-03: qty 0.4

## 2016-12-03 MED ORDER — DOXYCYCLINE HYCLATE 100 MG PO TABS
100.0000 mg | ORAL_TABLET | Freq: Two times a day (BID) | ORAL | Status: DC
  Administered 2016-12-03 (×2): 100 mg via ORAL
  Filled 2016-12-03 (×2): qty 1

## 2016-12-03 NOTE — Care Management (Signed)
Patient admitted from home with [facility].  Patient lives at home with wife.  PCP Damita Dunnings.  Pharmacy [facility].  Patient has chronic O2 through [facility].  Patient has RW and CPAP at home.  PT has assessed patient and recommended SNF.  Patient has declined SNF placement.  RNCM presented option of home health services.  Patient and his wife declined home health services at discharge.  I informed patient that if she changes his mind after discharge, he will need to follow up with his PCP to obtain orders for home health.  RNCM signing off.

## 2016-12-03 NOTE — Care Management Obs Status (Signed)
[facility] NOTIFICATION   Patient Details  Name: BRAYSEN CLOWARD MRN: [id number] Date of Birth: [birth year]   Medicare Observation Status Notification Given:  No (admitted less than 24 hours)    Beverly Sessions, RN 12/03/2016, 12:00 PM

## 2016-12-03 NOTE — Progress Notes (Signed)
Pt to be discharged per MD order. PT recommends SNF but pt refuses services and at this point is not interested in [facility]. Pt has walker and BSC available at home and feels this is adequate. Discharge instructions provided and pt verbalizes understanding. IV removed. SCripts provided. Will discharge in wheelchair. Portable o2 is available from home

## 2016-12-03 NOTE — H&P (Addendum)
[facility] - [place] at [facility]   PATIENT NAME: [name] [name]    MR#:  [id number]  DATE OF BIRTH:  03-27-1934  DATE OF ADMISSION:  12/02/2016  PRIMARY CARE PHYSICIAN: Joaquim Nam, MD   REQUESTING/REFERRING PHYSICIAN: Alphonzo Lemmings, MD  CHIEF COMPLAINT:   Chief Complaint  Patient presents with  . Weakness  . Generalized Body Aches    HISTORY OF PRESENT ILLNESS:  [name] [name]  is a 83 y.o. male who presents with myalgias and weakness.  Patient states he began feeling bad about 5 days ago.  He noticed a tick bite about 4 days ago on his hand.  He denies ever noticing any rash.  He did have some mild headache initially, but states that that has resolved. He denies any other symptoms, stating that he has not had any fever, chills, diarrhea, cough, shortness of breath. He took some doxycycline that his wife had, half dose at 100 mg daily.  Hospitalists were called for further evaluation.  PAST MEDICAL HISTORY:   Past Medical History:  Diagnosis Date  . AAA (abdominal aortic aneurysm) (HCC)    3.8 cm CTA 05/2015  . Anxiety   . COPD (chronic obstructive pulmonary disease) (HCC)    on home O2 4LPM  . Diverticulosis   . Former smoker    quit 2001  . Hemorrhoids   . Kidney stones    passed spontaneously  . OSA on CPAP    last study- in the home possibility- 2015, pt. unsure   . Prediabetes    Hgb A1C 5.8 in Jan 2017  . PUD (peptic ulcer disease)   . Right knee DJD 10/31/2011   Severe  . S/P TAVR (transcatheter aortic valve replacement) 06/26/2015   26 mm Edwards Sapien 3 transcatheter heart valve placed via percutaneous right transfemoral approach  . Severe aortic stenosis    a. Echo 11/16:  Mod LVH, EF 55-60%, no RWMA, Gr 1 DD, severe AS (mean 51 mmHg; peak 86 mmHg), MAC, mild LAE  b. s/p TAVR on 06/26/2015 with 26mm Edward Sapien 3 THV  . Shortness of breath dyspnea     PAST SURGICAL HISTORY:   Past Surgical History:  Procedure Laterality Date  .  CARDIAC CATHETERIZATION N/A 05/15/2015   Procedure: Right/Left Heart Cath and Coronary Angiography;  Surgeon: Tonny Bollman, MD;  Location: [facility] INVASIVE CV LAB;  Service: Cardiovascular;  Laterality: N/A;  . CHOLECYSTECTOMY    . EYE SURGERY     bilateral cataracts removed, /w IOL  . KNEE SURGERY Right 1985   arthrosopic -   . TEE WITHOUT CARDIOVERSION N/A 06/26/2015   Procedure: TRANSESOPHAGEAL ECHOCARDIOGRAM (TEE);  Surgeon: Tonny Bollman, MD;  Location: [facility] OR;  Service: Open Heart Surgery;  Laterality: N/A;  . TONSILLECTOMY    . TRANSCATHETER AORTIC VALVE REPLACEMENT, TRANSFEMORAL N/A 06/26/2015   Procedure: TRANSCATHETER AORTIC VALVE REPLACEMENT, TRANSFEMORAL;  Surgeon: Tonny Bollman, MD;  Location: [facility] OR;  Service: Open Heart Surgery;  Laterality: N/A;    SOCIAL HISTORY:   Social History  Substance Use Topics  . Smoking status: Former Smoker    Packs/day: 1.50    Years: 53.00    Types: Cigarettes    Quit date: 06/23/1999  . Smokeless tobacco: Former Neurosurgeon    Types: Snuff, Chew  . Alcohol use No    FAMILY HISTORY:   Family History  Problem Relation Age of Onset  . Stroke Mother   . Heart attack Mother 1  deceased  . Prostate cancer Father 51  . Colon cancer Neg Hx     DRUG ALLERGIES:   Allergies  Allergen Reactions  . Iohexol Hives and Swelling    Pt reports swelling, redness, hives, and blisters   . Atorvastatin     REACTION: aches  . Celecoxib     REACTION: rash  . Clinoril [Sulindac]     REACTION: rash  . Nitrofurantoin   . Spiriva Handihaler [Tiotropium Bromide Monohydrate] Other (See Comments)    Dry mouth  . Sulfa Antibiotics     Intolerant but unrecalled.     MEDICATIONS AT HOME:   Prior to Admission medications   Medication Sig Start Date End Date Taking? Authorizing Provider  albuterol (PROAIR HFA) 108 (90 Base) MCG/ACT inhaler INHALE 1-2 PUFFS INTO THE LUNGS EVERY 6 (SIX) HOURS AS NEEDED FOR WHEEZING. 08/08/16  Yes Michele Mcalpine, MD   aspirin EC 81 MG tablet Take 1 tablet (81 mg total) by mouth daily. 04/24/15  Yes Weaver, Scott T, PA-C  budesonide-formoterol (SYMBICORT) 160-4.5 MCG/ACT inhaler Inhale 2 puffs into the lungs 2 (two) times daily. 08/08/16  Yes Michele Mcalpine, MD  fluocinonide cream (LIDEX) 0.05 % Apply 1 application topically 2 (two) times daily as needed. Use sparingly. 11/20/16  Yes Joaquim Nam, MD  furosemide (LASIX) 20 MG tablet Take 1 tablet (20 mg total) by mouth daily. 05/21/16  Yes Tonny Bollman, MD  guaiFENesin (MUCINEX) 600 MG 12 hr tablet Take 600 mg by mouth 2 (two) times daily.    Yes [provider]  ibuprofen (ADVIL,MOTRIN) 200 MG tablet Take 200 mg by mouth every 6 (six) hours as needed (pain). Reported on 07/30/2015   Yes [provider]  ipratropium-albuterol (DUONEB) 0.5-2.5 (3) MG/3ML SOLN Take 3 mLs by nebulization 3 (three) times daily. 12/27/15  Yes Michele Mcalpine, MD  LORazepam (ATIVAN) 1 MG tablet TAKE 1/4 TO 1/2 TABLET BY MOUTH EVERY 6 HOURS AS NEEDED 11/28/16  Yes Joaquim Nam, MD  metoprolol tartrate (LOPRESSOR) 25 MG tablet TAKE 0.5 TABLETS (12.5 MG TOTAL) BY MOUTH 2 (TWO) TIMES DAILY. 06/10/16  Yes Tonny Bollman, MD  OXYGEN Inhale into the lungs. 4 Liters   Yes [provider]  polyethylene glycol (MIRALAX / GLYCOLAX) packet Take 17 g by mouth daily as needed (constiptation).   Yes [provider]  Respiratory Therapy Supplies (FLUTTER) DEVI Use as directed 02/23/12  Yes Clance, Maree Krabbe, MD  sodium chloride (OCEAN) 0.65 % SOLN nasal spray Place 2 sprays into both nostrils daily as needed for congestion.    Yes [provider]    REVIEW OF SYSTEMS:  Review of Systems  Constitutional: Positive for malaise/fatigue. Negative for chills, fever and weight loss.  HENT: Negative for ear pain, hearing loss and tinnitus.   Eyes: Negative for blurred vision, double vision, pain and redness.  Respiratory: Negative for cough, hemoptysis and  shortness of breath.   Cardiovascular: Negative for chest pain, palpitations, orthopnea and leg swelling.  Gastrointestinal: Negative for abdominal pain, constipation, diarrhea, nausea and vomiting.  Genitourinary: Negative for dysuria, frequency and hematuria.  Musculoskeletal: Positive for myalgias. Negative for back pain, joint pain and neck pain.  Skin:       No acne, rash, or lesions  Neurological: Negative for dizziness, tremors, focal weakness and weakness.  Endo/Heme/Allergies: Negative for polydipsia. Does not bruise/bleed easily.  Psychiatric/Behavioral: Negative for depression. The patient is not nervous/anxious and does not have insomnia.  VITAL SIGNS:   Vitals:   12/02/16 2236 12/02/16 2339 12/03/16 0000 12/03/16 0015  BP: (!) 186/85 (!) 181/82 (!) 176/91 128/70  Pulse: 83 84 83 84  Resp: (!) 30 (!) 25 (!) 29 (!) 22  Temp:      TempSrc:      SpO2: 99% 98% 98% 98%  Weight:      Height:       Wt Readings from Last 3 Encounters:  12/02/16 123.4 kg (272 lb)  11/20/16 128.7 kg (283 lb 12 oz)  11/14/16 123.2 kg (271 lb 8 oz)    PHYSICAL EXAMINATION:  Physical Exam  Vitals reviewed. Constitutional: He is oriented to person, place, and time. He appears well-developed and well-nourished. No distress.  HENT:  Head: Normocephalic and atraumatic.  Mouth/Throat: Oropharynx is clear and moist.  Eyes: Conjunctivae and EOM are normal. Pupils are equal, round, and reactive to light. No scleral icterus.  Neck: Normal range of motion. Neck supple. No JVD present. No thyromegaly present.  Cardiovascular: Normal rate, regular rhythm and intact distal pulses.  Exam reveals no gallop and no friction rub.   No murmur heard. Respiratory: Effort normal and breath sounds normal. No respiratory distress. He has no wheezes. He has no rales.  GI: Soft. Bowel sounds are normal. He exhibits no distension. There is no tenderness.  Musculoskeletal: Normal range of motion. He exhibits no  edema.  No arthritis, no gout  Lymphadenopathy:    He has no cervical adenopathy.  Neurological: He is alert and oriented to person, place, and time. No cranial nerve deficit.  No dysarthria, no aphasia  Skin: Skin is warm and dry. No rash noted. No erythema.  Psychiatric: He has a normal mood and affect. His behavior is normal. Judgment and thought content normal.    LABORATORY PANEL:   CBC  Recent Labs Lab 12/02/16 2016  WBC 9.2  HGB 13.7  HCT 40.1  PLT 203   ------------------------------------------------------------------------------------------------------------------  Chemistries   Recent Labs Lab 12/02/16 2016  NA 137  K 3.8  CL 95*  CO2 34*  GLUCOSE 101*  BUN 18  CREATININE 0.84  CALCIUM 9.4  AST 23  ALT 13*  ALKPHOS 90  BILITOT 1.6*   ------------------------------------------------------------------------------------------------------------------  Cardiac Enzymes  Recent Labs Lab 12/02/16 2016  TROPONINI <0.03   ------------------------------------------------------------------------------------------------------------------  RADIOLOGY:  Dg Chest 2 View  Result Date: 12/02/2016 CLINICAL DATA:  83 year old male with generalized pain and weakness. EXAM: CHEST  2 VIEW COMPARISON:  Chest radiograph dated 06/27/2015 FINDINGS: The lungs are clear. There is no pleural effusion or pneumothorax. The cardiac silhouette is within normal limits. Aortic valve replacement noted. There is atherosclerotic calcification of the aortic arch. No acute osseous pathology. IMPRESSION: No active cardiopulmonary disease. Electronically Signed   By: Elgie Collard M.D.   On: 12/02/2016 22:14   Ct Head Wo Contrast  Result Date: 12/02/2016 CLINICAL DATA:  Acute onset of generalized body aches and headache. Generalized weakness. Initial encounter. EXAM: CT HEAD WITHOUT CONTRAST TECHNIQUE: Contiguous axial images were obtained from the base of the skull through the vertex  without intravenous contrast. COMPARISON:  None. FINDINGS: Brain: No evidence of acute infarction, hemorrhage, hydrocephalus, extra-axial collection or mass lesion/mass effect. Prominence of the ventricles and sulci reflects mild to moderate cortical volume loss. Scattered periventricular and subcortical white matter change likely reflects small vessel ischemic microangiopathy. The brainstem and fourth ventricle are within normal limits. The basal ganglia are unremarkable in appearance. The cerebral hemispheres demonstrate grossly normal  gray-white differentiation. No mass effect or midline shift is seen. Vascular: No hyperdense vessel or unexpected calcification. Skull: There is no evidence of fracture; visualized osseous structures are unremarkable in appearance. Sinuses/Orbits: The visualized portions of the orbits are within normal limits. The paranasal sinuses and mastoid air cells are well-aerated. Other: No significant soft tissue abnormalities are seen. IMPRESSION: 1. No acute intracranial pathology seen on CT. 2. Mild to moderate cortical volume loss and scattered small vessel ischemic microangiopathy. Electronically Signed   By: Roanna Raider M.D.   On: 12/02/2016 22:19   Ct Renal Stone Study  Result Date: 12/02/2016 CLINICAL DATA:  83 year old male with weakness. EXAM: CT ABDOMEN AND PELVIS WITHOUT CONTRAST TECHNIQUE: Multidetector CT imaging of the abdomen and pelvis was performed following the standard protocol without IV contrast. COMPARISON:  Abdominal CT dated 05/22/2015 FINDINGS: Lower chest: The visualized lung bases are clear. Aortic valve replacement and calcification of the mitral annulus noted. There is no intra-abdominal free air or free fluid. Hepatobiliary: Cholecystectomy. There is fatty infiltration of the liver. No intrahepatic biliary ductal dilatation. Pancreas: Unremarkable. No pancreatic ductal dilatation or surrounding inflammatory changes. Spleen: Normal in size without focal  abnormality. Adrenals/Urinary Tract: The adrenal glands are unremarkable. There are small nonobstructing bilateral renal calculi measuring up to 3 mm in the interpolar aspect of the left kidney. There is no hydronephrosis or obstructing stone. There is mild bilateral renal parenchymal atrophy. Right renal hypodense lesions are not characterized on this noncontrast study but demonstrate fluid attenuation and represent cysts as seen on the prior CT. The largest cyst measures up to 6 cm in the upper pole of the right kidney. The visualized ureters and urinary bladder appear unremarkable. Stomach/Bowel: There is sigmoid diverticulosis without active inflammatory changes. Moderate stool noted throughout the colon. No evidence of bowel obstruction or active inflammation. Normal appendix. Vascular/Lymphatic: There is moderate aortoiliac atherosclerotic disease. There is a 4.1 cm infrarenal aortic aneurysm previously measuring 3.8 cm. A 3 cm infrarenal aortic aneurysm is also noted more superiorly. The IVC appears unremarkable. Evaluation of the vasculature is limited in the absence of intravenous contrast. No portal venous gas identified. There is no adenopathy. Reproductive: The prostate and seminal vesicles are grossly unremarkable. Other: None Musculoskeletal: Degenerative changes of the spine. No acute fracture. IMPRESSION: 1. Small nonobstructing bilateral renal calculi. No hydronephrosis or obstructing stone. 2. Fatty liver. 3. Colonic diverticulosis. No bowel obstruction or active inflammation. 4.  Aortic Atherosclerosis (ICD10-I70.0). 5. Aortic aneurysm NOS (ICD10-I71.9). Recommend followup by ultrasound in 1 year. This recommendation follows ACR consensus guidelines: White Paper of the ACR Incidental Findings Committee II on Vascular Findings. J Am Coll Radiol 2013; 10:789-794. Electronically Signed   By: Elgie Collard M.D.   On: 12/02/2016 22:29    EKG:   Orders placed or performed during the hospital  encounter of 12/02/16  . EKG 12-Lead  . EKG 12-Lead  . ED EKG  . ED EKG  . EKG 12-Lead  . EKG 12-Lead    IMPRESSION AND PLAN:  Principal Problem:   Myalgia - unclear etiology, whether viral or potentially related to his tick bite, see below. Active Problems:   Tick bite - possibly related to a tick bite illness, though that is unclear at this time. We will send blood work for recommend spotted fever and Lyme disease, we'll keep doxycycline in place for now. Unclear whether he would even have a positive test result of this point given that he likely has partially treated illness since  he has been taking half dose doxycycline for the past several days.   HYPERTENSION, BENIGN ESSENTIAL - continue home meds   COPD (chronic obstructive pulmonary disease) with emphysema (HCC) - continue home inhalers   Chronic diastolic CHF (congestive heart failure) (HCC) - continue home meds  All the records are reviewed and case discussed with ED provider. Management plans discussed with the patient and/or family.  DVT PROPHYLAXIS: SubQ lovenox  GI PROPHYLAXIS: None  ADMISSION STATUS: Observation  CODE STATUS: Full Code Status History    Date Active Date Inactive Code Status Order ID Comments User Context   06/26/2015 10:52 AM 06/28/2015  4:56 PM Full Code [id number]  Tonny Bollman, MD Inpatient   05/15/2015 10:55 AM 05/15/2015  5:17 PM Full Code [id number]  Tonny Bollman, MD Inpatient      TOTAL TIME TAKING CARE OF THIS PATIENT: 40 minutes.   Melanye Hiraldo FIELDING 12/03/2016, 12:36 AM  [facility]  Office  [phone]  CC: Primary care physician; Joaquim Nam, MD  Note:  This document was prepared using Dragon voice recognition software and may include unintentional dictation errors.

## 2016-12-03 NOTE — Clinical Social Work Note (Signed)
Clinical Social Work Assessment  Patient Details  Name: [name] [name] MRN: [id number] Date of Birth: 1934-04-01  Date of referral:  12/03/16               Reason for consult:  Facility Placement                Permission sought to share information with:  Facility Sport and exercise psychologist, Family Supports Permission granted to share information::  Yes, Verbal Permission Granted  Name::        Agency::     Relationship::     Contact Information:     Housing/Transportation Living arrangements for the past 2 months:  Single Family Home Source of Information:  Patient, Spouse Patient Interpreter Needed:  None Criminal Activity/Legal Involvement Pertinent to Current Situation/Hospitalization:  No - Comment as needed Significant Relationships:  Spouse Lives with:  Spouse Do you feel safe going back to the place where you live?  Yes Need for family participation in patient care:  Yes (Comment)  Care giving concerns:  Patient resides with his spouse at home.   Social Worker assessment / plan:  CSW informed by PT that they are recommending STR and that patient and wife are agreeable. CSW prepared for bed search. CSW went to speak with patient and wife and explained role and purpose of visit. Patient and wife explained to CSW that they believe that patient will do better at home and that they have had relatives who have gone to rehab and most of the day they lay in a bed. Patient and wife are agreeable to home health. RN CM is aware.  Employment status:  Retired Nurse, adult PT Recommendations:  [place] / Referral to community resources:     Patient/Family's Response to care:  Patient and wife expressed appreciation for CSW visit.  Patient/Family's Understanding of and Emotional Response to Diagnosis, Current Treatment, and Prognosis:  Patient expressed that he knows his limitations and wife and patient believe that they will be able to  manage at home with services.  Emotional Assessment Appearance:  Appears stated age Attitude/Demeanor/Rapport:   (pleasant cooperative) Affect (typically observed):  Calm, Appropriate, Pleasant Orientation:  Oriented to Self, Oriented to Place, Oriented to  Time, Oriented to Situation Alcohol / Substance use:  Not Applicable Psych involvement (Current and /or in the community):  No (Comment)  Discharge Needs  Concerns to be addressed:  Care Coordination Readmission within the last 30 days:  No Current discharge risk:  None Barriers to Discharge:  No Barriers Identified   Shela Leff, LCSW 12/03/2016, 11:57 AM

## 2016-12-03 NOTE — Evaluation (Signed)
Physical Therapy Evaluation Patient Details Name: [name] [name] MRN: [id number] DOB: [birth year] Today's Date: 12/03/2016   History of Present Illness  Pt is an 83 y.o.malewho presents with myalgias and weakness. Patient states he began feeling bad about 5 days ago. He noticed a tick bite about 4 days ago on his hand. He denies ever noticing any rash. He did have some mild headache initially, but states that that has resolved. He denies any other symptoms, stating that he has not had any fever, chills, diarrhea, cough, shortness of breath. He took some doxycycline that his wife had, half dose at 100 mg daily. Hospitalists were called for further evaluation.  Assessment includes: Accelerated HTN and myalgia/general weakness.    Clinical Impression  Pt presents with deficits in strength, transfers, mobility, gait, balance, and activity tolerance.  Pt required Min A and extra time and effort with bed mobility tasks.  Pt required CGA with sit to/from stand transfers from elevated EOB.  Pt CGA with amb x 15' with RW.  Very effortful ambulation with flexed trunk posture and slow cadence with slightly antalgic gait on LLE.  Pt required +2 Mod A with ascending/descending one step.  Mod verbal cues given for correct LE sequencing with LLE being weaker than the RLE and for proper sequencing with use of RW.  Pt will benefit from PT services in a SNF setting upon discharge to address above deficits in a safe environment for decreased caregiver assistance and eventual return to prior living situation.        SNF    Equipment Recommendations  None recommended by PT    Recommendations for Other Services       Precautions / Restrictions Precautions Precautions: Fall Restrictions Weight Bearing Restrictions: No      Mobility  Bed Mobility Overal bed mobility: Needs Assistance Bed Mobility: Supine to Sit;Sit to Supine     Supine to sit: Min assist Sit to supine: Min assist   General bed  mobility comments: Bed mobility required Min A with significant pt effort and increased time for tasks  Transfers Overall transfer level: Needs assistance Equipment used: Rolling walker (2 wheeled) Transfers: Sit to/from Stand Sit to Stand: Min guard         General transfer comment: From elevated EOB  Ambulation/Gait Ambulation/Gait assistance: Min guard Ambulation Distance (Feet): 15 Feet Assistive device: Rolling walker (2 wheeled) Gait Pattern/deviations: Step-to pattern;Decreased step length - right;Decreased step length - left;Trunk flexed;Antalgic   Gait velocity interpretation: Below normal speed for age/gender General Gait Details: Very effortful ambulation with flexed trunk posture and slow cadence with slightly antalgic gait on LLE.    Stairs Stairs: Yes Stairs assistance: Mod assist;+2 physical assistance Stair Management: With walker Number of Stairs: 1 General stair comments: +2 Mod A to prevent posterior LOB while ascending/descending 1 step; mod verbal cues given for sequencing with use of RW and with LLE noted to be painful and somewhat weaker than RLE  Wheelchair Mobility    Modified Rankin (Stroke Patients Only)       Balance Overall balance assessment: Needs assistance Sitting-balance support: No upper extremity supported;Feet supported Sitting balance-Leahy Scale: Good     Standing balance support: Bilateral upper extremity supported Standing balance-Leahy Scale: Fair                               Pertinent Vitals/Pain Pain Assessment: 0-10 Pain Score: 5  Pain Location: General body aches  Pain Descriptors / Indicators: Aching Pain Intervention(s): Premedicated before session;Monitored during session    Home Living Family/patient expects to be discharged to:: Private residence Living Arrangements: Spouse/significant other Available Help at Discharge: Family Type of Home: House Home Access: Stairs to enter Entrance  Stairs-Rails: None Entrance Stairs-Number of Steps: 1 Home Layout: One level Home Equipment: Environmental consultant - 2 wheels;Walker - 4 wheels;Cane - single point      Prior Function Level of Independence: Independent with assistive device(s)         Comments: Ind amb in home without AD, Mod Ind amb in community with either a SPC or rollator, no fall history.     Hand Dominance   Dominant Hand: Right    Extremity/Trunk Assessment        Lower Extremity Assessment Lower Extremity Assessment: Generalized weakness       Communication   Communication: No difficulties  Cognition Arousal/Alertness: Awake/alert Behavior During Therapy: WFL for tasks assessed/performed Overall Cognitive Status: Within Functional Limits for tasks assessed                                        General Comments      Exercises Total Joint Exercises Ankle Circles/Pumps: AROM;Both;5 reps;10 reps Quad Sets: Strengthening;Both;5 reps;10 reps Gluteal Sets: Strengthening;Both;10 reps Heel Slides: AROM;Both;5 reps Hip ABduction/ADduction: AROM;Both;5 reps Straight Leg Raises: AROM;Both;5 reps (Difficult with limited ROM on LLE) Long Arc Quad: AROM;Both;10 reps Knee Flexion: AROM;Both;10 reps   Assessment/Plan    PT Assessment Patient needs continued PT services  PT Problem List Decreased strength;Decreased activity tolerance;Decreased balance;Decreased knowledge of use of DME;Decreased mobility       PT Treatment Interventions DME instruction;Gait training;Stair training;Functional mobility training;Neuromuscular re-education;Balance training;Therapeutic exercise;Therapeutic activities;Patient/family education    PT Goals (Current goals can be found in the Care Plan section)  Acute Rehab PT Goals Patient Stated Goal: To be able to walk longer distances PT Goal Formulation: With patient Time For Goal Achievement: 12/16/16 Potential to Achieve Goals: Good    Frequency Min 2X/week    Barriers to discharge        Co-evaluation               AM-PAC PT "6 Clicks" Daily Activity  Outcome Measure Difficulty turning over in bed (including adjusting bedclothes, sheets and blankets)?: Total Difficulty moving from lying on back to sitting on the side of the bed? : Total Difficulty sitting down on and standing up from a chair with arms (e.g., wheelchair, bedside commode, etc,.)?: Total Help needed moving to and from a bed to chair (including a wheelchair)?: A Little Help needed walking in hospital room?: A Little Help needed climbing 3-5 steps with a railing? : A Lot 6 Click Score: 11    End of Session Equipment Utilized During Treatment: Gait belt;Oxygen Activity Tolerance: Patient limited by fatigue Patient left: in bed;with bed alarm set;with call bell/phone within reach;with family/visitor present Nurse Communication: Mobility status PT Visit Diagnosis: Difficulty in walking, not elsewhere classified (R26.2);Muscle weakness (generalized) (M62.81)    Time: 3976-7341 PT Time Calculation (min) (ACUTE ONLY): 38 min   Charges:   PT Evaluation $PT Eval Low Complexity: 1 Procedure PT Treatments $Gait Training: 8-22 mins $Therapeutic Exercise: 8-22 mins   PT G Codes:   PT G-Codes **NOT FOR INPATIENT CLASS** Functional Assessment Tool Used: AM-PAC 6 Clicks Basic Mobility Functional Limitation: Mobility: Walking and moving around Mobility:  Walking and Moving Around Current Status [phone]): At least 60 percent but less than 80 percent impaired, limited or restricted Mobility: Walking and Moving Around Goal Status [phone]): At least 1 percent but less than 20 percent impaired, limited or restricted    D. Royetta Asal PT, DPT 12/03/16, 12:24 PM

## 2016-12-03 NOTE — Discharge Summary (Addendum)
[facility] [place] [facility]   PATIENT NAME: [name] [name]    MR#:  [id number]  DATE OF BIRTH:  1933/09/15  DATE OF ADMISSION:  12/02/2016 ADMITTING PHYSICIAN: Oralia Manis, MD  DATE OF DISCHARGE: 12/03/2016  PRIMARY CARE PHYSICIAN: Joaquim Nam, MD    ADMISSION DIAGNOSIS:  Weakness [R53.1] Flu-like symptoms [R68.89]  DISCHARGE DIAGNOSIS:  Principal Problem:   Myalgia Active Problems:   HYPERTENSION, BENIGN ESSENTIAL   COPD (chronic obstructive pulmonary disease) with emphysema (HCC)   Chronic diastolic CHF (congestive heart failure) (HCC)   Tick bite   SECONDARY DIAGNOSIS:   Past Medical History:  Diagnosis Date  . AAA (abdominal aortic aneurysm) (HCC)    3.8 cm CTA 05/2015  . Anxiety   . COPD (chronic obstructive pulmonary disease) (HCC)    on home O2 4LPM  . Diverticulosis   . Former smoker    quit 2001  . Hemorrhoids   . Kidney stones    passed spontaneously  . OSA on CPAP    last study- in the home possibility- 2015, pt. unsure   . Prediabetes    Hgb A1C 5.8 in Jan 2017  . PUD (peptic ulcer disease)   . Right knee DJD 10/31/2011   Severe  . S/P TAVR (transcatheter aortic valve replacement) 06/26/2015   26 mm Edwards Sapien 3 transcatheter heart valve placed via percutaneous right transfemoral approach  . Severe aortic stenosis    a. Echo 11/16:  Mod LVH, EF 55-60%, no RWMA, Gr 1 DD, severe AS (mean 51 mmHg; peak 86 mmHg), MAC, mild LAE  b. s/p TAVR on 06/26/2015 with 26mm Edward Sapien 3 THV  . Shortness of breath dyspnea     HOSPITAL COURSE:  83 y.o. male who presents with myalgias and weakness   1. Accelerated HTN: I believe his symptoms may in part be due to elevated BP. He will continue Metoprolol and I added Norvasc. He was advised tocheck is CP daily and follow up with his PCP  2. Myalgia/generalized weakness; He was evaluated by PT. He had no focal defecits. He had a tick bit a week ago. RMSF titer pending He is  empirically discharged on Doxycycline for a total of 10 days.  3. COPD: without exacerbation with chronic hypoxic resp failure: continue O2 and inhlaers  4.OSA on CPAP  5. Hypokalemia: repleted   DISCHARGE CONDITIONS AND DIET:  Stable Cardiac diet  CONSULTS OBTAINED:    DRUG ALLERGIES:   Allergies  Allergen Reactions  . Iohexol Hives and Swelling    Pt reports swelling, redness, hives, and blisters   . Atorvastatin     REACTION: aches  . Celecoxib     REACTION: rash  . Clinoril [Sulindac]     REACTION: rash  . Nitrofurantoin   . Spiriva Handihaler [Tiotropium Bromide Monohydrate] Other (See Comments)    Dry mouth  . Sulfa Antibiotics     Intolerant but unrecalled.     DISCHARGE MEDICATIONS:   Current Discharge Medication List    START taking these medications   Details  amLODipine (NORVASC) 5 MG tablet Take 1 tablet (5 mg total) by mouth daily. Qty: 30 tablet, Refills: 0    doxycycline (VIBRA-TABS) 100 MG tablet Take 1 tablet (100 mg total) by mouth 2 (two) times daily. Qty: 18 tablet, Refills: 0      CONTINUE these medications which have NOT CHANGED   Details  albuterol (PROAIR HFA) 108 (90 Base) MCG/ACT inhaler INHALE 1-2 PUFFS INTO  THE LUNGS EVERY 6 (SIX) HOURS AS NEEDED FOR WHEEZING. Qty: 8.5 each, Refills: 11    aspirin EC 81 MG tablet Take 1 tablet (81 mg total) by mouth daily.    budesonide-formoterol (SYMBICORT) 160-4.5 MCG/ACT inhaler Inhale 2 puffs into the lungs 2 (two) times daily. Qty: 1 Inhaler, Refills: 11    fluocinonide cream (LIDEX) 0.05 % Apply 1 application topically 2 (two) times daily as needed. Use sparingly. Qty: 60 g, Refills: 1    furosemide (LASIX) 20 MG tablet Take 1 tablet (20 mg total) by mouth daily. Qty: 30 tablet, Refills: 10    guaiFENesin (MUCINEX) 600 MG 12 hr tablet Take 600 mg by mouth 2 (two) times daily.    Associated Diagnoses: Other emphysema (HCC); Chronic respiratory failure, unspecified whether with hypoxia  or hypercapnia (HCC); OSA (obstructive sleep apnea)    ibuprofen (ADVIL,MOTRIN) 200 MG tablet Take 200 mg by mouth every 6 (six) hours as needed (pain). Reported on 07/30/2015    ipratropium-albuterol (DUONEB) 0.5-2.5 (3) MG/3ML SOLN Take 3 mLs by nebulization 3 (three) times daily. Qty: 360 mL, Refills: 11    LORazepam (ATIVAN) 1 MG tablet TAKE 1/4 TO 1/2 TABLET BY MOUTH EVERY 6 HOURS AS NEEDED Qty: 30 tablet, Refills: 1    metoprolol tartrate (LOPRESSOR) 25 MG tablet TAKE 0.5 TABLETS (12.5 MG TOTAL) BY MOUTH 2 (TWO) TIMES DAILY. Qty: 90 tablet, Refills: 3    OXYGEN Inhale into the lungs. 4 Liters    polyethylene glycol (MIRALAX / GLYCOLAX) packet Take 17 g by mouth daily as needed (constiptation).    Respiratory Therapy Supplies (FLUTTER) DEVI Use as directed Qty: 1 each, Refills: 0    sodium chloride (OCEAN) 0.65 % SOLN nasal spray Place 2 sprays into both nostrils daily as needed for congestion.           Today   CHIEF COMPLAINT:  Doing better this am Wants to go home   VITAL SIGNS:  Blood pressure (!) 176/73, pulse 72, temperature 98.1 F (36.7 C), temperature source Oral, resp. rate 20, height 5\' 10"  (1.778 m), weight 123.4 kg (272 lb), SpO2 97 %.   REVIEW OF SYSTEMS:  Review of Systems  Constitutional: Negative for chills, fever and malaise/fatigue.  HENT: Negative.  Negative for ear discharge, ear pain, hearing loss, nosebleeds and sore throat.   Eyes: Negative.  Negative for blurred vision and pain.  Respiratory: Negative.  Negative for cough, hemoptysis, shortness of breath and wheezing.   Cardiovascular: Negative.  Negative for chest pain, palpitations and leg swelling.  Gastrointestinal: Negative.  Negative for abdominal pain, blood in stool, diarrhea, nausea and vomiting.  Genitourinary: Negative.  Negative for dysuria.  Musculoskeletal: Negative.  Negative for back pain.  Skin: Negative.   Neurological: Positive for weakness. Negative for dizziness,  tremors, speech change, focal weakness, seizures and headaches.  Endo/Heme/Allergies: Negative.  Does not bruise/bleed easily.  Psychiatric/Behavioral: Negative.  Negative for depression, hallucinations and suicidal ideas.     PHYSICAL EXAMINATION:  GENERAL:  83 y.o.-year-old patient lying in the bed with no acute distress.  NECK:  Supple, no jugular venous distention. No thyroid enlargement, no tenderness.  LUNGS: Normal breath sounds bilaterally, no wheezing, rales,rhonchi  No use of accessory muscles of respiration.  CARDIOVASCULAR: S1, S2 normal. No murmurs, rubs, or gallops.  ABDOMEN: Soft, non-tender, non-distended. Bowel sounds present. No organomegaly or mass.  EXTREMITIES: No pedal edema, cyanosis, or clubbing.  PSYCHIATRIC: The patient is alert and oriented x 3.  SKIN: No obvious  rash, lesion, or ulcer.   DATA REVIEW:   CBC  Recent Labs Lab 12/03/16 0435  WBC 8.4  HGB 12.5*  HCT 36.3*  PLT 189    Chemistries   Recent Labs Lab 12/02/16 2016 12/03/16 0435  NA 137 141  K 3.8 3.4*  CL 95* 97*  CO2 34* 36*  GLUCOSE 101* 159*  BUN 18 17  CREATININE 0.84 0.75  CALCIUM 9.4 8.9  AST 23  --   ALT 13*  --   ALKPHOS 90  --   BILITOT 1.6*  --     Cardiac Enzymes  Recent Labs Lab 12/02/16 2016  TROPONINI <0.03    Microbiology Results  @[email or web address] @  RADIOLOGY:  Dg Chest 2 View  Result Date: 12/02/2016 CLINICAL DATA:  83 year old male with generalized pain and weakness. EXAM: CHEST  2 VIEW COMPARISON:  Chest radiograph dated 06/27/2015 FINDINGS: The lungs are clear. There is no pleural effusion or pneumothorax. The cardiac silhouette is within normal limits. Aortic valve replacement noted. There is atherosclerotic calcification of the aortic arch. No acute osseous pathology. IMPRESSION: No active cardiopulmonary disease. Electronically Signed   By: Elgie Collard M.D.   On: 12/02/2016 22:14   Ct Head Wo Contrast  Result Date: 12/02/2016 CLINICAL DATA:   Acute onset of generalized body aches and headache. Generalized weakness. Initial encounter. EXAM: CT HEAD WITHOUT CONTRAST TECHNIQUE: Contiguous axial images were obtained from the base of the skull through the vertex without intravenous contrast. COMPARISON:  None. FINDINGS: Brain: No evidence of acute infarction, hemorrhage, hydrocephalus, extra-axial collection or mass lesion/mass effect. Prominence of the ventricles and sulci reflects mild to moderate cortical volume loss. Scattered periventricular and subcortical white matter change likely reflects small vessel ischemic microangiopathy. The brainstem and fourth ventricle are within normal limits. The basal ganglia are unremarkable in appearance. The cerebral hemispheres demonstrate grossly normal gray-white differentiation. No mass effect or midline shift is seen. Vascular: No hyperdense vessel or unexpected calcification. Skull: There is no evidence of fracture; visualized osseous structures are unremarkable in appearance. Sinuses/Orbits: The visualized portions of the orbits are within normal limits. The paranasal sinuses and mastoid air cells are well-aerated. Other: No significant soft tissue abnormalities are seen. IMPRESSION: 1. No acute intracranial pathology seen on CT. 2. Mild to moderate cortical volume loss and scattered small vessel ischemic microangiopathy. Electronically Signed   By: Roanna Raider M.D.   On: 12/02/2016 22:19   Ct Renal Stone Study  Result Date: 12/02/2016 CLINICAL DATA:  83 year old male with weakness. EXAM: CT ABDOMEN AND PELVIS WITHOUT CONTRAST TECHNIQUE: Multidetector CT imaging of the abdomen and pelvis was performed following the standard protocol without IV contrast. COMPARISON:  Abdominal CT dated 05/22/2015 FINDINGS: Lower chest: The visualized lung bases are clear. Aortic valve replacement and calcification of the mitral annulus noted. There is no intra-abdominal free air or free fluid. Hepatobiliary:  Cholecystectomy. There is fatty infiltration of the liver. No intrahepatic biliary ductal dilatation. Pancreas: Unremarkable. No pancreatic ductal dilatation or surrounding inflammatory changes. Spleen: Normal in size without focal abnormality. Adrenals/Urinary Tract: The adrenal glands are unremarkable. There are small nonobstructing bilateral renal calculi measuring up to 3 mm in the interpolar aspect of the left kidney. There is no hydronephrosis or obstructing stone. There is mild bilateral renal parenchymal atrophy. Right renal hypodense lesions are not characterized on this noncontrast study but demonstrate fluid attenuation and represent cysts as seen on the prior CT. The largest cyst measures up to 6 cm in the upper pole  of the right kidney. The visualized ureters and urinary bladder appear unremarkable. Stomach/Bowel: There is sigmoid diverticulosis without active inflammatory changes. Moderate stool noted throughout the colon. No evidence of bowel obstruction or active inflammation. Normal appendix. Vascular/Lymphatic: There is moderate aortoiliac atherosclerotic disease. There is a 4.1 cm infrarenal aortic aneurysm previously measuring 3.8 cm. A 3 cm infrarenal aortic aneurysm is also noted more superiorly. The IVC appears unremarkable. Evaluation of the vasculature is limited in the absence of intravenous contrast. No portal venous gas identified. There is no adenopathy. Reproductive: The prostate and seminal vesicles are grossly unremarkable. Other: None Musculoskeletal: Degenerative changes of the spine. No acute fracture. IMPRESSION: 1. Small nonobstructing bilateral renal calculi. No hydronephrosis or obstructing stone. 2. Fatty liver. 3. Colonic diverticulosis. No bowel obstruction or active inflammation. 4.  Aortic Atherosclerosis (ICD10-I70.0). 5. Aortic aneurysm NOS (ICD10-I71.9). Recommend followup by ultrasound in 1 year. This recommendation follows ACR consensus guidelines: White Paper of the  ACR Incidental Findings Committee II on Vascular Findings. J Am Coll Radiol 2013; 10:789-794. Electronically Signed   By: Elgie Collard M.D.   On: 12/02/2016 22:29      Current Discharge Medication List    START taking these medications   Details  amLODipine (NORVASC) 5 MG tablet Take 1 tablet (5 mg total) by mouth daily. Qty: 30 tablet, Refills: 0    doxycycline (VIBRA-TABS) 100 MG tablet Take 1 tablet (100 mg total) by mouth 2 (two) times daily. Qty: 18 tablet, Refills: 0      CONTINUE these medications which have NOT CHANGED   Details  albuterol (PROAIR HFA) 108 (90 Base) MCG/ACT inhaler INHALE 1-2 PUFFS INTO THE LUNGS EVERY 6 (SIX) HOURS AS NEEDED FOR WHEEZING. Qty: 8.5 each, Refills: 11    aspirin EC 81 MG tablet Take 1 tablet (81 mg total) by mouth daily.    budesonide-formoterol (SYMBICORT) 160-4.5 MCG/ACT inhaler Inhale 2 puffs into the lungs 2 (two) times daily. Qty: 1 Inhaler, Refills: 11    fluocinonide cream (LIDEX) 0.05 % Apply 1 application topically 2 (two) times daily as needed. Use sparingly. Qty: 60 g, Refills: 1    furosemide (LASIX) 20 MG tablet Take 1 tablet (20 mg total) by mouth daily. Qty: 30 tablet, Refills: 10    guaiFENesin (MUCINEX) 600 MG 12 hr tablet Take 600 mg by mouth 2 (two) times daily.    Associated Diagnoses: Other emphysema (HCC); Chronic respiratory failure, unspecified whether with hypoxia or hypercapnia (HCC); OSA (obstructive sleep apnea)    ibuprofen (ADVIL,MOTRIN) 200 MG tablet Take 200 mg by mouth every 6 (six) hours as needed (pain). Reported on 07/30/2015    ipratropium-albuterol (DUONEB) 0.5-2.5 (3) MG/3ML SOLN Take 3 mLs by nebulization 3 (three) times daily. Qty: 360 mL, Refills: 11    LORazepam (ATIVAN) 1 MG tablet TAKE 1/4 TO 1/2 TABLET BY MOUTH EVERY 6 HOURS AS NEEDED Qty: 30 tablet, Refills: 1    metoprolol tartrate (LOPRESSOR) 25 MG tablet TAKE 0.5 TABLETS (12.5 MG TOTAL) BY MOUTH 2 (TWO) TIMES DAILY. Qty: 90 tablet,  Refills: 3    OXYGEN Inhale into the lungs. 4 Liters    polyethylene glycol (MIRALAX / GLYCOLAX) packet Take 17 g by mouth daily as needed (constiptation).    Respiratory Therapy Supplies (FLUTTER) DEVI Use as directed Qty: 1 each, Refills: 0    sodium chloride (OCEAN) 0.65 % SOLN nasal spray Place 2 sprays into both nostrils daily as needed for congestion.  Management plans discussed with the patient and he is in agreement. Stable for discharge   Patient should follow up with pcp  CODE STATUS:     Code Status Orders        Start     Ordered   12/03/16 0119  Full code  Continuous     12/03/16 0118    Code Status History    Date Active Date Inactive Code Status Order ID Comments User Context   06/26/2015 10:52 AM 06/28/2015  4:56 PM Full Code [id number]  Tonny Bollman, MD Inpatient   05/15/2015 10:55 AM 05/15/2015  5:17 PM Full Code [id number]  Tonny Bollman, MD Inpatient      TOTAL TIME TAKING CARE OF THIS PATIENT: 37 minutes.    Note: This dictation was prepared with Dragon dictation along with smaller phrase technology. Any transcriptional errors that result from this process are unintentional.  Zohair Epp M.D on 12/03/2016 at 11:39 AM  Between 7am to 6pm - Pager - [phone] After 6pm go to [email or web address] - Social research officer, government  [facility]  Office  [phone]  CC: Primary care physician; Joaquim Nam, MD

## 2016-12-03 NOTE — NC FL2 (Signed)
[place] LEVEL OF CARE SCREENING TOOL     IDENTIFICATION  Patient Name: [name] [name] Birthdate: 1933/09/10 Sex: male Admission Date (Current Location): 12/02/2016  [place] and [place] Number:  Engineering geologist and Address:  [facility], [address] [place], [place] [address]      Provider Number: [id number]  Attending Physician Name and Address:  Bettey Costa, MD  Relative Name and Phone Number:       Current Level of Care: Hospital Recommended Level of Care: [place] Prior Approval Number:    Date Approved/Denied:   PASRR Number:    Discharge Plan: SNF    Current Diagnoses: Patient Active Problem List   Diagnosis Date Noted  . Myalgia 12/03/2016  . Tick bite 12/03/2016  . Vitamin D deficiency 11/21/2016  . Flank pain 11/21/2016  . Chronic diastolic CHF (congestive heart failure) ([place]) 07/13/2016  . AAA (abdominal aortic aneurysm) without rupture ([place]) 07/13/2016  . COPD mixed type ([place]) 12/27/2015  . Fecal urgency 09/14/2015  . Dark urine 09/14/2015  . S/P TAVR (transcatheter aortic valve replacement) 06/26/2015  . Severe aortic stenosis 05/15/2015  . Medicare annual wellness visit, subsequent 05/21/2014  . Advance care planning 05/21/2014  . Skin irritation 12/20/2013  . Risk for falls 12/20/2013  . Rib pain on right side 01/29/2013  . Vertigo 07/01/2012  . Right knee DJD 10/31/2011  . Knee pain 10/23/2011  . OSA (obstructive sleep apnea) 07/31/2009  . Chronic respiratory failure, unspecified whether with hypoxia or hypercapnia ([place]) 08/03/2008  . Morbid obesity ([place]) 02/22/2008  . POLYP, COLON 08/27/2007  . Anxiety state 08/27/2007  . COPD (chronic obstructive pulmonary disease) with emphysema ([place]) 08/27/2007  . INTERNAL HEMORRHOIDS 03/23/2007  . DIVERTICULOSIS, COLON 03/23/2007  . SYMPTOM, PAIN, ABDOMINAL, GENERALIZED 02/18/2007  . HYPERGLYCEMIA 02/18/2007  . HYPERTENSION, BENIGN ESSENTIAL  02/10/2007  . NEOPLASM, SKIN, UNCERTAIN BEHAVIOR 91/47/8295  . PEPTIC ULCER DISEASE WITH H-PYLORI  TX'D 11/19/2006  . NEPHROLITHIASIS 11/19/2006  . BACK PAIN, CHRONIC 11/19/2006  . BPH (benign prostatic hyperplasia) 11/19/2006    Orientation RESPIRATION BLADDER Height & Weight     Self, Time, Situation, Place  O2, Normal (4 liters) Continent Weight: 272 lb (123.4 kg) Height:  5\' 10"  (177.8 cm)  BEHAVIORAL SYMPTOMS/MOOD NEUROLOGICAL BOWEL NUTRITION STATUS   (none)  (none) Continent Diet (heart healthy)  AMBULATORY STATUS COMMUNICATION OF NEEDS Skin   Extensive Assist Verbally                         Personal Care Assistance Level of Assistance  Bathing, Dressing Bathing Assistance: Limited assistance   Dressing Assistance: Limited assistance     Functional Limitations Info   (no issues)          SPECIAL CARE FACTORS FREQUENCY  PT (By licensed PT)                    Contractures Contractures Info: Not present    Additional Factors Info  Code Status, Allergies Code Status Info: full Allergies Info: iohexol, atorvastatin, celecoxib, clinoril, nitrofuratan, spiriva; sulf abx           Current Medications (12/03/2016):  This is the current hospital active medication list Current Facility-Administered Medications  Medication Dose Route Frequency Provider Last Rate Last Dose  . acetaminophen (TYLENOL) tablet 650 mg  650 mg Oral Q6H PRN Lance Coon, MD   650 mg at 12/03/16 6213   Or  .  acetaminophen (TYLENOL) suppository 650 mg  650 mg Rectal Q6H PRN Lance Coon, MD      . amLODipine (NORVASC) tablet 5 mg  5 mg Oral Daily Bettey Costa, MD   5 mg at 12/03/16 5537  . aspirin EC tablet 81 mg  81 mg Oral Daily Lance Coon, MD   81 mg at 12/03/16 4827  . doxycycline (VIBRA-TABS) tablet 100 mg  100 mg Oral Q12H Lance Coon, MD   100 mg at 12/03/16 0786  . enoxaparin (LOVENOX) injection 40 mg  40 mg Subcutaneous Q24H Lance Coon, MD   40 mg at 12/03/16 7544   . fentaNYL (SUBLIMAZE) injection 50 mcg  50 mcg Intravenous Once Schuyler Amor, MD   Stopped at 12/02/16 2233  . furosemide (LASIX) tablet 20 mg  20 mg Oral Daily Lance Coon, MD   20 mg at 12/03/16 0939  . ipratropium-albuterol (DUONEB) 0.5-2.5 (3) MG/3ML nebulizer solution 3 mL  3 mL Nebulization TID Lance Coon, MD   3 mL at 12/03/16 0736  . LORazepam (ATIVAN) tablet 0.5 mg  0.5 mg Oral Q6H PRN Lance Coon, MD      . metoprolol tartrate (LOPRESSOR) tablet 12.5 mg  12.5 mg Oral BID Lance Coon, MD   12.5 mg at 12/03/16 9201  . mometasone-formoterol (DULERA) 200-5 MCG/ACT inhaler 2 puff  2 puff Inhalation BID Lance Coon, MD   2 puff at 12/03/16 [phone]  . ondansetron (ZOFRAN) tablet 4 mg  4 mg Oral Q6H PRN Lance Coon, MD       Or  . ondansetron [facility]) injection 4 mg  4 mg Intravenous Q6H PRN Lance Coon, MD         Discharge Medications: Please see discharge summary for a list of discharge medications.  Relevant Imaging Results:  Relevant Lab Results:   Additional Information ss: [id number]  Shela Leff, LCSW

## 2016-12-04 LAB — URINE CULTURE: Culture: NO GROWTH

## 2016-12-04 LAB — ROCKY MTN SPOTTED FVR ABS PNL(IGG+IGM)
RMSF IGG: NEGATIVE
RMSF IgM: 0.25 index (ref 0.00–0.89)

## 2016-12-04 LAB — LYME DISEASE DNA BY PCR(BORRELIA BURG): Lyme Disease(B.burgdorferi)PCR: NEGATIVE

## 2016-12-05 ENCOUNTER — Ambulatory Visit (INDEPENDENT_AMBULATORY_CARE_PROVIDER_SITE_OTHER): Payer: PPO | Admitting: Family Medicine

## 2016-12-05 ENCOUNTER — Encounter: Payer: Self-pay | Admitting: Family Medicine

## 2016-12-05 VITALS — BP 186/90 | HR 107 | Temp 98.6°F | Wt 280.8 lb

## 2016-12-05 DIAGNOSIS — I714 Abdominal aortic aneurysm, without rupture, unspecified: Secondary | ICD-10-CM

## 2016-12-05 DIAGNOSIS — M545 Low back pain: Secondary | ICD-10-CM

## 2016-12-05 DIAGNOSIS — M791 Myalgia, unspecified site: Secondary | ICD-10-CM

## 2016-12-05 DIAGNOSIS — I10 Essential (primary) hypertension: Secondary | ICD-10-CM | POA: Diagnosis not present

## 2016-12-05 MED ORDER — AMLODIPINE BESYLATE 5 MG PO TABS: 5.0000 mg | ORAL_TABLET | Freq: Two times a day (BID) | ORAL | Status: DC

## 2016-12-05 NOTE — Patient Instructions (Signed)
[name] [name] will call about your referral.  Cut back on salt- nutrition may be able to help with that.  Increase the amlodipine to 5mg  TWICE a day.  Keep taking metoprolol as is.  Update me about your BP next week.  I presume that it is best to finish the doxycycline in the meantime.  Don't take left over antibiotics.  Use heat or ice on your back and see if that helps.  If you pass any blood in the meantime, then let us know.  Take care.  Glad to see you.

## 2016-12-05 NOTE — Progress Notes (Signed)
DATE OF ADMISSION:  12/02/2016    ADMITTING PHYSICIAN: [name] Coon, MD  DATE OF DISCHARGE: 12/03/2016  PRIMARY CARE PHYSICIAN: [name] Ghent, MD    ADMISSION DIAGNOSIS:  Weakness [R53.1] Flu-like symptoms [R68.89]  DISCHARGE DIAGNOSIS:  Principal Problem:   Myalgia Active Problems:   HYPERTENSION, BENIGN ESSENTIAL   COPD (chronic obstructive pulmonary disease) with emphysema (HCC)   Chronic diastolic CHF (congestive heart failure) (HCC)   Tick bite   SECONDARY DIAGNOSIS:       Past Medical History:  Diagnosis Date  . AAA (abdominal aortic aneurysm) (HCC)    3.8 cm CTA 05/2015  . Anxiety   . COPD (chronic obstructive pulmonary disease) (HCC)    on home O2 4LPM  . Diverticulosis   . Former smoker    quit 2001  . Hemorrhoids   . Kidney stones    passed spontaneously  . OSA on CPAP    last study- in the home possibility- 2015, pt. unsure   . Prediabetes    Hgb A1C 5.8 in Jan 2017  . PUD (peptic ulcer disease)   . Right knee DJD 10/31/2011   Severe  . S/P TAVR (transcatheter aortic valve replacement) 06/26/2015   26 mm Edwards Sapien 3 transcatheter heart valve placed via percutaneous right transfemoral approach  . Severe aortic stenosis    a. Echo 11/16:  Mod LVH, EF 55-60%, no RWMA, Gr 1 DD, severe AS (mean 51 mmHg; peak 86 mmHg), MAC, mild LAE  b. s/p TAVR on 06/26/2015 with 65m Edward Sapien 3 THV  . Shortness of breath dyspnea     HOSPITAL COURSE:  83y.o.malewho presents with myalgias and weakness   1. Accelerated HTN: I believe his symptoms may in part be due to elevated BP. He will continue Metoprolol and I added Norvasc. He was advised tocheck is BP daily and follow up with his PCP  2. Myalgia/generalized weakness; He was evaluated by PT. He had no focal defecits. He had a tick bit a week ago. RMSF titer pending He is empirically discharged on Doxycycline for a total of 10 days.  3. COPD: without exacerbation with  chronic hypoxic resp failure: continue O2 and inhlaers  4.OSA on CPAP  5. Hypokalemia: repleted  =============================================  Multiple issues to discuss. Hospital follow-up. Per report of the patient and his wife, he had been given some doxycycline by his wife. This was in response to previous tick bite. His wife has a history of hoarding antibiotics from previous prescriptions when she does not take the medicine completely. She then uses the medication at her own discretion at some point in the future. I have talked with her about this multiple times, that this is completely incorrect and hazardous. In this particular case, she did not take all of her previous doxycycline prescription, she gave it to him, he took the medicine, without consulting anyone.  I don't how much this would influence his tick associated labs that were drawn with this illness. Discussed. I explained to him that because of interaction it was much more difficult to care for them and we may not get reliable lab results in the meantime. I told them that they absolutely had made multiple mistakes with this episode and that if she had followed her previous directions that she had been given multiple times it may have improved his situation.  In the meantime, he had some skin changes on the L forearm, likely a bruise at the tick bite site and not a  rash per se.  On doxy now.  See above.  No fevers.  He doesn't have any focal deficits. He feels some better than he did when he presented to the hospital but not completely back to baseline.  HTN.  High salt diet likely.  Discussed with patient. He doesn't know how much salt he gets in his diet. He does not specifically try to avoid salt. He did not recall the names of his blood pressure medications. We give patients a copy of their medicines with every office visit and we go over the medications at every office visit. He did not bring any of his medications to the  office visit today. I advised him to do that, same as always.  CT results d/w pt.   1. Small nonobstructing bilateral renal calculi. No hydronephrosis or obstructing stone. 2. Fatty liver.  3. Colonic diverticulosis. No bowel obstruction or active inflammation. 4.  Aortic Atherosclerosis (ICD10-I70.0). 5. Aortic aneurysm NOS (ICD10-I71.9). Recommend followup by ultrasound in 1 year. This recommendation follows ACR consensus guidelines: White Paper of the ACR Incidental Findings Committee II on Vascular Findings. J Am Coll Radiol 2013; 10:789-794.  AAA was an incidental finding on the CT, prev noted. Discussed with patient.  He has some occasional back pain but no tearing, pain. He points to the paraspinal muscles in the lower back which occasionally headache. He previously treated this to renal stones but he does not have any blood in his urine and he has intermittent pain. Discussed with patient that this would be atypical for pain related to a kidney stone. More likely to be musculoskeletal i.e. strained muscle.  PMH and SH reviewed  ROS: Per HPI unless specifically indicated in ROS section   Meds, vitals, and allergies reviewed.   GEN: nad, alert and oriented, on O2 at baseline.  HEENT: mucous membranes moist NECK: supple w/o LA CV: rrr.  PULM: ctab, no inc wob ABD: soft, +bs EXT: no edema SKIN: no acute rash, senile purpura noted on the extensor side of the bilateral arms but no acute rash.

## 2016-12-07 LAB — CULTURE, BLOOD (ROUTINE X 2)
CULTURE: NO GROWTH
CULTURE: NO GROWTH
Special Requests: ADEQUATE
Special Requests: ADEQUATE

## 2016-12-07 NOTE — Assessment & Plan Note (Signed)
Still unclear source. He may have had a benign viral infection. At this point, it may make sense for him to finish the doxycycline. There is really no way to tell for certain because of what he had done with the doxycycline at home.  Discussed at length. See above. Encouraged compliance. He is some better than at discharge and clearly improved from admission. Okay for outpatient follow-up.

## 2016-12-07 NOTE — Assessment & Plan Note (Signed)
Likely musculoskeletal, not related to kidney stones. Discussed with patient. See after visit summary.

## 2016-12-07 NOTE — Assessment & Plan Note (Signed)
Recommend followup by ultrasound in 1 year.  D/w pt. anatomy and imaging discussed with patient. Goal is better blood pressure control.

## 2016-12-07 NOTE — Assessment & Plan Note (Signed)
Refer to nutrition for salt restriction diet. Increase amlodipine to 10 mg a day. Continue metoprolol. Written and verbal instructions given to patient and discussed thoroughly. He understood his medicines by the time he left the office visit. Rationale for blood pressure control discussed with patient. >40 minutes spent in face to face time with patient, >50% spent in counselling or coordination of care.

## 2016-12-08 DIAGNOSIS — H35371 Puckering of macula, right eye: Secondary | ICD-10-CM | POA: Diagnosis not present

## 2016-12-09 DIAGNOSIS — J449 Chronic obstructive pulmonary disease, unspecified: Secondary | ICD-10-CM | POA: Diagnosis not present

## 2016-12-10 ENCOUNTER — Ambulatory Visit: Payer: PPO | Admitting: Family Medicine

## 2016-12-22 DIAGNOSIS — J449 Chronic obstructive pulmonary disease, unspecified: Secondary | ICD-10-CM | POA: Diagnosis not present

## 2017-01-02 ENCOUNTER — Ambulatory Visit: Payer: PPO | Admitting: Dietician

## 2017-01-07 ENCOUNTER — Telehealth: Payer: Self-pay | Admitting: Family Medicine

## 2017-01-07 NOTE — Telephone Encounter (Signed)
Patient Name: OLUWANIFEMI SUSMAN  DOB: 10-02-1933    Initial Comment Caller states her husband cut his arm 2 days ago and yesterday his arm became swollen and red. He now has a fever of 99.    Nurse Assessment  Nurse: Arthor Captain, RN, Margaret Date/Time (Eastern Time): 01/07/2017 4:40:33 PM  Confirm and document reason for call. If symptomatic, describe symptoms. ---Caller states that he cut his right arm two days ago. Yesterday it became swollen and red. He is running a fever of 99.0 Oral.  Does the patient have any new or worsening symptoms? ---Yes  Will a triage be completed? ---Yes  Related visit to physician within the last 2 weeks? ---No  Does the PT have any chronic conditions? (i.e. diabetes, asthma, etc.) ---Yes  List chronic conditions. ---Heart disease, COPD, Kidney problems  Is this a behavioral health or substance abuse call? ---No     Guidelines    Guideline Title Affirmed Question Affirmed Notes  Cuts and Lacerations [1] Looks infected AND [2] large red area (>2 inches or 5 cm) or streak    Final Disposition User   See Physician within 4 Hours (or PCP triage) Cockrum, RN, Margaret    Referrals  GO TO FACILITY UNDECIDED   Disagree/Comply: Comply

## 2017-01-08 ENCOUNTER — Encounter: Payer: Self-pay | Admitting: Primary Care

## 2017-01-08 ENCOUNTER — Ambulatory Visit (INDEPENDENT_AMBULATORY_CARE_PROVIDER_SITE_OTHER): Payer: PPO | Admitting: Primary Care

## 2017-01-08 VITALS — BP 150/80 | HR 80 | Temp 98.3°F | Wt 274.8 lb

## 2017-01-08 DIAGNOSIS — J449 Chronic obstructive pulmonary disease, unspecified: Secondary | ICD-10-CM | POA: Diagnosis not present

## 2017-01-08 DIAGNOSIS — Z23 Encounter for immunization: Secondary | ICD-10-CM

## 2017-01-08 DIAGNOSIS — S41111A Laceration without foreign body of right upper arm, initial encounter: Secondary | ICD-10-CM

## 2017-01-08 DIAGNOSIS — L03113 Cellulitis of right upper limb: Secondary | ICD-10-CM

## 2017-01-08 MED ORDER — CEPHALEXIN 500 MG PO CAPS: 500.0000 mg | ORAL_CAPSULE | Freq: Four times a day (QID) | ORAL | 0 refills | Status: DC

## 2017-01-08 NOTE — Progress Notes (Signed)
Subjective:    Patient ID: [name] [name], male    DOB: [birth year], 83 y.o.   MRN: [id number]  HPI  Mr. Albro is an 83 year old male who presents today with a chief complaint of laceration. His laceration is located to the right lateral upper forearm. He first noticed the laceration three days ago after scraping his arm on a cabinet at home. He's noticed gradual erythema to the site which is spreading up his right upper extremity for the past three days. His last tetanus vaccination was in 2007. He's running fevers of 100-101.8.   Review of Systems  Constitutional: Positive for fever. Negative for fatigue.  HENT: Negative for congestion.   Respiratory: Negative for cough.   Skin: Positive for color change and wound.       Past Medical History:  Diagnosis Date  . AAA (abdominal aortic aneurysm) (HCC)    3.8 cm CTA 05/2015  . Anxiety   . COPD (chronic obstructive pulmonary disease) (HCC)    on home O2 4LPM  . Diverticulosis   . Former smoker    quit 2001  . Hemorrhoids   . Kidney stones    passed spontaneously  . OSA on CPAP    last study- in the home possibility- 2015, pt. unsure   . Prediabetes    Hgb A1C 5.8 in Jan 2017  . PUD (peptic ulcer disease)   . Right knee DJD 10/31/2011   Severe  . S/P TAVR (transcatheter aortic valve replacement) 06/26/2015   26 mm Edwards Sapien 3 transcatheter heart valve placed via percutaneous right transfemoral approach  . Severe aortic stenosis    a. Echo 11/16:  Mod LVH, EF 55-60%, no RWMA, Gr 1 DD, severe AS (mean 51 mmHg; peak 86 mmHg), MAC, mild LAE  b. s/p TAVR on 06/26/2015 with 12m Edward Sapien 3 THV  . Shortness of breath dyspnea      Social History   Social History  . Marital status: Married    Spouse name: N/A  . Number of children: 1  . Years of education: N/A   Occupational History  . 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 Smoking status: Former Smoker   Packs/day: 1.50    Years: 53.00    Types: Cigarettes    Quit date: 06/23/1999  . Smokeless tobacco: Former USystems developer   Types: Snuff, Chew  . Alcohol use No  . Drug use: No  . Sexual activity: Not on file   Other Topics Concern  . 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      Past Surgical History:  Procedure Laterality Date  . CARDIAC CATHETERIZATION N/A 05/15/2015   Procedure: Right/Left Heart Cath and Coronary Angiography;  Surgeon: MSherren Mocha MD;  [facility];  Service: Cardiovascular;  Laterality: N/A;  . CHOLECYSTECTOMY    . EYE SURGERY     bilateral cataracts removed, /w IOL  . KNEE SURGERY Right 1985   arthrosopic -   . TEE WITHOUT CARDIOVERSION N/A 06/26/2015   Procedure: TRANSESOPHAGEAL ECHOCARDIOGRAM (TEE);  Surgeon: MSherren Mocha MD;  [facility]  Service: Open Heart Surgery;  Laterality: N/A;  . TONSILLECTOMY    . TRANSCATHETER AORTIC VALVE REPLACEMENT, TRANSFEMORAL N/A 06/26/2015  Procedure: TRANSCATHETER AORTIC VALVE REPLACEMENT, TRANSFEMORAL;  Surgeon: [name], MD;  Location: [facility];  Service: Open Heart Surgery;  Laterality: N/A;    Family History  Problem Relation Age of Onset  . Stroke Mother   . Heart attack Mother 24       deceased  . Prostate cancer Father 75  . Colon cancer Neg Hx     Allergies  Allergen Reactions  . Iohexol Hives and Swelling    Pt reports swelling, redness, hives, and blisters   . Atorvastatin     REACTION: aches  . Celecoxib     REACTION: rash  . Clinoril [Sulindac]     REACTION: rash  . Nitrofurantoin   . Spiriva Handihaler [Tiotropium Bromide Monohydrate] Other (See Comments)    Dry mouth  . Sulfa Antibiotics     Intolerant but unrecalled.     Current Outpatient Prescriptions on File Prior to Visit  Medication Sig Dispense Refill  . albuterol (PROAIR HFA) 108 (90 Base) MCG/ACT  inhaler INHALE 1-2 PUFFS INTO THE LUNGS EVERY 6 (SIX) HOURS AS NEEDED FOR WHEEZING. 8.5 each 11  . amLODipine (NORVASC) 5 MG tablet Take 1 tablet (5 mg total) by mouth 2 (two) times daily.    Marland Kitchen aspirin EC 81 MG tablet Take 1 tablet (81 mg total) by mouth daily.    . budesonide-formoterol (SYMBICORT) 160-4.5 MCG/ACT inhaler Inhale 2 puffs into the lungs 2 (two) times daily. 1 Inhaler 11  . fluocinonide cream (LIDEX) 5.00 % Apply 1 application topically 2 (two) times daily as needed. Use sparingly. 60 g 1  . furosemide (LASIX) 20 MG tablet Take 1 tablet (20 mg total) by mouth daily. 30 tablet 10  . guaiFENesin (MUCINEX) 600 MG 12 hr tablet Take 600 mg by mouth 2 (two) times daily.     Marland Kitchen ibuprofen (ADVIL,MOTRIN) 200 MG tablet Take 200 mg by mouth every 6 (six) hours as needed (pain). Reported on 07/30/2015    . ipratropium-albuterol (DUONEB) 0.5-2.5 (3) MG/3ML SOLN Take 3 mLs by nebulization 3 (three) times daily. 360 mL 11  . LORazepam (ATIVAN) 1 MG tablet TAKE 1/4 TO 1/2 TABLET BY MOUTH EVERY 6 HOURS AS NEEDED 30 tablet 1  . metoprolol tartrate (LOPRESSOR) 25 MG tablet TAKE 0.5 TABLETS (12.5 MG TOTAL) BY MOUTH 2 (TWO) TIMES DAILY. 90 tablet 3  . OXYGEN Inhale into the lungs. 4 Liters    . polyethylene glycol (MIRALAX / GLYCOLAX) packet Take 17 g by mouth daily as needed (constiptation).    Marland Kitchen Respiratory Therapy Supplies (FLUTTER) DEVI Use as directed 1 each 0  . sodium chloride (OCEAN) 0.65 % SOLN nasal spray Place 2 sprays into both nostrils daily as needed for congestion.      No current facility-administered medications on file prior to visit.     BP (!) 150/80   Pulse 80   Temp 98.3 F (36.8 C) (Oral)   Wt 274 lb 12.8 oz (124.6 kg)   SpO2 96%   BMI 39.43 kg/m    Objective:   Physical Exam  Constitutional: He appears well-nourished.  Neck: Neck supple.  Skin: Skin is warm and dry. There is erythema.     1.5 cm liner, superficial laceration to right lateral upper forearm. Moderate  erythema extending just above right elbow to proximal to wrist. Mild warmth.          Assessment & Plan:  Laceration with Cellulitis:  Located to right lateral forearm.  Exam today with moderate erythema  surrounding laceration site that is concerning for cellulitis. Rx for Keflex course sent to pharmacy.  Td provided today. Discussed home care instructions. Follow on Monday with PCP or myself for recheck.  Sheral Flow, NP

## 2017-01-08 NOTE — Telephone Encounter (Signed)
Noted  

## 2017-01-08 NOTE — Patient Instructions (Signed)
Start Cephalexin antibiotics. Take 1 capsule by mouth four times daily for 7 days.  Keep the site clean and dry. Protect it with a large Band-Aid.  Schedule a follow up visit on Monday next week with either myself or with Dr. Damita Dunnings.  It was a pleasure meeting you!

## 2017-01-08 NOTE — Telephone Encounter (Signed)
I spoke with pts wife; pt scheduled appt today at 10:15 with Allie Bossier NP.

## 2017-01-08 NOTE — Addendum Note (Signed)
Addended by: Jacqualin Combes on: 01/08/2017 12:23 PM   Modules accepted: Orders

## 2017-01-12 ENCOUNTER — Ambulatory Visit (INDEPENDENT_AMBULATORY_CARE_PROVIDER_SITE_OTHER): Payer: PPO | Admitting: Family Medicine

## 2017-01-12 ENCOUNTER — Encounter: Payer: Self-pay | Admitting: Family Medicine

## 2017-01-12 DIAGNOSIS — I10 Essential (primary) hypertension: Secondary | ICD-10-CM | POA: Diagnosis not present

## 2017-01-12 DIAGNOSIS — L03113 Cellulitis of right upper limb: Secondary | ICD-10-CM

## 2017-01-12 NOTE — Progress Notes (Signed)
Patient has question as to whether or not he is taking the Amlodipine.  Please encourage patient to bring up to date list at each visit.  Mike Craze, CMA

## 2017-01-12 NOTE — Patient Instructions (Addendum)
Check your BP at home and check your meds. Update [place] about your meds and BP in about 1-2 day.   I need to know about the redness in your arm at the same point to decide about how long to stay on keflex.  Take the antibiotics as prescribed.  Take care.  Glad to see you.

## 2017-01-12 NOTE — Progress Notes (Signed)
Unclear if pt is on BP meds,  D/w pt.  See AVS.  he did not keep track of his medicines and he could not verify what he was or was not taking. Discussed with patient about bringing an accurate list of medications, bring all of his medications to the visit, or any other way he can come up with to verify his medication list. Sent home with medication list for him to use.  Follow-up of right arm cellulitis. Previous injury where he had scraped the skin. Seen recently in clinic, reviewed previous office visit note. Started on Keflex in the meantime. No fevers for 2 days, but the area still feels hot. Less painful but not resolved compared to previous. No new systemic symptoms.  Still on oxygen, at baseline.   Meds, vitals, and allergies reviewed.   ROS: Per HPI unless specifically indicated in ROS section   nad ncat On O2  rrr ctab Right forearm with diffuse erythematous changes but this is improved compared to previous. Slightly tender but no fluctuant mass. Capillary refill still intact. Normal range of motion at the elbow wrist and hand.

## 2017-01-13 ENCOUNTER — Telehealth: Payer: Self-pay | Admitting: Cardiovascular Disease

## 2017-01-13 ENCOUNTER — Other Ambulatory Visit: Payer: Self-pay

## 2017-01-13 DIAGNOSIS — L03119 Cellulitis of unspecified part of limb: Secondary | ICD-10-CM | POA: Insufficient documentation

## 2017-01-13 MED ORDER — AMLODIPINE BESYLATE 5 MG PO TABS: 5.0000 mg | ORAL_TABLET | Freq: Two times a day (BID) | ORAL | 1 refills | Status: DC

## 2017-01-13 NOTE — Assessment & Plan Note (Signed)
See above. Encouraged patient to keep track of his medications. Sent home with med list for him to verify. Reasonable to take his baseline medications if not already doing so and then update me about his blood pressure. I told him I can do much of he doesn't know what medicine he is taking. He said he would check on this.

## 2017-01-13 NOTE — Assessment & Plan Note (Signed)
Some better, not resolved. There was no expectation for his symptoms to be fully resolved at this point but at least he is some better. Continue current antibiotics. Update me in a few days. We may have to extend his antibiotics depending on his response over the next few days. Explained to patient. He agrees. He will need update [place] and he understands that is his responsibility.

## 2017-01-13 NOTE — Telephone Encounter (Signed)
Mrs Cotham left v/m(DPR signed); pt was seen 02/12/17 and was to cb with name of med taking for BP. Pt taking amlodipine 5 mg taking 2 tabs po bid. Request refill amlodipine to [facility] [address].Please advise.

## 2017-01-13 NOTE — Telephone Encounter (Signed)
[name] [name] is calling because [name] [name] blood Pressure is running high. Please call

## 2017-01-13 NOTE — Telephone Encounter (Signed)
Max total daily dose should be 10mg .   Update [place] if BP isn't controlled.  Thanks.

## 2017-01-13 NOTE — Telephone Encounter (Signed)
Wife says patient is taking two Amlodipine 5 mg (10 mg total) twice daily for a total of 20 mg daily dose.  Your comment previously stated that the max total daily dose should be 10 mg.  So does he need to cut back on the current dosage?

## 2017-01-13 NOTE — Telephone Encounter (Signed)
Returned pts call.  Spoke with pt. Pt states that his bp was running high, even tho he hasn't checked it this morning. After reviewing the pts chart, Dr. Damita Dunnings manages pts bp.  Pt saw Dr. Damita Dunnings yesterday and Dr. Damita Dunnings believes that the pt isn't taking his bp meds. I went over meds with pt and it is apparent that pt isn't taking meds. Pt was advised to contact Dr. Josefine Class office and go over the meds with them, per Dr. Josefine Class request at the o/v yesterday, 01/12/17. Pt and his wife agreed to call Dr. Josefine Class office.

## 2017-01-14 NOTE — Telephone Encounter (Signed)
Left message at home number for patient's wife to call back. 

## 2017-01-14 NOTE — Telephone Encounter (Signed)
Yes, max dose of 10mg  a day.  Update [place] about his BP and we'll go from there.

## 2017-01-14 NOTE — Telephone Encounter (Addendum)
They should lay all of his bottles out on the table and verify all of his medications. This is exactly what I was talking about at the [facility] when I told him I want him to verify his current medications.  We try to verify patient's medications at every single office visit and we always send patients home with an updated list of the medications. We always encourage patient to bring in a list of updated medications and/or bring in all of their bottles. The maximum dose of amlodipine is still 10 mg a day. He should not take more than 10 mg of amlodipine per day. If he has been taking 5 mg then he should continue that.  He should check his blood pressure and write down the numbers on multiple readings.   The he needs an office visit where he brings every single prescription bottle with his name on it to the office visit. If he is not going to bring all of his medicines to the office visit, then he should not bother coming in. Thanks.

## 2017-01-14 NOTE — Telephone Encounter (Signed)
Patient and wife notified as instructed by telephone. Patient's wife stated that the bottle that he has now shows Amlodipine 5 mg take once a day. [name] stated that she does not understand the questions about the dose because they just called to request a 90 day supply of the medication because it is cheaper for the patient to get it that way. Patient's wife stated that she is going to hold off on picking the new script up until Dr. Damita Dunnings can see this message.  [name] wants to know if patient should continue to take the medication once a day like he has been or change to twice a day?

## 2017-01-15 ENCOUNTER — Telehealth: Payer: Self-pay | Admitting: Family Medicine

## 2017-01-15 ENCOUNTER — Other Ambulatory Visit: Payer: Self-pay | Admitting: Primary Care

## 2017-01-15 DIAGNOSIS — L03113 Cellulitis of right upper limb: Secondary | ICD-10-CM

## 2017-01-15 NOTE — Telephone Encounter (Signed)
Pt called stating he had missed a call.

## 2017-01-15 NOTE — Telephone Encounter (Signed)
Electronic refill request. Cephalexin Last office visit:   01/12/17 Last Filled:    28 capsule 0 01/08/2017  Please advise.

## 2017-01-15 NOTE — Addendum Note (Signed)
Addended by: Josetta Huddle on: 01/15/2017 12:09 PM   Modules accepted: Orders

## 2017-01-15 NOTE — Telephone Encounter (Signed)
Wife says patient is taking Amlodipine 5 mg. Once daily.  Wife advised to continue this dose, take several BP readings between now and Tuesday and bring those readings along with all of his medications to the appointment on Tuesday.

## 2017-01-15 NOTE — Telephone Encounter (Signed)
error 

## 2017-01-20 ENCOUNTER — Encounter: Payer: Self-pay | Admitting: Family Medicine

## 2017-01-20 ENCOUNTER — Ambulatory Visit (INDEPENDENT_AMBULATORY_CARE_PROVIDER_SITE_OTHER): Payer: PPO | Admitting: Family Medicine

## 2017-01-20 DIAGNOSIS — G4733 Obstructive sleep apnea (adult) (pediatric): Secondary | ICD-10-CM

## 2017-01-20 DIAGNOSIS — I10 Essential (primary) hypertension: Secondary | ICD-10-CM | POA: Diagnosis not present

## 2017-01-20 DIAGNOSIS — L03113 Cellulitis of right upper limb: Secondary | ICD-10-CM | POA: Diagnosis not present

## 2017-01-20 MED ORDER — LISINOPRIL 10 MG PO TABS: 10.0000 mg | ORAL_TABLET | Freq: Every day | ORAL | 3 refills | Status: DC

## 2017-01-20 NOTE — Progress Notes (Signed)
Hypertension:    Using medication without problems or lightheadedness: yes Chest pain with exertion:no Edema:no Short of breath: at baseline, not worse than normal.  Was on 5mg  amlodipine prev, now 10mg  a day for the last week.   Recheck BP 170/90 B arms.    He has had trouble sleeping for years, difficulty with starting and maintaining sleep. He is going to follow-up with pulmonary and I want their input on this.  Routed to pulmonary in the meantime.    Meds, vitals, and allergies reviewed.   PMH and SH reviewed  ROS: Per HPI unless specifically indicated in ROS section   GEN: nad, alert and oriented, on O2 at baseline.  HEENT: mucous membranes moist NECK: supple w/o LA CV: rrr. PULM: ctab, no inc wob ABD: soft, +bs EXT: no edema SKIN: no acute rash- his right arm looks remarkably better and healed from previous cellulitis.

## 2017-01-20 NOTE — Patient Instructions (Addendum)
Don't change your old meds.  Continue as is with the previous meds.  Add on lisinopril 10mg  a day.  Update me about your BP in about 1 week.  Take care.  Glad to see you.

## 2017-01-21 NOTE — Assessment & Plan Note (Signed)
Continue atenolol 10 mg a day. Add on lisinopril 10 mg a day. Specific instructions given to patient. See after visit summary. He will update me about his blood pressure in about 1 week. I thanked him for bringing on his medications at the office visit today.

## 2017-01-21 NOTE — Assessment & Plan Note (Signed)
Looks to be resolved. Observe.

## 2017-01-21 NOTE — Assessment & Plan Note (Signed)
Still using CPAP.  He has had trouble sleeping for years, difficulty with starting and maintaining sleep. He is going to follow-up with pulmonary and I want their input on this.  Routed to pulmonary in the meantime.

## 2017-01-22 DIAGNOSIS — J449 Chronic obstructive pulmonary disease, unspecified: Secondary | ICD-10-CM | POA: Diagnosis not present

## 2017-01-23 ENCOUNTER — Ambulatory Visit: Payer: PPO | Admitting: Pulmonary Disease

## 2017-02-06 ENCOUNTER — Telehealth: Payer: Self-pay | Admitting: Cardiovascular Disease

## 2017-02-06 NOTE — Telephone Encounter (Signed)
New message    Pt c/o BP issue: STAT if pt c/o blurred vision, one-sided weakness or slurred speech  1. What are your last 5 BP readings? 86/48  2. Are you having any other symptoms (ex. Dizziness, headache, blurred vision, passed out)? dizziness  3. What is your BP issue? Pt BP is low.

## 2017-02-06 NOTE — Telephone Encounter (Signed)
Received call from pt and wife.  Wife states pt has been seeing Dr Damita Dunnings who has made adjustments to his medications and now his BP is too low.  She reports it was last checked 30 mins ago and it was 86/48.  Per wife, pt last took his medications yesterday however when speaking with pt he reports today he took Metoprolol 25 mg 1/2 tablet, lisinopril 10 mg, Furosemide 20 mg and ASA 81 mg.  He has not taken Amlodipine today.  He denies having any recent GI issues and has been eating and drinking well.  Last night he reports his BP was 103/57, 102/56, 108/70 and 107/63.  He did not have a reading for this AM.  Advised pt to hold pm medications.  If in the AM BP remains low to hold his medications and call Dr Josefine Class office for further instructions since he has been adjusting his medications.  Pt states understanding.  Will forward information to Dr Burt Knack for his knowledge.

## 2017-02-08 DIAGNOSIS — J449 Chronic obstructive pulmonary disease, unspecified: Secondary | ICD-10-CM | POA: Diagnosis not present

## 2017-02-10 NOTE — Telephone Encounter (Signed)
Thanks agree.

## 2017-02-11 NOTE — Telephone Encounter (Signed)
Spoke to patient by telephone and was advised that his BP about 5:15 today was 128/68. Patient stated that the readings that he has gotten today and yesterday are  120/69, 123/70 and 126/58. Patient stated that he stopped taking the Amlodipine and has not taken any in the last 5 days.

## 2017-02-11 NOTE — Telephone Encounter (Signed)
Patient's wife returned Regina's call. Please call patient back at [phone].

## 2017-02-11 NOTE — Telephone Encounter (Signed)
I just got this note.  What is his BP now?  Now much amlodipine has he been taking in the last 5 days?   Let me know.  Thanks.

## 2017-02-11 NOTE — Telephone Encounter (Signed)
Left message on answering machine at home number to call back. 

## 2017-02-12 NOTE — Telephone Encounter (Signed)
Continue as is, off the amlodipine.  Med list updated.  Thanks.

## 2017-02-12 NOTE — Telephone Encounter (Signed)
Left detailed message on voicemail.  

## 2017-02-13 ENCOUNTER — Other Ambulatory Visit: Payer: Self-pay | Admitting: Cardiovascular Disease

## 2017-02-22 DIAGNOSIS — J449 Chronic obstructive pulmonary disease, unspecified: Secondary | ICD-10-CM | POA: Diagnosis not present

## 2017-03-11 DIAGNOSIS — J449 Chronic obstructive pulmonary disease, unspecified: Secondary | ICD-10-CM | POA: Diagnosis not present

## 2017-03-24 DIAGNOSIS — J449 Chronic obstructive pulmonary disease, unspecified: Secondary | ICD-10-CM | POA: Diagnosis not present

## 2017-04-10 DIAGNOSIS — J449 Chronic obstructive pulmonary disease, unspecified: Secondary | ICD-10-CM | POA: Diagnosis not present

## 2017-04-24 DIAGNOSIS — J449 Chronic obstructive pulmonary disease, unspecified: Secondary | ICD-10-CM | POA: Diagnosis not present

## 2017-05-11 DIAGNOSIS — J449 Chronic obstructive pulmonary disease, unspecified: Secondary | ICD-10-CM | POA: Diagnosis not present

## 2017-05-22 ENCOUNTER — Other Ambulatory Visit: Payer: Self-pay | Admitting: Cardiovascular Disease

## 2017-05-24 DIAGNOSIS — J449 Chronic obstructive pulmonary disease, unspecified: Secondary | ICD-10-CM | POA: Diagnosis not present

## 2017-06-10 DIAGNOSIS — J449 Chronic obstructive pulmonary disease, unspecified: Secondary | ICD-10-CM | POA: Diagnosis not present

## 2017-06-13 ENCOUNTER — Other Ambulatory Visit: Payer: Self-pay | Admitting: Cardiovascular Disease

## 2017-06-16 DIAGNOSIS — J449 Chronic obstructive pulmonary disease, unspecified: Secondary | ICD-10-CM | POA: Diagnosis not present

## 2017-06-24 DIAGNOSIS — J449 Chronic obstructive pulmonary disease, unspecified: Secondary | ICD-10-CM | POA: Diagnosis not present

## 2017-07-11 DIAGNOSIS — J449 Chronic obstructive pulmonary disease, unspecified: Secondary | ICD-10-CM | POA: Diagnosis not present

## 2017-07-14 ENCOUNTER — Other Ambulatory Visit: Payer: Self-pay | Admitting: Cardiovascular Disease

## 2017-07-17 ENCOUNTER — Other Ambulatory Visit: Payer: Self-pay | Admitting: Cardiovascular Disease

## 2017-07-19 DIAGNOSIS — G4733 Obstructive sleep apnea (adult) (pediatric): Secondary | ICD-10-CM | POA: Diagnosis not present

## 2017-07-25 DIAGNOSIS — J449 Chronic obstructive pulmonary disease, unspecified: Secondary | ICD-10-CM | POA: Diagnosis not present

## 2017-08-11 DIAGNOSIS — J449 Chronic obstructive pulmonary disease, unspecified: Secondary | ICD-10-CM | POA: Diagnosis not present

## 2017-08-14 ENCOUNTER — Other Ambulatory Visit: Payer: Self-pay | Admitting: Pulmonary Disease

## 2017-08-22 DIAGNOSIS — J449 Chronic obstructive pulmonary disease, unspecified: Secondary | ICD-10-CM | POA: Diagnosis not present

## 2017-08-29 ENCOUNTER — Other Ambulatory Visit: Payer: Self-pay | Admitting: Physician Assistant

## 2017-09-03 ENCOUNTER — Ambulatory Visit: Payer: PPO | Admitting: Cardiovascular Disease

## 2017-09-08 DIAGNOSIS — J449 Chronic obstructive pulmonary disease, unspecified: Secondary | ICD-10-CM | POA: Diagnosis not present

## 2017-09-18 ENCOUNTER — Other Ambulatory Visit: Payer: Self-pay | Admitting: Pulmonary Disease

## 2017-09-21 ENCOUNTER — Telehealth: Payer: Self-pay

## 2017-09-21 ENCOUNTER — Telehealth: Payer: Self-pay | Admitting: Pulmonary Disease

## 2017-09-21 MED ORDER — BUDESONIDE-FORMOTEROL FUMARATE 160-4.5 MCG/ACT IN AERO: INHALATION_SPRAY | RESPIRATORY_TRACT | 0 refills | Status: DC

## 2017-09-21 NOTE — Telephone Encounter (Signed)
Copied from [place] [id number]. Topic: Inquiry >> Sep 21, 2017  1:42 PM Pricilla Handler wrote: Reason for CRM: Patient is completely out of SYMBICORT 160-4.5 MCG/ACT inhaler. Patient would like a refill sent to his preferred pharmacy: [id number] - [place], [place] [phone] (Phone) [phone] (Fax).

## 2017-09-21 NOTE — Telephone Encounter (Unsigned)
Copied from [place] [id number]. Topic: Inquiry >> Sep 21, 2017  1:42 PM Pricilla Handler wrote: Reason for CRM: Patient is completely out of SYMBICORT 160-4.5 MCG/ACT inhaler. Patient would like a refill sent to his preferred pharmacy: [id number] - [place], [place] [phone] (Phone) [phone] (Fax).

## 2017-09-21 NOTE — Telephone Encounter (Signed)
Patient has also requested refill on Symbicort through pulmonology (Dr. Halford Chessman) today who has been managing this med.  On 08/2017 refill by Dr. Halford Chessman stated that patient needed an office visit.    I have instructed patient to please call their office and get set up for his appointment in order to have them continue to fill his medication.  He can call us back if he has any trouble but it would not be appropriate for [place] to fill this given Dr. Juanetta Gosling instructions on last script.    Patient verbalizes understanding and will let us know if he needs anything further.

## 2017-09-21 NOTE — Telephone Encounter (Signed)
Called and spoke to pt. Pt is requesting a symbicort rx refill. Pt last seen in 2017 and advised to continue on Symbicort. Rx sent to preferred pharmacy for one inhaler. Appt made with SG on 10/12/17 at 1015. Pt aware he must keep this appt. Nothing further needed at this time.

## 2017-09-21 NOTE — Telephone Encounter (Signed)
Patient states needs med today, he is out of this.  He has OV scheduled.

## 2017-09-22 DIAGNOSIS — J449 Chronic obstructive pulmonary disease, unspecified: Secondary | ICD-10-CM | POA: Diagnosis not present

## 2017-10-09 DIAGNOSIS — J449 Chronic obstructive pulmonary disease, unspecified: Secondary | ICD-10-CM | POA: Diagnosis not present

## 2017-10-12 ENCOUNTER — Ambulatory Visit: Payer: PPO | Admitting: Acute Care

## 2017-10-12 ENCOUNTER — Ambulatory Visit: Payer: PPO | Admitting: Pulmonary Disease

## 2017-10-12 ENCOUNTER — Encounter: Payer: Self-pay | Admitting: Acute Care

## 2017-10-12 VITALS — BP 140/78 | HR 92 | Wt 281.0 lb

## 2017-10-12 DIAGNOSIS — J438 Other emphysema: Secondary | ICD-10-CM | POA: Diagnosis not present

## 2017-10-12 DIAGNOSIS — J449 Chronic obstructive pulmonary disease, unspecified: Secondary | ICD-10-CM | POA: Diagnosis not present

## 2017-10-12 DIAGNOSIS — G4733 Obstructive sleep apnea (adult) (pediatric): Secondary | ICD-10-CM

## 2017-10-12 MED ORDER — BUDESONIDE-FORMOTEROL FUMARATE 160-4.5 MCG/ACT IN AERO: 2.0000 | INHALATION_SPRAY | Freq: Two times a day (BID) | RESPIRATORY_TRACT | 6 refills | Status: DC

## 2017-10-12 NOTE — Assessment & Plan Note (Signed)
At baseline Refuses to try Trelegy Needs 5 L pulsed oxygen to maintain 88% sat Plan: We will walk you today to see if you are desaturating on pulsed oxygen. We will schedule you for PFT's to evaluate if your COPD has progressed.  evaluate for worsening of your COPD. Continue on Symbicort 2 puffs twice daily We will send in a prescription for your Symbicort. Rinse mouth after use. Follow up with Dr. Halford Chessman or Judson Roch NP. In 3 months  or before as needed.  Please contact office for sooner follow up if symptoms do not improve or worsen or seek emergency care  We will place an order for a new POC for  pulsed oxygen must have  5 L.Marland Kitchen

## 2017-10-12 NOTE — Patient Instructions (Addendum)
It is good to meet you today. We will walk you today to see if you are desaturating on pulsed oxygen. We will schedule you for PFT's to evaluate if your COPD has progressed.  evaluate for worsening of your COPD. Try Melatonin 10 mg at bedtime. Continue on Symbicort 2 puffs twice daily We will send in a prescription for your Symbicort. Rinse mouth after use. Continue on CPAP at bedtime. You appear to be benefiting from the treatment Goal is to wear for at least 6 hours each night for maximal clinical benefit. Continue to work on weight loss, as the link between excess weight  and sleep apnea is well established.  Do not drive if sleepy. Remember to clean mask, tubing, filter, and reservoir once weekly with soapy water.  Follow up with Dr. Halford Chessman or Judson Roch NP. In 3 months  or before as needed.  Please contact office for sooner follow up if symptoms do not improve or worsen or seek emergency care  We will place an order for a new POC for  pulsed oxygen must have  5 L.Marland Kitchen

## 2017-10-12 NOTE — Assessment & Plan Note (Signed)
Compliant with therapy States he is having trouble sleeping Refuses CPAP titration Plan: Try Melatonin 10 mg at bedtime. Continue on CPAP at bedtime. You appear to be benefiting from the treatment Goal is to wear for at least 6 hours each night for maximal clinical benefit. Continue to work on weight loss, as the link between excess weight  and sleep apnea is well established.  Do not drive if sleepy. Remember to clean mask, tubing, filter, and reservoir once weekly with soapy water.  Follow up with Dr. Halford Chessman or Judson Roch NP. In 3 months  or before as needed.  Please contact office for sooner follow up if symptoms do not improve or worsen or seek emergency care  We will place an order for a new POC for  pulsed oxygen must have  5 L.[name] [name]

## 2017-10-12 NOTE — Progress Notes (Addendum)
History of Present Illness [name] [name] is a 84 y.o. male  former smoker with OSA on CPAP , COPD, and chronic respiratory failure.He is followed by Dr. Halford [name].He is a former patient of Dr. Lenna [name].    10/12/2017 Pt. Presents for follow up. He was last seen in the office 01/2016. He did not make the 6 month follow up that was part of his plan   History of Present Illness [name] [name] is a 84 y.o. male He doesn't feel like nebulizer therapy is as effective as using symbicort.  He didn't think spiriva helped.  He is having occasional cough and wheeze.  Bringing up clear sputum.  Denies chest pain, or leg swelling.    He was advised by [employer] to adjust his CPAP mask >> fits much better, and sleeping better.   Plan after last OV in 01/2016 was as follows: COPD. - didn't feel LAMA's were benefitial - resume symbicort - continue prn albuterol - d/c pulmicort nebulizer  Obstructive sleep apnea. - continue CPAP   Chronic respiratory failure with hypoxia. - continue supplemental oxygen >> re-assess at next visit   Patient Instructions  Symbicort two puffs twice per day  Pt. Presents today. He states she has been using his Symbiocrt daily and his proair as needed. He states she is using the proair about once a month. He does not want to try Trelegy, as it is a powdered inhaler. He states he does not sleep well. He states he has been going through this for about 10 years, but it is getting worse. He states he has nights where he does not sleep at all. He is compliant with his CPAP every night.He states he has no issues with the nasal prongs. He is tolerating the CPAP pressure at 12 cm H2O.He states he feels the CPAP does help him. He just wishes he could sleep better. He refuses a CPAP titration. He states he sleeps in a cold dark room. No TV or stimulation before bed. He does not go to bed at the same time every night.He states he goes to bed when sleepy.He denies fever, chest pain,  orthopnea or hemoptysis.    Test Results:  Down Load 3/27-4/25/2019 100% complaince > 4 hours 100% of the time Average use >> 11 hours 4 minutes Set pressure is 12 cm H2O AHI is 10.8  Sleep tests PSG 12/13/99 >> AHI 33  Pulmonary tests Spirometry 04/11/15 >> FEV1 0.77 (24%), FEV1% 50 CT chest 05/22/15 >> 4 mm RUL nodule  Cardiac tests Rt/Lt heart cath 05/15/15 >> severe AS, mild pulmonary hypertension Echo 07/30/15 >> mild LVH, EF 55 to 16%, grade 1 diastolic dysfx  Past medical hx Anxiety, PUD, Diverticulosis, Nephrolithiasis, AAA, severe AS s/p TAVR January 2017  Past surgical hx, Allergies, Family hx, Social hx all reviewed.  Vital Signs BP 130/78 (BP Location: Left Arm, Cuff Size: Normal)   Pulse 78   Ht _0  (1.778 m)   Wt 271 lb 3.2 oz (123 kg)   SpO2 92%   BMI 38.91 kg/m      CBC Latest Ref Rng & Units 12/03/2016 12/02/2016 07/05/2015  WBC 3.8 - 10.6 K/uL 8.4 9.2 6.5  Hemoglobin 13.0 - 18.0 g/dL 12.5(L) 13.7 11.5(L)  Hematocrit 40.0 - 52.0 % 36.3(L) 40.1 35.5(L)  Platelets 150 - 440 K/uL 189 203 242    BMP Latest Ref Rng & Units 12/03/2016 12/02/2016 11/14/2016  Glucose 65 - 99 mg/dL 159(H) 101(H) 100(H)  BUN 6 -  20 mg/dL _0 Creatinine 0.61 - 1.24 mg/dL 0.75 0.84 0.90  Sodium 135 - 145 mmol/L 141 137 142  Potassium 3.5 - 5.1 mmol/L 3.4(L) 3.8 4.3  Chloride 101 - 111 mmol/L 97(L) 95(L) 100  CO2 22 - 32 mmol/L 36(H) 34(H) 36(H)  Calcium 8.9 - 10.3 mg/dL 8.9 9.4 9.3    BNP No results found for: BNP  ProBNP No results found for: PROBNP  PFT No results found for: FEV1PRE, FEV1POST, FVCPRE, FVCPOST, TLC, DLCOUNC, PREFEV1FVCRT, PSTFEV1FVCRT  No results found.   Past medical hx Past Medical History:  Diagnosis Date  . AAA (abdominal aortic aneurysm) (HCC)    3.8 cm CTA 05/2015  . Anxiety   . COPD (chronic obstructive pulmonary disease) (HCC)    on home O2 4LPM  . Diverticulosis   . Former smoker    quit 2001  . Hemorrhoids   .  Kidney stones    passed spontaneously  . OSA on CPAP    last study- in the home possibility- 2015, pt. unsure   . Prediabetes    Hgb A1C 5.8 in Jan 2017  . PUD (peptic ulcer disease)   . Right knee DJD 10/31/2011   Severe  . S/P TAVR (transcatheter aortic valve replacement) 06/26/2015   26 mm Edwards Sapien 3 transcatheter heart valve placed via percutaneous right transfemoral approach  . Severe aortic stenosis    a. Echo 11/16:  Mod LVH, EF 55-60%, no RWMA, Gr 1 DD, severe AS (mean 51 mmHg; peak 86 mmHg), MAC, mild LAE  b. s/p TAVR on 06/26/2015 with 58m Edward Sapien 3 THV  . Shortness of breath dyspnea      Social History   Tobacco Use  . Smoking status: Former Smoker    Packs/day: 1.50    Years: 53.00    Pack years: 79.50    Types: Cigarettes    Last attempt to quit: 06/23/1999    Years since quitting: 18.3  . Smokeless tobacco: Former [employer] developer   Types: Snuff, Chew  Substance Use Topics  . Alcohol use: No  . Drug use: No    [name] reports that he quit smoking about 18 years ago. His smoking use included cigarettes. He has a 79.50 pack-year smoking history. He has quit using smokeless tobacco. His smokeless tobacco use included snuff and chew. He reports that he does not drink alcohol or use drugs.  Tobacco Cessation: Former smoker, quit 2001  Past surgical hx, Family hx, Social hx all reviewed.  Current Outpatient Medications on File Prior to Visit  Medication Sig  . albuterol (PROAIR HFA) 108 (90 Base) MCG/ACT inhaler INHALE 1-2 PUFFS INTO THE LUNGS EVERY 6 (SIX) HOURS AS NEEDED FOR WHEEZING.  .Marland Kitchenaspirin EC 81 MG tablet Take 1 tablet (81 mg total) by mouth daily.  . budesonide-formoterol (SYMBICORT) 160-4.5 MCG/ACT inhaler INHALE 2 PUFFS INTO THE LUNGS 2 (TWO) TIMES DAILY.  . fluocinonide cream (LIDEX) 03.79% Apply 1 application topically 2 (two) times daily as needed. Use sparingly.  . furosemide (LASIX) 20 MG tablet Take 1 tablet (20 mg total) by mouth daily. Please  keep upcoming appt for future refills. Thank you  . guaiFENesin (MUCINEX) 600 MG 12 hr tablet Take 600 mg by mouth 2 (two) times daily.   .Marland Kitchenibuprofen (ADVIL,MOTRIN) 200 MG tablet Take 200 mg by mouth every 6 (six) hours as needed (pain). Reported on 07/30/2015  . lisinopril (PRINIVIL,ZESTRIL) 10 MG tablet Take 1 tablet (10 mg total) by  mouth daily.  Marland Kitchen LORazepam (ATIVAN) 1 MG tablet TAKE 1/4 TO 1/2 TABLET BY MOUTH EVERY 6 HOURS AS NEEDED  . metoprolol tartrate (LOPRESSOR) 25 MG tablet Take 0.5 tablets (12.5 mg total) by mouth 2 (two) times daily. Please make overdue yearly appt with Dr. Burt Knack. 2nd attempt  . OXYGEN Inhale into the lungs. 4 Liters  . polyethylene glycol (MIRALAX / GLYCOLAX) packet Take 17 g by mouth daily as needed (constiptation).  Marland Kitchen Respiratory Therapy Supplies (FLUTTER) DEVI Use as directed  . sodium chloride (OCEAN) 0.65 % SOLN nasal spray Place 2 sprays into both nostrils daily as needed for congestion.    No current facility-administered medications on file prior to visit.      Allergies  Allergen Reactions  . Iohexol Hives and Swelling    Pt reports swelling, redness, hives, and blisters   . Atorvastatin     REACTION: aches  . Celecoxib     REACTION: rash  . Clinoril [Sulindac]     REACTION: rash  . Nitrofurantoin   . Spiriva Handihaler [Tiotropium Bromide Monohydrate] Other (See Comments)    Dry mouth  . Sulfa Antibiotics     Intolerant but unrecalled.     Review Of Systems:  Constitutional:   No  weight loss, night sweats,  Fevers, chills, +fatigue, or  lassitude.  HEENT:   No headaches,  Difficulty swallowing,  Tooth/dental problems, or  Sore throat,                No sneezing, itching, ear ache, nasal congestion, post nasal drip,   CV:  No chest pain,  Orthopnea, PND, swelling in lower extremities, anasarca, dizziness, palpitations, syncope.   GI  No heartburn, indigestion, abdominal pain, nausea, vomiting, diarrhea, change in bowel habits, loss of  appetite, bloody stools.   Resp: + baseline  shortness of breath with exertion or at rest.  No excess mucus, no productive cough,  No non-productive cough,  No coughing up of blood.  No change in color of mucus.  No wheezing.  No chest wall deformity  Skin: no rash or lesions.  GU: no dysuria, change in color of urine, no urgency or frequency.  No flank pain, no hematuria   MS:  No joint pain or swelling.  No decreased range of motion.  No back pain.  Psych:  No change in mood or affect. No depression or anxiety.  No memory loss.   Vital Signs BP 140/78 (BP Location: Left Arm, Cuff Size: Large)   Pulse 92   Wt 281 lb (127.5 kg)   SpO2 (!) 85%   BMI 40.32 kg/m    Physical Exam:  General- No distress,  A&Ox3, pleasant ENT: No sinus tenderness, TM clear, pale nasal mucosa, no oral exudate,no post nasal drip, no LAN Cardiac: S1, S2, regular rate and rhythm, no murmur Chest: No wheeze/ rales/ dullness; no accessory muscle use, no nasal flaring, no sternal retractions Abd.: Soft Non-tender, obese, ND Ext: No clubbing cyanosis, edema Neuro:  normal strength, MAE x 4, A&O x 3 Skin: No rashes, warm and dry Psych: normal mood and behavior   Assessment/Plan  COPD (chronic obstructive pulmonary disease) with emphysema (HCC) At baseline Refuses to try Trelegy Needs 5 L pulsed oxygen to maintain 88% sat Plan: We will walk you today to see if you are desaturating on pulsed oxygen. We will schedule you for PFT's to evaluate if your COPD has progressed.  evaluate for worsening of your COPD. Continue on Symbicort 2  puffs twice daily We will send in a prescription for your Symbicort. Rinse mouth after use. Follow up with Dr. Halford [name] or Judson Roch NP. In 3 months  or before as needed.  Please contact office for sooner follow up if symptoms do not improve or worsen or seek emergency care  We will place an order for a new POC for  pulsed oxygen must have  5 L..   OSA (obstructive sleep  apnea) Compliant with therapy States he is having trouble sleeping Refuses CPAP titration Plan: Try Melatonin 10 mg at bedtime. Continue on CPAP at bedtime. You appear to be benefiting from the treatment Goal is to wear for at least 6 hours each night for maximal clinical benefit. Continue to work on weight loss, as the link between excess weight  and sleep apnea is well established.  Do not drive if sleepy. Remember to clean mask, tubing, filter, and reservoir once weekly with soapy water.  Follow up with Dr. Halford [name] or Judson Roch NP. In 3 months  or before as needed.  Please contact office for sooner follow up if symptoms do not improve or worsen or seek emergency care  We will place an order for a new POC for  pulsed oxygen must have  5 L.Magdalen Spatz, NP 10/12/2017  11:49 AM

## 2017-10-22 DIAGNOSIS — J449 Chronic obstructive pulmonary disease, unspecified: Secondary | ICD-10-CM | POA: Diagnosis not present

## 2017-11-08 DIAGNOSIS — J449 Chronic obstructive pulmonary disease, unspecified: Secondary | ICD-10-CM | POA: Diagnosis not present

## 2017-11-15 ENCOUNTER — Other Ambulatory Visit: Payer: Self-pay | Admitting: Family Medicine

## 2017-11-15 DIAGNOSIS — I714 Abdominal aortic aneurysm, without rupture, unspecified: Secondary | ICD-10-CM

## 2017-11-15 NOTE — Progress Notes (Signed)
Call pt.  H/o aortic aneurysm (ICD10-I71.9). Recommend followup by  ultrasound 11/2017.  Ordered.  Thanks.   Wife advised.  11/16/17  L. [place], CMA

## 2017-11-16 ENCOUNTER — Encounter: Payer: Self-pay | Admitting: Pulmonary Disease

## 2017-11-16 ENCOUNTER — Ambulatory Visit: Payer: PPO | Admitting: Pulmonary Disease

## 2017-11-16 VITALS — BP 160/82 | HR 80 | Ht 70.0 in | Wt 282.6 lb

## 2017-11-16 DIAGNOSIS — G4733 Obstructive sleep apnea (adult) (pediatric): Secondary | ICD-10-CM

## 2017-11-16 DIAGNOSIS — J9611 Chronic respiratory failure with hypoxia: Secondary | ICD-10-CM

## 2017-11-16 DIAGNOSIS — J449 Chronic obstructive pulmonary disease, unspecified: Secondary | ICD-10-CM | POA: Diagnosis not present

## 2017-11-16 DIAGNOSIS — Z9989 Dependence on other enabling machines and devices: Secondary | ICD-10-CM | POA: Diagnosis not present

## 2017-11-16 NOTE — Patient Instructions (Signed)
Will increase CPAP to 14 cm H2O Will arrange for overnight oxygen test with CPAP and 5 liters oxygen  Follow up in 6 months

## 2017-11-16 NOTE — Progress Notes (Signed)
[facility]  Chief Complaint  Patient presents with  . Follow-up    Pt has some chest tightness, and SOB with exertion. Pt is on 5 liters con't.    Vital signs: BP (!) 160/82 (BP Location: Left Arm, Cuff Size: Normal)   Pulse 80   Ht 5\' 10"  (1.778 m)   Wt 282 lb 9.6 oz (128.2 kg)   SpO2 96%   BMI 40.55 kg/m   History of Present Illness: [name] [name] is a 84 y.o. male former smoker OSA, COPD, and chronic respiratory failure.  He doesn't feel like he gets good rest at night.  He uses CPAP nightly.  He uses 5 liters 24/7.  He doesn't have any issue with mask fit.  He is not having much cough, wheeze, sputum, chest pain, fever, hemoptysis.  Leg swelling better.  He feels his inhalers are working well.  He doesn't need to use albuterol much.  He has trouble exercising and struggles with his weight.  Physical Exam:  General - pleasant, wearing oxygen Eyes - pupils reactive, wears glasses ENT - no sinus tenderness, no oral exudate, no LAN Cardiac - regular, 2/6 systolic murmur Chest - decreased breath sounds, no wheeze, rales Abd - soft, non tender Ext - no edema Skin - no rashes Neuro - normal strength Psych - normal mood   CMP Latest Ref Rng & Units 12/03/2016 12/02/2016 11/14/2016  Glucose 65 - 99 mg/dL 159(H) 101(H) 100(H)  BUN 6 - 20 mg/dL 17 18 18   Creatinine 0.61 - 1.24 mg/dL 0.75 0.84 0.90  Sodium 135 - 145 mmol/L 141 137 142  Potassium 3.5 - 5.1 mmol/L 3.4(L) 3.8 4.3  Chloride 101 - 111 mmol/L 97(L) 95(L) 100  CO2 22 - 32 mmol/L 36(H) 34(H) 36(H)  Calcium 8.9 - 10.3 mg/dL 8.9 9.4 9.3  Total Protein 6.5 - 8.1 g/dL - 7.5 -  Total Bilirubin 0.3 - 1.2 mg/dL - 1.6(H) -  Alkaline Phos 38 - 126 U/L - 90 -  AST 15 - 41 U/L - 23 -  ALT 17 - 63 U/L - 13(L) -    ABG    Component Value Date/Time   PHART 7.354 06/26/2015 1556   PCO2ART 57.9 (HH) 06/26/2015 1556   PO2ART 79.0 (L) 06/26/2015 1556   HCO3 32.5 (H) 06/26/2015 1556   TCO2  34 06/26/2015 1556   O2SAT 95.0 06/26/2015 1556    CBC Latest Ref Rng & Units 12/03/2016 12/02/2016 07/05/2015  WBC 3.8 - 10.6 K/uL 8.4 9.2 6.5  Hemoglobin 13.0 - 18.0 g/dL 12.5(L) 13.7 11.5(L)  Hematocrit 40.0 - 52.0 % 36.3(L) 40.1 35.5(L)  Platelets 150 - 440 K/uL 189 203 242     Assessment/Plan:  COPD. - continue symbicort and prn albuterol - he had skin reaction from prior nebulizer use - no benefit from prior LAMA use - he will d/w his PCP later this month about getting Prevnar vaccination  Obstructive sleep apnea. - he is compliant with CPAP and reports benefit - his AHI is still elevated and he is still having trouble with daytime sleepiness - will increase CPAP to 14 cm H2O  Chronic respiratory failure with hypoxia and hypercapnia. - will arrange for ONO with CPAP 14 cm H2O and 5 liters oxygen - if his oxygen is low, then he might need in lab titration with Bipap   Patient Instructions  Will increase CPAP to 14 cm H2O Will arrange for overnight oxygen test with CPAP and  5 liters oxygen  Follow up in 6 months    Chesley Mires, MD [place] 11/16/2017, 10:58 AM  Flow Sheet  Sleep tests: PSG 12/13/99 >> AHI 33 CPAP 10/17/17 to 11/15/17 >> used on 30 of 30 nights with average 10 hrs 59 min.  Average AHI 9.8 with CPAP 12 cm H2O  Pulmonary tests Spirometry 04/11/15 >> FEV1 0.77 (24%), FEV1% 50 CT chest 05/22/15 >> 4 mm RUL nodule  Cardiac tests Rt/Lt heart cath 05/15/15 >> severe AS, mild pulmonary hypertension Echo 07/13/16 >> mod LVH, EF 60 to 65%, grade 1 DD, bioprosthetic AV, mild MR  Past Medical History: He  has a past medical history of AAA (abdominal aortic aneurysm) ([place]), Anxiety, COPD (chronic obstructive pulmonary disease) ([place]), Diverticulosis, Former smoker, Hemorrhoids, Kidney stones, OSA on CPAP, Prediabetes, PUD (peptic ulcer disease), Right knee DJD (10/31/2011), S/P TAVR (transcatheter aortic valve replacement) (06/26/2015),  Severe aortic stenosis, and Shortness of breath dyspnea.  Past Surgical History: He  has a past surgical history that includes Knee surgery (Right, 1985); Cholecystectomy; Tonsillectomy; Cardiac catheterization (N/A, 05/15/2015); Eye surgery; Transcatheter aortic valve replacement, transfemoral (N/A, 06/26/2015); and TEE without cardioversion (N/A, 06/26/2015).  Family History: His family history includes Heart attack (age of onset: 81) in his mother; Prostate cancer (age of onset: 62) in his father; Stroke in his mother.  Social History: He  reports that he quit smoking about 18 years ago. His smoking use included cigarettes. He has a 79.50 pack-year smoking history. He has quit using smokeless tobacco. His smokeless tobacco use included snuff and chew. He reports that he does not drink alcohol or use drugs.  Medications: Allergies as of 11/16/2017      Reactions   Iohexol Hives, Swelling   Pt reports swelling, redness, hives, and blisters    Atorvastatin    REACTION: aches   Celecoxib    REACTION: rash   Clinoril [sulindac]    REACTION: rash   Nitrofurantoin    Spiriva Handihaler [tiotropium Bromide Monohydrate] Other (See Comments)   Dry mouth   Sulfa Antibiotics    Intolerant but unrecalled.       Medication List        Accurate as of 11/16/17 10:58 AM. Always use your most recent med list.          albuterol 108 (90 Base) MCG/ACT inhaler Commonly known as:  PROAIR HFA INHALE 1-2 PUFFS INTO THE LUNGS EVERY 6 (SIX) HOURS AS NEEDED FOR WHEEZING.   aspirin EC 81 MG tablet Take 1 tablet (81 mg total) by mouth daily.   budesonide-formoterol 160-4.5 MCG/ACT inhaler Commonly known as:  SYMBICORT INHALE 2 PUFFS INTO THE LUNGS 2 (TWO) TIMES DAILY.   budesonide-formoterol 160-4.5 MCG/ACT inhaler Commonly known as:  SYMBICORT Inhale 2 puffs into the lungs 2 (two) times daily.   fluocinonide cream 0.05 % Commonly known as:  LIDEX Apply 1 application topically 2 (two) times  daily as needed. Use sparingly.   FLUTTER Devi Use as directed   furosemide 20 MG tablet Commonly known as:  LASIX Take 1 tablet (20 mg total) by mouth daily. Please keep upcoming appt for future refills. Thank you   guaiFENesin 600 MG 12 hr tablet Commonly known as:  MUCINEX Take 600 mg by mouth 2 (two) times daily.   ibuprofen 200 MG tablet Commonly known as:  ADVIL,MOTRIN Take 200 mg by mouth every 6 (six) hours as needed (pain). Reported on 07/30/2015   lisinopril 10 MG tablet Commonly known as:  PRINIVIL,ZESTRIL Take 1 tablet (10 mg total) by mouth daily.   LORazepam 1 MG tablet Commonly known as:  ATIVAN TAKE 1/4 TO 1/2 TABLET BY MOUTH EVERY 6 HOURS AS NEEDED   metoprolol tartrate 25 MG tablet Commonly known as:  LOPRESSOR Take 0.5 tablets (12.5 mg total) by mouth 2 (two) times daily. Please make overdue yearly appt with Dr. Burt Knack. 2nd attempt   OXYGEN Inhale into the lungs. 4 Liters   polyethylene glycol packet Commonly known as:  MIRALAX / GLYCOLAX Take 17 g by mouth daily as needed (constiptation).   sodium chloride 0.65 % Soln nasal spray Commonly known as:  OCEAN Place 2 sprays into both nostrils daily as needed for congestion.

## 2017-11-18 ENCOUNTER — Ambulatory Visit: Payer: PPO

## 2017-11-18 ENCOUNTER — Other Ambulatory Visit: Payer: Self-pay | Admitting: Family Medicine

## 2017-11-18 DIAGNOSIS — I10 Essential (primary) hypertension: Secondary | ICD-10-CM

## 2017-11-18 DIAGNOSIS — R7309 Other abnormal glucose: Secondary | ICD-10-CM

## 2017-11-18 DIAGNOSIS — E559 Vitamin D deficiency, unspecified: Secondary | ICD-10-CM

## 2017-11-19 ENCOUNTER — Telehealth: Payer: Self-pay | Admitting: Pulmonary Disease

## 2017-11-19 NOTE — Telephone Encounter (Signed)
Called and spoke to pt. Pt is calling for update on Rx for oxygen shoulder pack. It appears that pt is referring to POC. Order was placed on 10/12/17 for POC that allows up to 5L pulse.  I have spoken to [place] with [facility], who stated that [facility] does not carry POC that allow up to 5L pulse.  I have spoken to pt and make him aware of this information.  Pt states he will make do with his current concentrator. Nothing further is needed.   Will route to VS as an FYI.

## 2017-11-19 NOTE — Telephone Encounter (Signed)
Noted  

## 2017-11-20 ENCOUNTER — Encounter: Payer: Self-pay | Admitting: Cardiovascular Disease

## 2017-11-20 ENCOUNTER — Ambulatory Visit: Payer: PPO | Admitting: Cardiovascular Disease

## 2017-11-20 VITALS — BP 158/76 | HR 90 | Ht 70.0 in | Wt 281.1 lb

## 2017-11-20 DIAGNOSIS — I5032 Chronic diastolic (congestive) heart failure: Secondary | ICD-10-CM

## 2017-11-20 DIAGNOSIS — I350 Nonrheumatic aortic (valve) stenosis: Secondary | ICD-10-CM | POA: Diagnosis not present

## 2017-11-20 MED ORDER — FUROSEMIDE 20 MG PO TABS: 20.0000 mg | ORAL_TABLET | Freq: Every day | ORAL | 3 refills | Status: DC

## 2017-11-20 MED ORDER — METOPROLOL SUCCINATE ER 25 MG PO TB24: 25.0000 mg | ORAL_TABLET | Freq: Two times a day (BID) | ORAL | 3 refills | Status: DC

## 2017-11-20 MED ORDER — LISINOPRIL 10 MG PO TABS: 10.0000 mg | ORAL_TABLET | Freq: Every day | ORAL | 3 refills | Status: DC

## 2017-11-20 NOTE — Progress Notes (Signed)
Cardiology Office Note Date:  11/20/2017   ID:  [name] [name], DOB [birth year], MRN [id number]  PCP:  Tonia Ghent, MD  Cardiologist:  [name], MD    Chief Complaint  Patient presents with  . Shortness of Breath     History of Present Illness: [name] [name] is a 84 y.o. male who presents for follow-up of aortic valve disease and chronic diastolic heart failure.  The patient developed severe aortic stenosis and was treated with TAVR in January 2017 with a 26 mm Edwards Sapien 3 valve.  The patient has chronic respiratory failure secondary to a combination of COPD, morbid obesity, obstructive sleep apnea, and diastolic heart failure.  He is on 5 L of oxygen.  He is here with his wife today.  Reports relatively stable symptoms.  Says he has "good days and bad days."  He denies chest pain or pressure.  No leg swelling, orthopnea, or PND.   Past Medical History:  Diagnosis Date  . AAA (abdominal aortic aneurysm) ([facility])    3.8 cm CTA 05/2015  . Anxiety   . COPD (chronic obstructive pulmonary disease) ([facility])    on home O2 4LPM  . Diverticulosis   . Former smoker    quit 2001  . Hemorrhoids   . Kidney stones    passed spontaneously  . OSA on CPAP    last study- in the home possibility- 2015, pt. unsure   . Prediabetes    Hgb A1C 5.8 in Jan 2017  . PUD (peptic ulcer disease)   . Right knee DJD 10/31/2011   Severe  . S/P TAVR (transcatheter aortic valve replacement) 06/26/2015   26 mm Edwards Sapien 3 transcatheter heart valve placed via percutaneous right transfemoral approach  . Severe aortic stenosis    a. Echo 11/16:  Mod LVH, EF 55-60%, no RWMA, Gr 1 DD, severe AS (mean 51 mmHg; peak 86 mmHg), MAC, mild LAE  b. s/p TAVR on 06/26/2015 with 68m Edward Sapien 3 THV  . Shortness of breath dyspnea     Past Surgical History:  Procedure Laterality Date  . CARDIAC CATHETERIZATION N/A 05/15/2015   Procedure: Right/Left Heart Cath and Coronary Angiography;  Surgeon: MSherren Mocha MD;  [facility];  Service: Cardiovascular;  Laterality: N/A;  . CHOLECYSTECTOMY    . EYE SURGERY     bilateral cataracts removed, /w IOL  . KNEE SURGERY Right 1985   arthrosopic -   . TEE WITHOUT CARDIOVERSION N/A 06/26/2015   Procedure: TRANSESOPHAGEAL ECHOCARDIOGRAM (TEE);  Surgeon: MSherren Mocha MD;  [facility]  Service: Open Heart Surgery;  Laterality: N/A;  . TONSILLECTOMY    . TRANSCATHETER AORTIC VALVE REPLACEMENT, TRANSFEMORAL N/A 06/26/2015   Procedure: TRANSCATHETER AORTIC VALVE REPLACEMENT, TRANSFEMORAL;  Surgeon: MSherren Mocha MD;  [facility]  Service: Open Heart Surgery;  Laterality: N/A;    Current Outpatient Medications  Medication Sig Dispense Refill  . albuterol (PROAIR HFA) 108 (90 Base) MCG/ACT inhaler INHALE 1-2 PUFFS INTO THE LUNGS EVERY 6 (SIX) HOURS AS NEEDED FOR WHEEZING. 8.5 each 11  . aspirin EC 81 MG tablet Take 1 tablet (81 mg total) by mouth daily.    . budesonide-formoterol (SYMBICORT) 160-4.5 MCG/ACT inhaler Inhale 2 puffs into the lungs 2 (two) times daily. 1 Inhaler 6  . fluocinonide cream (LIDEX) 05.00% Apply 1 application topically 2 (two) times daily as needed. Use sparingly. 60 g 1  . furosemide (LASIX) 20 MG tablet Take 1  tablet (20 mg total) by mouth daily. 90 tablet 3  . guaiFENesin (MUCINEX) 600 MG 12 hr tablet Take 600 mg by mouth 2 (two) times daily.     Marland Kitchen ibuprofen (ADVIL,MOTRIN) 200 MG tablet Take 200 mg by mouth every 6 (six) hours as needed (pain). Reported on 07/30/2015    . lisinopril (PRINIVIL,ZESTRIL) 10 MG tablet Take 1 tablet (10 mg total) by mouth daily. 90 tablet 3  . LORazepam (ATIVAN) 1 MG tablet TAKE 1/4 TO 1/2 TABLET BY MOUTH EVERY 6 HOURS AS NEEDED 30 tablet 1  . metoprolol succinate (TOPROL-XL) 25 MG 24 hr tablet Take 1 tablet (25 mg total) by mouth 2 (two) times daily. 90 tablet 3  . OXYGEN Inhale into the lungs. 4 Liters    . polyethylene glycol (MIRALAX / GLYCOLAX) packet Take 17 g by  mouth daily as needed (constiptation).    Marland Kitchen Respiratory Therapy Supplies (FLUTTER) DEVI Use as directed 1 each 0  . sodium chloride (OCEAN) 0.65 % SOLN nasal spray Place 2 sprays into both nostrils daily as needed for congestion.      No current facility-administered medications for this visit.     Allergies:   Iohexol; Atorvastatin; Celecoxib; Clinoril [sulindac]; Nitrofurantoin; Spiriva handihaler [tiotropium bromide monohydrate]; and Sulfa antibiotics   Social History:  The patient  reports that he quit smoking about 18 years ago. His smoking use included cigarettes. He has a 79.50 pack-year smoking history. He has quit using smokeless tobacco. His smokeless tobacco use included snuff and chew. He reports that he does not drink alcohol or use drugs.   Family History:  The patient's family history includes Heart attack (age of onset: 52) in his mother; Prostate cancer (age of onset: 70) in his father; Stroke in his mother.    ROS:  Please see the history of present illness.   All other systems are reviewed and negative.    PHYSICAL EXAM: VS:  BP (!) 158/76   Pulse 90   Ht _0  (1.778 m)   Wt 281 lb 1.6 oz (127.5 kg)   SpO2 95%   BMI 40.33 kg/m  , BMI Body mass index is 40.33 kg/m. GEN: elderly, obese male, in no acute distress  HEENT: normal  Neck: no JVD, no masses. No carotid bruits Cardiac: RRR with distant heart sounds, 2/6 systolic murmur at the RUSB, no diastolic murmur             Respiratory:  rhonchi bilaterally, normal work of breathing GI: soft, nontender, nondistended, + BS, obese MS: no deformity or atrophy  Ext: trace bilateral pretibial edema Skin: warm and dry, no rash Neuro:  Strength and sensation are intact Psych: euthymic mood, full affect  EKG:  EKG is ordered today. The ekg ordered today shows normal sinus rhythm 90 bpm, incomplete right bundle branch block, possible septal infarct age undetermined.  Recent Labs: 12/02/2016: ALT 13 12/03/2016: BUN  17; Creatinine, Ser 0.75; Hemoglobin 12.5; Platelets 189; Potassium 3.4; Sodium 141   Lipid Panel     Component Value Date/Time   CHOL 184 11/14/2016 1048   TRIG 76.0 11/14/2016 1048   HDL 57.70 11/14/2016 1048   CHOLHDL 3 11/14/2016 1048   VLDL 15.2 11/14/2016 1048   LDLCALC 111 (H) 11/14/2016 1048      Wt Readings from Last 3 Encounters:  11/20/17 281 lb 1.6 oz (127.5 kg)  11/16/17 282 lb 9.6 oz (128.2 kg)  10/12/17 281 lb (127.5 kg)     Cardiac Studies  Reviewed: Echocardiogram 07/14/2016: Study Conclusions  - Left ventricle: The cavity size was normal. Wall thickness was   increased in a pattern of moderate LVH. Systolic function was   normal. The estimated ejection fraction was in the range of 60%   to 65%. Wall motion was normal; there were no regional wall   motion abnormalities. Doppler parameters are consistent with   abnormal left ventricular relaxation (grade 1 diastolic   dysfunction). Doppler parameters are consistent with high   ventricular filling pressure. - Aortic valve: A bioprosthesis was present. Valve area (VTI): 1.85   cm^2. Valve area (Vmax): 1.51 cm^2. Valve area (Vmean): 1.74   cm^2. - Mitral valve: Calcified annulus. There was mild regurgitation.   Valve area by pressure half-time: 2.5 cm^2. - Left atrium: The atrium was mildly dilated.  Impressions:  - Normal LV systolic function; moderate LVH; grade 1 diastolic   dysfunction; s/p TAVR with no AI and mean gradient 19 mmHg; mild   MR and mild LAE.  ASSESSMENT AND PLAN: 1.  Aortic valve disease status post TAVR: The patient has New York Heart Association functional class III symptoms of dyspnea, multifactorial as outlined above.  He has mildly elevated transvalvular gradients on his most recent echo.  Will order an updated echo study.  His exam is unchanged.  We discussed SBE prophylaxis today.  He will continue on his current medical therapy.  2.  Chronic diastolic heart failure: Discussion  as above.  Sodium restriction reviewed.  Medication compliance discussed and important to control his blood pressure.  He continues on daily diuretic therapy.  3.  Hypertension, uncontrolled: He has run out of metoprolol and had trouble getting it refilled.  His metoprolol and lisinopril prescriptions are updated today.  4.  Abdominal aortic aneurysm: 3.8 cm AAA noted on CT imaging in the past.  He is scheduled for an abdominal aortic duplex scan.  Current medicines are reviewed with the patient today.  The patient does not have concerns regarding medicines.  Labs/ tests ordered today include:   Orders Placed This Encounter  Procedures  . EKG 12-Lead  . ECHOCARDIOGRAM COMPLETE    Disposition:   FU one year  Signed, [name], MD  11/20/2017 5:30 PM    [address], [place], [place]  [address] Phone: [phone]; Fax: [phone]

## 2017-11-20 NOTE — Patient Instructions (Addendum)
Medication Instructions:  Your provider recommends that you continue on your current medications as directed. Please refer to the Current Medication list given to you today.    Labwork: None  Testing/Procedures: Your provider has requested that you have an echocardiogram in [place], the same day as CT scans. Echocardiography is a painless test that uses sound waves to create images of your heart. It provides your doctor with information about the size and shape of your heart and how well your heart's chambers and valves are working. This procedure takes approximately one hour. There are no restrictions for this procedure.  Follow-Up: Your provider wants you to follow-up in: 6 months with Dr. Burt Knack. You will receive a reminder letter in the mail two months in advance. If you don't receive a letter, please call our office to schedule the follow-up appointment.    Any Other Special Instructions Will Be Listed Below (If Applicable).     If you need a refill on your cardiac medications before your next appointment, please call your pharmacy.

## 2017-11-21 ENCOUNTER — Other Ambulatory Visit: Payer: Self-pay | Admitting: Family Medicine

## 2017-11-22 DIAGNOSIS — J449 Chronic obstructive pulmonary disease, unspecified: Secondary | ICD-10-CM | POA: Diagnosis not present

## 2017-11-23 ENCOUNTER — Ambulatory Visit: Payer: PPO

## 2017-11-23 NOTE — Telephone Encounter (Signed)
Electronic refill request Last office visit 01/20/17 Upcoming appointment 12/31/17 Last refill 11/28/16 #30/1

## 2017-11-24 NOTE — Telephone Encounter (Signed)
Sent. Thanks.   

## 2017-11-25 IMAGING — DX DG ABDOMEN 1V
2 series · 2 of 2 positions shown · non-contrast
Comparison: CT 05/22/2015 .

CLINICAL DATA: Right flank pain.

EXAM:
ABDOMEN - 1 VIEW

[abdomen kub (1 of 2)]
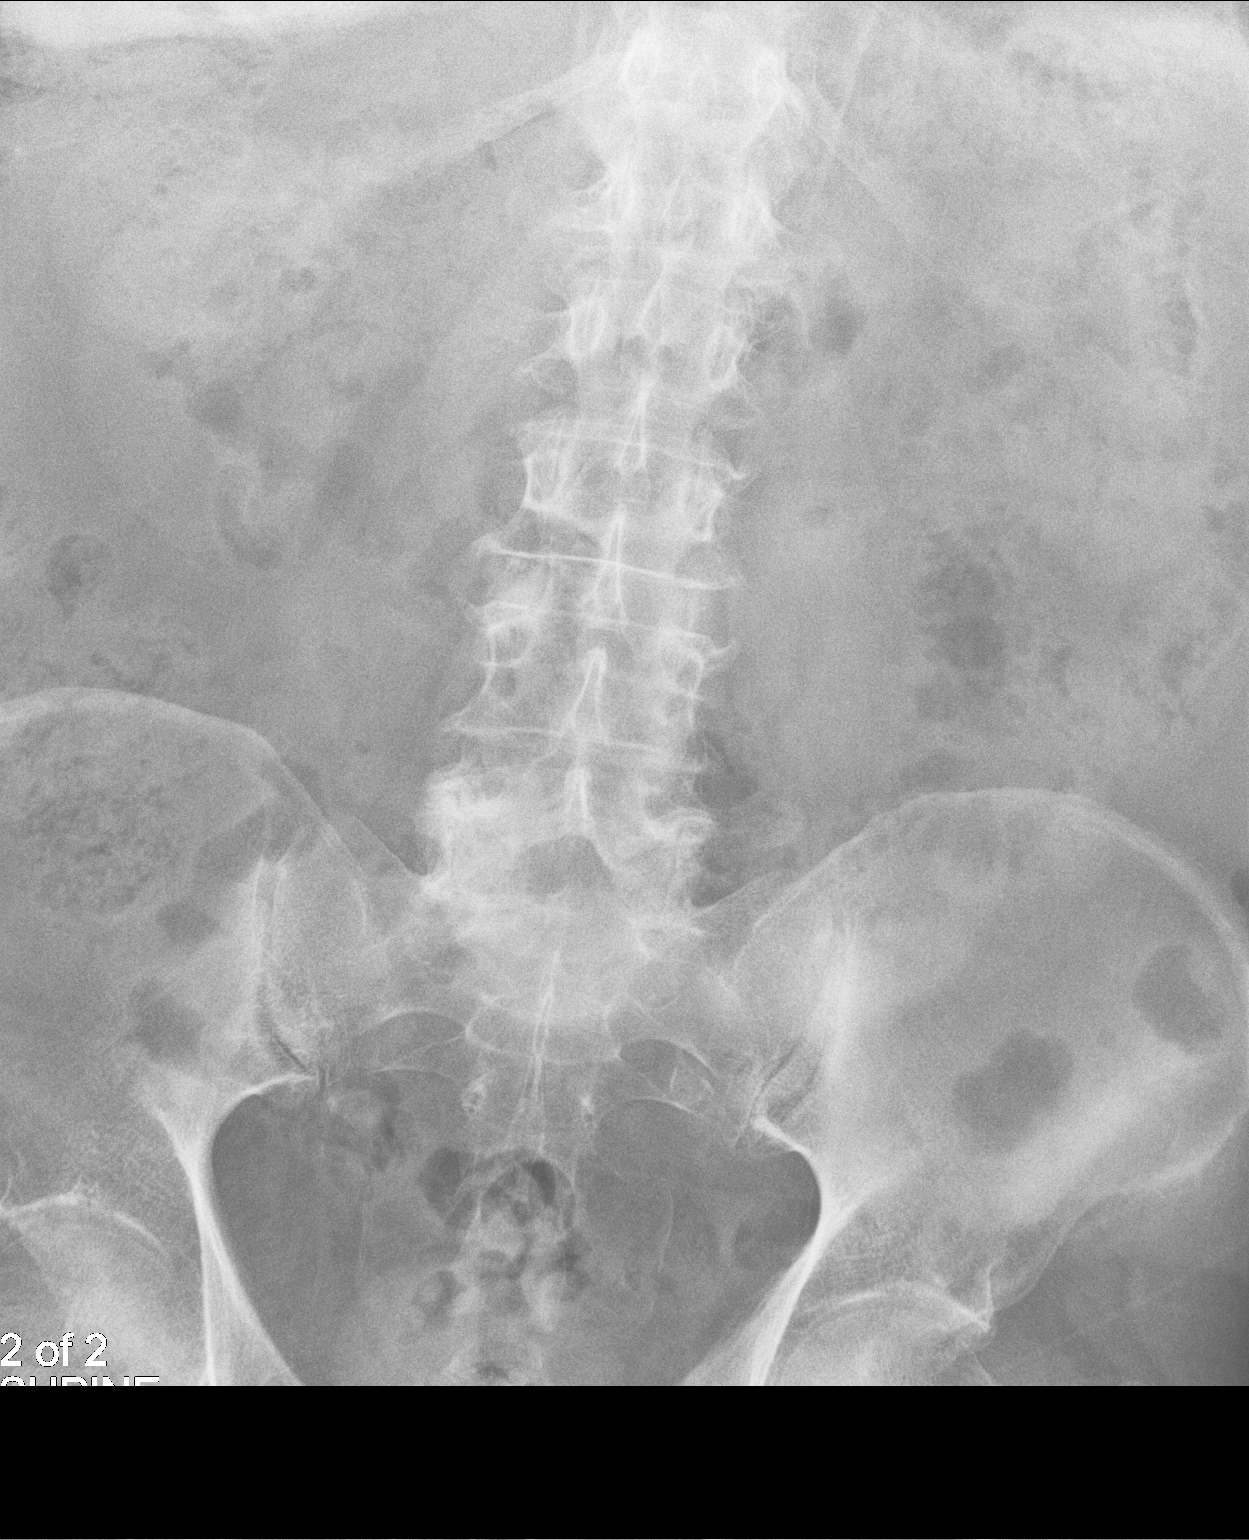

[abdomen kub (2 of 2)]
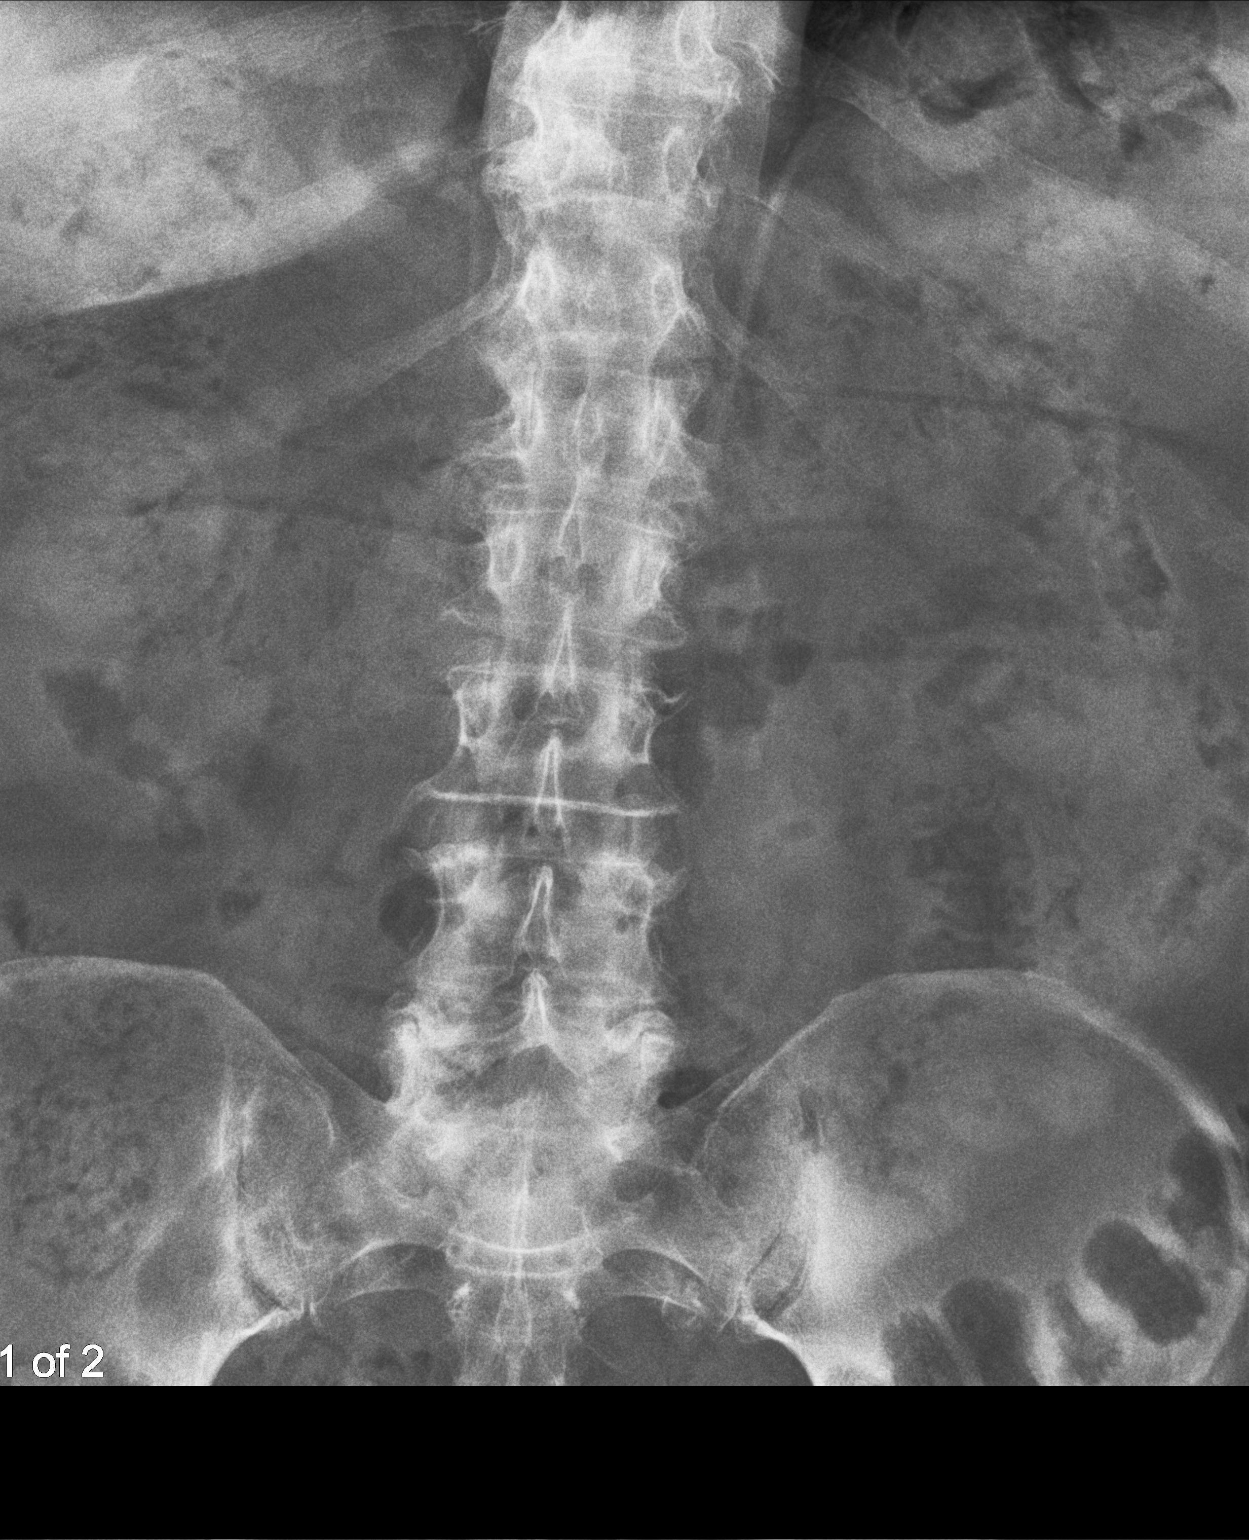

[2 of 2 positions shown; findings below may reference images not displayed]

FINDINGS: Soft structures are unremarkable. No bowel distention. Nonspecific
air-filled loops small bowel noted. No pathologic intra-abdominal
calcifications noted . Degenerative changes lumbar spine and both
hips. Aortoiliac atherosclerotic vascular disease.
IMPRESSION: 1. No evidence of nephrolithiasis or urolithiasis. Degenerative
changes lumbar spine and both hips.

2. Aortoiliac atherosclerotic vascular disease .

## 2017-11-26 ENCOUNTER — Other Ambulatory Visit: Payer: Self-pay

## 2017-11-26 ENCOUNTER — Ambulatory Visit (INDEPENDENT_AMBULATORY_CARE_PROVIDER_SITE_OTHER): Payer: PPO

## 2017-11-26 DIAGNOSIS — I350 Nonrheumatic aortic (valve) stenosis: Secondary | ICD-10-CM

## 2017-11-26 DIAGNOSIS — I714 Abdominal aortic aneurysm, without rupture, unspecified: Secondary | ICD-10-CM

## 2017-11-30 ENCOUNTER — Ambulatory Visit: Payer: PPO | Admitting: Family Medicine

## 2017-12-03 ENCOUNTER — Telehealth: Payer: Self-pay | Admitting: Cardiovascular Disease

## 2017-12-03 NOTE — Telephone Encounter (Signed)
LVM for return call. 

## 2017-12-03 NOTE — Telephone Encounter (Signed)
New message      Pt c/o BP issue: STAT if pt c/o blurred vision, one-sided weakness or slurred speech  1. What are your last 5 BP readings? 126/77 today  2. Are you having any other symptoms (ex. Dizziness, headache, blurred vision, passed out)? No  3. What is your BP issue? Patient states he stopped taking lisinopril (PRINIVIL,ZESTRIL) 10 MG tablet and metoprolol succinate (TOPROL-XL) 25 MG 24 hr tablet for 1 month.ago because BP was too low. Patient wants know if he should stay off these meds

## 2017-12-04 NOTE — Telephone Encounter (Signed)
Spoke with pt's wife, she is aware of recommendations and states pt will call if his BP begins to elevate.

## 2017-12-04 NOTE — Telephone Encounter (Signed)
Spoke with pt today. He states his BP the last few days have been "Normal." His BP this AM was 132/73, on 6/20 126/77. He states he has not taken his BP medications for 4 days because his pressure has "been way down." Pt could not give me his low BP readings. He states he is not going to take anymore blood pressure medications because they make him "feel bad and dizzy." I told him I would forward his concerns to Dr Antionette Char RN and Dr Burt Knack for further recommendation. However, he should take his BP every day this weekend. If his SBP > 140, he should take either his lisinopril or toprol. He stated he understood and had no additional questions.

## 2017-12-04 NOTE — Telephone Encounter (Signed)
This is fine thanks 

## 2017-12-07 IMAGING — CT CT RENAL STONE PROTOCOL
2 of 4 series · 16 of 46 positions shown, 18 images · non-contrast
Comparison: Abdominal CT dated 05/22/2015

CLINICAL DATA: 83-year-old male with weakness.

EXAM:
CT ABDOMEN AND PELVIS WITHOUT CONTRAST
TECHNIQUE: Multidetector CT imaging of the abdomen and pelvis was performed
following the standard protocol without IV contrast.

[Series 2: stone full standard · axial · 0.98mm/px · z∈[-826,-351]mm · 13 of 105 slices shown, 15 images]
[im 5/105  soft-tissue]
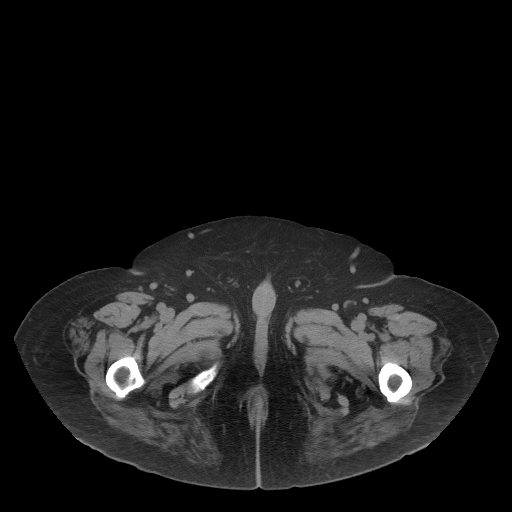
[im 5/105  bone]
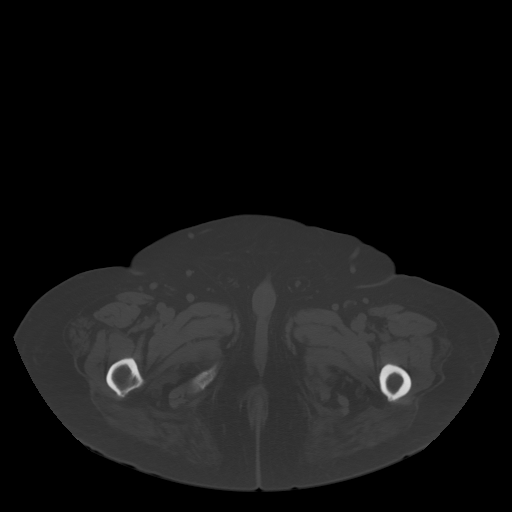
[im 13/105  soft-tissue]
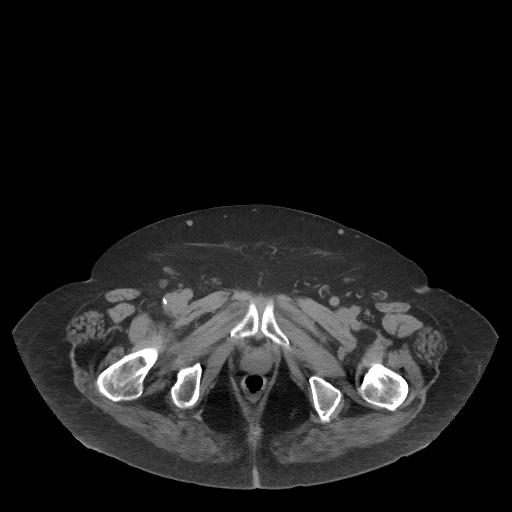
[im 21/105  soft-tissue]
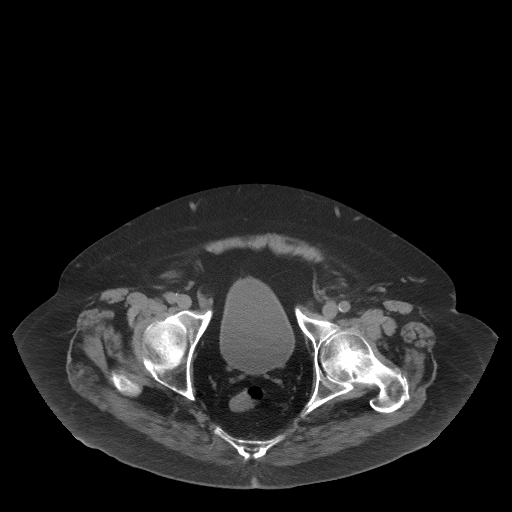
[im 30/105  soft-tissue]
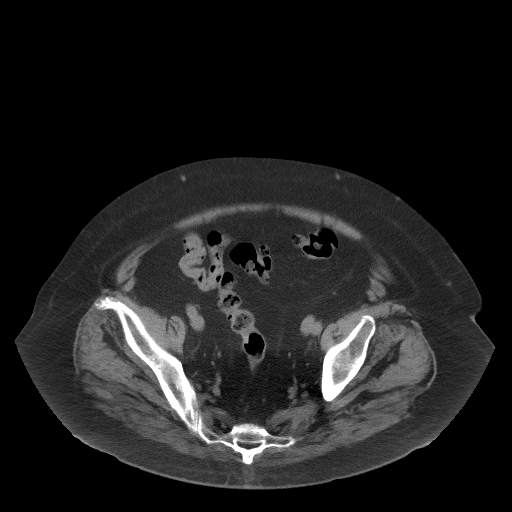
[im 38/105  soft-tissue]
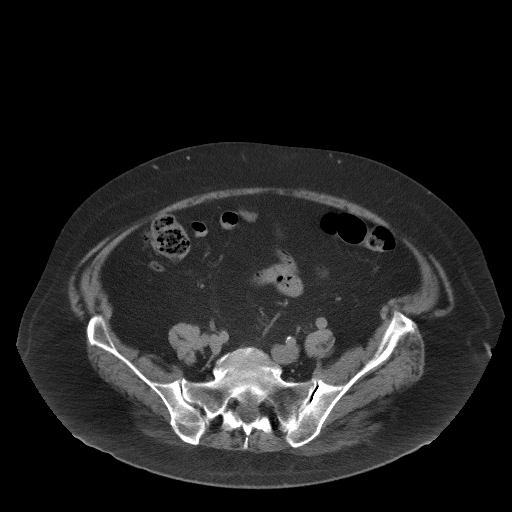
[im 46/105  soft-tissue]
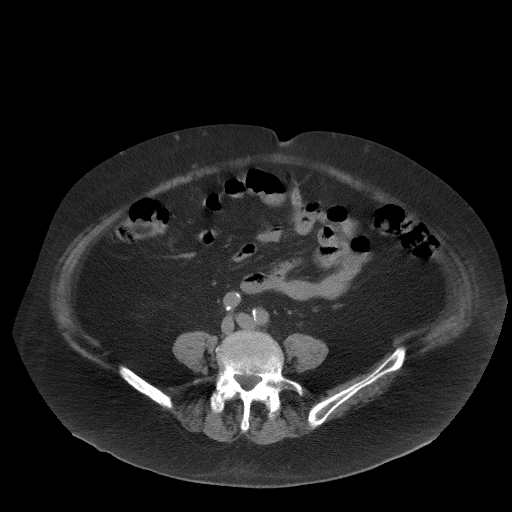
[im 55/105  soft-tissue]
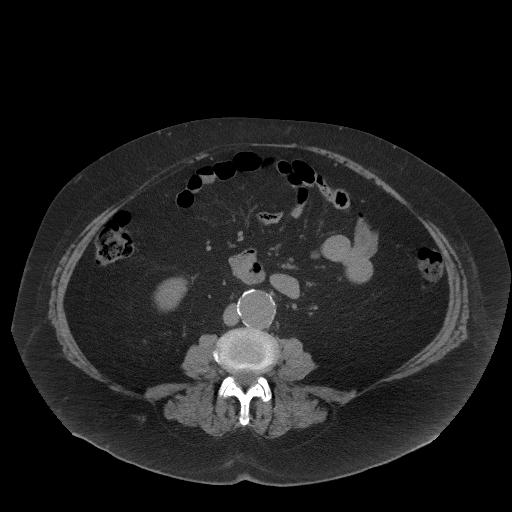
[im 59/105  soft-tissue]
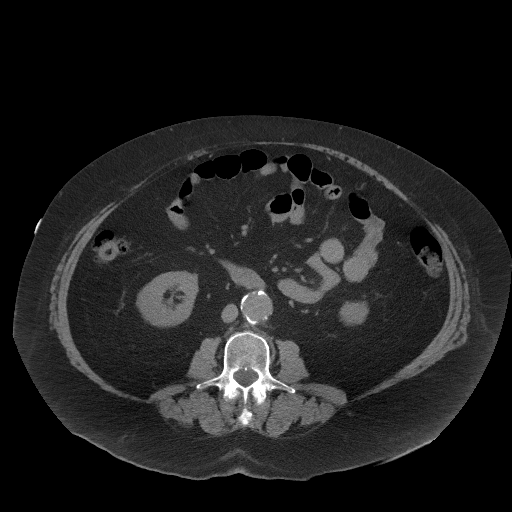
[im 67/105  soft-tissue]
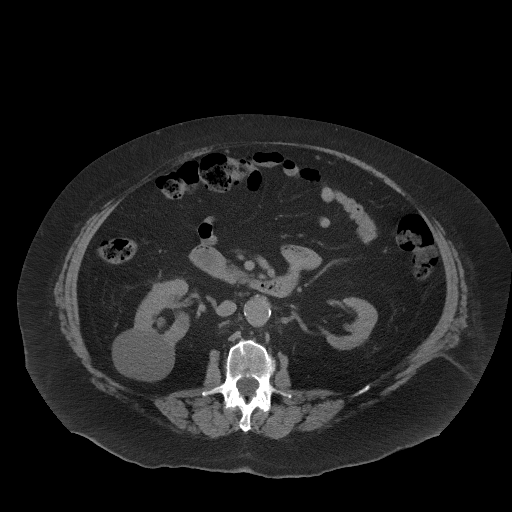
[im 67/105  bone]
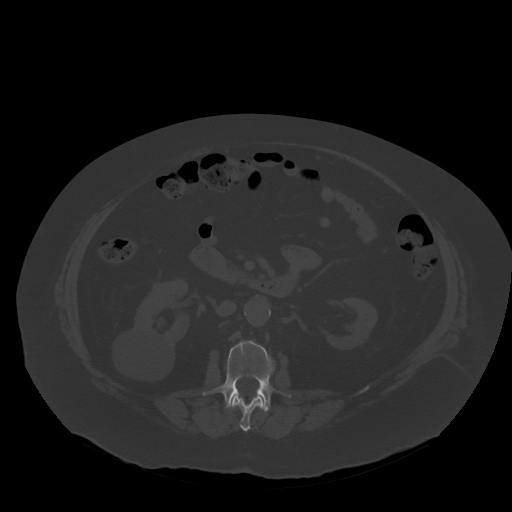
[im 75/105  soft-tissue]
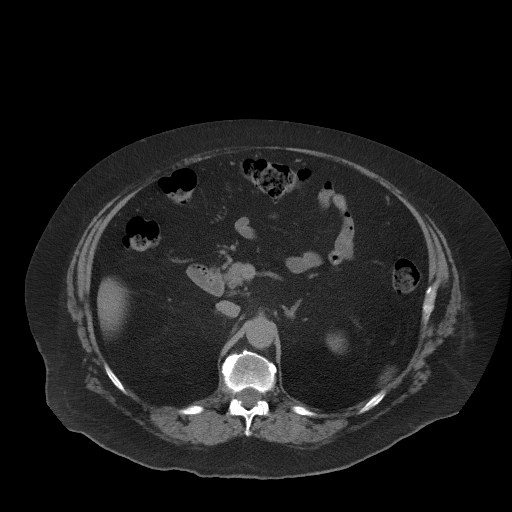
[im 84/105  soft-tissue]
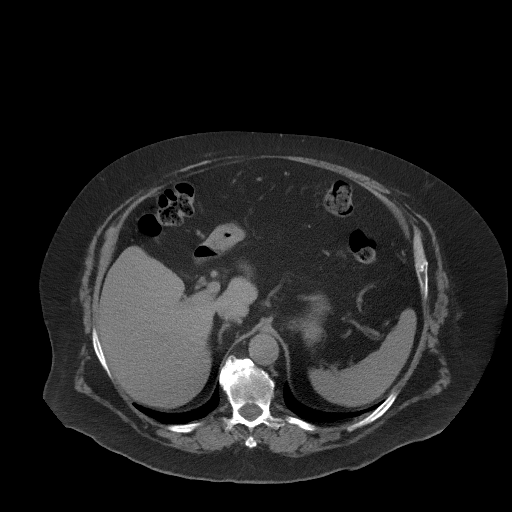
[im 92/105  soft-tissue]
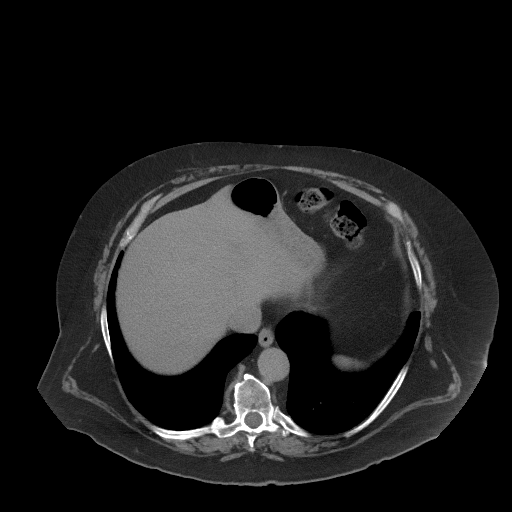
[im 100/105  soft-tissue]
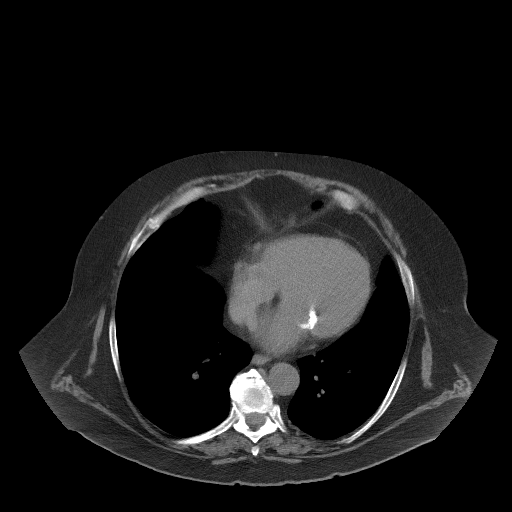

[Series 5: coronal · coronal · 0.96mm/px · 3 of 176 slices shown]
[im 59/176  soft-tissue]
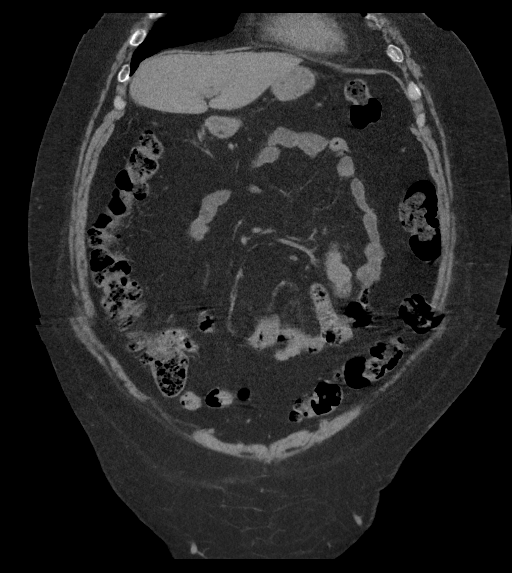
[im 78/176  soft-tissue]
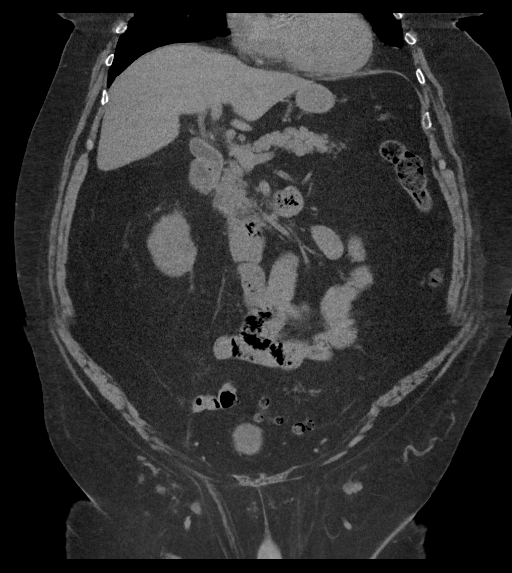
[im 98/176  soft-tissue]
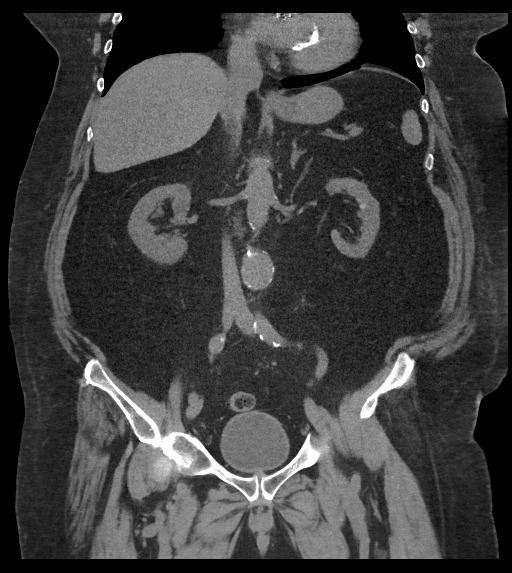

[16 of 46 positions shown; findings below may reference images not displayed]

FINDINGS: Lower chest: The visualized lung bases are clear. Aortic valve
replacement and calcification of the mitral annulus noted.

There is no intra-abdominal free air or free fluid.

Hepatobiliary: Cholecystectomy. There is fatty infiltration of the
liver. No intrahepatic biliary ductal dilatation.

Pancreas: Unremarkable. No pancreatic ductal dilatation or
surrounding inflammatory changes.

Spleen: Normal in size without focal abnormality.

Adrenals/Urinary Tract: The adrenal glands are unremarkable. There
are small nonobstructing bilateral renal calculi measuring up to 3
mm in the interpolar aspect of the left kidney. There is no
hydronephrosis or obstructing stone. There is mild bilateral renal
parenchymal atrophy. Right renal hypodense lesions are not
characterized on this noncontrast study but demonstrate fluid
attenuation and represent cysts as seen on the prior CT. The largest
cyst measures up to 6 cm in the upper pole of the right kidney. The
visualized ureters and urinary bladder appear unremarkable.

Stomach/Bowel: There is sigmoid diverticulosis without active
inflammatory changes. Moderate stool noted throughout the colon. No
evidence of bowel obstruction or active inflammation. Normal
appendix.

Vascular/Lymphatic: There is moderate aortoiliac atherosclerotic
disease. There is a 4.1 cm infrarenal aortic aneurysm previously
measuring 3.8 cm. A 3 cm infrarenal aortic aneurysm is also noted
more superiorly. The IVC appears unremarkable. Evaluation of the
vasculature is limited in the absence of intravenous contrast. No
portal venous gas identified. There is no adenopathy.

Reproductive: The prostate and seminal vesicles are grossly
unremarkable.

Other: None

Musculoskeletal: Degenerative changes of the spine. No acute
fracture.
IMPRESSION: 1. Small nonobstructing bilateral renal calculi. No hydronephrosis
or obstructing stone.
2. Fatty liver.
3. Colonic diverticulosis. No bowel obstruction or active
inflammation.
4.  Aortic Atherosclerosis (W2XSG-PRQ.Q).
5. Aortic aneurysm NOS (W2XSG-946.H). Recommend followup by
ultrasound in 1 year. This recommendation follows ACR consensus
guidelines: White Paper of the ACR Incidental Findings Committee II

## 2017-12-07 IMAGING — CR DG CHEST 2V
1 series · 3 of 3 positions shown · non-contrast
Comparison: Chest radiograph dated 06/27/2015

CLINICAL DATA: 83-year-old male with generalized pain and weakness.

EXAM:
CHEST  2 VIEW

[Series 1: dg chest 2 view · 0.14mm/px · 3 of 3 slices shown]
[im 1/3]
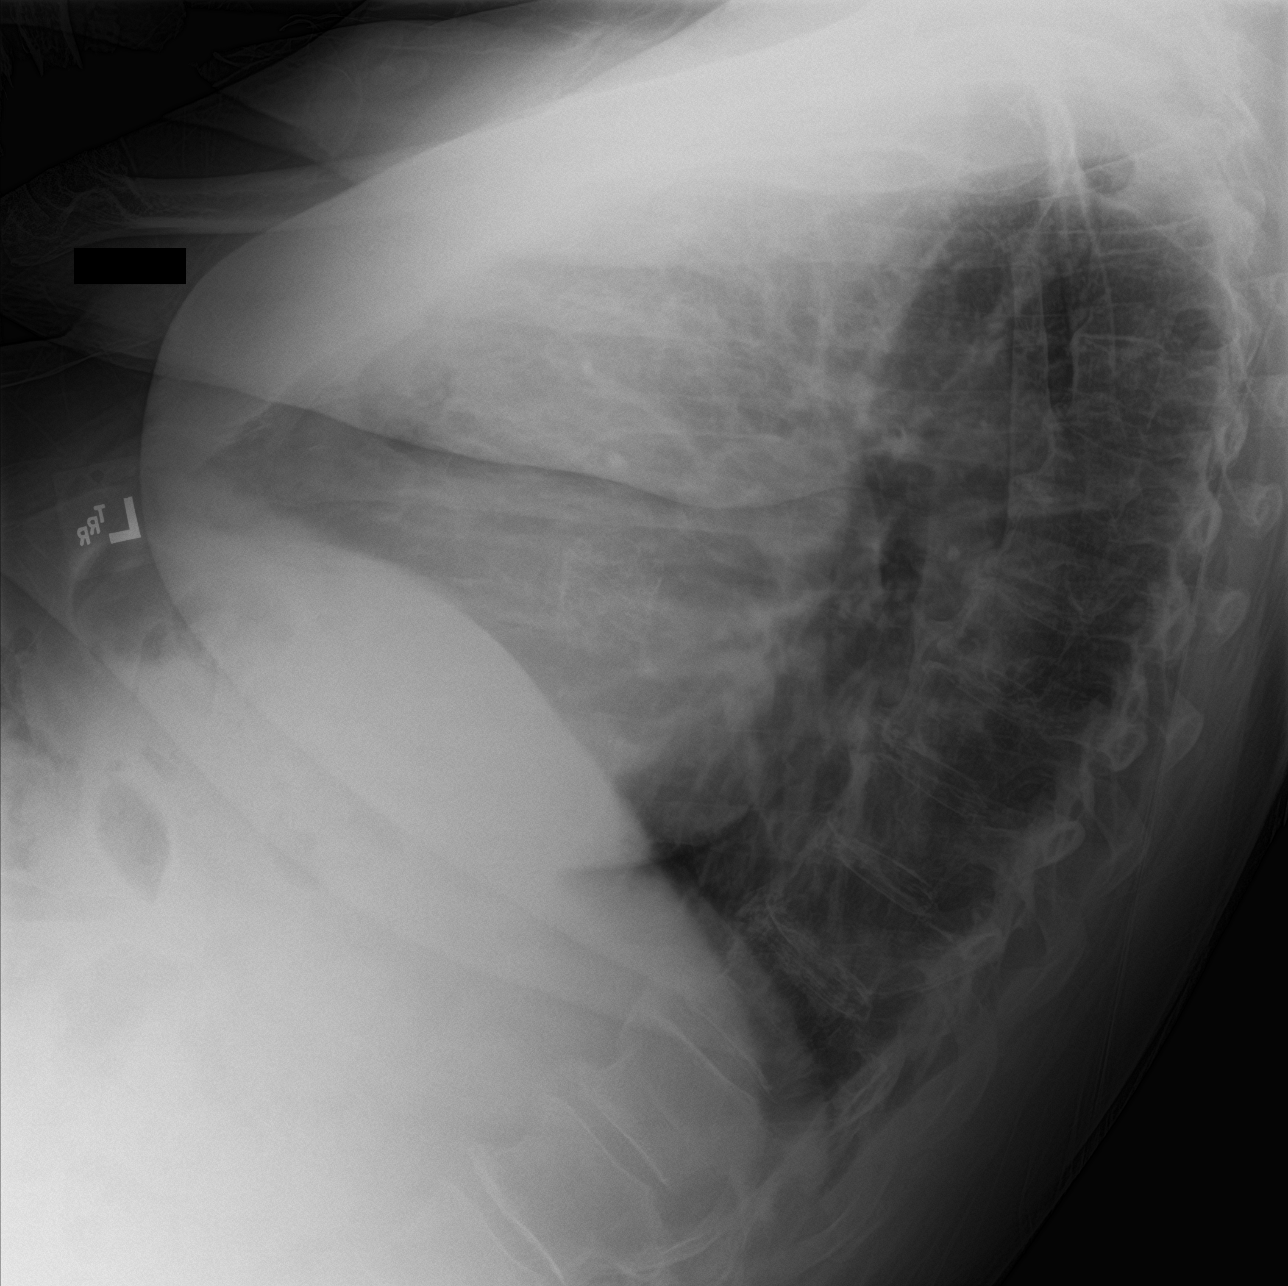
[im 2/3]
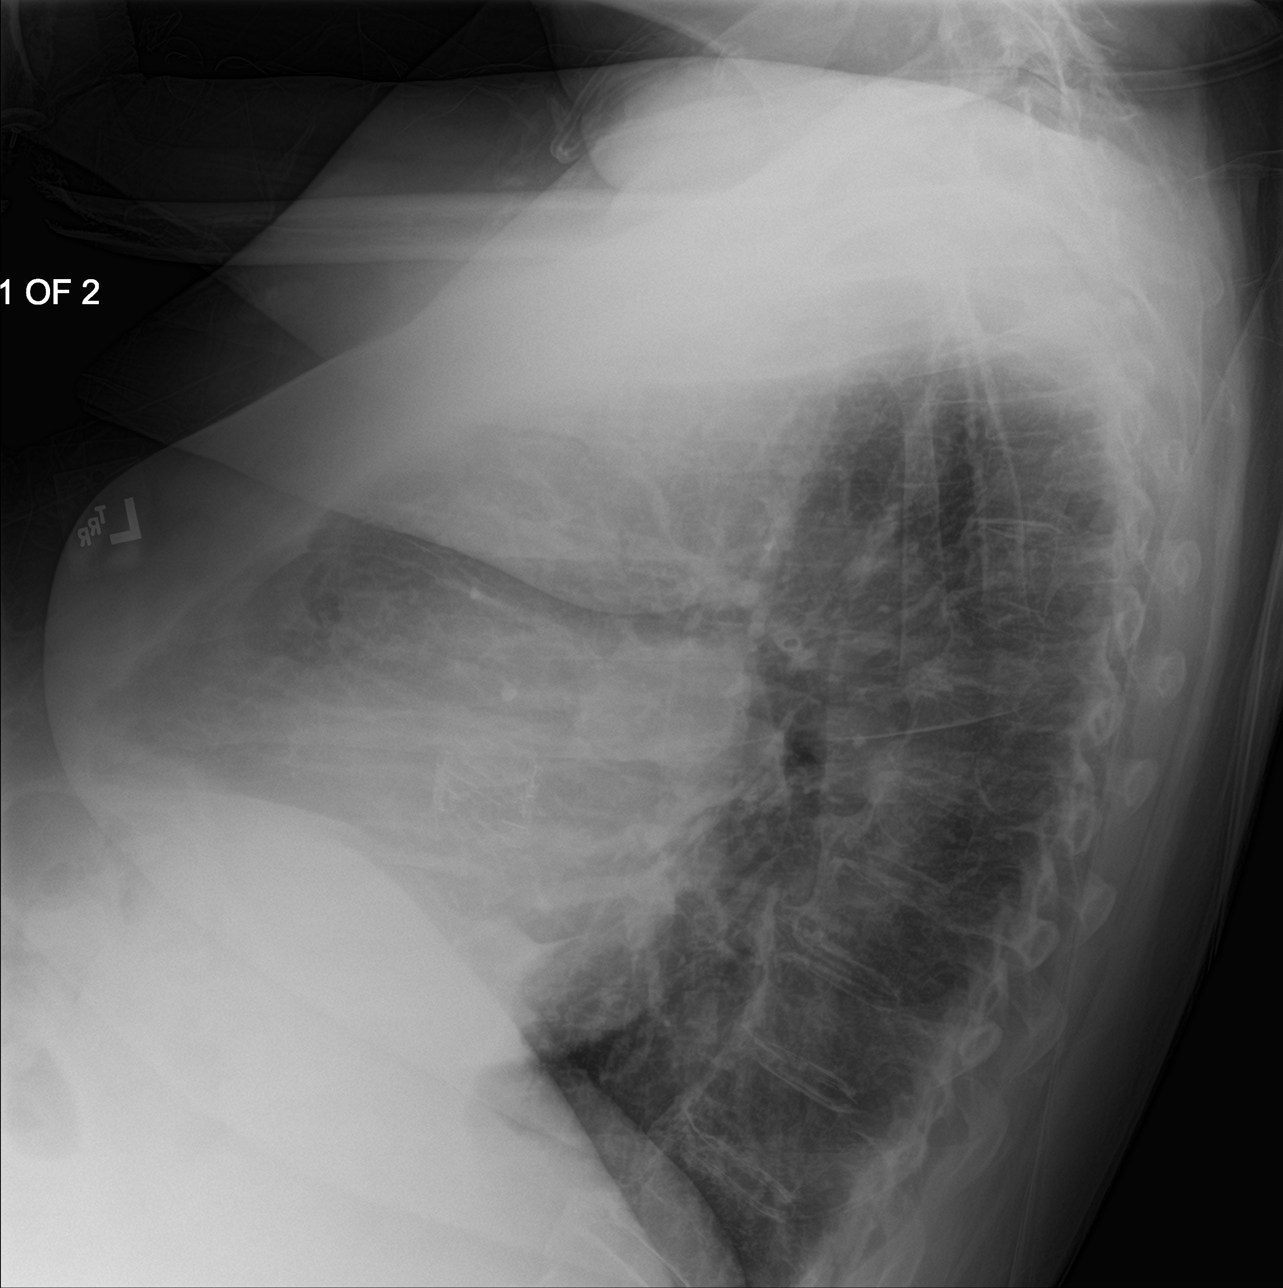
[im 3/3]
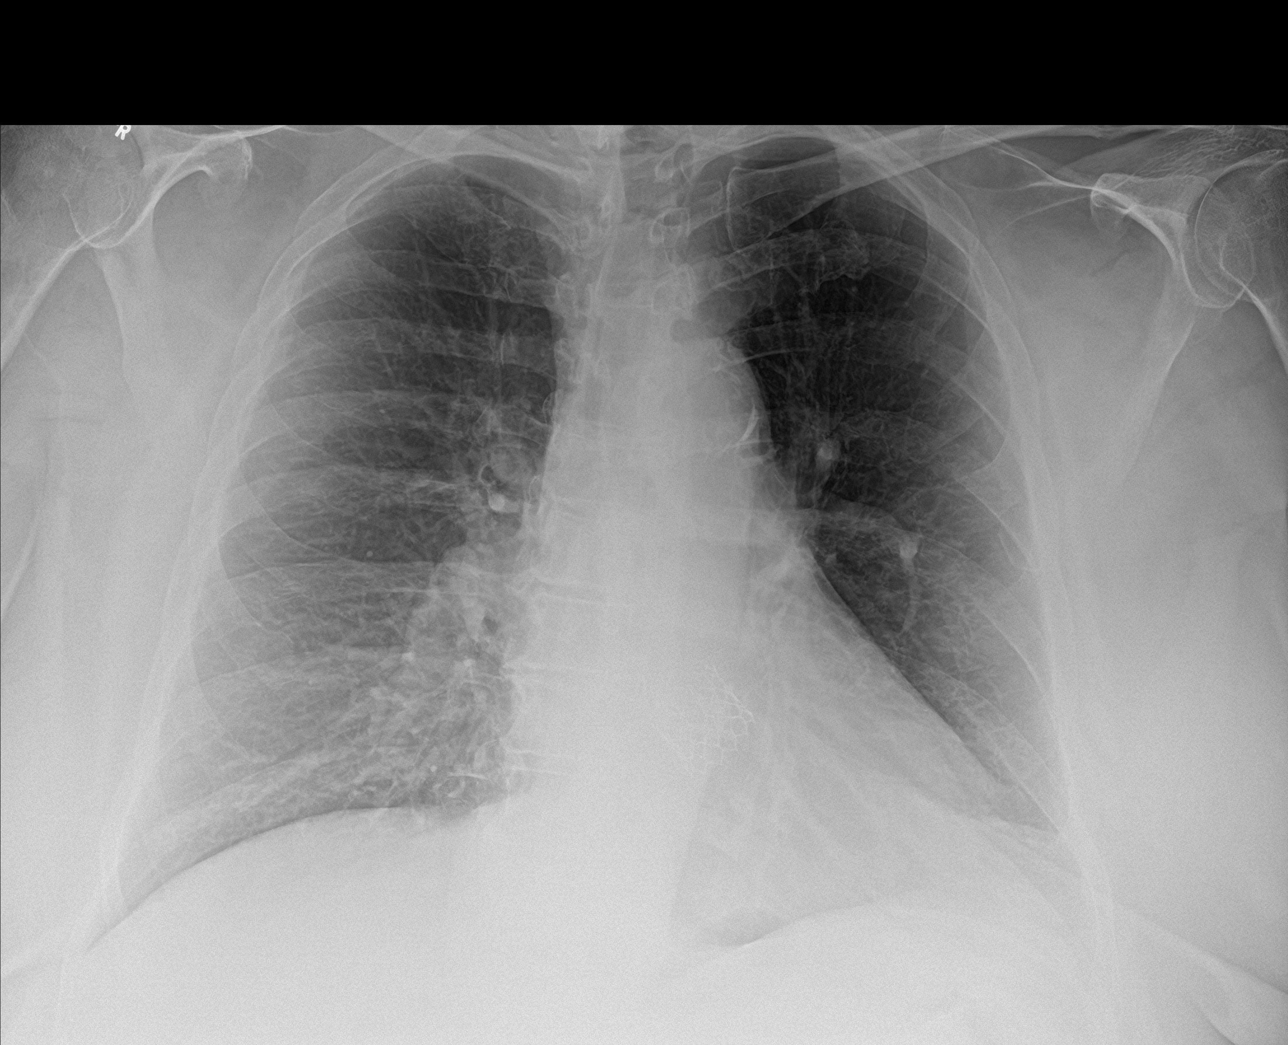

[3 of 3 positions shown; findings below may reference images not displayed]

FINDINGS: The lungs are clear. There is no pleural effusion or pneumothorax.
The cardiac silhouette is within normal limits. Aortic valve
replacement noted. There is atherosclerotic calcification of the
aortic arch. No acute osseous pathology.
IMPRESSION: No active cardiopulmonary disease.

## 2017-12-09 DIAGNOSIS — J449 Chronic obstructive pulmonary disease, unspecified: Secondary | ICD-10-CM | POA: Diagnosis not present

## 2017-12-10 ENCOUNTER — Telehealth: Payer: Self-pay | Admitting: Cardiovascular Disease

## 2017-12-10 NOTE — Telephone Encounter (Signed)
Spoke with patient who is reporting anytime he takes his BP medication his BP drops really low.  Yesterday his BP was 135/73 when he checked it.  He took 1/2 tablet of both Metoprolol succinate 25 mg and 1/2 tablet of Lisinopril 10 mg.  Today when he checked his BP around 12N it was 76/46.  He feels very weak. He has not taken any metoprolol or lisinopril today.  He has not rechecked his BP this afternoon b/c he states "I can't get that thing to work."    Advised to hold these 2 medications for now.  Advised if BP becomes elevated above 140/ he should only take one of the medications.  Of note, pt does report prior to his last office visit he had been taking Metoprolol Tartrate but when picked this RX up it is for succinate.  He is concern part of the issue is the different forms of metoprolol.  He doesn't know why it was changed from tartrate to succinate.  Aware I will forward to Dr Burt Knack for review and orders.  Knows to c/b if further concerns.

## 2017-12-10 NOTE — Telephone Encounter (Signed)
Pt called with a question about his medication please give him a call back.

## 2017-12-16 NOTE — Telephone Encounter (Signed)
Agree with holding medicines. Would be better to go back on metoprolol tartrate like he was taking before if he requires medication. For now, with such low BP, would stay off of BP medicine.

## 2017-12-16 NOTE — Telephone Encounter (Signed)
Patient's wife (DPR) reports he is not taking metoprolol or lisinopril and his BP is running fine (around 130/70). She understands to continue to hold medications, monitor BP, and call if BP is consistently over 140/90. If that occurs, Toprol 25 mg BID will be restarted.  She was grateful for call and agrees with treatment plan.   Med list updated.

## 2017-12-21 ENCOUNTER — Ambulatory Visit: Payer: PPO

## 2017-12-22 ENCOUNTER — Telehealth: Payer: Self-pay | Admitting: Pulmonary Disease

## 2017-12-22 DIAGNOSIS — G4733 Obstructive sleep apnea (adult) (pediatric): Secondary | ICD-10-CM

## 2017-12-22 DIAGNOSIS — J449 Chronic obstructive pulmonary disease, unspecified: Secondary | ICD-10-CM | POA: Diagnosis not present

## 2017-12-22 NOTE — Telephone Encounter (Signed)
ONO with CPAP and 5 liters 12/15/17 >> test time 9 hrs 38 min.  Basal SpO2 82.9%, low SpO2 63%.  Spent 195.3 min with SpO2 < 88%.   Please let him know that his oxygen level is still very low at night.  He needs to have an in lab titration study.  This should be start on CPAP 14 cm H2O with 4 liters oxygen, and then transition to Bipap as needed.

## 2017-12-22 NOTE — Telephone Encounter (Signed)
Called and spoke with patient regarding results.  Informed the patient of results and recommendations today. Placed order for in lab cpap titration study This should be start on CPAP 14 cm H2O with 4 liters oxygen, and then transition to Bipap as needed. Pt verbalized understanding and denied any questions or concerns at this time.  Nothing further needed.

## 2017-12-31 ENCOUNTER — Ambulatory Visit: Payer: PPO | Admitting: Family Medicine

## 2018-01-21 ENCOUNTER — Encounter (HOSPITAL_BASED_OUTPATIENT_CLINIC_OR_DEPARTMENT_OTHER): Payer: PPO

## 2018-01-22 DIAGNOSIS — J449 Chronic obstructive pulmonary disease, unspecified: Secondary | ICD-10-CM | POA: Diagnosis not present

## 2018-02-07 DIAGNOSIS — G4730 Sleep apnea, unspecified: Secondary | ICD-10-CM | POA: Diagnosis not present

## 2018-02-07 DIAGNOSIS — G4733 Obstructive sleep apnea (adult) (pediatric): Secondary | ICD-10-CM | POA: Diagnosis not present

## 2018-02-17 ENCOUNTER — Ambulatory Visit: Payer: PPO | Admitting: Nurse Practitioner

## 2018-02-17 ENCOUNTER — Ambulatory Visit (INDEPENDENT_AMBULATORY_CARE_PROVIDER_SITE_OTHER)
Admission: RE | Admit: 2018-02-17 | Discharge: 2018-02-17 | Disposition: A | Discharge status: Home / Self Care | Payer: PPO | Source: Ambulatory Visit | Attending: Nurse Practitioner | Admitting: Nurse Practitioner

## 2018-02-17 ENCOUNTER — Other Ambulatory Visit (INDEPENDENT_AMBULATORY_CARE_PROVIDER_SITE_OTHER): Payer: PPO

## 2018-02-17 ENCOUNTER — Encounter: Payer: Self-pay | Admitting: Nurse Practitioner

## 2018-02-17 VITALS — BP 142/82 | HR 88 | Ht 70.0 in | Wt 280.6 lb

## 2018-02-17 DIAGNOSIS — J438 Other emphysema: Secondary | ICD-10-CM | POA: Diagnosis not present

## 2018-02-17 DIAGNOSIS — G4733 Obstructive sleep apnea (adult) (pediatric): Secondary | ICD-10-CM

## 2018-02-17 DIAGNOSIS — R0602 Shortness of breath: Secondary | ICD-10-CM | POA: Diagnosis not present

## 2018-02-17 LAB — CBC WITH DIFFERENTIAL/PLATELET
BASOS ABS: 0.1 10*3/uL (ref 0.0–0.1)
BASOS PCT: 1.1 % (ref 0.0–3.0)
EOS ABS: 0.2 10*3/uL (ref 0.0–0.7)
EOS PCT: 2.5 % (ref 0.0–5.0)
HCT: 37.4 % — ABNORMAL LOW (ref 39.0–52.0)
HEMOGLOBIN: 12.3 g/dL — AB (ref 13.0–17.0)
LYMPHS ABS: 1.5 10*3/uL (ref 0.7–4.0)
Lymphocytes Relative: 20.8 % (ref 12.0–46.0)
MCHC: 32.8 g/dL (ref 30.0–36.0)
MCV: 93.6 fl (ref 78.0–100.0)
MONO ABS: 0.6 10*3/uL (ref 0.1–1.0)
Monocytes Relative: 7.8 % (ref 3.0–12.0)
NEUTROS ABS: 4.9 10*3/uL (ref 1.4–7.7)
Neutrophils Relative %: 67.8 % (ref 43.0–77.0)
Platelets: 201 10*3/uL (ref 150.0–400.0)
RBC: 3.99 Mil/uL — ABNORMAL LOW (ref 4.22–5.81)
RDW: 13.9 % (ref 11.5–15.5)
WBC: 7.3 10*3/uL (ref 4.0–10.5)

## 2018-02-17 LAB — BASIC METABOLIC PANEL
BUN: 26 mg/dL — AB (ref 6–23)
CO2: 37 meq/L — AB (ref 19–32)
Calcium: 9.1 mg/dL (ref 8.4–10.5)
Chloride: 100 mEq/L (ref 96–112)
Creatinine, Ser: 1.23 mg/dL (ref 0.40–1.50)
GFR: 59.81 mL/min — ABNORMAL LOW (ref >60.00)
GLUCOSE: 86 mg/dL (ref 70–99)
POTASSIUM: 4.9 meq/L (ref 3.5–5.1)
Sodium: 141 mEq/L (ref 135–145)

## 2018-02-17 MED ORDER — FLUTICASONE-UMECLIDIN-VILANT 100-62.5-25 MCG/INH IN AEPB: 1.0000 | INHALATION_SPRAY | Freq: Every day | RESPIRATORY_TRACT | 0 refills | Status: DC

## 2018-02-17 NOTE — Assessment & Plan Note (Signed)
Refuses CPAP titration Continue CPAP at current settings Do not drive if sleepy Work on weight loss

## 2018-02-17 NOTE — Assessment & Plan Note (Signed)
Continue O2 at 5L Will start trelegy

## 2018-02-17 NOTE — Progress Notes (Signed)
_0  ID: [name] [name], male    DOB: Nov 29, 1933, 84 y.o.   MRN: [id number]  Chief Complaint  Patient presents with  . Shortness of Breath    Not able to get around like before for the last month. Increased O2 to 5L for the last month, not seeing any improvement.    Referring provider: Tonia Ghent, MD  HPI   84 year old male former smoker with OSA, COPD, and chronic respiratory failure followed by Dr. Halford Chessman.     Tests:  Sleep tests: PSG 12/13/99 >> AHI 33 CPAP 10/17/17 to 11/15/17 >> used on 30 of 30 nights with average 10 hrs 59 min.  Average AHI 9.8 with CPAP 12 cm H2O  Pulmonary tests Spirometry 04/11/15 >> FEV1 0.77 (24%), FEV1% 50 CT chest 05/22/15 >> 4 mm RUL nodule  Cardiac tests Rt/Lt heart cath 05/15/15 >> severe AS, mild pulmonary hypertension Echo 07/13/16 >> mod LVH, EF 60 to 65%, grade 1 DD, bioprosthetic AV, mild MR  OV 02/17/18 - Acute - shortness of breath Patient presents today with shortness of breath. He has COPD and did not follow up recently for PFT. He states that he has noticed over the past month that he is more short of breath. Denies any fever, cough, peripheral edema, or chest pain. He is on 5L [place] 24/7. Uses CPAP at night. Refused CPAP titration study recently. He currently uses Symbicort daily. He has refused Trelegy in the past, but states that now he is willing to try it.       Allergies  Allergen Reactions  . Iohexol Hives and Swelling    Pt reports swelling, redness, hives, and blisters   . Atorvastatin     REACTION: aches  . Celecoxib     REACTION: rash  . Clinoril [Sulindac]     REACTION: rash  . Nitrofurantoin   . Spiriva Handihaler [Tiotropium Bromide Monohydrate] Other (See Comments)    Dry mouth  . Sulfa Antibiotics     Intolerant but unrecalled.     Immunization History  Administered Date(s) Administered  . Influenza Split 06/17/2011, 02/23/2012  . Influenza Whole 04/27/2006, 04/10/2009, 04/10/2010  . Influenza,  Seasonal, Injecte, Preservative Fre 06/13/2016  . Influenza,inj,Quad PF,6+ Mos 03/17/2013, 04/24/2014, 03/17/2015, 07/15/2016  . Pneumococcal Polysaccharide-23 02/15/1999, 10/20/2011  . Td 02/05/2006, 01/08/2017  . Zoster 01/15/2009    Past Medical History:  Diagnosis Date  . AAA (abdominal aortic aneurysm) (HCC)    3.8 cm CTA 05/2015  . Anxiety   . COPD (chronic obstructive pulmonary disease) (HCC)    on home O2 4LPM  . Diverticulosis   . Former smoker    quit 2001  . Hemorrhoids   . Kidney stones    passed spontaneously  . OSA on CPAP    last study- in the home possibility- 2015, pt. unsure   . Prediabetes    Hgb A1C 5.8 in Jan 2017  . PUD (peptic ulcer disease)   . Right knee DJD 10/31/2011   Severe  . S/P TAVR (transcatheter aortic valve replacement) 06/26/2015   26 mm Edwards Sapien 3 transcatheter heart valve placed via percutaneous right transfemoral approach  . Severe aortic stenosis    a. Echo 11/16:  Mod LVH, EF 55-60%, no RWMA, Gr 1 DD, severe AS (mean 51 mmHg; peak 86 mmHg), MAC, mild LAE  b. s/p TAVR on 06/26/2015 with 107m Edward Sapien 3 THV  . Shortness of breath dyspnea     Tobacco History: Social  History   Tobacco Use  Smoking Status Former Smoker  . Packs/day: 1.50  . Years: 53.00  . Pack years: 79.50  . Types: Cigarettes  . Last attempt to quit: 06/23/1999  . Years since quitting: 18.6  Smokeless Tobacco Former Systems developer  . Types: Snuff, Chew   Counseling given: Not Answered   Outpatient Encounter Medications as of 02/17/2018  Medication Sig  . albuterol (PROAIR HFA) 108 (90 Base) MCG/ACT inhaler INHALE 1-2 PUFFS INTO THE LUNGS EVERY 6 (SIX) HOURS AS NEEDED FOR WHEEZING.  Marland Kitchen aspirin EC 81 MG tablet Take 1 tablet (81 mg total) by mouth daily.  . budesonide-formoterol (SYMBICORT) 160-4.5 MCG/ACT inhaler Inhale 2 puffs into the lungs 2 (two) times daily.  . fluocinonide cream (LIDEX) 2.50 % Apply 1 application topically 2 (two) times daily as needed. Use  sparingly.  . furosemide (LASIX) 20 MG tablet Take 1 tablet (20 mg total) by mouth daily.  Marland Kitchen guaiFENesin (MUCINEX) 600 MG 12 hr tablet Take 600 mg by mouth 2 (two) times daily.   Marland Kitchen ibuprofen (ADVIL,MOTRIN) 200 MG tablet Take 200 mg by mouth every 6 (six) hours as needed (pain). Reported on 07/30/2015  . LORazepam (ATIVAN) 1 MG tablet TAKE 1/4 TO 1/2 TABLET BY MOUTH EVERY 6 HOURS AS NEEDED  . OXYGEN Inhale into the lungs. 4 Liters  . polyethylene glycol (MIRALAX / GLYCOLAX) packet Take 17 g by mouth daily as needed (constiptation).  Marland Kitchen Respiratory Therapy Supplies (FLUTTER) DEVI Use as directed  . sodium chloride (OCEAN) 0.65 % SOLN nasal spray Place 2 sprays into both nostrils daily as needed for congestion.   . Fluticasone-Umeclidin-Vilant (TRELEGY ELLIPTA) 100-62.5-25 MCG/INH AEPB Inhale 1 puff into the lungs daily.   No facility-administered encounter medications on file as of 02/17/2018.      Review of Systems  Review of Systems  Constitutional: Positive for fever. Negative for chills.  HENT: Negative.   Respiratory: Positive for shortness of breath. Negative for cough.   Cardiovascular: Negative.  Negative for leg swelling.  Gastrointestinal: Negative.   Allergic/Immunologic: Negative.   Neurological: Negative.   Psychiatric/Behavioral: Negative.        Physical Exam  BP (!) 142/82 (BP Location: Left Arm, Patient Position: Sitting, Cuff Size: Normal)   Pulse 88   Ht _0  (1.778 m)   Wt 280 lb 9.6 oz (127.3 kg)   SpO2 93% Comment: On 5L of O2  BMI 40.26 kg/m   Wt Readings from Last 5 Encounters:  02/17/18 280 lb 9.6 oz (127.3 kg)  11/20/17 281 lb 1.6 oz (127.5 kg)  11/16/17 282 lb 9.6 oz (128.2 kg)  10/12/17 281 lb (127.5 kg)  01/20/17 273 lb (123.8 kg)     Physical Exam  Constitutional: He is oriented to person, place, and time. He appears well-developed and well-nourished. No distress.  Cardiovascular: Normal rate and regular rhythm.  Pulmonary/Chest: Effort  normal and breath sounds normal.  Neurological: He is alert and oriented to person, place, and time.  Skin: Skin is warm and dry.  Psychiatric: He has a normal mood and affect.  Nursing note and vitals reviewed.     Assessment & Plan:   COPD (chronic obstructive pulmonary disease) with emphysema (HCC) Worsening symptoms/shortness of breath Continue 5L O2 to maintain 88% sats Patient Instructions  Will order chest x ray Will order labs and call with results Will order PFT Will start on trelegy May stop symbicort Follow up in 4 weeks with PFT before with Dr. Halford Chessman Please  call if symptoms worsen    Chronic respiratory failure, unspecified whether with hypoxia or hypercapnia Continue O2 at 5L Will start trelegy  OSA (obstructive sleep apnea) Refuses CPAP titration Continue CPAP at current settings Do not drive if sleepy Work on weight loss     Fenton Foy, NP 02/17/2018

## 2018-02-17 NOTE — Assessment & Plan Note (Signed)
Worsening symptoms/shortness of breath Continue 5L O2 to maintain 88% sats Patient Instructions  Will order chest x ray Will order labs and call with results Will order PFT Will start on trelegy May stop symbicort Follow up in 4 weeks with PFT before with Dr. Halford Chessman Please call if symptoms worsen

## 2018-02-17 NOTE — Patient Instructions (Addendum)
Will order chest x ray Will order labs and call with results Will order PFT Will start on trelegy May stop symbicort Follow up in 4 weeks with PFT before with Dr. Halford Chessman Please call if symptoms worsen

## 2018-02-18 NOTE — Progress Notes (Signed)
Reviewed and agree with assessment/plan.   Veida Spira, MD [facility] 06/11/2016, 12:24 PM Pager:  [phone]  

## 2018-02-22 DIAGNOSIS — J449 Chronic obstructive pulmonary disease, unspecified: Secondary | ICD-10-CM | POA: Diagnosis not present

## 2018-03-04 ENCOUNTER — Ambulatory Visit: Payer: PPO | Admitting: Family Medicine

## 2018-03-04 ENCOUNTER — Telehealth: Payer: Self-pay | Admitting: Pulmonary Disease

## 2018-03-04 DIAGNOSIS — J438 Other emphysema: Secondary | ICD-10-CM

## 2018-03-04 MED ORDER — FLUTICASONE-UMECLIDIN-VILANT 100-62.5-25 MCG/INH IN AEPB: 1.0000 | INHALATION_SPRAY | Freq: Every day | RESPIRATORY_TRACT | 0 refills | Status: DC

## 2018-03-04 NOTE — Telephone Encounter (Signed)
Called and spoke with patients wife, she stated that patient was given a sample of the trelegy. Patient did well on medication and would like the prescription. Refill sent. Nothing further needed.

## 2018-03-09 ENCOUNTER — Telehealth: Payer: Self-pay

## 2018-03-09 ENCOUNTER — Ambulatory Visit: Payer: PPO

## 2018-03-09 NOTE — Telephone Encounter (Signed)
Copied from [place] [id number]. Topic: Appointment Scheduling - Scheduling Inquiry for Clinic >> Mar 08, 2018  8:53 AM Conception Chancy, NT wrote: Reason for CRM: patient and spouse had to cancel physical appointment due to a death in the family. They would like to reschedule for back to back appointments. They have a wellness visit on 03/26/18. There is nothing in the time frame for back to back appointments after that visit. Please contact to schedule.   it ok to find a spot for CPE for patient and his spouse on a day and book it back to back?-Anastasiya [employer], RMA

## 2018-03-10 NOTE — Telephone Encounter (Signed)
Please give my condolences about the death in the family.   Okay to try to schedule back to back visits with me on a day other than 03/26/18.   Please get 29min visits for each of them.  Thanks.

## 2018-03-10 NOTE — Telephone Encounter (Signed)
Left message to call back, looking at 04/05/18 if they can come-Anastasiya V Hopkins, RMA

## 2018-03-16 ENCOUNTER — Encounter: Payer: PPO | Admitting: Family Medicine

## 2018-03-16 NOTE — Telephone Encounter (Signed)
Left message to call back-Anastasiya Estell Harpin, RMA

## 2018-03-17 ENCOUNTER — Ambulatory Visit: Payer: PPO | Admitting: Nurse Practitioner

## 2018-03-19 NOTE — Telephone Encounter (Signed)
Added note to appt for 03/26/18 to have pt scheduled for CPE. I have not heard back from [employer], RMA

## 2018-03-24 DIAGNOSIS — J449 Chronic obstructive pulmonary disease, unspecified: Secondary | ICD-10-CM | POA: Diagnosis not present

## 2018-03-26 ENCOUNTER — Ambulatory Visit (INDEPENDENT_AMBULATORY_CARE_PROVIDER_SITE_OTHER): Payer: PPO

## 2018-03-26 VITALS — BP 140/70 | HR 82 | Temp 98.7°F | Ht 70.0 in | Wt 283.8 lb

## 2018-03-26 DIAGNOSIS — Z23 Encounter for immunization: Secondary | ICD-10-CM | POA: Diagnosis not present

## 2018-03-26 DIAGNOSIS — I10 Essential (primary) hypertension: Secondary | ICD-10-CM | POA: Diagnosis not present

## 2018-03-26 DIAGNOSIS — Z0000 Encounter for general adult medical examination without abnormal findings: Secondary | ICD-10-CM | POA: Diagnosis not present

## 2018-03-26 DIAGNOSIS — R7309 Other abnormal glucose: Secondary | ICD-10-CM | POA: Diagnosis not present

## 2018-03-26 DIAGNOSIS — E559 Vitamin D deficiency, unspecified: Secondary | ICD-10-CM | POA: Diagnosis not present

## 2018-03-26 LAB — HEMOGLOBIN A1C: HEMOGLOBIN A1C: 5.3 % (ref 4.6–6.5)

## 2018-03-26 LAB — COMPREHENSIVE METABOLIC PANEL
ALT: 9 U/L (ref 0–53)
AST: 13 U/L (ref 0–37)
Albumin: 3.8 g/dL (ref 3.5–5.2)
Alkaline Phosphatase: 88 U/L (ref 39–117)
BUN: 17 mg/dL (ref 6–23)
CALCIUM: 9.2 mg/dL (ref 8.4–10.5)
CHLORIDE: 99 meq/L (ref 96–112)
CO2: 40 meq/L — AB (ref 19–32)
Creatinine, Ser: 1.04 mg/dL (ref 0.40–1.50)
GFR: 72.57 mL/min (ref >60.00)
GLUCOSE: 105 mg/dL — AB (ref 70–99)
POTASSIUM: 4.5 meq/L (ref 3.5–5.1)
Sodium: 143 mEq/L (ref 135–145)
Total Bilirubin: 0.9 mg/dL (ref 0.2–1.2)
Total Protein: 6.6 g/dL (ref 6.0–8.3)

## 2018-03-26 LAB — LIPID PANEL
CHOL/HDL RATIO: 4
Cholesterol: 171 mg/dL (ref 0–200)
HDL: 48 mg/dL (ref >39.00)
LDL CALC: 99 mg/dL (ref 0–99)
NONHDL: 123.34
TRIGLYCERIDES: 121 mg/dL (ref 0.0–149.0)
VLDL: 24.2 mg/dL (ref 0.0–40.0)

## 2018-03-26 LAB — VITAMIN D 25 HYDROXY (VIT D DEFICIENCY, FRACTURES): VITD: 10.61 ng/mL — ABNORMAL LOW (ref 30.00–100.00)

## 2018-03-26 NOTE — Patient Instructions (Signed)
[name] [name] , Thank you for taking time to come for your Medicare Wellness Visit. I appreciate your ongoing commitment to your health goals. Please review the following plan we discussed and let me know if I can assist you in the future.   These are the goals we discussed: Goals    . Increase physical activity     Starting 03/26/2018, I will continue to do arm and leg exercises for 30 min 4-5 days per week.        This is a list of the screening recommended for you and due dates:  Health Maintenance  Topic Date Due  . Flu Shot  01/14/2018  . Colon Cancer Screening  11/14/2025*  . Tetanus Vaccine  01/09/2027  . Pneumonia vaccines  Completed  *Topic was postponed. The date shown is not the original due date.   Preventive Care for Adults  A healthy lifestyle and preventive care can promote health and wellness. Preventive health guidelines for adults include the following key practices.  . A routine yearly physical is a good way to check with your health care provider about your health and preventive screening. It is a chance to share any concerns and updates on your health and to receive a thorough exam.  . Visit your dentist for a routine exam and preventive care every 6 months. Brush your teeth twice a day and floss once a day. Good oral hygiene prevents tooth decay and gum disease.  . The frequency of eye exams is based on your age, health, family medical history, use  of contact lenses, and other factors. Follow your health care provider's recommendations for frequency of eye exams.  . Eat a healthy diet. Foods like vegetables, fruits, whole grains, low-fat dairy products, and lean protein foods contain the nutrients you need without too many calories. Decrease your intake of foods high in solid fats, added sugars, and salt. Eat the right amount of calories for you. Get information about a proper diet from your health care provider, if necessary.  . Regular physical exercise is one  of the most important things you can do for your health. Most adults should get at least 150 minutes of moderate-intensity exercise (any activity that increases your heart rate and causes you to sweat) each week. In addition, most adults need muscle-strengthening exercises on 2 or more days a week.  Silver Sneakers may be a benefit available to you. To determine eligibility, you may visit the website: www.silversneakers.com or contact program at 623-302-9681 Mon-Fri between 8AM-8PM.   . Maintain a healthy weight. The body mass index (BMI) is a screening tool to identify possible weight problems. It provides an estimate of body fat based on height and weight. Your health care provider can find your BMI and can help you achieve or maintain a healthy weight.   For adults 20 years and older: ? A BMI below 18.5 is considered underweight. ? A BMI of 18.5 to 24.9 is normal. ? A BMI of 25 to 29.9 is considered overweight. ? A BMI of 30 and above is considered obese.   . Maintain normal blood lipids and cholesterol levels by exercising and minimizing your intake of saturated fat. Eat a balanced diet with plenty of fruit and vegetables. Blood tests for lipids and cholesterol should begin at age 98 and be repeated every 5 years. If your lipid or cholesterol levels are high, you are over 50, or you are at high risk for heart disease, you may need your  cholesterol levels checked more frequently. Ongoing high lipid and cholesterol levels should be treated with medicines if diet and exercise are not working.  . If you smoke, find out from your health care provider how to quit. If you do not use tobacco, please do not start.  . If you choose to drink alcohol, please do not consume more than 2 drinks per day. One drink is considered to be 12 ounces (355 mL) of beer, 5 ounces (148 mL) of wine, or 1.5 ounces (44 mL) of liquor.  . If you are 34-16 years old, ask your health care provider if you should take aspirin  to prevent strokes.  . Use sunscreen. Apply sunscreen liberally and repeatedly throughout the day. You should seek shade when your shadow is shorter than you. Protect yourself by wearing long sleeves, pants, a wide-brimmed hat, and sunglasses year round, whenever you are outdoors.  . Once a month, do a whole body skin exam, using a mirror to look at the skin on your back. Tell your health care provider of new moles, moles that have irregular borders, moles that are larger than a pencil eraser, or moles that have changed in shape or color.

## 2018-03-26 NOTE — Progress Notes (Signed)
PCP notes:   Health maintenance:  Flu vaccine - pt declined  Abnormal screenings:   Hearing - failed  Hearing Screening   125Hz  250Hz  500Hz  1000Hz  2000Hz  3000Hz  4000Hz  6000Hz  8000Hz   Right ear:   40 40 40  0    Left ear:   40 40 40  0     Patient concerns:   Patient has concerns with generalized pain. Pain scale: 10/10.  Patient has concerns with sleep management. Average 2 hours of interrupted sleep per night. Denies concerns with frequent urination at night.   Nurse concerns:  None  Next PCP appt:   04/30/18 @ 1200  I reviewed health advisor's note, was available for consultation on the day of service listed in this note, and agree with documentation and plan. [name], MD.

## 2018-03-26 NOTE — Progress Notes (Signed)
Subjective:   [name] [name] is a 84 y.o. male who presents for Medicare Annual/Subsequent preventive examination.  Review of Systems:  N/A Cardiac Risk Factors include: advanced age (>29mn, >>62women);male gender;obesity (BMI >30kg/m2);hypertension     Objective:    Vitals: BP 140/70 (BP Location: Right Arm, Patient Position: Sitting, Cuff Size: Large)   Pulse 82   Temp 98.7 F (37.1 C) (Oral)   Ht 5' 10"  (1.778 m) Comment: shoes  Wt 283 lb 12 oz (128.7 kg)   SpO2 91%   BMI 40.71 kg/m   Body mass index is 40.71 kg/m.  Advanced Directives 03/26/2018 12/03/2016 12/02/2016 11/14/2016 11/29/2015 09/14/2015 06/26/2015  Does Patient Have a Medical Advance Directive? No No No No No No No  Would patient like information on creating a medical advance directive? No - Patient declined No - Patient declined - - No - patient declined information Yes -Higher education careers advisergiven No - patient declined information    Tobacco Social History   Tobacco Use  Smoking Status Former Smoker  . Packs/day: 1.50  . Years: 53.00  . Pack years: 79.50  . Types: Cigarettes  . Last attempt to quit: 06/23/1999  . Years since quitting: 18.7  Smokeless Tobacco Former USystems developer . Types: Snuff, Chew     Counseling given: No   Clinical Intake:  Pre-visit preparation completed: Yes  Pain Score: 10-Worst pain ever(generalized pain)     Nutritional Status: BMI > 30  Obese Nutritional Risks: None Diabetes: No  How often do you need to have someone help you when you read instructions, pamphlets, or other written materials from your doctor or pharmacy?: 1 - Never What is the last grade level you completed in school?: 9th grade  Interpreter Needed?: No  Comments: pt lives with spouse Information entered by :: LPinson, LPN  Past Medical History:  Diagnosis Date  . AAA (abdominal aortic aneurysm) (HCC)    3.8 cm CTA 05/2015  . Anxiety   . COPD (chronic obstructive pulmonary disease) (HCC)    on home O2  4LPM  . Diverticulosis   . Former smoker    quit 2001  . Hemorrhoids   . Kidney stones    passed spontaneously  . OSA on CPAP    last study- in the home possibility- 2015, pt. unsure   . Prediabetes    Hgb A1C 5.8 in Jan 2017  . PUD (peptic ulcer disease)   . Right knee DJD 10/31/2011   Severe  . S/P TAVR (transcatheter aortic valve replacement) 06/26/2015   26 mm Edwards Sapien 3 transcatheter heart valve placed via percutaneous right transfemoral approach  . Severe aortic stenosis    a. Echo 11/16:  Mod LVH, EF 55-60%, no RWMA, Gr 1 DD, severe AS (mean 51 mmHg; peak 86 mmHg), MAC, mild LAE  b. s/p TAVR on 06/26/2015 with 280mEdward Sapien 3 THV  . Shortness of breath dyspnea    Past Surgical History:  Procedure Laterality Date  . CARDIAC CATHETERIZATION N/A 05/15/2015   Procedure: Right/Left Heart Cath and Coronary Angiography;  Surgeon: MiSherren MochaMD;  [facility];  Service: Cardiovascular;  Laterality: N/A;  . CHOLECYSTECTOMY    . EYE SURGERY     bilateral cataracts removed, /w IOL  . KNEE SURGERY Right 1985   arthrosopic -   . TEE WITHOUT CARDIOVERSION N/A 06/26/2015   Procedure: TRANSESOPHAGEAL ECHOCARDIOGRAM (TEE);  Surgeon: MiSherren MochaMD;  [facility] Service: Open Heart  Surgery;  Laterality: N/A;  . TONSILLECTOMY    . TRANSCATHETER AORTIC VALVE REPLACEMENT, TRANSFEMORAL N/A 06/26/2015   Procedure: TRANSCATHETER AORTIC VALVE REPLACEMENT, TRANSFEMORAL;  Surgeon: [name], MD;  Location: [facility];  Service: Open Heart Surgery;  Laterality: N/A;   Family History  Problem Relation Age of Onset  . Stroke Mother   . Heart attack Mother 52       deceased  . Prostate cancer Father 70  . Colon cancer Neg Hx    Social History   Socioeconomic History  . Marital status: Married    Spouse name: Not on file  . Number of children: 1  . Years of education: Not on file  . Highest education level: Not on file  Occupational History  .  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 Financial resource strain: Not on file  . Food insecurity:    Worry: Not on file    Inability: Not on file  . Transportation needs:    Medical: Not on file    Non-medical: Not on file  Tobacco Use  . Smoking status: Former Smoker    Packs/day: 1.50    Years: 53.00    Pack years: 79.50    Types: Cigarettes    Last attempt to quit: 06/23/1999    Years since quitting: 18.7  . Smokeless tobacco: Former Systems developer    Types: Snuff, Chew  Substance and Sexual Activity  . Alcohol use: No  . Drug use: No  . Sexual activity: Not Currently  Lifestyle  . Physical activity:    Days per week: Not on file    Minutes per session: Not on file  . Stress: Not on file  Relationships  . Social connections:    Talks on phone: Not on file    Gets together: Not on file    Attends religious service: Not on file    Active member of club or organization: Not on file    Attends meetings of clubs or organizations: Not on file    Relationship status: Not on file  Other Topics Concern  . 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      Outpatient Encounter Medications as of 03/26/2018  Medication Sig  . albuterol (PROAIR HFA) 108 (90 Base) MCG/ACT inhaler INHALE 1-2 PUFFS INTO THE LUNGS EVERY 6 (SIX) HOURS AS NEEDED FOR WHEEZING.  Marland Kitchen aspirin EC 81 MG tablet Take 1 tablet (81 mg total) by mouth daily.  . fluocinonide cream (LIDEX) 4.91 % Apply 1 application topically 2 (two) times daily as needed. Use sparingly.  . Fluticasone-Umeclidin-Vilant (TRELEGY ELLIPTA) 100-62.5-25 MCG/INH AEPB Inhale 1 puff into the lungs daily.  . furosemide (LASIX) 20 MG tablet Take 1 tablet (20 mg total) by mouth daily.  Marland Kitchen guaiFENesin (MUCINEX) 600 MG 12 hr tablet Take 600 mg by mouth 2 (two) times daily.   Marland Kitchen ibuprofen  (ADVIL,MOTRIN) 200 MG tablet Take 200 mg by mouth every 6 (six) hours as needed (pain). Reported on 07/30/2015  . LORazepam (ATIVAN) 1 MG tablet TAKE 1/4 TO 1/2 TABLET BY MOUTH EVERY 6 HOURS AS NEEDED  . OXYGEN Inhale into the lungs. 4 Liters  . polyethylene glycol (MIRALAX / GLYCOLAX) packet Take 17 g by mouth daily as needed (constiptation).  Marland Kitchen Respiratory Therapy  Supplies (FLUTTER) DEVI Use as directed  . sodium chloride (OCEAN) 0.65 % SOLN nasal spray Place 2 sprays into both nostrils daily as needed for congestion.   . budesonide-formoterol (SYMBICORT) 160-4.5 MCG/ACT inhaler Inhale 2 puffs into the lungs 2 (two) times daily. (Patient not taking: Reported on 03/26/2018)   No facility-administered encounter medications on file as of 03/26/2018.     Activities of Daily Living In your present state of health, do you have any difficulty performing the following activities: 03/26/2018  Hearing? N  Vision? N  Difficulty concentrating or making decisions? Y  Comment concerns with short-term memory  Walking or climbing stairs? Y  Dressing or bathing? N  Doing errands, shopping? Y  Preparing Food and eating ? N  Using the Toilet? N  In the past six months, have you accidently leaked urine? N  Do you have problems with loss of bowel control? N  Managing your Medications? N  Managing your Finances? N  Housekeeping or managing your Housekeeping? N  Some recent data might be hidden    Patient Care Team: Tonia Ghent, MD as PCP - General (Family Medicine) [name], MD as PCP - Cardiology (Cardiology) Noralee Space, MD as Consulting Physician (Pulmonary Disease) Nahser, Wonda Cheng, MD as Consulting Physician (Cardiology)   Assessment:   This is a routine wellness examination for Solly.    Hearing Screening   125Hz  250Hz  500Hz  1000Hz  2000Hz  3000Hz  4000Hz  6000Hz  8000Hz   Right ear:   40 40 40  0    Left ear:   40 40 40  0      Visual Acuity Screening   Right eye Left eye Both  eyes  Without correction:     With correction: 20/25 20/25 20/25-1    Exercise Activities and Dietary recommendations Current Exercise Habits: Home exercise routine, Type of exercise: stretching, Time (Minutes): 30, Frequency (Times/Week): 4, Weekly Exercise (Minutes/Week): 120, Intensity: Mild, Exercise limited by: orthopedic condition(s)  Goals    . Increase physical activity     Starting 03/26/2018, I will continue to do arm and leg exercises for 30 min 4-5 days per week.        Fall Risk Fall Risk  03/26/2018 11/14/2016 12/27/2015 09/14/2015 09/14/2015  Falls in the past year? No Yes No Yes Yes  Comment - pt stated he tripped. no injury or medical treatment - - fell in living while walking in tight space  Number falls in past yr: - 1 - 1 1  Injury with Fall? - No - No No  Follow up - - - Falls evaluation completed;Education provided;Falls prevention discussed Falls evaluation completed;Education provided;Falls prevention discussed   Depression Screen PHQ 2/9 Scores 03/26/2018 11/14/2016 12/27/2015 09/14/2015  PHQ - 2 Score 0 2 0 0  PHQ- 9 Score 0 7 - -    Cognitive Function MMSE - Mini Mental State Exam 03/26/2018 11/14/2016 09/14/2015  Orientation to time 5 5 5   Orientation to Place 5 5 5   Registration 3 3 3   Attention/ Calculation 0 0 0  Recall 3 2 3   Recall-comments - pt was unable to recall 1 of 3 words -  Language- name 2 objects 0 0 0  Language- repeat 1 1 1   Language- follow 3 step command 3 3 3   Language- read & follow direction 0 0 0  Write a sentence 0 0 0  Copy design 0 0 0  Total score 20 19 20      PLEASE NOTE: A Mini-Cog screen  was completed. Maximum score is 20. A value of 0 denotes this part of Folstein MMSE was not completed or the patient failed this part of the Mini-Cog screening.   Mini-Cog Screening Orientation to Time - Max 5 pts Orientation to Place - Max 5 pts Registration - Max 3 pts Recall - Max 3 pts Language Repeat - Max 1 pts Language Follow 3  Step Command - Max 3 pts     Immunization History  Administered Date(s) Administered  . Influenza Split 06/17/2011, 02/23/2012  . Influenza Whole 04/27/2006, 04/10/2009, 04/10/2010  . Influenza, Seasonal, Injecte, Preservative Fre 06/13/2016  . Influenza,inj,Quad PF,6+ Mos 03/17/2013, 04/24/2014, 03/17/2015, 07/15/2016, 03/26/2018  . Pneumococcal Polysaccharide-23 02/15/1999, 10/20/2011  . Td 02/05/2006, 01/08/2017  . Zoster 01/15/2009    Screening Tests Health Maintenance  Topic Date Due  . COLONOSCOPY  11/14/2025 (Originally 03/22/2012)  . TETANUS/TDAP  01/09/2027  . INFLUENZA VACCINE  Completed  . PNA vac Low Risk Adult  Completed      Plan:     I have personally reviewed, addressed, and noted the following in the patient's chart:  A. Medical and social history B. Use of alcohol, tobacco or illicit drugs  C. Current medications and supplements D. Functional ability and status E.  Nutritional status F.  Physical activity G. Advance directives H. List of other physicians I.  Hospitalizations, surgeries, and ER visits in previous 12 months J.  [place] to include hearing, vision, cognitive, depression L. Referrals and appointments - none  In addition, I have reviewed and discussed with patient certain preventive protocols, quality metrics, and best practice recommendations. A written personalized care plan for preventive services as well as general preventive health recommendations were provided to patient.  See attached scanned questionnaire for additional information.   Signed,   Lindell Noe, MHA, BS, LPN Health Coach

## 2018-03-30 ENCOUNTER — Encounter: Payer: Self-pay | Admitting: Family Medicine

## 2018-03-30 ENCOUNTER — Ambulatory Visit (INDEPENDENT_AMBULATORY_CARE_PROVIDER_SITE_OTHER): Payer: PPO | Admitting: Family Medicine

## 2018-03-30 VITALS — BP 140/70 | HR 82 | Temp 98.7°F | Ht 70.0 in | Wt 283.8 lb

## 2018-03-30 DIAGNOSIS — E559 Vitamin D deficiency, unspecified: Secondary | ICD-10-CM | POA: Diagnosis not present

## 2018-03-30 DIAGNOSIS — F411 Generalized anxiety disorder: Secondary | ICD-10-CM

## 2018-03-30 DIAGNOSIS — G4700 Insomnia, unspecified: Secondary | ICD-10-CM | POA: Diagnosis not present

## 2018-03-30 DIAGNOSIS — Z952 Presence of prosthetic heart valve: Secondary | ICD-10-CM | POA: Diagnosis not present

## 2018-03-30 DIAGNOSIS — J449 Chronic obstructive pulmonary disease, unspecified: Secondary | ICD-10-CM | POA: Diagnosis not present

## 2018-03-30 DIAGNOSIS — Z7189 Other specified counseling: Secondary | ICD-10-CM

## 2018-03-30 DIAGNOSIS — J9610 Chronic respiratory failure, unspecified whether with hypoxia or hypercapnia: Secondary | ICD-10-CM

## 2018-03-30 DIAGNOSIS — R238 Other skin changes: Secondary | ICD-10-CM | POA: Diagnosis not present

## 2018-03-30 MED ORDER — CEPHALEXIN 500 MG PO CAPS: 500.0000 mg | ORAL_CAPSULE | Freq: Three times a day (TID) | ORAL | 0 refills | Status: DC

## 2018-03-30 MED ORDER — LORAZEPAM 1 MG PO TABS: ORAL_TABLET | ORAL | 1 refills | Status: DC

## 2018-03-30 MED ORDER — VITAMIN D (ERGOCALCIFEROL) 1.25 MG (50000 UNIT) PO CAPS: 50000.0000 [IU] | ORAL_CAPSULE | ORAL | 0 refills | Status: DC

## 2018-03-30 NOTE — Patient Instructions (Signed)
Vitamin D weekly, recheck lab in about 3 months.  Start keflex and update me about your right elbow.  Let me check with pulmonary in the meantime.  Take care.  Glad to see you.

## 2018-03-30 NOTE — Progress Notes (Signed)
Declined hearing aids.    Sleep disrupted.  occ nocturia but that isn't the main issue.  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  He was asking about potentially starting gabapentin.  He has pulmonary follow-up pending and I would like to review those notes first.  Discussed.  He agrees.  S/p TAVR per cards.  No CP.  He has some shortness of breath as described below.  Still on O2 by nasal cannula at baseline.  Pulmonary f/u pending.  On trelegy, seems to maybe do better with that medicine.  He has low sats when off O2.  Not SOB at rest, on O2 but SOB if off O2.  Still using CPAP at night.  Some sputum, but that is rare.  No FCNAVD.  Using bzd prn w/o ade.    Vit D def.  D/w pt about replacement.    R elbow irritation lateral to the olecranon with possible cyst in the skin that can be tender.  Hasn't drained.  Minimally warm.  Present for months.    PMH and SH reviewed  ROS: Per HPI unless specifically indicated in ROS section   Meds, vitals, and allergies reviewed.   GEN: nad, alert and oriented, on O2 via [place] HEENT: mucous membranes moist NECK: supple w/o LA CV: rrr.  PULM:  Global decrease in breath sounds but otherwise ctab, no inc wob ABD: soft, +bs EXT: no edema SKIN: no acute rash other than changes noted at the R elbow- 5x10cm patch of mild skin irritation/mild erythema with small cyst noted.

## 2018-04-01 DIAGNOSIS — G4700 Insomnia, unspecified: Secondary | ICD-10-CM | POA: Insufficient documentation

## 2018-04-01 NOTE — Assessment & Plan Note (Signed)
He could have a very mild cellulitis.  It does not appear to need incision and drainage.  Start Keflex.  Update me as needed.  He can monitor the area.  Okay for outpatient follow-up.  Routine cautions given.

## 2018-04-01 NOTE — Assessment & Plan Note (Signed)
Baseline O2 requirement continues.  See above.

## 2018-04-01 NOTE — Assessment & Plan Note (Signed)
No chest pain.  I think his shortness of breath is more likely to be related to his pulmonary situation.  Discussed.

## 2018-04-01 NOTE — Assessment & Plan Note (Signed)
Continue O2 at baseline medications for now.  He is going to follow-up with pulmonary.  I want to see those notes before we change any of his medications otherwise.  He does have anxiety that is likely related to COPD and he has been using a benzodiazepine as needed without adverse effect.  Reasonable to continue for now.

## 2018-04-01 NOTE — Assessment & Plan Note (Signed)
Would continue as needed benzodiazepine.  No adverse effect on medication.

## 2018-04-01 NOTE — Assessment & Plan Note (Signed)
Discussed with patient about vitamin D replacement, recheck level in about 3 months.  See orders.

## 2018-04-01 NOTE — Assessment & Plan Note (Signed)
Wife designated if patient were incapacitated.  Discussed with patient at office visit.

## 2018-04-01 NOTE — Assessment & Plan Note (Signed)
Likely multifactorial.  Discussed with patient. He is also been told in the past that he had restless leg syndrome.  He was asking about potentially starting gabapentin.  He has pulmonary follow-up pending and I would like to review those notes first.  Discussed.  He agrees. >25 minutes spent in face to face time with patient, >50% spent in counselling or coordination of care.

## 2018-04-02 ENCOUNTER — Ambulatory Visit: Payer: PPO | Admitting: Nurse Practitioner

## 2018-04-16 ENCOUNTER — Other Ambulatory Visit: Payer: Self-pay | Admitting: Acute Care

## 2018-05-06 ENCOUNTER — Encounter: Payer: PPO | Admitting: Family Medicine

## 2018-05-19 ENCOUNTER — Ambulatory Visit (INDEPENDENT_AMBULATORY_CARE_PROVIDER_SITE_OTHER): Payer: PPO | Admitting: Family Medicine

## 2018-05-19 ENCOUNTER — Encounter: Payer: Self-pay | Admitting: Family Medicine

## 2018-05-19 DIAGNOSIS — R609 Edema, unspecified: Secondary | ICD-10-CM

## 2018-05-19 NOTE — Progress Notes (Signed)
L angle of the jaw swelling.  Noted about 2 days ago.  Puffy, not severe pain but feels some pressure.  Symptoms seem to come on pretty quickly.  No trauma.  No FCNAVD.  No skin redness.  No drainage.  No R sided sx.  No change in size in the last day.  No neck symptoms.  No stridor.  No trouble swallowing.  H/o R elbow cellulitis.  Tender locally.  Not draining.  Not worse in the last week, but still with some residual puffiness.  He wanted that checked.  Meds, vitals, and allergies reviewed.   ROS: Per HPI unless specifically indicated in ROS section   nad On oxygen at baseline. ncat except for localized swelling near the left parotid. Right parotid is normal. No skin changes otherwise on the face. No swelling at the submandibular or sublingual salivary glands. Tympanic membranes within normal limits. No TMJ pain. Neck supple, no lymphadenopathy.  No stridor. Right elbow with 3 x 1.5 cm area of scar tissue that does not appear infected.  Normal range of motion otherwise.

## 2018-05-19 NOTE — Patient Instructions (Signed)
Try eating lemons and see if the swelling gets better.  If not, then we can set you up with ENT.  If any fevers or progressive pain, then let us know.  I wouldn't do anything about your elbow since that looks like residual scar tissue.  Take care.  Glad to see you.

## 2018-05-20 DIAGNOSIS — R609 Edema, unspecified: Secondary | ICD-10-CM | POA: Insufficient documentation

## 2018-05-20 NOTE — Assessment & Plan Note (Signed)
No erythema.  Does not appear infected.  Likely salivary duct stone.  Discussed with patient about pathophysiology.  We can refer to ENT if needed.  In the meantime he can eat lemons or sour candy and that may resolve the issue.  Routine cautions given.  He understood.  He has some scar tissue on the elbow but this does not appear to need any treatment at this point.  Discussed.

## 2018-06-20 ENCOUNTER — Other Ambulatory Visit: Payer: Self-pay | Admitting: Family Medicine

## 2018-06-20 DIAGNOSIS — E559 Vitamin D deficiency, unspecified: Secondary | ICD-10-CM

## 2018-06-21 NOTE — Telephone Encounter (Signed)
Electronic refill request. Vitamin D 50,000 units Last office visit:   05/19/18 Last Filled:     12 capsule 0 03/30/2018  Please advise.

## 2018-06-22 NOTE — Telephone Encounter (Signed)
Needs follow-up vitamin D level first.  Order is in EMR.  Prescription denied.  Thanks.

## 2018-06-22 NOTE — Telephone Encounter (Signed)
Left detailed message on voicemail.  

## 2018-06-29 ENCOUNTER — Other Ambulatory Visit: Payer: Self-pay | Admitting: *Deleted

## 2018-06-29 DIAGNOSIS — E559 Vitamin D deficiency, unspecified: Secondary | ICD-10-CM

## 2018-06-29 NOTE — Telephone Encounter (Signed)
Faxed refill request. Vitamin D2  50,000 units Last office visit:   05/19/18 Last Filled:    12 capsule 0 03/30/2018  Please advise.

## 2018-06-30 NOTE — Telephone Encounter (Signed)
Did not prescription.  Needs recheck vitamin D level first.  Order is in EMR.  Needs a lab visit.  Thanks.

## 2018-07-01 ENCOUNTER — Encounter: Payer: Self-pay | Admitting: *Deleted

## 2018-07-01 NOTE — Telephone Encounter (Signed)
Pt returned call to [place]. Scheduled pt for lab only visit on 1/20 @ 10am.

## 2018-07-01 NOTE — Telephone Encounter (Signed)
Left detailed message on voicemail to schedule nonfasting lab appointment.

## 2018-07-05 ENCOUNTER — Other Ambulatory Visit: Payer: PPO

## 2018-07-12 ENCOUNTER — Other Ambulatory Visit (INDEPENDENT_AMBULATORY_CARE_PROVIDER_SITE_OTHER): Payer: PPO

## 2018-07-12 DIAGNOSIS — E559 Vitamin D deficiency, unspecified: Secondary | ICD-10-CM

## 2018-07-12 LAB — VITAMIN D 25 HYDROXY (VIT D DEFICIENCY, FRACTURES): VITD: 24.53 ng/mL — AB (ref 30.00–100.00)

## 2018-07-15 ENCOUNTER — Other Ambulatory Visit: Payer: Self-pay | Admitting: Family Medicine

## 2018-07-15 DIAGNOSIS — E559 Vitamin D deficiency, unspecified: Secondary | ICD-10-CM

## 2018-07-15 MED ORDER — VITAMIN D (ERGOCALCIFEROL) 1.25 MG (50000 UNIT) PO CAPS: 50000.0000 [IU] | ORAL_CAPSULE | ORAL | 0 refills | Status: DC

## 2018-10-05 ENCOUNTER — Telehealth: Payer: Self-pay

## 2018-10-05 NOTE — Telephone Encounter (Signed)
Copied from [place] [phone]. Topic: General - Other >> Oct 05, 2018  2:48 PM Celene Kras A wrote: Reason for CRM: Lovey Newcomer, from [facility], called stating due to pts insurance he gets the benefit of a nurse practitioner who visits him at home and will be faxing in notes to keep PCP up to date on pt after every visit. She states a good number to reach them is [phone] for more information.

## 2018-10-06 NOTE — Telephone Encounter (Signed)
Noted. Thanks.

## 2018-11-28 ENCOUNTER — Telehealth: Payer: Self-pay | Admitting: Family Medicine

## 2018-11-28 DIAGNOSIS — I77819 Aortic ectasia, unspecified site: Secondary | ICD-10-CM

## 2018-11-28 NOTE — Telephone Encounter (Signed)
Due for f/u recheck aorta via u/s.  Ordered. Thanks.

## 2018-11-29 NOTE — Telephone Encounter (Signed)
Dr Damita Dunnings, You ordered an Imaging study not a Vascular order. Did you want to schedule a AAA ? Tried to schedule with  Cardiology and they thought you might want the AAA not a Aorta US , please clarify as Cardiology doesn't do what you ordered.

## 2018-11-30 NOTE — Telephone Encounter (Signed)
AAA scheduled for 8/238/20 at 9:15am.

## 2018-11-30 NOTE — Addendum Note (Signed)
Addended by: Tonia Ghent on: 11/30/2018 06:41 AM   Modules accepted: Orders

## 2018-11-30 NOTE — Telephone Encounter (Signed)
Order changed. Thanks!

## 2018-12-20 DIAGNOSIS — R300 Dysuria: Secondary | ICD-10-CM | POA: Diagnosis not present

## 2019-01-03 ENCOUNTER — Other Ambulatory Visit: Payer: Self-pay | Admitting: Cardiovascular Disease

## 2019-01-03 MED ORDER — FUROSEMIDE 20 MG PO TABS: 20.0000 mg | ORAL_TABLET | Freq: Every day | ORAL | 0 refills | Status: DC

## 2019-01-08 ENCOUNTER — Other Ambulatory Visit: Payer: Self-pay | Admitting: Family Medicine

## 2019-01-10 NOTE — Telephone Encounter (Signed)
Electronic refill request. Lorazepam Last office visit:   05/19/2018 Acute Last Filled:    30 tablet 1 03/30/2018  Please advise.

## 2019-01-11 NOTE — Telephone Encounter (Signed)
Sent. Thanks.   

## 2019-01-13 DIAGNOSIS — G4733 Obstructive sleep apnea (adult) (pediatric): Secondary | ICD-10-CM | POA: Diagnosis not present

## 2019-01-30 ENCOUNTER — Other Ambulatory Visit: Payer: Self-pay | Admitting: Cardiovascular Disease

## 2019-02-01 NOTE — Telephone Encounter (Signed)
Pt overdue for 12 month f/u. °Please contact pt for future appointment. °

## 2019-02-11 ENCOUNTER — Other Ambulatory Visit: Payer: PPO

## 2019-02-19 ENCOUNTER — Other Ambulatory Visit: Payer: Self-pay | Admitting: Cardiovascular Disease

## 2019-02-22 IMAGING — DX DG CHEST 2V
2 series · 2 of 2 positions shown · non-contrast
Comparison: CT Abdomen and Pelvis and chest radiographs 12/02/2016.
chest CT 04/18/2015.

CLINICAL DATA: 84-year-old male with increasing chronic shortness
of breath. Former smoker.

EXAM:
CHEST - 2 VIEW

[chest pa]
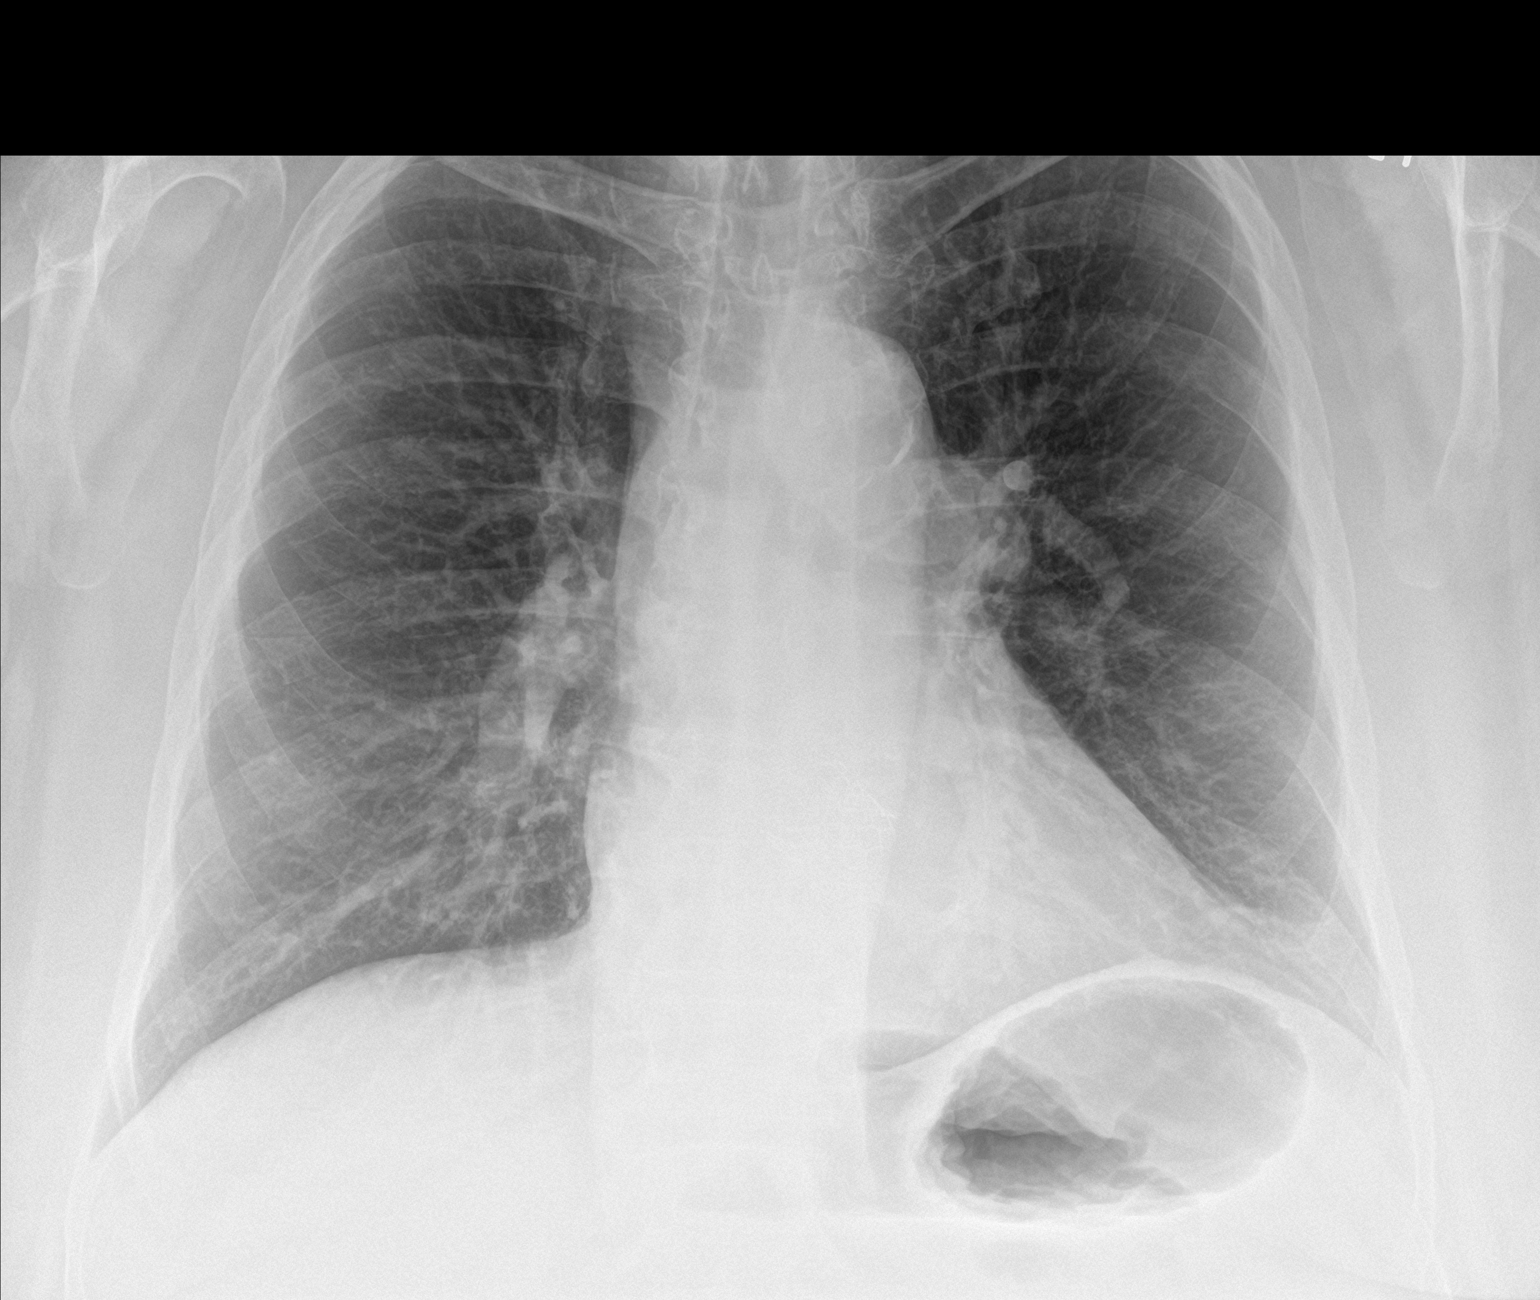

[chest lat]
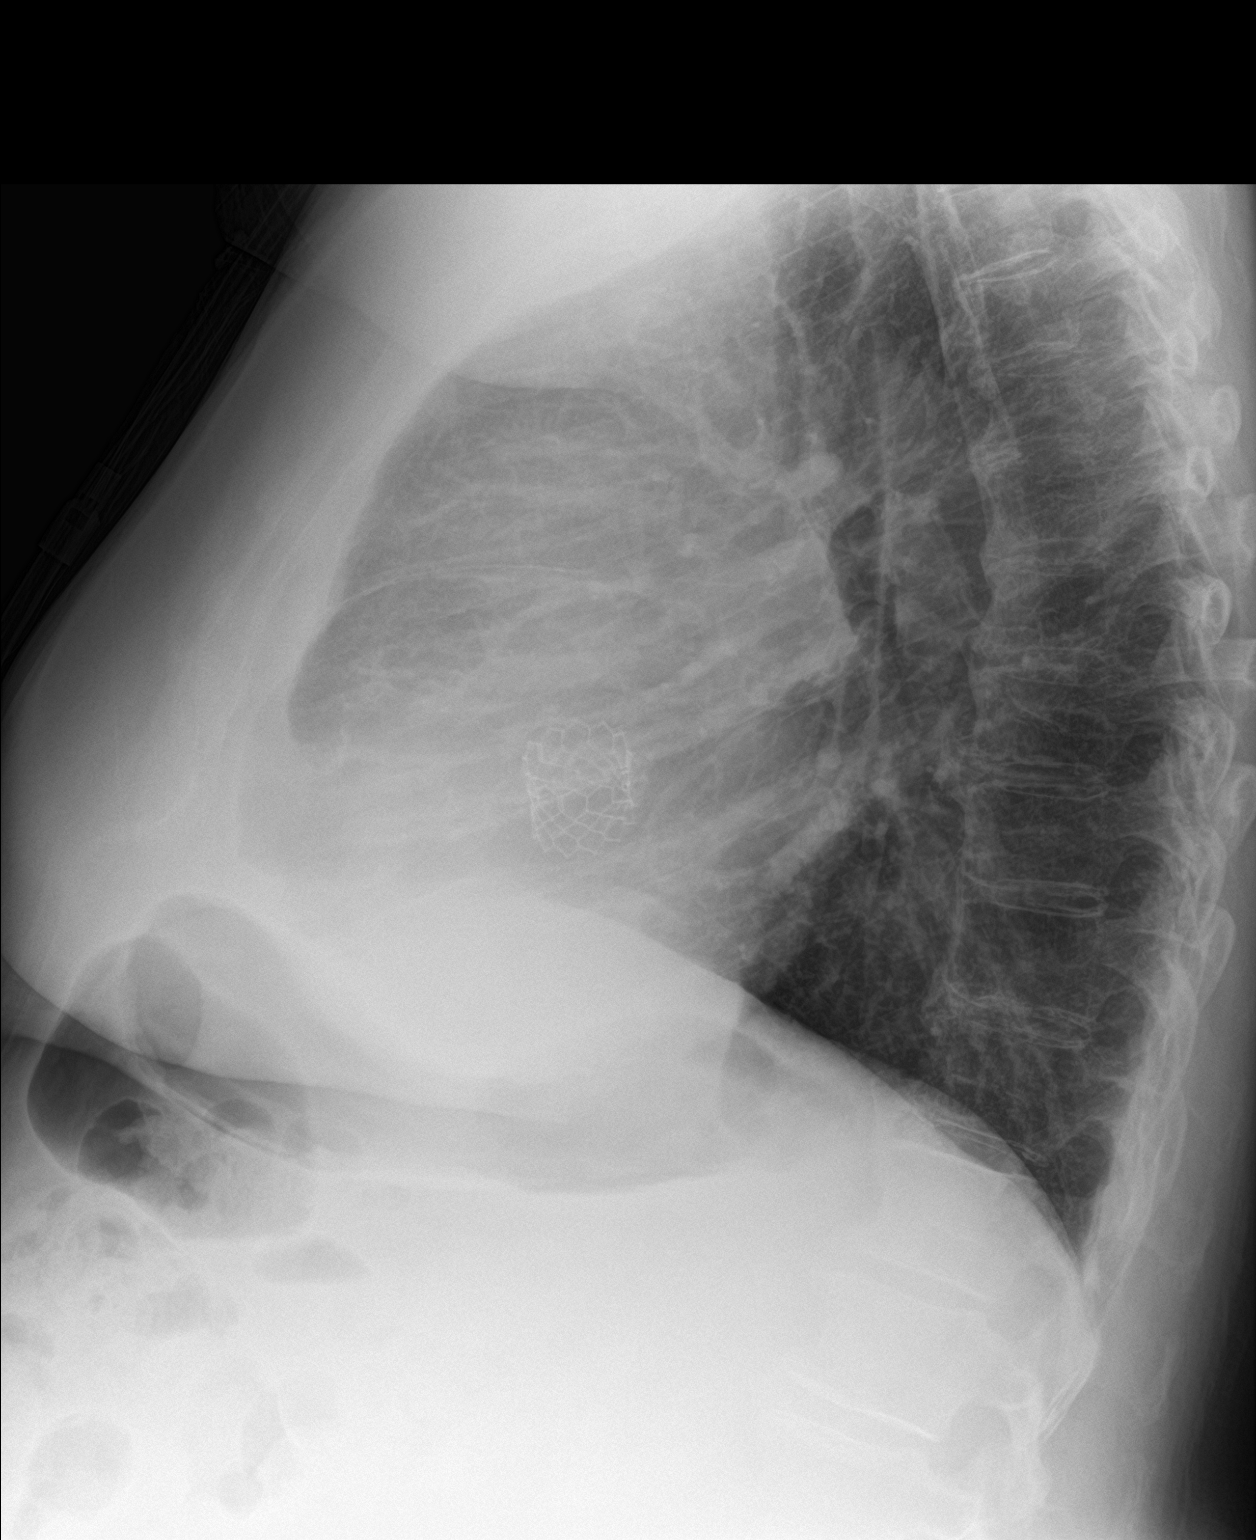

[2 of 2 positions shown; findings below may reference images not displayed]

FINDINGS: Chronic prosthetic cardiac valve. Stable mild cardiomegaly. Other
mediastinal contours are within normal limits. Visualized tracheal
air column is within normal limits. Stable lung volumes. No
pneumothorax, pulmonary edema, pleural effusion or confluent
pulmonary opacity. Osteopenia. No acute osseous abnormality
identified. Negative visible bowel gas pattern.
IMPRESSION: No acute cardiopulmonary abnormality.

## 2019-03-23 ENCOUNTER — Other Ambulatory Visit: Payer: Self-pay | Admitting: Cardiovascular Disease

## 2019-03-28 ENCOUNTER — Telehealth: Payer: Self-pay | Admitting: Family Medicine

## 2019-03-28 NOTE — Telephone Encounter (Signed)
Called pt and he is scheduled on 09/20/2019 for AWV + Labs and appt with Dr. Damita Dunnings on 09/27/18.  Tonia Ghent, MD  [place], [place] annual exam back to spring 2021 if he prefers but please document in chart since we are offering virtual visits.    Thanks.   Brigitte Pulse   Previous Messages  ----- Message -----  From: Tonia Ghent, MD  Sent: 03/25/2019  4:35 PM EDT  To: Tonia Ghent, MD  Subject: how far to push out annual exam? talk to Po*

## 2019-04-01 ENCOUNTER — Ambulatory Visit: Payer: PPO

## 2019-04-08 ENCOUNTER — Encounter: Payer: PPO | Admitting: Family Medicine

## 2019-04-14 ENCOUNTER — Other Ambulatory Visit: Payer: Self-pay | Admitting: Cardiovascular Disease

## 2019-04-14 MED ORDER — FUROSEMIDE 20 MG PO TABS: 20.0000 mg | ORAL_TABLET | Freq: Every day | ORAL | 0 refills | Status: DC

## 2019-04-14 NOTE — Telephone Encounter (Signed)
°*  STAT* If patient is at the pharmacy, call can be transferred to refill team.   1. Which medications need to be refilled? (please list name of each medication and dose if known)   furosemide (LASIX) 20 MG tablet  2. Which pharmacy/location (including street and city if local pharmacy) is medication to be sent to?  [id number] - [address], [place]  3. Do they need a 30 day or 90 day supply? 90   Patient is out of medication.   Patient is scheduled for a telemedicine (tepelphone call) with Richardson Dopp, Dr. Antionette Char PA on 04-22-19 at 9:45 am

## 2019-04-14 NOTE — Telephone Encounter (Signed)
Pt's medication was sent to pt's pharmacy as requested. Confirmation received.  °

## 2019-04-15 DIAGNOSIS — G4733 Obstructive sleep apnea (adult) (pediatric): Secondary | ICD-10-CM | POA: Diagnosis not present

## 2019-04-19 ENCOUNTER — Encounter: Payer: Self-pay | Admitting: Physician Assistant

## 2019-04-19 NOTE — Progress Notes (Signed)
Virtual Visit via Telephone Note   This visit type was conducted due to national recommendations for restrictions regarding the COVID-19 Pandemic (e.g. social distancing) in an effort to limit this patient's exposure and mitigate transmission in our community.  Due to his co-morbid illnesses, this patient is at least at moderate risk for complications without adequate follow up.  This format is felt to be most appropriate for this patient at this time.  The patient did not have access to video technology/had technical difficulties with video requiring transitioning to audio format only (telephone).  All issues noted in this document were discussed and addressed.  No physical exam could be performed with this format.  Please refer to the patient's chart for his  consent to telehealth for [facility].   Date:  04/20/2019   ID:  [name] [name], DOB 1933/09/20, MRN [id number]  Patient Location: Home Provider Location: Home  PCP:  Tonia Ghent, MD  Cardiologist:  [name], MD  Evaluation Performed:  Follow-Up Visit  Chief Complaint:  15 months follow up  History of Present Illness:    [name] [name] is a 85 y.o. male with hx of aortic valve disease, chronic diastolic heart failure, chronic respiraotry failure on oxygen, COPD, AAA, OSA and morbid obesity seen for follow up.  The patient developed severe aortic stenosis and was treated with TAVR in January 2017 with a 26 mm Edwards Sapien 3 valve.  The patient has chronic respiratory failure secondary to a combination of COPD, morbid obesity, obstructive sleep apnea, and diastolic heart failure.  He is on 5 L of oxygen.   Last seen by Dr. Burt Knack 11/2017. Last echo 11/2017 showed LVEF of 65%, grade 1 DD, s/p TAVR Mean gradient (S): 18 mm Hg. Last study 11/2017 showed AAA of 3.9 cm.   Here today for follow up. Doing well. Staying at home. Stable dyspnea on 5L oxygen. Does household chores. No chest pain, palpitations, Le edema, melena,  dizziness or syncope.     The patient does not have symptoms concerning for COVID-19 infection (fever, chills, cough, or new shortness of breath).     Past Medical History:  Diagnosis Date   AAA (abdominal aortic aneurysm) ([place])    a. 3.8 cm CTA 05/2015 b. 3.9 cm by [place] in 11/2017   Anxiety    COPD (chronic obstructive pulmonary disease) ([place])    on home O2 4LPM   Diverticulosis    Former smoker    quit 2001   Hemorrhoids    Kidney stones    passed spontaneously   OSA on CPAP    last study- in the home possibility- 2015, pt. unsure    Prediabetes    Hgb A1C 5.8 in Jan 2017   PUD (peptic ulcer disease)    Right knee DJD 10/31/2011   Severe   S/P TAVR (transcatheter aortic valve replacement) 06/26/2015   26 mm Edwards Sapien 3 transcatheter heart valve placed via percutaneous right transfemoral approach   Severe aortic stenosis    a. Echo 11/16:  Mod LVH, EF 55-60%, no RWMA, Gr 1 DD, severe AS (mean 51 mmHg; peak 86 mmHg), MAC, mild LAE  b. s/p TAVR on 06/26/2015 with 34m Edward Sapien 3 THV   Shortness of breath dyspnea    Past Surgical History:  Procedure Laterality Date   CARDIAC CATHETERIZATION N/A 05/15/2015   Procedure: Right/Left Heart Cath and Coronary Angiography;  Surgeon: MSherren Mocha MD;  Location: [facility];  Service:  Cardiovascular;  Laterality: N/A;   CHOLECYSTECTOMY     EYE SURGERY     bilateral cataracts removed, /w IOL   KNEE SURGERY Right 1985   arthrosopic -    TEE WITHOUT CARDIOVERSION N/A 06/26/2015   Procedure: TRANSESOPHAGEAL ECHOCARDIOGRAM (TEE);  Surgeon: [name], MD;  Location: [place];  Service: Open Heart Surgery;  Laterality: N/A;   TONSILLECTOMY     TRANSCATHETER AORTIC VALVE REPLACEMENT, TRANSFEMORAL N/A 06/26/2015   Procedure: TRANSCATHETER AORTIC VALVE REPLACEMENT, TRANSFEMORAL;  Surgeon: [name], MD;  Location: [place];  Service: Open Heart Surgery;  Laterality: N/A;     Current Meds  Medication  Sig   albuterol (PROAIR HFA) 108 (90 Base) MCG/ACT inhaler INHALE 1-2 PUFFS INTO THE LUNGS EVERY 6 (SIX) HOURS AS NEEDED FOR WHEEZING.   aspirin EC 81 MG tablet Take 1 tablet (81 mg total) by mouth daily.   fluocinonide cream (LIDEX) 6.57 % Apply 1 application topically 2 (two) times daily as needed. Use sparingly.   furosemide (LASIX) 20 MG tablet Take 20 mg by mouth daily.   guaiFENesin (MUCINEX) 600 MG 12 hr tablet Take 600 mg by mouth 2 (two) times daily as needed for cough or to loosen phlegm.   ibuprofen (ADVIL,MOTRIN) 200 MG tablet Take 200 mg by mouth every 6 (six) hours as needed (pain). Reported on 07/30/2015   LORazepam (ATIVAN) 1 MG tablet TAKE 1/4 TO 1/2 TABLET BY MOUTH EVERY 6 HOURS AS NEEDED   OXYGEN Inhale into the lungs. 5 Liters   polyethylene glycol (MIRALAX / GLYCOLAX) packet Take 17 g by mouth daily as needed (constiptation).   Respiratory Therapy Supplies (FLUTTER) DEVI Use as directed   sodium chloride (OCEAN) 0.65 % SOLN nasal spray Place 2 sprays into both nostrils daily as needed for congestion.    SYMBICORT 160-4.5 MCG/ACT inhaler TAKE 2 PUFFS BY MOUTH TWICE A DAY     Allergies:   Iohexol, Atorvastatin, Celecoxib, Clinoril [sulindac], Nitrofurantoin, Spiriva handihaler [tiotropium bromide monohydrate], and Sulfa antibiotics   Social History   Tobacco Use   Smoking status: Former Smoker    Packs/day: 1.50    Years: 53.00    Pack years: 79.50    Types: Cigarettes    Quit date: 06/23/1999    Years since quitting: 19.8   Smokeless tobacco: Former Systems developer    Types: Snuff, Chew  Substance Use Topics   Alcohol use: No   Drug use: No     Family Hx: The patient's family history includes Heart attack (age of onset: 10) in his mother; Prostate cancer (age of onset: 64) in his father; Stroke in his mother. There is no history of Colon cancer.  ROS:   Please see the history of present illness.   All other systems reviewed and are negative.   Prior CV  studies:   The following studies were reviewed today:  As summarized above  Labs/Other Tests and Data Reviewed:    EKG:  No ECG reviewed.  Recent Labs: No results found for requested labs within last 8760 hours.   Recent Lipid Panel Lab Results  Component Value Date/Time   CHOL 171 03/26/2018 11:26 AM   TRIG 121.0 03/26/2018 11:26 AM   HDL 48.00 03/26/2018 11:26 AM   CHOLHDL 4 03/26/2018 11:26 AM   LDLCALC 99 03/26/2018 11:26 AM    Wt Readings from Last 3 Encounters:  04/20/19 272 lb (123.4 kg)  05/19/18 271 lb 4 oz (123 kg)  03/30/18 283 lb 12 oz (128.7 kg)  Objective:    Vital Signs:  Ht _0  (1.778 m)    Wt 272 lb (123.4 kg)    BMI 39.03 kg/m    VITAL SIGNS:  reviewed GEN:  no acute distress PSYCH:  normal affect  ASSESSMENT & PLAN:     1. s/p TAVR  - Mean gradient (S): 18 mm Hg by last echo 11/2017. Doing well.   2. Chronic diastolic CHF -uses daily lasix. No exacerbating symptoms. He will get BMET when sees PCP.  3. AAA - 3.9 cm by [place] in 11/2017  4. COPD on 5 L oxygen   COVID-19 Education: The signs and symptoms of COVID-19 were discussed with the patient and how to seek care for testing (follow up with PCP or arrange E-visit).  The importance of social distancing was discussed today.  Time:   Today, I have spent 7 minutes with the patient with telehealth technology discussing the above problems.     Medication Adjustments/Labs and Tests Ordered: Current medicines are reviewed at length with the patient today.  Concerns regarding medicines are outlined above.   Tests Ordered: No orders of the defined types were placed in this encounter.   Medication Changes: No orders of the defined types were placed in this encounter.   Follow Up:  Either In Person or Virtual in 1 year(s)  Signed, Leanor Kail, PA  04/20/2019 10:05 AM    [place]

## 2019-04-20 ENCOUNTER — Other Ambulatory Visit: Payer: Self-pay

## 2019-04-20 ENCOUNTER — Telehealth (INDEPENDENT_AMBULATORY_CARE_PROVIDER_SITE_OTHER): Payer: PPO | Admitting: Physician Assistant

## 2019-04-20 VITALS — Ht 70.0 in | Wt 272.0 lb

## 2019-04-20 DIAGNOSIS — I350 Nonrheumatic aortic (valve) stenosis: Secondary | ICD-10-CM | POA: Diagnosis not present

## 2019-04-20 DIAGNOSIS — Z7189 Other specified counseling: Secondary | ICD-10-CM

## 2019-04-20 DIAGNOSIS — I5032 Chronic diastolic (congestive) heart failure: Secondary | ICD-10-CM

## 2019-04-20 DIAGNOSIS — I714 Abdominal aortic aneurysm, without rupture, unspecified: Secondary | ICD-10-CM

## 2019-04-20 NOTE — Patient Instructions (Signed)
Medication Instructions:   Your physician recommends that you continue on your current medications as directed. Please refer to the Current Medication list given to you today.  *If you need a refill on your cardiac medications before your next appointment, please call your pharmacy*  Lab Work:  None ordered today  Testing/Procedures:  None ordered today  Follow-Up: At [facility], you and your health needs are our priority.  As part of our continuing mission to provide you with exceptional heart care, we have created designated Provider Care Teams.  These Care Teams include your primary Cardiologist (physician) and Advanced Practice Providers (APPs -  Physician Assistants and Nurse Practitioners) who all work together to provide you with the care you need, when you need it.  Your next appointment:   12 months  The format for your next appointment:   Either In Person or Virtual  Provider:   You may see [name], MD or one of the following Advanced Practice Providers on your designated Care Team:    Richardson Dopp, PA-C  Vin [place], [place]  Daune Perch, [place]

## 2019-04-22 ENCOUNTER — Telehealth: Payer: PPO | Admitting: Physician Assistant

## 2019-05-02 ENCOUNTER — Other Ambulatory Visit: Payer: PPO

## 2019-05-02 ENCOUNTER — Other Ambulatory Visit: Payer: Self-pay | Admitting: Family Medicine

## 2019-05-11 ENCOUNTER — Other Ambulatory Visit: Payer: Self-pay

## 2019-05-24 ENCOUNTER — Other Ambulatory Visit: Payer: PPO

## 2019-06-20 ENCOUNTER — Other Ambulatory Visit: Payer: PPO

## 2019-07-08 ENCOUNTER — Other Ambulatory Visit: Payer: Self-pay | Admitting: Cardiovascular Disease

## 2019-07-19 DIAGNOSIS — G4733 Obstructive sleep apnea (adult) (pediatric): Secondary | ICD-10-CM | POA: Diagnosis not present

## 2019-08-26 ENCOUNTER — Ambulatory Visit: Payer: PPO | Admitting: Cardiovascular Disease

## 2019-08-30 DIAGNOSIS — I872 Venous insufficiency (chronic) (peripheral): Secondary | ICD-10-CM | POA: Diagnosis not present

## 2019-08-30 DIAGNOSIS — L308 Other specified dermatitis: Secondary | ICD-10-CM | POA: Diagnosis not present

## 2019-08-30 DIAGNOSIS — L309 Dermatitis, unspecified: Secondary | ICD-10-CM | POA: Diagnosis not present

## 2019-09-09 ENCOUNTER — Telehealth: Payer: Self-pay | Admitting: Family Medicine

## 2019-09-09 NOTE — Telephone Encounter (Signed)
Noted  

## 2019-09-09 NOTE — Telephone Encounter (Signed)
Patient was seen today by [facility].  Patient's blood pressure was a little high-159/82, but he hadn't taken his bp medication,yet.  He's on  5 L oxygen-sat level 99%.  His pulse was 55.  Patient's doing great.  Patient will be seen next on 10/14/19.

## 2019-09-11 NOTE — Telephone Encounter (Signed)
Noted. Thanks.

## 2019-09-13 DIAGNOSIS — L239 Allergic contact dermatitis, unspecified cause: Secondary | ICD-10-CM | POA: Diagnosis not present

## 2019-09-20 ENCOUNTER — Other Ambulatory Visit: Payer: Self-pay | Admitting: Family Medicine

## 2019-09-20 ENCOUNTER — Other Ambulatory Visit: Payer: PPO

## 2019-09-20 ENCOUNTER — Ambulatory Visit: Payer: PPO

## 2019-09-20 DIAGNOSIS — E559 Vitamin D deficiency, unspecified: Secondary | ICD-10-CM

## 2019-09-20 DIAGNOSIS — I10 Essential (primary) hypertension: Secondary | ICD-10-CM

## 2019-09-23 ENCOUNTER — Other Ambulatory Visit: Payer: PPO

## 2019-09-27 ENCOUNTER — Encounter: Payer: PPO | Admitting: Family Medicine

## 2019-10-18 DIAGNOSIS — G4733 Obstructive sleep apnea (adult) (pediatric): Secondary | ICD-10-CM | POA: Diagnosis not present

## 2019-10-28 ENCOUNTER — Other Ambulatory Visit: Payer: Self-pay

## 2019-10-28 ENCOUNTER — Ambulatory Visit: Payer: PPO

## 2019-10-28 ENCOUNTER — Telehealth: Payer: Self-pay

## 2019-10-28 ENCOUNTER — Other Ambulatory Visit (INDEPENDENT_AMBULATORY_CARE_PROVIDER_SITE_OTHER): Payer: PPO

## 2019-10-28 DIAGNOSIS — I10 Essential (primary) hypertension: Secondary | ICD-10-CM | POA: Diagnosis not present

## 2019-10-28 DIAGNOSIS — E559 Vitamin D deficiency, unspecified: Secondary | ICD-10-CM

## 2019-10-28 LAB — COMPREHENSIVE METABOLIC PANEL
ALT: 9 U/L (ref 0–53)
AST: 16 U/L (ref 0–37)
Albumin: 3.7 g/dL (ref 3.5–5.2)
Alkaline Phosphatase: 92 U/L (ref 39–117)
BUN: 17 mg/dL (ref 6–23)
CO2: 40 mEq/L — ABNORMAL HIGH (ref 19–32)
Calcium: 9 mg/dL (ref 8.4–10.5)
Chloride: 97 mEq/L (ref 96–112)
Creatinine, Ser: 0.99 mg/dL (ref 0.40–1.50)
GFR: 71.99 mL/min (ref >60.00)
Glucose, Bld: 102 mg/dL — ABNORMAL HIGH (ref 70–99)
Potassium: 4.3 mEq/L (ref 3.5–5.1)
Sodium: 141 mEq/L (ref 135–145)
Total Bilirubin: 0.6 mg/dL (ref 0.2–1.2)
Total Protein: 6.6 g/dL (ref 6.0–8.3)

## 2019-10-28 LAB — LIPID PANEL
Cholesterol: 181 mg/dL (ref 0–200)
HDL: 49.6 mg/dL (ref >39.00)
LDL Cholesterol: 111 mg/dL — ABNORMAL HIGH (ref 0–99)
NonHDL: 131.78
Total CHOL/HDL Ratio: 4
Triglycerides: 106 mg/dL (ref 0.0–149.0)
VLDL: 21.2 mg/dL (ref 0.0–40.0)

## 2019-10-28 LAB — VITAMIN D 25 HYDROXY (VIT D DEFICIENCY, FRACTURES): VITD: 18.5 ng/mL — ABNORMAL LOW (ref 30.00–100.00)

## 2019-10-28 NOTE — Telephone Encounter (Signed)
Noted. Thanks.

## 2019-10-28 NOTE — Telephone Encounter (Signed)
FYI- Called patient 4 times trying to complete his medicare visit. Patient never answered the phone. Left HIPAA complaint voicemail notifying patient to call office to reschedule or will be completed at upcoming physical with provider.

## 2019-11-02 ENCOUNTER — Ambulatory Visit (INDEPENDENT_AMBULATORY_CARE_PROVIDER_SITE_OTHER): Payer: PPO

## 2019-11-02 DIAGNOSIS — Z0000 Encounter for general adult medical examination without abnormal findings: Secondary | ICD-10-CM

## 2019-11-02 NOTE — Progress Notes (Signed)
PCP notes:  Health Maintenance: No gaps noted   Abnormal Screenings: none   Patient concerns: Double vision   Nurse concerns: none   Next PCP appt.: 11/04/2019 @ 9:30 am

## 2019-11-02 NOTE — Patient Instructions (Signed)
[name] [name] , Thank you for taking time to come for your Medicare Wellness Visit. I appreciate your ongoing commitment to your health goals. Please review the following plan we discussed and let me know if I can assist you in the future.   Screening recommendations/referrals: Colonoscopy: no longer required Recommended yearly ophthalmology/optometry visit for glaucoma screening and checkup Recommended yearly dental visit for hygiene and checkup  Vaccinations: Influenza vaccine: Fall 2021 Pneumococcal vaccine: Completed series Tdap vaccine: Up to date, completed 01/08/2017 Shingles vaccine: discussed    Advanced directives: Advance directive discussed with you today. Even though you declined this today please call our office should you change your mind and we can give you the proper paperwork for you to fill out.  Conditions/risks identified: hypertension  Next appointment: 11/04/2019 @ 9:30 am   Preventive Care 84 Years and Older, Male Preventive care refers to lifestyle choices and visits with your health care provider that can promote health and wellness. What does preventive care include?  A yearly physical exam. This is also called an annual well check.  Dental exams once or twice a year.  Routine eye exams. Ask your health care provider how often you should have your eyes checked.  Personal lifestyle choices, including:  Daily care of your teeth and gums.  Regular physical activity.  Eating a healthy diet.  Avoiding tobacco and drug use.  Limiting alcohol use.  Practicing safe sex.  Taking low doses of aspirin every day.  Taking vitamin and mineral supplements as recommended by your health care provider. What happens during an annual well check? The services and screenings done by your health care provider during your annual well check will depend on your age, overall health, lifestyle risk factors, and family history of disease. Counseling  Your health care  provider may ask you questions about your:  Alcohol use.  Tobacco use.  Drug use.  Emotional well-being.  Home and relationship well-being.  Sexual activity.  Eating habits.  History of falls.  Memory and ability to understand (cognition).  Work and work Statistician. Screening  You may have the following tests or measurements:  Height, weight, and BMI.  Blood pressure.  Lipid and cholesterol levels. These may be checked every 5 years, or more frequently if you are over 79 years old.  Skin check.  Lung cancer screening. You may have this screening every year starting at age 91 if you have a 30-pack-year history of smoking and currently smoke or have quit within the past 15 years.  Fecal occult blood test (FOBT) of the stool. You may have this test every year starting at age 64.  Flexible sigmoidoscopy or colonoscopy. You may have a sigmoidoscopy every 5 years or a colonoscopy every 10 years starting at age 28.  Prostate cancer screening. Recommendations will vary depending on your family history and other risks.  Hepatitis C blood test.  Hepatitis B blood test.  Sexually transmitted disease (STD) testing.  Diabetes screening. This is done by checking your blood sugar (glucose) after you have not eaten for a while (fasting). You may have this done every 1-3 years.  Abdominal aortic aneurysm (AAA) screening. You may need this if you are a current or former smoker.  Osteoporosis. You may be screened starting at age 54 if you are at high risk. Talk with your health care provider about your test results, treatment options, and if necessary, the need for more tests. Vaccines  Your health care provider may recommend certain vaccines, such  as:  Influenza vaccine. This is recommended every year.  Tetanus, diphtheria, and acellular pertussis (Tdap, Td) vaccine. You may need a Td booster every 10 years.  Zoster vaccine. You may need this after age 35.  Pneumococcal  13-valent conjugate (PCV13) vaccine. One dose is recommended after age 48.  Pneumococcal polysaccharide (PPSV23) vaccine. One dose is recommended after age 79. Talk to your health care provider about which screenings and vaccines you need and how often you need them. This information is not intended to replace advice given to you by your health care provider. Make sure you discuss any questions you have with your health care provider. Document Released: 06/29/2015 Document Revised: 02/20/2016 Document Reviewed: 04/03/2015 Elsevier Interactive Patient Education  2017 [place] Prevention in the Home Falls can cause injuries. They can happen to people of all ages. There are many things you can do to make your home safe and to help prevent falls. What can I do on the outside of my home?  Regularly fix the edges of walkways and driveways and fix any cracks.  Remove anything that might make you trip as you walk through a door, such as a raised step or threshold.  Trim any bushes or trees on the path to your home.  Use bright outdoor lighting.  Clear any walking paths of anything that might make someone trip, such as rocks or tools.  Regularly check to see if handrails are loose or broken. Make sure that both sides of any steps have handrails.  Any raised decks and porches should have guardrails on the edges.  Have any leaves, snow, or ice cleared regularly.  Use sand or salt on walking paths during winter.  Clean up any spills in your garage right away. This includes oil or grease spills. What can I do in the bathroom?  Use night lights.  Install grab bars by the toilet and in the tub and shower. Do not use towel bars as grab bars.  Use non-skid mats or decals in the tub or shower.  If you need to sit down in the shower, use a plastic, non-slip stool.  Keep the floor dry. Clean up any water that spills on the floor as soon as it happens.  Remove soap buildup in the  tub or shower regularly.  Attach bath mats securely with double-sided non-slip rug tape.  Do not have throw rugs and other things on the floor that can make you trip. What can I do in the bedroom?  Use night lights.  Make sure that you have a light by your bed that is easy to reach.  Do not use any sheets or blankets that are too big for your bed. They should not hang down onto the floor.  Have a firm chair that has side arms. You can use this for support while you get dressed.  Do not have throw rugs and other things on the floor that can make you trip. What can I do in the kitchen?  Clean up any spills right away.  Avoid walking on wet floors.  Keep items that you use a lot in easy-to-reach places.  If you need to reach something above you, use a strong step stool that has a grab bar.  Keep electrical cords out of the way.  Do not use floor polish or wax that makes floors slippery. If you must use wax, use non-skid floor wax.  Do not have throw rugs and other things on the  floor that can make you trip. What can I do with my stairs?  Do not leave any items on the stairs.  Make sure that there are handrails on both sides of the stairs and use them. Fix handrails that are broken or loose. Make sure that handrails are as long as the stairways.  Check any carpeting to make sure that it is firmly attached to the stairs. Fix any carpet that is loose or worn.  Avoid having throw rugs at the top or bottom of the stairs. If you do have throw rugs, attach them to the floor with carpet tape.  Make sure that you have a light switch at the top of the stairs and the bottom of the stairs. If you do not have them, ask someone to add them for you. What else can I do to help prevent falls?  Wear shoes that:  Do not have high heels.  Have rubber bottoms.  Are comfortable and fit you well.  Are closed at the toe. Do not wear sandals.  If you use a stepladder:  Make sure that it  is fully opened. Do not climb a closed stepladder.  Make sure that both sides of the stepladder are locked into place.  Ask someone to hold it for you, if possible.  Clearly mark and make sure that you can see:  Any grab bars or handrails.  First and last steps.  Where the edge of each step is.  Use tools that help you move around (mobility aids) if they are needed. These include:  Canes.  Walkers.  Scooters.  Crutches.  Turn on the lights when you go into a dark area. Replace any light bulbs as soon as they burn out.  Set up your furniture so you have a clear path. Avoid moving your furniture around.  If any of your floors are uneven, fix them.  If there are any pets around you, be aware of where they are.  Review your medicines with your doctor. Some medicines can make you feel dizzy. This can increase your chance of falling. Ask your doctor what other things that you can do to help prevent falls. This information is not intended to replace advice given to you by your health care provider. Make sure you discuss any questions you have with your health care provider. Document Released: 03/29/2009 Document Revised: 11/08/2015 Document Reviewed: 07/07/2014 Elsevier Interactive Patient Education  2017 [name].

## 2019-11-02 NOTE — Progress Notes (Signed)
Subjective:   LATRELL POTEMPA is a 86 y.o. male who presents for Medicare Annual/Subsequent preventive examination.  Review of Systems: N/A   I connected with the patient today by telephone and verified that I am speaking with the correct person using two identifiers. Location patient: home Location nurse: work Persons participating in the virtual visit: patient, Marine scientist.   I discussed the limitations, risks, security and privacy concerns of performing an evaluation and management service by telephone and the availability of in person appointments. I also discussed with the patient that there may be a patient responsible charge related to this service. The patient expressed understanding and verbally consented to this telephonic visit.    Interactive audio and video telecommunications were attempted between this nurse and patient, however failed, due to patient having technical difficulties OR patient did not have access to video capability.  We continued and completed visit with audio only.     Cardiac Risk Factors include: advanced age (>22mn, >>33women);hypertension;male gender     Objective:    Vitals: There were no vitals taken for this visit.  There is no height or weight on file to calculate BMI.  Advanced Directives 11/02/2019 03/26/2018 12/03/2016 12/02/2016 11/14/2016 11/29/2015 09/14/2015  Does Patient Have a Medical Advance Directive? _0  No No  Would patient like information on creating a medical advance directive? No - Patient declined No - Patient declined No - Patient declined - - No - patient declined information Yes - EScientist, clinical (histocompatibility and immunogenetics)given    Tobacco Social History   Tobacco Use  Smoking Status Former Smoker  . Packs/day: 1.50  . Years: 53.00  . Pack years: 79.50  . Types: Cigarettes  . Quit date: 06/23/1999  . Years since quitting: 20.3  Smokeless Tobacco Former USystems developer . Types: Snuff, Chew     Counseling given: Not Answered   Clinical  Intake:  Pre-visit preparation completed: Yes  Pain : 0-10 Pain Score: 5  Pain Type: Chronic pain Pain Location: (all over body, feet and legs) Pain Descriptors / Indicators: Aching Pain Onset: More than a month ago Pain Frequency: Constant     Nutritional Risks: None Diabetes: No  How often do you need to have someone help you when you read instructions, pamphlets, or other written materials from your doctor or pharmacy?: 1 - Never What is the last grade level you completed in school?: 9th  Interpreter Needed?: No  Information entered by :: CJohnson, LPN  Past Medical History:  Diagnosis Date  . AAA ([facility]dominal aortic aneurysm) [place]    a. 3.8 cm CTA 05/2015 b. 3.9 cm by [place] 11/2017  . Anxiety   . COPD (chronic obstructive pulmonary disease) (H[facility])    on home O2 4LPM  . Diverticulosis   . Former smoker    quit 2001  . Hemorrhoids   . Kidney stones    passed spontaneously  . OSA on CPAP    last study- in the home possibility- 2015, pt. unsure   . Prediabetes    Hgb A1C 5.8 in Jan 2017  . PUD (peptic ulcer disease)   . Right knee DJD 10/31/2011   Severe  . S/P TAVR (transcatheter aortic valve replacement) 06/26/2015   26 mm Edwards Sapien 3 transcatheter heart valve placed via percutaneous right transfemoral approach  . Severe aortic stenosis    a. Echo 11/16:  Mod LVH, EF 55-60%, no RWMA, Gr 1 DD, severe AS (mean 51 mmHg; peak 86 mmHg),  MAC, mild LAE  b. s/p TAVR on 06/26/2015 with 35m Edward Sapien 3 THV  . Shortness of breath dyspnea    Past Surgical History:  Procedure Laterality Date  . CARDIAC CATHETERIZATION N/A 05/15/2015   Procedure: Right/Left Heart Cath and Coronary Angiography;  Surgeon: MSherren Mocha MD;  [facility];  Service: Cardiovascular;  Laterality: N/A;  . CHOLECYSTECTOMY    . EYE SURGERY     bilateral cataracts removed, /w IOL  . KNEE SURGERY Right 1985   arthrosopic -   . TEE WITHOUT CARDIOVERSION N/A 06/26/2015    Procedure: TRANSESOPHAGEAL ECHOCARDIOGRAM (TEE);  Surgeon: MSherren Mocha MD;  [facility]  Service: Open Heart Surgery;  Laterality: N/A;  . TONSILLECTOMY    . TRANSCATHETER AORTIC VALVE REPLACEMENT, TRANSFEMORAL N/A 06/26/2015   Procedure: TRANSCATHETER AORTIC VALVE REPLACEMENT, TRANSFEMORAL;  Surgeon: MSherren Mocha MD;  [facility]  Service: Open Heart Surgery;  Laterality: N/A;   Family History  Problem Relation Age of Onset  . Stroke Mother   . Heart attack Mother 565      deceased  . Prostate cancer Father 640 . Colon cancer Neg Hx    Social History   Socioeconomic History  . Marital status: Married    Spouse name: Not on file  . Number of children: 1  . Years of education: Not on file  . Highest education level: Not on file  Occupational History  . 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 Smoking status: Former Smoker    Packs/day: 1.50    Years: 53.00    Pack years: 79.50    Types: Cigarettes    Quit date: 06/23/1999    Years since quitting: 20.3  . Smokeless tobacco: Former USystems developer   Types: Snuff, Chew  Substance and Sexual Activity  . Alcohol use: No  . Drug use: No  . Sexual activity: Not Currently  Other Topics Concern  . 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     Social Determinants of Health   Financial Resource Strain: Low Risk   . Difficulty of Paying Living Expenses: Not hard at all  Food Insecurity: No Food Insecurity  . Worried About RCharity fundraiserin the Last Year: Never true  . Ran Out of Food in the Last Year: Never true  Transportation Needs: No Transportation Needs  . Lack of Transportation (Medical): No  . Lack of Transportation (Non-Medical): No  Physical Activity: Inactive  . Days of Exercise per Week: 0 days  . Minutes of Exercise per Session: 0 min   Stress: No Stress Concern Present  . Feeling of Stress : Not at all  Social Connections:   . Frequency of Communication with Friends and Family:   . Frequency of Social Gatherings with Friends and Family:   . Attends Religious Services:   . Active Member of Clubs or Organizations:   . Attends CArchivistMeetings:   .Marland KitchenMarital Status:     Outpatient Encounter Medications as of 11/02/2019  Medication Sig  . albuterol (PROAIR HFA) 108 (90 Base) MCG/ACT inhaler INHALE 1-2 PUFFS INTO THE LUNGS EVERY 6 (SIX) HOURS AS NEEDED FOR WHEEZING.  .Marland Kitchenaspirin EC 81 MG tablet Take 1 tablet (81 mg total) by  mouth daily.  . fluocinonide cream (LIDEX) 6.38 % Apply 1 application topically 2 (two) times daily as needed. Use sparingly.  . furosemide (LASIX) 20 MG tablet TAKE 1 TABLET BY MOUTH DAILY. PLEASE KEEP UPCOMING APPT IN NOVEMBER FOR FUTURE REFILLS. THANK YOU  . guaiFENesin (MUCINEX) 600 MG 12 hr tablet Take 600 mg by mouth 2 (two) times daily as needed for cough or to loosen phlegm.  Marland Kitchen ibuprofen (ADVIL,MOTRIN) 200 MG tablet Take 200 mg by mouth every 6 (six) hours as needed (pain). Reported on 07/30/2015  . LORazepam (ATIVAN) 1 MG tablet TAKE 1/4 TO 1/2 TABLET BY MOUTH EVERY 6 HOURS AS NEEDED  . OXYGEN Inhale into the lungs. 5 Liters  . polyethylene glycol (MIRALAX / GLYCOLAX) packet Take 17 g by mouth daily as needed (constiptation).  Marland Kitchen Respiratory Therapy Supplies (FLUTTER) DEVI Use as directed  . sodium chloride (OCEAN) 0.65 % SOLN nasal spray Place 2 sprays into both nostrils daily as needed for congestion.   . SYMBICORT 160-4.5 MCG/ACT inhaler TAKE 2 PUFFS BY MOUTH TWICE A DAY   No facility-administered encounter medications on file as of 11/02/2019.    Activities of Daily Living In your present state of health, do you have any difficulty performing the following activities: 11/02/2019  Hearing? N  Vision? Y  Comment sees double all the time  Difficulty concentrating or making  decisions? N  Walking or climbing stairs? N  Dressing or bathing? N  Doing errands, shopping? N  Preparing Food and eating ? N  Using the Toilet? N  In the past six months, have you accidently leaked urine? Y  Comment sometimes  Do you have problems with loss of bowel control? N  Managing your Medications? N  Managing your Finances? N  Housekeeping or managing your Housekeeping? N  Some recent data might be hidden    Patient Care Team: Tonia Ghent, MD as PCP - General (Family Medicine) [name], MD as PCP - Cardiology (Cardiology) Noralee Space, MD as Consulting Physician (Pulmonary Disease) Nahser, Wonda Cheng, MD as Consulting Physician (Cardiology)   Assessment:   This is a routine wellness examination for Anant.  Exercise Activities and Dietary recommendations Current Exercise Habits: The patient does not participate in regular exercise at present, Exercise limited by: None identified  Goals    . Increase physical activity     Starting 03/26/2018, I will continue to do arm and leg exercises for 30 min 4-5 days per week.     . Patient Stated     11/02/2019, I will maintain and continue medications as prescribed.        Fall Risk Fall Risk  11/02/2019 05/11/2019 03/26/2018 11/14/2016 12/27/2015  Falls in the past year? 1 1 No Yes No  Comment - Emmi Telephone Survey: data to providers prior to load - pt stated he tripped. no injury or medical treatment -  Number falls in past yr: 1 1 - 1 -  Comment - Emmi Telephone Survey Actual Response = 1 - - -  Injury with Fall? 0 1 - No -  Risk for fall due to : Medication side effect;Impaired balance/gait;History of fall(s) - - - -  Follow up Falls evaluation completed;Falls prevention discussed - - - -   Is the patient's home free of loose throw rugs in walkways, pet beds, electrical cords, etc?   yes      Grab bars in the bathroom? no      Handrails on the stairs?  yes      Adequate lighting?   yes  Timed Get Up and Go  Performed: N/A  Depression Screen PHQ 2/9 Scores 11/02/2019 03/26/2018 11/14/2016 12/27/2015  PHQ - 2 Score 0 0 2 0  PHQ- 9 Score 0 0 7 -    Cognitive Function MMSE - Mini Mental State Exam 11/02/2019 03/26/2018 11/14/2016 09/14/2015  Orientation to time _0 Orientation to Place _1 Registration _2 Attention/ Calculation 5 0 0 0  Recall _3 Recall-comments - - pt was unable to recall 1 of 3 words -  Language- name 2 objects - 0 0 0  Language- repeat _4 Language- follow 3 step command - _5 Language- read & follow direction - 0 0 0  Write a sentence - 0 0 0  Copy design - 0 0 0  Total score - _6 Mini Cog  Mini-Cog screen was completed. Maximum score is 22. A value of 0 denotes this part of the MMSE was not completed or the patient failed this part of the Mini-Cog screening.       Immunization History  Administered Date(s) Administered  . Influenza Split 06/17/2011, 02/23/2012  . Influenza Whole 04/27/2006, 04/10/2009, 04/10/2010  . Influenza, Seasonal, Injecte, Preservative Fre 06/13/2016  . Influenza,inj,Quad PF,6+ Mos 03/17/2013, 04/24/2014, 03/17/2015, 07/15/2016, 03/26/2018  . Pneumococcal Polysaccharide-23 02/15/1999, 10/20/2011  . Td 02/05/2006, 01/08/2017  . Zoster 01/15/2009    Qualifies for Shingles Vaccine: yes   Screening Tests Health Maintenance  Topic Date Due  . COVID-19 Vaccine (1) Never done  . COLONOSCOPY  11/14/2025 (Originally 03/22/2012)  . INFLUENZA VACCINE  01/15/2020  . TETANUS/TDAP  01/09/2027  . PNA vac Low Risk Adult  Completed   Cancer Screenings: Lung: Low Dose CT Chest recommended if Age 39-80 years, 30 pack-year currently smoking OR have quit w/in 15 years. Patient does not qualify. Colorectal: no longer required  Additional Screenings:  Hepatitis C Screening: N/A      Plan:    Patient will maintain and continue medications as prescribed  I have personally reviewed and noted the following in the  patient's chart:   . Medical and social history . Use of alcohol, tobacco or illicit drugs  . Current medications and supplements . Functional ability and status . Nutritional status . Physical activity . Advanced directives . List of other physicians . Hospitalizations, surgeries, and ER visits in previous 12 months . Vitals . Screenings to include cognitive, depression, and falls . Referrals and appointments  In addition, I have reviewed and discussed with patient certain preventive protocols, quality metrics, and best practice recommendations. A written personalized care plan for preventive services as well as general preventive health recommendations were provided to patient.     Hashir, Deleeuw, LPN  [id number]

## 2019-11-04 ENCOUNTER — Other Ambulatory Visit: Payer: Self-pay

## 2019-11-04 ENCOUNTER — Ambulatory Visit (INDEPENDENT_AMBULATORY_CARE_PROVIDER_SITE_OTHER): Payer: PPO | Admitting: Family Medicine

## 2019-11-04 ENCOUNTER — Encounter: Payer: Self-pay | Admitting: Family Medicine

## 2019-11-04 VITALS — BP 144/70 | HR 83 | Temp 96.9°F | Ht 70.0 in | Wt 275.3 lb

## 2019-11-04 DIAGNOSIS — J439 Emphysema, unspecified: Secondary | ICD-10-CM | POA: Diagnosis not present

## 2019-11-04 DIAGNOSIS — E559 Vitamin D deficiency, unspecified: Secondary | ICD-10-CM

## 2019-11-04 DIAGNOSIS — F411 Generalized anxiety disorder: Secondary | ICD-10-CM | POA: Diagnosis not present

## 2019-11-04 DIAGNOSIS — I5032 Chronic diastolic (congestive) heart failure: Secondary | ICD-10-CM

## 2019-11-04 DIAGNOSIS — Z7189 Other specified counseling: Secondary | ICD-10-CM

## 2019-11-04 DIAGNOSIS — Z9181 History of falling: Secondary | ICD-10-CM

## 2019-11-04 DIAGNOSIS — Z0000 Encounter for general adult medical examination without abnormal findings: Secondary | ICD-10-CM

## 2019-11-04 DIAGNOSIS — H538 Other visual disturbances: Secondary | ICD-10-CM

## 2019-11-04 MED ORDER — ALBUTEROL SULFATE HFA 108 (90 BASE) MCG/ACT IN AERS: INHALATION_SPRAY | RESPIRATORY_TRACT | 5 refills | Status: DC

## 2019-11-04 MED ORDER — VITAMIN D (ERGOCALCIFEROL) 1.25 MG (50000 UNIT) PO CAPS: 50000.0000 [IU] | ORAL_CAPSULE | ORAL | 0 refills | Status: DC

## 2019-11-04 MED ORDER — FLUOCINONIDE 0.05 % EX CREA: 1.0000 "application " | TOPICAL_CREAM | Freq: Two times a day (BID) | CUTANEOUS | 1 refills | Status: DC | PRN

## 2019-11-04 MED ORDER — LORATADINE 10 MG PO TABS: 10.0000 mg | ORAL_TABLET | Freq: Every day | ORAL | Status: DC | PRN

## 2019-11-04 NOTE — Patient Instructions (Addendum)
Start vitamin D and recheck a lab in about 3 months when you are done with the rx.  Check with seeing cardiology in the fall.   We'll call about the eye appointment.  Try claritin for itching.  Take care.  Glad to see you.

## 2019-11-04 NOTE — Progress Notes (Signed)
This visit occurred during the SARS-CoV-2 public health emergency.  Safety protocols were in place, including screening questions prior to the visit, additional usage of staff PPE, and extensive cleaning of exam room while observing appropriate contact time as indicated for disinfecting solutions.  S/p TAVR with chronic diastolic CHF.  Still on lasix at baseline.  Still on ASA 81mg  a day.  Minimal swelling.  He will f/u with cardiology.    L upper chest wall ttp after he scratched it a lesion.  He has local skin irritation but is not having chest pain otherwise.    Rare use of BZD, with effect, d/w pt.  No ADE on med.    History of AAA- 3.9 cm by [place] in 11/2017.  No abd pain.  No mass noted by patient.  D/w pt about seeing pulmonary.  He was asking about changing back to [facility].  He had covid vaccine, d/w pt.  He is using O2 persistently at baseline.  No wheeze.  He is compliant with baseline inhaler.  See med list.  Fall cautions d/w pt.  Using walker at baseline.  He fell reaching for an object, with distance judgement affected by vision.    Double vision d/w pt.  "it's been there all along" but worse since ~2019.  He hasn't had recent eye exam.  He has intermittent side to side double vision.  He has blurry vision in the R eye but normal L eye vision.  He isn't driving.   He has some skin itching.  D/w pt about trial of claritin but not claritin D.    Low vit D d/w pt.  See avs.  Discussed with patient about replacement and rechecking vitamin D level.  L foot pain for about 2 weeks, that is getting better.  He'll update me as needed.  No trauma.  Flu shot prev done.  covid vaccine 2021 PNA up to date.  Tetanus 2018 zostavax 2010.   Living will d/w pt.  Wife designated if patient were incapacitated. Defer colon and prostate cancer screening at this point, d/w pt.  Meds, vitals, and allergies reviewed.   ROS: Per HPI unless specifically indicated in ROS section   GEN: nad,  alert and oriented HEENT: PERRL, EOMI.  ncat NECK: supple w/o LA CV: rrr PULM: ctab, no inc wob ABD: soft, +bs EXT: no edema but chronic changes to the skin of the left shin. SKIN: no acute rash but upper chest wall with irritation where he had scratched his skin.  Does not appear infected. Left foot not tender to palpation. CN 2-12 wnl B, S/S wnl x4

## 2019-11-07 DIAGNOSIS — H538 Other visual disturbances: Secondary | ICD-10-CM | POA: Insufficient documentation

## 2019-11-07 DIAGNOSIS — Z0000 Encounter for general adult medical examination without abnormal findings: Secondary | ICD-10-CM | POA: Insufficient documentation

## 2019-11-07 NOTE — Assessment & Plan Note (Signed)
Fall cautions discussed with patient.

## 2019-11-07 NOTE — Assessment & Plan Note (Signed)
Blurry vision/double vision.  Cranial nerves intact.  Refer to ophthalmology.  He agrees.

## 2019-11-07 NOTE — Assessment & Plan Note (Signed)
  S/p TAVR with chronic diastolic CHF.  Still on lasix at baseline.  Still on ASA 81mg  a day.  Minimal swelling.  He will f/u with cardiology.   Continue baseline medication with as needed Lasix.

## 2019-11-07 NOTE — Assessment & Plan Note (Signed)
Continue as needed benzodiazepine.  Routine cautions given to patient.

## 2019-11-07 NOTE — Assessment & Plan Note (Signed)
Low vit D d/w pt.  See avs.  Discussed with patient about replacement and rechecking vitamin D level.

## 2019-11-07 NOTE — Assessment & Plan Note (Signed)
Continue oxygen and Symbicort with as needed albuterol.  Refer to pulmonary.  No wheeze on exam today.  Okay for outpatient follow-up.

## 2019-11-07 NOTE — Assessment & Plan Note (Signed)
Living will d/w pt.  Wife designated if patient were incapacitated.   ?

## 2019-11-07 NOTE — Assessment & Plan Note (Signed)
Flu shot prev done.  covid vaccine 2021 PNA up to date.  Tetanus 2018 zostavax 2010.   Living will d/w pt.  Wife designated if patient were incapacitated. Defer colon and prostate cancer screening at this point, d/w pt.

## 2019-11-23 DIAGNOSIS — H35371 Puckering of macula, right eye: Secondary | ICD-10-CM | POA: Diagnosis not present

## 2019-11-23 DIAGNOSIS — H353131 Nonexudative age-related macular degeneration, bilateral, early dry stage: Secondary | ICD-10-CM | POA: Diagnosis not present

## 2019-11-25 ENCOUNTER — Other Ambulatory Visit: Payer: Self-pay | Admitting: Family Medicine

## 2019-11-25 NOTE — Telephone Encounter (Signed)
Electronic refill request. Vitamin D 50,000 units Last office visit:   11/04/2019 Last Filled:    13 capsule 0 11/04/2019  I suspect this is an auto refill.

## 2019-11-26 NOTE — Telephone Encounter (Signed)
He should not need refill yet.  He was to take it for 3 months and then recheck a level later this summer.

## 2019-11-28 NOTE — Telephone Encounter (Signed)
Patient advised and says they did not request the refill, apparently an auto refill from the pharmacy.  They will call for the lab appointment for later in the summer.

## 2019-12-05 ENCOUNTER — Other Ambulatory Visit: Payer: Self-pay | Admitting: Family Medicine

## 2019-12-06 NOTE — Telephone Encounter (Signed)
Sent. Thanks.   

## 2019-12-20 ENCOUNTER — Ambulatory Visit: Payer: PPO | Admitting: Adult Health

## 2019-12-29 ENCOUNTER — Encounter: Payer: Self-pay | Admitting: Family Medicine

## 2020-01-05 ENCOUNTER — Other Ambulatory Visit: Payer: Self-pay

## 2020-01-05 ENCOUNTER — Encounter: Payer: Self-pay | Admitting: Pulmonary Disease

## 2020-01-05 ENCOUNTER — Telehealth: Payer: Self-pay | Admitting: Pulmonary Disease

## 2020-01-05 ENCOUNTER — Ambulatory Visit: Payer: PPO | Admitting: Pulmonary Disease

## 2020-01-05 VITALS — BP 122/68 | HR 80 | Temp 97.7°F | Ht 70.0 in | Wt 265.0 lb

## 2020-01-05 DIAGNOSIS — J9611 Chronic respiratory failure with hypoxia: Secondary | ICD-10-CM | POA: Diagnosis not present

## 2020-01-05 DIAGNOSIS — Z9989 Dependence on other enabling machines and devices: Secondary | ICD-10-CM

## 2020-01-05 DIAGNOSIS — J9612 Chronic respiratory failure with hypercapnia: Secondary | ICD-10-CM | POA: Diagnosis not present

## 2020-01-05 DIAGNOSIS — G4733 Obstructive sleep apnea (adult) (pediatric): Secondary | ICD-10-CM | POA: Diagnosis not present

## 2020-01-05 DIAGNOSIS — J449 Chronic obstructive pulmonary disease, unspecified: Secondary | ICD-10-CM

## 2020-01-05 DIAGNOSIS — J0100 Acute maxillary sinusitis, unspecified: Secondary | ICD-10-CM

## 2020-01-05 MED ORDER — AMOXICILLIN-POT CLAVULANATE 875-125 MG PO TABS: 1.0000 | ORAL_TABLET | Freq: Two times a day (BID) | ORAL | 0 refills | Status: DC

## 2020-01-05 NOTE — Progress Notes (Signed)
[name]  Chief Complaint  Patient presents with   Follow-up    pt reports sob with exertion and occ dry cough at times prod with white mucus. wearing cpap nightly- feels pressure is okay. considering changing mask    Constitutional:  BP 122/68 (BP Location: Left Arm, Cuff Size: Normal)    Pulse 80    Temp 97.7 F (36.5 C) (Temporal)    Ht 5\' 10"  (1.778 m)    Wt 265 lb (120.2 kg) Comment: weight is per pt   SpO2 97%    BMI 38.02 kg/m   Past Medical History:  AAA, Anxiety, Diverticulosis, Nephrolithiasis, Pre-DM, PUD, severe AS s/p TAVR  Summary:  [name] is a 86 y.o. male  former smoker OSA, COPD, and chronic respiratory failure.  Subjective:  He has noticed more sinus congestion and pressure.  Ears feel full.  Getting yellow drainage and some headaches.  Using 5 liters oxygen 24/7.  Not having much cough.  Denies wheeze, chest congestion, chest pain, leg swelling, or leg cramps.  Occasionally gets nose bleeds.  Uses CPAP nightly.  Has some mask leak and wants to try new mask.  Feels pressure is okay otherwise.  Needs a walker to get around at home.    Physical Exam:   Appearance - well kempt  ENMT - mild maxillary sinus tenderness, yellow nasal discharge, no oral exudate, Mallampati 3, wears dentures, TM clear b/l  Respiratory - decreased BS, no wheeze, or rales  CV - regular rate and rhythm, no murmurs  GI - soft, non tender  Lymph - no adenopathy noted in neck  Ext - no edema  Skin - no rashes  Neuro - normal strength, oriented x 3  Psych - normal mood and affect  Assessment/Plan:   Acute maxillary sinusitis with intermittent epistaxis. - course of augmentin - advised him to try using saline nasal spray  COPD. - had skin reaction from prior nebulized medication use - previous trial of LAMA was ineffective  continue symbicort and prn albuterol  Obstructive sleep apnea. - he is compliant with  CPAP - continue CPAP 14 cm H2O - will arrange for mask refitting with his DME  Chronic respiratory failure with hypoxia and hypercapnia. - continue 5 liters oxygen 24/7, including with CPAP at night  A total of  32 minutes spent addressing patient care issues on day of visit.  Follow up:  Patient Instructions  Augmentin one pill twice per day for 7 days  Try using saline nasal gel daily as needed  Will arrange for CPAP mask refitting  Follow up in 6 months   Signature:  Chesley Mires, MD [place] Pager: [phone] 01/05/2020, 9:56 AM  Flow Sheet     Pulmonary tests:   Spirometry 04/11/15 >> FEV1 0.77 (24%), FEV1% 50  Chest imaging:   CT chest 05/22/15 >> 4 mm RUL nodule  Sleep tests:   PSG 12/13/99 >> AHI 33  CPAP 12/06/19 to 01/04/20 >> used on 30 of 30 nights with average 10 hrs 59 min.  Average AHI 13 with CPAP 4 cm H2O  Cardiac tests:   Rt/Lt heart cath 05/15/15 >> severe AS, mild pulmonary hypertension  Echo 07/13/16 >> mod LVH, EF 60 to 65%, grade 1 DD, bioprosthetic AV, mild MR  Medications:   Allergies as of 01/05/2020      Reactions   Iohexol Hives, Swelling   Pt reports swelling, redness, hives, and blisters  Atorvastatin    REACTION: aches   Celecoxib    REACTION: rash   Clinoril [sulindac]    REACTION: rash   Nitrofurantoin    Spiriva Handihaler [tiotropium Bromide Monohydrate] Other (See Comments)   Dry mouth   Sulfa Antibiotics    Intolerant but unrecalled.       Medication List       Accurate as of January 05, 2020  9:56 AM. If you have any questions, ask your nurse or doctor.        albuterol 108 (90 Base) MCG/ACT inhaler Commonly known as: ProAir HFA INHALE 1-2 PUFFS INTO THE LUNGS EVERY 6 (SIX) HOURS AS NEEDED FOR WHEEZING.   amoxicillin-clavulanate 875-125 MG tablet Commonly known as: AUGMENTIN Take 1 tablet by mouth 2 (two) times daily. Started by: Chesley Mires, MD   aspirin EC 81 MG tablet Take  1 tablet (81 mg total) by mouth daily.   fluocinonide cream 0.05 % Commonly known as: LIDEX Apply 1 application topically 2 (two) times daily as needed. Use sparingly.   Flutter Devi Use as directed   furosemide 20 MG tablet Commonly known as: LASIX TAKE 1 TABLET BY MOUTH DAILY. PLEASE KEEP UPCOMING APPT IN NOVEMBER FOR FUTURE REFILLS. THANK YOU   guaiFENesin 600 MG 12 hr tablet Commonly known as: MUCINEX Take 600 mg by mouth 2 (two) times daily as needed for cough or to loosen phlegm.   ibuprofen 200 MG tablet Commonly known as: ADVIL Take 200 mg by mouth every 6 (six) hours as needed (pain). Reported on 07/30/2015   loratadine 10 MG tablet Commonly known as: CLARITIN Take 1 tablet (10 mg total) by mouth daily as needed for itching.   LORazepam 1 MG tablet Commonly known as: ATIVAN TAKE 1/4 TO 1/2 TABLET BY MOUTH EVERY 6 HOURS AS NEEDED   OXYGEN Inhale into the lungs. 5 Liters   sodium chloride 0.65 % Soln nasal spray Commonly known as: OCEAN Place 2 sprays into both nostrils daily as needed for congestion.   Symbicort 160-4.5 MCG/ACT inhaler Generic drug: budesonide-formoterol TAKE 2 PUFFS BY MOUTH TWICE A DAY   Vitamin D (Ergocalciferol) 1.25 MG (50000 UNIT) Caps capsule Commonly known as: DRISDOL Take 1 capsule (50,000 Units total) by mouth every 7 (seven) days.       Past Surgical History:  He  has a past surgical history that includes Knee surgery (Right, 1985); Cholecystectomy; Tonsillectomy; Cardiac catheterization (N/A, 05/15/2015); Eye surgery; Transcatheter aortic valve replacement, transfemoral (N/A, 06/26/2015); and TEE without cardioversion (N/A, 06/26/2015).  Family History:  His family history includes Heart attack (age of onset: 81) in his mother; Prostate cancer in his brother; Prostate cancer (age of onset: 65) in his father; Stroke in his mother.  Social History:  He  reports that he quit smoking about 20 years ago. His smoking use included  cigarettes. He has a 79.50 pack-year smoking history. He has quit using smokeless tobacco.  His smokeless tobacco use included snuff and chew. He reports that he does not drink alcohol and does not use drugs.

## 2020-01-05 NOTE — Telephone Encounter (Signed)
Tonya with [facility] called and stated that RT did a virtual visit with patient today regarding his CPAP Mask refit.  Pt asked the RT at [facility] if [name] would send out a sample mask and the RT advised the patient that [facility] didn't send out sample masks, however, if he tried the new mask and didn't like it he could exchange it out within 30 days.    Since [facility] doesn't send out sample masks, pt declined mask fit.    Just FYI for physician. Rhonda J Cobb

## 2020-01-05 NOTE — Patient Instructions (Signed)
Augmentin one pill twice per day for 7 days  Try using saline nasal gel daily as needed  Will arrange for CPAP mask refitting  Follow up in 6 months

## 2020-01-06 NOTE — Telephone Encounter (Signed)
Noted.  [name] Mires, MD [place] Pager - [phone] 01/06/2020, 8:14 AM

## 2020-01-07 ENCOUNTER — Other Ambulatory Visit: Payer: Self-pay | Admitting: Family Medicine

## 2020-01-09 NOTE — Telephone Encounter (Signed)
Electronic refill request. Vitamin D 50,000 Last office visit:   11/04/2019 Last Filled:    13 capsule 0 11/04/2019  Please advise.

## 2020-01-10 NOTE — Telephone Encounter (Signed)
Patient scheduled for lab (Vit D) tomorrow.

## 2020-01-10 NOTE — Telephone Encounter (Signed)
Needs recheck vit D first.

## 2020-01-11 ENCOUNTER — Other Ambulatory Visit: Payer: PPO

## 2020-01-18 DIAGNOSIS — G4733 Obstructive sleep apnea (adult) (pediatric): Secondary | ICD-10-CM | POA: Diagnosis not present

## 2020-01-30 ENCOUNTER — Other Ambulatory Visit: Payer: PPO

## 2020-02-01 ENCOUNTER — Other Ambulatory Visit: Payer: PPO

## 2020-02-14 ENCOUNTER — Other Ambulatory Visit: Payer: PPO

## 2020-02-17 ENCOUNTER — Other Ambulatory Visit (INDEPENDENT_AMBULATORY_CARE_PROVIDER_SITE_OTHER): Payer: PPO

## 2020-02-17 ENCOUNTER — Other Ambulatory Visit: Payer: Self-pay

## 2020-02-17 DIAGNOSIS — E559 Vitamin D deficiency, unspecified: Secondary | ICD-10-CM

## 2020-02-17 LAB — VITAMIN D 25 HYDROXY (VIT D DEFICIENCY, FRACTURES): VITD: 31.06 ng/mL (ref 30.00–100.00)

## 2020-02-20 ENCOUNTER — Other Ambulatory Visit: Payer: Self-pay | Admitting: Family Medicine

## 2020-02-20 MED ORDER — VITAMIN D 50 MCG (2000 UT) PO CAPS: 2000.0000 [IU] | ORAL_CAPSULE | Freq: Every day | ORAL | Status: DC

## 2020-03-20 ENCOUNTER — Telehealth: Payer: Self-pay | Admitting: *Deleted

## 2020-03-20 NOTE — Telephone Encounter (Signed)
Patient states he has had this problem for a couple of years but it does seem to be getting worse.  Eye MD said that he couldn't give him any glasses or anything to help it.  Patient will call in if he wishes to get an appointment with Dr. Damita Dunnings.

## 2020-03-20 NOTE — Telephone Encounter (Signed)
Samandra NP with [facility] left a voicemail stating that she saw the patient today and he was complaining of double vision for about two weeks. Landry Dyke stated that the patient saw his eye doctor and he saw no reason for the patient having double vision. Landry Dyke stated that she wanted Dr. Damita Dunnings to know about the double vision so that he could follow-up with the patient.

## 2020-03-20 NOTE — Telephone Encounter (Signed)
He needs eval for double vision.  I understand his sx are clearly longer than just a few hours or a day.  If any new sx at this point, then to ER.  O/w needs eval here in the clinic as soon as feasible.  Thanks.

## 2020-03-21 NOTE — Telephone Encounter (Signed)
If it has been going on for years then that clearly changes the situation.  I will defer to patient to schedule follow-up here since this is not an acute issue.  Thanks.

## 2020-04-19 DIAGNOSIS — G4733 Obstructive sleep apnea (adult) (pediatric): Secondary | ICD-10-CM | POA: Diagnosis not present

## 2020-04-27 ENCOUNTER — Other Ambulatory Visit: Payer: Self-pay | Admitting: Cardiovascular Disease

## 2020-05-25 ENCOUNTER — Other Ambulatory Visit: Payer: Self-pay | Admitting: Cardiovascular Disease

## 2020-05-31 ENCOUNTER — Other Ambulatory Visit: Payer: Self-pay | Admitting: Family Medicine

## 2020-06-11 ENCOUNTER — Other Ambulatory Visit: Payer: Self-pay | Admitting: Cardiovascular Disease

## 2020-06-26 ENCOUNTER — Other Ambulatory Visit: Payer: Self-pay | Admitting: Cardiovascular Disease

## 2020-06-28 DIAGNOSIS — Z87891 Personal history of nicotine dependence: Secondary | ICD-10-CM | POA: Diagnosis not present

## 2020-06-28 DIAGNOSIS — J9610 Chronic respiratory failure, unspecified whether with hypoxia or hypercapnia: Secondary | ICD-10-CM | POA: Diagnosis not present

## 2020-06-28 DIAGNOSIS — Z9981 Dependence on supplemental oxygen: Secondary | ICD-10-CM | POA: Diagnosis not present

## 2020-06-28 DIAGNOSIS — J449 Chronic obstructive pulmonary disease, unspecified: Secondary | ICD-10-CM | POA: Diagnosis not present

## 2020-07-10 DIAGNOSIS — R600 Localized edema: Secondary | ICD-10-CM | POA: Diagnosis not present

## 2020-07-10 DIAGNOSIS — S8991XA Unspecified injury of right lower leg, initial encounter: Secondary | ICD-10-CM | POA: Diagnosis not present

## 2020-07-10 DIAGNOSIS — R531 Weakness: Secondary | ICD-10-CM | POA: Diagnosis not present

## 2020-07-10 DIAGNOSIS — M6281 Muscle weakness (generalized): Secondary | ICD-10-CM | POA: Diagnosis not present

## 2020-07-11 DIAGNOSIS — M7989 Other specified soft tissue disorders: Secondary | ICD-10-CM | POA: Diagnosis not present

## 2020-07-11 DIAGNOSIS — R52 Pain, unspecified: Secondary | ICD-10-CM | POA: Diagnosis not present

## 2020-07-20 DIAGNOSIS — G4733 Obstructive sleep apnea (adult) (pediatric): Secondary | ICD-10-CM | POA: Diagnosis not present

## 2020-07-24 DIAGNOSIS — Z09 Encounter for follow-up examination after completed treatment for conditions other than malignant neoplasm: Secondary | ICD-10-CM | POA: Diagnosis not present

## 2020-07-24 DIAGNOSIS — Z87440 Personal history of urinary (tract) infections: Secondary | ICD-10-CM | POA: Diagnosis not present

## 2020-08-15 ENCOUNTER — Encounter: Payer: Self-pay | Admitting: Family Medicine

## 2020-08-15 ENCOUNTER — Ambulatory Visit (INDEPENDENT_AMBULATORY_CARE_PROVIDER_SITE_OTHER): Payer: PPO | Admitting: Family Medicine

## 2020-08-15 VITALS — BP 140/70 | HR 74 | Temp 98.4°F | Ht 70.0 in

## 2020-08-15 DIAGNOSIS — J449 Chronic obstructive pulmonary disease, unspecified: Secondary | ICD-10-CM

## 2020-08-15 DIAGNOSIS — J9610 Chronic respiratory failure, unspecified whether with hypoxia or hypercapnia: Secondary | ICD-10-CM | POA: Diagnosis not present

## 2020-08-15 DIAGNOSIS — M1711 Unilateral primary osteoarthritis, right knee: Secondary | ICD-10-CM | POA: Diagnosis not present

## 2020-08-15 MED ORDER — TRIAMCINOLONE ACETONIDE 40 MG/ML IJ SUSP
40.0000 mg | Freq: Once | INTRAMUSCULAR | Status: AC
  Administered 2020-08-15: 40 mg via INTRA_ARTICULAR

## 2020-08-15 NOTE — Progress Notes (Signed)
[name] Pauli T. [name], MD, CAQ Sports Medicine  Primary Care and Sports Medicine [facility] [address] [place], [address]  Phone: [phone]  FAX: [phone]  [name] [name] - 87 y.o. male  MRN [id number]  Date of Birth: 09-Oct-1933  Date: 08/15/2020  PCP: [name] Nam, MD  Referral: [name] Nam, MD  Chief Complaint  Patient presents with  . Knee Pain    Right    This visit occurred during the SARS-CoV-2 public health emergency.  Safety protocols were in place, including screening questions prior to the visit, additional usage of staff PPE, and extensive cleaning of exam room while observing appropriate contact time as indicated for disinfecting solutions.   Subjective:   [name] [name] is a 87 y.o. very pleasant male patient with Body mass index is 38.02 kg/m. who presents with the following:  The patient is here to follow-up on 2 distinct issues.  #1 he does have known bilateral knee osteoarthritis.  I looked at some x-rays even from a few years ago, the patient did have significant osteoarthritis at that time.  He also has chronic respiratory failure and is on oxygen for severe COPD.  This point he is having quite a bit of difficulty even ambulating with a walker and getting out of the home.  This does limited his quality of life and is limiting his basic activities as well.  Review of Systems is noted in the HPI, as appropriate   Objective:   BP 140/70   Pulse 74   Temp 98.4 F (36.9 C) (Temporal)   Ht 5\' 10"  (1.778 m)   SpO2 98% Comment: 5 L O2  BMI 38.02 kg/m   I examined him in the wheelchair.  He does have tenderness on both the medial and lateral joint line and when compression of the patella.  He is only able to flex his knee to about 100 degrees.  Stable to varus and valgus stress.  ACL and PCL are intact.  Radiology: No results found.  Assessment and Plan:     ICD-10-CM   1. Primary osteoarthritis of  right knee  M17.11 triamcinolone acetonide (KENALOG-40) injection 40 mg    DME Wheelchair manual  2. Chronic respiratory failure, unspecified whether with hypoxia or hypercapnia (HCC)  J96.10 DME Wheelchair manual  3. COPD mixed type (HCC)  J44.9 DME Wheelchair manual   Chronic knee osteoarthritis, I think is reasonable to inject the patient's knee. We will also attempt to get him a large wheelchair.  Aspiration/Injection Procedure Note [name] [name] September 15, 1933 Date of procedure: 08/15/2020  Procedure: Large Joint Aspiration / Injection of Knee, R Indications: Pain  Procedure Details Patient verbally consented to procedure. Risks, benefits, and alternatives explained. Sterilely prepped with Chloraprep. Ethyl cholride used for anesthesia. 9 cc Lidocaine 1% mixed with 1 mL of Kenalog 40 mg injected using the anteromedial approach without difficulty. No complications with procedure and tolerated well. Patient had decreased pain post-injection. Medication: 1 mL of Kenalog 40 mg  Home Health documentation: Face to face encounter documentation follows: Patient needs home health services for the following reasons:  Wheelchair DMI Patient homebound for the following reasons who: Patient suffers from chronic hypoxic respiratory failure which impairs their ability to perform daily activities like bathing and dress in the home.  A walker will not resolve issue with performing activities of daily living. A wheelchair will allow patient to safely perform daily activities. Patient can safely propel  the wheelchair in the home or has a caregiver who can provide assistance. Length of need Lifetime. Accessories: elevating leg rests (ELRs), wheel locks, extensions and anti-tippers.   Meds ordered this encounter  Medications  . triamcinolone acetonide (KENALOG-40) injection 40 mg   There are no discontinued medications. Orders Placed This Encounter  Procedures  . DME Wheelchair manual    Follow-up: No  follow-ups on file.  Signed,  [name] Galea. Duana Benedict, MD   Outpatient Encounter Medications as of 08/15/2020  Medication Sig  . albuterol (PROAIR HFA) 108 (90 Base) MCG/ACT inhaler INHALE 1-2 PUFFS INTO THE LUNGS EVERY 6 (SIX) HOURS AS NEEDED FOR WHEEZING.  Marland Kitchen amoxicillin-clavulanate (AUGMENTIN) 875-125 MG tablet Take 1 tablet by mouth 2 (two) times daily.  Marland Kitchen aspirin EC 81 MG tablet Take 1 tablet (81 mg total) by mouth daily.  . Cholecalciferol (VITAMIN D) 50 MCG (2000 UT) CAPS Take 1 capsule (2,000 Units total) by mouth daily.  . fluocinonide cream (LIDEX) 0.05 % Apply 1 application topically 2 (two) times daily as needed. Use sparingly.  . furosemide (LASIX) 20 MG tablet TAKE 1 TABLET BY MOUTH DAILY. PLEASE SCHEDULE OVERDUE APPT FOR FURTHER REFILLS. LAST ATTEMPT  . guaiFENesin (MUCINEX) 600 MG 12 hr tablet Take 600 mg by mouth 2 (two) times daily as needed for cough or to loosen phlegm.  Marland Kitchen ibuprofen (ADVIL,MOTRIN) 200 MG tablet Take 200 mg by mouth every 6 (six) hours as needed (pain). Reported on 07/30/2015  . loratadine (CLARITIN) 10 MG tablet Take 1 tablet (10 mg total) by mouth daily as needed for itching.  Marland Kitchen LORazepam (ATIVAN) 1 MG tablet TAKE 1/4 TO 1/2 TABLET BY MOUTH EVERY 6 HOURS AS NEEDED  . OXYGEN Inhale into the lungs. 5 Liters  . Respiratory Therapy Supplies (FLUTTER) DEVI Use as directed  . sodium chloride (OCEAN) 0.65 % SOLN nasal spray Place 2 sprays into both nostrils daily as needed for congestion.   . SYMBICORT 160-4.5 MCG/ACT inhaler TAKE 2 PUFFS BY MOUTH TWICE A DAY  . [EXPIRED] triamcinolone acetonide (KENALOG-40) injection 40 mg    No facility-administered encounter medications on file as of 08/15/2020.

## 2020-08-16 DIAGNOSIS — M1711 Unilateral primary osteoarthritis, right knee: Secondary | ICD-10-CM | POA: Diagnosis not present

## 2020-08-16 DIAGNOSIS — J9610 Chronic respiratory failure, unspecified whether with hypoxia or hypercapnia: Secondary | ICD-10-CM | POA: Diagnosis not present

## 2020-08-16 DIAGNOSIS — J449 Chronic obstructive pulmonary disease, unspecified: Secondary | ICD-10-CM | POA: Diagnosis not present

## 2020-08-30 DIAGNOSIS — Z87891 Personal history of nicotine dependence: Secondary | ICD-10-CM | POA: Diagnosis not present

## 2020-08-30 DIAGNOSIS — J449 Chronic obstructive pulmonary disease, unspecified: Secondary | ICD-10-CM | POA: Diagnosis not present

## 2020-08-30 DIAGNOSIS — I5030 Unspecified diastolic (congestive) heart failure: Secondary | ICD-10-CM | POA: Diagnosis not present

## 2020-08-30 DIAGNOSIS — D692 Other nonthrombocytopenic purpura: Secondary | ICD-10-CM | POA: Diagnosis not present

## 2020-08-30 DIAGNOSIS — Z7982 Long term (current) use of aspirin: Secondary | ICD-10-CM | POA: Diagnosis not present

## 2020-08-30 DIAGNOSIS — G4733 Obstructive sleep apnea (adult) (pediatric): Secondary | ICD-10-CM | POA: Diagnosis not present

## 2020-08-30 DIAGNOSIS — Z9981 Dependence on supplemental oxygen: Secondary | ICD-10-CM | POA: Diagnosis not present

## 2020-08-30 DIAGNOSIS — Z6835 Body mass index (BMI) 35.0-35.9, adult: Secondary | ICD-10-CM | POA: Diagnosis not present

## 2020-08-30 DIAGNOSIS — J9610 Chronic respiratory failure, unspecified whether with hypoxia or hypercapnia: Secondary | ICD-10-CM | POA: Diagnosis not present

## 2020-08-30 DIAGNOSIS — I714 Abdominal aortic aneurysm, without rupture: Secondary | ICD-10-CM | POA: Diagnosis not present

## 2020-08-30 DIAGNOSIS — E261 Secondary hyperaldosteronism: Secondary | ICD-10-CM | POA: Diagnosis not present

## 2020-09-27 ENCOUNTER — Telehealth: Payer: Self-pay | Admitting: Cardiovascular Disease

## 2020-09-27 NOTE — Telephone Encounter (Signed)
Sure this is fine with me. thanks

## 2020-09-27 NOTE — Telephone Encounter (Signed)
New Message:    Pt would like to switch provider. He says [place] is closer to where he lives.h

## 2020-10-01 NOTE — Telephone Encounter (Signed)
That is fine with me.  Nelva Bush, MD [facility]

## 2020-11-19 DIAGNOSIS — J449 Chronic obstructive pulmonary disease, unspecified: Secondary | ICD-10-CM | POA: Diagnosis not present

## 2020-11-19 DIAGNOSIS — M1711 Unilateral primary osteoarthritis, right knee: Secondary | ICD-10-CM | POA: Diagnosis not present

## 2020-11-19 DIAGNOSIS — J9610 Chronic respiratory failure, unspecified whether with hypoxia or hypercapnia: Secondary | ICD-10-CM | POA: Diagnosis not present

## 2020-11-23 DIAGNOSIS — H26491 Other secondary cataract, right eye: Secondary | ICD-10-CM | POA: Diagnosis not present

## 2020-11-29 DIAGNOSIS — Z7951 Long term (current) use of inhaled steroids: Secondary | ICD-10-CM | POA: Diagnosis not present

## 2020-11-29 DIAGNOSIS — Z6837 Body mass index (BMI) 37.0-37.9, adult: Secondary | ICD-10-CM | POA: Diagnosis not present

## 2020-11-29 DIAGNOSIS — E261 Secondary hyperaldosteronism: Secondary | ICD-10-CM | POA: Diagnosis not present

## 2020-11-29 DIAGNOSIS — J9610 Chronic respiratory failure, unspecified whether with hypoxia or hypercapnia: Secondary | ICD-10-CM | POA: Diagnosis not present

## 2020-11-29 DIAGNOSIS — D692 Other nonthrombocytopenic purpura: Secondary | ICD-10-CM | POA: Diagnosis not present

## 2020-11-29 DIAGNOSIS — Z7982 Long term (current) use of aspirin: Secondary | ICD-10-CM | POA: Diagnosis not present

## 2020-11-29 DIAGNOSIS — G4733 Obstructive sleep apnea (adult) (pediatric): Secondary | ICD-10-CM | POA: Diagnosis not present

## 2020-11-29 DIAGNOSIS — I110 Hypertensive heart disease with heart failure: Secondary | ICD-10-CM | POA: Diagnosis not present

## 2020-11-29 DIAGNOSIS — I714 Abdominal aortic aneurysm, without rupture: Secondary | ICD-10-CM | POA: Diagnosis not present

## 2020-11-29 DIAGNOSIS — J449 Chronic obstructive pulmonary disease, unspecified: Secondary | ICD-10-CM | POA: Diagnosis not present

## 2020-11-29 DIAGNOSIS — I5030 Unspecified diastolic (congestive) heart failure: Secondary | ICD-10-CM | POA: Diagnosis not present

## 2020-12-16 DIAGNOSIS — J449 Chronic obstructive pulmonary disease, unspecified: Secondary | ICD-10-CM | POA: Diagnosis not present

## 2020-12-16 DIAGNOSIS — J9610 Chronic respiratory failure, unspecified whether with hypoxia or hypercapnia: Secondary | ICD-10-CM | POA: Diagnosis not present

## 2020-12-16 DIAGNOSIS — M1711 Unilateral primary osteoarthritis, right knee: Secondary | ICD-10-CM | POA: Diagnosis not present

## 2020-12-19 DIAGNOSIS — M1711 Unilateral primary osteoarthritis, right knee: Secondary | ICD-10-CM | POA: Diagnosis not present

## 2020-12-19 DIAGNOSIS — J9610 Chronic respiratory failure, unspecified whether with hypoxia or hypercapnia: Secondary | ICD-10-CM | POA: Diagnosis not present

## 2020-12-19 DIAGNOSIS — J449 Chronic obstructive pulmonary disease, unspecified: Secondary | ICD-10-CM | POA: Diagnosis not present

## 2021-01-05 ENCOUNTER — Other Ambulatory Visit: Payer: Self-pay | Admitting: Family Medicine

## 2021-01-07 NOTE — Telephone Encounter (Signed)
Refill request for Lorazepam 1 mg tablets  LOV - 08/15/20 Next OV - not scheduled Last refill - 12/06/19 #30/1

## 2021-01-08 NOTE — Telephone Encounter (Signed)
Sent. Thanks.   

## 2021-01-16 ENCOUNTER — Other Ambulatory Visit: Payer: Self-pay

## 2021-01-16 ENCOUNTER — Encounter: Payer: Self-pay | Admitting: Internal Medicine

## 2021-01-16 ENCOUNTER — Other Ambulatory Visit
Admission: RE | Admit: 2021-01-16 | Discharge: 2021-01-16 | Disposition: A | Discharge status: Home / Self Care | Payer: PPO | Source: Ambulatory Visit | Attending: Internal Medicine | Admitting: Internal Medicine

## 2021-01-16 ENCOUNTER — Ambulatory Visit: Payer: PPO | Admitting: Internal Medicine

## 2021-01-16 VITALS — BP 150/70 | HR 53 | Ht 70.0 in | Wt 265.0 lb

## 2021-01-16 DIAGNOSIS — I10 Essential (primary) hypertension: Secondary | ICD-10-CM | POA: Diagnosis not present

## 2021-01-16 DIAGNOSIS — J9611 Chronic respiratory failure with hypoxia: Secondary | ICD-10-CM | POA: Diagnosis not present

## 2021-01-16 DIAGNOSIS — I5032 Chronic diastolic (congestive) heart failure: Secondary | ICD-10-CM

## 2021-01-16 DIAGNOSIS — R9431 Abnormal electrocardiogram [ECG] [EKG]: Secondary | ICD-10-CM | POA: Diagnosis not present

## 2021-01-16 DIAGNOSIS — Z79899 Other long term (current) drug therapy: Secondary | ICD-10-CM | POA: Diagnosis not present

## 2021-01-16 DIAGNOSIS — I350 Nonrheumatic aortic (valve) stenosis: Secondary | ICD-10-CM

## 2021-01-16 DIAGNOSIS — Z952 Presence of prosthetic heart valve: Secondary | ICD-10-CM | POA: Diagnosis not present

## 2021-01-16 DIAGNOSIS — I459 Conduction disorder, unspecified: Secondary | ICD-10-CM | POA: Diagnosis not present

## 2021-01-16 LAB — COMPREHENSIVE METABOLIC PANEL
ALT: 10 U/L (ref 0–44)
AST: 19 U/L (ref 15–41)
Albumin: 3.4 g/dL — ABNORMAL LOW (ref 3.5–5.0)
Alkaline Phosphatase: 90 U/L (ref 38–126)
Anion gap: 9 (ref 5–15)
BUN: 20 mg/dL (ref 8–23)
CO2: 31 mmol/L (ref 22–32)
Calcium: 8.8 mg/dL — ABNORMAL LOW (ref 8.9–10.3)
Chloride: 99 mmol/L (ref 98–111)
Creatinine, Ser: 1.09 mg/dL (ref 0.61–1.24)
GFR, Estimated: >60 mL/min (ref >60)
Glucose, Bld: 91 mg/dL (ref 70–99)
Potassium: 4.7 mmol/L (ref 3.5–5.1)
Sodium: 139 mmol/L (ref 135–145)
Total Bilirubin: 1 mg/dL (ref 0.3–1.2)
Total Protein: 6.8 g/dL (ref 6.5–8.1)

## 2021-01-16 LAB — CBC
HCT: 34 % — ABNORMAL LOW (ref 39.0–52.0)
Hemoglobin: 10.7 g/dL — ABNORMAL LOW (ref 13.0–17.0)
MCH: 31.3 pg (ref 26.0–34.0)
MCHC: 31.5 g/dL (ref 30.0–36.0)
MCV: 99.4 fL (ref 80.0–100.0)
Platelets: 213 10*3/uL (ref 150–400)
RBC: 3.42 MIL/uL — ABNORMAL LOW (ref 4.22–5.81)
RDW: 12.9 % (ref 11.5–15.5)
WBC: 7.8 10*3/uL (ref 4.0–10.5)
nRBC: 0 % (ref 0.0–0.2)

## 2021-01-16 LAB — LIPID PANEL
Cholesterol: 176 mg/dL (ref 0–200)
HDL: 53 mg/dL (ref >40)
LDL Cholesterol: 108 mg/dL — ABNORMAL HIGH (ref 0–99)
Total CHOL/HDL Ratio: 3.3 RATIO
Triglycerides: 77 mg/dL (ref <150)
VLDL: 15 mg/dL (ref 0–40)

## 2021-01-16 LAB — BRAIN NATRIURETIC PEPTIDE: B Natriuretic Peptide: 35.6 pg/mL (ref 0.0–100.0)

## 2021-01-16 MED ORDER — FUROSEMIDE 40 MG PO TABS: 40.0000 mg | ORAL_TABLET | Freq: Every day | ORAL | 3 refills | Status: DC

## 2021-01-16 NOTE — Patient Instructions (Signed)
Medication Instructions:   Please increase your Lasix to 40 mg daily New script sent in to pharmacy  *If you need a refill on your cardiac medications before your next appointment, please call your pharmacy*   Lab Work: CBC, CMP, BNP, & Lipid  LABS WILL APPEAR ON MYCHART, ABNORMAL RESULTS WILL BE CALLED  Testing/Procedures: Echo  Your physician has requested that you have an echocardiogram. Echocardiography is a painless test that uses sound waves to create images of your heart. It provides your doctor with information about the size and shape of your heart and how well your heart's chambers and valves are working. This procedure takes approximately one hour. There are no restrictions for this procedure.  There is a possibility that an IV may need to be started during your test to inject an image enhancing agent. This is done to obtain more optimal pictures of your heart. Therefore we ask that you do at least drink some water prior to coming in to hydrate your veins.     Follow-Up: At [facility], you and your health needs are our priority.  As part of our continuing mission to provide you with exceptional heart care, we have created designated Provider Care Teams.  These Care Teams include your primary Cardiologist (physician) and Advanced Practice Providers (APPs -  Physician Assistants and Nurse Practitioners) who all work together to provide you with the care you need, when you need it.  We recommend signing up for the patient portal called "MyChart".  Sign up information is provided on this After Visit Summary.  MyChart is used to connect with patients for Virtual Visits (Telemedicine).  Patients are able to view lab/test results, encounter notes, upcoming appointments, etc.  Non-urgent messages can be sent to your provider as well.   To learn more about what you can do with MyChart, go to [email or web address].    Your next appointment:   6 week(s)  The format for your next  appointment:   In Person  Provider:   You may see Dr. Saunders Revel or one of the following Advanced Practice Providers on your designated Care Team:   Murray Hodgkins, NP Christell Faith, PA-C Marrianne Mood, PA-C Cadence Christine, [place]

## 2021-01-16 NOTE — Progress Notes (Signed)
Follow-up Outpatient Visit Date: 01/16/2021  Primary Care Provider: Tonia Ghent, MD [address] [place] [address]  Chief Complaint: Follow-up valvular heart disease  HPI:  [name] [name] is a 87 y.o. male with history of severe aortic stenosis status post TAVR (06/2015), chronic HFpEF, chronic respiratory failure with hypoxia, COPD, AAA, obstructive sleep apnea, and morbid obesity, who presents for follow-up of aortic valve disease and chronic HFpEF.  He was previously followed in [facility] by Dr. Burt Knack and was last evaluated via virtual visit by Robbie Lis, [place], in 04/2019.  He was doing well at that time, remaining on 5 L of supplemental oxygen.  No testing or intervention was undertaken.  Today, [name] [name] reports he has been feeling relatively well.  He has stable exertional dyspnea and continues to wear 5 L of supplemental oxygen.  He has not had any chest pain but reports chronic intermittent lightheadedness that has been present for years (it predates his TAVR).  He has stable calf edema and has previously been told by a vein specialist that there are no treatments available.  He denies orthopnea.  He wears CPAP at night.  He reports having gained weight over the last few years, which he attributes to dietary indiscretion.  --------------------------------------------------------------------------------------------------  Past Medical History:  Diagnosis Date   AAA (abdominal aortic aneurysm) ([place])    a. 3.8 cm CTA 05/2015 b. 3.9 cm by [place] in 11/2017   Anxiety    COPD (chronic obstructive pulmonary disease) ([place])    on home O2 4LPM   Diverticulosis    Former smoker    quit 2001   Hemorrhoids    Kidney stones    passed spontaneously   OSA on CPAP    last study- in the home possibility- 2015, pt. unsure    Prediabetes    Hgb A1C 5.8 in Jan 2017   PUD (peptic ulcer disease)    Right knee DJD 10/31/2011   Severe   S/P TAVR (transcatheter aortic valve replacement)  06/26/2015   26 mm Edwards Sapien 3 transcatheter heart valve placed via percutaneous right transfemoral approach   Severe aortic stenosis    a. Echo 11/16:  Mod LVH, EF 55-60%, no RWMA, Gr 1 DD, severe AS (mean 51 mmHg; peak 86 mmHg), MAC, mild LAE  b. s/p TAVR on 06/26/2015 with 26m Edward Sapien 3 THV   Shortness of breath dyspnea    Past Surgical History:  Procedure Laterality Date   CARDIAC CATHETERIZATION N/A 05/15/2015   Procedure: Right/Left Heart Cath and Coronary Angiography;  Surgeon: MSherren Mocha MD;  [facility];  Service: Cardiovascular;  Laterality: N/A;   CHOLECYSTECTOMY     EYE SURGERY     bilateral cataracts removed, /w IOL   KNEE SURGERY Right 1985   arthrosopic -    TEE WITHOUT CARDIOVERSION N/A 06/26/2015   Procedure: TRANSESOPHAGEAL ECHOCARDIOGRAM (TEE);  Surgeon: MSherren Mocha MD;  [facility]  Service: Open Heart Surgery;  Laterality: N/A;   TONSILLECTOMY     TRANSCATHETER AORTIC VALVE REPLACEMENT, TRANSFEMORAL N/A 06/26/2015   Procedure: TRANSCATHETER AORTIC VALVE REPLACEMENT, TRANSFEMORAL;  Surgeon: MSherren Mocha MD;  [facility]  Service: Open Heart Surgery;  Laterality: N/A;    Current Meds  Medication Sig   albuterol (PROAIR HFA) 108 (90 Base) MCG/ACT inhaler INHALE 1-2 PUFFS INTO THE LUNGS EVERY 6 (SIX) HOURS AS NEEDED FOR WHEEZING.   aspirin EC 81 MG tablet Take 1 tablet (81 mg total)  by mouth daily.   furosemide (LASIX) 40 MG tablet Take 1 tablet (40 mg total) by mouth daily.   guaiFENesin (MUCINEX) 600 MG 12 hr tablet Take 600 mg by mouth 2 (two) times daily as needed for cough or to loosen phlegm.   ibuprofen (ADVIL,MOTRIN) 200 MG tablet Take 200 mg by mouth every 6 (six) hours as needed (pain). Reported on 07/30/2015   loratadine (CLARITIN) 10 MG tablet Take 1 tablet (10 mg total) by mouth daily as needed for itching.   LORazepam (ATIVAN) 1 MG tablet TAKE 1/4 TO 1/2 TABLET BY MOUTH EVERY 6 HOURS AS NEEDED   OXYGEN Inhale  into the lungs. 5 Liters   Respiratory Therapy Supplies (FLUTTER) DEVI Use as directed   sodium chloride (OCEAN) 0.65 % SOLN nasal spray Place 2 sprays into both nostrils daily as needed for congestion.    SYMBICORT 160-4.5 MCG/ACT inhaler TAKE 2 PUFFS BY MOUTH TWICE A DAY   [DISCONTINUED] furosemide (LASIX) 20 MG tablet TAKE 1 TABLET BY MOUTH DAILY. PLEASE SCHEDULE OVERDUE APPT FOR FURTHER REFILLS. LAST ATTEMPT    Allergies: Iohexol, Atorvastatin, Celecoxib, Clinoril [sulindac], Nitrofurantoin, Spiriva handihaler [tiotropium bromide monohydrate], and Sulfa antibiotics  Social History   Tobacco Use   Smoking status: Former    Packs/day: 1.50    Years: 53.00    Pack years: 79.50    Types: Cigarettes    Quit date: 06/23/1999    Years since quitting: 21.5   Smokeless tobacco: Former    Types: Snuff, Chew  Vaping Use   Vaping Use: Never used  Substance Use Topics   Alcohol use: No   Drug use: No    Family History  Problem Relation Age of Onset   Stroke Mother    Heart attack Mother 76       deceased   Prostate cancer Father 90   Prostate cancer Brother    Colon cancer Neg Hx     Review of Systems: A 12-system review of systems was performed and was negative except as noted in the HPI.  --------------------------------------------------------------------------------------------------  Physical Exam: BP (!) 150/70 (BP Location: Right Arm, Patient Position: Sitting, Cuff Size: Large)   Pulse (!) 53   Ht _0  (1.778 m)   Wt 265 lb (120.2 kg)   SpO2 97% Comment: on 5L O2  BMI 38.02 kg/m   General:  NAD. Neck: No JVD or HJR. Lungs: Diminished breath sounds bilaterally with scattered rhonchi.  No crackles. Heart: Distant heart sounds.  Bradycardic but regular with 1/6 systolic murmur. Abdomen: Soft, nontender, nondistended. Extremities: 1+ woody edema in both calves with overlying erythema and scaliness.  EKG: Normal sinus rhythm with 2:1 AV block and septal infarct.   Heart rate has decreased with 2:1 AV block now present compered with prior tracing on 11/20/2017.  Lab Results  Component Value Date   WBC 7.8 01/16/2021   HGB 10.7 (L) 01/16/2021   HCT 34.0 (L) 01/16/2021   MCV 99.4 01/16/2021   PLT 213 01/16/2021    Lab Results  Component Value Date   NA 139 01/16/2021   K 4.7 01/16/2021   CL 99 01/16/2021   CO2 31 01/16/2021   BUN 20 01/16/2021   CREATININE 1.09 01/16/2021   GLUCOSE 91 01/16/2021   ALT 10 01/16/2021    Lab Results  Component Value Date   CHOL 176 01/16/2021   HDL 53 01/16/2021   LDLCALC 108 (H) 01/16/2021   TRIG 77 01/16/2021   CHOLHDL 3.3 01/16/2021    --------------------------------------------------------------------------------------------------  ASSESSMENT AND PLAN: Aortic stenosis and chronic HFpEF: [name] [name] is status post TAVR in 2017, with most recent echo having been completed in 2019.  Given chronic leg edema as well as lightheadedness, we will repeat an echocardiogram to assess LVEF and valve function.  We will increase furosemide to 40 mg daily.  I will check a CBC, CMP, and BNP today.  2:1 AV block: EKG initially thought to represent sinus bradycardia though on further review it is most consistent with normal sinus rhythm with 2:1 AV block.  Though lightheadedness has been a longstanding, dating back to before TAVR when EKGs showed normal AV conduction, it is conceivable that some of his symptoms could be related to bradycardia.  We have reached out to the patient to discuss the findings and arrange for EP consultation.  He is not on any AV nodal blocking agents; we will continue to avoid these medications.  TSH will be checked.  Chronic respiratory failure with hypoxia: Predominantly due to COPD though an element of HFpEF cannot be excluded.  As above, we will repeat an echo.  Continue supplemental oxygen and current pulmonary medications as directed by his pulmonary team.  Hypertension: Blood pressure  mildly elevated today.  We will escalate furosemide, which may offer modest improvement in blood pressure.  Defer additional medication changes at this time.  Follow-up: Return to clinic in 6 weeks.  Nelva Bush, MD 01/17/2021 7:07 AM

## 2021-01-17 ENCOUNTER — Encounter: Payer: Self-pay | Admitting: Internal Medicine

## 2021-01-17 ENCOUNTER — Telehealth: Payer: Self-pay | Admitting: Internal Medicine

## 2021-01-17 DIAGNOSIS — G4733 Obstructive sleep apnea (adult) (pediatric): Secondary | ICD-10-CM | POA: Diagnosis not present

## 2021-01-17 DIAGNOSIS — I459 Conduction disorder, unspecified: Secondary | ICD-10-CM | POA: Insufficient documentation

## 2021-01-17 DIAGNOSIS — R9431 Abnormal electrocardiogram [ECG] [EKG]: Secondary | ICD-10-CM

## 2021-01-17 LAB — TSH: TSH: 3.299 u[IU]/mL (ref 0.350–4.500)

## 2021-01-17 NOTE — Telephone Encounter (Signed)
I attempted to reach Mr. Shade to follow-up after our visit yesterday afternoon.  Initial interpretation of yesterday's EKG was sinus bradycardia.  However, upon further inspection today, it is more consistent with normal sinus rhythm with 2:1 AV block.  I left a message for him to contact the office to discuss this further.  I recommend that he be seen by EP to determine if he would benefit from pacemaker placement.  Notably, he is not on any AV-nodal blocking agents.  We will attempt to add a TSH onto his labs that were collected yesterday.  Other than modest anemia, no significant abnormality was identified.  Mr. Lade should continue his current medications and proceed with echocardiogram, as discussed at yesterday's office visit.  Nelva Bush, MD [facility]

## 2021-01-17 NOTE — Telephone Encounter (Signed)
Attempted to schedule per avs

## 2021-01-17 NOTE — Telephone Encounter (Signed)
Spoke to [facility] lab, TSH has been added to lab work drawn yesterday.  Will wait for pt's return call and discuss Dr. Darnelle Bos recc below including placing EP referral.

## 2021-01-18 NOTE — Telephone Encounter (Signed)
End, Harrell Gave, MD  Solmon Ice, RN Labs notable for moderate anemia, with hemoglobin somewhat lower compared with 2 years ago.  Kidney function, electrolytes, thyroid function, and liver function are reasonable.  Bad cholesterol is mildly elevated.  I recommend that Mr. Epps continue current medications and move forward with EP consultation(see telephone note for details).   Attempted to call pt to review lab results in note above. Also to discuss previous message regarding EKG and Dr. Darnelle Bos recc for EP referral. Pt also needs to be set up for an echo and 6 week follow up.  No answer at this time. Called both number's listed, lmtcb.

## 2021-01-21 NOTE — Telephone Encounter (Signed)
Attempted to schedule.  LMOV to call office.  ° °

## 2021-01-21 NOTE — Telephone Encounter (Signed)
I spoke with [name] and his wife on Friday (01/18/21) by phone regarding labs results and EKG findings consistent with 2:1 AV block.  [name] reports stable chronic dizziness and shortness of breath, unchanged from our visit with him last week.  He is agreeable to EP consultation.  I recommended further workup of his anemia per Dr. Damita Dunnings at their next visit.  Nelva Bush, MD [facility]

## 2021-01-23 NOTE — Telephone Encounter (Signed)
Attempted to schedule.  LMOV to call office.  ° °

## 2021-01-30 NOTE — Telephone Encounter (Signed)
Attempted to call pt on both home and cell number listed. Lmtcb on home phone voicemail. Pt's cell has no vm set up.  Per DPR, may speak with pt's wife and daughter. Pt's wife number listed is same as husband's home number.  Attempted to also call pt's daughter, unable to leave vm.   After multiple unsuccessful attempts to reach pt, letter mailed asking pt to contact our office so that we may schedule appointments.   Note: Pt needs Echo, EP referral and 6 week f/u scheduled when he calls back.

## 2021-02-08 ENCOUNTER — Encounter: Payer: Self-pay | Admitting: Internal Medicine

## 2021-03-05 DIAGNOSIS — R300 Dysuria: Secondary | ICD-10-CM | POA: Diagnosis not present

## 2021-03-05 DIAGNOSIS — N390 Urinary tract infection, site not specified: Secondary | ICD-10-CM | POA: Diagnosis not present

## 2021-03-05 DIAGNOSIS — W19XXXD Unspecified fall, subsequent encounter: Secondary | ICD-10-CM | POA: Diagnosis not present

## 2021-03-05 DIAGNOSIS — M533 Sacrococcygeal disorders, not elsewhere classified: Secondary | ICD-10-CM | POA: Diagnosis not present

## 2021-03-07 ENCOUNTER — Ambulatory Visit: Payer: PPO

## 2021-03-07 ENCOUNTER — Other Ambulatory Visit: Payer: Self-pay

## 2021-03-13 ENCOUNTER — Ambulatory Visit (INDEPENDENT_AMBULATORY_CARE_PROVIDER_SITE_OTHER): Payer: PPO | Admitting: Internal Medicine

## 2021-03-13 ENCOUNTER — Other Ambulatory Visit: Payer: Self-pay

## 2021-03-13 ENCOUNTER — Ambulatory Visit: Payer: PPO | Admitting: Cardiology

## 2021-03-13 ENCOUNTER — Encounter: Payer: Self-pay | Admitting: Internal Medicine

## 2021-03-13 DIAGNOSIS — I83009 Varicose veins of unspecified lower extremity with ulcer of unspecified site: Secondary | ICD-10-CM | POA: Insufficient documentation

## 2021-03-13 DIAGNOSIS — L03115 Cellulitis of right lower limb: Secondary | ICD-10-CM | POA: Diagnosis not present

## 2021-03-13 DIAGNOSIS — I83012 Varicose veins of right lower extremity with ulcer of calf: Secondary | ICD-10-CM | POA: Diagnosis not present

## 2021-03-13 DIAGNOSIS — L97909 Non-pressure chronic ulcer of unspecified part of unspecified lower leg with unspecified severity: Secondary | ICD-10-CM | POA: Insufficient documentation

## 2021-03-13 DIAGNOSIS — L97219 Non-pressure chronic ulcer of right calf with unspecified severity: Secondary | ICD-10-CM

## 2021-03-13 MED ORDER — CEPHALEXIN 500 MG PO CAPS: 500.0000 mg | ORAL_CAPSULE | Freq: Three times a day (TID) | ORAL | 1 refills | Status: DC

## 2021-03-13 NOTE — Assessment & Plan Note (Signed)
Fissures have led to infection Discussed keeping dressing on---done here Edema is not worse and he has lost weight--so will hold off on changing diuretic dose

## 2021-03-13 NOTE — Progress Notes (Signed)
Subjective:    Patient ID: [name] [name], male    DOB: 21-Feb-1934, 87 y.o.   MRN: [id number]  HPI Here with daughter Neoma Laming due to weeping in his legs This visit occurred during the SARS-CoV-2 public health emergency.  Safety protocols were in place, including screening questions prior to the visit, additional usage of staff PPE, and extensive cleaning of exam room while observing appropriate contact time as indicated for disinfecting solutions.   Has been have weeping from his legs---redness in legs goes back 30 years Has had weeping over that time as well--intermittently May have been related to using celebrex  Has worsened in the past few weeks Some pain----increased Does take furosemide daily  Current Outpatient Medications on File Prior to Visit  Medication Sig Dispense Refill   albuterol (PROAIR HFA) 108 (90 Base) MCG/ACT inhaler INHALE 1-2 PUFFS INTO THE LUNGS EVERY 6 (SIX) HOURS AS NEEDED FOR WHEEZING. 8.5 g 5   aspirin EC 81 MG tablet Take 1 tablet (81 mg total) by mouth daily.     furosemide (LASIX) 40 MG tablet Take 1 tablet (40 mg total) by mouth daily. 90 tablet 3   guaiFENesin (MUCINEX) 600 MG 12 hr tablet Take 600 mg by mouth 2 (two) times daily as needed for cough or to loosen phlegm.     ibuprofen (ADVIL,MOTRIN) 200 MG tablet Take 200 mg by mouth every 6 (six) hours as needed (pain). Reported on 07/30/2015     loratadine (CLARITIN) 10 MG tablet Take 1 tablet (10 mg total) by mouth daily as needed for itching.     LORazepam (ATIVAN) 1 MG tablet TAKE 1/4 TO 1/2 TABLET BY MOUTH EVERY 6 HOURS AS NEEDED 30 tablet 1   OXYGEN Inhale into the lungs. 5 Liters     Respiratory Therapy Supplies (FLUTTER) DEVI Use as directed 1 each 0   sodium chloride (OCEAN) 0.65 % SOLN nasal spray Place 2 sprays into both nostrils daily as needed for congestion.      SYMBICORT 160-4.5 MCG/ACT inhaler TAKE 2 PUFFS BY MOUTH TWICE A DAY 30.6 each 3   No current facility-administered medications on  file prior to visit.    Allergies  Allergen Reactions   Iohexol Hives and Swelling    Pt reports swelling, redness, hives, and blisters    Atorvastatin     REACTION: aches   Celecoxib     REACTION: rash   Clinoril [Sulindac]     REACTION: rash   Nitrofurantoin    Spiriva Handihaler [Tiotropium Bromide Monohydrate] Other (See Comments)    Dry mouth   Sulfa Antibiotics     Intolerant but unrecalled.     Past Medical History:  Diagnosis Date   AAA (abdominal aortic aneurysm) ([place])    a. 3.8 cm CTA 05/2015 b. 3.9 cm by [place] in 11/2017   Anxiety    COPD (chronic obstructive pulmonary disease) ([place])    on home O2 4LPM   Diverticulosis    Former smoker    quit 2001   Hemorrhoids    Kidney stones    passed spontaneously   OSA on CPAP    last study- in the home possibility- 2015, pt. unsure    Prediabetes    Hgb A1C 5.8 in Jan 2017   PUD (peptic ulcer disease)    Right knee DJD 10/31/2011   Severe   S/P TAVR (transcatheter aortic valve replacement) 06/26/2015   26 mm Edwards Sapien 3 transcatheter heart valve placed via percutaneous right  transfemoral approach   Severe aortic stenosis    a. Echo 11/16:  Mod LVH, EF 55-60%, no RWMA, Gr 1 DD, severe AS (mean 51 mmHg; peak 86 mmHg), MAC, mild LAE  b. s/p TAVR on 06/26/2015 with 39m Edward Sapien 3 THV   Shortness of breath dyspnea     Past Surgical History:  Procedure Laterality Date   CARDIAC CATHETERIZATION N/A 05/15/2015   Procedure: Right/Left Heart Cath and Coronary Angiography;  Surgeon: MSherren Mocha MD;  [facility];  Service: Cardiovascular;  Laterality: N/A;   CHOLECYSTECTOMY     EYE SURGERY     bilateral cataracts removed, /w IOL   KNEE SURGERY Right 1985   arthrosopic -    TEE WITHOUT CARDIOVERSION N/A 06/26/2015   Procedure: TRANSESOPHAGEAL ECHOCARDIOGRAM (TEE);  Surgeon: MSherren Mocha MD;  [place] [facility]  Service: Open Heart Surgery;  Laterality: N/A;   TONSILLECTOMY     TRANSCATHETER  AORTIC VALVE REPLACEMENT, TRANSFEMORAL N/A 06/26/2015   Procedure: TRANSCATHETER AORTIC VALVE REPLACEMENT, TRANSFEMORAL;  Surgeon: MSherren Mocha MD;  [place] [facility]  Service: Open Heart Surgery;  Laterality: N/A;    Family History  Problem Relation Age of Onset   Stroke Mother    Heart attack Mother 573      deceased   Prostate cancer Father 621  Prostate cancer Brother    Colon cancer Neg Hx     Social History   Socioeconomic History   Marital status: Married    Spouse name: Not on file   Number of children: 1   Years of education: Not on file   Highest education level: Not on file  Occupational History   Occupation: Retired    Comment: [employer]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     Social Determinants of Health   Financial Resource Strain: Not on file  Food Insecurity: Not on file  Transportation Needs: Not on file  Physical Activity: Not on file  Stress: Not on file  Social Connections: Not on file  Intimate Partner Violence: Not on file   Review of Systems No chest pain No SOB---on oxygen Weight has been coming down recently--cutting back on eating    Objective:   Physical Exam Constitutional:      Appearance: Normal appearance.  Musculoskeletal:     Comments: 1+ reedy swelling in calves--up to knees Fissures and blister on right. Redness, warmth and tenderness Left just has chronic changes  Neurological:     Mental  Status: He is alert.           Assessment & Plan:

## 2021-03-13 NOTE — Assessment & Plan Note (Signed)
Complicating chronic venous stasis Leg dressed with neosporin/cling and coban (daughter can continue this) Will treat with cephalexin for 1 week

## 2021-03-14 DIAGNOSIS — R238 Other skin changes: Secondary | ICD-10-CM | POA: Diagnosis not present

## 2021-03-14 DIAGNOSIS — M7989 Other specified soft tissue disorders: Secondary | ICD-10-CM | POA: Diagnosis not present

## 2021-03-21 ENCOUNTER — Ambulatory Visit: Payer: PPO | Admitting: Physician Assistant

## 2021-03-27 ENCOUNTER — Ambulatory Visit: Payer: PPO | Admitting: Internal Medicine

## 2021-04-02 NOTE — Progress Notes (Deleted)
Electrophysiology Office Note:    Date:  04/02/2021   ID:  [name] [name], DOB [birth year], MRN [id number]  PCP:  Tonia Ghent, MD  [facility] Cardiologist:  [name], MD  [facility] Electrophysiologist:  Vickie Epley, MD   Referring MD: Nelva Bush, MD   Chief Complaint: AV block  History of Present Illness:    [name] [name] is a 87 y.o. male who presents for an evaluation of AV block at the request of Dr. Saunders Revel. Their medical history includes severe aortic stenosis post TAVR in 6213, chronic diastolic heart failure, COPD.  He saw Dr. Saunders Revel in follow-up on January 16, 2021.  The patient reported stable oxygen requirement of 5 L/min.  He also reported lightheadedness at that visit.  During that visit his EKG showed 2-1 AV block and he was referred to EP.     Past Medical History:  Diagnosis Date   AAA (abdominal aortic aneurysm) ([place])    a. 3.8 cm CTA 05/2015 b. 3.9 cm by [place] in 11/2017   Anxiety    COPD (chronic obstructive pulmonary disease) ([place])    on home O2 4LPM   Diverticulosis    Former smoker    quit 2001   Hemorrhoids    Kidney stones    passed spontaneously   OSA on CPAP    last study- in the home possibility- 2015, pt. unsure    Prediabetes    Hgb A1C 5.8 in Jan 2017   PUD (peptic ulcer disease)    Right knee DJD 10/31/2011   Severe   S/P TAVR (transcatheter aortic valve replacement) 06/26/2015   26 mm Edwards Sapien 3 transcatheter heart valve placed via percutaneous right transfemoral approach   Severe aortic stenosis    a. Echo 11/16:  Mod LVH, EF 55-60%, no RWMA, Gr 1 DD, severe AS (mean 51 mmHg; peak 86 mmHg), MAC, mild LAE  b. s/p TAVR on 06/26/2015 with 40m Edward Sapien 3 THV   Shortness of breath dyspnea     Past Surgical History:  Procedure Laterality Date   CARDIAC CATHETERIZATION N/A 05/15/2015   Procedure: Right/Left Heart Cath and Coronary Angiography;  Surgeon: MSherren Mocha MD;  [place] [facility];  Service:  Cardiovascular;  Laterality: N/A;   CHOLECYSTECTOMY     EYE SURGERY     bilateral cataracts removed, /w IOL   KNEE SURGERY Right 1985   arthrosopic -    TEE WITHOUT CARDIOVERSION N/A 06/26/2015   Procedure: TRANSESOPHAGEAL ECHOCARDIOGRAM (TEE);  Surgeon: MSherren Mocha MD;  [place]  Service: Open Heart Surgery;  Laterality: N/A;   TONSILLECTOMY     TRANSCATHETER AORTIC VALVE REPLACEMENT, TRANSFEMORAL N/A 06/26/2015   Procedure: TRANSCATHETER AORTIC VALVE REPLACEMENT, TRANSFEMORAL;  Surgeon: MSherren Mocha MD;  [place] [facility]  Service: Open Heart Surgery;  Laterality: N/A;    Current Medications: No outpatient medications have been marked as taking for the 04/03/21 encounter (Appointment) with LVickie Epley MD.     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     Social Determinants of Health   Financial Resource Strain: Not on file  Food Insecurity: Not on file  Transportation Needs: Not on file  Physical Activity: Not on  file  Stress: Not on file  Social Connections: Not on file     Family History: The patient's family history includes Heart attack (age of onset: 20) in his mother; Prostate cancer in his brother; Prostate cancer (age of onset: 59) in his father; Stroke in his mother. There is no history of Colon cancer.  ROS:   Please see the history of present illness.    All other systems reviewed and are negative.  EKGs/Labs/Other Studies Reviewed:    The following studies were reviewed today:  January 16, 2021 EKG personally reviewed Sinus rhythm 2-1 AV block Narrow QRS   EKG:  The ekg ordered today demonstrates ***   Recent Labs: 01/16/2021: ALT 10; B Natriuretic Peptide 35.6; BUN 20; Creatinine, Ser 1.09; Hemoglobin 10.7; Platelets 213; Potassium 4.7; Sodium 139; TSH 3.299  Recent Lipid Panel    Component Value Date/Time   CHOL 176 01/16/2021 1738   TRIG 77 01/16/2021 1738   HDL 53 01/16/2021 1738   CHOLHDL 3.3 01/16/2021 1738   VLDL 15 01/16/2021 1738   LDLCALC 108 (H) 01/16/2021 1738    Physical Exam:    VS:  There were no vitals taken for this visit.    Wt Readings from Last 3 Encounters:  03/13/21 260 lb (117.9 kg)  01/16/21 265 lb (120.2 kg)  01/05/20 265 lb (120.2 kg)     GEN: *** Well nourished, well developed in no acute distress HEENT: Normal NECK: No JVD; No carotid bruits LYMPHATICS: No lymphadenopathy CARDIAC: ***RRR, no murmurs, rubs, gallops RESPIRATORY:  Clear to auscultation without rales, wheezing or rhonchi  ABDOMEN: Soft, non-tender, non-distended MUSCULOSKELETAL:  No edema; No deformity  SKIN: Warm and dry NEUROLOGIC:  Alert and oriented x 3 PSYCHIATRIC:  Normal affect       ASSESSMENT:    1. S/P TAVR (transcatheter aortic valve replacement)   2. Heart block   3. Chronic heart failure with preserved ejection fraction (HFpEF) ([place])   4. Chronic respiratory failure with hypoxia (HCC)    PLAN:    In order of problems listed above:   ?   Pacemaker versus ZIO      Total time spent with patient today *** minutes. This includes reviewing records, evaluating the patient and coordinating care.  Medication Adjustments/Labs and Tests Ordered: Current medicines are reviewed at length with the patient today.  Concerns regarding medicines are outlined above.  No orders of the defined types were placed in this encounter.  No orders of the defined types were placed in this encounter.    Signed, Hilton Cork. Quentin Ore, MD, [facility], [facility] 04/02/2021 10:24 PM  Electrophysiology [facility]

## 2021-04-03 ENCOUNTER — Ambulatory Visit: Payer: PPO | Admitting: Cardiology

## 2021-04-03 DIAGNOSIS — Z952 Presence of prosthetic heart valve: Secondary | ICD-10-CM

## 2021-04-03 DIAGNOSIS — I459 Conduction disorder, unspecified: Secondary | ICD-10-CM

## 2021-04-03 DIAGNOSIS — I5032 Chronic diastolic (congestive) heart failure: Secondary | ICD-10-CM

## 2021-04-03 DIAGNOSIS — J9611 Chronic respiratory failure with hypoxia: Secondary | ICD-10-CM

## 2021-04-11 ENCOUNTER — Ambulatory Visit (INDEPENDENT_AMBULATORY_CARE_PROVIDER_SITE_OTHER): Payer: PPO

## 2021-04-11 ENCOUNTER — Other Ambulatory Visit: Payer: Self-pay

## 2021-04-11 DIAGNOSIS — Z952 Presence of prosthetic heart valve: Secondary | ICD-10-CM | POA: Diagnosis not present

## 2021-04-11 DIAGNOSIS — I350 Nonrheumatic aortic (valve) stenosis: Secondary | ICD-10-CM

## 2021-04-11 LAB — ECHOCARDIOGRAM COMPLETE
AR max vel: 1.57 cm2
AV Area VTI: 1.56 cm2
AV Area mean vel: 1.54 cm2
AV Mean grad: 14.3 mmHg
AV Peak grad: 27.9 mmHg
Ao pk vel: 2.64 m/s
Area-P 1/2: 2.21 cm2
MV VTI: 1.89 cm2
S' Lateral: 4 cm
Single Plane A4C EF: 71.3 %

## 2021-04-11 MED ORDER — PERFLUTREN LIPID MICROSPHERE
1.0000 mL | INTRAVENOUS | Status: AC | PRN
Start: 2021-04-11 — End: 2021-04-11
  Administered 2021-04-11: 2 mL via INTRAVENOUS

## 2021-04-16 NOTE — Progress Notes (Signed)
Electrophysiology Office Note:    Date:  04/17/2021   ID:  SHERMAR FRIEDLAND, DOB Aug 08, 1933, MRN [id number]  PCP:  Tonia Ghent, MD  [facility] Cardiologist:  [name], MD  [facility] Electrophysiologist:  Vickie Epley, MD   Referring MD: Nelva Bush, MD   Chief Complaint: abnormal ECG  History of Present Illness:    [name] [name] is a 87 y.o. male who presents for an evaluation of abnormal ECG at the request of Dr End. Their medical history includes AS s/p TAVR in 2017, COPD, AAA, OSA, morbid obesity, chronic respiratory failure. They were last seen by Dr End on 01/16/2021. He uses 5lpm [place].  At that appointment his ECG was noted to be abnormal with 2:1 conduction.   Today he is doing well.  He is in his wheelchair.  He uses 5 L/min through nasal cannula 24/7.  No syncope or presyncope.   Past Medical History:  Diagnosis Date   AAA (abdominal aortic aneurysm)    a. 3.8 cm CTA 05/2015 b. 3.9 cm by [place] in 11/2017   Anxiety    COPD (chronic obstructive pulmonary disease) ([place])    on home O2 4LPM   Diverticulosis    Former smoker    quit 2001   Hemorrhoids    Kidney stones    passed spontaneously   OSA on CPAP    last study- in the home possibility- 2015, pt. unsure    Prediabetes    Hgb A1C 5.8 in Jan 2017   PUD (peptic ulcer disease)    Right knee DJD 10/31/2011   Severe   S/P TAVR (transcatheter aortic valve replacement) 06/26/2015   26 mm Edwards Sapien 3 transcatheter heart valve placed via percutaneous right transfemoral approach   Severe aortic stenosis    a. Echo 11/16:  Mod LVH, EF 55-60%, no RWMA, Gr 1 DD, severe AS (mean 51 mmHg; peak 86 mmHg), MAC, mild LAE  b. s/p TAVR on 06/26/2015 with 6m Edward Sapien 3 THV   Shortness of breath dyspnea     Past Surgical History:  Procedure Laterality Date   CARDIAC CATHETERIZATION N/A 05/15/2015   Procedure: Right/Left Heart Cath and Coronary Angiography;  Surgeon: MSherren Mocha MD;  [facility];  Service: Cardiovascular;  Laterality: N/A;   CHOLECYSTECTOMY     EYE SURGERY     bilateral cataracts removed, /w IOL   KNEE SURGERY Right 1985   arthrosopic -    TEE WITHOUT CARDIOVERSION N/A 06/26/2015   Procedure: TRANSESOPHAGEAL ECHOCARDIOGRAM (TEE);  Surgeon: MSherren Mocha MD;  [place]  Service: Open Heart Surgery;  Laterality: N/A;   TONSILLECTOMY     TRANSCATHETER AORTIC VALVE REPLACEMENT, TRANSFEMORAL N/A 06/26/2015   Procedure: TRANSCATHETER AORTIC VALVE REPLACEMENT, TRANSFEMORAL;  Surgeon: MSherren Mocha MD;  [place] [facility]  Service: Open Heart Surgery;  Laterality: N/A;    Current Medications: Current Meds  Medication Sig   albuterol (PROAIR HFA) 108 (90 Base) MCG/ACT inhaler INHALE 1-2 PUFFS INTO THE LUNGS EVERY 6 (SIX) HOURS AS NEEDED FOR WHEEZING.   aspirin EC 81 MG tablet Take 1 tablet (81 mg total) by mouth daily.   furosemide (LASIX) 40 MG tablet Take 1 tablet (40 mg total) by mouth daily.   guaiFENesin (MUCINEX) 600 MG 12 hr tablet Take 600 mg by mouth 2 (two) times daily as needed for cough or to loosen phlegm.   LORazepam (ATIVAN) 1 MG tablet TAKE 1/4 TO 1/2 TABLET BY  MOUTH EVERY 6 HOURS AS NEEDED   OXYGEN Inhale into the lungs. 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     Social Determinants of Health   Financial Resource Strain: Not on file  Food Insecurity: Not on file  Transportation Needs: Not on file  Physical Activity: Not on file  Stress: Not on file  Social Connections: Not on file     Family History: The patient's family history includes Heart attack (age of onset: 24) in his mother; Prostate cancer in his brother; Prostate cancer (age of onset: 74) in his father; Stroke in his mother. There is no history of Colon cancer.  ROS:   Please see the history of present illness.    All other systems reviewed and are negative.  EKGs/Labs/Other Studies Reviewed:    The following studies were reviewed today:  04/11/2021 Echo Normal LV function RV normal Mild to moderate MR Mild MS   EKG:  The ekg ordered today demonstrates sinus rhythm.  Normal intervals.  One-to-one AV conduction.   Recent Labs: 01/16/2021: ALT 10; B Natriuretic Peptide 35.6; BUN 20; Creatinine, Ser 1.09; Hemoglobin 10.7; Platelets 213; Potassium 4.7; Sodium 139; TSH 3.299  Recent Lipid Panel    Component Value Date/Time   CHOL 176 01/16/2021 1738   TRIG 77 01/16/2021 1738   HDL 53 01/16/2021 1738   CHOLHDL 3.3 01/16/2021 1738   VLDL 15 01/16/2021 1738   LDLCALC 108 (H) 01/16/2021 1738    Physical Exam:    VS:  BP 140/80 (BP Location: Right Arm, Patient Position: Sitting, Cuff Size: Normal)   Pulse 82   Ht _0  (1.778 m)   SpO2 97% Comment: 5 Liters of Oxygen  BMI 37.31 kg/m     Wt Readings from Last 3 Encounters:  03/13/21 260 lb  (117.9 kg)  01/16/21 265 lb (120.2 kg)  01/05/20 265 lb (120.2 kg)     GEN: Chronically ill-appearing, wheelchair on nasal cannula.  In no acute distress.  Obese HEENT: Normal.  Nasal cannula NECK: No JVD; No carotid bruits LYMPHATICS: No lymphadenopathy CARDIAC: RRR, no murmurs, rubs, gallops RESPIRATORY:  Clear to auscultation without rales, wheezing or rhonchi  ABDOMEN: Soft,  non-tender, non-distended MUSCULOSKELETAL:  No edema; No deformity  SKIN: Warm and dry NEUROLOGIC:  Alert and oriented x 3 PSYCHIATRIC:  Normal affect       ASSESSMENT:    1. S/P TAVR (transcatheter aortic valve replacement)   2. Heart block   3. Chronic respiratory failure with hypoxia (HCC)    PLAN:    In order of problems listed above:  #2-1 AV block Likely related to progressive conduction disease fibrosis in setting of severe aortic stenosis post TAVR.  Today has one-to-one AV conduction.  No syncope history.  I have recommended conservative management given his multiple medical comorbidities and significantly elevated periprocedural risk.  If he develops syncope or symptomatic complete heart block, would require permanent pacing.  If pacing were required in the future, would need to perform procedure without sedation versus with anesthesia support.  This was discussed with the patient and his family today.  #Chronic respiratory failure with hypoxia Stable oxygen requirement.  Follow-up with the EP on an as-needed basis.    Medication Adjustments/Labs and Tests Ordered: Current medicines are reviewed at length with the patient today.  Concerns regarding medicines are outlined above.  Orders Placed This Encounter  Procedures   EKG 12-Lead   No orders of the defined types were placed in this encounter.    Signed, Hilton Cork. Quentin Ore, MD, [facility], [facility] 04/17/2021 3:57 PM    Electrophysiology [facility]

## 2021-04-17 ENCOUNTER — Ambulatory Visit: Payer: PPO | Admitting: Cardiology

## 2021-04-17 ENCOUNTER — Other Ambulatory Visit: Payer: Self-pay

## 2021-04-17 ENCOUNTER — Encounter: Payer: Self-pay | Admitting: Cardiology

## 2021-04-17 VITALS — BP 140/80 | HR 82 | Ht 70.0 in

## 2021-04-17 DIAGNOSIS — I459 Conduction disorder, unspecified: Secondary | ICD-10-CM

## 2021-04-17 DIAGNOSIS — Z952 Presence of prosthetic heart valve: Secondary | ICD-10-CM | POA: Diagnosis not present

## 2021-04-17 DIAGNOSIS — J9611 Chronic respiratory failure with hypoxia: Secondary | ICD-10-CM

## 2021-04-17 NOTE — Patient Instructions (Addendum)

## 2021-04-19 ENCOUNTER — Telehealth (INDEPENDENT_AMBULATORY_CARE_PROVIDER_SITE_OTHER): Payer: PPO | Admitting: Internal Medicine

## 2021-04-19 ENCOUNTER — Encounter: Payer: Self-pay | Admitting: Internal Medicine

## 2021-04-19 ENCOUNTER — Other Ambulatory Visit: Payer: Self-pay

## 2021-04-19 VITALS — BP 150/74 | HR 74 | Ht 70.0 in | Wt 258.0 lb

## 2021-04-19 DIAGNOSIS — I38 Endocarditis, valve unspecified: Secondary | ICD-10-CM | POA: Diagnosis not present

## 2021-04-19 DIAGNOSIS — I459 Conduction disorder, unspecified: Secondary | ICD-10-CM | POA: Diagnosis not present

## 2021-04-19 DIAGNOSIS — G4733 Obstructive sleep apnea (adult) (pediatric): Secondary | ICD-10-CM | POA: Diagnosis not present

## 2021-04-19 DIAGNOSIS — I10 Essential (primary) hypertension: Secondary | ICD-10-CM | POA: Diagnosis not present

## 2021-04-19 DIAGNOSIS — I5032 Chronic diastolic (congestive) heart failure: Secondary | ICD-10-CM | POA: Diagnosis not present

## 2021-04-19 MED ORDER — LOSARTAN POTASSIUM 25 MG PO TABS: 12.5000 mg | ORAL_TABLET | Freq: Every day | ORAL | 1 refills | Status: DC

## 2021-04-19 NOTE — Patient Instructions (Signed)
Medication Instructions:   Your physician has recommended you make the following change in your medication:   START Losartan 12.5 mg daily - An Rx has been sent to your pharmacy   Please monitor your blood pressure over the next 2 weeks at home and bring BP log with you to your lab appointment that will be scheduled in 2 weeks.   Please check BP approximately 1-2 hours after you take your morning blood pressure medication and write the readings down on your blood pressure log.   *If you need a refill on your cardiac medications before your next appointment, please call your pharmacy*   Lab Work:  Your physician recommends that you return to our office for lab work in: 2 [place] (BMET)   - You will be contacted by our office to schedule this lab appointment.   - You do not have to fast for this lab  Testing/Procedures:  None ordered   Follow-Up: At [facility], you and your health needs are our priority.  As part of our continuing mission to provide you with exceptional heart care, we have created designated Provider Care Teams.  These Care Teams include your primary Cardiologist (physician) and Advanced Practice Providers (APPs -  Physician Assistants and Nurse Practitioners) who all work together to provide you with the care you need, when you need it.  We recommend signing up for the patient portal called "MyChart".  Sign up information is provided on this After Visit Summary.  MyChart is used to connect with patients for Virtual Visits (Telemedicine).  Patients are able to view lab/test results, encounter notes, upcoming appointments, etc.  Non-urgent messages can be sent to your provider as well.   To learn more about what you can do with MyChart, go to [email or web address].    Your next appointment:   3 month(s)  The format for your next appointment:   In Person  Provider:   You may see Dr. Harrell Gave End or one of the following Advanced Practice Providers on your  designated Care Team:   Murray Hodgkins, NP Christell Faith, PA-C Cadence Kathlen Mody, [place]

## 2021-04-19 NOTE — Progress Notes (Signed)
Virtual Visit via Video Note   This visit type was conducted due to national recommendations for restrictions regarding the COVID-19 Pandemic (e.g. social distancing) in an effort to limit this patient's exposure and mitigate transmission in our community.  Due to his co-morbid illnesses, this patient is at least at moderate risk for complications without adequate follow up.  This format is felt to be most appropriate for this patient at this time.  All issues noted in this document were discussed and addressed.  A limited physical exam was performed with this format.  Please refer to the patient's chart for his consent to telehealth for [facility].  Video Connection Lost Video connection was lost at < 50% of the duration of this visit, at which time the remainder of the visit was completed via audio only.     Date:  04/19/2021   ID:  [name] [name], DOB 09/11/1933, MRN [id number] The patient was identified using 2 identifiers.  Patient Location: Home Provider Location: Office/Clinic   PCP:  Tonia Ghent, MD   [facility] Providers Cardiologist:  [name], MD Electrophysiologist:  Vickie Epley, MD     Evaluation Performed:  Follow-Up Visit  Chief Complaint:  Follow-up valvular heart disease and HFpEF  History of Present Illness:    [name] [name] is a 87 y.o. male with history of severe aortic stenosis status post TAVR (06/2015), chronic HFpEF, chronic respiratory failure with hypoxia, COPD, AAA, obstructive sleep apnea, and morbid obesity.  He presents today for follow-up of his valvular heart disease as well as bradycardia.  I met him in August after he transitioned care from Dr. Burt Knack in [place] to our [place] clinic.  He reported stable exertional dyspnea on 5 L of supplemental oxygen.  Chronic lightheadedness had been unchanged for years.  He was noted to be in sinus rhythm with 2:1 AV block.  He was seen by Dr. Quentin Ore (EP) earlier this week, who  recommended against pacemaker placement given paucity of symptoms and other comorbidities.  Echocardiogram last week showed normal LVEF and appropriate TAVR valve function.  Mild mitral stenosis and mild-moderate mitral regurgitation were also noted.  Today, Mr. Mahabir reports that he feels tired as he did not sleep well last night.  He otherwise has been feeling fairly well with stable dyspnea requiring his baseline 5L of oxygen.  He denies chest pain, palpitations, lightheadedness, and edema.   Past Medical History:  Diagnosis Date   AAA (abdominal aortic aneurysm)    a. 3.8 cm CTA 05/2015 b. 3.9 cm by [place] in 11/2017   Anxiety    COPD (chronic obstructive pulmonary disease) ([place])    on home O2 4LPM   Diverticulosis    Former smoker    quit 2001   Hemorrhoids    Kidney stones    passed spontaneously   OSA on CPAP    last study- in the home possibility- 2015, pt. unsure    Prediabetes    Hgb A1C 5.8 in Jan 2017   PUD (peptic ulcer disease)    Right knee DJD 10/31/2011   Severe   S/P TAVR (transcatheter aortic valve replacement) 06/26/2015   26 mm Edwards Sapien 3 transcatheter heart valve placed via percutaneous right transfemoral approach   Severe aortic stenosis    a. Echo 11/16:  Mod LVH, EF 55-60%, no RWMA, Gr 1 DD, severe AS (mean 51 mmHg; peak 86 mmHg), MAC, mild LAE  b. s/p TAVR on 06/26/2015 with 59m EPercell Miller  Sapien 3 THV   Shortness of breath dyspnea    Past Surgical History:  Procedure Laterality Date   CARDIAC CATHETERIZATION N/A 05/15/2015   Procedure: Right/Left Heart Cath and Coronary Angiography;  Surgeon: [name], MD;  Location: [place] [facility];  Service: Cardiovascular;  Laterality: N/A;   CHOLECYSTECTOMY     EYE SURGERY     bilateral cataracts removed, /w IOL   KNEE SURGERY Right 1985   arthrosopic -    TEE WITHOUT CARDIOVERSION N/A 06/26/2015   Procedure: TRANSESOPHAGEAL ECHOCARDIOGRAM (TEE);  Surgeon: [name], MD;  Location: [place];  Service: Open  Heart Surgery;  Laterality: N/A;   TONSILLECTOMY     TRANSCATHETER AORTIC VALVE REPLACEMENT, TRANSFEMORAL N/A 06/26/2015   Procedure: TRANSCATHETER AORTIC VALVE REPLACEMENT, TRANSFEMORAL;  Surgeon: [name], MD;  Location: [place];  Service: Open Heart Surgery;  Laterality: N/A;     Current Meds  Medication Sig   albuterol (PROAIR HFA) 108 (90 Base) MCG/ACT inhaler INHALE 1-2 PUFFS INTO THE LUNGS EVERY 6 (SIX) HOURS AS NEEDED FOR WHEEZING.   aspirin EC 81 MG tablet Take 1 tablet (81 mg total) by mouth daily.   furosemide (LASIX) 40 MG tablet Take 1 tablet (40 mg total) by mouth daily.   guaiFENesin (MUCINEX) 600 MG 12 hr tablet Take 600 mg by mouth 2 (two) times daily as needed for cough or to loosen phlegm.   LORazepam (ATIVAN) 1 MG tablet TAKE 1/4 TO 1/2 TABLET BY MOUTH EVERY 6 HOURS AS NEEDED   losartan (COZAAR) 25 MG tablet Take 0.5 tablets (12.5 mg total) by mouth daily.   OXYGEN Inhale into the lungs. 5 Liters   Respiratory Therapy Supplies (FLUTTER) DEVI Use as directed   SYMBICORT 160-4.5 MCG/ACT inhaler TAKE 2 PUFFS BY MOUTH TWICE A DAY     Allergies:   Iohexol, Atorvastatin, Celecoxib, Clinoril [sulindac], Nitrofurantoin, Spiriva handihaler [tiotropium bromide monohydrate], and Sulfa antibiotics   Social History   Tobacco Use   Smoking status: Former    Packs/day: 1.50    Years: 53.00    Pack years: 79.50    Types: Cigarettes    Quit date: 06/23/1999    Years since quitting: 21.8   Smokeless tobacco: Former    Types: Snuff, Chew  Vaping Use   Vaping Use: Never used  Substance Use Topics   Alcohol use: No   Drug use: No     Family Hx: The patient's family history includes Heart attack (age of onset: 24) in his mother; Prostate cancer in his brother; Prostate cancer (age of onset: 43) in his father; Stroke in his mother. There is no history of Colon cancer.  ROS:   Please see the history of present illness.   All other systems reviewed and are negative.   Prior  CV studies:   The following studies were reviewed today:  TTE (04/11/2021):  1. Left ventricular ejection fraction, by estimation, is 60 to 65%. The  left ventricle has normal function. The left ventricle has no regional  wall motion abnormalities. Left ventricular diastolic parameters are  consistent with Grade I diastolic  dysfunction (impaired relaxation).   2. Right ventricular systolic function is normal. The right ventricular  size is normal.   3. The mitral valve was not well visualized. Mild to moderate mitral  valve regurgitation. Borderline to mild mitral stenosis.   4. The aortic valve was not well visualized. Aortic valve regurgitation  is not visualized. Mild aortic valve stenosis. There is a 26  mm Edwards  Sapien prosthetic (TAVR) valve present in the aortic position. Procedure  Date: 06/26/2015. Aortic valve area,   by VTI measures 1.56 cm. Aortic valve mean gradient measures 14.2 mmHg.  Aortic valve Vmax measures 2.64 m/s.   5. The inferior vena cava is normal in size with greater than 50%  respiratory variability, suggesting right atrial pressure of 3 mmHg.   6. Challenging images   Labs/Other Tests and Data Reviewed:    EKG:  No ECG reviewed.  Recent Labs: 01/16/2021: ALT 10; B Natriuretic Peptide 35.6; BUN 20; Creatinine, Ser 1.09; Hemoglobin 10.7; Platelets 213; Potassium 4.7; Sodium 139; TSH 3.299   Recent Lipid Panel Lab Results  Component Value Date/Time   CHOL 176 01/16/2021 05:38 PM   TRIG 77 01/16/2021 05:38 PM   HDL 53 01/16/2021 05:38 PM   CHOLHDL 3.3 01/16/2021 05:38 PM   LDLCALC 108 (H) 01/16/2021 05:38 PM    Wt Readings from Last 3 Encounters:  04/19/21 258 lb (117 kg)  03/13/21 260 lb (117.9 kg)  01/16/21 265 lb (120.2 kg)         Objective:    Vital Signs:  BP (!) 150/74   Pulse 74   Ht _0  (1.778 m)   Wt 258 lb (117 kg)   SpO2 98% Comment: on 5L O2  BMI 37.02 kg/m    VITAL SIGNS:  reviewed GEN:  no acute  distress  ASSESSMENT & PLAN:    Chronic HFpEF: Stable NYHA class III symptoms, driven largely by underlying lung disease.  Recent echo showed preserved LVEF with appropriate TAVR function and mild-moderate MR and mild MS.  Given elevated BP, we will add losartan 12.5 mg daily.  Continue low-dose furosemide.  2:1 AV block: Mr. Butner was seen by Dr. Quentin Ore earlier this week; PPM not recommended given absence of symptoms and underlying comorbidities.  He was noted to be in 1:1 conduction at that time.  Avoid AV-nodal blocking agents.  Will refer back to EP if symptoms of high-grade AV block develop.  Valvular heart disease: We reviewed results of recent echo, showing normal LVEF with appropriate TAVR function as was well as mild-moderate MR and mild MS.  Unable to rate control further with BB/CCB given intermittent 2:1 AV block.  Will add losartan 12.5 mg daily for BP control.  No further intervention recommended.  Hypertension: BP remains mildly elevated today.  We will add losartan 12.5 mg daily with plans for BMP in ~2 weeks.  Mr. Sarli will monitor his BP at home during this time and bring his readings with him to the lab draw.  Time:   Today, I have spent 12 minutes with the patient with telehealth technology discussing the above problems.     Medication Adjustments/Labs and Tests Ordered: Current medicines are reviewed at length with the patient today.  Concerns regarding medicines are outlined above.   Tests Ordered: Orders Placed This Encounter  Procedures   Basic Metabolic Panel (BMET)     Medication Changes: Meds ordered this encounter  Medications   losartan (COZAAR) 25 MG tablet    Sig: Take 0.5 tablets (12.5 mg total) by mouth daily.    Dispense:  45 tablet    Refill:  1     Follow Up:  In Person in 3 month(s)  Signed, Nelva Bush, MD  04/19/2021 9:04 AM    [place]

## 2021-05-07 ENCOUNTER — Telehealth: Payer: Self-pay | Admitting: Internal Medicine

## 2021-05-07 NOTE — Telephone Encounter (Signed)
Patient is feeling sick & will call back to schedule appt

## 2021-05-07 NOTE — Telephone Encounter (Signed)
-----   Message from Solmon Ice, RN sent at 05/06/2021  3:43 PM EST ----- Regarding: 3 mo f/u Pt seen by Dr. Saunders Revel for mychart visit 04/19/21.  Please schedule pt's 3 mo f/u with either CE or APP.   Thanks!

## 2021-05-20 ENCOUNTER — Other Ambulatory Visit (INDEPENDENT_AMBULATORY_CARE_PROVIDER_SITE_OTHER): Payer: PPO

## 2021-05-20 ENCOUNTER — Other Ambulatory Visit: Payer: Self-pay

## 2021-05-20 ENCOUNTER — Other Ambulatory Visit: Payer: PPO

## 2021-05-20 DIAGNOSIS — I10 Essential (primary) hypertension: Secondary | ICD-10-CM

## 2021-05-20 DIAGNOSIS — I5032 Chronic diastolic (congestive) heart failure: Secondary | ICD-10-CM

## 2021-05-21 LAB — BASIC METABOLIC PANEL
BUN/Creatinine Ratio: 16 (ref 10–24)
BUN: 16 mg/dL (ref 8–27)
CO2: 40 mmol/L — ABNORMAL HIGH (ref 20–29)
Calcium: 8.9 mg/dL (ref 8.6–10.2)
Chloride: 97 mmol/L (ref 96–106)
Creatinine, Ser: 1 mg/dL (ref 0.76–1.27)
Glucose: 138 mg/dL — ABNORMAL HIGH (ref 70–99)
Potassium: 4.3 mmol/L (ref 3.5–5.2)
Sodium: 146 mmol/L — ABNORMAL HIGH (ref 134–144)
eGFR: 74 mL/min/{1.73_m2} (ref >59)

## 2021-05-23 ENCOUNTER — Telehealth: Payer: Self-pay | Admitting: Family Medicine

## 2021-05-23 DIAGNOSIS — L89312 Pressure ulcer of right buttock, stage 2: Secondary | ICD-10-CM | POA: Diagnosis not present

## 2021-05-23 DIAGNOSIS — L89322 Pressure ulcer of left buttock, stage 2: Secondary | ICD-10-CM | POA: Diagnosis not present

## 2021-05-23 NOTE — Telephone Encounter (Signed)
[name] [name] a case Freight forwarder from [employer] called in stated pt need's orders for a bed  .  Please Advise # [phone]

## 2021-05-24 NOTE — Telephone Encounter (Signed)
Patient will need an in person or Video visit done in order for this to be done. Dr. Damita Dunnings has not seen patient since 5/21. Called and left message for patient to call back. Visit can not be done by telephone.

## 2021-05-27 ENCOUNTER — Telehealth: Payer: Self-pay | Admitting: Family Medicine

## 2021-05-27 NOTE — Telephone Encounter (Signed)
Patient scheduled appt for 05/30/21

## 2021-05-27 NOTE — Chronic Care Management (AMB) (Signed)
  Chronic Care Management   Note  05/27/2021 Name: [name] [name] MRN: [id number] DOB: 08-30-1933  [name] [name] is a 87 y.o. year old male who is a primary care patient of Tonia Ghent, MD. I reached out to Sue Lush by phone today in response to a referral sent by [name]'s PCP, Tonia Ghent, MD.   [name] was given information about Chronic Care Management services today including:  CCM service includes personalized support from designated clinical staff supervised by his physician, including individualized plan of care and coordination with other care providers 24/7 contact phone numbers for assistance for urgent and routine care needs. Service will only be billed when office clinical staff spend 20 minutes or more in a month to coordinate care. Only one practitioner may furnish and bill the service in a calendar month. The patient may stop CCM services at any time (effective at the end of the month) by phone call to the office staff.   Patient agreed to services and verbal consent obtained.   Follow up plan:   Tatjana Secretary/administrator

## 2021-05-30 ENCOUNTER — Ambulatory Visit: Payer: PPO | Admitting: Family Medicine

## 2021-05-31 ENCOUNTER — Ambulatory Visit: Payer: PPO | Admitting: Family

## 2021-06-03 ENCOUNTER — Ambulatory Visit: Payer: PPO | Admitting: Family

## 2021-06-04 DIAGNOSIS — L89322 Pressure ulcer of left buttock, stage 2: Secondary | ICD-10-CM | POA: Diagnosis not present

## 2021-06-04 DIAGNOSIS — L89312 Pressure ulcer of right buttock, stage 2: Secondary | ICD-10-CM | POA: Diagnosis not present

## 2021-06-13 ENCOUNTER — Ambulatory Visit (INDEPENDENT_AMBULATORY_CARE_PROVIDER_SITE_OTHER): Payer: PPO | Admitting: Family Medicine

## 2021-06-13 ENCOUNTER — Encounter: Payer: Self-pay | Admitting: Family Medicine

## 2021-06-13 ENCOUNTER — Other Ambulatory Visit: Payer: Self-pay

## 2021-06-13 DIAGNOSIS — L8990 Pressure ulcer of unspecified site, unspecified stage: Secondary | ICD-10-CM

## 2021-06-13 DIAGNOSIS — I83009 Varicose veins of unspecified lower extremity with ulcer of unspecified site: Secondary | ICD-10-CM | POA: Diagnosis not present

## 2021-06-13 DIAGNOSIS — R21 Rash and other nonspecific skin eruption: Secondary | ICD-10-CM | POA: Diagnosis not present

## 2021-06-13 DIAGNOSIS — L97909 Non-pressure chronic ulcer of unspecified part of unspecified lower leg with unspecified severity: Secondary | ICD-10-CM

## 2021-06-13 DIAGNOSIS — M7020 Olecranon bursitis, unspecified elbow: Secondary | ICD-10-CM | POA: Diagnosis not present

## 2021-06-13 DIAGNOSIS — J9611 Chronic respiratory failure with hypoxia: Secondary | ICD-10-CM

## 2021-06-13 MED ORDER — NYSTATIN 100000 UNIT/GM EX POWD: 1.0000 "application " | Freq: Three times a day (TID) | CUTANEOUS | 1 refills | Status: DC

## 2021-06-13 NOTE — Patient Instructions (Addendum)
Cushion your right elbow and try using ice for 5 minutes at a time.   Try nystatin powder on the rash.  Warm compresses on the R armpit lesion.  See if it will drain on its own.  Keep using the wraps on the shins.   Keep using zinc oxide cream on your buttock.   Take care.  Glad to see you.

## 2021-06-13 NOTE — Progress Notes (Signed)
This visit occurred during the SARS-CoV-2 public health emergency.  Safety protocols were in place, including screening questions prior to the visit, additional usage of staff PPE, and extensive cleaning of exam room while observing appropriate contact time as indicated for disinfecting solutions.  Discussed getting a bed set up at home.  Would defer at this point- he wouldn't need it now.    He got a bedsore when he was in the bed with covid.  Using zinc oxide barrier cream.  Discussed using barrier cream and getting pressure off the area.  Flu shot today.    He had change in taste and presumed covid but didn't have test done.  Still with some sputum but that is getting better.  Still on 5 L at baseline.  No fevers.    Rash on back, going on for months.  Itchy.  See exam.  R elbow sx. swelling at the olecranon.  Tender if he bumps the area but not otherwise.  Normal range of motion.  Going on for about 4 months.    Wrapping BLE helped with brawny changes and edema.  He couldn't tolerate compression stockings.    Meds, vitals, and allergies reviewed.   ROS: Per HPI unless specifically indicated in ROS section   [place]ad [place] On O2 at baseline.  Neck supple, no LA R axilla with seb cyst.  Doesn't appear infected.  R gluteal sore headed.  ~1cm L gluteal sore, superficial and appears to be healing. D/w pt about barrier cream use.   CTAB RRR R olecranon bursa with local swelling but no redness, not inflamed.  Normal right elbow range of motion. Brawny BLE skin changes.   Diffuse rash on the back, punctate macular papular lesions, some are linear from scratching.  Fungal changes under the B breasts.    35 minutes were devoted to patient care in this encounter (this includes time spent reviewing the patient's file/history, interviewing and examining the patient, counseling/reviewing plan with patient).

## 2021-06-17 DIAGNOSIS — R21 Rash and other nonspecific skin eruption: Secondary | ICD-10-CM | POA: Insufficient documentation

## 2021-06-17 DIAGNOSIS — L8990 Pressure ulcer of unspecified site, unspecified stage: Secondary | ICD-10-CM | POA: Insufficient documentation

## 2021-06-17 DIAGNOSIS — M7020 Olecranon bursitis, unspecified elbow: Secondary | ICD-10-CM | POA: Insufficient documentation

## 2021-06-17 NOTE — Assessment & Plan Note (Signed)
Presumed that he had COVID because he had taste changes but he did not have testing done.  Continue 5 L O2 at baseline.

## 2021-06-17 NOTE — Assessment & Plan Note (Signed)
Continue with wrapping lower extremities as needed.  That is helping.  He could not tolerate compression stockings.

## 2021-06-17 NOTE — Assessment & Plan Note (Signed)
Olecranon bursitis.  D/w pt about cushion and icing.  Doesn't appear infected.  Would not drain at this point, given the risk of fluid room accumulation and infection with that procedure.  Discussed.  He agrees.

## 2021-06-17 NOTE — Assessment & Plan Note (Signed)
Would use nystatin powder and he can update me as needed.

## 2021-06-17 NOTE — Assessment & Plan Note (Signed)
It appears that he has symmetric lesions, one has already healed.  The residual left gluteal irritation is superficial and approximately 1 cm across.  Discussed using barrier cream and trying to get pressure off the area.

## 2021-07-01 ENCOUNTER — Telehealth: Payer: Self-pay

## 2021-07-01 ENCOUNTER — Other Ambulatory Visit: Payer: Self-pay | Admitting: Family Medicine

## 2021-07-01 NOTE — Chronic Care Management (AMB) (Signed)
Chronic Care Management Pharmacy Assistant   Name: [name] [name]  MRN: [id number] DOB: 05-29-1934  [name] [name] is an 88 y.o. year old male who presents for his initial CCM visit with the clinical pharmacist.  Reason for Encounter: Initial Questions   Conditions to be addressed/monitored: HTN and COPD   Recent office visits:  06/13/21-PCP-Graham Duncan,MD-Patient presented for follow up bedsore. Discussed using barrier cream.Flu shot given.Unable to tolerate compression stockings. Discussed cushion and icing for olecranon bursitis.continue O2  03/13/21-Internal Medicine-Richard Letvak,MD-Patient presented for cellulitis right leg.Dressing changes,start cephalexin 500mg  take 3 times daily for 1 week   Recent consult visits:  04/19/21-Cardiology-Christopher End,MD-Telemedicine-Patient presented for follow up valvular heart disease. Add Losartan 12.5mg  take 1 daily.Labs in 2 weeks, bring BP log to lab. 04/17/21-Cardiology-Cameron Lambert,MD-Patient presented for Initial visit for abnormal ECG. EKG,recommend conservative treatment  04/11/21-Cardiology- no data found  01/16/21-Cardiology-Christopher End,MD-Patient presented for follow up valvular heart disease.Increase furosemide to 40mg  take 1 tablet daily.Labs ordered.ECG ordered  Hospital visits:  None in previous 6 months  Medications: Outpatient Encounter Medications as of 07/01/2021  Medication Sig Note   albuterol (PROAIR HFA) 108 (90 Base) MCG/ACT inhaler INHALE 1-2 PUFFS INTO THE LUNGS EVERY 6 (SIX) HOURS AS NEEDED FOR WHEEZING.    aspirin EC 81 MG tablet Take 1 tablet (81 mg total) by mouth daily. 05/23/2015: .   furosemide (LASIX) 40 MG tablet Take 1 tablet (40 mg total) by mouth daily.    LORazepam (ATIVAN) 1 MG tablet TAKE 1/4 TO 1/2 TABLET BY MOUTH EVERY 6 HOURS AS NEEDED    losartan (COZAAR) 25 MG tablet Take 0.5 tablets (12.5 mg total) by mouth daily.    nystatin (MYCOSTATIN/NYSTOP) powder Apply 1 application topically 3  (three) times daily.    OXYGEN Inhale into the lungs. 5 Liters    Respiratory Therapy Supplies (FLUTTER) DEVI Use as directed    SYMBICORT 160-4.5 MCG/ACT inhaler TAKE 2 PUFFS BY MOUTH TWICE A DAY    No facility-administered encounter medications on file as of 07/01/2021.     Lab Results  Component Value Date/Time   HGBA1C 5.3 03/26/2018 11:26 AM   HGBA1C 5.5 11/14/2016 10:48 AM   MICROALBUR 4.7 (H) 10/01/2010 08:50 AM   MICROALBUR 6.4 (H) 01/10/2009 09:36 AM     BP Readings from Last 3 Encounters:  06/13/21 (!) 160/70  04/19/21 (!) 150/74  04/17/21 140/80    Patient contacted to review initial questions prior to visit with [name] [name].  Have you seen any other providers since your last visit with PCP? No  Any changes in your medications or health? No  Any side effects from any medications? No  Do you have an symptoms or problems not managed by your medications? No  Any concerns about your health right now? No  Has your provider asked that you check blood pressure, blood sugar, or follow special diet at home? Yes  The patient has a BP monitor and will have his daughter take readings from time to time   Do you get any type of exercise on a regular basis? No The patient reports he is on home oxygen and he has bilateral cellulitis lower extremities  Can you think of a goal you would like to reach for your health? Yes  The patient reports he would like to be able to walk again without the fluid on legs and unsteady gait.  Do you have any problems getting your medications? No  Is there anything that you would  like to discuss during the appointment? No   Spoke with patient and reminded them to have all medications, supplements and any blood glucose and blood pressure readings available for review with pharmacist, at their telephone visit on 07/08/21 at 11:00am.   Star Rating Drugs:  Medication:  Last Fill: Day Supply Losartan 25mg  04/19/21 90   Care Gaps: Annual  wellness visit in last year? No    scheduled for 07/11/21 Most Recent BP reading:160/70  83-P  06/13/21   Marjo Bicker CPP notified  Avel Sensor, [place] Assistant [phone]  Total time spent for month CPA: 40 min

## 2021-07-05 ENCOUNTER — Other Ambulatory Visit: Payer: Self-pay | Admitting: Family Medicine

## 2021-07-08 ENCOUNTER — Ambulatory Visit (INDEPENDENT_AMBULATORY_CARE_PROVIDER_SITE_OTHER): Payer: PPO | Admitting: Pharmacist

## 2021-07-08 ENCOUNTER — Other Ambulatory Visit: Payer: Self-pay

## 2021-07-08 DIAGNOSIS — F411 Generalized anxiety disorder: Secondary | ICD-10-CM

## 2021-07-08 DIAGNOSIS — I10 Essential (primary) hypertension: Secondary | ICD-10-CM

## 2021-07-08 DIAGNOSIS — I5032 Chronic diastolic (congestive) heart failure: Secondary | ICD-10-CM

## 2021-07-08 DIAGNOSIS — J449 Chronic obstructive pulmonary disease, unspecified: Secondary | ICD-10-CM

## 2021-07-08 MED ORDER — ALBUTEROL SULFATE HFA 108 (90 BASE) MCG/ACT IN AERS: INHALATION_SPRAY | RESPIRATORY_TRACT | 5 refills | Status: DC

## 2021-07-08 NOTE — Patient Instructions (Signed)
Visit Information  Phone number for Pharmacist: [phone]  Thank you for meeting with me to discuss your medications! I look forward to working with you to achieve your health care goals. Below is a summary of what we talked about during the visit:   Goals Addressed             This Visit's Progress    Track and Manage My Blood Pressure-Hypertension       Timeframe:  Long-Range Goal Priority:  Medium Start Date:       07/08/21                      Expected End Date:   07/08/22                    Follow Up Date July 2023   - check blood pressure weekly - choose a place to take my blood pressure (home, clinic or office, retail store) - write blood pressure results in a log or diary    Why is this important?   You won't feel high blood pressure, but it can still hurt your blood vessels.  High blood pressure can cause heart or kidney problems. It can also cause a stroke.  Making lifestyle changes like losing a little weight or eating less salt will help.  Checking your blood pressure at home and at different times of the day can help to control blood pressure.  If the doctor prescribes medicine remember to take it the way the doctor ordered.  Call the office if you cannot afford the medicine or if there are questions about it.     Notes:         Care Plan : [place]  Updates made by Charlton Haws, RPH since 07/08/2021 12:00 AM     Problem: Hypertension, Heart Failure, COPD, and Anxiety   Priority: High     Long-Range Goal: Disease mgmt   Start Date: 07/08/2021  Expected End Date: 07/08/2022  This Visit's Progress: On track  Priority: High  Note:   Current Barriers:  Unable to independently monitor therapeutic efficacy  Pharmacist Clinical Goal(s):  Patient will achieve adherence to monitoring guidelines and medication adherence to achieve therapeutic efficacy through collaboration with PharmD and provider.   Interventions: 1:1 collaboration  with Tonia Ghent, MD regarding development and update of comprehensive plan of care as evidenced by provider attestation and co-signature Inter-disciplinary care team collaboration (see longitudinal plan of care) Comprehensive medication review performed; medication list updated in electronic medical record  Heart Failure / Hypertension (BP goal <140/90) -Controlled - pt-reported BP at home is at goal; pt endorses compliance with lasix and reports swelling is stable; he reports chronic redness on legs and hx of "weeping" from legs, currently stable -Last ejection fraction: 60-65% (Date: 04/11/21) -HF type: Diastolic; NYHA Class: III (marked limitation of activity) -Hx OSA on CPAP; s/p TAVR 2017 -Current home BP/HR readings:  1/19 118/66 1/20 128/76 1/22 113/64 -Current treatment: Losartan 25 mg - 1/2 tab daily - Appropriate, Effective, Safe, Accessible Furosemide 40 mg daily - Appropriate, Effective, Safe, Accessible -Medications previously tried: amlodipine, lisinopril, metoprolol  -Current exercise habits: limited - cannot walk without walker -Educated on BP goals and benefits of medications for prevention of heart attack, stroke and kidney damage;Daily salt intake goal < 2300 mg; -Recommended to continue current medication  COPD (Goal: control symptoms and prevent exacerbations) -Controlled - pt reports symptoms are mostly stable  after recovering from [place]; he still has occasional coughing spells/chest congestion and takes Mucinex and Robitussin for symptoms -Pt reports he needs refill of albuterol -Gold Grade: unknown -Pulmonary function testing: PFT's 2000: FEV1 1.33 (37%), ratio 63, +airtrapping, DLCO 70% -Current treatment  Symbicort 160-4.5 mcg/act 2 puffs BID Albuterol HFA prn Oxygen 5L continuous -Medications previously tried: Spiriva, nebulizer (blisters in mouth) -Patient reports consistent use of maintenance inhaler -Frequency of rescue inhaler use:  occasional -Counseled on Proper inhaler technique; Benefits of consistent maintenance inhaler use -Recommended to continue current medication; coordinate refill of albuterol HFA  Anxiety (Goal: manage symptoms) -Controlled - uses lorazepam to help with sleep, usually 1/4 tablet when needed -GAD7 - not on file -Current treatment  Lorazepam 1 mg - 1/4-1/2 tab PRN -Discussed risks of lorazepam use including oversedation; pt denies side effects -Recommended to continue current medication  Hyperlipidemia: (LDL goal < 130) -Controlled without medication -Educated on Importance of limiting foods high in cholesterol; -Counseled on diet and exercise extensively  Health Maintenance -Vaccine gaps: Flu, Covid booster, Prevnar, Shingrix -Current therapy:  Aspirin 81 mg daily Nystatin powder +Mucinex ER  +Robitussin +Vitamin C 500 mg -Patient is satisfied with current therapy and denies issues -Recommended to continue current medication  Patient Goals/Self-Care Activities Patient will:  - take medications as prescribed as evidenced by patient report and record review focus on medication adherence by pill box check blood pressure periodically, document, and provide at future appointments engage in dietary modifications by reducing salt        [name] [name] was given information about Chronic Care Management services today including:  CCM service includes personalized support from designated clinical staff supervised by his physician, including individualized plan of care and coordination with other care providers 24/7 contact phone numbers for assistance for urgent and routine care needs. Standard insurance, coinsurance, copays and deductibles apply for chronic care management only during months in which we provide at least 20 minutes of these services. Most insurances cover these services at 100%, however patients may be responsible for any copay, coinsurance and/or deductible if applicable.  This service may help you avoid the need for more expensive face-to-face services. Only one practitioner may furnish and bill the service in a calendar month. The patient may stop CCM services at any time (effective at the end of the month) by phone call to the office staff.  Patient agreed to services and verbal consent obtained.   The patient verbalized understanding of instructions, educational materials, and care plan provided today and declined offer to receive copy of patient instructions, educational materials, and care plan.  Telephone follow up appointment with pharmacy team member scheduled for: 6 months  Charlene Brooke, PharmD, [facility] Clinical Pharmacist [facility] [phone]

## 2021-07-08 NOTE — Progress Notes (Signed)
Chronic Care Management Pharmacy Note  07/08/2021 Name:  NOLAWI KANADY MRN:  [id number] DOB:  12/21/1933  Summary: -Pt endorses compliance with medications as prescribed -Pt-reported BP at home is at goal (110s/60s typically) -Pt requested refill for albuterol inhaler (PRN use - last Rx 10/2019)  Recommendations/Changes made from today's visit: -No med changes -Refilled albuterol via office refill protocol  Plan: -[place] will call patient 3 months for BP check -Pharmacist follow up televisit scheduled for 6 months    Subjective: [name] [name] is an 88 y.o. year old male who is a primary patient of Damita Dunnings, Elveria Rising, MD.  The CCM team was consulted for assistance with disease management and care coordination needs.    Engaged with patient by telephone for initial visit in response to provider referral for pharmacy case management and/or care coordination services.   Consent to Services:  The patient was given the following information about Chronic Care Management services today, agreed to services, and gave verbal consent: 1. CCM service includes personalized support from designated clinical staff supervised by the primary care provider, including individualized plan of care and coordination with other care providers 2. 24/7 contact phone numbers for assistance for urgent and routine care needs. 3. Service will only be billed when office clinical staff spend 20 minutes or more in a month to coordinate care. 4. Only one practitioner may furnish and bill the service in a calendar month. 5.The patient may stop CCM services at any time (effective at the end of the month) by phone call to the office staff. 6. The patient will be responsible for cost sharing (co-pay) of up to 20% of the service fee (after annual deductible is met). Patient agreed to services and consent obtained.  Patient Care Team: Tonia Ghent, MD as PCP - General (Family Medicine) Vickie Epley, MD as  PCP - Electrophysiology (Cardiology) End, Harrell Gave, MD as PCP - Cardiology (Cardiology) Noralee Space, MD as Consulting Physician (Pulmonary Disease) Nahser, Wonda Cheng, MD as Consulting Physician (Cardiology) Charlton Haws, [facility] as Pharmacist (Pharmacist)   Recent office visits: 06/13/21-PCP-Graham Duncan,MD-Patient presented for follow up bedsore. Discussed using barrier cream, started Nystatin powder.. Flu shot given? Unable to tolerate compression stockings. Discussed cushion and icing for olecranon bursitis.continue O2   03/13/21-Internal Medicine-Richard Letvak,MD-Patient presented for cellulitis right leg.Dressing changes,start cephalexin 568m take 3 times daily for 1 week  Recent consult visits: 04/19/21-Cardiology-Christopher End,MD-Telemedicine-Patient presented for follow up valvular heart disease. Add Losartan 12.549mtake 1 daily. Labs in 2 weeks, bring BP log to lab. 04/17/21-Cardiology-Cameron Lambert,MD-Patient presented for Initial visit for AV block. EKG,recommend conservative treatment  01/16/21-Cardiology-Christopher End,MD-Patient presented for follow up valvular heart disease. Increase furosemide to 4044make 1 tablet daily.Labs ordered.ECG ordered  Hospital visits: None in previous 6 months   Objective:  Lab Results  Component Value Date   CREATININE 1.00 05/20/2021   BUN 16 05/20/2021   GFR 71.99 10/28/2019   GFRNONAA >60 01/16/2021   GFRAA >60 12/03/2016   NA 146 (H) 05/20/2021   K 4.3 05/20/2021   CALCIUM 8.9 05/20/2021   CO2 40 (H) 05/20/2021   GLUCOSE 138 (H) 05/20/2021    Lab Results  Component Value Date/Time   HGBA1C 5.3 03/26/2018 11:26 AM   HGBA1C 5.5 11/14/2016 10:48 AM   GFR 71.99 10/28/2019 09:53 AM   GFR 72.57 03/26/2018 11:26 AM   MICROALBUR 4.7 (H) 10/01/2010 08:50 AM   MICROALBUR 6.4 (H) 01/10/2009 09:36 AM  Last diabetic Eye exam: No results found for: HMDIABEYEEXA  Last diabetic Foot exam: No results found for: HMDIABFOOTEX    Lab Results  Component Value Date   CHOL 176 01/16/2021   HDL 53 01/16/2021   LDLCALC 108 (H) 01/16/2021   TRIG 77 01/16/2021   CHOLHDL 3.3 01/16/2021    Hepatic Function Latest Ref Rng & Units 01/16/2021 10/28/2019 03/26/2018  Total Protein 6.5 - 8.1 g/dL 6.8 6.6 6.6  Albumin 3.5 - 5.0 g/dL 3.4(L) 3.7 3.8  AST 15 - 41 U/L _0 ALT 0 - 44 U/L _1 Alk Phosphatase 38 - 126 U/L 90 92 88  Total Bilirubin 0.3 - 1.2 mg/dL 1.0 0.6 0.9  Bilirubin, Direct 0.0 - 0.3 mg/dL - - -    Lab Results  Component Value Date/Time   TSH 3.299 01/16/2021 05:38 PM   TSH 2.62 04/11/2015 01:41 PM   TSH 3.90 10/01/2010 08:50 AM    CBC Latest Ref Rng & Units 01/16/2021 02/17/2018 12/03/2016  WBC 4.0 - 10.5 K/uL 7.8 7.3 8.4  Hemoglobin 13.0 - 17.0 g/dL 10.7(L) 12.3(L) 12.5(L)  Hematocrit 39.0 - 52.0 % 34.0(L) 37.4(L) 36.3(L)  Platelets 150 - 400 K/uL 213 201.0 189    Lab Results  Component Value Date/Time   VD25OH 31.06 02/17/2020 10:42 AM   VD25OH 18.50 (L) 10/28/2019 09:53 AM    Clinical ASCVD: No  The ASCVD Risk score (Arnett DK, et al., 2019) failed to calculate for the following reasons:   The 2019 ASCVD risk score is only valid for ages 32 to 79    Depression screen PHQ 2/9 06/13/2021 11/02/2019 03/26/2018  Decreased Interest 0 0 0  Down, Depressed, Hopeless 0 0 0  PHQ - 2 Score 0 0 0  Altered sleeping - 0 0  Tired, decreased energy - 0 0  Change in appetite - 0 0  Feeling bad or failure about yourself  - 0 0  Trouble concentrating - 0 0  Moving slowly or fidgety/restless - 0 0  Suicidal thoughts - 0 0  PHQ-9 Score - 0 0  Difficult doing work/chores - Not difficult at all Not difficult at all  Some recent data might be hidden      Social History   Tobacco Use  Smoking Status Former   Packs/day: 1.50   Years: 53.00   Pack years: 79.50   Types: Cigarettes   Quit date: 06/23/1999   Years since quitting: 22.0  Smokeless Tobacco Former   Types: Snuff, Chew   BP Readings  from Last 3 Encounters:  06/13/21 (!) 160/70  04/19/21 (!) 150/74  04/17/21 140/80   Pulse Readings from Last 3 Encounters:  06/13/21 83  04/19/21 74  04/17/21 82   Wt Readings from Last 3 Encounters:  06/13/21 255 lb (115.7 kg)  04/19/21 258 lb (117 kg)  03/13/21 260 lb (117.9 kg)   BMI Readings from Last 3 Encounters:  06/13/21 36.59 kg/m  04/19/21 37.02 kg/m  04/17/21 37.31 kg/m    Assessment/Interventions: Review of patient past medical history, allergies, medications, health status, including review of consultants reports, laboratory and other test data, was performed as part of comprehensive evaluation and provision of chronic care management services.   SDOH:  (Social Determinants of Health) assessments and interventions performed: Yes SDOH Interventions    Flowsheet Row Most Recent Value  SDOH Interventions   Food Insecurity Interventions Intervention Not Indicated  Financial Strain Interventions Intervention Not Indicated  SDOH Screenings   Alcohol Screen: Not on file  Depression (PHQ2-9): Low Risk    PHQ-2 Score: 0  Financial Resource Strain: Low Risk    Difficulty of Paying Living Expenses: Not hard at all  Food Insecurity: No Food Insecurity   Worried About Charity fundraiser in the Last Year: Never true   Ran Out of Food in the Last Year: Never true  Housing: Not on file  Physical Activity: Not on file  Social Connections: Not on file  Stress: Not on file  Tobacco Use: Medium Risk   Smoking Tobacco Use: Former   Smokeless Tobacco Use: Former   Passive Exposure: Not on Pensions consultant Needs: Not on file    [place]  Allergies  Allergen Reactions   Iohexol Hives and Swelling    Pt reports swelling, redness, hives, and blisters    Atorvastatin     REACTION: aches   Celecoxib     REACTION: rash   Clinoril [Sulindac]     REACTION: rash   Nitrofurantoin    Spiriva Handihaler [Tiotropium Bromide Monohydrate] Other (See  Comments)    Dry mouth   Sulfa Antibiotics     Intolerant but unrecalled.     Medications Reviewed Today     Reviewed by Charlton Haws, [facility] (Pharmacist) on 07/08/21 at 1201  Med List Status: <None>   Medication Order Taking? Sig Documenting Provider Last Dose Status Informant  albuterol (PROAIR HFA) 108 (90 Base) MCG/ACT inhaler [id number] Yes INHALE 1-2 PUFFS INTO THE LUNGS EVERY 6 (SIX) HOURS AS NEEDED FOR WHEEZING. Tonia Ghent, MD Taking Active   aspirin EC 81 MG tablet [id number] Yes Take 1 tablet (81 mg total) by mouth daily. Liliane Shi, PA-C Taking Active Self           Med Note Marylen Ponto   Wed May 23, 2015  1:57 PM) .  furosemide (LASIX) 40 MG tablet [id number] Yes Take 1 tablet (40 mg total) by mouth daily. End, Harrell Gave, MD Taking Active   LORazepam (ATIVAN) 1 MG tablet [id number] Yes TAKE 1/4 TO 1/2 TABLET BY MOUTH EVERY 6 HOURS AS NEEDED Tonia Ghent, MD Taking Active   losartan (COZAAR) 25 MG tablet [id number] Yes Take 0.5 tablets (12.5 mg total) by mouth daily. End, Harrell Gave, MD Taking Active   nystatin (MYCOSTATIN/NYSTOP) powder [id number] Yes Apply 1 application topically 3 (three) times daily. Tonia Ghent, MD Taking Active   OXYGEN [id number] Yes Inhale into the lungs. 5 Liters [provider] Taking Active Self  Respiratory Therapy Supplies (FLUTTER) DEVI [id number] Yes Use as directed Clance, Armando Reichert, MD Taking Active Self  SYMBICORT 160-4.5 MCG/ACT inhaler [id number] Yes TAKE 2 PUFFS BY MOUTH TWICE A DAY Tonia Ghent, MD Taking Active   vitamin C (ASCORBIC ACID) 500 MG tablet [id number] Yes Take 500 mg by mouth daily. [provider] Taking Active             Patient Active Problem List   Diagnosis Date Noted   Rash 06/17/2021   Pressure sore 06/17/2021   Olecranon bursitis 06/17/2021   Valvular heart disease 04/19/2021   Cellulitis of right leg 03/13/2021   Venous stasis ulcer ([place]) 03/13/2021   Heart block  01/17/2021   Blurry vision 11/07/2019   Healthcare maintenance 11/07/2019   Insomnia 04/01/2018   Myalgia 12/03/2016   Vitamin D deficiency 11/21/2016   Chronic heart failure with preserved ejection fraction (HFpEF) ([place]) 07/13/2016   AAA (  abdominal aortic aneurysm) without rupture 07/13/2016   COPD mixed type ([place]) 12/27/2015   Fecal urgency 09/14/2015   S/P TAVR (transcatheter aortic valve replacement) 06/26/2015   Severe aortic stenosis 05/15/2015   Medicare annual wellness visit, subsequent 05/21/2014   Advance care planning 05/21/2014   Skin irritation 12/20/2013   Risk for falls 12/20/2013   Rib pain on right side 01/29/2013   Vertigo 07/01/2012   Right knee DJD 10/31/2011   Knee pain 10/23/2011   OSA (obstructive sleep apnea) 07/31/2009   Chronic respiratory failure with hypoxia ([place]) 08/03/2008   Morbid obesity ([place]) 02/22/2008   POLYP, COLON 08/27/2007   Anxiety state 08/27/2007   COPD (chronic obstructive pulmonary disease) with emphysema ([place]) 08/27/2007   INTERNAL HEMORRHOIDS 03/23/2007   DIVERTICULOSIS, COLON 03/23/2007   SYMPTOM, PAIN, ABDOMINAL, GENERALIZED 02/18/2007   HYPERGLYCEMIA 02/18/2007   HYPERTENSION, BENIGN ESSENTIAL 02/10/2007   NEOPLASM, SKIN, UNCERTAIN BEHAVIOR 16/03/9603   PEPTIC ULCER DISEASE WITH H-PYLORI  TX'D 11/19/2006   NEPHROLITHIASIS 11/19/2006   Backache 11/19/2006   BPH (benign prostatic hyperplasia) 11/19/2006    Immunization History  Administered Date(s) Administered   Influenza Split 06/17/2011, 02/23/2012   Influenza Whole 04/27/2006, 04/10/2009, 04/10/2010   Influenza, Seasonal, Injecte, Preservative Fre 06/13/2016   Influenza,inj,Quad PF,6+ Mos 03/17/2013, 04/24/2014, 03/17/2015, 07/15/2016, 03/26/2018   PFIZER(Purple Top)SARS-COV-2 Vaccination 07/18/2019, 08/08/2019, 05/16/2020   Pneumococcal Polysaccharide-23 02/15/1999, 10/20/2011   Td 02/05/2006, 01/08/2017   Zoster, Live 01/15/2009    Conditions to be  addressed/monitored:  Hypertension, Heart Failure, COPD, and Anxiety  Care Plan : [place]  Updates made by Charlton Haws, [place] since 07/08/2021 12:00 AM     Problem: Hypertension, Heart Failure, COPD, and Anxiety   Priority: High     Long-Range Goal: Disease mgmt   Start Date: 07/08/2021  Expected End Date: 07/08/2022  This Visit's Progress: On track  Priority: High  Note:   Current Barriers:  Unable to independently monitor therapeutic efficacy  Pharmacist Clinical Goal(s):  Patient will achieve adherence to monitoring guidelines and medication adherence to achieve therapeutic efficacy through collaboration with PharmD and provider.   Interventions: 1:1 collaboration with Tonia Ghent, MD regarding development and update of comprehensive plan of care as evidenced by provider attestation and co-signature Inter-disciplinary care team collaboration (see longitudinal plan of care) Comprehensive medication review performed; medication list updated in electronic medical record  Heart Failure / Hypertension (BP goal <140/90) -Controlled - pt-reported BP at home is at goal; pt endorses compliance with lasix and reports swelling is stable; he reports chronic redness on legs and hx of "weeping" from legs, currently stable -Last ejection fraction: 60-65% (Date: 04/11/21) -HF type: Diastolic; NYHA Class: III (marked limitation of activity) -Hx OSA on CPAP; s/p TAVR 2017 -Current home BP/HR readings:  1/19 118/66 1/20 128/76 1/22 113/64 -Current treatment: Losartan 25 mg - 1/2 tab daily - Appropriate, Effective, Safe, Accessible Furosemide 40 mg daily - Appropriate, Effective, Safe, Accessible -Medications previously tried: amlodipine, lisinopril, metoprolol  -Current exercise habits: limited - cannot walk without walker -Educated on BP goals and benefits of medications for prevention of heart attack, stroke and kidney damage;Daily salt intake goal < 2300  mg; -Recommended to continue current medication  COPD (Goal: control symptoms and prevent exacerbations) -Controlled - pt reports symptoms are mostly stable after recovering from [place]; he still has occasional coughing spells/chest congestion and takes Mucinex and Robitussin for symptoms -Pt reports he needs refill of albuterol -Gold Grade: unknown -Pulmonary function testing: PFT's 2000:  FEV1 1.33 (37%), ratio 63, +airtrapping, DLCO 70% -Current treatment  Symbicort 160-4.5 mcg/act 2 puffs BID Albuterol HFA prn Oxygen 5L continuous -Medications previously tried: Spiriva, nebulizer (blisters in mouth) -Patient reports consistent use of maintenance inhaler -Frequency of rescue inhaler use: occasional -Counseled on Proper inhaler technique; Benefits of consistent maintenance inhaler use -Recommended to continue current medication; coordinate refill of albuterol HFA  Anxiety (Goal: manage symptoms) -Controlled - uses lorazepam to help with sleep, usually 1/4 tablet when needed -GAD7 - not on file -Current treatment  Lorazepam 1 mg - 1/4-1/2 tab PRN -Discussed risks of lorazepam use including oversedation; pt denies side effects -Recommended to continue current medication  Hyperlipidemia: (LDL goal < 130) -Controlled without medication -Educated on Importance of limiting foods high in cholesterol; -Counseled on diet and exercise extensively  Health Maintenance -Vaccine gaps: Flu, Covid booster, Prevnar, Shingrix -Current therapy:  Aspirin 81 mg daily Nystatin powder +Mucinex ER  +Robitussin +Vitamin C 500 mg -Patient is satisfied with current therapy and denies issues -Recommended to continue current medication  Patient Goals/Self-Care Activities Patient will:  - take medications as prescribed as evidenced by patient report and record review focus on medication adherence by pill box check blood pressure periodically, document, and provide at future appointments engage in  dietary modifications by reducing salt        Medication Assistance: None required.  Patient affirms current coverage meets needs.  Compliance/Adherence/Medication fill history: Care Gaps: None  Star-Rating Drugs: Losartan - LF 04/19/21 x 90 ds; [place] 100%  Patient's preferred pharmacy is:  [id number]Phone: [phone]: [phone] Uses pill box? No -   Pt endorses 100% compliance  We discussed: Benefits of medication synchronization, packaging and delivery as well as enhanced pharmacist oversight with [employer]. Patient decided to: Continue current medication management strategy  Care Plan and Follow Up Patient Decision:  Patient agrees to Care Plan and Follow-up.  Plan: Telephone follow up appointment with care management team member scheduled for:  6 months  LCharlene Brooke PharmD, BCACP Clinical Pharmacist [phone]

## 2021-07-10 NOTE — Progress Notes (Deleted)
Subjective:   [name] [name] is a 88 y.o. male who presents for Medicare Annual/Subsequent preventive examination.  I connected with Tereasa Coop today by telephone and verified that I am speaking with the correct person using two identifiers. Location patient: home Location provider: work Persons participating in the virtual visit: patient, Marine scientist.    I discussed the limitations, risks, security and privacy concerns of performing an evaluation and management service by telephone and the availability of in person appointments. I also discussed with the patient that there may be a patient responsible charge related to this service. The patient expressed understanding and verbally consented to this telephonic visit.    Interactive audio and video telecommunications were attempted between this provider and patient, however failed, due to patient having technical difficulties OR patient did not have access to video capability.  We continued and completed visit with audio only.  Some vital signs may be absent or patient reported.   Time Spent with patient on telephone encounter: *** minutes  Review of Systems           Objective:    There were no vitals filed for this visit. There is no height or weight on file to calculate BMI.  Advanced Directives 11/02/2019 03/26/2018 12/03/2016 12/02/2016 11/14/2016 11/29/2015 09/14/2015  Does Patient Have a Medical Advance Directive? _0  No No  Would patient like information on creating a medical advance directive? No - Patient declined No - Patient declined No - Patient declined - - No - patient declined information Yes - Educational materials given    Current Medications (verified) Outpatient Encounter Medications as of 07/11/2021  Medication Sig   albuterol (PROAIR HFA) 108 (90 Base) MCG/ACT inhaler INHALE 1-2 PUFFS INTO THE LUNGS EVERY 6 (SIX) HOURS AS NEEDED FOR WHEEZING.   aspirin EC 81 MG tablet Take 1 tablet (81 mg total) by mouth daily.    furosemide (LASIX) 40 MG tablet Take 1 tablet (40 mg total) by mouth daily.   LORazepam (ATIVAN) 1 MG tablet TAKE 1/4 TO 1/2 TABLET BY MOUTH EVERY 6 HOURS AS NEEDED   losartan (COZAAR) 25 MG tablet Take 0.5 tablets (12.5 mg total) by mouth daily.   nystatin (MYCOSTATIN/NYSTOP) powder Apply 1 application topically 3 (three) times daily.   OXYGEN Inhale into the lungs. 5 Liters   Respiratory Therapy Supplies (FLUTTER) DEVI Use as directed   SYMBICORT 160-4.5 MCG/ACT inhaler TAKE 2 PUFFS BY MOUTH TWICE A DAY   vitamin C (ASCORBIC ACID) 500 MG tablet Take 500 mg by mouth daily.   No facility-administered encounter medications on file as of 07/11/2021.    Allergies (verified) Iohexol, Atorvastatin, Celecoxib, Clinoril [sulindac], Nitrofurantoin, Spiriva handihaler [tiotropium bromide monohydrate], and Sulfa antibiotics   History: Past Medical History:  Diagnosis Date   AAA (abdominal aortic aneurysm)    a. 3.8 cm CTA 05/2015 b. 3.9 cm by [place] in 11/2017   Anxiety    COPD (chronic obstructive pulmonary disease) ([place])    on home O2 4LPM   Diverticulosis    Former smoker    quit 2001   Hemorrhoids    Kidney stones    passed spontaneously   OSA on CPAP    last study- in the home possibility- 2015, pt. unsure    Prediabetes    Hgb A1C 5.8 in Jan 2017   PUD (peptic ulcer disease)    Right knee DJD 10/31/2011   Severe   S/P TAVR (transcatheter aortic valve replacement) 06/26/2015  26 mm Edwards Sapien 3 transcatheter heart valve placed via percutaneous right transfemoral approach   Severe aortic stenosis    a. Echo 11/16:  Mod LVH, EF 55-60%, no RWMA, Gr 1 DD, severe AS (mean 51 mmHg; peak 86 mmHg), MAC, mild LAE  b. s/p TAVR on 06/26/2015 with 66m Edward Sapien 3 THV   Shortness of breath dyspnea    Past Surgical History:  Procedure Laterality Date   CARDIAC CATHETERIZATION N/A 05/15/2015   Procedure: Right/Left Heart Cath and Coronary Angiography;  Surgeon: MSherren Mocha MD;   [facility];  Service: Cardiovascular;  Laterality: N/A;   CHOLECYSTECTOMY     EYE SURGERY     bilateral cataracts removed, /w IOL   KNEE SURGERY Right 1985   arthrosopic -    TEE WITHOUT CARDIOVERSION N/A 06/26/2015   Procedure: TRANSESOPHAGEAL ECHOCARDIOGRAM (TEE);  Surgeon: MSherren Mocha MD;  [facility]  Service: Open Heart Surgery;  Laterality: N/A;   TONSILLECTOMY     TRANSCATHETER AORTIC VALVE REPLACEMENT, TRANSFEMORAL N/A 06/26/2015   Procedure: TRANSCATHETER AORTIC VALVE REPLACEMENT, TRANSFEMORAL;  Surgeon: MSherren Mocha MD;  [facility]  Service: Open Heart Surgery;  Laterality: N/A;   Family History  Problem Relation Age of Onset   Stroke Mother    Heart attack Mother 566      deceased   Prostate cancer Father 612  Prostate cancer Brother    Colon cancer Neg Hx    Social History   Socioeconomic History   Marital status: Married    Spouse name: Not on file   Number of children: 1   Years of education: Not on file   Highest education level: Not on file  Occupational History   Occupation: Retired    Comment: [employer] 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     Social Determinants of Health   Financial Resource Strain: Low Risk    Difficulty of Paying Living Expenses: Not hard at all  Food Insecurity: No Food Insecurity   Worried About RCharity fundraiserin the Last Year: Never true   RArboriculturistin  the Last Year: Never true  Transportation Needs: Not on file  Physical Activity: Not on file  Stress: Not on file  Social Connections: Not on file    Tobacco Counseling Counseling given: Not Answered   Clinical Intake:                 Diabetic?No         Activities of Daily Living No flowsheet data found.  Patient Care Team: DTonia Ghent MD as PCP - General (Family Medicine) LVickie Epley MD as PCP - Electrophysiology (Cardiology) End, CHarrell Gave MD as PCP - Cardiology (Cardiology) NNoralee Space MD  as Consulting Physician (Pulmonary Disease) Nahser, Wonda Cheng, MD as Consulting Physician (Cardiology) Charlton Haws, [facility] as Pharmacist (Pharmacist)  Indicate any recent Medical Services you may have received from other than [facility] providers in the past year (date may be approximate).     Assessment:   This is a routine wellness examination for Jaymari.  Hearing/Vision screen No results found.  Dietary issues and exercise activities discussed:     Goals Addressed   None    Depression Screen PHQ 2/9 Scores 06/13/2021 11/02/2019 03/26/2018 11/14/2016 12/27/2015 09/14/2015 09/14/2015  PHQ - 2 Score 0 0 0 2 0 0 0  PHQ- 9 Score - 0 0 7 - - -    Fall Risk Fall Risk  06/13/2021 11/02/2019 05/11/2019 03/26/2018 11/14/2016  Falls in the past year? 0 1 1 No Yes  Comment - - Emmi Telephone Survey: data to providers prior to load - pt stated he tripped. no injury or medical treatment  Number falls in past yr: 0 1 1 - 1  Comment - - Emmi Telephone Survey Actual Response = 1 - -  Injury with Fall? 0 0 1 - No  Risk for fall due to : History of fall(s);Impaired mobility Medication side effect;Impaired balance/gait;History of fall(s) - - -  Follow up Falls evaluation completed Falls evaluation completed;Falls prevention discussed - - -    FALL RISK PREVENTION PERTAINING TO THE HOME:  Any stairs in or around the home? {YES/NO:21197} If so, are there any  without handrails? {YES/NO:21197} Home free of loose throw rugs in walkways, pet beds, electrical cords, etc? {YES/NO:21197} Adequate lighting in your home to reduce risk of falls? {YES/NO:21197}  ASSISTIVE DEVICES UTILIZED TO PREVENT FALLS:  Life alert? {YES/NO:21197} Use of a cane, walker or w/c? {YES/NO:21197} Grab bars in the bathroom? {YES/NO:21197} Shower chair or bench in shower? {YES/NO:21197} Elevated toilet seat or a handicapped toilet? {YES/NO:21197}  TIMED UP AND GO:  Was the test performed? No .    Cognitive Function: MMSE - Mini Mental State Exam 11/02/2019 03/26/2018 11/14/2016 09/14/2015  Orientation to time _0 Orientation to Place _1 Registration _2 Attention/ Calculation 5 0 0 0  Recall _3 Recall-comments - - pt was unable to recall 1 of 3 words -  Language- name 2 objects - 0 0 0  Language- repeat _4 Language- follow 3 step command - _5 Language- read & follow direction - 0 0 0  Write a sentence - 0 0 0  Copy design - 0 0 0  Total score - _6 Immunizations Immunization History  Administered Date(s) Administered   Influenza Split 06/17/2011, 02/23/2012   Influenza Whole 04/27/2006, 04/10/2009, 04/10/2010   Influenza, Seasonal, Injecte, Preservative Fre 06/13/2016   Influenza,inj,Quad PF,6+ Mos 03/17/2013, 04/24/2014, 03/17/2015, 07/15/2016, 03/26/2018   PFIZER(Purple Top)SARS-COV-2 Vaccination 07/18/2019, 08/08/2019, 05/16/2020   Pneumococcal Polysaccharide-23 02/15/1999, 10/20/2011   Td 02/05/2006, 01/08/2017   Zoster, Live 01/15/2009    TDAP status: Up to date  {Flu Vaccine status:2101806}  Pneumococcal vaccine status: Up to date  {Covid-19 vaccine status:2101808}  Qualifies for Shingles Vaccine? Yes   Zostavax completed Yes   {Shingrix Completed?:2101804}  Screening Tests Health Maintenance  Topic Date Due   Zoster Vaccines- Shingrix (1 of 2) Never done   Pneumonia Vaccine 75+ Years old (3  - PCV) 10/19/2012   COVID-19  Vaccine (4 - Booster for Pfizer series) 07/11/2020   INFLUENZA VACCINE  01/14/2021   COLONOSCOPY (Pts 45-16yr Insurance coverage will need to be confirmed)  11/14/2025 (Originally 03/22/2012)   TETANUS/TDAP  01/09/2027   HPV VACCINES  Aged Out    Health Maintenance  Health Maintenance Due  Topic Date Due   Zoster Vaccines- Shingrix (1 of 2) Never done   Pneumonia Vaccine 86 Years old (3 - PCV) 10/19/2012   COVID-19 Vaccine (4 - Booster for Pfizer series) 07/11/2020   INFLUENZA VACCINE  01/14/2021    Colorectal cancer screening: No longer required.   Lung Cancer Screening: (Low Dose CT Chest recommended if Age 272-80years, 30 pack-year currently smoking OR have quit w/in 15years.) does qualify.   Lung Cancer Screening Referral: ***  Additional Screening:  Hepatitis C Screening: does qualify  Vision Screening: Recommended annual ophthalmology exams for early detection of glaucoma and other disorders of the eye. Is the patient up to date with their annual eye exam?  {YES/NO:21197} Who is the provider or what is the name of the office in which the patient attends annual eye exams? *** If pt is not established with a provider, would they like to be referred to a provider to establish care? {YES/NO:21197}.   Dental Screening: Recommended annual dental exams for proper oral hygiene  Community Resource Referral / Chronic Care Management: CRR required this visit?  {YES/NO:21197}  CCM required this visit?  {YES/NO:21197}     Plan:     I have personally reviewed and noted the following in the patients chart:   Medical and social history Use of alcohol, tobacco or illicit drugs  Current medications and supplements including opioid prescriptions. {Opioid Prescriptions:[phone]} Functional ability and status Nutritional status Physical activity Advanced directives List of other physicians Hospitalizations, surgeries, and ER visits in previous  12 months Vitals Screenings to include cognitive, depression, and falls Referrals and appointments  In addition, I have reviewed and discussed with patient certain preventive protocols, quality metrics, and best practice recommendations. A written personalized care plan for preventive services as well as general preventive health recommendations were provided to patient.   Due to this being a telephonic visit, the after visit summary with patients personalized plan was offered to patient via mail or my-chart. ***Patient declined at this time./ Patient would like to access on my-chart/ per request, patient was mailed a copy of AVS./ Patient preferred to pick up at office at next visit.   TLoma Messing LPN   17/53/0051  Nurse Health Advisor  Nurse Notes: none

## 2021-07-11 ENCOUNTER — Telehealth: Payer: Self-pay

## 2021-07-11 ENCOUNTER — Ambulatory Visit: Payer: PPO

## 2021-07-11 NOTE — Telephone Encounter (Signed)
Made several attempts to contact patient in regards to annual wellness visit for Medicare today @ 2:00pm, left vm messaging advising of appointment and to contact office to reschedule when available TM

## 2021-07-16 DIAGNOSIS — I110 Hypertensive heart disease with heart failure: Secondary | ICD-10-CM

## 2021-07-16 DIAGNOSIS — J449 Chronic obstructive pulmonary disease, unspecified: Secondary | ICD-10-CM | POA: Diagnosis not present

## 2021-07-16 DIAGNOSIS — F17290 Nicotine dependence, other tobacco product, uncomplicated: Secondary | ICD-10-CM

## 2021-07-16 DIAGNOSIS — I5032 Chronic diastolic (congestive) heart failure: Secondary | ICD-10-CM | POA: Diagnosis not present

## 2021-07-26 DIAGNOSIS — I5030 Unspecified diastolic (congestive) heart failure: Secondary | ICD-10-CM | POA: Diagnosis not present

## 2021-07-26 DIAGNOSIS — D692 Other nonthrombocytopenic purpura: Secondary | ICD-10-CM | POA: Diagnosis not present

## 2021-07-26 DIAGNOSIS — Z7982 Long term (current) use of aspirin: Secondary | ICD-10-CM | POA: Diagnosis not present

## 2021-07-26 DIAGNOSIS — I110 Hypertensive heart disease with heart failure: Secondary | ICD-10-CM | POA: Diagnosis not present

## 2021-07-26 DIAGNOSIS — Z6835 Body mass index (BMI) 35.0-35.9, adult: Secondary | ICD-10-CM | POA: Diagnosis not present

## 2021-07-26 DIAGNOSIS — I878 Other specified disorders of veins: Secondary | ICD-10-CM | POA: Diagnosis not present

## 2021-07-26 DIAGNOSIS — I739 Peripheral vascular disease, unspecified: Secondary | ICD-10-CM | POA: Diagnosis not present

## 2021-07-26 DIAGNOSIS — L89152 Pressure ulcer of sacral region, stage 2: Secondary | ICD-10-CM | POA: Diagnosis not present

## 2021-07-26 DIAGNOSIS — J449 Chronic obstructive pulmonary disease, unspecified: Secondary | ICD-10-CM | POA: Diagnosis not present

## 2021-07-26 DIAGNOSIS — Z515 Encounter for palliative care: Secondary | ICD-10-CM | POA: Diagnosis not present

## 2021-07-26 DIAGNOSIS — Z7951 Long term (current) use of inhaled steroids: Secondary | ICD-10-CM | POA: Diagnosis not present

## 2021-08-08 ENCOUNTER — Ambulatory Visit: Payer: PPO | Admitting: Internal Medicine

## 2021-08-08 DIAGNOSIS — G4733 Obstructive sleep apnea (adult) (pediatric): Secondary | ICD-10-CM | POA: Diagnosis not present

## 2021-08-08 NOTE — Progress Notes (Unsigned)
Follow-up Outpatient Visit Date: 08/08/2021  Primary Care Provider: Tonia Ghent, MD [address] [place] [address]  Chief Complaint: ***  HPI:  [name] [name] is a 88 y.o. male with history of severe aortic stenosis status post TAVR (06/2015), chronic HFpEF, asymptomatic 2:1 AV block (no intervention recommended by Dr. Quentin Ore), chronic respiratory failure with hypoxia, COPD, AAA, obstructive sleep apnea, and morbid obesity, who presents for follow-up of HFpEF and valvular heart disease as well as bradycardia.  We last spoke via virtual visit in early November, at which time he reported stable exertional dyspnea on his baseline of 5 L supplemental oxygen.  He denied chest pain, palpitations, lightheadedness, and edema.  He reported elevated home blood pressures, prompting [place] to add losartan 12.5 mg twice daily.  --------------------------------------------------------------------------------------------------  Past Medical History:  Diagnosis Date   AAA (abdominal aortic aneurysm)    a. 3.8 cm CTA 05/2015 b. 3.9 cm by [place] in 11/2017   Anxiety    COPD (chronic obstructive pulmonary disease) ([place])    on home O2 4LPM   Diverticulosis    Former smoker    quit 2001   Hemorrhoids    Kidney stones    passed spontaneously   OSA on CPAP    last study- in the home possibility- 2015, pt. unsure    Prediabetes    Hgb A1C 5.8 in Jan 2017   PUD (peptic ulcer disease)    Right knee DJD 10/31/2011   Severe   S/P TAVR (transcatheter aortic valve replacement) 06/26/2015   26 mm Edwards Sapien 3 transcatheter heart valve placed via percutaneous right transfemoral approach   Severe aortic stenosis    a. Echo 11/16:  Mod LVH, EF 55-60%, no RWMA, Gr 1 DD, severe AS (mean 51 mmHg; peak 86 mmHg), MAC, mild LAE  b. s/p TAVR on 06/26/2015 with 52m Edward Sapien 3 THV   Shortness of breath dyspnea    Past Surgical History:  Procedure Laterality Date   CARDIAC CATHETERIZATION N/A 05/15/2015    Procedure: Right/Left Heart Cath and Coronary Angiography;  Surgeon: MSherren Mocha MD;  [facility];  Service: Cardiovascular;  Laterality: N/A;   CHOLECYSTECTOMY     EYE SURGERY     bilateral cataracts removed, /w IOL   KNEE SURGERY Right 1985   arthrosopic -    TEE WITHOUT CARDIOVERSION N/A 06/26/2015   Procedure: TRANSESOPHAGEAL ECHOCARDIOGRAM (TEE);  Surgeon: MSherren Mocha MD;  [place] [facility]  Service: Open Heart Surgery;  Laterality: N/A;   TONSILLECTOMY     TRANSCATHETER AORTIC VALVE REPLACEMENT, TRANSFEMORAL N/A 06/26/2015   Procedure: TRANSCATHETER AORTIC VALVE REPLACEMENT, TRANSFEMORAL;  Surgeon: MSherren Mocha MD;  [facility]  Service: Open Heart Surgery;  Laterality: N/A;    No outpatient medications have been marked as taking for the 08/08/21 encounter (Appointment) with Wildon Cuevas, CHarrell Gave MD.    Allergies: Iohexol, Atorvastatin, Celecoxib, Clinoril [sulindac], Nitrofurantoin, Spiriva handihaler [tiotropium bromide monohydrate], and Sulfa antibiotics  Social History   Tobacco Use   Smoking status: Former    Packs/day: 1.50    Years: 53.00    Pack years: 79.50    Types: Cigarettes    Quit date: 06/23/1999    Years since quitting: 22.1   Smokeless tobacco: Former    Types: Snuff, Chew  Vaping Use   Vaping Use: Never used  Substance Use Topics   Alcohol use: No   Drug use: No    Family History  Problem Relation Age  of Onset   Stroke Mother    Heart attack Mother 50       deceased   Prostate cancer Father 33   Prostate cancer Brother    Colon cancer Neg Hx     Review of Systems: A 12-system review of systems was performed and was negative except as noted in the HPI.  --------------------------------------------------------------------------------------------------  Physical Exam: There were no vitals taken for this visit.  General:  NAD. Neck: No JVD or HJR. Lungs: Clear to auscultation bilaterally without wheezes or  crackles. Heart: Regular rate and rhythm without murmurs, rubs, or gallops. Abdomen: Soft, nontender, nondistended. Extremities: No lower extremity edema.  EKG:  ***  Lab Results  Component Value Date   WBC 7.8 01/16/2021   HGB 10.7 (L) 01/16/2021   HCT 34.0 (L) 01/16/2021   MCV 99.4 01/16/2021   PLT 213 01/16/2021    Lab Results  Component Value Date   NA 146 (H) 05/20/2021   K 4.3 05/20/2021   CL 97 05/20/2021   CO2 40 (H) 05/20/2021   BUN 16 05/20/2021   CREATININE 1.00 05/20/2021   GLUCOSE 138 (H) 05/20/2021   ALT 10 01/16/2021    Lab Results  Component Value Date   CHOL 176 01/16/2021   HDL 53 01/16/2021   LDLCALC 108 (H) 01/16/2021   TRIG 77 01/16/2021   CHOLHDL 3.3 01/16/2021    --------------------------------------------------------------------------------------------------  ASSESSMENT AND PLAN: [name] Aadhav Uhlig, MD 08/08/2021 1:09 PM

## 2021-09-25 ENCOUNTER — Encounter: Payer: Self-pay | Admitting: Internal Medicine

## 2021-09-25 ENCOUNTER — Telehealth: Payer: Self-pay | Admitting: Family Medicine

## 2021-09-25 ENCOUNTER — Ambulatory Visit: Payer: PPO | Admitting: Internal Medicine

## 2021-09-25 VITALS — BP 128/70 | HR 96 | Ht 70.0 in | Wt 240.0 lb

## 2021-09-25 DIAGNOSIS — I10 Essential (primary) hypertension: Secondary | ICD-10-CM

## 2021-09-25 DIAGNOSIS — I459 Conduction disorder, unspecified: Secondary | ICD-10-CM | POA: Diagnosis not present

## 2021-09-25 DIAGNOSIS — I5032 Chronic diastolic (congestive) heart failure: Secondary | ICD-10-CM | POA: Diagnosis not present

## 2021-09-25 DIAGNOSIS — J449 Chronic obstructive pulmonary disease, unspecified: Secondary | ICD-10-CM

## 2021-09-25 DIAGNOSIS — I350 Nonrheumatic aortic (valve) stenosis: Secondary | ICD-10-CM

## 2021-09-25 DIAGNOSIS — J9611 Chronic respiratory failure with hypoxia: Secondary | ICD-10-CM

## 2021-09-25 DIAGNOSIS — Z952 Presence of prosthetic heart valve: Secondary | ICD-10-CM | POA: Diagnosis not present

## 2021-09-25 MED ORDER — LOSARTAN POTASSIUM 25 MG PO TABS: 12.5000 mg | ORAL_TABLET | Freq: Every day | ORAL | 1 refills | Status: DC

## 2021-09-25 NOTE — Progress Notes (Signed)
? ?Follow-up Outpatient Visit ?Date: 09/25/2021 ? ?Primary Care Provider: ?Tonia Ghent, MD ?[place] ?[place] [address] ? ?Chief Complaint: Follow-up shortness of breath ? ?HPI:  Mr. Barstow is a 88 y.o. male with history of severe aortic stenosis status post TAVR (06/2015), chronic HFpEF, 2:1 AV block, chronic respiratory failure with hypoxia, COPD, AAA, obstructive sleep apnea, and morbid obesity, who presents for follow-up of HFpEF and valvular heart disease.  We last spoke via virtual visit in 04/2021, at which time Mr. Pennella was feeling fairly well in the setting of his multiple comorbidities.  Chronic exertional dyspnea requiring 5 L of supplemental oxygen was stable.  He was previously seen by Dr. Quentin Ore for consideration of PPM placement in the setting of 2:1 AV block PAC.  However, PPM was deferred given paucity of symptoms and other comorbidities. ? ?Today, Mr. Weirauch reports that he is slowly returning to his baseline.  He had a respiratory infection in December, which she believes was COVID-19 (he did not get tested).  He had fevers, congestion, loss of taste/smell, and generalized weakness that took several weeks to improve.  He is still somewhat weak but is otherwise back to normal.  He remains on 5 L of supplemental oxygen.  He denies chest pain, palpitations, and edema.  He has occasional dizziness but has not fallen or passed out.  Chronic shortness of breath is back to baseline.  He inquires about obtaining a new wheelchair; he returned to his previously prescribed wheelchair as it was too heavy for his family to transport with him. ? ?-------------------------------------------------------------------------------------------------- ? ?Past Medical History:  ?Diagnosis Date  ? AAA (abdominal aortic aneurysm) ([place])   ? a. 3.8 cm CTA 05/2015 b. 3.9 cm by [place] in 11/2017  ? Anxiety   ? COPD (chronic obstructive pulmonary disease) ([place])   ? on home O2 4LPM  ? Diverticulosis   ? Former  smoker   ? quit 2001  ? Hemorrhoids   ? Kidney stones   ? passed spontaneously  ? OSA on CPAP   ? last study- in the home possibility- 2015, pt. unsure   ? Prediabetes   ? Hgb A1C 5.8 in Jan 2017  ? PUD (peptic ulcer disease)   ? Right knee DJD 10/31/2011  ? Severe  ? S/P TAVR (transcatheter aortic valve replacement) 06/26/2015  ? 26 mm Edwards Sapien 3 transcatheter heart valve placed via percutaneous right transfemoral approach  ? Severe aortic stenosis   ? a. Echo 11/16:  Mod LVH, EF 55-60%, no RWMA, Gr 1 DD, severe AS (mean 51 mmHg; peak 86 mmHg), MAC, mild LAE  b. s/p TAVR on 06/26/2015 with 63m Edward Sapien 3 THV  ? Shortness of breath dyspnea   ? ?Past Surgical History:  ?Procedure Laterality Date  ? CARDIAC CATHETERIZATION N/A 05/15/2015  ? Procedure: Right/Left Heart Cath and Coronary Angiography;  Surgeon: MSherren Mocha MD;  Location: [facility];  Service: Cardiovascular;  Laterality: N/A;  ? CHOLECYSTECTOMY    ? EYE SURGERY    ? bilateral cataracts removed, /w IOL  ? KNEE SURGERY Right 1985  ? arthrosopic -   ? TEE WITHOUT CARDIOVERSION N/A 06/26/2015  ? Procedure: TRANSESOPHAGEAL ECHOCARDIOGRAM (TEE);  Surgeon: MSherren Mocha MD;  Location: [facility]  Service: Open Heart Surgery;  Laterality: N/A;  ? TONSILLECTOMY    ? TRANSCATHETER AORTIC VALVE REPLACEMENT, TRANSFEMORAL N/A 06/26/2015  ? Procedure: TRANSCATHETER AORTIC VALVE REPLACEMENT, TRANSFEMORAL;  Surgeon: MSherren Mocha MD;  Location:  [facility];  Service: Open Heart Surgery;  Laterality: N/A;  ? ? ?Current Meds  ?Medication Sig  ? albuterol (PROAIR HFA) 108 (90 Base) MCG/ACT inhaler INHALE 1-2 PUFFS INTO THE LUNGS EVERY 6 (SIX) HOURS AS NEEDED FOR WHEEZING.  ? aspirin EC 81 MG tablet Take 1 tablet (81 mg total) by mouth daily.  ? furosemide (LASIX) 40 MG tablet Take 1 tablet (40 mg total) by mouth daily.  ? LORazepam (ATIVAN) 1 MG tablet TAKE 1/4 TO 1/2 TABLET BY MOUTH EVERY 6 HOURS AS NEEDED  ? nystatin (MYCOSTATIN/NYSTOP) powder Apply 1  application. topically 3 (three) times daily as needed.  ? OXYGEN Inhale into the lungs. 5 Liters  ? Respiratory Therapy Supplies (FLUTTER) DEVI Use as directed  ? SYMBICORT 160-4.5 MCG/ACT inhaler TAKE 2 PUFFS BY MOUTH TWICE A DAY  ? vitamin C (ASCORBIC ACID) 500 MG tablet Take 500 mg by mouth daily.  ? [DISCONTINUED] losartan (COZAAR) 25 MG tablet Take 0.5 tablets (12.5 mg total) by mouth daily.  ? ? ?Allergies: Iohexol, Atorvastatin, Celecoxib, Clinoril [sulindac], Nitrofurantoin, Spiriva handihaler [tiotropium bromide monohydrate], and Sulfa antibiotics ? ?Social History  ? ?Tobacco Use  ? Smoking status: Former  ?  Packs/day: 1.50  ?  Years: 53.00  ?  Pack years: 79.50  ?  Types: Cigarettes  ?  Quit date: 06/23/1999  ?  Years since quitting: 22.2  ? Smokeless tobacco: Former  ?  Types: Snuff, Chew  ?Vaping Use  ? Vaping Use: Never used  ?Substance Use Topics  ? Alcohol use: No  ? Drug use: No  ? ? ?Family History  ?Problem Relation Age of Onset  ? Stroke Mother   ? Heart attack Mother 40  ?     deceased  ? Prostate cancer Father 71  ? Prostate cancer Brother   ? Colon cancer Neg Hx   ? ? ?Review of Systems: ?A 12-system review of systems was performed and was negative except as noted in the HPI. ? ?-------------------------------------------------------------------------------------------------- ? ?Physical Exam: ?BP 128/70 (BP Location: Left Arm, Patient Position: Sitting, Cuff Size: Large)   Pulse 96   Ht _0  (1.778 m)   Wt 240 lb (108.9 kg) Comment: reported by patient-unable to stand on scale  SpO2 95%   BMI 34.44 kg/m?  ? ?General:  NAD. ?Neck: No JVD or HJR. ?Lungs: Mildly diminished breath sounds throughout with scattered expiratory wheezes.  No crackles. ?Heart: Regular rate and rhythm with 1/6 systolic murmur.  No rubs or gallops. ?Abdomen: Soft, nontender, nondistended. ?Extremities: No lower extremity edema. ? ?Lab Results  ?Component Value Date  ? WBC 7.8 01/16/2021  ? HGB 10.7 (L) 01/16/2021   ? HCT 34.0 (L) 01/16/2021  ? MCV 99.4 01/16/2021  ? PLT 213 01/16/2021  ? ? ?Lab Results  ?Component Value Date  ? NA 146 (H) 05/20/2021  ? K 4.3 05/20/2021  ? CL 97 05/20/2021  ? CO2 40 (H) 05/20/2021  ? BUN 16 05/20/2021  ? CREATININE 1.00 05/20/2021  ? GLUCOSE 138 (H) 05/20/2021  ? ALT 10 01/16/2021  ? ? ?Lab Results  ?Component Value Date  ? CHOL 176 01/16/2021  ? HDL 53 01/16/2021  ? LDLCALC 108 (H) 01/16/2021  ? TRIG 77 01/16/2021  ? CHOLHDL 3.3 01/16/2021  ? ? ?-------------------------------------------------------------------------------------------------- ? ?ASSESSMENT AND PLAN: ?Chronic HFpEF: ?Mr. Welte appears euvolemic on exam.  Chronic dyspnea is likely driven primarily by underlying lung disease with some component of HFpEF.  Symptoms currently consistent with NYHA class  III.  We will continue current regimen of furosemide and losartan, as blood pressure is well controlled today. ? ?Aortic stenosis status post TAVR: ?No clinical signs or symptoms of valve failure.  Echocardiogram last year showed appropriate valve function.  Continue low-dose aspirin and SBE prophylaxis. ? ?Chronic respiratory failure with hypoxia due to COPD: ?Breathing back to baseline following infection a few months ago (patient believes this was COVID-19).  He should continue his current medications and ongoing follow-up through Drs. Damita Dunnings and [place]. ? ?2:1 AV block: ?Occasional transient dizziness noted though otherwise Mr. Bonet is asymptomatic.  Exam today is notable for a regular rate and rhythm.  Mr. Rosol was previously evaluated by Dr. Quentin Ore (EP) with decision to defer pacemaker placement. ? ?Hypertension: ?Blood pressure well controlled today.  Continue low-dose losartan. ? ?Follow-up: Return to clinic in 6 months. ? ?Nelva Bush, MD ?09/26/2021 ?7:45 AM ? ?

## 2021-09-25 NOTE — Telephone Encounter (Signed)
Pt wife called stating that pt needs authorization for a wheelchair. Pt wife is asking if you could mail the authorization. Please advise. ?

## 2021-09-25 NOTE — Patient Instructions (Signed)
Medication Instructions:  Your physician recommends that you continue on your current medications as directed. Please refer to the Current Medication list given to you today.   *If you need a refill on your cardiac medications before your next appointment, please call your pharmacy*   Lab Work: None ordered  If you have labs (blood work) drawn today and your tests are completely normal, you will receive your results only by: MyChart Message (if you have MyChart) OR A paper copy in the mail If you have any lab test that is abnormal or we need to change your treatment, we will call you to review the results.   Testing/Procedures: None ordered  Follow-Up: At [facility], you and your health needs are our priority.  As part of our continuing mission to provide you with exceptional heart care, we have created designated Provider Care Teams.  These Care Teams include your primary Cardiologist (physician) and Advanced Practice Providers (APPs -  Physician Assistants and Nurse Practitioners) who all work together to provide you with the care you need, when you need it.  We recommend signing up for the patient portal called "MyChart".  Sign up information is provided on this After Visit Summary.  MyChart is used to connect with patients for Virtual Visits (Telemedicine).  Patients are able to view lab/test results, encounter notes, upcoming appointments, etc.  Non-urgent messages can be sent to your provider as well.   To learn more about what you can do with MyChart, go to [email or web address].    Your next appointment:   6 month(s)  The format for your next appointment:   In Person  Provider:   You may see Christopher End, MD or one of the following Advanced Practice Providers on your designated Care Team:   Christopher Berge, NP Ryan Dunn, PA-C Cadence Furth, PA-C{  Important Information About [place]       

## 2021-09-26 ENCOUNTER — Encounter: Payer: Self-pay | Admitting: Internal Medicine

## 2021-09-27 NOTE — Telephone Encounter (Signed)
Spoke with Mrs [name] [name], patient had an order for wheelchair done in March 2022 by Dr [name] [name] and they did get wheelchair but it was very heavy and hard to use this for patient's wife and daughter. [name] [name] would like to try and get a lightweight wheelchair if possible. They turned in the wheelchair they had to [facility] about 2 weeks ago and they were not charged for this chair even though they got a bill for over $500.  ?

## 2021-09-27 NOTE — Telephone Encounter (Signed)
I called Darlina Guys with [employer] to get information on this and how we proceed as far as ordering/billing etc. ?

## 2021-09-30 NOTE — Telephone Encounter (Signed)
I sent a community message to Darlina Guys about this to see if we can get an answer to help patient.  ?

## 2021-10-08 NOTE — Telephone Encounter (Signed)
Mrs Deitrick, called back to follow up, she said they found out that [employer] in [place] has lighter weigh wheelchairs but would need an order. Mrs Lill would like to get this done asap since they have been trying for a while and have not heard back. Patient is not able to get around. ? ?Please advised Mrs Samaan on this either way. Thank you ?

## 2021-10-09 ENCOUNTER — Telehealth: Payer: Self-pay

## 2021-10-09 DIAGNOSIS — L89321 Pressure ulcer of left buttock, stage 1: Secondary | ICD-10-CM | POA: Diagnosis not present

## 2021-10-09 DIAGNOSIS — L89311 Pressure ulcer of right buttock, stage 1: Secondary | ICD-10-CM | POA: Diagnosis not present

## 2021-10-09 NOTE — Telephone Encounter (Signed)
Noted. Thanks.

## 2021-10-09 NOTE — Telephone Encounter (Signed)
Seen by a NP today. Asking if Dr Damita Dunnings can help pt get a hospital bed. I advised her that he would need an office visit to document the need for a hospital bed. He has an appt tomorrow (4-27). Sending to Dr Damita Dunnings as an [facility]. ?

## 2021-10-09 NOTE — Telephone Encounter (Signed)
Please send order for lightweight manual wheelchair.  Dx J96.11.  Thanks.  ?

## 2021-10-09 NOTE — Telephone Encounter (Signed)
New DME order has been done and printed for patient to pick up and take to [employer]. Patient and wife aware and will pick up today or tomorrow when possible.  ?

## 2021-10-09 NOTE — Addendum Note (Signed)
Addended by: Sherrilee Gilles B on: 10/09/2021 02:30 PM ? ? Modules accepted: Orders ? ?

## 2021-10-10 ENCOUNTER — Encounter: Payer: Self-pay | Admitting: Family Medicine

## 2021-10-10 ENCOUNTER — Telehealth (INDEPENDENT_AMBULATORY_CARE_PROVIDER_SITE_OTHER): Payer: PPO | Admitting: Family Medicine

## 2021-10-10 DIAGNOSIS — G4733 Obstructive sleep apnea (adult) (pediatric): Secondary | ICD-10-CM | POA: Diagnosis not present

## 2021-10-10 DIAGNOSIS — I5032 Chronic diastolic (congestive) heart failure: Secondary | ICD-10-CM | POA: Diagnosis not present

## 2021-10-10 NOTE — Progress Notes (Signed)
Virtual visit completed through WebEx or similar program ?Patient location: home  ?Provider location: Financial controller at [facility], office  ?Participants: Patient and me (unless stated otherwise below) ? ?Pandemic considerations d/w pt.  ? ?Limitations and rationale for visit method d/w patient.  Patient agreed to proceed.  ? ?CC: CHF ? ?HPI: ? ?Trouble getting in and out of a regular bed.  Having to sleep in a recliner.  He needs help with repositioning and that isn't possible in a regular bed.  He can't roll on his own.  Rails with adjustment for head and feet.  Lasix helps with BLE edema but he needs help with BLE elevation.   ? ?He is still using CPAP at night.  He is using [employer] for home O2.   ? ?Using inhalers at baseline for chest congestion.  Then cough drop or similar if needed.  No fevers.  An adjustable bed may help with sputum clearing, with head of bed elevated.  D/w pt about taking robitussin BID scheduled  ? ?He was asking about changing to a different CPAP mask.  He is getting irritation on his face.  I told him I would ask [employer] if they will send a different mask.   ? ?Meds and allergies reviewed.  ? ?ROS: Per HPI unless specifically indicated in ROS section  ? ?NAD ?Speech wnl ? ?A/P: ?OSA. He was asking about changing to a different CPAP mask.  He is getting irritation on his face.  I told him I would ask [employer] if they will send a different mask.   ? ?CHF.  He requires repositioning not feasible in an ordinary bed.  This is due to edema and shortness of breath.  He needs to be able to elevate his head and his legs.  He also needs help with rails to be able to get in and out of the bed.  We will check with [employer] about an adjustable bed (able to elevate head and feet) with rails.   ?

## 2021-10-13 ENCOUNTER — Telehealth: Payer: Self-pay | Admitting: Family Medicine

## 2021-10-13 DIAGNOSIS — I5032 Chronic diastolic (congestive) heart failure: Secondary | ICD-10-CM

## 2021-10-13 NOTE — Telephone Encounter (Signed)
I need help with orders with this patient.   ? ?He is using [facility] for medical equipment. ? ?Please see if they will send out a different type of CPAP mask.  Diagnosis sleep apnea.  He is getting irritation with his current mask. ? ?He needs a bed with rails they can adjust the head and feet.  He requires repositioning not feasible in an ordinary bed.  This is due to edema and shortness of breath.  He needs to be able to elevate his head and his legs.  He also needs help with rails to be able to get in and out of the bed.  Diagnosis CHF.  Thanks. ?

## 2021-10-13 NOTE — Assessment & Plan Note (Signed)
CHF.  He requires repositioning not feasible in an ordinary bed.  This is due to edema and shortness of breath.  He needs to be able to elevate his head and his legs.  He also needs help with rails to be able to get in and out of the bed.  We will check with [employer] about an adjustable bed (able to elevate head and feet) with rails.   ?

## 2021-10-13 NOTE — Assessment & Plan Note (Signed)
OSA. He was asking about changing to a different CPAP mask.  He is getting irritation on his face.  I told him I would ask [employer] if they will send a different mask.   ?

## 2021-10-14 DIAGNOSIS — L89311 Pressure ulcer of right buttock, stage 1: Secondary | ICD-10-CM | POA: Diagnosis not present

## 2021-10-14 DIAGNOSIS — L89321 Pressure ulcer of left buttock, stage 1: Secondary | ICD-10-CM | POA: Diagnosis not present

## 2021-10-16 NOTE — Addendum Note (Signed)
Addended by: Sherrilee Gilles B on: 10/16/2021 09:42 AM ? ? Modules accepted: Orders ? ?

## 2021-10-16 NOTE — Telephone Encounter (Signed)
Orders have been done and faxed to [employer]. ?

## 2021-10-18 DIAGNOSIS — I5020 Unspecified systolic (congestive) heart failure: Secondary | ICD-10-CM | POA: Diagnosis not present

## 2021-10-23 ENCOUNTER — Telehealth: Payer: Self-pay | Admitting: Family Medicine

## 2021-10-23 NOTE — Telephone Encounter (Signed)
Levon Hedger (Spouse) called and wanted Dr. Damita Dunnings to know that "the bed he order for her husband is not working at all, states he is having trouble sleeping and getting comfortable." Wants to speak with the nurse to see "if there is something else they can try." ? ?Callback Number: [phone]  ?

## 2021-10-24 ENCOUNTER — Other Ambulatory Visit: Payer: Self-pay | Admitting: Family Medicine

## 2021-10-24 NOTE — Telephone Encounter (Signed)
Refill request for LORAZEPAM 1 MG TABLET ? ?LOV - 10/10/21 ?Next OV - not scheduled ?Last refill - 01/08/21 #30/1 ? ?

## 2021-10-25 NOTE — Telephone Encounter (Signed)
LMTCB to discuss.

## 2021-10-28 ENCOUNTER — Encounter: Payer: Self-pay | Admitting: Family Medicine

## 2021-10-28 DIAGNOSIS — I5030 Unspecified diastolic (congestive) heart failure: Secondary | ICD-10-CM | POA: Diagnosis not present

## 2021-10-28 DIAGNOSIS — G4733 Obstructive sleep apnea (adult) (pediatric): Secondary | ICD-10-CM | POA: Diagnosis not present

## 2021-10-28 DIAGNOSIS — F330 Major depressive disorder, recurrent, mild: Secondary | ICD-10-CM | POA: Diagnosis not present

## 2021-10-28 DIAGNOSIS — E261 Secondary hyperaldosteronism: Secondary | ICD-10-CM | POA: Diagnosis not present

## 2021-10-28 DIAGNOSIS — I739 Peripheral vascular disease, unspecified: Secondary | ICD-10-CM | POA: Diagnosis not present

## 2021-10-28 DIAGNOSIS — Z66 Do not resuscitate: Secondary | ICD-10-CM | POA: Diagnosis not present

## 2021-10-28 DIAGNOSIS — Z515 Encounter for palliative care: Secondary | ICD-10-CM | POA: Diagnosis not present

## 2021-10-28 DIAGNOSIS — J449 Chronic obstructive pulmonary disease, unspecified: Secondary | ICD-10-CM | POA: Diagnosis not present

## 2021-10-28 DIAGNOSIS — J9610 Chronic respiratory failure, unspecified whether with hypoxia or hypercapnia: Secondary | ICD-10-CM | POA: Diagnosis not present

## 2021-10-28 DIAGNOSIS — L89321 Pressure ulcer of left buttock, stage 1: Secondary | ICD-10-CM | POA: Diagnosis not present

## 2021-10-28 DIAGNOSIS — Z6834 Body mass index (BMI) 34.0-34.9, adult: Secondary | ICD-10-CM | POA: Diagnosis not present

## 2021-10-28 DIAGNOSIS — L89311 Pressure ulcer of right buttock, stage 1: Secondary | ICD-10-CM | POA: Diagnosis not present

## 2021-10-28 NOTE — Telephone Encounter (Signed)
Patient's wife would like order sent to  ?[employer]  ?Supplies in [place]. ?

## 2021-10-30 ENCOUNTER — Telehealth: Payer: Self-pay | Admitting: Family Medicine

## 2021-10-30 NOTE — Telephone Encounter (Signed)
Spoke with patients daugther about this. The issue was patient did not fit in the bed that was sent. The company has already come and picked up the bed. They are going to explore other options. ?

## 2021-10-30 NOTE — Telephone Encounter (Signed)
Spoke with patients daughter about this and nothing further is needed from [place]. They will be paying for this out of pocket. ?

## 2021-10-30 NOTE — Telephone Encounter (Signed)
Patients wife has called stating the request sent to [facility] for a wheelchair. Stated they are getting a trasnfer chair instead and does not need authorization  ?

## 2021-11-01 ENCOUNTER — Encounter: Payer: Self-pay | Admitting: Family Medicine

## 2021-11-01 ENCOUNTER — Ambulatory Visit (INDEPENDENT_AMBULATORY_CARE_PROVIDER_SITE_OTHER): Payer: PPO | Admitting: Family Medicine

## 2021-11-01 VITALS — BP 120/48 | HR 83 | Temp 98.0°F

## 2021-11-01 DIAGNOSIS — R0602 Shortness of breath: Secondary | ICD-10-CM | POA: Diagnosis not present

## 2021-11-01 DIAGNOSIS — I5032 Chronic diastolic (congestive) heart failure: Secondary | ICD-10-CM | POA: Diagnosis not present

## 2021-11-01 MED ORDER — LORATADINE 10 MG PO TABS: 10.0000 mg | ORAL_TABLET | Freq: Every day | ORAL | Status: DC

## 2021-11-01 NOTE — Patient Instructions (Addendum)
If your weight is 235 or below, take 1 pill of lasix a day.  If your weight is anything above 235 pounds in the morning, then take 1.5 tabs of lasix that day.   Go to the lab on the way out.   If you have mychart we'll likely use that to update you.     I'll update cardiology.   Take claritin if needed for itching on the legs.   Take care.  Glad to see you.

## 2021-11-01 NOTE — Progress Notes (Signed)
Recently taking 1.5 tabs of lasix a day, previously/usually taking 1 tab '40mg'$  a day. He had inc in UOP on higher dose of lasix.  No CP.  Still on O2.  SOB is at baseline.  Some congestion (head and chest) noted at night.  Robitussin helps.  No fevers.  Weight decreased from 327 lbs to 232 lbs, AM weight.  Recheck BP 110/50.  Fatigued, recently noted.  We talked about dry weight and Lasix dosing, balancing renal perfusion and diuresis, etc.  He has some irritation on the left side of his face from his nasal cannula tubing.  I gave him some tube gauze that he can slide over the tubing at home for extra cushion.  He is noted some itching on his legs, also on the upper chest wall  Meds, vitals, and allergies reviewed.   ROS: Per HPI unless specifically indicated in ROS section   Nad [place] Neck supple, no LA Band-Aid on left side of the face noted, covering irritation from tubing from nasal cannula On O2 at baseline . Rrr Ctab, no focal decrease in breath sounds Abdomen soft Skin well perfused In wheelchair. Chronic irritation noted on the shins bilaterally.

## 2021-11-02 LAB — CBC WITH DIFFERENTIAL/PLATELET
Absolute Monocytes: 571 cells/uL (ref 200–950)
Basophils Absolute: 27 cells/uL (ref 0–200)
Basophils Relative: 0.4 %
Eosinophils Absolute: 360 cells/uL (ref 15–500)
Eosinophils Relative: 5.3 %
HCT: 26.9 % — ABNORMAL LOW (ref 38.5–50.0)
Hemoglobin: 8.6 g/dL — ABNORMAL LOW (ref 13.2–17.1)
Lymphs Abs: 1068 cells/uL (ref 850–3900)
MCH: 30.4 pg (ref 27.0–33.0)
MCHC: 32 g/dL (ref 32.0–36.0)
MCV: 95.1 fL (ref 80.0–100.0)
MPV: 9.7 fL (ref 7.5–12.5)
Monocytes Relative: 8.4 %
Neutro Abs: 4774 cells/uL (ref 1500–7800)
Neutrophils Relative %: 70.2 %
Platelets: 253 10*3/uL (ref 140–400)
RBC: 2.83 10*6/uL — ABNORMAL LOW (ref 4.20–5.80)
RDW: 12.4 % (ref 11.0–15.0)
Total Lymphocyte: 15.7 %
WBC: 6.8 10*3/uL (ref 3.8–10.8)

## 2021-11-02 LAB — BASIC METABOLIC PANEL
BUN: 21 mg/dL (ref 7–25)
CO2: 35 mmol/L — ABNORMAL HIGH (ref 20–32)
Calcium: 8.6 mg/dL (ref 8.6–10.3)
Chloride: 96 mmol/L — ABNORMAL LOW (ref 98–110)
Creat: 1.01 mg/dL (ref 0.70–1.22)
Glucose, Bld: 101 mg/dL — ABNORMAL HIGH (ref 65–99)
Potassium: 4 mmol/L (ref 3.5–5.3)
Sodium: 142 mmol/L (ref 135–146)

## 2021-11-02 LAB — BRAIN NATRIURETIC PEPTIDE: Brain Natriuretic Peptide: 18 pg/mL (ref <100)

## 2021-11-03 ENCOUNTER — Other Ambulatory Visit: Payer: Self-pay | Admitting: Family Medicine

## 2021-11-03 DIAGNOSIS — D649 Anemia, unspecified: Secondary | ICD-10-CM

## 2021-11-03 DIAGNOSIS — Z1211 Encounter for screening for malignant neoplasm of colon: Secondary | ICD-10-CM

## 2021-11-03 MED ORDER — IRON (FERROUS SULFATE) 325 (65 FE) MG PO TABS: 325.0000 mg | ORAL_TABLET | Freq: Every day | ORAL | Status: DC

## 2021-11-03 NOTE — Assessment & Plan Note (Signed)
He likely has dermatitis changes that are related to lower extremity edema.  Reasonable to use Claritin as needed for itching.   Discussed dosing his Lasix based on his weight.  If AM weight is 235 or below, take 1 pill of lasix a day.  If AM weight is anything above 235 pounds in the morning, then take 1.5 tabs of lasix that day.   See notes on labs. I'll update cardiology.   He agrees with plan.  Okay for outpatient follow-up.  30 minutes were devoted to patient care in this encounter (this includes time spent reviewing the patient's file/history, interviewing and examining the patient, counseling/reviewing plan with patient).

## 2021-11-06 DIAGNOSIS — G4733 Obstructive sleep apnea (adult) (pediatric): Secondary | ICD-10-CM | POA: Diagnosis not present

## 2021-11-12 ENCOUNTER — Other Ambulatory Visit: Payer: PPO

## 2021-11-21 ENCOUNTER — Other Ambulatory Visit: Payer: PPO

## 2021-11-26 ENCOUNTER — Other Ambulatory Visit (INDEPENDENT_AMBULATORY_CARE_PROVIDER_SITE_OTHER): Payer: PPO

## 2021-11-26 DIAGNOSIS — D649 Anemia, unspecified: Secondary | ICD-10-CM

## 2021-11-26 DIAGNOSIS — Z1211 Encounter for screening for malignant neoplasm of colon: Secondary | ICD-10-CM | POA: Diagnosis not present

## 2021-11-26 NOTE — Progress Notes (Cosign Needed Addendum)
Pt came in for labs and daughter asked to have his BP checked. Per Dr Damita Dunnings, I checked his BP and went over his med list.  He is c/o of legs feeling weak. Otherwise, he feels fine. Wonders if his losartan needs to be stopped all together. He was decreased to half a '25mg'$  recently for the same issue. Michela Pitcher it was mentioned at a recent OV that Lasix may need to be decreased, also, if he continued to have low BP.  At home 2 days ago got 118/48. Takes him meds in the am.   Asking what else do they need to do.   11/26/2021 2:48 PM 11/26/2021 2:49 PM  BP 114/58 114/54  BP Location Right Arm Left Arm  Patient Position Sitting   Cuff Size Large Large

## 2021-11-27 ENCOUNTER — Other Ambulatory Visit: Payer: Self-pay | Admitting: Family Medicine

## 2021-11-27 LAB — IRON: Iron: 66 ug/dL (ref 42–165)

## 2021-11-27 LAB — CBC WITH DIFFERENTIAL/PLATELET
Basophils Absolute: 0.1 10*3/uL (ref 0.0–0.1)
Basophils Relative: 0.9 % (ref 0.0–3.0)
Eosinophils Absolute: 0.5 10*3/uL (ref 0.0–0.7)
Eosinophils Relative: 8.1 % — ABNORMAL HIGH (ref 0.0–5.0)
HCT: 25.4 % — ABNORMAL LOW (ref 39.0–52.0)
Hemoglobin: 8.2 g/dL — ABNORMAL LOW (ref 13.0–17.0)
Lymphocytes Relative: 20.5 % (ref 12.0–46.0)
Lymphs Abs: 1.2 10*3/uL (ref 0.7–4.0)
MCHC: 32.2 g/dL (ref 30.0–36.0)
MCV: 95.4 fl (ref 78.0–100.0)
Monocytes Absolute: 0.4 10*3/uL (ref 0.1–1.0)
Monocytes Relative: 6.9 % (ref 3.0–12.0)
Neutro Abs: 3.9 10*3/uL (ref 1.4–7.7)
Neutrophils Relative %: 63.6 % (ref 43.0–77.0)
Platelets: 229 10*3/uL (ref 150.0–400.0)
RBC: 2.66 Mil/uL — ABNORMAL LOW (ref 4.22–5.81)
RDW: 15 % (ref 11.5–15.5)
WBC: 6.1 10*3/uL (ref 4.0–10.5)

## 2021-11-27 NOTE — Progress Notes (Signed)
Called patients daughter Hilda Blades about stopping the losartan and advised her to call back in 10 days with updates about BP and health.

## 2021-11-27 NOTE — Progress Notes (Signed)
Would stop losartan and update me about his BP and status in about 10 days.  Thanks.

## 2021-11-27 NOTE — Addendum Note (Signed)
Addended by: Ellamae Sia on: 11/27/2021 08:51 AM   Modules accepted: Orders

## 2021-12-01 ENCOUNTER — Other Ambulatory Visit: Payer: Self-pay | Admitting: Family Medicine

## 2021-12-01 DIAGNOSIS — D649 Anemia, unspecified: Secondary | ICD-10-CM

## 2021-12-09 ENCOUNTER — Inpatient Hospital Stay: Payer: PPO

## 2021-12-09 ENCOUNTER — Encounter: Payer: Self-pay | Admitting: Oncology

## 2021-12-09 ENCOUNTER — Inpatient Hospital Stay: Payer: PPO | Attending: Oncology | Admitting: Oncology

## 2021-12-09 VITALS — BP 98/46 | HR 82 | Temp 97.3°F | Resp 16 | Ht 70.0 in | Wt 239.0 lb

## 2021-12-09 DIAGNOSIS — I350 Nonrheumatic aortic (valve) stenosis: Secondary | ICD-10-CM | POA: Diagnosis not present

## 2021-12-09 DIAGNOSIS — Z882 Allergy status to sulfonamides status: Secondary | ICD-10-CM | POA: Diagnosis not present

## 2021-12-09 DIAGNOSIS — I10 Essential (primary) hypertension: Secondary | ICD-10-CM | POA: Diagnosis not present

## 2021-12-09 DIAGNOSIS — F419 Anxiety disorder, unspecified: Secondary | ICD-10-CM | POA: Insufficient documentation

## 2021-12-09 DIAGNOSIS — Z79899 Other long term (current) drug therapy: Secondary | ICD-10-CM | POA: Diagnosis not present

## 2021-12-09 DIAGNOSIS — D649 Anemia, unspecified: Secondary | ICD-10-CM

## 2021-12-09 DIAGNOSIS — Z8711 Personal history of peptic ulcer disease: Secondary | ICD-10-CM | POA: Diagnosis not present

## 2021-12-09 DIAGNOSIS — Z87891 Personal history of nicotine dependence: Secondary | ICD-10-CM | POA: Diagnosis not present

## 2021-12-09 DIAGNOSIS — N400 Enlarged prostate without lower urinary tract symptoms: Secondary | ICD-10-CM | POA: Insufficient documentation

## 2021-12-09 DIAGNOSIS — G4733 Obstructive sleep apnea (adult) (pediatric): Secondary | ICD-10-CM | POA: Insufficient documentation

## 2021-12-09 DIAGNOSIS — R0602 Shortness of breath: Secondary | ICD-10-CM | POA: Insufficient documentation

## 2021-12-09 DIAGNOSIS — Z8249 Family history of ischemic heart disease and other diseases of the circulatory system: Secondary | ICD-10-CM | POA: Diagnosis not present

## 2021-12-09 DIAGNOSIS — Z8042 Family history of malignant neoplasm of prostate: Secondary | ICD-10-CM | POA: Insufficient documentation

## 2021-12-09 DIAGNOSIS — Z8719 Personal history of other diseases of the digestive system: Secondary | ICD-10-CM | POA: Insufficient documentation

## 2021-12-09 DIAGNOSIS — Z823 Family history of stroke: Secondary | ICD-10-CM | POA: Diagnosis not present

## 2021-12-09 DIAGNOSIS — E1165 Type 2 diabetes mellitus with hyperglycemia: Secondary | ICD-10-CM | POA: Insufficient documentation

## 2021-12-09 DIAGNOSIS — Z9049 Acquired absence of other specified parts of digestive tract: Secondary | ICD-10-CM | POA: Insufficient documentation

## 2021-12-09 DIAGNOSIS — R5383 Other fatigue: Secondary | ICD-10-CM | POA: Diagnosis not present

## 2021-12-09 DIAGNOSIS — Z881 Allergy status to other antibiotic agents status: Secondary | ICD-10-CM | POA: Diagnosis not present

## 2021-12-09 LAB — CBC WITH DIFFERENTIAL/PLATELET
Abs Immature Granulocytes: 0.02 10*3/uL (ref 0.00–0.07)
Basophils Absolute: 0.1 10*3/uL (ref 0.0–0.1)
Basophils Relative: 1 %
Eosinophils Absolute: 0.5 10*3/uL (ref 0.0–0.5)
Eosinophils Relative: 8 %
HCT: 29.1 % — ABNORMAL LOW (ref 39.0–52.0)
Hemoglobin: 8.8 g/dL — ABNORMAL LOW (ref 13.0–17.0)
Immature Granulocytes: 0 %
Lymphocytes Relative: 19 %
Lymphs Abs: 1.3 10*3/uL (ref 0.7–4.0)
MCH: 30.4 pg (ref 26.0–34.0)
MCHC: 30.2 g/dL (ref 30.0–36.0)
MCV: 100.7 fL — ABNORMAL HIGH (ref 80.0–100.0)
Monocytes Absolute: 0.5 10*3/uL (ref 0.1–1.0)
Monocytes Relative: 7 %
Neutro Abs: 4.4 10*3/uL (ref 1.7–7.7)
Neutrophils Relative %: 65 %
Platelets: 222 10*3/uL (ref 150–400)
RBC: 2.89 MIL/uL — ABNORMAL LOW (ref 4.22–5.81)
RDW: 14.1 % (ref 11.5–15.5)
WBC: 6.8 10*3/uL (ref 4.0–10.5)
nRBC: 0 % (ref 0.0–0.2)

## 2021-12-09 LAB — FOLATE: Folate: 9.8 ng/mL (ref >5.9)

## 2021-12-09 LAB — COMPREHENSIVE METABOLIC PANEL
ALT: 12 U/L (ref 0–44)
AST: 22 U/L (ref 15–41)
Albumin: 2.9 g/dL — ABNORMAL LOW (ref 3.5–5.0)
Alkaline Phosphatase: 107 U/L (ref 38–126)
Anion gap: 8 (ref 5–15)
BUN: 25 mg/dL — ABNORMAL HIGH (ref 8–23)
CO2: 38 mmol/L — ABNORMAL HIGH (ref 22–32)
Calcium: 8.4 mg/dL — ABNORMAL LOW (ref 8.9–10.3)
Chloride: 94 mmol/L — ABNORMAL LOW (ref 98–111)
Creatinine, Ser: 1.22 mg/dL (ref 0.61–1.24)
GFR, Estimated: 58 mL/min — ABNORMAL LOW (ref >60)
Glucose, Bld: 115 mg/dL — ABNORMAL HIGH (ref 70–99)
Potassium: 3.7 mmol/L (ref 3.5–5.1)
Sodium: 140 mmol/L (ref 135–145)
Total Bilirubin: 0.7 mg/dL (ref 0.3–1.2)
Total Protein: 6.8 g/dL (ref 6.5–8.1)

## 2021-12-09 LAB — VITAMIN B12: Vitamin B-12: 275 pg/mL (ref 180–914)

## 2021-12-09 LAB — LACTATE DEHYDROGENASE: LDH: 168 U/L (ref 98–192)

## 2021-12-09 LAB — RETICULOCYTES
Immature Retic Fract: 20 % — ABNORMAL HIGH (ref 2.3–15.9)
RBC.: 2.89 MIL/uL — ABNORMAL LOW (ref 4.22–5.81)
Retic Count, Absolute: 80.1 10*3/uL (ref 19.0–186.0)
Retic Ct Pct: 2.8 % (ref 0.4–3.1)

## 2021-12-09 LAB — FERRITIN: Ferritin: 65 ng/mL (ref 24–336)

## 2021-12-09 LAB — TSH: TSH: 5.055 u[IU]/mL — ABNORMAL HIGH (ref 0.350–4.500)

## 2021-12-09 NOTE — Progress Notes (Signed)
Hematology/Oncology Consult note [facility] [phone] [phone]  Patient Care Team: Tonia Ghent, MD as PCP - General (Family Medicine) Vickie Epley, MD as PCP - Electrophysiology (Cardiology) End, Harrell Gave, MD as PCP - Cardiology (Cardiology) Noralee Space, MD as Consulting Physician (Pulmonary Disease) Nahser, Wonda Cheng, MD as Consulting Physician (Cardiology) Charlton Haws, [facility] as Pharmacist (Pharmacist)   Name of the patient: [name] [name]  [id number]  [birth year]    Reason for referral-anemia   Referring physician-Dr. Damita Dunnings  Date of visit: 12/09/21   History of presenting illness- Patient is a 88 year old male with a past medical history significant for chronic heart failure with preserved EF, obstructive sleep apnea, chronic respiratory failure, COPD, hypertension diabetes BPH among other medical problems.  He has been referred to [place] for anemia.Most recent CBC from 11/26/2021 showed H&H of 8.2/25.4 with an MCV of 95.4.  White count and platelet count were normal.  Serum iron was normal at 66.  Looking back at his prior CBCs his hemoglobin at baseline is between 12-13.  He had drifted down to 10.7 in August 2022 and 8.6 since May 2023.  Baseline creatinine in May 2023 was normal. Patient has chronic fatigue and exertional shortness of breath.  Appetite is good and weight has remained stable.   ECOG PS- 2-3  Pain scale- 3   Review of systems- Review of Systems  Constitutional:  Positive for malaise/fatigue. Negative for chills, fever and weight loss.  HENT:  Negative for congestion, ear discharge and nosebleeds.   Eyes:  Negative for blurred vision.  Respiratory:  Positive for shortness of breath. Negative for cough, hemoptysis, sputum production and wheezing.   Cardiovascular:  Negative for chest pain, palpitations, orthopnea and claudication.  Gastrointestinal:  Negative for abdominal pain, blood in stool,  constipation, diarrhea, heartburn, melena, nausea and vomiting.  Genitourinary:  Negative for dysuria, flank pain, frequency, hematuria and urgency.  Musculoskeletal:  Negative for back pain, joint pain and myalgias.  Skin:  Negative for rash.  Neurological:  Negative for dizziness, tingling, focal weakness, seizures, weakness and headaches.  Endo/Heme/Allergies:  Does not bruise/bleed easily.  Psychiatric/Behavioral:  Negative for depression and suicidal ideas. The patient does not have insomnia.     Allergies  Allergen Reactions   Iohexol Hives and Swelling    Pt reports swelling, redness, hives, and blisters    Atorvastatin     REACTION: aches   Celecoxib     REACTION: rash   Clinoril [Sulindac]     REACTION: rash   Nitrofurantoin    Spiriva Handihaler [Tiotropium Bromide Monohydrate] Other (See Comments)    Dry mouth   Sulfa Antibiotics     Intolerant but unrecalled.     Patient Active Problem List   Diagnosis Date Noted   Rash 06/17/2021   Pressure sore 06/17/2021   Olecranon bursitis 06/17/2021   Valvular heart disease 04/19/2021   Cellulitis of right leg 03/13/2021   Venous stasis ulcer ([place]) 03/13/2021   Heart block 01/17/2021   Blurry vision 11/07/2019   Healthcare maintenance 11/07/2019   Insomnia 04/01/2018   Myalgia 12/03/2016   Vitamin D deficiency 11/21/2016   Chronic heart failure with preserved ejection fraction (HFpEF) ([place]) 07/13/2016   AAA (abdominal aortic aneurysm) without rupture ([place]) 07/13/2016   Chronic obstructive pulmonary disease ([place]) 12/27/2015   Fecal urgency 09/14/2015   S/P TAVR (transcatheter aortic valve replacement) 06/26/2015   Severe aortic stenosis 05/15/2015   Medicare annual wellness visit, subsequent 05/21/2014  Advance care planning 05/21/2014   Skin irritation 12/20/2013   Risk for falls 12/20/2013   Rib pain on right side 01/29/2013   Vertigo 07/01/2012   Right knee DJD 10/31/2011   Knee pain 10/23/2011   OSA  (obstructive sleep apnea) 07/31/2009   Chronic respiratory failure with hypoxia ([place]) 08/03/2008   Morbid obesity ([place]) 02/22/2008   POLYP, COLON 08/27/2007   Anxiety state 08/27/2007   COPD (chronic obstructive pulmonary disease) with emphysema ([place]) 08/27/2007   INTERNAL HEMORRHOIDS 03/23/2007   DIVERTICULOSIS, COLON 03/23/2007   SYMPTOM, PAIN, ABDOMINAL, GENERALIZED 02/18/2007   HYPERGLYCEMIA 02/18/2007   Essential hypertension 02/10/2007   NEOPLASM, SKIN, UNCERTAIN BEHAVIOR 12/25/1973   PEPTIC ULCER DISEASE WITH H-PYLORI  TX'D 11/19/2006   NEPHROLITHIASIS 11/19/2006   Backache 11/19/2006   BPH (benign prostatic hyperplasia) 11/19/2006     Past Medical History:  Diagnosis Date   AAA (abdominal aortic aneurysm) ([place])    a. 3.8 cm CTA 05/2015 b. 3.9 cm by [place] in 11/2017   Anxiety    COPD (chronic obstructive pulmonary disease) ([place])    on home O2 4LPM   Diverticulosis    Former smoker    quit 2001   Hemorrhoids    Kidney stones    passed spontaneously   OSA on CPAP    last study- in the home possibility- 2015, pt. unsure    Prediabetes    Hgb A1C 5.8 in Jan 2017   PUD (peptic ulcer disease)    Right knee DJD 10/31/2011   Severe   S/P TAVR (transcatheter aortic valve replacement) 06/26/2015   26 mm Edwards Sapien 3 transcatheter heart valve placed via percutaneous right transfemoral approach   Severe aortic stenosis    a. Echo 11/16:  Mod LVH, EF 55-60%, no RWMA, Gr 1 DD, severe AS (mean 51 mmHg; peak 86 mmHg), MAC, mild LAE  b. s/p TAVR on 06/26/2015 with 46m Edward Sapien 3 THV   Shortness of breath dyspnea      Past Surgical History:  Procedure Laterality Date   CARDIAC CATHETERIZATION N/A 05/15/2015   Procedure: Right/Left Heart Cath and Coronary Angiography;  Surgeon: MSherren Mocha MD;  [facility];  Service: Cardiovascular;  Laterality: N/A;   CHOLECYSTECTOMY     EYE SURGERY     bilateral cataracts removed, /w IOL   KNEE SURGERY Right 1985    arthrosopic -    TEE WITHOUT CARDIOVERSION N/A 06/26/2015   Procedure: TRANSESOPHAGEAL ECHOCARDIOGRAM (TEE);  Surgeon: MSherren Mocha MD;  [facility]  Service: Open Heart Surgery;  Laterality: N/A;   TONSILLECTOMY     TRANSCATHETER AORTIC VALVE REPLACEMENT, TRANSFEMORAL N/A 06/26/2015   Procedure: TRANSCATHETER AORTIC VALVE REPLACEMENT, TRANSFEMORAL;  Surgeon: MSherren Mocha MD;  [facility]  Service: Open Heart Surgery;  Laterality: N/A;    Social History   Socioeconomic History   Marital status: Married    Spouse name: Not on file   Number of children: 1   Years of education: Not on file   Highest education level: Not on file  Occupational History   Occupation: Retired    Comment: [employer] 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     Social Determinants of Health   Financial Resource Strain: Low Risk  (07/08/2021)   Overall Financial Resource Strain (CARDIA)    Difficulty of Paying Living Expenses: Not hard at all  Food Insecurity: No Food Insecurity (07/08/2021)   Hunger Vital Sign    Worried About Running Out of Food in the Last Year: Never true    Ran Out of Food in the Last Year: Never true  Transportation Needs: No Transportation Needs (11/02/2019)   PRAPARE - Hydrologist (Medical): No    Lack of Transportation (Non-Medical): No  Physical Activity: Inactive (11/02/2019)   Exercise Vital Sign     Days of Exercise per Week: 0 days    Minutes of Exercise per Session: 0 min  Stress: No Stress Concern Present (11/02/2019)   [place]    Feeling of Stress : Not at all  Social Connections: Not on file  Intimate Partner Violence: Not At Risk (11/02/2019)   Humiliation, Afraid, Rape, and Kick questionnaire    Fear of Current or Ex-Partner: No    Emotionally Abused: No    Physically Abused: No    Sexually Abused: No     Family History  Problem Relation Age of Onset   Stroke Mother    Heart attack Mother 40       deceased   Prostate cancer Father 70   Prostate cancer Brother    Colon cancer Neg Hx      Current Outpatient Medications:    aspirin EC 81 MG tablet, Take 1 tablet (81 mg total) by mouth daily., Disp: , Rfl:    furosemide (LASIX) 40 MG tablet, Take 1 tablet (40 mg total) by mouth daily., Disp: 90 tablet, Rfl: 3   Iron, Ferrous Sulfate, 325 (65 Fe) MG TABS, Take 325 mg by mouth daily., Disp: , Rfl:    OXYGEN, Inhale into the lungs. 5 Liters, Disp: , Rfl:    Respiratory Therapy Supplies (FLUTTER) DEVI, Use as directed, Disp: 1 each, Rfl: 0   SYMBICORT 160-4.5 MCG/ACT inhaler, TAKE 2 PUFFS BY MOUTH TWICE A DAY, Disp: 30.6 each, Rfl: 3   albuterol (PROAIR HFA) 108 (90 Base) MCG/ACT inhaler, INHALE 1-2 PUFFS INTO THE LUNGS EVERY 6 (SIX) HOURS AS NEEDED FOR WHEEZING. (Patient not taking: Reported on 12/09/2021), Disp: 8.5 g, Rfl: 5   LORazepam (ATIVAN) 1 MG tablet, TAKE 1/4 TO 1/2 TABLET BY MOUTH EVERY 6 HOURS AS NEEDED (Patient not taking: Reported on 12/09/2021), Disp: 30 tablet, Rfl: 1   Physical exam:  Vitals:   12/09/21 1427  BP: (!) 98/46  Pulse: 82  Resp: 16  Temp: (!) 97.3 F (36.3 C)  TempSrc: Tympanic  SpO2: 92%  Weight: 239 lb (108.4 kg)  Height: 5' 10" (1.778 m)   Physical Exam Constitutional:      General: He is not in acute distress. Cardiovascular:     Rate and Rhythm: Normal rate and  regular rhythm.     Heart sounds: Normal heart sounds.  Pulmonary:     Effort:  Pulmonary effort is normal.     Breath sounds: Normal breath sounds.  Abdominal:     General: Bowel sounds are normal.     Palpations: Abdomen is soft.  Skin:    General: Skin is warm and dry.  Neurological:     Mental Status: He is alert and oriented to person, place, and time.           Latest Ref Rng & Units 11/01/2021    4:07 PM  CMP  Glucose 65 - 99 mg/dL 101   BUN 7 - 25 mg/dL 21   Creatinine 0.70 - 1.22 mg/dL 1.01   Sodium 135 - 146 mmol/L 142   Potassium 3.5 - 5.3 mmol/L 4.0   Chloride 98 - 110 mmol/L 96   CO2 20 - 32 mmol/L 35   Calcium 8.6 - 10.3 mg/dL 8.6       Latest Ref Rng & Units 11/26/2021    2:36 PM  CBC  WBC 4.0 - 10.5 K/uL 6.1   Hemoglobin 13.0 - 17.0 g/dL 8.2 Repeated and verified X2.   Hematocrit 39.0 - 52.0 % 25.4   Platelets 150.0 - 400.0 K/uL 229.0     Assessment and plan- Patient is a 88 y.o. male referred for anemia  Today I will do a complete anemia work-up including a CBC with differential CMP B12 folate ferritin haptoglobin reticulocyte TSH myeloma panel LDH and serum free light chain.  The cause of his anemia is uncertain we may have to proceed with a bone marrow biopsy   Thank you for this kind referral and the opportunity to participate in the care of this  Patient   Visit Diagnosis No diagnosis found.  Dr. Randa Evens, MD, MPH [facility] at [facility] [phone] 12/09/2021

## 2021-12-10 LAB — KAPPA/LAMBDA LIGHT CHAINS
Kappa free light chain: 126 mg/L — ABNORMAL HIGH (ref 3.3–19.4)
Kappa, lambda light chain ratio: 2.5 — ABNORMAL HIGH (ref 0.26–1.65)
Lambda free light chains: 50.4 mg/L — ABNORMAL HIGH (ref 5.7–26.3)

## 2021-12-10 LAB — HAPTOGLOBIN: Haptoglobin: 238 mg/dL (ref 38–329)

## 2021-12-12 LAB — MULTIPLE MYELOMA PANEL, SERUM
Albumin SerPl Elph-Mcnc: 3 g/dL (ref 2.9–4.4)
Albumin/Glob SerPl: 1 (ref 0.7–1.7)
Alpha 1: 0.3 g/dL (ref 0.0–0.4)
Alpha2 Glob SerPl Elph-Mcnc: 0.9 g/dL (ref 0.4–1.0)
B-Globulin SerPl Elph-Mcnc: 0.8 g/dL (ref 0.7–1.3)
Gamma Glob SerPl Elph-Mcnc: 1.3 g/dL (ref 0.4–1.8)
Globulin, Total: 3.3 g/dL (ref 2.2–3.9)
IgA: 274 mg/dL (ref 61–437)
IgG (Immunoglobin G), Serum: 1319 mg/dL (ref 603–1613)
IgM (Immunoglobulin M), Srm: 65 mg/dL (ref 15–143)
Total Protein ELP: 6.3 g/dL (ref 6.0–8.5)

## 2021-12-14 ENCOUNTER — Encounter: Payer: Self-pay | Admitting: Oncology

## 2021-12-16 ENCOUNTER — Ambulatory Visit (INDEPENDENT_AMBULATORY_CARE_PROVIDER_SITE_OTHER): Payer: PPO

## 2021-12-16 VITALS — Ht 70.0 in | Wt 239.0 lb

## 2021-12-16 DIAGNOSIS — Z0000 Encounter for general adult medical examination without abnormal findings: Secondary | ICD-10-CM

## 2021-12-16 NOTE — Progress Notes (Signed)
Subjective:   [name] [name] is a 88 y.o. male who presents for Medicare Annual/Subsequent preventive examination.  Review of Systems    Virtual Visit via Telephone Note  I connected with  [name] [name] on 12/16/21 at  2:45 PM EDT by telephone and verified that I am speaking with the correct person using two identifiers.  Location: Patient: Home Provider: Office Persons participating in the virtual visit: patient/Nurse Health Advisor   I discussed the limitations, risks, security and privacy concerns of performing an evaluation and management service by telephone and the availability of in person appointments. The patient expressed understanding and agreed to proceed.  Interactive audio and video telecommunications were attempted between this nurse and patient, however failed, due to patient having technical difficulties OR patient did not have access to video capability.  We continued and completed visit with audio only.  Some vital signs may be absent or patient reported.   [name], LPN  Cardiac Risk Factors include: advanced age (>48mn, >>29women);hypertension;male gender     Objective:    Today's Vitals   12/16/21 1420  Weight: 239 lb (108.4 kg)  Height: _0  (1.778 m)   Body mass index is 34.29 kg/m.     12/16/2021    2:33 PM 12/09/2021    2:25 PM 11/02/2019    2:03 PM 03/26/2018    3:43 PM 12/03/2016    1:30 AM 12/02/2016    8:13 PM 11/14/2016   11:20 AM  Advanced Directives  Does Patient Have a Medical Advance Directive? _1  No No  Would patient like information on creating a medical advance directive? No - Patient declined No - Patient declined No - Patient declined No - Patient declined No - Patient declined      Current Medications (verified) Outpatient Encounter Medications as of 12/16/2021  Medication Sig   albuterol (PROAIR HFA) 108 (90 Base) MCG/ACT inhaler INHALE 1-2 PUFFS INTO THE LUNGS EVERY 6 (SIX) HOURS AS NEEDED FOR WHEEZING.  (Patient not taking: Reported on 12/09/2021)   aspirin EC 81 MG tablet Take 1 tablet (81 mg total) by mouth daily.   furosemide (LASIX) 40 MG tablet Take 1 tablet (40 mg total) by mouth daily.   Iron, Ferrous Sulfate, 325 (65 Fe) MG TABS Take 325 mg by mouth daily.   LORazepam (ATIVAN) 1 MG tablet TAKE 1/4 TO 1/2 TABLET BY MOUTH EVERY 6 HOURS AS NEEDED (Patient not taking: Reported on 12/09/2021)   OXYGEN Inhale into the lungs. 5 Liters   Respiratory Therapy Supplies (FLUTTER) DEVI Use as directed   SYMBICORT 160-4.5 MCG/ACT inhaler TAKE 2 PUFFS BY MOUTH TWICE A DAY   No facility-administered encounter medications on file as of 12/16/2021.    Allergies (verified) Iohexol, Atorvastatin, Celecoxib, Clinoril [sulindac], Nitrofurantoin, Spiriva handihaler [tiotropium bromide monohydrate], and Sulfa antibiotics   History: Past Medical History:  Diagnosis Date   [place]    a. 3.8 cm CTA 05/2015 b. 3.9 cm by [place] 11/2017   Anxiety    COPD [place]    on home O2 4LPM   Diverticulosis    Former smoker    quit 2001   Hemorrhoids    Kidney stones    passed spontaneously   OSA on CPAP    last study- in the home possibility- 2015, pt. unsure    Prediabetes    Hgb A1C 5.8 in Jan 2017   PUD (peptic ulcer  disease)    Right knee DJD 10/31/2011   Severe   S/P TAVR (transcatheter aortic valve replacement) 06/26/2015   26 mm Edwards Sapien 3 transcatheter heart valve placed via percutaneous right transfemoral approach   Severe aortic stenosis    a. Echo 11/16:  Mod LVH, EF 55-60%, no RWMA, Gr 1 DD, severe AS (mean 51 mmHg; peak 86 mmHg), MAC, mild LAE  b. s/p TAVR on 06/26/2015 with 11m Edward Sapien 3 THV   Shortness of breath dyspnea    Past Surgical History:  Procedure Laterality Date   CARDIAC CATHETERIZATION N/A 05/15/2015   Procedure: Right/Left Heart Cath and Coronary Angiography;  Surgeon: MSherren Mocha MD;  [facility];  Service: Cardiovascular;  Laterality: N/A;   CHOLECYSTECTOMY     EYE SURGERY     bilateral cataracts removed, /w IOL   KNEE SURGERY Right 1985   arthrosopic -    TEE WITHOUT CARDIOVERSION N/A 06/26/2015   Procedure: TRANSESOPHAGEAL ECHOCARDIOGRAM (TEE);  Surgeon: MSherren Mocha MD;  [place] [facility]  Service: Open Heart Surgery;  Laterality: N/A;   TONSILLECTOMY     TRANSCATHETER AORTIC VALVE REPLACEMENT, TRANSFEMORAL N/A 06/26/2015   Procedure: TRANSCATHETER AORTIC VALVE REPLACEMENT, TRANSFEMORAL;  Surgeon: MSherren Mocha MD;  [facility]  Service: Open Heart Surgery;  Laterality: N/A;   Family History  Problem Relation Age of Onset   Stroke Mother    Heart attack Mother 529      deceased   Prostate cancer Father 611  Prostate cancer Brother    Colon cancer Neg Hx    Social History   Socioeconomic History   Marital status: Married    Spouse name: Not on file   Number of children: 1   Years of education: Not on file   Highest education level: Not on file  Occupational History   Occupation: Retired    Comment: [employer] 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     Social Determinants of Health   Financial Resource Strain: Low Risk  (12/16/2021)   Overall Financial Resource Strain (CARDIA)    Difficulty of Paying Living Expenses: Not hard at all  Food Insecurity: No Food Insecurity (12/16/2021)   Hunger Vital Sign     Worried About Running Out of Food in the Last Year: Never true    Ran Out of Food in the Last Year: Never true  Transportation Needs: No Transportation Needs (12/16/2021)   PRAPARE - THydrologist(Medical): No    Lack of Transportation (Non-Medical): No  Physical Activity: Sufficiently Active (12/16/2021)   Exercise Vital Sign    Days of Exercise per Week: 3 days    Minutes of Exercise per Session: 60 min  Stress: No Stress Concern  Present (11/02/2019)   [place]    Feeling of Stress : Not at all  Social Connections: [place] (12/16/2021)   Social Connection and Isolation Panel [NHANES]    Frequency of Communication with Friends and Family: More than three times a week    Frequency of Social Gatherings with Friends and Family: More than three times a week    Attends Religious Services: More than 4 times per year    Active Member of [employer] or Organizations: Yes    Attends Music therapist: More than 4 times per year    Marital Status: Married    Tobacco Counseling Counseling given: Not Answered   Clinical Intake: How often do you need to have someone help you when you read instructions, pamphlets, or other written materials from your doctor or pharmacy?: 3 - Sometimes  Diabetic?  No   Activities of Daily Living    12/16/2021    2:28 PM  In your present state of health, do you have any difficulty performing the following activities:  Hearing? 0  Vision? 0  Difficulty concentrating or making decisions? 0  Walking or climbing stairs? 1  Comment Uses a w/c, cane and walker  Dressing or bathing? 0  Doing errands, shopping? [place] and eating ? N  Using the Toilet? N  In the past six months, have you accidently leaked urine? Y  Comment Wear breifs. Followed by PCP  Do you have problems with loss of bowel control? N  Managing your  Medications? N  Managing your Finances? Y  Comment Wife assist  Housekeeping or managing your Housekeeping? Y  Comment Wife assist    Patient Care Team: Tonia Ghent, MD as PCP - General (Family Medicine) Vickie Epley, MD as PCP - Electrophysiology (Cardiology) End, Harrell Gave, MD as PCP - Cardiology (Cardiology) Noralee Space, MD as Consulting Physician (Pulmonary Disease) Nahser, Wonda Cheng, MD as Consulting Physician (Cardiology) Charlton Haws, [facility] as Pharmacist (Pharmacist)  Indicate any recent Medical Services you may have received from other than [facility] providers in the past year (date may be approximate).     Assessment:   This is a routine wellness examination for O[place].  Hearing/Vision screen Hearing Screening - Comments:: No hearing difficulty Vision Screening - Comments:: Wears glasses. Followed by [place] issues and exercise activities discussed: Exercise limited by: None identified   Goals Addressed               This Visit's Progress     Stay healthy (pt-stated)         Depression Screen    12/16/2021    2:25 PM 06/13/2021   12:25 PM 11/02/2019    2:05 PM 03/26/2018    3:42 PM 11/14/2016   12:33 PM 12/27/2015   10:50 AM 09/14/2015   11:28 AM  PHQ 2/9 Scores  PHQ - 2 Score 0 0 0 0 2 0 0  PHQ- 9 Score   0 0 7      Fall Risk    12/16/2021    2:32 PM 06/13/2021   12:25 PM 11/02/2019    2:04 PM 05/11/2019    5:18 PM 03/26/2018    3:42 PM  Fall Risk   Falls in the past year? 0 0 1 1 No  Comment    Emmi Telephone Survey: data to providers prior to load   Number falls  in past yr: 0 0 1 1   Comment    Emmi Telephone Survey Actual Response = 1   Injury with Fall? 0 0 0 1   Risk for fall due to : No Fall Risks History of fall(s);Impaired mobility Medication side effect;Impaired balance/gait;History of fall(s)    Follow up  Falls evaluation completed Falls evaluation completed;Falls prevention discussed      FALL RISK  PREVENTION PERTAINING TO THE HOME:  Any stairs in or around the home? Yes  If so, are there any without handrails? No  Home free of loose throw rugs in walkways, pet beds, electrical cords, etc? Yes  Adequate lighting in your home to reduce risk of falls? Yes   ASSISTIVE DEVICES UTILIZED TO PREVENT FALLS:  Life alert? No  Use of a cane, walker or w/c? Yes  Grab bars in the bathroom? No  Shower chair or bench in shower? Yes  Elevated toilet seat or a handicapped toilet? Yes   TIMED UP AND GO:  Was the test performed? Yes . Audio Visit  Cognitive Function:    11/02/2019    2:07 PM 03/26/2018    3:42 PM 11/14/2016   11:12 AM 09/14/2015   11:14 AM  MMSE - Mini Mental State Exam  Orientation to time _0 Orientation to Place _1 Registration _2 Attention/ Calculation 5 0 0 0  Recall _3 Recall-comments   pt was unable to recall 1 of 3 words   Language- name 2 objects  0 0 0  Language- repeat _4 Language- follow 3 step command  _5 Language- read & follow direction  0 0 0  Write a sentence  0 0 0  Copy design  0 0 0  Total score  _6 12/16/2021    2:33 PM  6CIT Screen  What Year? 0 points  What month? 0 points  What time? 0 points  Count back from 20 0 points  Months in reverse 0 points  Repeat phrase 2 points  Total Score 2 points    Immunizations Immunization History  Administered Date(s) Administered   Influenza Split 06/17/2011, 02/23/2012   Influenza Whole 04/27/2006, 04/10/2009, 04/10/2010   Influenza, Seasonal, Injecte, Preservative Fre 06/13/2016   Influenza,inj,Quad PF,6+ Mos 03/17/2013, 04/24/2014, 03/17/2015, 07/15/2016, 03/26/2018   PFIZER(Purple Top)SARS-COV-2 Vaccination 07/18/2019, 08/08/2019, 05/16/2020   Pneumococcal Polysaccharide-23 02/15/1999, 10/20/2011   Td 02/05/2006, 01/08/2017   Zoster, Live 01/15/2009    TDAP status: Up to date  Flu Vaccine status: Declined, Education has been provided  regarding the importance of this vaccine but patient still declined. Advised may receive this vaccine at local pharmacy or Health Dept. Aware to provide a copy of the vaccination record if obtained from local pharmacy or Health Dept. Verbalized acceptance and understanding.  Pneumococcal vaccine status: Due, Education has been provided regarding the importance of this vaccine. Advised may receive this vaccine at local pharmacy or Health Dept. Aware to provide a copy of the vaccination record if obtained from local pharmacy or Health Dept. Verbalized acceptance and understanding.  Covid-19 vaccine status: Completed vaccines  Qualifies for Shingles Vaccine? Yes   Zostavax completed No   Shingrix Completed?: No.    Education has been provided regarding the importance of this vaccine. Patient has been advised to call insurance company to determine out of pocket expense if  they have not yet received this vaccine. Advised may also receive vaccine at local pharmacy or Health Dept. Verbalized acceptance and understanding.  Screening Tests Health Maintenance  Topic Date Due   COVID-19 Vaccine (4 - Booster for Pfizer series) 01/01/2022 (Originally 07/11/2020)   Zoster Vaccines- Shingrix (1 of 2) 03/18/2022 (Originally 09/05/1952)   Pneumonia Vaccine 28+ Years old (2 - PCV) 12/17/2022 (Originally 10/19/2012)   COLONOSCOPY (Pts 45-77yr Insurance coverage will need to be confirmed)  11/14/2025 (Originally 03/22/2012)   INFLUENZA VACCINE  01/14/2022   TETANUS/TDAP  01/09/2027   HPV VACCINES  Aged Out    Health Maintenance  There are no preventive care reminders to display for this patient.   Colorectal cancer screening: No longer required.   Lung Cancer Screening: (Low Dose CT Chest recommended if Age 86-80years, 30 pack-year currently smoking OR have quit w/in 15years.) does not qualify.     Additional Screening:  Hepatitis C Screening: does not qualify; Completed   Vision Screening: Recommended  annual ophthalmology exams for early detection of glaucoma and other disorders of the eye. Is the patient up to date with their annual eye exam?  Yes  Who is the provider or what is the name of the office in which the patient attends annual eye exams? [place] not established with a provider, would they like to be referred to a provider to establish care? No .   Dental Screening: Recommended annual dental exams for proper oral hygiene  Community Resource Referral / Chronic Care Management:  CRR required this visit?  No   CCM required this visit?  No      Plan:     I have personally reviewed and noted the following in the patient's chart:   Medical and social history Use of alcohol, tobacco or illicit drugs  Current medications and supplements including opioid prescriptions. Patient is not currently taking opioid prescriptions. Functional ability and status Nutritional status Physical activity Advanced directives List of other physicians Hospitalizations, surgeries, and ER visits in previous 12 months Vitals Screenings to include cognitive, depression, and falls Referrals and appointments  In addition, I have reviewed and discussed with patient certain preventive protocols, quality metrics, and best practice recommendations. A written personalized care plan for preventive services as well as general preventive health recommendations were provided to patient.     BCriselda Peaches LPN   [id number]/[id number]  Nurse Notes: None

## 2021-12-16 NOTE — Patient Instructions (Signed)
[name] , Thank you for taking time to come for your Medicare Wellness Visit. I appreciate your ongoing commitment to your health goals. Please review the following plan we discussed and let me know if I can assist you in the future.   These are the goals we discussed:  Goals      Increase physical activity     Starting 03/26/2018, I will continue to do arm and leg exercises for 30 min 4-5 days per week.      Patient Stated     11/02/2019, I will maintain and continue medications as prescribed.      Track and Manage My Blood Pressure-Hypertension     Timeframe:  Long-Range Goal Priority:  Medium Start Date:       07/08/21                      Expected End Date:   07/08/22                    Follow Up Date July 2023   - check blood pressure weekly - choose a place to take my blood pressure (home, clinic or office, retail store) - write blood pressure results in a log or diary    Why is this important?   You won't feel high blood pressure, but it can still hurt your blood vessels.  High blood pressure can cause heart or kidney problems. It can also cause a stroke.  Making lifestyle changes like losing a little weight or eating less salt will help.  Checking your blood pressure at home and at different times of the day can help to control blood pressure.  If the doctor prescribes medicine remember to take it the way the doctor ordered.  Call the office if you cannot afford the medicine or if there are questions about it.     Notes:         This is a list of the screening recommended for you and due dates:  Health Maintenance  Topic Date Due   Zoster (Shingles) Vaccine (1 of 2) Never done   Pneumonia Vaccine (2 - PCV) 10/19/2012   COVID-19 Vaccine (4 - Booster for Pfizer series) 07/11/2020   Colon Cancer Screening  11/14/2025*   Flu Shot  01/14/2022   Tetanus Vaccine  01/09/2027   HPV Vaccine  Aged Out  *Topic was postponed. The date shown is not the original due date.     Advanced directives: No  Conditions/risks identified: None  Next appointment: Follow up in one year for your annual wellness visit.    Preventive Care 86 Years and Older, Male Preventive care refers to lifestyle choices and visits with your health care provider that can promote health and wellness. What does preventive care include? A yearly physical exam. This is also called an annual well check. Dental exams once or twice a year. Routine eye exams. Ask your health care provider how often you should have your eyes checked. Personal lifestyle choices, including: Daily care of your teeth and gums. Regular physical activity. Eating a healthy diet. Avoiding tobacco and drug use. Limiting alcohol use. Practicing safe sex. Taking low doses of aspirin every day. Taking vitamin and mineral supplements as recommended by your health care provider. What happens during an annual well check? The services and screenings done by your health care provider during your annual well check will depend on your age, overall health, lifestyle risk factors, and family history  of disease. Counseling  Your health care provider may ask you questions about your: Alcohol use. Tobacco use. Drug use. Emotional well-being. Home and relationship well-being. Sexual activity. Eating habits. History of falls. Memory and ability to understand (cognition). Work and work Statistician. Screening  You may have the following tests or measurements: Height, weight, and BMI. Blood pressure. Lipid and cholesterol levels. These may be checked every 5 years, or more frequently if you are over 86 years old. Skin check. Lung cancer screening. You may have this screening every year starting at age 86 if you have a 30-pack-year history of smoking and currently smoke or have quit within the past 15 years. Fecal occult blood test (FOBT) of the stool. You may have this test every year starting at age 86. Flexible  sigmoidoscopy or colonoscopy. You may have a sigmoidoscopy every 5 years or a colonoscopy every 10 years starting at age 86. Prostate cancer screening. Recommendations will vary depending on your family history and other risks. Hepatitis C blood test. Hepatitis B blood test. Sexually transmitted disease (STD) testing. Diabetes screening. 86 is done by checking your blood sugar (glucose) after you have not eaten for a while (fasting). You may have this done every 1-3 years. Abdominal aortic aneurysm (AAA) screening. You may need this if you are a current or former smoker. Osteoporosis. You may be screened starting at age 86 if you are at high risk. Talk with your health care provider about your test results, treatment options, and if necessary, the need for more tests. Vaccines  Your health care provider may recommend certain vaccines, such as: Influenza vaccine. This is recommended every year. Tetanus, diphtheria, and acellular pertussis (Tdap, Td) vaccine. You may need a Td booster every 10 years. Zoster vaccine. You may need this after age 16. Pneumococcal 13-valent conjugate (PCV13) vaccine. One dose is recommended after age 86. Pneumococcal polysaccharide (PPSV23) vaccine. One dose is recommended after age 86. Talk to your health care provider about which screenings and vaccines you need and how often you need them. This information is not intended to replace advice given to you by your health care provider. Make sure you discuss any questions you have with your health care provider. Document Released: 06/29/2015 Document Revised: 02/20/2016 Document Reviewed: 04/03/2015 Elsevier Interactive Patient Education  2017 [employer] Prevention in the Home Falls can cause injuries. They can happen to people of all ages. There are many things you can do to make your home safe and to help prevent falls. What can I do on the outside of my home? Regularly fix the edges of walkways and  driveways and fix any cracks. Remove anything that might make you trip as you walk through a door, such as a raised step or threshold. Trim any bushes or trees on the path to your home. Use bright outdoor lighting. Clear any walking paths of anything that might make someone trip, such as rocks or tools. Regularly check to see if handrails are loose or broken. Make sure that both sides of any steps have handrails. Any raised decks and porches should have guardrails on the edges. Have any leaves, snow, or ice cleared regularly. Use sand or salt on walking paths during winter. Clean up any spills in your garage right away. This includes oil or grease spills. What can I do in the bathroom? Use night lights. Install grab bars by the toilet and in the tub and shower. Do not use towel bars as grab bars. Use  non-skid mats or decals in the tub or shower. If you need to sit down in the shower, use a plastic, non-slip stool. Keep the floor dry. Clean up any water that spills on the floor as soon as it happens. Remove soap buildup in the tub or shower regularly. Attach bath mats securely with double-sided non-slip rug tape. Do not have throw rugs and other things on the floor that can make you trip. What can I do in the bedroom? Use night lights. Make sure that you have a light by your bed that is easy to reach. Do not use any sheets or blankets that are too big for your bed. They should not hang down onto the floor. Have a firm chair that has side arms. You can use this for support while you get dressed. Do not have throw rugs and other things on the floor that can make you trip. What can I do in the kitchen? Clean up any spills right away. Avoid walking on wet floors. Keep items that you use a lot in easy-to-reach places. If you need to reach something above you, use a strong step stool that has a grab bar. Keep electrical cords out of the way. Do not use floor polish or wax that makes floors  slippery. If you must use wax, use non-skid floor wax. Do not have throw rugs and other things on the floor that can make you trip. What can I do with my stairs? Do not leave any items on the stairs. Make sure that there are handrails on both sides of the stairs and use them. Fix handrails that are broken or loose. Make sure that handrails are as long as the stairways. Check any carpeting to make sure that it is firmly attached to the stairs. Fix any carpet that is loose or worn. Avoid having throw rugs at the top or bottom of the stairs. If you do have throw rugs, attach them to the floor with carpet tape. Make sure that you have a light switch at the top of the stairs and the bottom of the stairs. If you do not have them, ask someone to add them for you. What else can I do to help prevent falls? Wear shoes that: Do not have high heels. Have rubber bottoms. Are comfortable and fit you well. Are closed at the toe. Do not wear sandals. If you use a stepladder: Make sure that it is fully opened. Do not climb a closed stepladder. Make sure that both sides of the stepladder are locked into place. Ask someone to hold it for you, if possible. Clearly mark and make sure that you can see: Any grab bars or handrails. First and last steps. Where the edge of each step is. Use tools that help you move around (mobility aids) if they are needed. These include: Canes. Walkers. Scooters. Crutches. Turn on the lights when you go into a dark area. Replace any light bulbs as soon as they burn out. Set up your furniture so you have a clear path. Avoid moving your furniture around. If any of your floors are uneven, fix them. If there are any pets around you, be aware of where they are. Review your medicines with your doctor. Some medicines can make you feel dizzy. This can increase your chance of falling. Ask your doctor what other things that you can do to help prevent falls. This information is not  intended to replace advice given to you by your health care  provider. Make sure you discuss any questions you have with your health care provider. Document Released: 03/29/2009 Document Revised: 11/08/2015 Document Reviewed: 07/07/2014 Elsevier Interactive Patient Education  2017 [name].

## 2021-12-20 DIAGNOSIS — L03116 Cellulitis of left lower limb: Secondary | ICD-10-CM | POA: Diagnosis not present

## 2021-12-20 DIAGNOSIS — L03115 Cellulitis of right lower limb: Secondary | ICD-10-CM | POA: Diagnosis not present

## 2021-12-23 ENCOUNTER — Inpatient Hospital Stay: Payer: PPO | Attending: Oncology | Admitting: Oncology

## 2021-12-23 DIAGNOSIS — E538 Deficiency of other specified B group vitamins: Secondary | ICD-10-CM

## 2021-12-23 DIAGNOSIS — D649 Anemia, unspecified: Secondary | ICD-10-CM | POA: Diagnosis not present

## 2021-12-23 MED ORDER — CYANOCOBALAMIN 1000 MCG/ML IJ SOLN: 1000.0000 ug | INTRAMUSCULAR | 0 refills | Status: DC

## 2021-12-23 MED ORDER — "SYRINGE 25G X 1"" 3 ML MISC": 1.0000 mL | 1 refills | Status: DC

## 2021-12-23 NOTE — Progress Notes (Signed)
I connected with [name] [name] on 12/23/21 at  3:15 PM EDT by video enabled telemedicine visit and verified that I am speaking with the correct person using two identifiers.   I discussed the limitations, risks, security and privacy concerns of performing an evaluation and management service by telemedicine and the availability of in-person appointments. I also discussed with the patient that there may be a patient responsible charge related to this service. The patient expressed understanding and agreed to proceed.  Other persons participating in the visit and their role in the encounter: Patient's daughter  Patient's location:  home Provider's location:  work  Risk analyst Complaint: Discuss results of blood work  History of present illness: Patient is a 88 year old male with a past medical history significant for chronic heart failure with preserved EF, obstructive sleep apnea, chronic respiratory failure, COPD, hypertension diabetes BPH among other medical problems.  He has been referred to [place] for anemia.Most recent CBC from 11/26/2021 showed H&H of 8.2/25.4 with an MCV of 95.4.  White count and platelet count were normal.  Serum iron was normal at 66.  Looking back at his prior CBCs his hemoglobin at baseline is between 12-13.  He had drifted down to 10.7 in August 2022 and 8.6 since May 2023.  Baseline creatinine in May 2023 was normal.  Results of blood work from 12/09/2021 were as follows: CBC showed an H&H of 8.8/29.1 with a normal white count and platelet count.  B12 levels were mildly low at 275.  Folate was normal.  Haptoglobin normal.  Myeloma panel showed no M protein.  Both serum kappa and lambda light chains were elevated with a mildly abnormal free light chain ratio of 2.5.  LDH normal.  Interval history patient has baseline fatigue and exertional shortness of breath which is essentially unchanged.  Denies other complaints at this time   Review of Systems  Constitutional:  Positive for  malaise/fatigue. Negative for chills, fever and weight loss.  HENT:  Negative for congestion, ear discharge and nosebleeds.   Eyes:  Negative for blurred vision.  Respiratory:  Positive for shortness of breath. Negative for cough, hemoptysis, sputum production and wheezing.   Cardiovascular:  Negative for chest pain, palpitations, orthopnea and claudication.  Gastrointestinal:  Negative for abdominal pain, blood in stool, constipation, diarrhea, heartburn, melena, nausea and vomiting.  Genitourinary:  Negative for dysuria, flank pain, frequency, hematuria and urgency.  Musculoskeletal:  Negative for back pain, joint pain and myalgias.  Skin:  Negative for rash.  Neurological:  Negative for dizziness, tingling, focal weakness, seizures, weakness and headaches.  Endo/Heme/Allergies:  Does not bruise/bleed easily.  Psychiatric/Behavioral:  Negative for depression and suicidal ideas. The patient does not have insomnia.     Allergies  Allergen Reactions   Iohexol Hives and Swelling    Pt reports swelling, redness, hives, and blisters    Atorvastatin     REACTION: aches   Celecoxib     REACTION: rash   Clinoril [Sulindac]     REACTION: rash   Nitrofurantoin    Spiriva Handihaler [Tiotropium Bromide Monohydrate] Other (See Comments)    Dry mouth   Sulfa Antibiotics     Intolerant but unrecalled.     Past Medical History:  Diagnosis Date   AAA (abdominal aortic aneurysm) ([place])    a. 3.8 cm CTA 05/2015 b. 3.9 cm by [place] in 11/2017   Anxiety    COPD (chronic obstructive pulmonary disease) ([place])    on home O2 4LPM   Diverticulosis  Former smoker    quit 2001   Hemorrhoids    Kidney stones    passed spontaneously   OSA on CPAP    last study- in the home possibility- 2015, pt. unsure    Prediabetes    Hgb A1C 5.8 in Jan 2017   PUD (peptic ulcer disease)    Right knee DJD 10/31/2011   Severe   S/P TAVR (transcatheter aortic valve replacement) 06/26/2015   26 mm Edwards Sapien 3  transcatheter heart valve placed via percutaneous right transfemoral approach   Severe aortic stenosis    a. Echo 11/16:  Mod LVH, EF 55-60%, no RWMA, Gr 1 DD, severe AS (mean 51 mmHg; peak 86 mmHg), MAC, mild LAE  b. s/p TAVR on 06/26/2015 with 78m Edward Sapien 3 THV   Shortness of breath dyspnea     Past Surgical History:  Procedure Laterality Date   CARDIAC CATHETERIZATION N/A 05/15/2015   Procedure: Right/Left Heart Cath and Coronary Angiography;  Surgeon: MSherren Mocha MD;  [facility];  Service: Cardiovascular;  Laterality: N/A;   CHOLECYSTECTOMY     EYE SURGERY     bilateral cataracts removed, /w IOL   KNEE SURGERY Right 1985   arthrosopic -    TEE WITHOUT CARDIOVERSION N/A 06/26/2015   Procedure: TRANSESOPHAGEAL ECHOCARDIOGRAM (TEE);  Surgeon: MSherren Mocha MD;  [facility]  Service: Open Heart Surgery;  Laterality: N/A;   TONSILLECTOMY     TRANSCATHETER AORTIC VALVE REPLACEMENT, TRANSFEMORAL N/A 06/26/2015   Procedure: TRANSCATHETER AORTIC VALVE REPLACEMENT, TRANSFEMORAL;  Surgeon: MSherren Mocha MD;  Location: [facility]  Service: Open Heart Surgery;  Laterality: N/A;    Social History   Socioeconomic History   Marital status: Married    Spouse name: Not on file   Number of children: 1   Years of education: Not on file   Highest education level: Not on file  Occupational History   Occupation: Retired    Comment: [employer] 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     Social Determinants of Health   Financial Resource Strain: Low Risk  (12/16/2021)   Overall Financial Resource Strain (CARDIA)    Difficulty of Paying Living Expenses: Not hard at all  Food Insecurity: No Food Insecurity (12/16/2021)   Hunger Vital Sign    Worried About Running Out of Food in the Last Year: Never true    Ran Out of Food in the Last Year: Never true  Transportation Needs: No Transportation Needs (12/16/2021)   PRAPARE - THydrologist(Medical): No    Lack of Transportation (Non-Medical): No  Physical Activity: Sufficiently Active (12/16/2021)   Exercise Vital Sign    Days of Exercise per Week: 3 days    Minutes of  Exercise per Session: 60 min  Stress: No Stress Concern Present (11/02/2019)   [place]    Feeling of Stress : Not at all  Social Connections: [place] (12/16/2021)   Social Connection and Isolation Panel [NHANES]    Frequency of Communication with Friends and Family: More than three times a week    Frequency of Social Gatherings with Friends and Family: More than three times a week    Attends Religious Services: More than 4 times per year    Active Member of [employer] or Organizations: Yes    Attends Music therapist: More than 4 times per year    Marital Status: Married  Human resources officer Violence: Not At Risk (12/16/2021)   Humiliation, Afraid, Rape, and Kick questionnaire    Fear of Current or Ex-Partner: No    Emotionally Abused: No    Physically Abused: No    Sexually Abused: No    Family History  Problem Relation Age of Onset   Stroke Mother    Heart attack Mother 43       deceased   Prostate cancer Father 99   Prostate cancer Brother    Colon cancer Neg Hx      Current Outpatient Medications:    albuterol (PROAIR HFA) 108 (90 Base) MCG/ACT inhaler, INHALE 1-2 PUFFS INTO THE  LUNGS EVERY 6 (SIX) HOURS AS NEEDED FOR WHEEZING. (Patient not taking: Reported on 12/09/2021), Disp: 8.5 g, Rfl: 5   aspirin EC 81 MG tablet, Take 1 tablet (81 mg total) by mouth daily., Disp: , Rfl:    cephALEXin (KEFLEX) 500 MG capsule, Take 500 mg by mouth 2 (two) times daily., Disp: , Rfl:    furosemide (LASIX) 40 MG tablet, Take 1 tablet (40 mg total) by mouth daily., Disp: 90 tablet, Rfl: 3   Iron, Ferrous Sulfate, 325 (65 Fe) MG TABS, Take 325 mg by mouth daily., Disp: , Rfl:    LORazepam (ATIVAN) 1 MG tablet, TAKE 1/4 TO 1/2 TABLET BY MOUTH EVERY 6 HOURS AS NEEDED (Patient not taking: Reported on 12/09/2021), Disp: 30 tablet, Rfl: 1   OXYGEN, Inhale into the lungs. 5 Liters, Disp: , Rfl:    Respiratory Therapy Supplies (FLUTTER) DEVI, Use as directed, Disp: 1 each, Rfl: 0   SYMBICORT 160-4.5 MCG/ACT inhaler, TAKE 2 PUFFS BY MOUTH TWICE A DAY, Disp: 30.6 each, Rfl: 3  No results found.  No images are attached to the encounter.      Latest Ref Rng & Units 12/09/2021    3:14 PM  CMP  Glucose 70 - 99 mg/dL 115   BUN 8 - 23 mg/dL 25   Creatinine 0.61 - 1.24 mg/dL 1.22   Sodium 135 - 145 mmol/L 140   Potassium 3.5 - 5.1 mmol/L 3.7   Chloride 98 - 111 mmol/L 94   CO2 22 - 32 mmol/L 38   Calcium 8.9 - 10.3 mg/dL 8.4   Total Protein 6.5 - 8.1 g/dL 6.8   Total Bilirubin 0.3 - 1.2 mg/dL 0.7   Alkaline Phos 38 - 126 U/L 107   AST 15 - 41 U/L 22   ALT 0 - 44 U/L 12       Latest Ref Rng & Units 12/09/2021    3:14 PM  CBC  WBC 4.0 - 10.5 K/uL 6.8   Hemoglobin 13.0 - 17.0 g/dL 8.8   Hematocrit 39.0 - 52.0 % 29.1   Platelets 150 - 400 K/uL  222      Observation/objective: Chronic oxygen dependent COPD.  Appears in no acute distress  Assessment and plan: Patient is a 88 year old male referred for normocytic anemia  Patient's hemoglobin is slowly drifted down from 12 in 20 21-10 in 2022 and presently at 8.8.  Results of peripheral blood anemia work-up showed mild B12 deficiency of  275 with borderline macrocytosis with an MCV of 100 ferritin and iron studies, hemolysis work-up myeloma work-up are fairly unremarkable.  Although both kappa and lambda free light chains are elevated the ratio is mildly abnormal at 2.5.  I would like him to take B12 injection once a week for 4 weeks and I will see him back in 2 months with a CBC with differential.  If hemoglobin fails to improve despite B12 injection he will need a bone marrow biopsy for further assessment  Follow-up instructions: As above  I discussed the assessment and treatment plan with the patient. The patient was provided an opportunity to ask questions and all were answered. The patient agreed with the plan and demonstrated an understanding of the instructions.   The patient was advised to call back or seek an in-person evaluation if the symptoms worsen or if the condition fails to improve as anticipated.  I provided 20 minutes of face-to-face video visit time during this encounter. Time was spent in reviewing patients labs, coming up with a future care plan and face to face visit  Visit Diagnosis: 1. Normocytic anemia   2. B12 deficiency     Dr. Randa Evens, MD, MPH [facility] at [facility] Tel- [phone] 12/23/2021 3:52 PM

## 2021-12-27 ENCOUNTER — Telehealth: Payer: Self-pay

## 2021-12-27 ENCOUNTER — Encounter: Payer: Self-pay | Admitting: Family Medicine

## 2021-12-27 ENCOUNTER — Ambulatory Visit (INDEPENDENT_AMBULATORY_CARE_PROVIDER_SITE_OTHER): Payer: PPO | Admitting: Family Medicine

## 2021-12-27 DIAGNOSIS — L089 Local infection of the skin and subcutaneous tissue, unspecified: Secondary | ICD-10-CM

## 2021-12-27 MED ORDER — CURITY UNNA BOOT EX MISC
CUTANEOUS | 2 refills | Status: DC
Start: 2021-12-27 — End: 2022-08-01

## 2021-12-27 NOTE — Telephone Encounter (Signed)
Spoke to pt's daughter. She will let us know on Monday if they were not able to get it.

## 2021-12-27 NOTE — Telephone Encounter (Signed)
Patient's daughter called in wondering if Dr.Duncan was able to get her dad setup for an IT trainer. Asking for a call in return.

## 2021-12-27 NOTE — Chronic Care Management (AMB) (Unsigned)
    Chronic Care Management Pharmacy Assistant   Name: [name] [name]  MRN: [id number] DOB: 05-Jan-1934   Reason for Encounter: Reminder Call   Conditions to be addressed/monitored: HTN and COPD   Medications: Outpatient Encounter Medications as of 12/27/2021  Medication Sig Note   albuterol (PROAIR HFA) 108 (90 Base) MCG/ACT inhaler INHALE 1-2 PUFFS INTO THE LUNGS EVERY 6 (SIX) HOURS AS NEEDED FOR WHEEZING.    aspirin EC 81 MG tablet Take 1 tablet (81 mg total) by mouth daily. 05/23/2015: .   cyanocobalamin (,VITAMIN B-12,) 1000 MCG/ML injection Inject 1 mL (1,000 mcg total) into the muscle once a week.    furosemide (LASIX) 40 MG tablet Take 1 tablet (40 mg total) by mouth daily.    Iron, Ferrous Sulfate, 325 (65 Fe) MG TABS Take 325 mg by mouth daily.    LORazepam (ATIVAN) 1 MG tablet TAKE 1/4 TO 1/2 TABLET BY MOUTH EVERY 6 HOURS AS NEEDED    OXYGEN Inhale into the lungs. 5 Liters    Respiratory Therapy Supplies (FLUTTER) DEVI Use as directed    SYMBICORT 160-4.5 MCG/ACT inhaler TAKE 2 PUFFS BY MOUTH TWICE A DAY    Syringe/Needle, Disp, (SYRINGE 3CC/25GX1") 25G X 1" 3 ML MISC 1 mL by Does not apply route once a week.    No facility-administered encounter medications on file as of 12/27/2021.   [name] was contacted to remind of upcoming telephone visit with Charlene Brooke  on 01/01/22 at 11:00am. Patient was reminded to have any blood glucose and blood pressure readings available for review at appointment.   {Appointment confirmation:27274}   Are you having any problems with your medications? {yes/no:20286}   Do you have any concerns you like to discuss with the pharmacist? {yes/no:20286}   CCM referral has been placed prior to visit?  No    Star Rating Drugs: Medication:  Last Fill: Day Supply No star medications identified  Furosemide '40mg'$  10/24/21 [place], CPP notified  Avel Sensor, [place]  [phone]

## 2021-12-27 NOTE — Telephone Encounter (Signed)
Yes, sent the rx.  Thanks.  Please let her know.

## 2021-12-27 NOTE — Progress Notes (Addendum)
He has been taking '40mg'$  lasix daily.  History of chronic respiratory failure, in wheelchair, using O2, at baseline.  Done with keflex as of today.  No change in leg symptoms while on abx.  R>L BLE edema.  Weeping.  No fevers.  Legs don't feel hot.  Family has been wrapping his legs with Coban after applying Neosporin.  They have been doing this for an extended period time  Meds, vitals, and allergies reviewed.   ROS: Per HPI unless specifically indicated in ROS section   Nad Ncat In wheelchair On O2 via nasal cannula at baseline. Rrr Ctab Abd soft, not ttp No lower extremity edema but he does have a small amount of weeping fluid from the legs.  He likely does not have edema because his legs have recently been wrapped in Coban.  No open ulceration on the feet but he has diffuse pinkish changes on the lower extremities from the shin distally without any fluctuant mass.

## 2021-12-27 NOTE — Patient Instructions (Signed)
Try to keep the unna boot on for a few days.  Take off if soaked through, if you have a fever, or if more pain.   Let me know how the skin looks when you take it off.   Take care.  Glad to see you.

## 2021-12-29 DIAGNOSIS — L089 Local infection of the skin and subcutaneous tissue, unspecified: Secondary | ICD-10-CM | POA: Insufficient documentation

## 2021-12-29 NOTE — Assessment & Plan Note (Signed)
It looks like he has chronic skin inflammation on the lower extremities and we talked about options.  I think it is reasonable to try an Haematologist.  Consent obtained and I put an Unna boot on both legs from the toes up to the upper shin per routine and this was tolerated well.  Bandage felt better on his legs.  We talked about options.  If he is having weeping that is soaking through the bandage they may need to change them in the meantime.  Daughter saw me apply the bandages she can do this at home if needed.  Prescription sent.  They can update me about his legs in a few days.  It does not appear that his legs are infected and the Unna boot may help with skin irritation bilaterally.  Would continue Lasix as is.  30 minutes were devoted to patient care in this encounter (this includes time spent reviewing the patient's file/history, interviewing and examining the patient, counseling/reviewing plan with patient).

## 2021-12-30 DIAGNOSIS — Z7982 Long term (current) use of aspirin: Secondary | ICD-10-CM | POA: Diagnosis not present

## 2021-12-30 DIAGNOSIS — I739 Peripheral vascular disease, unspecified: Secondary | ICD-10-CM | POA: Diagnosis not present

## 2021-12-30 NOTE — Telephone Encounter (Signed)
If they can change the bandages then would leave on for about 5 days, change sooner if needed. Please update me at that bandage change, sooner if needed.  Thanks.

## 2021-12-30 NOTE — Telephone Encounter (Signed)
Spoke to pt's daughter, Jackelyn Poling. Michela Pitcher they have been able to leave the wrap that was applied on Friday. Got Geographical information systems officer at a medical supply store over the weekend. Said drainage is a lot less, sleeping much better, itching and pain is better. She plans t0 change the bandage tomorrow as she is going out if town  the next day.

## 2021-12-30 NOTE — Telephone Encounter (Signed)
Spoke with Hilda Blades. Tomorrow will be day 5 which is when she is planning to change the bandage. She will change it in another 5 days when she returns from out of town. She will send a mychart message tomorrow when she changes the bandages with an update on how his feet look.

## 2021-12-31 ENCOUNTER — Encounter: Payer: Self-pay | Admitting: Family Medicine

## 2021-12-31 ENCOUNTER — Telehealth: Payer: PPO | Admitting: Oncology

## 2022-01-01 ENCOUNTER — Telehealth: Payer: PPO

## 2022-01-01 DIAGNOSIS — I8390 Asymptomatic varicose veins of unspecified lower extremity: Secondary | ICD-10-CM | POA: Diagnosis not present

## 2022-01-01 DIAGNOSIS — Z6833 Body mass index (BMI) 33.0-33.9, adult: Secondary | ICD-10-CM | POA: Diagnosis not present

## 2022-01-01 DIAGNOSIS — J9610 Chronic respiratory failure, unspecified whether with hypoxia or hypercapnia: Secondary | ICD-10-CM | POA: Diagnosis not present

## 2022-01-01 DIAGNOSIS — I872 Venous insufficiency (chronic) (peripheral): Secondary | ICD-10-CM | POA: Diagnosis not present

## 2022-01-01 DIAGNOSIS — G4733 Obstructive sleep apnea (adult) (pediatric): Secondary | ICD-10-CM | POA: Diagnosis not present

## 2022-01-01 DIAGNOSIS — J449 Chronic obstructive pulmonary disease, unspecified: Secondary | ICD-10-CM | POA: Diagnosis not present

## 2022-01-01 DIAGNOSIS — I10 Essential (primary) hypertension: Secondary | ICD-10-CM | POA: Diagnosis not present

## 2022-01-01 DIAGNOSIS — Z515 Encounter for palliative care: Secondary | ICD-10-CM | POA: Diagnosis not present

## 2022-01-01 DIAGNOSIS — I739 Peripheral vascular disease, unspecified: Secondary | ICD-10-CM | POA: Diagnosis not present

## 2022-01-01 DIAGNOSIS — Z7951 Long term (current) use of inhaled steroids: Secondary | ICD-10-CM | POA: Diagnosis not present

## 2022-01-01 DIAGNOSIS — D692 Other nonthrombocytopenic purpura: Secondary | ICD-10-CM | POA: Diagnosis not present

## 2022-01-01 DIAGNOSIS — L89152 Pressure ulcer of sacral region, stage 2: Secondary | ICD-10-CM | POA: Diagnosis not present

## 2022-01-01 NOTE — Telephone Encounter (Signed)
Spoke with patient, he affirms compliance with medications as prescribed and denies issues/side effects. Cancelled CCM appt as it is not necessary.

## 2022-01-01 NOTE — Progress Notes (Deleted)
Chronic Care Management Pharmacy Note  01/01/2022 Name:  [name] [name] MRN:  [id number] DOB:  Jul 21, 1933  Summary: CCM F/U visit -Pt endorses compliance with medications as prescribed -Pt-reported BP at home is at goal (110s/60s typically) -Pt requested refill for albuterol inhaler (PRN use - last Rx 10/2019)  Recommendations/Changes made from today's visit: -No med changes -Refilled albuterol via office refill protocol  Plan: -[name] will call patient 3 months for BP check -Pharmacist follow up televisit scheduled for 6 months    Subjective: [name] [name] is an 88 y.o. year old male who is a primary patient of [name] [name], [name] Rising, MD.  The CCM team was consulted for assistance with disease management and care coordination needs.    Engaged with patient by telephone for follow up visit in response to provider referral for pharmacy case management and/or care coordination services.   Consent to Services:  The patient was given information about Chronic Care Management services, agreed to services, and gave verbal consent prior to initiation of services.  Please see initial visit note for detailed documentation.   Patient Care Team: [name] Ghent, MD as PCP - General (Family Medicine) [name] Epley, MD as PCP - Electrophysiology (Cardiology) [name], [name], MD as PCP - Cardiology (Cardiology) [name] Space, MD as Consulting Physician (Pulmonary Disease) [name], [name], MD as Consulting Physician (Cardiology) [name] [name], [facility] as Pharmacist (Pharmacist)   Recent office visits: 12/27/21 Dr [name] [name] OV: skin inflammation - wound dressings/Unna boot.  11/01/21 Dr [name] [name] OV: f/u - rx'd loratadine. Advised daily wt - take 1.5 Lasix for wt > 235. HGB 8.5 - start iron 325 mg and get iron studies.  10/10/21 Dr [name] [name] VV: f/u HF, OSA - fatigue.  06/13/21-PCP-[name] Duncan,MD-Patient presented for follow up bedsore. Discussed using barrier cream, started  Nystatin powder.. Flu shot given? Unable to tolerate compression stockings. Discussed cushion and icing for olecranon bursitis.continue O2   03/13/21-Internal Medicine-[name] Letvak,MD-Patient presented for cellulitis right leg.Dressing changes,start cephalexin 541m take 3 times daily for 1 week  Recent consult visits: 12/23/21 Dr [name] [name](hematology): VV - advised B12 injection weekly x 4 weeks. F/u 2 months - if no improvement, will need bone marrow biopsy.  12/09/21 Dr [name] [name](hematology): new consult - anemia. Labs ordered. 09/25/21 Dr [name] (Cardiology): f/u HF; euvolemic - SOB likely driven by lung disease; no changes 04/19/21-Cardiology-[name] End,MD-Telemedicine-Patient presented for follow up valvular heart disease. Add Losartan 12.530mtake 1 daily. Labs in 2 weeks, bring BP log to lab. 04/17/21-Cardiology-[name] Lambert,MD-Patient presented for Initial visit for AV block. EKG,recommend conservative treatment  01/16/21-Cardiology-[name] End,MD-Patient presented for follow up valvular heart disease. Increase furosemide to 4038make 1 tablet daily.Labs ordered.ECG ordered  Hospital visits: None in previous 6 months   Objective:  Lab Results  Component Value Date   CREATININE 1.22 12/09/2021   BUN 25 (H) 12/09/2021   GFR 71.99 10/28/2019   GFRNONAA 58 (L) 12/09/2021   GFRAA >60 12/03/2016   NA 140 12/09/2021   K 3.7 12/09/2021   CALCIUM 8.4 (L) 12/09/2021   CO2 38 (H) 12/09/2021   GLUCOSE 115 (H) 12/09/2021    Lab Results  Component Value Date/Time   HGBA1C 5.3 03/26/2018 11:26 AM   HGBA1C 5.5 11/14/2016 10:48 AM   GFR 71.99 10/28/2019 09:53 AM   GFR 72.57 03/26/2018 11:26 AM   MICROALBUR 4.7 (H) 10/01/2010 08:50 AM   MICROALBUR 6.4 (H) 01/10/2009 09:36 AM    Last diabetic Eye exam: No results  found for: "HMDIABEYEEXA"  Last diabetic Foot exam: No results found for: "HMDIABFOOTEX"   Lab Results  Component Value Date   CHOL 176 01/16/2021   HDL 53 01/16/2021    LDLCALC 108 (H) 01/16/2021   TRIG 77 01/16/2021   CHOLHDL 3.3 01/16/2021       Latest Ref Rng & Units 12/09/2021    3:14 PM 01/16/2021    5:38 PM 10/28/2019    9:53 AM  Hepatic Function  Total Protein 6.5 - 8.1 g/dL 6.8  6.8  6.6   Albumin 3.5 - 5.0 g/dL 2.9  3.4  3.7   AST 15 - 41 U/L _0 ALT 0 - 44 U/L _1 Alk Phosphatase 38 - 126 U/L 107  90  92   Total Bilirubin 0.3 - 1.2 mg/dL 0.7  1.0  0.6     Lab Results  Component Value Date/Time   TSH 5.055 (H) 12/09/2021 03:14 PM   TSH 3.299 01/16/2021 05:38 PM   TSH 2.62 04/11/2015 01:41 PM   TSH 3.90 10/01/2010 08:50 AM       Latest Ref Rng & Units 12/09/2021    3:14 PM 11/26/2021    2:36 PM 11/01/2021    4:07 PM  CBC  WBC 4.0 - 10.5 K/uL 6.8  6.1  6.8   Hemoglobin 13.0 - 17.0 g/dL 8.8  8.2 Repeated and verified X2.  8.6   Hematocrit 39.0 - 52.0 % 29.1  25.4  26.9   Platelets 150 - 400 K/uL 222  229.0  253     Lab Results  Component Value Date/Time   VD25OH 31.06 02/17/2020 10:42 AM   VD25OH 18.50 (L) 10/28/2019 09:53 AM    Clinical ASCVD: No  The ASCVD Risk score (Arnett DK, et al., 2019) failed to calculate for the following reasons:   The 2019 ASCVD risk score is only valid for ages 43 to 61       12/16/2021    2:25 PM 06/13/2021   12:25 PM 11/02/2019    2:05 PM  Depression screen PHQ 2/9  Decreased Interest 0 0 0  Down, Depressed, Hopeless 0 0 0  PHQ - 2 Score 0 0 0  Altered sleeping   0  Tired, decreased energy   0  Change in appetite   0  Feeling bad or failure about yourself    0  Trouble concentrating   0  Moving slowly or fidgety/restless   0  Suicidal thoughts   0  PHQ-9 Score   0  Difficult doing work/chores   Not difficult at all      Social History   Tobacco Use  Smoking Status Former   Packs/day: 1.50   Years: 53.00   Total pack years: 79.50   Types: Cigarettes   Quit date: 06/23/1999   Years since quitting: 22.5  Smokeless Tobacco Former   Types: Snuff, Chew   BP  Readings from Last 3 Encounters:  12/27/21 130/64  12/09/21 (!) 98/46  11/26/21 (!) 114/54   Pulse Readings from Last 3 Encounters:  12/27/21 74  12/09/21 82  11/01/21 83   Wt Readings from Last 3 Encounters:  12/27/21 234 lb (106.1 kg)  12/16/21 239 lb (108.4 kg)  12/09/21 239 lb (108.4 kg)   BMI Readings from Last 3 Encounters:  12/27/21 33.58 kg/m  12/16/21 34.29 kg/m  12/09/21 34.29 kg/m    Assessment/Interventions: Review of patient past medical history,  allergies, medications, health status, including review of consultants reports, laboratory and other test data, was performed as part of comprehensive evaluation and provision of chronic care management services.   SDOH:  (Social Determinants of Health) assessments and interventions performed: Yes   SDOH Screenings   Alcohol Screen: Low Risk  (12/16/2021)   Alcohol Screen    Last Alcohol Screening Score (AUDIT): 0  Depression (PHQ2-9): Low Risk  (12/16/2021)   Depression (PHQ2-9)    PHQ-2 Score: 0  Financial Resource Strain: Low Risk  (12/16/2021)   Overall Financial Resource Strain (CARDIA)    Difficulty of Paying Living Expenses: Not hard at all  Food Insecurity: No Food Insecurity (12/16/2021)   Hunger Vital Sign    Worried About Running Out of Food in the Last Year: Never true    [place] in the Last Year: Never true  Housing: Low Risk  (11/02/2019)   Housing    Last Housing Risk Score: 0  Physical Activity: Sufficiently Active (12/16/2021)   Exercise Vital Sign    Days of Exercise per Week: 3 days    Minutes of Exercise per Session: 60 min  Social Connections: Socially Integrated (12/16/2021)   Social Connection and Isolation Panel [NHANES]    Frequency of Communication with Friends and Family: More than three times a week    Frequency of Social Gatherings with Friends and Family: More than three times a week    Attends Religious Services: More than 4 times per year    Active Member of Clubs or  Organizations: Yes    Attends Archivist Meetings: More than 4 times per year    Marital Status: Married  Stress: No Stress Concern Present (11/02/2019)   [employer] [place] of Stress : Not at all  Tobacco Use: Medium Risk (12/27/2021)   Patient History    Smoking Tobacco Use: Former    Smokeless Tobacco Use: Former    Passive Exposure: Not on Pensions consultant Needs: No Transportation Needs (12/16/2021)   PRAPARE - Hydrologist (Medical): No    Lack of Transportation (Non-Medical): No    CCM Care Plan  Allergies  Allergen Reactions   Iohexol Hives and Swelling    Pt reports swelling, redness, hives, and blisters    Atorvastatin     REACTION: aches   Celecoxib     REACTION: rash   Clinoril [Sulindac]     REACTION: rash   Nitrofurantoin    Spiriva Handihaler [Tiotropium Bromide Monohydrate] Other (See Comments)    Dry mouth   Sulfa Antibiotics     Intolerant but unrecalled.     Medications Reviewed Today     Reviewed by [name] Ghent, MD (Physician) on 12/27/21 at 1124  Med List Status: <None>   Medication Order Taking? Sig Documenting Provider Last Dose Status Informant  albuterol (PROAIR HFA) 108 (90 Base) MCG/ACT inhaler [id number] Yes INHALE 1-2 PUFFS INTO THE LUNGS EVERY 6 (SIX) HOURS AS NEEDED FOR WHEEZING. [name] Ghent, MD Taking Active   aspirin EC 81 MG tablet [id number] Yes Take 1 tablet (81 mg total) by mouth daily. [name] Shi, PA-C Taking Active Self           Med Note Marylen Ponto   Wed May 23, 2015  1:57 PM) .  cyanocobalamin (,VITAMIN B-12,) 1000 MCG/ML injection [id number] Yes Inject 1 mL (1,000 mcg total) into the muscle  once a week. [name] Guadeloupe, MD Taking Active   furosemide (LASIX) 40 MG tablet [id number] Yes Take 1 tablet (40 mg total) by mouth daily. [name], [name], MD Taking Active   Iron, Ferrous Sulfate, 325 (65 Fe) MG TABS  [id number] Yes Take 325 mg by mouth daily. [name] Ghent, MD Taking Active   LORazepam (ATIVAN) 1 MG tablet [id number] Yes TAKE 1/4 TO 1/2 TABLET BY MOUTH EVERY 6 HOURS AS NEEDED [name] Ghent, MD Taking Active   OXYGEN [id number] Yes Inhale into the lungs. 5 Liters [provider] Taking Active Self  Respiratory Therapy Supplies (FLUTTER) DEVI [id number] Yes Use as directed Clance, [name] Reichert, MD Taking Active Self  SYMBICORT 160-4.5 MCG/ACT inhaler [id number] Yes TAKE 2 PUFFS BY MOUTH TWICE A DAY [name] Ghent, MD Taking Active   Syringe/Needle, Disp, (SYRINGE 3CC/25GX1") 25G X 1" 3 ML MISC [id number] Yes 1 mL by Does not apply route once a week. [name] Guadeloupe, MD Taking Active             Patient Active Problem List   Diagnosis Date Noted   Skin inflammation 12/29/2021   Rash 06/17/2021   Pressure sore 06/17/2021   Olecranon bursitis 06/17/2021   Valvular heart disease 04/19/2021   Venous stasis ulcer ([place]) 03/13/2021   Heart block 01/17/2021   Blurry vision 11/07/2019   Healthcare maintenance 11/07/2019   Insomnia 04/01/2018   Myalgia 12/03/2016   Vitamin D deficiency 11/21/2016   Chronic heart failure with preserved ejection fraction (HFpEF) ([place]) 07/13/2016   AAA (abdominal aortic aneurysm) without rupture ([place]) 07/13/2016   Chronic obstructive pulmonary disease ([place]) 12/27/2015   Fecal urgency 09/14/2015   S/P TAVR (transcatheter aortic valve replacement) 06/26/2015   Severe aortic stenosis 05/15/2015   Medicare annual wellness visit, subsequent 05/21/2014   Advance care planning 05/21/2014   Skin irritation 12/20/2013   Risk for falls 12/20/2013   Rib pain on right side 01/29/2013   Vertigo 07/01/2012   Right knee DJD 10/31/2011   Knee pain 10/23/2011   OSA (obstructive sleep apnea) 07/31/2009   Chronic respiratory failure with hypoxia ([place]) 08/03/2008   Morbid obesity ([place]) 02/22/2008   POLYP, COLON 08/27/2007   Anxiety state 08/27/2007   COPD (chronic  obstructive pulmonary disease) with emphysema ([place]) 08/27/2007   INTERNAL HEMORRHOIDS 03/23/2007   DIVERTICULOSIS, COLON 03/23/2007   SYMPTOM, PAIN, ABDOMINAL, GENERALIZED 02/18/2007   HYPERGLYCEMIA 02/18/2007   Essential hypertension 02/10/2007   NEOPLASM, SKIN, UNCERTAIN BEHAVIOR 41/74/0814   PEPTIC ULCER DISEASE WITH H-PYLORI  TX'D 11/19/2006   NEPHROLITHIASIS 11/19/2006   Backache 11/19/2006   BPH (benign prostatic hyperplasia) 11/19/2006    Immunization History  Administered Date(s) Administered   Influenza Split 06/17/2011, 02/23/2012   Influenza Whole 04/27/2006, 04/10/2009, 04/10/2010   Influenza, Seasonal, Injecte, Preservative Fre 06/13/2016   Influenza,inj,Quad PF,6+ Mos 03/17/2013, 04/24/2014, 03/17/2015, 07/15/2016, 03/26/2018   PFIZER(Purple Top)SARS-COV-2 Vaccination 07/18/2019, 08/08/2019, 05/16/2020   Pneumococcal Polysaccharide-23 02/15/1999, 10/20/2011   Td 02/05/2006, 01/08/2017   Zoster, Live 01/15/2009    Conditions to be addressed/monitored:  Hypertension, Heart Failure, COPD, and Anxiety  There are no care plans that you recently modified to display for this patient.     Medication Assistance: None required.  Patient affirms current coverage meets needs.  Compliance/Adherence/Medication fill history: Care Gaps: None  Star-Rating Drugs: Losartan - LF 04/19/21 x 90 ds; PDC 100%  Medication Access: Within the past 30 days, how often has patient missed a dose of medication? *** Is a pillbox  or other method used to improve adherence? {YES/NO:21197} Factors that may affect medication adherence? {CHL DESC; BARRIERS:21522} Are meds synced by current pharmacy? {YES/NO:21197} Are meds delivered by current pharmacy? {YES/NO:21197} Does patient experience delays in picking up medications due to transportation concerns? {YES/NO:21197}  [facility] Reviewed: Is patient disadvantaged to use [facility]?: {YES/NO:21197} Current Rx insurance plan:  *** Name and location of Current pharmacy:  [id number] [facility] [address]Phone: [phone]: [phone] [facility] services reviewed with patient today?: {YES/NO:21197} Patient requests to transfer care to [facility]?: {YES/NO:21197} Reason patient declined to change pharmacies: {US patient preference:27474}   Care Plan and Follow Up Patient Decision:  Patient agrees to Care Plan and Follow-up.  Plan: Telephone follow up appointment with care management team member scheduled for:  6 months  LCharlene Brooke PharmD, BCACP Clinical Pharmacist [facility] [phone]   Current Barriers:  Unable to independently monitor therapeutic efficacy  Pharmacist Clinical Goal(s):  Patient will achieve adherence to monitoring guidelines and medication adherence to achieve therapeutic efficacy through collaboration with PharmD and provider.   Interventions: 1:1 collaboration with DTonia Ghent MD regarding development and update of comprehensive plan of care as evidenced by provider attestation and co-signature Inter-disciplinary care team collaboration (see longitudinal plan of care) Comprehensive medication review performed; medication list updated in electronic medical record  Heart Failure / Hypertension (BP goal <140/90) -Controlled - pt-reported BP at home is at goal; pt endorses compliance with lasix and reports swelling is stable; he reports chronic redness on legs and hx of "weeping" from legs, currently stable -Last ejection fraction: 60-65% (Date: 04/11/21) -HF type: Diastolic; NYHA Class: III (marked limitation of activity) -Hx OSA on CPAP; s/p TAVR 2017 -Current treatment: Furosemide 40 mg daily - Appropriate, Effective, Safe, Accessible -Medications previously tried: amlodipine, lisinopril, metoprolol, losartan -Current exercise habits: limited - cannot walk  without walker -Educated on BP goals and benefits of medications for prevention of heart attack, stroke and kidney damage;Daily salt intake goal < 2300 mg; -Recommended to continue current medication  COPD (Goal: control symptoms and prevent exacerbations) -Controlled - pt reports symptoms are mostly stable after [place] he still has occasional coughing spells/chest congestion and takes Mucinex and Robitussin for symptoms -Gold Grade: unknown -Pulmonary function testing: PFT's 2000: FEV1 1.33 (37%), ratio 63, +airtrapping, DLCO 70% -Current treatment  Symbicort 160-4.5 mcg/act 2 puffs BID -Appropriate, Effective, Safe, Accessible Albuterol HFA prn -Appropriate, Effective, Safe, Accessible Oxygen 5L continuous -Medications previously tried: Spiriva, nebulizer (blisters in mouth) -Patient reports consistent use of maintenance inhaler -Frequency of rescue inhaler use: occasional -Counseled on Proper inhaler technique; Benefits of consistent maintenance inhaler use -Recommended to continue current medication; coordinate refill of albuterol HFA  Anxiety (Goal: manage symptoms) -Controlled - uses lorazepam to help with sleep, usually 1/4 tablet when needed -GAD7 - not on file -Current treatment  Lorazepam 1 mg - 1/4-1/2 tab PRN -Appropriate, Effective, Safe, Accessible -Discussed risks of lorazepam use including oversedation; pt denies side effects -Recommended to continue current medication  Hyperlipidemia: (LDL goal < 130) -Controlled without medication - LDL 108 -Educated on Importance of limiting foods high in cholesterol; -Counseled on diet and exercise extensively  Anemia (Goal: ***) -{US controlled/uncontrolled:25276} -Established with hematology 11/2021 (Dr [name] [name] -Current treatment  Vitamin B12 injection weekly Ferrous sulfate 325 mg daily -Medications previously tried: n/a  -{CCMPHARMDINTERVENTION:25122}  Health Maintenance -Vaccine gaps: Flu, Covid booster,  Prevnar, Shingrix -Current therapy:  Aspirin 81 mg daily Nystatin powder +Mucinex ER  +Robitussin +Vitamin C 500 mg -Patient is satisfied with current therapy and denies issues -Recommended to continue current medication  Patient Goals/Self-Care Activities Patient will:  - take medications as prescribed as evidenced by patient report and record review focus on medication adherence by pill box check blood pressure periodically, document, and provide at future appointments engage in dietary modifications by reducing salt

## 2022-01-03 ENCOUNTER — Telehealth: Payer: Self-pay | Admitting: Family Medicine

## 2022-01-03 NOTE — Telephone Encounter (Signed)
Called and spoke with [facility] gave her the verbal okay for hospice care for patient , per Dr.Duncan.

## 2022-01-03 NOTE — Telephone Encounter (Signed)
Called and spoke with [facility] gave her the verbal okay for hospice care for patient , per Dr.Duncan.

## 2022-01-03 NOTE — Telephone Encounter (Signed)
Notice from [facility].  =================== Good afternoon/morning, my name is Chakyra. I am contacting you from [facility] in regard to patient [name] [name], [name] [name] , DOB September 23, 1933. We received a hospice referral for this patient and (the family/patient) have requested that Dr. [name], MD serve as hospice attending. I am calling to see if Dr. [name], MD would serve as hospice attending and if he/she certifies that this patient is terminally ill and has a life expectancy of 6 months or less if the terminal illness runs its natural course? Additionally, if you would like to manage comfort orders or have our team handle this part. You can agree to serve as Hospice Attending, but decline managing comfort orders. This message is time-sensitive and am in need of a call back within 48 hours (or by the end of the day). Thanks! ======================== Please verify request with patient.  If patient wants hospice eval then I support that decision and I can attend with hospice help.  If patient desires hospice eval, then please give notice to hospice agency.  Thanks.

## 2022-01-03 NOTE — Telephone Encounter (Signed)
See other phone note.  Thanks.

## 2022-01-03 NOTE — Telephone Encounter (Signed)
Shacarra from [facility] called wanting to know if Dr. Damita Dunnings could serve as attending on record with Hospice. They also wanted to know if he could sign off on comfort care orders. Thank you!

## 2022-01-06 ENCOUNTER — Other Ambulatory Visit: Payer: Self-pay | Admitting: Oncology

## 2022-01-07 ENCOUNTER — Telehealth: Payer: Self-pay | Admitting: Family Medicine

## 2022-01-07 DIAGNOSIS — J9611 Chronic respiratory failure with hypoxia: Secondary | ICD-10-CM

## 2022-01-07 NOTE — Telephone Encounter (Signed)
Message is from [facility]

## 2022-01-07 NOTE — Telephone Encounter (Signed)
Vida Roller with [facility] called and said that their Nurse practitioner wanted to see if Dr Damita Dunnings could put in an order for PT, OT, and woundcare for Home health with whoever can see him for it.  Callback is [phone]

## 2022-01-08 ENCOUNTER — Telehealth: Payer: Self-pay | Admitting: Family Medicine

## 2022-01-08 NOTE — Telephone Encounter (Signed)
Spoke with patients daughter Hilda Blades and she does want the services for her Dad. She thinks it will be good for him to have [place].

## 2022-01-08 NOTE — Telephone Encounter (Signed)
Kendrick Fries with [employer] called and said that they supply patients oxygen and cpap machine and hes due for both and patient hasnt seen his pulmonologist in a while and didn't know if Dr Damita Dunnings would sign an order for the new supply. Callback is [phone] ext [phone]

## 2022-01-08 NOTE — Addendum Note (Signed)
Addended by: Tonia Ghent on: 01/08/2022 02:47 PM   Modules accepted: Orders

## 2022-01-08 NOTE — Telephone Encounter (Signed)
Did patient consent for this?  If so, I can order.  Thanks.

## 2022-01-08 NOTE — Telephone Encounter (Signed)
Ordered. Thanks

## 2022-01-09 NOTE — Telephone Encounter (Signed)
LM for [name] [name] to call back

## 2022-01-09 NOTE — Telephone Encounter (Signed)
[name] [name] returned my call and I advised her this needs to come from pulmonary. She is going to have patient call and make an appt with them.

## 2022-01-09 NOTE — Telephone Encounter (Signed)
Please have them check with pulmonary.

## 2022-01-13 NOTE — Telephone Encounter (Signed)
Called Kelly back at [facility] to see what this was pertaining to. Claiborne Billings was asking about the [facility] referral that was requested last week. Per EMR [facility] was [facility]. She is going to f/u with them to see if they are going to take patient on as the family has not heard anything yet.

## 2022-01-13 NOTE — Telephone Encounter (Signed)
[name] [name] from [facility] called and wanted what agency the patient would be using. Call back number [phone].

## 2022-01-20 NOTE — Telephone Encounter (Signed)
[name] [name] from [facility] called back in stating that [facility] still haven't received the referral for Home Health. Was wanting it to be sent back over. Thank you!

## 2022-01-21 ENCOUNTER — Other Ambulatory Visit: Payer: Self-pay | Admitting: Internal Medicine

## 2022-01-22 ENCOUNTER — Telehealth: Payer: Self-pay | Admitting: Family Medicine

## 2022-01-22 NOTE — Telephone Encounter (Signed)
Patient wife [name] [name] called in and said he needs a new CPAP machine. Dr. Halford Chessman usually the provider who signs for it but he has nothing available until 3 months from now. A message is popping up stating that its time to replace and its not working right. Patient [name] [name] and his wife [name] [name] was wondering if Dr. Damita Dunnings can sign off on a new machine for him because they are afraid it will stop working before they can see Dr. Halford Chessman. [facility] called and waiting for them to return there call. Would like a phone call is he can signoff on it. Please Advise. Thank you!

## 2022-01-23 NOTE — Telephone Encounter (Signed)
I think they need to run this by pulmonary in the meantime.

## 2022-01-24 NOTE — Telephone Encounter (Signed)
Tried to call patients wife back but number just rings then asked to enter a remote access code.

## 2022-01-27 NOTE — Telephone Encounter (Signed)
Called and spoke with patients wife. Advised her they need to contact pulmonary office about this. She agreed and states she will call them again.

## 2022-01-28 ENCOUNTER — Other Ambulatory Visit: Payer: Self-pay | Admitting: Oncology

## 2022-02-07 DIAGNOSIS — G4733 Obstructive sleep apnea (adult) (pediatric): Secondary | ICD-10-CM | POA: Diagnosis not present

## 2022-02-11 ENCOUNTER — Encounter: Payer: Self-pay | Admitting: Family Medicine

## 2022-02-11 ENCOUNTER — Telehealth: Payer: Self-pay | Admitting: Family Medicine

## 2022-02-11 ENCOUNTER — Ambulatory Visit (INDEPENDENT_AMBULATORY_CARE_PROVIDER_SITE_OTHER): Payer: PPO | Admitting: Family Medicine

## 2022-02-11 VITALS — BP 130/60 | HR 82 | Temp 97.5°F | Ht 70.0 in | Wt 232.0 lb

## 2022-02-11 DIAGNOSIS — K5900 Constipation, unspecified: Secondary | ICD-10-CM | POA: Diagnosis not present

## 2022-02-11 DIAGNOSIS — G4733 Obstructive sleep apnea (adult) (pediatric): Secondary | ICD-10-CM

## 2022-02-11 DIAGNOSIS — D649 Anemia, unspecified: Secondary | ICD-10-CM

## 2022-02-11 DIAGNOSIS — L97909 Non-pressure chronic ulcer of unspecified part of unspecified lower leg with unspecified severity: Secondary | ICD-10-CM

## 2022-02-11 DIAGNOSIS — L989 Disorder of the skin and subcutaneous tissue, unspecified: Secondary | ICD-10-CM | POA: Diagnosis not present

## 2022-02-11 DIAGNOSIS — I83009 Varicose veins of unspecified lower extremity with ulcer of unspecified site: Secondary | ICD-10-CM | POA: Diagnosis not present

## 2022-02-11 LAB — CBC WITH DIFFERENTIAL/PLATELET
Basophils Absolute: 0.1 10*3/uL (ref 0.0–0.1)
Basophils Relative: 0.8 % (ref 0.0–3.0)
Eosinophils Absolute: 0.9 10*3/uL — ABNORMAL HIGH (ref 0.0–0.7)
Eosinophils Relative: 12 % — ABNORMAL HIGH (ref 0.0–5.0)
HCT: 27 % — ABNORMAL LOW (ref 39.0–52.0)
Hemoglobin: 8.8 g/dL — ABNORMAL LOW (ref 13.0–17.0)
Lymphocytes Relative: 14.3 % (ref 12.0–46.0)
Lymphs Abs: 1.1 10*3/uL (ref 0.7–4.0)
MCHC: 32.8 g/dL (ref 30.0–36.0)
MCV: 94.2 fl (ref 78.0–100.0)
Monocytes Absolute: 0.5 10*3/uL (ref 0.1–1.0)
Monocytes Relative: 6.5 % (ref 3.0–12.0)
Neutro Abs: 5 10*3/uL (ref 1.4–7.7)
Neutrophils Relative %: 66.4 % (ref 43.0–77.0)
Platelets: 279 10*3/uL (ref 150.0–400.0)
RBC: 2.87 Mil/uL — ABNORMAL LOW (ref 4.22–5.81)
RDW: 14.6 % (ref 11.5–15.5)
WBC: 7.5 10*3/uL (ref 4.0–10.5)

## 2022-02-11 LAB — TSH: TSH: 5.81 u[IU]/mL — ABNORMAL HIGH (ref 0.35–5.50)

## 2022-02-11 LAB — IBC PANEL
Iron: 50 ug/dL (ref 42–165)
Saturation Ratios: 24 % (ref 20.0–50.0)
TIBC: 208.6 ug/dL — ABNORMAL LOW (ref 250.0–450.0)
Transferrin: 149 mg/dL — ABNORMAL LOW (ref 212.0–360.0)

## 2022-02-11 LAB — FERRITIN: Ferritin: 72.5 ng/mL (ref 22.0–322.0)

## 2022-02-11 LAB — VITAMIN B12: Vitamin B-12: >1500 pg/mL — ABNORMAL HIGH (ref 211–911)

## 2022-02-11 NOTE — Telephone Encounter (Signed)
Mariam from [facility] called in again today,stating that [facility] still haven't received referral for [facility]. Wanted it to be sent back over if possible.

## 2022-02-11 NOTE — Patient Instructions (Addendum)
Go to the lab on the way out.   If you have mychart we'll likely use that to update you.    I'll check with pulmonary in the meantime.   I would try OTC miralax 1 cap a day if needed.   Take care.  Glad to see you.  Ask hematology about changing to a video visit.

## 2022-02-11 NOTE — Telephone Encounter (Signed)
error 

## 2022-02-11 NOTE — Progress Notes (Unsigned)
D/w pt about pulmonary/OSA.  His CPAP machine isn't working. It broke about 2 months ago.  He needs a new order for a new machine.  He has supplies in the meantime.  I told him I would check with pulmonary in the meantime.   Note sent to referral coordinator about getting update on referral to [facility].  D/w pt.    Legs d/w pt.  Still with some L ankle pain.  Swelling is better.  Weeping is clearly better in the meantime, but still occasionally present.  Still using unna boot at baseline.  Itching and leg pain clearly improved.  He is using a passive ROM device on his legs when he is sitting.  Report feeling cold.  He and his wife keep the house warm.  Minimal TSH elevation, similar to prev.  On B12 replacement.  H/o anemia.    He has a scalp lesion he wanted evaluated.  Not draining.  Not red.  Not tender.  See exam.  BMs d/w pt.  Constipation noted.  Could be from iron.  D/w pt about using miralax and rechecking his labs today to see if he needed to continue iron.  Meds, vitals, and allergies reviewed.   ROS: Per HPI unless specifically indicated in ROS section   Nad Ncat Lesion noted on the L side of scalp.  1cm.  More likely to be a cyst and not a lipoma.  Not erythematous or draining.  Not tender. Neck supple,l no LA On O2 at baseline.  ctab  Rrr Lower extremities wrapped with Unna boots.

## 2022-02-11 NOTE — Telephone Encounter (Signed)
What is the status of his referral to [facility]?  Thanks.

## 2022-02-11 NOTE — Telephone Encounter (Signed)
Can you please change the [facility] that [facility] to [facility]?

## 2022-02-12 ENCOUNTER — Ambulatory Visit: Payer: Self-pay

## 2022-02-12 ENCOUNTER — Telehealth: Payer: Self-pay | Admitting: Family Medicine

## 2022-02-12 ENCOUNTER — Other Ambulatory Visit: Payer: Self-pay | Admitting: Family Medicine

## 2022-02-12 DIAGNOSIS — L989 Disorder of the skin and subcutaneous tissue, unspecified: Secondary | ICD-10-CM | POA: Insufficient documentation

## 2022-02-12 DIAGNOSIS — D649 Anemia, unspecified: Secondary | ICD-10-CM

## 2022-02-12 DIAGNOSIS — K5900 Constipation, unspecified: Secondary | ICD-10-CM | POA: Insufficient documentation

## 2022-02-12 NOTE — Telephone Encounter (Signed)
[name] [name] - patient needs visit with me since he has not been seen in 2 years. He has an apt scheduled for 10/25, see if we can moved this up. Will need to bring in SD card if he has one for compliance.   Cc: Dr. Damita Dunnings

## 2022-02-12 NOTE — Assessment & Plan Note (Addendum)
We recheck his labs today.  See notes on labs. I will update hematology about labs.  I will ask hematology about changing to a video visit, since it is difficult for him to come to the clinic.  I thank all involved.  I think he likely has a multifactorial issue where he feels cold at home.  I doubt his TSH elevation is contributing.  This is similar to previous.

## 2022-02-12 NOTE — Telephone Encounter (Signed)
After speaking with Lattie Haw and Dr. Darnell Level about how to order this or if we could; I was told we can not order it. Dr. Darnell Level stated that this has to come from pulmonary and also that they should be able to rent him on at least until they can see him.

## 2022-02-12 NOTE — Assessment & Plan Note (Signed)
History of, continue Unna boots and we will see about getting home health/wound care set up.

## 2022-02-12 NOTE — Telephone Encounter (Signed)
I meant to send this to Geraldo Pitter.  I have mistakenly initially routed it to [employer].  I apologize.  See below.  Thanks.

## 2022-02-12 NOTE — Telephone Encounter (Signed)
Referral changed and sent to [facility]  See referral

## 2022-02-12 NOTE — Assessment & Plan Note (Signed)
This looks benign and I think it makes sense to leave it alone for now.  He agreed.  He can update me if is more bothersome.

## 2022-02-12 NOTE — Assessment & Plan Note (Signed)
Could be related to iron.  Can try MiraLAX.  See notes on labs.

## 2022-02-12 NOTE — Assessment & Plan Note (Signed)
See above.  I will ask for pulmonary input.

## 2022-02-12 NOTE — Patient Outreach (Signed)
  Care Coordination   02/12/2022 Name: [name] [name] MRN: [id number] DOB: September 29, 1933   Care Coordination Outreach Attempts:  An unsuccessful telephone outreach was attempted today to offer the patient information about available care coordination services as a benefit of their health plan.   Follow Up Plan:  Additional outreach attempts will be made to offer the patient care coordination information and services.   Encounter Outcome:  No Answer  Care Coordination Interventions Activated:  No   Care Coordination Interventions:  No, not indicated    Quinn Plowman RN,BSN,CCM RN Care Manager Coordinator [phone]

## 2022-02-12 NOTE — Telephone Encounter (Signed)
Noted - referral changed.

## 2022-02-12 NOTE — Telephone Encounter (Signed)
He does not need new CPAP supplies right now.  He needs a new machine.  His old machine broke.  Can you send an order for a replacement machine in the meantime?  Many thanks.

## 2022-02-13 ENCOUNTER — Ambulatory Visit: Payer: Self-pay

## 2022-02-13 NOTE — Patient Instructions (Signed)
Visit Information  Thank you for taking time to visit with me today. Please don't hesitate to contact me if I can be of assistance to you.   Following are the goals we discussed today:   Goals Addressed             This Visit's Progress    COMPLETED: Care coordination activities - No follow up required       Care Coordination Interventions: Care coordination program/ services discussed Social determinants of health survey completed Advised to contact provider if care coordination services needed in the future          If you are experiencing a Mental Health or [place] or need someone to talk to, please call 1-800-273-TALK (toll free, 24 hour hotline)  Patient verbalizes understanding of instructions and care plan provided today and agrees to view in [place]. Active MyChart status and patient understanding of how to access instructions and care plan via MyChart confirmed with patient.     Quinn Plowman RN,BSN,CCM RN Care Manager Coordinator [phone]

## 2022-02-13 NOTE — Patient Outreach (Signed)
  Care Coordination   Initial Visit Note   02/13/2022 Name: REZNOR FERRANDO MRN: [id number] DOB: [birth year]  Desiree Lucy Minton is a 88 y.o. year old male who sees Tonia Ghent, MD for primary care. I  spoke with patient's daughter/ designated party release, Mathis Bud today.   What matters to the patients health and wellness today?  Daughter states patient has [facility] services. She states [facility] is referring patient to home health for wound care and home physical therapy.  Daughter states patient does not have any further needs at this time.    Goals Addressed             This Visit's Progress    COMPLETED: Care coordination activities - No follow up required       Care Coordination Interventions: Care coordination program/ services discussed Social determinants of health survey completed Advised to contact provider if care coordination services needed in the future          SDOH assessments and interventions completed:  Yes  SDOH Interventions Today    Flowsheet Row Most Recent Value  SDOH Interventions   Food Insecurity Interventions Intervention Not Indicated  Housing Interventions Intervention Not Indicated  Transportation Interventions Intervention Not Indicated        Care Coordination Interventions Activated:  No  Care Coordination Interventions:  No, not indicated   Follow up plan: No further intervention required.   Encounter Outcome:  Pt. Visit Completed   Quinn Plowman RN,BSN,CCM [place] Coordinator [phone]

## 2022-02-14 ENCOUNTER — Telehealth: Payer: Self-pay | Admitting: Family Medicine

## 2022-02-14 NOTE — Telephone Encounter (Signed)
Attempted to call pt to see if he wanted to move his appt up sooner but unable to reach. Left message for him to return call and also stated to him that front staff can make a sooner appt if he wanted to move it up.

## 2022-02-14 NOTE — Telephone Encounter (Signed)
Patient daughter [name] [name] called in and was following up on the referral that was suppose to be sent over to [facility]. She stated that [facility] was suppose to reach out to you regarding this. Please advise. Thank  you!

## 2022-02-16 NOTE — Telephone Encounter (Signed)
What is the status of his [place] referral?  Please let me and his family know.  Thanks.

## 2022-02-18 NOTE — Telephone Encounter (Signed)
Referral has been changed per Ashtyn in another phone note.

## 2022-02-18 NOTE — Telephone Encounter (Signed)
Family has been notified that the new referral has [facility] to [facility].

## 2022-02-20 NOTE — Telephone Encounter (Signed)
Mary from [facility] called in and stated that referral was suppose to go over to [facility], not to [facility]. She stated if you have any questions to give her a call at [phone]. Thank you!

## 2022-02-22 ENCOUNTER — Other Ambulatory Visit: Payer: Self-pay | Admitting: Nurse Practitioner

## 2022-02-24 ENCOUNTER — Telehealth: Payer: Self-pay | Admitting: Family Medicine

## 2022-02-24 ENCOUNTER — Encounter: Payer: Self-pay | Admitting: Oncology

## 2022-02-24 ENCOUNTER — Other Ambulatory Visit: Payer: PPO

## 2022-02-24 ENCOUNTER — Inpatient Hospital Stay: Payer: PPO | Attending: Oncology | Admitting: Oncology

## 2022-02-24 ENCOUNTER — Encounter: Payer: Self-pay | Admitting: Family Medicine

## 2022-02-24 DIAGNOSIS — L03116 Cellulitis of left lower limb: Secondary | ICD-10-CM | POA: Diagnosis not present

## 2022-02-24 DIAGNOSIS — E538 Deficiency of other specified B group vitamins: Secondary | ICD-10-CM | POA: Insufficient documentation

## 2022-02-24 DIAGNOSIS — D649 Anemia, unspecified: Secondary | ICD-10-CM | POA: Insufficient documentation

## 2022-02-24 DIAGNOSIS — I739 Peripheral vascular disease, unspecified: Secondary | ICD-10-CM | POA: Diagnosis not present

## 2022-02-24 DIAGNOSIS — D692 Other nonthrombocytopenic purpura: Secondary | ICD-10-CM | POA: Diagnosis not present

## 2022-02-24 DIAGNOSIS — Z7951 Long term (current) use of inhaled steroids: Secondary | ICD-10-CM | POA: Diagnosis not present

## 2022-02-24 DIAGNOSIS — L97529 Non-pressure chronic ulcer of other part of left foot with unspecified severity: Secondary | ICD-10-CM | POA: Diagnosis not present

## 2022-02-24 DIAGNOSIS — Z7982 Long term (current) use of aspirin: Secondary | ICD-10-CM | POA: Diagnosis not present

## 2022-02-24 DIAGNOSIS — L03115 Cellulitis of right lower limb: Secondary | ICD-10-CM | POA: Diagnosis not present

## 2022-02-24 DIAGNOSIS — G4733 Obstructive sleep apnea (adult) (pediatric): Secondary | ICD-10-CM | POA: Diagnosis not present

## 2022-02-24 DIAGNOSIS — L97909 Non-pressure chronic ulcer of unspecified part of unspecified lower leg with unspecified severity: Secondary | ICD-10-CM

## 2022-02-24 DIAGNOSIS — J449 Chronic obstructive pulmonary disease, unspecified: Secondary | ICD-10-CM | POA: Diagnosis not present

## 2022-02-24 DIAGNOSIS — Z79899 Other long term (current) drug therapy: Secondary | ICD-10-CM | POA: Insufficient documentation

## 2022-02-24 DIAGNOSIS — Z9981 Dependence on supplemental oxygen: Secondary | ICD-10-CM | POA: Diagnosis not present

## 2022-02-24 DIAGNOSIS — J9610 Chronic respiratory failure, unspecified whether with hypoxia or hypercapnia: Secondary | ICD-10-CM | POA: Diagnosis not present

## 2022-02-24 NOTE — Progress Notes (Signed)
I connected with [name] [name] on 02/24/22 at 10:15 AM EDT by video enabled telemedicine visit and verified that I am speaking with the correct person using two identifiers.   I discussed the limitations, risks, security and privacy concerns of performing an evaluation and management service by telemedicine and the availability of in-person appointments. I also discussed with the patient that there may be a patient responsible charge related to this service. The patient expressed understanding and agreed to proceed.  Other persons participating in the visit and their role in the encounter:  none  Patient's location:  home Provider's location:  work  Diagnosis-normocytic anemia possibly a component of chronic kidney disease  Chief complaint/ Reason for visit-routine follow-up of anemia  Heme/Onc history: Patient is a 88 year old male with a past medical history significant for chronic heart failure with preserved EF, obstructive sleep apnea, chronic respiratory failure, COPD, hypertension diabetes BPH among other medical problems.  He has been referred to [place] for anemia.Most recent CBC from 11/26/2021 showed H&H of 8.2/25.4 with an MCV of 95.4.  White count and platelet count were normal.  Serum iron was normal at 66.  Looking back at his prior CBCs his hemoglobin at baseline is between 12-13.  He had drifted down to 10.7 in August 2022 and 8.6 since May 2023.  Baseline creatinine in May 2023 was normal. Patient has chronic fatigue and exertional shortness of breath.  Appetite is good and weight has remained stable.  Interval history-patient hasBaseline fatigue which is remained unchanged.  He has received 4 weekly doses of B12 injection  ECOG PS- 3 Pain scale- 0   Review of Systems  Constitutional:  Positive for malaise/fatigue. Negative for chills, fever and weight loss.  HENT:  Negative for congestion, ear discharge and nosebleeds.   Eyes:  Negative for blurred vision.  Respiratory:  Negative  for cough, hemoptysis, sputum production, shortness of breath and wheezing.   Cardiovascular:  Negative for chest pain, palpitations, orthopnea and claudication.  Gastrointestinal:  Negative for abdominal pain, blood in stool, constipation, diarrhea, heartburn, melena, nausea and vomiting.  Genitourinary:  Negative for dysuria, flank pain, frequency, hematuria and urgency.  Musculoskeletal:  Negative for back pain, joint pain and myalgias.  Skin:  Negative for rash.  Neurological:  Negative for dizziness, tingling, focal weakness, seizures, weakness and headaches.  Endo/Heme/Allergies:  Does not bruise/bleed easily.  Psychiatric/Behavioral:  Negative for depression and suicidal ideas. The patient does not have insomnia.     Allergies  Allergen Reactions   Iohexol Hives and Swelling    Pt reports swelling, redness, hives, and blisters    Atorvastatin     REACTION: aches   Celecoxib     REACTION: rash   Clinoril [Sulindac]     REACTION: rash   Nitrofurantoin    Spiriva Handihaler [Tiotropium Bromide Monohydrate] Other (See Comments)    Dry mouth   Sulfa Antibiotics     Intolerant but unrecalled.     Past Medical History:  Diagnosis Date   AAA (abdominal aortic aneurysm) ([place])    a. 3.8 cm CTA 05/2015 b. 3.9 cm by [place] in 11/2017   Anxiety    COPD (chronic obstructive pulmonary disease) ([place])    on home O2 4LPM   Diverticulosis    Former smoker    quit 2001   Hemorrhoids    Kidney stones    passed spontaneously   OSA on CPAP    last study- in the home possibility- 2015, pt. unsure  Prediabetes    Hgb A1C 5.8 in Jan 2017   PUD (peptic ulcer disease)    Right knee DJD 10/31/2011   Severe   S/P TAVR (transcatheter aortic valve replacement) 06/26/2015   26 mm Edwards Sapien 3 transcatheter heart valve placed via percutaneous right transfemoral approach   Severe aortic stenosis    a. Echo 11/16:  Mod LVH, EF 55-60%, no RWMA, Gr 1 DD, severe AS (mean 51 mmHg; peak 86 mmHg),  MAC, mild LAE  b. s/p TAVR on 06/26/2015 with 72m Edward Sapien 3 THV   Shortness of breath dyspnea     Past Surgical History:  Procedure Laterality Date   CARDIAC CATHETERIZATION N/A 05/15/2015   Procedure: Right/Left Heart Cath and Coronary Angiography;  Surgeon: MSherren Mocha MD;  [facility];  Service: Cardiovascular;  Laterality: N/A;   CHOLECYSTECTOMY     EYE SURGERY     bilateral cataracts removed, /w IOL   KNEE SURGERY Right 1985   arthrosopic -    TEE WITHOUT CARDIOVERSION N/A 06/26/2015   Procedure: TRANSESOPHAGEAL ECHOCARDIOGRAM (TEE);  Surgeon: MSherren Mocha MD;  [facility]  Service: Open Heart Surgery;  Laterality: N/A;   TONSILLECTOMY     TRANSCATHETER AORTIC VALVE REPLACEMENT, TRANSFEMORAL N/A 06/26/2015   Procedure: TRANSCATHETER AORTIC VALVE REPLACEMENT, TRANSFEMORAL;  Surgeon: MSherren Mocha MD;  [facility]  Service: Open Heart Surgery;  Laterality: N/A;    Social History   Socioeconomic History   Marital status: Married    Spouse name: Not on file   Number of children: 1   Years of education: Not on file   Highest education level: Not on file  Occupational History   Occupation: Retired    Comment: [employer] 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     Social Determinants of Health   Financial Resource Strain: Low Risk  (12/16/2021)   Overall Financial Resource Strain (CARDIA)    Difficulty  of Paying Living Expenses: Not hard at all  Food Insecurity: No Food Insecurity (02/13/2022)   Hunger Vital Sign    Worried About Running Out of Food in the Last Year: Never true    Ran Out of Food in the Last Year: Never true  Transportation Needs: No Transportation Needs (02/13/2022)   PRAPARE - THydrologist(Medical): No    Lack of Transportation (Non-Medical): No  Physical Activity: Sufficiently Active (12/16/2021)   Exercise Vital Sign    Days of Exercise per Week: 3 days    Minutes of Exercise per Session: 60 min  Stress: No Stress Concern Present (11/02/2019)   [place]   Feeling of Stress : Not at all  Social Connections: [place](12/16/2021)  Social Licensed conveyancer [NHANES]    Frequency of Communication with Friends and Family: More than three times a week    Frequency of Social Gatherings with Friends and Family: More than three times a week    Attends Religious Services: More than 4 times per year    Active Member of [employer] or Organizations: Yes    Attends Music therapist: More than 4 times per year    Marital Status: Married  Human resources officer Violence: Not At Risk (12/16/2021)   Humiliation, Afraid, Rape, and Kick questionnaire    Fear of Current or Ex-Partner: No    Emotionally Abused: No    Physically Abused: No    Sexually Abused: No    Family History  Problem Relation Age of Onset   Stroke Mother    Heart attack Mother 76       deceased   Prostate cancer Father 59   Prostate cancer Brother    Colon cancer Neg Hx      Current Outpatient Medications:    albuterol (PROAIR HFA) 108 (90 Base) MCG/ACT inhaler, INHALE 1-2 PUFFS INTO THE LUNGS EVERY 6 (SIX) HOURS AS NEEDED FOR WHEEZING., Disp: 8.5 g, Rfl: 5   aspirin EC 81 MG tablet, Take 1 tablet (81 mg total) by mouth daily., Disp: , Rfl:    cyanocobalamin (VITAMIN B12) 1000 MCG/ML  injection, INJECT 1 ML (1,000 MCG TOTAL) INTO THE MUSCLE ONCE A WEEK., Disp: 4 mL, Rfl: 0   furosemide (LASIX) 40 MG tablet, TAKE 1 TABLET BY MOUTH EVERY DAY, Disp: 90 tablet, Rfl: 0   Iron, Ferrous Sulfate, 325 (65 Fe) MG TABS, Take 325 mg by mouth daily., Disp: , Rfl:    LORazepam (ATIVAN) 1 MG tablet, TAKE 1/4 TO 1/2 TABLET BY MOUTH EVERY 6 HOURS AS NEEDED, Disp: 30 tablet, Rfl: 1   OXYGEN, Inhale into the lungs. 5 Liters, Disp: , Rfl:    Respiratory Therapy Supplies (FLUTTER) DEVI, Use as directed, Disp: 1 each, Rfl: 0   SYMBICORT 160-4.5 MCG/ACT inhaler, TAKE 2 PUFFS BY MOUTH TWICE A DAY, Disp: 30.6 each, Rfl: 3   Syringe/Needle, Disp, (SYRINGE 3CC/25GX1") 25G X 1" 3 ML MISC, 1 mL by Does not apply route once a week., Disp: 100 each, Rfl: 1   Wound Dressings (CURITY UNNA BOOT) MISC, Dispense 4 rolls of unna boot wrap to use on legs with coban., Disp: 4 each, Rfl: 2  No results found.  No images are attached to the encounter.      Latest Ref Rng & Units 12/09/2021    3:14 PM  CMP  Glucose 70 - 99 mg/dL 115   BUN 8 - 23 mg/dL 25   Creatinine 0.61 - 1.24 mg/dL 1.22   Sodium 135 - 145 mmol/L 140   Potassium 3.5 - 5.1 mmol/L 3.7   Chloride 98 - 111 mmol/L 94   CO2 22 - 32 mmol/L 38   Calcium 8.9 - 10.3 mg/dL 8.4   Total Protein 6.5 - 8.1 g/dL 6.8   Total Bilirubin 0.3 - 1.2 mg/dL 0.7   Alkaline Phos 38 - 126 U/L 107   AST 15 - 41 U/L 22   ALT 0 - 44 U/L 12       Latest Ref Rng & Units 02/11/2022   12:55 PM  CBC  WBC 4.0 - 10.5 K/uL 7.5   Hemoglobin 13.0 - 17.0 g/dL 8.8 Repeated and verified X2.   Hematocrit 39.0 - 52.0 % 27.0  Platelets 150.0 - 400.0 K/uL 279.0      Observation/objective: Appears fatigued on home oxygen  Assessment and plan: Patient is a 88 year old male here for routine follow-up of normocytic anemia  Peripheral blood anemia work-up was otherwise negative other than mildly low B12 levels.  Despite 2 months of B12 injection has anemia has not improved  significantly.  Patient's creatinine over the years has gradually increased from 0.8-1.2.  Not much of a decline in his GFR.Given his overall performance status being poor I am inclined to give him a trial of Retacrit for his anemia before proceeding for a bone marrow biopsy.  Discussed risks and benefits of Retacrit including all but not limited to possible risk of thromboembolism heart attacks and strokes especially with hemoglobin is tolerated at higher doses.  I will plan to give him 40,000 units every 3 weeks.  Patient verbalized understanding.  If his anemia fails to improve despite 3 months of Retacrit I will consider proceeding with a bone marrow biopsy at that time  Follow-up instructions: As above  I discussed the assessment and treatment plan with the patient. The patient was provided an opportunity to ask questions and all were answered. The patient agreed with the plan and demonstrated an understanding of the instructions.   The patient was advised to call back or seek an in-person evaluation if the symptoms worsen or if the condition fails to improve as anticipated.  I provided 15 minutes of face-to-face video visit time during this encounter.  Time spent in reviewing prior labs and formulating future care plan for the patient.  Visit Diagnosis: 1. Normocytic anemia   2. B12 deficiency     Dr. Randa Evens, MD, MPH [facility] at [facility] Tel- [phone] 02/24/2022 11:05 AM

## 2022-02-24 NOTE — Telephone Encounter (Signed)
Brandi from [facility] called in asking for any recommendations to help treat patients wound that he has on both legs. She said that she called in Keflax for him ,and that he is intolerant of the uniboot due to the dressing rubbing up against it and causing skin to peel and bleed.

## 2022-02-25 NOTE — Telephone Encounter (Signed)
I think it makes sense to get him seen here whenever possible.  I put in the referral to wound and vascular clinic. Thanks.

## 2022-02-25 NOTE — Telephone Encounter (Signed)
[facility] unable to [facility].   Referral sent to [facility] to see if able to accept  See referral notes

## 2022-02-25 NOTE — Addendum Note (Signed)
Addended by: Tonia Ghent on: 02/25/2022 02:11 PM   Modules accepted: Orders

## 2022-02-25 NOTE — Telephone Encounter (Signed)
I spoke with [name] [name] at [facility] saw pt for routine visit that changed to acute visit on 02/24/22. Pt has redness, warm to touch and weeping both lower legs and small areas on legs that are bleeding also. Pt has stage 2 area on top of lt foot. [name] [name] said both lower legs appear burned; raw and weeping. Sat and Sun unaboot changed due to being drenched. [name] [name] said had to moisten areas when removing unaboot 02/24/22; was tearing the skin when trying to remove unaboot, advised pt daughter to leave unaboot off for now but applying the hydrocolloid dressing to top of lt foot. [name] [name] started Keflex 500 mg taking bid for 7 days on 02/24/22; [name] [name] requesting an acute visit with Dr Damita Dunnings and referral to vascular and wound care. [name] [name] said pts daughter works during the day but later today could bring pt for appt. Sending note to Dr Damita Dunnings and Janett Billow and will teams Janett Billow also. [name] [name] said we have the only contact # she has for [name] [name] pts daughter.

## 2022-02-25 NOTE — Telephone Encounter (Addendum)
Please triage patient and see what the wounds currently look like.  Please see what details you can get.  Thanks.

## 2022-02-25 NOTE — Telephone Encounter (Signed)
Daughter is aware of referral and made appt with Dr. Lorelei Pont tomorrow at 2:40 pm.

## 2022-02-25 NOTE — Telephone Encounter (Signed)
You do not have any opening until later in the week. Ok to schedule with another provider?

## 2022-02-26 ENCOUNTER — Encounter: Payer: Self-pay | Admitting: Family Medicine

## 2022-02-26 ENCOUNTER — Ambulatory Visit (INDEPENDENT_AMBULATORY_CARE_PROVIDER_SITE_OTHER): Payer: PPO | Admitting: Family Medicine

## 2022-02-26 VITALS — BP 110/60 | HR 85 | Temp 98.9°F | Ht 70.0 in | Wt 234.0 lb

## 2022-02-26 DIAGNOSIS — J9611 Chronic respiratory failure with hypoxia: Secondary | ICD-10-CM | POA: Diagnosis not present

## 2022-02-26 DIAGNOSIS — L97909 Non-pressure chronic ulcer of unspecified part of unspecified lower leg with unspecified severity: Secondary | ICD-10-CM

## 2022-02-26 DIAGNOSIS — I83009 Varicose veins of unspecified lower extremity with ulcer of unspecified site: Secondary | ICD-10-CM | POA: Diagnosis not present

## 2022-02-26 NOTE — Telephone Encounter (Signed)
Discussed with patient's daughter, wound and vascular clinic referral ordered.  Working on office visit here when possible.  His legs had improved until this past weekend when he had more skin changes.

## 2022-02-26 NOTE — Patient Instructions (Signed)
Take an extra dose of lasix (furosemide) when you get home.   I am happy that the Keflex seems to be helping.

## 2022-02-26 NOTE — Progress Notes (Signed)
[name] [name] T. [name] Biskup, MD, CAQ Sports Medicine [facility] at [facility] [address] [place], [address]  Phone: [phone]  FAX: [phone]  [name] [name] - 88 y.o. male  MRN [id number]  Date of Birth: [birth year]  Date: 02/26/2022  PCP: [name] Nam, MD  Referral: [name] Nam, MD  Chief Complaint  Patient presents with   Wound Check    Legs/Feet-Swelling and Weeping   Subjective:   [name] [name] is a 88 y.o. very pleasant male patient with Body mass index is 33.58 kg/m. who presents with the following:  I was asked to see the patient today to check on his legs by Dr. Para March.  He has an ulcer on the left lower extremity on the dorsum of his foot visualized in the image below.  He has extensive vascular changes on both of his legs.  His daughter is here who provides additional history.  They have been using Unna boots for management with the swelling, and unfortunately some of the Unna boot pulled off some of his skin.  Legs and feet are weeping.  Daughter was told this by some other family members, and they have been concerned and not placing his legs in a compressive wrapping over the last few days.  A nurse from [facility] came out to their house 2 days ago to visualize the patient, and when they saw his legs they prescribe some Keflex 500 mg p.o. twice daily.  At this point, the patient and his daughter both say that his legs look fairly bad, they were a lot more red, and they were weeping fluid.  Since then, he seems to have improved over the last day, and his legs do not hurt in the same way.  They have not been wrapping or putting his legs in any kind of compressive wraps.  They have been open to the air or wrapped loosely with gauze.  [facility] referred to wound care and vascular surgery 2 days ago.    Referral appointment to the wound care is in November 2023. Vascular is pending and the appointment has not been made as of yet.  Skin and  bloody with pulling the unna boots off.   They have been trying to arrange home health wound care, and there are many notes in the chart about this.  It looks as if [facility] refused to see him, and that is what the daughter tells to me. [facility] through insurance, but they are not HH.  Homehealth - wound management - RN to assess and for wound care  [facility] does oxygen [facility] through his insurance  [facility] has the referral for [facility]. - wound care is needed.   Review of Systems is noted in the HPI, as appropriate  Patient Active Problem List   Diagnosis Date Noted   Normocytic anemia 02/24/2022   Normochromic anemia 02/12/2022   Scalp lesion 02/12/2022   Constipation 02/12/2022   Skin inflammation 12/29/2021   Rash 06/17/2021   Pressure sore 06/17/2021   Olecranon bursitis 06/17/2021   Valvular heart disease 04/19/2021   Venous stasis ulcer (HCC) 03/13/2021   Heart block 01/17/2021   Blurry vision 11/07/2019   Healthcare maintenance 11/07/2019   Insomnia 04/01/2018   Myalgia 12/03/2016   Vitamin D deficiency 11/21/2016   Chronic heart failure with preserved ejection fraction (HFpEF) (HCC) 07/13/2016   AAA (abdominal aortic aneurysm) without rupture (HCC) 07/13/2016   Chronic obstructive pulmonary disease (HCC) 12/27/2015  Fecal urgency 09/14/2015   S/P TAVR (transcatheter aortic valve replacement) 06/26/2015   Severe aortic stenosis 05/15/2015   Medicare annual wellness visit, subsequent 05/21/2014   Advance care planning 05/21/2014   Skin irritation 12/20/2013   Risk for falls 12/20/2013   Rib pain on right side 01/29/2013   Vertigo 07/01/2012   Right knee DJD 10/31/2011   Knee pain 10/23/2011   OSA (obstructive sleep apnea) 07/31/2009   Chronic respiratory failure with hypoxia (HCC) 08/03/2008   Morbid obesity (HCC) 02/22/2008   POLYP, COLON 08/27/2007   Anxiety state 08/27/2007   COPD (chronic obstructive pulmonary disease) with emphysema (HCC)  08/27/2007   INTERNAL HEMORRHOIDS 03/23/2007   DIVERTICULOSIS, COLON 03/23/2007   SYMPTOM, PAIN, ABDOMINAL, GENERALIZED 02/18/2007   HYPERGLYCEMIA 02/18/2007   Essential hypertension 02/10/2007   NEOPLASM, SKIN, UNCERTAIN BEHAVIOR 12/04/2006   PEPTIC ULCER DISEASE WITH H-PYLORI  TX'D 11/19/2006   NEPHROLITHIASIS 11/19/2006   Backache 11/19/2006   BPH (benign prostatic hyperplasia) 11/19/2006    Past Medical History:  Diagnosis Date   AAA (abdominal aortic aneurysm) (HCC)    a. 3.8 cm CTA 05/2015 b. 3.9 cm by [place] in 11/2017   Anxiety    COPD (chronic obstructive pulmonary disease) (HCC)    on home O2 4LPM   Diverticulosis    Former smoker    quit 2001   Hemorrhoids    Kidney stones    passed spontaneously   OSA on CPAP    last study- in the home possibility- 2015, pt. unsure    Prediabetes    Hgb A1C 5.8 in Jan 2017   PUD (peptic ulcer disease)    Right knee DJD 10/31/2011   Severe   S/P TAVR (transcatheter aortic valve replacement) 06/26/2015   26 mm Edwards Sapien 3 transcatheter heart valve placed via percutaneous right transfemoral approach   Severe aortic stenosis    a. Echo 11/16:  Mod LVH, EF 55-60%, no RWMA, Gr 1 DD, severe AS (mean 51 mmHg; peak 86 mmHg), MAC, mild LAE  b. s/p TAVR on 06/26/2015 with 26mm Edward Sapien 3 THV   Shortness of breath dyspnea     Past Surgical History:  Procedure Laterality Date   CARDIAC CATHETERIZATION N/A 05/15/2015   Procedure: Right/Left Heart Cath and Coronary Angiography;  Surgeon: Tonny Bollman, MD;  Location[facility] INVASIVE CV LAB;  Service: Cardiovascular;  Laterality: N/A;   CHOLECYSTECTOMY     EYE SURGERY     bilateral cataracts removed, /w IOL   KNEE SURGERY Right 1985   arthrosopic -    TEE WITHOUT CARDIOVERSION N/A 06/26/2015   Procedure: TRANSESOPHAGEAL ECHOCARDIOGRAM (TEE);  Surgeon: Tonny Bollman, MD;  Location: [facility] OR;  Service: Open Heart Surgery;  Laterality: N/A;   TONSILLECTOMY     TRANSCATHETER AORTIC VALVE  REPLACEMENT, TRANSFEMORAL N/A 06/26/2015   Procedure: TRANSCATHETER AORTIC VALVE REPLACEMENT, TRANSFEMORAL;  Surgeon: Tonny Bollman, MD;  Location: [facility] OR;  Service: Open Heart Surgery;  Laterality: N/A;    Family History  Problem Relation Age of Onset   Stroke Mother    Heart attack Mother 88       deceased   Prostate cancer Father 29   Prostate cancer Brother    Colon cancer Neg Hx     Social History   Social History Narrative   Retired, print shop   Married 1958   1 daughter   Former smoker   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       Objective:   BP 110/60  Pulse 85   Temp 98.9 F (37.2 C) (Oral)   Ht 5\' 10"  (1.778 m)   Wt 234 lb (106.1 kg) Comment: Patient's estimate  SpO2 91% Comment: 5 L O2  BMI 33.58 kg/m   GEN: No acute distress; alert,appropriate.  Generally mildly unhealthy in appearance. PULM: Breathing comfortably in no respiratory distress PSYCH: Normally interactive.       Mildly warm to palpation on the lower legs  Laboratory and Imaging Data:  Assessment and Plan:     ICD-10-CM   1. Ulcer of lower extremity, unspecified laterality, unspecified ulcer stage (HCC)  L97.909     2. Venous stasis ulcer, unspecified site, unspecified ulcer stage, unspecified whether varicose veins present (HCC)  I83.009    L97.909     3. Chronic respiratory failure with hypoxia (HCC)  J96.11      Total encounter time: 39 minutes. This includes total time spent on the day of encounter.    Vasculopathic patient with a ulcer on the left dorsum of the foot with severe venous stasis disease and probable vascular disease.  Think that an upcoming vascular surgery appointment is also very good idea.  Wound is a great idea, unfortunately they are not going to see him until November.  Do think home health wound care will be very helpful for the patient and for his family.  It looks as if this is already been initiated.  I agree with Keflex for 1 week, and suspect  there may have been some superimposed infection.  I am going to communicate all this with the patient's primary care doctor, and he has been involved with this patient's lower extremities for some time.  It appears as if he is well acquainted with the situation.  Right now, I will have him leave his legs unwrapped, continue Keflex, and I am going to give him an extra dose of Lasix today to see if he can diuresis some additional fluid.  I left it open ended with the patient and his family regarding additional wrapping of the legs or Unna boots.  I would like to get the primary care doctor involved to help direct some of this longer term care.  Medication Management during today's office visit: No orders of the defined types were placed in this encounter.  There are no discontinued medications.  Orders placed today for conditions managed today: No orders of the defined types were placed in this encounter.   Disposition: No follow-ups on file.  Dr[employer] One speech-to-text software was used for transcription in this dictation.  Possible transcriptional errors can occur using Animal nutritionist.   Signed,  Elpidio Galea. Iqra Rotundo, MD   Outpatient Encounter Medications as of 02/26/2022  Medication Sig   albuterol (PROAIR HFA) 108 (90 Base) MCG/ACT inhaler INHALE 1-2 PUFFS INTO THE LUNGS EVERY 6 (SIX) HOURS AS NEEDED FOR WHEEZING.   aspirin EC 81 MG tablet Take 1 tablet (81 mg total) by mouth daily.   cyanocobalamin (VITAMIN B12) 1000 MCG/ML injection INJECT 1 ML (1,000 MCG TOTAL) INTO THE MUSCLE ONCE A WEEK.   furosemide (LASIX) 40 MG tablet TAKE 1 TABLET BY MOUTH EVERY DAY   Iron, Ferrous Sulfate, 325 (65 Fe) MG TABS Take 325 mg by mouth daily.   LORazepam (ATIVAN) 1 MG tablet TAKE 1/4 TO 1/2 TABLET BY MOUTH EVERY 6 HOURS AS NEEDED   OXYGEN Inhale into the lungs. 5 Liters   Respiratory Therapy Supplies (FLUTTER) DEVI Use as directed   SYMBICORT 160-4.5 MCG/ACT  inhaler TAKE 2 PUFFS BY MOUTH  TWICE A DAY   Syringe/Needle, Disp, (SYRINGE 3CC/25GX1") 25G X 1" 3 ML MISC 1 mL by Does not apply route once a week.   Wound Dressings (CURITY UNNA BOOT) MISC Dispense 4 rolls of unna boot wrap to use on legs with coban.   No facility-administered encounter medications on file as of 02/26/2022.

## 2022-02-28 ENCOUNTER — Telehealth: Payer: Self-pay | Admitting: Family Medicine

## 2022-02-28 NOTE — Telephone Encounter (Signed)
Please get update from family about the patient's legs.  I think it makes sense not to use the unna boot initially with ongoing abx treatment, mainly so they can inspect his legs daily.  Please update me about his legs early next week.  Would try to elevate legs in the meantime.  Please find out when he can see vascular clinic.  If he can't see them soon, then we may need to recheck him here next week.  Thanks.

## 2022-02-28 NOTE — Telephone Encounter (Signed)
Spoke with patients daughter Hilda Blades; okay per DPR. Patient is doing okay right now; legs are starting to improve and ulcer is looking better per Hilda Blades. She stated that there very little weeping. He is still on the Keflex and tolerating ok. Vascular has not called them yet; nor has [facility] for [facility]. She is hoping they will both call the first of the week.

## 2022-03-02 ENCOUNTER — Other Ambulatory Visit: Payer: Self-pay | Admitting: *Deleted

## 2022-03-02 DIAGNOSIS — D649 Anemia, unspecified: Secondary | ICD-10-CM

## 2022-03-02 DIAGNOSIS — E538 Deficiency of other specified B group vitamins: Secondary | ICD-10-CM

## 2022-03-02 NOTE — Telephone Encounter (Signed)
Please check on patient on Monday.  If they haven't heard from [facility] or vascular by then, please let me know.  Thanks.

## 2022-03-03 ENCOUNTER — Inpatient Hospital Stay: Payer: PPO

## 2022-03-03 VITALS — BP 134/65 | HR 77

## 2022-03-03 DIAGNOSIS — D649 Anemia, unspecified: Secondary | ICD-10-CM

## 2022-03-03 DIAGNOSIS — E538 Deficiency of other specified B group vitamins: Secondary | ICD-10-CM | POA: Diagnosis not present

## 2022-03-03 DIAGNOSIS — Z79899 Other long term (current) drug therapy: Secondary | ICD-10-CM | POA: Diagnosis not present

## 2022-03-03 LAB — HEMOGLOBIN AND HEMATOCRIT, BLOOD
HCT: 29.8 % — ABNORMAL LOW (ref 39.0–52.0)
Hemoglobin: 8.6 g/dL — ABNORMAL LOW (ref 13.0–17.0)

## 2022-03-03 MED ORDER — EPOETIN ALFA-EPBX 20000 UNIT/ML IJ SOLN
40000.0000 [IU] | INTRAMUSCULAR | Status: DC
  Administered 2022-03-03: 40000 [IU] via SUBCUTANEOUS
  Filled 2022-03-03: qty 2

## 2022-03-03 NOTE — Telephone Encounter (Signed)
Spoke to patient's daughter Hilda Blades and was advised that they have not heard anything from [facility] or vascular. Hilda Blades stated that the antibiotic is making a difference in his legs. Hilda Blades stated that his legs have not been weeping for the past 4 days.

## 2022-03-04 ENCOUNTER — Telehealth: Payer: Self-pay | Admitting: Family Medicine

## 2022-03-04 ENCOUNTER — Encounter: Payer: Self-pay | Admitting: Family Medicine

## 2022-03-04 NOTE — Telephone Encounter (Signed)
See other TE.

## 2022-03-04 NOTE — Telephone Encounter (Signed)
Would continue the abx as is. Please check with vascular and HH to see when they can see patient.  Thanks.

## 2022-03-04 NOTE — Telephone Encounter (Signed)
Spoke with Hilda Blades and she states they have not heard anything yet from vascular or HH. I called vascular and had to leave a message to see when patient will be contacted and scheduled. Called [facility] to see if they have gotten the referral; message is being sent around to their referral coordinator to see if anything else needs to be done. Someone from [facility] will call back.

## 2022-03-04 NOTE — Telephone Encounter (Signed)
[facility] called back and stated they can not take patient on right now. They do not have any staff in the [place] area at this time. I advised I would let the patients family know. Called family and updated them. Message sent to referrals to see if referral can be sent somewhere else.

## 2022-03-04 NOTE — Telephone Encounter (Signed)
Patients wife returned you call.

## 2022-03-04 NOTE — Telephone Encounter (Signed)
LMTCB

## 2022-03-05 NOTE — Telephone Encounter (Signed)
error 

## 2022-03-06 DIAGNOSIS — Z09 Encounter for follow-up examination after completed treatment for conditions other than malignant neoplasm: Secondary | ICD-10-CM | POA: Diagnosis not present

## 2022-03-06 DIAGNOSIS — Z872 Personal history of diseases of the skin and subcutaneous tissue: Secondary | ICD-10-CM | POA: Diagnosis not present

## 2022-03-10 ENCOUNTER — Telehealth: Payer: Self-pay | Admitting: Family Medicine

## 2022-03-10 DIAGNOSIS — L97909 Non-pressure chronic ulcer of unspecified part of unspecified lower leg with unspecified severity: Secondary | ICD-10-CM

## 2022-03-10 NOTE — Telephone Encounter (Signed)
Mary from [facility] called regarding pt's home health orders. [name] stated orders were sent to [facility] & pt was denied. [facility] stated pt was too far & there was no staff to go out to pt. [name] is requesting orders to be sent somewhere else. Possible [facility]? [name] stated pt needs HH ASAP. Call back # [phone] (secured).

## 2022-03-10 NOTE — Telephone Encounter (Signed)
Support pool disregard. Sending to referral pool.

## 2022-03-11 NOTE — Telephone Encounter (Signed)
Spoke with Sherri in referrals and she stated a new HH order has to be done as his has expired. She is working on trying to get him in with [facility] as soon as possible. Let me know once referral is done and I will let referral coordinator know.

## 2022-03-12 NOTE — Telephone Encounter (Signed)
Referral team has been notified.

## 2022-03-12 NOTE — Telephone Encounter (Signed)
I put in the new order.  Thanks.

## 2022-03-14 DIAGNOSIS — L89894 Pressure ulcer of other site, stage 4: Secondary | ICD-10-CM | POA: Diagnosis not present

## 2022-03-14 DIAGNOSIS — I872 Venous insufficiency (chronic) (peripheral): Secondary | ICD-10-CM | POA: Diagnosis not present

## 2022-03-14 DIAGNOSIS — L97921 Non-pressure chronic ulcer of unspecified part of left lower leg limited to breakdown of skin: Secondary | ICD-10-CM | POA: Diagnosis not present

## 2022-03-14 DIAGNOSIS — J449 Chronic obstructive pulmonary disease, unspecified: Secondary | ICD-10-CM | POA: Diagnosis not present

## 2022-03-14 DIAGNOSIS — G4733 Obstructive sleep apnea (adult) (pediatric): Secondary | ICD-10-CM | POA: Diagnosis not present

## 2022-03-14 DIAGNOSIS — Z4800 Encounter for change or removal of nonsurgical wound dressing: Secondary | ICD-10-CM | POA: Diagnosis not present

## 2022-03-14 DIAGNOSIS — I714 Abdominal aortic aneurysm, without rupture, unspecified: Secondary | ICD-10-CM | POA: Diagnosis not present

## 2022-03-14 DIAGNOSIS — I110 Hypertensive heart disease with heart failure: Secondary | ICD-10-CM | POA: Diagnosis not present

## 2022-03-14 DIAGNOSIS — E559 Vitamin D deficiency, unspecified: Secondary | ICD-10-CM | POA: Diagnosis not present

## 2022-03-14 DIAGNOSIS — I5033 Acute on chronic diastolic (congestive) heart failure: Secondary | ICD-10-CM | POA: Diagnosis not present

## 2022-03-14 DIAGNOSIS — F419 Anxiety disorder, unspecified: Secondary | ICD-10-CM | POA: Diagnosis not present

## 2022-03-14 DIAGNOSIS — J9611 Chronic respiratory failure with hypoxia: Secondary | ICD-10-CM | POA: Diagnosis not present

## 2022-03-14 DIAGNOSIS — D500 Iron deficiency anemia secondary to blood loss (chronic): Secondary | ICD-10-CM | POA: Diagnosis not present

## 2022-03-17 NOTE — Telephone Encounter (Signed)
See 03/10/2022 TE and 03/12/2022 Referral No faxes received yet from [facility] with SOC/Eval dates.  I have sent a message back to [place] for [facility] information so that the referral can be closed. See 03/12/2022 referral notes for updates.   Wynell Balloon  General - Sent a message to [place] to see if she can take the referral//ELEA  Sent a message to [place] to see if she can take the referral//ELEA  Linked:03/13/22 Reviewed: 07:18 AM     Wynell Balloon  03/14/22  11:30:22 AM  Per Marye Round she will take the referral and the [facility] will be seen over the weekend :-)

## 2022-03-19 NOTE — Telephone Encounter (Signed)
SOC was either 03/15/2022 or 03/16/2022  ----- Message -----  From: Jana Half  Sent: 03/17/2022   5:14 PM EDT  To: Virl Cagey, CMA  Subject: RE: SOC Update                                 Yes, patient was seen over the weekend

## 2022-03-19 NOTE — Telephone Encounter (Signed)
See referral notes.  PT accepted by [facility] over the weekend of 9/30-10/1  Nothing further needed.

## 2022-03-20 DIAGNOSIS — D500 Iron deficiency anemia secondary to blood loss (chronic): Secondary | ICD-10-CM | POA: Diagnosis not present

## 2022-03-20 DIAGNOSIS — I110 Hypertensive heart disease with heart failure: Secondary | ICD-10-CM | POA: Diagnosis not present

## 2022-03-20 DIAGNOSIS — Z4800 Encounter for change or removal of nonsurgical wound dressing: Secondary | ICD-10-CM | POA: Diagnosis not present

## 2022-03-20 DIAGNOSIS — I5033 Acute on chronic diastolic (congestive) heart failure: Secondary | ICD-10-CM | POA: Diagnosis not present

## 2022-03-20 DIAGNOSIS — J9611 Chronic respiratory failure with hypoxia: Secondary | ICD-10-CM | POA: Diagnosis not present

## 2022-03-20 DIAGNOSIS — L97921 Non-pressure chronic ulcer of unspecified part of left lower leg limited to breakdown of skin: Secondary | ICD-10-CM | POA: Diagnosis not present

## 2022-03-20 DIAGNOSIS — J449 Chronic obstructive pulmonary disease, unspecified: Secondary | ICD-10-CM | POA: Diagnosis not present

## 2022-03-20 DIAGNOSIS — G4733 Obstructive sleep apnea (adult) (pediatric): Secondary | ICD-10-CM | POA: Diagnosis not present

## 2022-03-20 DIAGNOSIS — F419 Anxiety disorder, unspecified: Secondary | ICD-10-CM | POA: Diagnosis not present

## 2022-03-20 DIAGNOSIS — I872 Venous insufficiency (chronic) (peripheral): Secondary | ICD-10-CM | POA: Diagnosis not present

## 2022-03-24 ENCOUNTER — Inpatient Hospital Stay: Payer: PPO | Attending: Oncology

## 2022-03-24 ENCOUNTER — Inpatient Hospital Stay: Payer: PPO

## 2022-03-24 VITALS — BP 116/69 | HR 80

## 2022-03-24 DIAGNOSIS — D649 Anemia, unspecified: Secondary | ICD-10-CM

## 2022-03-24 DIAGNOSIS — E538 Deficiency of other specified B group vitamins: Secondary | ICD-10-CM

## 2022-03-24 LAB — HEMOGLOBIN AND HEMATOCRIT, BLOOD
HCT: 31.9 % — ABNORMAL LOW (ref 39.0–52.0)
Hemoglobin: 9.5 g/dL — ABNORMAL LOW (ref 13.0–17.0)

## 2022-03-24 MED ORDER — EPOETIN ALFA-EPBX 40000 UNIT/ML IJ SOLN
40000.0000 [IU] | INTRAMUSCULAR | Status: DC
  Administered 2022-03-24: 40000 [IU] via SUBCUTANEOUS
  Filled 2022-03-24: qty 1

## 2022-03-27 NOTE — Telephone Encounter (Signed)
error 

## 2022-03-28 ENCOUNTER — Telehealth: Payer: Self-pay | Admitting: Family Medicine

## 2022-03-28 NOTE — Telephone Encounter (Signed)
Home Health verbal orders [place] Name: Latricia Heft [place] number: [id number]  Requesting Skilled nursing   Frequency: 2x a week for 3 weeks  Wound Care: Calcium alginate cover with super absorber and wrap with kerlix to both legs for swelling, edema, and open areas  Please forward to [facility] pool or providers CMA

## 2022-03-30 NOTE — Telephone Encounter (Signed)
Please give the order.  Thanks.   

## 2022-03-31 ENCOUNTER — Encounter: Payer: Self-pay | Admitting: Oncology

## 2022-03-31 NOTE — Telephone Encounter (Signed)
Vida Roller has been given verbal orders

## 2022-03-31 NOTE — Telephone Encounter (Signed)
LMTCB

## 2022-04-01 DIAGNOSIS — I872 Venous insufficiency (chronic) (peripheral): Secondary | ICD-10-CM | POA: Diagnosis not present

## 2022-04-04 ENCOUNTER — Telehealth: Payer: Self-pay | Admitting: Family Medicine

## 2022-04-04 ENCOUNTER — Other Ambulatory Visit: Payer: Self-pay | Admitting: Internal Medicine

## 2022-04-04 NOTE — Telephone Encounter (Signed)
Rx was already sent in by Dr. Saunders Revel at cardiology today. Called and notified Hilda Blades of this.

## 2022-04-04 NOTE — Telephone Encounter (Signed)
  Encourage patient to contact the pharmacy for refills or they can request refills through [facility]  Did the patient contact the pharmacy: yes   LAST APPOINTMENT DATE: 02/11/22  NEXT APPOINTMENT DATE:  MEDICATION:furosemide (LASIX) 40 MG tablet  Is the patient out of medication? yes  If not, how much is left?none  Is this a 90 day supply: yes  [id number] [place]:  [phone] Fax:  [phone]    Patient dosage was changed to taking 1 1/2 daily ,needs prescription to reflect new dosage amount  Let patient know to contact pharmacy at the end of the day to make sure medication is ready.  Please notify patient to allow 48-72 hours to process

## 2022-04-09 ENCOUNTER — Ambulatory Visit: Payer: PPO | Admitting: Primary Care

## 2022-04-09 ENCOUNTER — Encounter: Payer: Self-pay | Admitting: Primary Care

## 2022-04-09 VITALS — BP 118/78 | HR 88 | Temp 98.1°F | Ht 70.0 in | Wt 234.0 lb

## 2022-04-09 DIAGNOSIS — J438 Other emphysema: Secondary | ICD-10-CM

## 2022-04-09 DIAGNOSIS — G4733 Obstructive sleep apnea (adult) (pediatric): Secondary | ICD-10-CM | POA: Diagnosis not present

## 2022-04-09 DIAGNOSIS — J449 Chronic obstructive pulmonary disease, unspecified: Secondary | ICD-10-CM

## 2022-04-09 NOTE — Assessment & Plan Note (Addendum)
-   Stable; No recent exacerbations or hospitalizations. He has intermittent congestion and wheezing. Rare SABA use. Continue Symbicort 146mg two puffs morning and evening; prn Albuterol hfa 2 puffs 4-6 hours for breakthrough shortness of breath/wheezing. No changes to plan. He needs no refills today.

## 2022-04-09 NOTE — Assessment & Plan Note (Signed)
-   Stable; O2 dependent 5L 24/7

## 2022-04-09 NOTE — Progress Notes (Signed)
_0  ID: [name] [name], male    DOB: 23-Nov-1933, 88 y.o.   MRN: [id number]  Chief Complaint  Patient presents with   Follow-up    No CPAP for 3 months. On 5L of O2 constant. Some wheezing.     Referring provider: Tonia Ghent, MD  HPI: 88 year old male, former smoker. PMH significant for COPD, chronic respiratory failure, OSA, HTN, heart failure, severe aortic stenosis s/p TAVR. Patient of Dr. Halford Chessman, last seen on 01/05/20. Maintained on CPAP. O2 dependent on 5L.   04/09/2022 Patient presents today for overdue follow-up for COPD, chronic respiratory failure and OSA. Overall his daughter states that his breathing has been stable, there hasn't been much of a change since his last office visit in 2021. He is compliant with Symbicort 178mg two puffs twice daily. Uses Albuterol <2-3 times a week. He has some intermittent congestion and wheezing, rescue inhaler helps when he uses. His CPAP machine broke 3 months ago after falling off night stand, he was due for upgrade at that time. DME company is [employer]   [employer] download 08/27/20-09/25/20 Usage 23/30 days; 77% > 4 hours Average usage 7 hours 56 mins Pressure 14cm h20 Airleaks 67L/min AHI 15  Allergies  Allergen Reactions   Iohexol Hives and Swelling    Pt reports swelling, redness, hives, and blisters    Atorvastatin     REACTION: aches   Celecoxib     REACTION: rash   Clinoril [Sulindac]     REACTION: rash   Nitrofurantoin    Spiriva Handihaler [Tiotropium Bromide Monohydrate] Other (See Comments)    Dry mouth   Sulfa Antibiotics     Intolerant but unrecalled.     Immunization History  Administered Date(s) Administered   Influenza Split 06/17/2011, 02/23/2012   Influenza Whole 04/27/2006, 04/10/2009, 04/10/2010   Influenza, Seasonal, Injecte, Preservative Fre 06/13/2016   Influenza,inj,Quad PF,6+ Mos 03/17/2013, 04/24/2014, 03/17/2015, 07/15/2016, 03/26/2018   PFIZER(Purple Top)SARS-COV-2 Vaccination 07/18/2019,  08/08/2019, 05/16/2020   Pneumococcal Polysaccharide-23 02/15/1999, 10/20/2011   Td 02/05/2006, 01/08/2017   Zoster, Live 01/15/2009    Past Medical History:  Diagnosis Date   AAA (abdominal aortic aneurysm) [place]    a. 3.8 cm CTA 05/2015 b. 3.9 cm by [facility] 11/2017   Anxiety    COPD [place]ch[place] obstructive [place] disease) ([place]    on home O2 4LPM   Diverticulosis    Former smoker    quit 2001   Hemorrhoids    Kidney stones    passed spontaneously   OSA on CPAP    last study- in the home possibility- 2015, pt. unsure    Prediabetes    Hgb A1C 5.8 in Jan 2017   PUD (peptic ulcer disease)    Right knee DJD 10/31/2011   Severe   S/P TAVR (transcatheter aortic valve replacement) 06/26/2015   26 mm Edwards Sapien 3 transcatheter heart valve placed via percutaneous right transfemoral approach   Severe aortic stenosis    a. Echo 11/16:  Mod LVH, EF 55-60%, no RWMA, Gr 1 DD, severe AS (mean 51 mmHg; peak 86 mmHg), MAC, mild LAE  b. s/p TAVR on 06/26/2015 with 227mEdward Sapien 3 THV   Shortness of breath dyspnea     Tobacco History: Social History   Tobacco Use  Smoking Status Former   Packs/day: 1.50   Years: 53.00   Total pack years: 79.50   Types: Cigarettes   Quit date: 06/23/1999   Years since quitting: 22.8  Smokeless  Tobacco Former   Types: Snuff, Chew   Counseling given: Not Answered   Outpatient Medications Prior to Visit  Medication Sig Dispense Refill   albuterol (PROAIR HFA) 108 (90 Base) MCG/ACT inhaler INHALE 1-2 PUFFS INTO THE LUNGS EVERY 6 (SIX) HOURS AS NEEDED FOR WHEEZING. 8.5 g 5   aspirin EC 81 MG tablet Take 1 tablet (81 mg total) by mouth daily.     furosemide (LASIX) 40 MG tablet TAKE 1 TABLET BY MOUTH EVERY DAY 90 tablet 0   Iron, Ferrous Sulfate, 325 (65 Fe) MG TABS Take 325 mg by mouth daily.     LORazepam (ATIVAN) 1 MG tablet TAKE 1/4 TO 1/2 TABLET BY MOUTH EVERY 6 HOURS AS NEEDED 30 tablet 1   OXYGEN Inhale into the lungs. 5 Liters      Respiratory Therapy Supplies (FLUTTER) DEVI Use as directed 1 each 0   SYMBICORT 160-4.5 MCG/ACT inhaler TAKE 2 PUFFS BY MOUTH TWICE A DAY 30.6 each 3   Syringe/Needle, Disp, (SYRINGE 3CC/25GX1") 25G X 1" 3 ML MISC 1 mL by Does not apply route once a week. 100 each 1   cyanocobalamin (VITAMIN B12) 1000 MCG/ML injection INJECT 1 ML (1,000 MCG TOTAL) INTO THE MUSCLE ONCE A WEEK. (Patient not taking: Reported on 04/09/2022) 4 mL 0   Wound Dressings (CURITY UNNA BOOT) MISC Dispense 4 rolls of unna boot wrap to use on legs with coban. (Patient not taking: Reported on 04/09/2022) 4 each 2   No facility-administered medications prior to visit.   Review of Systems  Review of Systems  Constitutional: Negative.   HENT:  Positive for congestion.   Respiratory:  Negative for chest tightness, shortness of breath and wheezing.   Cardiovascular:  Positive for leg swelling.   Physical Exam  BP 118/78 (BP Location: Left Arm, Cuff Size: Normal)   Pulse 88   Temp 98.1 F (36.7 C)   Ht _0  (1.778 m)   Wt 234 lb (106.1 kg) Comment: copied weight from last visit. patient in a wheelchair  SpO2 91%   BMI 33.58 kg/m  Physical Exam Constitutional:      Appearance: Normal appearance.  HENT:     Head: Normocephalic and atraumatic.     Mouth/Throat:     Mouth: Mucous membranes are moist.     Pharynx: Oropharynx is clear.  Cardiovascular:     Rate and Rhythm: Normal rate and regular rhythm.  Pulmonary:     Effort: Pulmonary effort is normal.     Breath sounds: Normal breath sounds. No wheezing, rhonchi or rales.  Musculoskeletal:        General: Normal range of motion.     Right lower leg: Edema present.     Left lower leg: Edema present.  Skin:    General: Skin is warm and dry.  Neurological:     General: No focal deficit present.     Mental Status: He is alert and oriented to person, place, and time. Mental status is at baseline.  Psychiatric:        Mood and Affect: Mood normal.         Behavior: Behavior normal.        Thought Content: Thought content normal.        Judgment: Judgment normal.      Lab Results:  CBC    Component Value Date/Time   WBC 7.5 02/11/2022 1255   RBC 2.87 (L) 02/11/2022 1255   HGB 9.5 (L) 03/24/2022 1345  HCT 31.9 (L) 03/24/2022 1345   PLT 279.0 02/11/2022 1255   MCV 94.2 02/11/2022 1255   MCH 30.4 12/09/2021 1514   MCHC 32.8 02/11/2022 1255   RDW 14.6 02/11/2022 1255   LYMPHSABS 1.1 02/11/2022 1255   MONOABS 0.5 02/11/2022 1255   EOSABS 0.9 (H) 02/11/2022 1255   BASOSABS 0.1 02/11/2022 1255    BMET    Component Value Date/Time   NA 140 12/09/2021 1514   NA 146 (H) 05/20/2021 1533   K 3.7 12/09/2021 1514   CL 94 (L) 12/09/2021 1514   CO2 38 (H) 12/09/2021 1514   GLUCOSE 115 (H) 12/09/2021 1514   BUN 25 (H) 12/09/2021 1514   BUN 16 05/20/2021 1533   CREATININE 1.22 12/09/2021 1514   CREATININE 1.01 11/01/2021 1607   CALCIUM 8.4 (L) 12/09/2021 1514   GFRNONAA 58 (L) 12/09/2021 1514   GFRAA >60 12/03/2016 0435    BNP    Component Value Date/Time   BNP 18 11/01/2021 1607    ProBNP No results found for: "PROBNP"  Imaging: No results found.   Assessment & Plan:   COPD (chronic obstructive pulmonary disease) with emphysema ([address]) - Stable; No recent exacerbations or hospitalizations. He has intermittent congestion and wheezing. Rare SABA use. Continue Symbicort 132mg two puffs morning and evening; prn Albuterol hfa 2 puffs 4-6 hours for breakthrough shortness of breath/wheezing. No changes to plan. He needs no refills today.   OSA (obstructive sleep apnea) - CPAP machine broke 3 months ago. No recent compliance report d/t 3G shut off. We are placing an order for him to be provided with a new machine through his DME company [employer]   [place] obstructive [place] disease ([place] - Stable; O2 dependent 5L 24/7   EMartyn Ehrich NP 04/09/2022

## 2022-04-09 NOTE — Assessment & Plan Note (Signed)
-   CPAP machine broke 3 months ago. No recent compliance report d/t 3G shut off. We are placing an order for him to be provided with a new machine through his [place]

## 2022-04-09 NOTE — Patient Instructions (Addendum)
Recommendations: Continue Symbicort 160- take two puffs morning and evening Use albuterol 2 puffs every 4-6 for breakthrough shortness of breath and wheezing  Continue to wear 5L oxygen 24/7 Once you receive CPAP aim to wear every night 4-6 hours or longer  Orders: New CPAP at 14cm h20 and mask fitting with [employer]  Spirometry re: COPD   Follow-up: We will need to see you back within 90 days after getting back on CPAP for compliance check

## 2022-04-09 NOTE — Progress Notes (Signed)
Reviewed and agree with assessment/plan.   Chesley Mires, MD [facility] Pulmonary/Critical Care 04/09/2022, 4:52 PM Pager:  [phone]

## 2022-04-10 ENCOUNTER — Telehealth: Payer: Self-pay | Admitting: Family Medicine

## 2022-04-10 NOTE — Telephone Encounter (Signed)
Home Health verbal orders [place] Name: [facility]HAB[facility]  Callback number: [phone]  Requesting OT/PT/Skilled nursing/Social Work/Speech: PT  Reason:BALANCE,WALKING ,STRENGTH  Frequency: 2X WEEKS FOR 4  Please forward to [facility] pool or providers CMA

## 2022-04-11 NOTE — Telephone Encounter (Signed)
Spoke with Erline Levine and verbal orders were given.

## 2022-04-11 NOTE — Telephone Encounter (Signed)
Please give the order.  Thanks.   

## 2022-04-14 ENCOUNTER — Other Ambulatory Visit (INDEPENDENT_AMBULATORY_CARE_PROVIDER_SITE_OTHER): Payer: Self-pay | Admitting: Nurse Practitioner

## 2022-04-14 ENCOUNTER — Inpatient Hospital Stay: Payer: PPO

## 2022-04-14 VITALS — BP 120/70 | HR 86

## 2022-04-14 DIAGNOSIS — D649 Anemia, unspecified: Secondary | ICD-10-CM

## 2022-04-14 DIAGNOSIS — L97909 Non-pressure chronic ulcer of unspecified part of unspecified lower leg with unspecified severity: Secondary | ICD-10-CM

## 2022-04-14 DIAGNOSIS — E538 Deficiency of other specified B group vitamins: Secondary | ICD-10-CM

## 2022-04-14 LAB — HEMOGLOBIN AND HEMATOCRIT, BLOOD
HCT: 31.3 % — ABNORMAL LOW (ref 39.0–52.0)
Hemoglobin: 9.2 g/dL — ABNORMAL LOW (ref 13.0–17.0)

## 2022-04-14 MED ORDER — EPOETIN ALFA-EPBX 40000 UNIT/ML IJ SOLN
40000.0000 [IU] | INTRAMUSCULAR | Status: DC
  Administered 2022-04-14: 40000 [IU] via SUBCUTANEOUS
  Filled 2022-04-14: qty 1

## 2022-04-15 DIAGNOSIS — I872 Venous insufficiency (chronic) (peripheral): Secondary | ICD-10-CM | POA: Diagnosis not present

## 2022-04-16 ENCOUNTER — Ambulatory Visit (INDEPENDENT_AMBULATORY_CARE_PROVIDER_SITE_OTHER): Payer: PPO | Admitting: Nurse Practitioner

## 2022-04-16 ENCOUNTER — Other Ambulatory Visit (INDEPENDENT_AMBULATORY_CARE_PROVIDER_SITE_OTHER): Payer: Self-pay | Admitting: Nurse Practitioner

## 2022-04-16 ENCOUNTER — Ambulatory Visit (INDEPENDENT_AMBULATORY_CARE_PROVIDER_SITE_OTHER): Payer: PPO

## 2022-04-16 ENCOUNTER — Encounter (INDEPENDENT_AMBULATORY_CARE_PROVIDER_SITE_OTHER): Payer: Self-pay | Admitting: Nurse Practitioner

## 2022-04-16 VITALS — BP 133/74 | HR 77 | Resp 16

## 2022-04-16 DIAGNOSIS — I5032 Chronic diastolic (congestive) heart failure: Secondary | ICD-10-CM | POA: Diagnosis not present

## 2022-04-16 DIAGNOSIS — Z7689 Persons encountering health services in other specified circumstances: Secondary | ICD-10-CM | POA: Diagnosis not present

## 2022-04-16 DIAGNOSIS — I10 Essential (primary) hypertension: Secondary | ICD-10-CM | POA: Diagnosis not present

## 2022-04-16 DIAGNOSIS — L97909 Non-pressure chronic ulcer of unspecified part of unspecified lower leg with unspecified severity: Secondary | ICD-10-CM

## 2022-04-16 DIAGNOSIS — I83009 Varicose veins of unspecified lower extremity with ulcer of unspecified site: Secondary | ICD-10-CM | POA: Diagnosis not present

## 2022-04-17 DIAGNOSIS — I872 Venous insufficiency (chronic) (peripheral): Secondary | ICD-10-CM | POA: Diagnosis not present

## 2022-04-17 DIAGNOSIS — D500 Iron deficiency anemia secondary to blood loss (chronic): Secondary | ICD-10-CM | POA: Diagnosis not present

## 2022-04-17 DIAGNOSIS — F419 Anxiety disorder, unspecified: Secondary | ICD-10-CM | POA: Diagnosis not present

## 2022-04-17 DIAGNOSIS — J9611 Chronic respiratory failure with hypoxia: Secondary | ICD-10-CM | POA: Diagnosis not present

## 2022-04-17 DIAGNOSIS — I110 Hypertensive heart disease with heart failure: Secondary | ICD-10-CM | POA: Diagnosis not present

## 2022-04-17 DIAGNOSIS — Z4800 Encounter for change or removal of nonsurgical wound dressing: Secondary | ICD-10-CM | POA: Diagnosis not present

## 2022-04-17 DIAGNOSIS — I5033 Acute on chronic diastolic (congestive) heart failure: Secondary | ICD-10-CM | POA: Diagnosis not present

## 2022-04-17 DIAGNOSIS — L97921 Non-pressure chronic ulcer of unspecified part of left lower leg limited to breakdown of skin: Secondary | ICD-10-CM | POA: Diagnosis not present

## 2022-04-17 DIAGNOSIS — J449 Chronic obstructive pulmonary disease, unspecified: Secondary | ICD-10-CM | POA: Diagnosis not present

## 2022-04-17 DIAGNOSIS — G4733 Obstructive sleep apnea (adult) (pediatric): Secondary | ICD-10-CM | POA: Diagnosis not present

## 2022-04-18 ENCOUNTER — Encounter: Payer: PPO | Attending: Physician Assistant | Admitting: Physician Assistant

## 2022-04-18 DIAGNOSIS — Z8679 Personal history of other diseases of the circulatory system: Secondary | ICD-10-CM | POA: Insufficient documentation

## 2022-04-18 DIAGNOSIS — I890 Lymphedema, not elsewhere classified: Secondary | ICD-10-CM | POA: Diagnosis not present

## 2022-04-18 DIAGNOSIS — I352 Nonrheumatic aortic (valve) stenosis with insufficiency: Secondary | ICD-10-CM | POA: Diagnosis not present

## 2022-04-18 DIAGNOSIS — I110 Hypertensive heart disease with heart failure: Secondary | ICD-10-CM | POA: Diagnosis not present

## 2022-04-18 DIAGNOSIS — Z87891 Personal history of nicotine dependence: Secondary | ICD-10-CM | POA: Diagnosis not present

## 2022-04-18 DIAGNOSIS — I714 Abdominal aortic aneurysm, without rupture, unspecified: Secondary | ICD-10-CM | POA: Insufficient documentation

## 2022-04-18 DIAGNOSIS — J449 Chronic obstructive pulmonary disease, unspecified: Secondary | ICD-10-CM | POA: Diagnosis not present

## 2022-04-18 DIAGNOSIS — L97828 Non-pressure chronic ulcer of other part of left lower leg with other specified severity: Secondary | ICD-10-CM | POA: Diagnosis not present

## 2022-04-18 DIAGNOSIS — I5042 Chronic combined systolic (congestive) and diastolic (congestive) heart failure: Secondary | ICD-10-CM | POA: Diagnosis not present

## 2022-04-18 DIAGNOSIS — L97818 Non-pressure chronic ulcer of other part of right lower leg with other specified severity: Secondary | ICD-10-CM | POA: Diagnosis not present

## 2022-04-20 DIAGNOSIS — Z6833 Body mass index (BMI) 33.0-33.9, adult: Secondary | ICD-10-CM

## 2022-04-20 DIAGNOSIS — I110 Hypertensive heart disease with heart failure: Secondary | ICD-10-CM | POA: Diagnosis not present

## 2022-04-20 DIAGNOSIS — I350 Nonrheumatic aortic (valve) stenosis: Secondary | ICD-10-CM

## 2022-04-20 DIAGNOSIS — J9611 Chronic respiratory failure with hypoxia: Secondary | ICD-10-CM | POA: Diagnosis not present

## 2022-04-20 DIAGNOSIS — I714 Abdominal aortic aneurysm, without rupture, unspecified: Secondary | ICD-10-CM | POA: Diagnosis not present

## 2022-04-20 DIAGNOSIS — I5033 Acute on chronic diastolic (congestive) heart failure: Secondary | ICD-10-CM | POA: Diagnosis not present

## 2022-04-20 DIAGNOSIS — Z4800 Encounter for change or removal of nonsurgical wound dressing: Secondary | ICD-10-CM | POA: Diagnosis not present

## 2022-04-20 DIAGNOSIS — Z9981 Dependence on supplemental oxygen: Secondary | ICD-10-CM

## 2022-04-20 DIAGNOSIS — G4733 Obstructive sleep apnea (adult) (pediatric): Secondary | ICD-10-CM | POA: Diagnosis not present

## 2022-04-20 DIAGNOSIS — Z954 Presence of other heart-valve replacement: Secondary | ICD-10-CM

## 2022-04-20 DIAGNOSIS — L97921 Non-pressure chronic ulcer of unspecified part of left lower leg limited to breakdown of skin: Secondary | ICD-10-CM | POA: Diagnosis not present

## 2022-04-20 DIAGNOSIS — F419 Anxiety disorder, unspecified: Secondary | ICD-10-CM | POA: Diagnosis not present

## 2022-04-20 DIAGNOSIS — E559 Vitamin D deficiency, unspecified: Secondary | ICD-10-CM | POA: Diagnosis not present

## 2022-04-20 DIAGNOSIS — I872 Venous insufficiency (chronic) (peripheral): Secondary | ICD-10-CM | POA: Diagnosis not present

## 2022-04-20 DIAGNOSIS — Z7982 Long term (current) use of aspirin: Secondary | ICD-10-CM

## 2022-04-20 DIAGNOSIS — D500 Iron deficiency anemia secondary to blood loss (chronic): Secondary | ICD-10-CM | POA: Diagnosis not present

## 2022-04-20 DIAGNOSIS — J449 Chronic obstructive pulmonary disease, unspecified: Secondary | ICD-10-CM | POA: Diagnosis not present

## 2022-04-20 DIAGNOSIS — Z87891 Personal history of nicotine dependence: Secondary | ICD-10-CM

## 2022-04-21 LAB — AEROBIC CULTURE

## 2022-04-21 NOTE — Progress Notes (Signed)
04/18/2022 8:45 A M Medical Record Number: [id number] Patient Account Number: [id number] Date of Birth/Sex: Treating RN: 01/20/1934 (88 y.o. [name] [name]) Carlene Coria Primary Care Yosiah Jasmin: [name] Other Clinician: Referring Drina Jobst: Treating Nakhia Levitan/Extender: Fara Chute Weeks in Treatment: 0 Active Inactive Wound/Skin Impairment Nursing Diagnoses: Knowledge deficit related to ulceration/compromised skin integrity Goals: Patient/caregiver will verbalize understanding of skin care regimen Date Initiated: 04/18/2022 Target Resolution Date: 05/18/2022 Goal Status: Active Ulcer/skin breakdown will have a volume reduction of 30% by week 4 Date Initiated: 04/18/2022 Target Resolution Date: 05/18/2022 Goal Status: Active [name] [name], [name] [name] ([id number]) [id number] Page 6 of 8 Ulcer/skin breakdown will have a volume reduction of 50% by week 8 Date Initiated: 04/18/2022 Target Resolution Date: 06/18/2022 Goal Status: Active Ulcer/skin breakdown will have a volume reduction of 80% by week 12 Date Initiated: 04/18/2022 Target Resolution Date:  07/19/2022 Goal Status: Active Ulcer/skin breakdown will heal within 14 weeks Date Initiated: 04/18/2022 Target Resolution Date: 08/17/2022 Goal Status: Active Interventions: Assess patient/caregiver ability to obtain necessary supplies Assess patient/caregiver ability to perform ulcer/skin care regimen upon admission and as needed Assess ulceration(s) every visit Notes: Electronic Signature(s) Signed: 04/21/2022 9:30:18 AM By: Carlene Coria RN Entered By: Carlene Coria on 04/18/2022 10:14:42 -------------------------------------------------------------------------------- Pain Assessment Details Patient Name: Date of Service: CO MBS, [place]. 04/18/2022 8:45 A M Medical Record Number: [id number] Patient Account Number: [id number] Date of Birth/Sex: Treating RN: [birth year] (88 y.o. [name] [name] Primary Care Jozelyn Kuwahara: [name] Other Clinician: Referring Deja Kaigler: Treating Linet Brash/Extender: Fara Chute Weeks in Treatment: 0 Active Problems Location of Pain Severity and Description of Pain Patient Has Paino No Site Locations Pain Management and Medication Current Pain Management: Electronic Signature(s) Signed: 04/21/2022 9:30:18 AM By: Carlene Coria RN Entered By: Carlene Coria on 04/18/2022 08:52:46 Bagnell, [name] [name] ([id number]) [id number] Page 7 of 8 -------------------------------------------------------------------------------- Patient/Caregiver Education Details Patient Name: Date of Service: CO MBS, [place]. 11/3/2023andnbsp8:45 A M Medical Record Number: [id number] Patient Account Number: [id number] Date of Birth/Gender: Treating RN: [birth year] (88 y.o. [name] [name]) Carlene Coria Primary Care Physician: [name] Other Clinician: Referring Physician: Treating Physician/Extender: Dayna Barker in Treatment: 0 Education Assessment Education Provided To: Patient Education Topics Provided Wound/Skin Impairment: Methods:  Explain/Verbal Responses: State content correctly Electronic Signature(s) Signed: 04/21/2022 9:30:18 AM By: Carlene Coria RN Entered By: Carlene Coria on 04/18/2022 11:35:41 -------------------------------------------------------------------------------- Vitals Details Patient Name: Date of Service: CO MBS, [place] HN H. 04/18/2022 8:45 A M Medical Record Number: [id number] Patient Account Number: [id number] Date of Birth/Sex: Treating RN: [birth year] (88 y.o. [name] [name] Primary Care Seville Downs: [name] Other Clinician: Referring Taesha Goodell: Treating Consetta Cosner/Extender: Fara Chute Weeks in Treatment: 0 Vital Signs Time Taken: 08:52 Temperature (F): 98.2 Height (in): 73 Pulse (bpm): 73 Source: Stated Respiratory Rate (breaths/min): 20 Weight (lbs): 226 Blood Pressure (mmHg): 151/76 Source: Stated Reference Range: 80 - 120 mg / dl Body Mass Index (BMI): 29.8 Electronic Signature(s) Signed: 04/21/2022 9:30:18 AM By: Carlene Coria RN [name] [name] ([id number]) [id number] Page 8 of 8 Entered By: Carlene Coria on 04/18/2022 09:03:09  Imaging (photographs - any number of wounds) '[]'$  - 0 Wound Tracing (instead of photographs) '[]'$  - 0 Simple Wound Measurement - one wound '[]'$  - 0 Complex Wound Measurement - multiple wounds '[]'$  - 0 Simple Wound Cleansing - one wound '[]'$  - 0 Complex Wound Cleansing - multiple wounds INTERVENTIONS - Wound Dressings '[]'$  - 0 Small Wound Dressing one or multiple wounds '[]'$  - 0 Medium Wound Dressing one or multiple wounds '[]'$  - 0 Large Wound Dressing one or multiple wounds '[]'$  - 0 Application of Medications - injection INTERVENTIONS - Miscellaneous '[]'$  - 0 External ear exam '[]'$  - 0 Specimen Collection (cultures, biopsies, blood, body fluids, etc.) '[]'$  - 0 Specimen(s) / Culture(s) sent or taken to Lab for analysis '[]'$  - 0 Patient Transfer (multiple staff / Civil Service fast streamer / Similar devices) '[]'$  - 0 Simple Staple / Suture removal (25 or less) '[]'$  - 0 Complex Staple / Suture removal (26 or more) '[]'$  - 0 Hypo / Hyperglycemic Management (close monitor of Blood Glucose) '[]'$  - 0 Ankle / Brachial Index (ABI) - do not check if billed separately Has the patient been seen at the hospital within the last three years: Yes Total Score: 80 Level Of Care: New/Established - Level 3 Electronic Signature(s) Signed: 04/21/2022 9:30:18 AM By: Carlene Coria RN Entered By: Carlene Coria on 04/18/2022 11:35:25 -------------------------------------------------------------------------------- Encounter Discharge Information Details Patient Name: Date of Service: CO MBS, [place] HN H. 04/18/2022 8:45 A M Medical Record Number:  [id number] Patient Account Number: [id number] Date of Birth/Sex: Treating RN: 1934/03/07 (88 y.o. [name] [name] Primary Care Renatha Rosen: [name] Other Clinician: Referring Shaneil Yazdi: Treating [name] [name]/Extender: Dayna Barker in Treatment: [address] [name] [name], [name] [name] ([id number]) [phone].pdf Page 4 of 8 Encounter Discharge Information Items Discharge Condition: Stable Ambulatory Status: Wheelchair Discharge Destination: Home Transportation: Private Auto Accompanied By: daughter Schedule Follow-up Appointment: Yes Clinical Summary of Care: Electronic Signature(s) Signed: 04/18/2022 11:38:36 AM By: Carlene Coria RN Entered By: Carlene Coria on 04/18/2022 11:38:36 -------------------------------------------------------------------------------- Lower Extremity Assessment Details Patient Name: Date of Service: CO MBS, [place] H. 04/18/2022 8:45 A M Medical Record Number: [id number] Patient Account Number: [id number] Date of Birth/Sex: Treating RN: 15-Oct-1933 (88 y.o. [name] [name] Primary Care Radwan Cowley: [name] Other Clinician: Referring Amely Voorheis: Treating Annamaria Salah/Extender: Fara Chute Weeks in Treatment: 0 Edema Assessment Assessed: Shirlyn Goltz: No] [Right: No] Edema: [Left: Yes] [Right: Yes] Calf Left: Right: Point of Measurement: 32 cm From Medial Instep 32 cm 36 cm Ankle Left: Right: Point of Measurement: 10 cm From Medial Instep 22 cm 22 cm Knee To Floor Left: Right: From Medial Instep 45 cm 45 cm Vascular Assessment Pulses: Dorsalis Pedis Palpable: [Left:Yes] [Right:Yes] Electronic Signature(s) Signed: 04/21/2022 9:30:18 AM By: Carlene Coria RN Entered By: Carlene Coria on 04/18/2022 09:27:54 [name] [name] ([id number]) [id number] Page 5 of 8 -------------------------------------------------------------------------------- Multi Wound Chart Details Patient Name: Date of Service: CO MBS, [place] H.  04/18/2022 8:45 A M Medical Record Number: [id number] Patient Account Number: [id number] Date of Birth/Sex: Treating RN: July 04, 1933 (88 y.o. [name] [name]) Carlene Coria Primary Care Jesusita Jocelyn: [name] Other Clinician: Referring Sherley Leser: Treating Shatarra Wehling/Extender: Fara Chute Weeks in Treatment: 0 Vital Signs Height(in): 73 Pulse(bpm): 73 Weight(lbs): 226 Blood Pressure(mmHg): 151/76 Body Mass Index(BMI): 29.8 Temperature(F): 98.2 Respiratory Rate(breaths/min): 20 [Treatment Notes:Wound Assessments Treatment Notes] Electronic Signature(s) Signed: 04/21/2022 9:30:18 AM By: Carlene Coria RN Entered By: Carlene Coria on 04/18/2022 10:14:51 -------------------------------------------------------------------------------- Multi-Disciplinary Care Plan Details Patient Name: Date of Service: CO MBS, [place] [facility] H.  [name] [name], [name] [name] ([id number]) [id number] Page 1 of 8 Visit Report for 04/18/2022 Allergy List Details Patient Name: Date of Service: CO MBS, [place] H. 04/18/2022 8:45 A M Medical Record Number: [id number] Patient Account Number: [id number] Date of Birth/Sex: Treating RN: 10-02-1933 (88 y.o. [name] [name]) Carlene Coria Primary Care Nekita Pita: [name] Other Clinician: Referring Reyan Helle: Treating Raidyn Breiner/Extender: Fara Chute Weeks in Treatment: 0 Allergies Active Allergies Sulfa (Sulfonamide Antibiotics) iohexol celecoxib nitrofurantoin Spiriva with HandiHaler atorvastatin Allergy Notes Electronic Signature(s) Signed: 04/21/2022 9:30:18 AM By: Carlene Coria RN Entered By: Carlene Coria on 04/18/2022 09:08:00 -------------------------------------------------------------------------------- Arrival Information Details Patient Name: Date of Service: CO MBS, [place]. 04/18/2022 8:45 A M Medical Record Number: [id number] Patient Account Number: [id number] Date of Birth/Sex: Treating RN: [birth year] (88 y.o. [name] [name] Primary Care Jaquez Farrington: [name] Other Clinician: Referring Koraline Phillipson: Treating Kairos Panetta/Extender: Dayna Barker in Treatment: 0 Visit Information Patient Arrived: Ambulatory Arrival Time: 08:51 Accompanied By: daughter Transfer Assistance: None [name] [name], [name] [name] ([id number]) [id number] Page 2 of 8 Patient Identification Verified: Yes Secondary Verification Process Completed: Yes Patient Requires Transmission-Based Precautions: No Patient Has Alerts: Yes Patient Alerts: ABI R 1.10 TBI.60 04/16/22 ABI L 1.05 TBI .48 11/1/2 Electronic Signature(s) Signed: 04/21/2022 9:30:18 AM By: Carlene Coria RN Entered By: Carlene Coria on 04/18/2022 09:29:35 -------------------------------------------------------------------------------- Clinic Level of Care Assessment Details Patient Name: Date of  Service: CO MBS, [place] H. 04/18/2022 8:45 A M Medical Record Number: [id number] Patient Account Number: [id number] Date of Birth/Sex: Treating RN: July 08, 1933 (88 y.o. [name] [name]) Carlene Coria Primary Care Xayla Puzio: [name] Other Clinician: Referring Teagyn Fishel: Treating Shanna Strength/Extender: Fara Chute Weeks in Treatment: 0 Clinic Level of Care Assessment Items TOOL 2 Quantity Score X- 1 0 Use when only an EandM is performed on the INITIAL visit ASSESSMENTS - Nursing Assessment / Reassessment X- 1 20 General Physical Exam (combine w/ comprehensive assessment (listed just below) when performed on new pt. evals) X- 1 25 Comprehensive Assessment (HX, ROS, Risk Assessments, Wounds Hx, etc.) ASSESSMENTS - Wound and Skin A ssessment / Reassessment '[]'$  - 0 Simple Wound Assessment / Reassessment - one wound '[]'$  - 0 Complex Wound Assessment / Reassessment - multiple wounds '[]'$  - 0 Dermatologic / Skin Assessment (not related to wound area) ASSESSMENTS - Ostomy and/or Continence Assessment and Care '[]'$  - 0 Incontinence Assessment and Management '[]'$  - 0 Ostomy Care Assessment and Management (repouching, etc.) PROCESS - Coordination of Care '[]'$  - 0 Simple Patient / Family Education for ongoing care '[]'$  - 0 Complex (extensive) Patient / Family Education for ongoing care X- 1 10 Staff obtains Programmer, systems, Records, T Results / Process Orders est '[]'$  - 0 Staff telephones HHA, Nursing Homes / Clarify orders / etc '[]'$  - 0 Routine Transfer to another Facility (non-emergent condition) '[]'$  - 0 Routine Hospital Admission (non-emergent condition) X- 1 15 New Admissions / Biomedical engineer / Ordering NPWT Apligraf, etc. , '[]'$  - 0 Emergency Hospital Admission (emergent condition) X- 1 10 Simple Discharge Coordination '[]'$  - 0 Complex (extensive) Discharge Coordination PROCESS - Special Needs '[]'$  - 0 Pediatric / Minor Patient Management [name] [name], [name] [name] ([id number])  [id number] Page 3 of 8 '[]'$  - 0 Isolation Patient Management '[]'$  - 0 Hearing / Language / Visual special needs '[]'$  - 0 Assessment of Community assistance (transportation, D/C planning, etc.) '[]'$  - 0 Additional assistance / Altered mentation '[]'$  - 0 Support Surface(s) Assessment (bed, cushion, seat, etc.) INTERVENTIONS - Wound Cleansing / Measurement '[]'$  - 0 Wound

## 2022-04-21 NOTE — Progress Notes (Signed)
[name] [name], [name] [name] ([id number]) [id number] Page 1 of 5 Visit Report for 04/18/2022 Abuse Risk Screen Details Patient Name: Date of Service: [name] [name], [name] [name]. 04/18/2022 8:45 A M Medical Record Number: [id number] Patient Account Number: [id number] Date of Birth/Sex: Treating RN: 17-Oct-1933 (88 y.o. Jerilynn Mages) Carlene Coria Primary Care Colsen Modi: [name] Other Clinician: Referring Bhavesh Vazquez: Treating Cedar Ditullio/Extender: Fara Chute Weeks in Treatment: 0 Abuse Risk Screen Items Answer ABUSE RISK SCREEN: Has anyone close to you tried to hurt or harm you recentlyo No Do you feel uncomfortable with anyone in your familyo No Has anyone forced you do things that you didnt want to doo No Electronic Signature(s) Signed: 04/21/2022 9:30:18 AM By: Carlene Coria RN Entered By: Carlene Coria on 04/18/2022 09:10:48 -------------------------------------------------------------------------------- Activities of Daily Living Details Patient Name: Date of Service: [name] Fredirick [place] [name]. 04/18/2022 8:45 A M Medical Record Number: [id number] Patient Account Number: [id number] Date of Birth/Sex: Treating RN: [birth year] (88 y.o. Oval Linsey Primary Care Basia Mcginty: [name] Other Clinician: Referring Patricia Perales: Treating Mitchell Epling/Extender: Fara Chute Weeks in Treatment: 0 Activities of Daily Living Items Answer Activities of Daily Living (Please select one for each item) Drive Automobile Not Able T Medications ake Need Assistance Use T elephone Completely Able Care for Appearance Completely Able Use T oilet Completely Able Bath / Shower Completely Able Dress Self Completely Able Feed Self Completely Able Walk Need Assistance Get In / Out Bed Completely Able Housework Not Able ADOM, [facility] ([id number]) [phone] [email or web address] Page 2 of 5 Prepare Meals Not Able Handle Money Need Assistance Shop for Self Need  Assistance Electronic Signature(s) Signed: 04/21/2022 9:30:18 AM By: Carlene Coria RN Entered By: Carlene Coria on 04/18/2022 09:12:07 -------------------------------------------------------------------------------- Education Screening Details Patient Name: Date of Service: [name] [name], [name] [name]. 04/18/2022 8:45 A M Medical Record Number: [id number] Patient Account Number: [id number] Date of Birth/Sex: Treating RN: 10-Oct-1933 (88 y.o. Oval Linsey Primary Care Archana Eckman: [name] Other Clinician: Referring Nena Hampe: Treating Sonora Catlin/Extender: Dayna Barker in Treatment: 0 Primary Learner Assessed: Patient Learning Preferences/Education Level/Primary Language Learning Preference: Explanation Highest Education Level: Grade School Preferred Language: English Cognitive Barrier Language Barrier: No Translator Needed: No Memory Deficit: No Emotional Barrier: No Cultural/Religious Beliefs Affecting Medical Care: No Physical Barrier Impaired Vision: Yes Glasses Impaired Hearing: No Decreased Hand dexterity: No Knowledge/Comprehension Knowledge Level: Medium Comprehension Level: High Ability to understand written instructions: High Ability to understand verbal instructions: High Motivation Anxiety Level: Anxious Cooperation: Cooperative Education Importance: Acknowledges Need Interest in Health Problems: Asks Questions Perception: Coherent Willingness to Engage in Self-Management High Activities: Readiness to Engage in Self-Management High Activities: Electronic Signature(s) Signed: 04/21/2022 9:30:18 AM By: Carlene Coria RN Entered By: Carlene Coria on 04/18/2022 [name] [name], [name] [name] ([id number]) [phone] [email or web address] Page 3 of 5 -------------------------------------------------------------------------------- Fall Risk Assessment Details Patient Name: Date of Service: [name] [name], [name] [name]. 04/18/2022 8:45 A M Medical Record Number:  [id number] Patient Account Number: [id number] Date of Birth/Sex: Treating RN: 09-Jan-1934 (88 y.o. Jerilynn Mages) Carlene Coria Primary Care Andreas Sobolewski: [name] Other Clinician: Referring Mohab Ashby: Treating Adin Lariccia/Extender: Dayna Barker in Treatment: 0 Fall Risk Assessment Items Have you had 2 or more falls in the last 12 monthso 0 No Have you had any fall that resulted in injury in the last 12 monthso 0 No FALLS RISK SCREEN History of falling - immediate or within 3 months 0 No Secondary diagnosis (Do you have 2 or more medical diagnoseso) 0 No  Ambulatory aid None/bed rest/wheelchair/nurse 0 No Crutches/cane/walker 15 Yes Furniture 0 No Intravenous therapy Access/Saline/Heparin Lock 0 No Gait/Transferring Normal/ bed rest/ wheelchair 0 No Weak (short steps with or without shuffle, stooped but able to lift head while walking, may seek 10 Yes support from furniture) Impaired (short steps with shuffle, may have difficulty arising from chair, head down, impaired 0 No balance) Mental Status Oriented to own ability 0 No Electronic Signature(s) Signed: 04/21/2022 9:30:18 AM By: Carlene Coria RN Entered By: Carlene Coria on 04/18/2022 09:13:51 -------------------------------------------------------------------------------- Foot Assessment Details Patient Name: Date of Service: [name] [name], [name]. 04/18/2022 8:45 A M Medical Record Number: [id number] Patient Account Number: [id number] Date of Birth/Sex: Treating RN: [birth year] (88 y.o. Oval Linsey Primary Care Bria Sparr: [name] Other Clinician: Referring Abria Vannostrand: Treating Jourdyn Ferrin/Extender: Fara Chute Weeks in Treatment: 0 Foot Assessment Items Site Locations [name] [name], [name] [name] ([id number]) [phone] [id number] Page 4 of 5 + = Sensation present, - = Sensation absent, C = Callus, U = Ulcer R = Redness, W = Warmth, M = Maceration, PU = Pre-ulcerative lesion F = Fissure, S =  Swelling, D = Dryness Assessment Right: Left: Other Deformity: No No Prior Foot Ulcer: No No Prior Amputation: No No Charcot Joint: No No Ambulatory Status: Ambulatory Without Help Gait: Steady Electronic Signature(s) Signed: 04/21/2022 9:30:18 AM By: Carlene Coria RN Entered By: Carlene Coria on 04/18/2022 09:25:38 -------------------------------------------------------------------------------- Nutrition Risk Screening Details Patient Name: Date of Service: [name] [name], [name] [name]. 04/18/2022 8:45 A M Medical Record Number: [id number] Patient Account Number: [id number] Date of Birth/Sex: Treating RN: March 12, 1934 (88 y.o. Jerilynn Mages) Carlene Coria Primary Care Nela Bascom: [name] Other Clinician: Referring Omya Winfield: Treating Mystic Labo/Extender: Fara Chute Weeks in Treatment: 0 Height (in): 73 Weight (lbs): 226 Body Mass Index (BMI): 29.8 Nutrition Risk Screening Items Score Screening NUTRITION RISK SCREEN: I have an illness or condition that made me change the kind and/or amount of food I eat 2 Yes I eat fewer than two meals per day 0 No I eat few fruits and vegetables, or milk products 0 No I have three or more drinks of beer, liquor or wine almost every day 0 No I have tooth or mouth problems that make it hard for me to eat 0 No I don't always have enough money to buy the food I need 0 No [name] [name], [name] [name] ([id number]) [phone] [id number] Page 5 of 5 I eat alone most of the time 0 No I take three or more different prescribed or over-the-counter drugs a day 1 Yes Without wanting to, I have lost or gained 10 pounds in the last six months 0 No I am not always physically able to shop, cook and/or feed myself 2 Yes Nutrition Protocols Good Risk Protocol Moderate Risk Protocol 0 Provide education on nutrition High Risk Proctocol Risk Level: Moderate Risk Score: 5 Electronic Signature(s) Signed: 04/21/2022 9:30:18 AM By: Carlene Coria RN Entered By: Carlene Coria on 04/18/2022 09:14:10

## 2022-04-21 NOTE — Progress Notes (Addendum)
[name], BROTHERS ([id number]) [id number] Page 1 of 7 Visit Report for 04/18/2022 Chief Complaint Document Details Patient Name: Date of Service: [name] MBS, [place] H. 04/18/2022 8:45 A M Medical Record Number: [id number] Patient Account Number: [id number] Date of Birth/Sex: Treating RN: [birth year] (88 y.o. [name] [name]) Carlene Coria Primary Care Provider: [name] Other Clinician: Referring Provider: Treating Provider/Extender: Fara Chute Weeks in Treatment: 0 Information Obtained from: Patient Chief Complaint Bilateral LE Lymphedema Electronic Signature(s) Signed: 04/18/2022 10:10:15 AM By: Worthy Keeler PA-C Entered By: Worthy Keeler on 04/18/2022 10:10:15 -------------------------------------------------------------------------------- HPI Details Patient Name: Date of Service: [name] MBS, [place]. 04/18/2022 8:45 A M Medical Record Number: [id number] Patient Account Number: [id number] Date of Birth/Sex: Treating RN: 1933/12/04 (88 y.o. [name] [name] Primary Care Provider: [name] Other Clinician: Referring Provider: Treating Provider/Extender: Fara Chute Weeks in Treatment: 0 History of Present Illness HPI Description: 04-18-2022 upon evaluation today patient appears for initial evaluation in the clinic though apparently I have taking care of his wife previous. I did look her up and I do remember her in fact. The good news is she is healed and still doing well as far as I am aware I did not ask specifically. With that being said the patient unfortunately is having issues with bilateral lower extremity lymphedema which is something that has been quite significant for him it sounds. He has been seen by [facility] who referred him to [place]. The goal based on review of those notes is good to be to order and lymphedema pumps. With that being said he is going to need some counter compression and we discussed that to some  degree today as well. He did have a 3 layer compression wrap unfortunately when they put Unna boot wrap on him that caused a significant reaction that they think may have been allergic and therefore that has been avoided at this point. The patient's had issues with his lower extremities and swelling for quite some time. Patient does have a history of lymphedema, hypertension, congestive heart failure, COPD, abdominal aortic aneurysm, and aortic valve stenosis. Electronic Signature(s) Signed: 04/18/2022 1:31:10 PM By: Worthy Keeler PA-C Entered By: Worthy Keeler on 04/18/2022 13:31:10 [name] [name], [name] [name] ([id number]) [id number] Page 2 of 7 -------------------------------------------------------------------------------- Physical Exam Details Patient Name: Date of Service: [name] MBS, [place] H. 04/18/2022 8:45 A M Medical Record Number: [id number] Patient Account Number: [id number] Date of Birth/Sex: Treating RN: [birth year] (88 y.o. [name] [name] Primary Care Provider: [name] Other Clinician: Referring Provider: Treating Provider/Extender: Fara Chute Weeks in Treatment: 0 Constitutional patient is hypertensive.. pulse regular and within target range for patient.Marland Kitchen respirations regular, non-labored and within target range for patient.Marland Kitchen temperature within target range for patient.. Well-nourished and well-hydrated in no acute distress. Eyes conjunctiva clear no eyelid edema noted. pupils equal round and reactive to light and accommodation. Ears, Nose, Mouth, and Throat no gross abnormality of ear auricles or external auditory canals. normal hearing noted during conversation. mucus membranes moist. Respiratory normal breathing without difficulty. Cardiovascular 1+ dorsalis pedis/posterior tibialis pulses. 1+ pitting edema of the bilateral lower extremities. Musculoskeletal Patient unable to walk without assistance. no significant deformity or  arthritic changes, no loss or range of motion, no clubbing. Psychiatric this patient is able to make decisions and demonstrates good insight into disease process. Alert and Oriented x 3. pleasant and cooperative. Notes Upon inspection patient's legs do not show any signs of open wounds  but he does have a lot of dry scaly skin pretty much across the board when it comes to his legs. Fortunately I do not see any evidence of active infection locally or systemically which is great news. Nonetheless that something we will definitely keep a close eye on as well. I do believe he is going to need some compression and I do believe triamcinolone could be beneficial to his legs as well. Electronic Signature(s) Signed: 04/18/2022 1:31:53 PM By: Worthy Keeler PA-C Entered By: Worthy Keeler on 04/18/2022 13:31:53 -------------------------------------------------------------------------------- Physician Orders Details Patient Name: Date of Service: [name] MBS, [place]. 04/18/2022 8:45 A M Medical Record Number: [id number] Patient Account Number: [id number] Date of Birth/Sex: Treating RN: [birth year] (88 y.o. [name] [name] Primary Care Provider: [name] Other Clinician: Referring Provider: Treating Provider/Extender: Dayna Barker in Treatment: [address] [name] [name] ([id number]) [id number] Page 3 of 7 Verbal / Phone Orders: No Diagnosis Coding ICD-10 Coding Code Description I89.0 Lymphedema, not elsewhere classified I10 Essential (primary) hypertension I50.42 Chronic combined systolic (congestive) and diastolic (congestive) heart failure J44.9 Chronic obstructive pulmonary disease, unspecified I71.40 Abdominal aortic aneurysm, without rupture, unspecified I35.2 Nonrheumatic aortic (valve) stenosis with insufficiency Follow-up Appointments Return Appointment in 1 week. [place] for wound care. May utilize formulary equivalent  dressing for wound treatment orders unless otherwise specified. Home Health Nurse may visit PRN to address patients wound care needs. Latricia Heft [phone] Bathing/ Shower/ Hygiene May shower; gently cleanse wound with antibacterial soap, rinse and pat dry prior to dressing wounds - on dressing change day Edema Control - Lymphedema / Segmental Compressive Device / Other Tubigrip double layer applied - size C apply silver cell to any open areas 3 times per week, Elevate, Exercise Daily and A void Standing for Long Periods of Time. Elevate legs to the level of the heart and pump ankles as often as possible Elevate leg(s) parallel to the floor when sitting. Patient Medications llergies: Sulfa (Sulfonamide Antibiotics), iohexol, celecoxib, nitrofurantoin, Spiriva with HandiHaler, atorvastatin A Notifications Medication Indication Start End 04/18/2022 triamcinolone acetonide DOSE topical 0.1 % ointment - ointment topical Apply thin film to the legs in the area of redness and dry skin with each compression change 3 times per week x30 days Electronic Signature(s) Signed: 04/22/2022 8:00:00 AM By: Carlene Coria RN Signed: 05/15/2022 5:46:53 PM By: Worthy Keeler PA-C Previous Signature: 04/18/2022 1:34:33 PM Version By: Worthy Keeler PA-C Entered By: Carlene Coria on 04/22/2022 07:59:48 -------------------------------------------------------------------------------- Problem List Details Patient Name: Date of Service: [name] MBS, [place] H. 04/18/2022 8:45 A M Medical Record Number: [id number] Patient Account Number: [id number] Date of Birth/Sex: Treating RN: 09/07/1933 (88 y.o. [name] [name] Primary Care Provider: [name] Other Clinician: Referring Provider: Treating Provider/Extender: Dayna Barker in Treatment: 0 Active Problems ICD-10 Encounter Code Description Active Date MDM Diagnosis I89.0 Lymphedema, not elsewhere classified 04/18/2022 No Yes FRANKIE, SCIPIO  ([id number]) [id number] Page 4 of 7 I10 Essential (primary) hypertension 04/18/2022 No Yes I50.42 Chronic combined systolic (congestive) and diastolic (congestive) heart failure 04/18/2022 No Yes J44.9 Chronic obstructive pulmonary disease, unspecified 04/18/2022 No Yes I71.40 Abdominal aortic aneurysm, without rupture, unspecified 04/18/2022 No Yes I35.2 Nonrheumatic aortic (valve) stenosis with insufficiency 04/18/2022 No Yes Inactive Problems Resolved Problems Electronic Signature(s) Signed: 04/18/2022 10:09:53 AM By: Worthy Keeler PA-C Entered By: Worthy Keeler on 04/18/2022 10:09:53 -------------------------------------------------------------------------------- Progress Note Details Patient Name: Date of Service: [name] MBS, [place] [facility] H. 04/18/2022  8:45 A M Medical Record Number: [id number] Patient Account Number: [id number] Date of Birth/Sex: Treating RN: 1933/12/24 (88 y.o. [name] [name]) Carlene Coria Primary Care Provider: [name] Other Clinician: Referring Provider: Treating Provider/Extender: Fara Chute Weeks in Treatment: 0 Subjective Chief Complaint Information obtained from Patient Bilateral LE Lymphedema History of Present Illness (HPI) 04-18-2022 upon evaluation today patient appears for initial evaluation in the clinic though apparently I have taking care of his wife previous. I did look her up and I do remember her in fact. The good news is she is healed and still doing well as far as I am aware I did not ask specifically. With that being said the patient unfortunately is having issues with bilateral lower extremity lymphedema which is something that has been quite significant for him it sounds. He has been seen by [facility] who referred him to [place]. The goal based on review of those notes is good to be to order and lymphedema pumps. With that being said he is going to need some counter compression and we discussed that to some  degree today as well. He did have a 3 layer compression wrap unfortunately when they put Unna boot wrap on him that caused a significant reaction that they think may have been allergic and therefore that has been avoided at this point. The patient's had issues with his lower extremities and swelling for quite some time. Patient does have a history of lymphedema, hypertension, congestive heart failure, COPD, abdominal aortic aneurysm, and aortic valve stenosis. Patient History Allergies Sulfa (Sulfonamide Antibiotics), iohexol, celecoxib, nitrofurantoin, Spiriva with HandiHaler, atorvastatin [name] [name], [name] [name] ([id number]) [id number] Page 5 of 7 Social History Former smoker, Marital Status - Married, Alcohol Use - Never, Drug Use - No History, Caffeine Use - Moderate. Medical History Respiratory Patient has history of Chronic Obstructive Pulmonary Disease (COPD), Sleep Apnea Cardiovascular Patient has history of Congestive Heart Failure, Hypertension Review of Systems (ROS) Eyes Complains or has symptoms of Glasses / Contacts. Integumentary (Skin) Complains or has symptoms of Swelling. Objective Constitutional patient is hypertensive.. pulse regular and within target range for patient.Marland Kitchen respirations regular, non-labored and within target range for patient.Marland Kitchen temperature within target range for patient.. Well-nourished and well-hydrated in no acute distress. Vitals Time Taken: 8:52 AM, Height: 73 in, Source: Stated, Weight: 226 lbs, Source: Stated, BMI: 29.8, Temperature: 98.2 F, Pulse: 73 bpm, Respiratory Rate: 20 breaths/min, Blood Pressure: 151/76 mmHg. Eyes conjunctiva clear no eyelid edema noted. pupils equal round and reactive to light and accommodation. Ears, Nose, Mouth, and Throat no gross abnormality of ear auricles or external auditory canals. normal hearing noted during conversation. mucus membranes moist. Respiratory normal breathing without  difficulty. Cardiovascular 1+ dorsalis pedis/posterior tibialis pulses. 1+ pitting edema of the bilateral lower extremities. Musculoskeletal Patient unable to walk without assistance. no significant deformity or arthritic changes, no loss or range of motion, no clubbing. Psychiatric this patient is able to make decisions and demonstrates good insight into disease process. Alert and Oriented x 3. pleasant and cooperative. General Notes: Upon inspection patient's legs do not show any signs of open wounds but he does have a lot of dry scaly skin pretty much across the board when it comes to his legs. Fortunately I do not see any evidence of active infection locally or systemically which is great news. Nonetheless that something we will definitely keep a close eye on as well. I do believe he is going to need some compression and I do believe triamcinolone  could be beneficial to his legs as well. Assessment Active Problems ICD-10 Lymphedema, not elsewhere classified Essential (primary) hypertension Chronic combined systolic (congestive) and diastolic (congestive) heart failure Chronic obstructive pulmonary disease, unspecified Abdominal aortic aneurysm, without rupture, unspecified Nonrheumatic aortic (valve) stenosis with insufficiency Plan Follow-up Appointments: Return Appointment in 1 week. Home Health: [name] [name] for wound care. May utilize formulary equivalent dressing for wound treatment orders unless otherwise specified. Home Health Nurse may visit PRN to address patientoos wound care needs. Latricia Heft [phone] Bathing/ Shower/ Hygiene: May shower; gently cleanse wound with antibacterial soap, rinse and pat dry prior to dressing wounds - on dressing change day Edema Control - Lymphedema / Segmental Compressive Device / Other: Tubigrip double layer applied - size C apply silver cell to any open areas 3 times per week, [name] [name], [name] [name] ([id number])  [id number] Page 6 of 7 Elevate, Exercise Daily and Avoid Standing for Long Periods of Time. Elevate legs to the level of the heart and pump ankles as often as possible Elevate leg(s) parallel to the floor when sitting. The following medication(s) was prescribed: triamcinolone acetonide topical 0.1 % ointment ointment topical Apply thin film to the legs in the area of redness and dry skin with each compression change 3 times per week x30 days starting 04/18/2022 1. Based on what I am seeing at this point I am going to go ahead and initiate treatment with a regimen of triamcinolone to the legs in general as a steroid to try to help out with the inflammation which is quite significant. I think this is due to his excessive swelling which even though he has small legs is still quite prominent. 2. I am also can recommend that we need to have some kind of compression and I think the best way to go right now is probably going to be to utilize Tubigrip so they can apply the triamcinolone frequently. We also needs something that he can actually do at home and I think this is area where since he cannot wear any compression socks this could be beneficial for him. His daughter who was present with him during the evaluation today as well feels like that something that can be more likely an area that she be able to help him with as far as putting the Tubigrip on. This would also give him a chance to be able to shower and get some of the dry skin off in between and then reapply the triamcinolone more frequently. 3. I do believe elevating his legs is still good to be beneficial and also do believe that lymphedema pumps as our plan to be ordered from vascular could also be beneficial. That is something that they are working on is my understanding. We will see patient back for reevaluation in 1 week here in the clinic. If anything worsens or changes patient will contact our office for  additional recommendations. Electronic Signature(s) Signed: 04/18/2022 1:34:59 PM By: Worthy Keeler PA-C Entered By: Worthy Keeler on 04/18/2022 13:34:59 -------------------------------------------------------------------------------- ROS/PFSH Details Patient Name: Date of Service: [name] MBS, [employer] HN H. 04/18/2022 8:45 A M Medical Record Number: [id number] Patient Account Number: [id number] Date of Birth/Sex: Treating RN: 01-24-1934 (88 y.o. [name] [name] Primary Care Provider: [name] Other Clinician: Referring Provider: Treating Provider/Extender: Fara Chute Weeks in Treatment: 0 Eyes Complaints and Symptoms: Positive for: Glasses / Contacts Integumentary (Skin) Complaints and Symptoms: Positive for: Swelling Respiratory Medical History: Positive for: Chronic Obstructive Pulmonary Disease (COPD); Sleep Apnea  Cardiovascular Medical History: Positive for: Congestive Heart Failure; Hypertension Immunizations Pneumococcal Vaccine: Received Pneumococcal Vaccination: Yes Received Pneumococcal Vaccination On or After 60th Birthday: Yes [address]plantable [address] ZETHAN, ALFIERI ([id number]) [id number] Page 7 of 7 No devices added Family and Social History Former smoker; Marital Status - Married; Alcohol Use: Never; Drug Use: No History; Caffeine Use: Moderate Electronic Signature(s) Signed: 04/18/2022 1:54:42 PM By: Worthy Keeler PA-C Signed: 04/21/2022 9:30:18 AM By: Carlene Coria RN Entered By: Carlene Coria on 04/18/2022 09:10:42 -------------------------------------------------------------------------------- SuperBill Details Patient Name: Date of Service: [name] MBS, [place] [facility] H. 04/18/2022 Medical Record Number: [id number] Patient Account Number: [id number] Date of Birth/Sex: Treating RN: [birth year] (88 y.o. [name] [name]) Carlene Coria Primary Care Provider: [name] Other Clinician: Referring Provider: Treating Provider/Extender: Fara Chute Weeks in Treatment: 0 Diagnosis Coding ICD-10 Codes Code Description I89.0 Lymphedema, not elsewhere classified I10 Essential (primary) hypertension I50.42 Chronic combined systolic (congestive) and diastolic (congestive) heart failure J44.9 Chronic obstructive pulmonary disease, unspecified I71.40 Abdominal aortic aneurysm, without rupture, unspecified I35.2 Nonrheumatic aortic (valve) stenosis with insufficiency Facility Procedures : CPT4 Code: [id number] Description: 99213 - WOUND CARE VISIT-LEV 3 EST PT Modifier: Quantity: 1 Physician Procedures : CPT4 Code Description Modifier [id number] - WC PHYS LEVEL 4 - NEW PT ICD-10 Diagnosis Description I89.0 Lymphedema, not elsewhere classified I10 Essential (primary) hypertension I50.42 Chronic combined systolic (congestive) and diastolic  (congestive) heart failure J44.9 Chronic obstructive pulmonary disease, unspecified Quantity: 1 Electronic Signature(s) Signed: 04/18/2022 1:35:13 PM By: Worthy Keeler PA-C Previous Signature: 04/18/2022 11:35:31 AM Version By: Carlene Coria RN Entered By: Worthy Keeler on 04/18/2022 13:35:13

## 2022-04-24 ENCOUNTER — Telehealth: Payer: Self-pay | Admitting: Primary Care

## 2022-04-24 DIAGNOSIS — J449 Chronic obstructive pulmonary disease, unspecified: Secondary | ICD-10-CM

## 2022-04-24 DIAGNOSIS — G4733 Obstructive sleep apnea (adult) (pediatric): Secondary | ICD-10-CM | POA: Diagnosis not present

## 2022-04-24 NOTE — Telephone Encounter (Signed)
Updated order for oxygen placed. Printed and faxed most recent office notes to [place]. Nothing further needed

## 2022-04-25 ENCOUNTER — Ambulatory Visit: Payer: PPO | Admitting: Internal Medicine

## 2022-04-27 ENCOUNTER — Encounter (INDEPENDENT_AMBULATORY_CARE_PROVIDER_SITE_OTHER): Payer: Self-pay | Admitting: Nurse Practitioner

## 2022-04-27 NOTE — Progress Notes (Signed)
Subjective:    Patient ID: [name] [name], male    DOB: [birth year], 88 y.o.   MRN: [id number] Chief Complaint  Patient presents with   New Patient (Initial Visit)    Ref Damita Dunnings consult ulcer to LLE    [name] [name] is an 88 year old male who presents today for evaluation of weeping and swelling of his bilateral lower extremities.  The patient previously had venous ulcerations however these have largely resolved.  There are some areas of dry skin but no drastically open ulceration areas.  He still continues to have significant weeping.  He previously had some improvement with weeping with antibiotic therapy.  His legs have previously never been cultured for source of infection.  He has utilized [employer] but these caused worsening skin peeling.  Today he wears a version of a modified wrap but he still continues to have significant weeping with this.  He has an upcoming evaluation by wound care as well.  Today noninvasive studies show an ABI of 1.10 on the right and 1.05 on the left he has biphasic/triphasic waveforms on the right with monophasic tibial artery waveforms on the left.    Review of Systems  Cardiovascular:  Positive for leg swelling.  Skin:  Positive for wound.  All other systems reviewed and are negative.      Objective:   Physical Exam Vitals reviewed.  HENT:     Head: Normocephalic.  Cardiovascular:     Rate and Rhythm: Normal rate.     Pulses: Normal pulses.  Pulmonary:     Effort: Pulmonary effort is normal.  Musculoskeletal:     Right lower leg: Edema present.     Left lower leg: Edema present.  Skin:    General: Skin is warm.  Neurological:     Mental Status: He is alert and oriented to person, place, and time.     Motor: Weakness present.     Gait: Gait abnormal.  Psychiatric:        Mood and Affect: Mood normal.        Behavior: Behavior normal.        Thought Content: Thought content normal.        Judgment: Judgment normal.     BP 133/74 (BP  Location: Left Arm)   Pulse 77   Resp 16   Past Medical History:  Diagnosis Date   AAA (abdominal aortic aneurysm) (HCC)    a. 3.8 cm CTA 05/2015 b. 3.9 cm by [place] in 11/2017   Anxiety    COPD (chronic obstructive pulmonary disease) ([place])    on home O2 4LPM   Diverticulosis    Former smoker    quit 2001   Hemorrhoids    Kidney stones    passed spontaneously   OSA on CPAP    last study- in the home possibility- 2015, pt. unsure    Prediabetes    Hgb A1C 5.8 in Jan 2017   PUD (peptic ulcer disease)    Right knee DJD 10/31/2011   Severe   S/P TAVR (transcatheter aortic valve replacement) 06/26/2015   26 mm Edwards Sapien 3 transcatheter heart valve placed via percutaneous right transfemoral approach   Severe aortic stenosis    a. Echo 11/16:  Mod LVH, EF 55-60%, no RWMA, Gr 1 DD, severe AS (mean 51 mmHg; peak 86 mmHg), MAC, mild LAE  b. 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     Social Determinants of Health   Financial Resource Strain: Low Risk  (12/16/2021)   Overall Financial Resource Strain  (CARDIA)    Difficulty of Paying Living Expenses: Not hard at all  Food Insecurity: No Food Insecurity (02/13/2022)   Hunger Vital Sign    Worried About Running Out of Food in the Last Year: Never true    Ran Out of Food in the Last Year: Never true  Transportation Needs: No Transportation Needs (02/13/2022)   PRAPARE - Hydrologist (Medical): No    Lack of Transportation (Non-Medical): No  Physical Activity: Sufficiently Active (12/16/2021)   Exercise Vital Sign    Days of Exercise per Week: 3 days    Minutes of Exercise per Session: 60 min  Stress: No Stress Concern Present (11/02/2019)   [place]    Feeling of Stress : Not at all  Social Connections: [place] (12/16/2021)   Social Connection and Isolation Panel [NHANES]    Frequency of Communication with Friends and Family: More than three times a week    Frequency of Social Gatherings with Friends and Family: More than three times a week    Attends Religious Services: More than 4 times per year    Active Member of Clubs or Organizations: Yes    Attends Archivist Meetings: More than 4 times per year    Marital Status: Married  Human resources officer Violence: Not At Risk (12/16/2021)   Humiliation, Afraid, Rape, and Kick questionnaire    Fear of Current or Ex-Partner: No    Emotionally Abused: No    Physically Abused: No    Sexually Abused: No    Past Surgical History:  Procedure Laterality Date   CARDIAC CATHETERIZATION N/A 05/15/2015   Procedure: Right/Left Heart Cath and Coronary Angiography;  Surgeon: [name], MD;  Location: [facility];  Service: Cardiovascular;  Laterality: N/A;   CHOLECYSTECTOMY     EYE SURGERY     bilateral cataracts removed, /w IOL   KNEE SURGERY Right 1985   arthrosopic -    TEE WITHOUT CARDIOVERSION N/A 06/26/2015   Procedure: TRANSESOPHAGEAL ECHOCARDIOGRAM (TEE);  Surgeon:  [name], MD;  Location: [facility];  Service: Open Heart Surgery;  Laterality: N/A;   TONSILLECTOMY     TRANSCATHETER AORTIC VALVE REPLACEMENT, TRANSFEMORAL N/A 06/26/2015   Procedure: TRANSCATHETER AORTIC  VALVE REPLACEMENT, TRANSFEMORAL;  Surgeon: [name], MD;  Location: [facility];  Service: Open Heart Surgery;  Laterality: N/A;    Family History  Problem Relation Age of Onset   Stroke Mother    Heart attack Mother 89       deceased   Prostate cancer Father 52   Prostate cancer Brother    Colon cancer Neg Hx     Allergies  Allergen Reactions   Iohexol Hives and Swelling    Pt reports swelling, redness, hives, and blisters    Atorvastatin     REACTION: aches   Celecoxib     REACTION: rash   Clinoril [Sulindac]     REACTION: rash   Nitrofurantoin    Spiriva Handihaler [Tiotropium Bromide Monohydrate] Other (See Comments)    Dry mouth   Sulfa Antibiotics     Intolerant but unrecalled.        Latest Ref Rng & Units 04/14/2022    1:20 PM 03/24/2022    1:45 PM 03/03/2022   10:49 AM  CBC  Hemoglobin 13.0 - 17.0 g/dL 9.2  9.5  8.6   Hematocrit 39.0 - 52.0 % 31.3  31.9  29.8       CMP     Component Value Date/Time   NA 140 12/09/2021 1514   NA 146 (H) 05/20/2021 1533   K 3.7 12/09/2021 1514   CL 94 (L) 12/09/2021 1514   CO2 38 (H) 12/09/2021 1514   GLUCOSE 115 (H) 12/09/2021 1514   BUN 25 (H) 12/09/2021 1514   BUN 16 05/20/2021 1533   CREATININE 1.22 12/09/2021 1514   CREATININE 1.01 11/01/2021 1607   CALCIUM 8.4 (L) 12/09/2021 1514   PROT 6.8 12/09/2021 1514   ALBUMIN 2.9 (L) 12/09/2021 1514   AST 22 12/09/2021 1514   ALT 12 12/09/2021 1514   ALKPHOS 107 12/09/2021 1514   BILITOT 0.7 12/09/2021 1514   GFRNONAA 58 (L) 12/09/2021 1514   GFRAA >60 12/03/2016 0435     VAS [place] ABI WITH/WO TBI  Result Date: 04/17/2022  LOWER EXTREMITY DOPPLER STUDY Patient Name:  KOKI BUXTON  Date of Exam:   04/16/2022 Medical Rec #: [id number]     Accession #:    [id number]  Date of Birth: 1934-03-23     Patient Gender: M Patient Age:   50 years Exam Location:  [facility] Procedure:      VAS [place] ABI WITH/WO TBI Referring Phys: Eulogio Ditch --------------------------------------------------------------------------------  Indications: Claudication, and ulceration. High Risk Factors: Hypertension, past history of smoking, prior MI, coronary                    artery disease.  Performing Technologist: Delorise Shiner RVT  Examination Guidelines: A complete evaluation includes at minimum, Doppler waveform signals and systolic blood pressure reading at the level of bilateral brachial, anterior tibial, and posterior tibial arteries, when vessel segments are accessible. Bilateral testing is considered an integral part of a complete examination. Photoelectric Plethysmograph (PPG) waveforms and toe systolic pressure readings are included as required and additional duplex testing as needed. Limited examinations for reoccurring indications may be performed as noted.  ABI Findings: +---------+------------------+-----+---------+--------+ Right    Rt Pressure (mmHg)IndexWaveform Comment  +---------+------------------+-----+---------+--------+ Brachial 149                                      +---------+------------------+-----+---------+--------+ PTA  154               1.03 triphasic         +---------+------------------+-----+---------+--------+ DP       165               1.10 biphasic          +---------+------------------+-----+---------+--------+ Great Toe90                0.60                   +---------+------------------+-----+---------+--------+ +---------+------------------+-----+----------+-------+ Left     Lt Pressure (mmHg)IndexWaveform  Comment +---------+------------------+-----+----------+-------+ Brachial 150                                      +---------+------------------+-----+----------+-------+ PTA      152                1.01 monophasic        +---------+------------------+-----+----------+-------+ DP       157               1.05 monophasic        +---------+------------------+-----+----------+-------+ Great Toe72                0.48                   +---------+------------------+-----+----------+-------+ +-------+-----------+-----------+------------+------------+ ABI/TBIToday's ABIToday's TBIPrevious ABIPrevious TBI +-------+-----------+-----------+------------+------------+ Right  1.10       0.60                                +-------+-----------+-----------+------------+------------+ Left   1.05       0.48                                +-------+-----------+-----------+------------+------------+  Summary: Right: Resting right ankle-brachial index is within normal range. The right toe-brachial index is abnormal. Left: Resting left ankle-brachial index is within normal range. The left toe-brachial index is abnormal. Although ankle brachial indices are within normal limits (0.95-1.29), arterial Doppler waveforms at the ankle suggest some component of arterial occlusive disease. *See table(s) above for measurements and observations.  Electronically signed by Hortencia Pilar MD on 04/17/2022 at 5:50:09 PM.    Final        Assessment & Plan:   1. Venous stasis ulcer, unspecified site, unspecified ulcer stage, unspecified whether varicose veins present ([place]) Under wraps would be helpful for the patient however in the using these the patient has had worsening pulling and skin deterioration.  Therefore he has been using a modified version of the wraps but those have not been providing adequate compression for the patient.  Given his significant weeping, we will also culture the substance to determine if there is an infectious agent.  We will begin using 3 layer compression wraps to see if this is helpful for the patient.  We will also give his instruction to the patient's home health agency.  We  will have her return in 6 to 8 weeks to determine progress.  2. Chronic heart failure with preserved ejection fraction (HFpEF) ([place]) May play a component of the patient's weeping  3. Essential hypertension Continue antihypertensive medications as already ordered, these medications have been reviewed and there are no changes at this time.  Current Outpatient Medications on File Prior to Visit  Medication Sig Dispense Refill   albuterol (PROAIR HFA) 108 (90 Base) MCG/ACT inhaler INHALE 1-2 PUFFS INTO THE LUNGS EVERY 6 (SIX) HOURS AS NEEDED FOR WHEEZING. 8.5 g 5   aspirin EC 81 MG tablet Take 1 tablet (81 mg total) by mouth daily.     furosemide (LASIX) 40 MG tablet TAKE 1 TABLET BY MOUTH EVERY DAY 90 tablet 0   Iron, Ferrous Sulfate, 325 (65 Fe) MG TABS Take 325 mg by mouth daily.     LORazepam (ATIVAN) 1 MG tablet TAKE 1/4 TO 1/2 TABLET BY MOUTH EVERY 6 HOURS AS NEEDED 30 tablet 1   OXYGEN Inhale into the lungs. 5 Liters     Respiratory Therapy Supplies (FLUTTER) DEVI Use as directed 1 each 0   SYMBICORT 160-4.5 MCG/ACT inhaler TAKE 2 PUFFS BY MOUTH TWICE A DAY 30.6 each 3   Syringe/Needle, Disp, (SYRINGE 3CC/25GX1") 25G X 1" 3 ML MISC 1 mL by Does not apply route once a week. 100 each 1   cyanocobalamin (VITAMIN B12) 1000 MCG/ML injection INJECT 1 ML (1,000 MCG TOTAL) INTO THE MUSCLE ONCE A WEEK. (Patient not taking: Reported on 04/09/2022) 4 mL 0   Wound Dressings (CURITY UNNA BOOT) MISC Dispense 4 rolls of unna boot wrap to use on legs with coban. (Patient not taking: Reported on 04/09/2022) 4 each 2   No current facility-administered medications on file prior to visit.    There are no Patient Instructions on file for this visit. No follow-ups on file.   Kris Hartmann, NP

## 2022-04-28 ENCOUNTER — Encounter: Payer: PPO | Admitting: Physician Assistant

## 2022-04-28 DIAGNOSIS — I890 Lymphedema, not elsewhere classified: Secondary | ICD-10-CM | POA: Diagnosis not present

## 2022-04-28 DIAGNOSIS — L97828 Non-pressure chronic ulcer of other part of left lower leg with other specified severity: Secondary | ICD-10-CM | POA: Diagnosis not present

## 2022-04-28 DIAGNOSIS — L97818 Non-pressure chronic ulcer of other part of right lower leg with other specified severity: Secondary | ICD-10-CM | POA: Diagnosis not present

## 2022-04-28 NOTE — Progress Notes (Signed)
JEMAINE, PROKOP ([id number]) [id number] Page 1 of 6 Visit Report for 04/28/2022 Chief Complaint Document Details Patient Name: Date of Service: CO MBS, [name] [place] [name]. 04/28/2022 2:00 PM Medical Record Number: [id number] Patient Account Number: [id number] Date of Birth/Sex: Treating RN: [birth year] (88 y.o. [name] [name]) Carlene Coria Primary Care Provider: [name] Other Clinician: Referring Provider: Treating Provider/Extender: Fara Chute Weeks in Treatment: 1 Information Obtained from: Patient Chief Complaint Bilateral LE [name] Electronic Signature(s) Signed: 04/28/2022 2:33:21 PM By: Worthy Keeler PA-C Entered By: Worthy Keeler on 04/28/2022 14:33:20 -------------------------------------------------------------------------------- HPI Details Patient Name: Date of Service: CO MBS, [name] HN [name]. 04/28/2022 2:00 PM Medical Record Number: [id number] Patient Account Number: [id number] Date of Birth/Sex: Treating RN: [birth year] (88 y.o. [name] [name] Primary Care Provider: [name] Other Clinician: Referring Provider: Treating Provider/Extender: Fara Chute Weeks in Treatment: 1 History of Present Illness HPI Description: 04-18-2022 upon evaluation today patient appears for initial evaluation in the clinic though apparently I have taking care of his wife previous. I did look her up and I do remember her in fact. The good news is she is healed and still doing well as far as I am aware I did not ask specifically. With that being said the patient unfortunately is having issues with bilateral lower extremity [name] which is something that has been quite significant for him it sounds. He has been seen by [facility] who referred him to [place]. The goal based on review of those notes is good to be to order and [name] pumps. With that being said he is going to need some counter compression and we discussed that to some  degree today as well. He did have a 3 layer compression wrap unfortunately when they put Unna boot wrap on him that caused a significant reaction that they think may have been allergic and therefore that has been avoided at this point. The patient's had issues with his lower extremities and swelling for quite some time. Patient does have a history of [name], hypertension, congestive heart failure, COPD, abdominal aortic aneurysm, and aortic valve stenosis. 04-28-2022 upon evaluation today patient appears to be doing well currently in regard to his legs he is actually showing signs of significant improvement. Again we have been treating him for [name] he is draining very minimally at this point which is great news the Tubigrip is doing great the triamcinolone has also done great. Overall I am extremely pleased with where things stand currently. Electronic Signature(s) BRYANT, SAYE ([id number]) [id number] Page 2 of 6 Signed: 04/28/2022 2:50:52 PM By: Worthy Keeler PA-C Entered By: Worthy Keeler on 04/28/2022 14:50:51 -------------------------------------------------------------------------------- Physical Exam Details Patient Name: Date of Service: CO MBS, [name] HN [name]. 04/28/2022 2:00 PM Medical Record Number: [id number] Patient Account Number: [id number] Date of Birth/Sex: Treating RN: [birth year] (88 y.o. [name] [name] Primary Care Provider: [name] Other Clinician: Referring Provider: Treating Provider/Extender: Fara Chute Weeks in Treatment: 1 Constitutional Well-nourished and well-hydrated in no acute distress. Respiratory normal breathing without difficulty. Psychiatric this patient is able to make decisions and demonstrates good insight into disease process. Alert and Oriented x 3. pleasant and cooperative. Notes Upon inspection patient's wound bed actually showed signs of good granulation and epithelization at this point.  Fortunately there does not appear to be any evidence of infection locally or systemically at this time which is great news and overall I am extremely pleased with where we stand today. No fevers,  chills, nausea, vomiting, or diarrhea. Electronic Signature(s) Signed: 04/28/2022 2:51:17 PM By: Worthy Keeler PA-C Entered By: Worthy Keeler on 04/28/2022 14:51:16 -------------------------------------------------------------------------------- Physician Orders Details Patient Name: Date of Service: CO MBS, [name] HN [name]. 04/28/2022 2:00 PM Medical Record Number: [id number] Patient Account Number: [id number] Date of Birth/Sex: Treating RN: [birth year] (88 y.o. [name] [name] Primary Care Provider: [name] Other Clinician: Referring Provider: Treating Provider/Extender: Dayna Barker in Treatment: 1 Verbal / Phone Orders: No Diagnosis Coding ICD-10 Coding Code Description I89.0 [name], not elsewhere classified I10 Essential (primary) hypertension ARIZ, TERRONES ([id number]) [id number] Page 3 of 6 I50.42 Chronic combined systolic (congestive) and diastolic (congestive) heart failure J44.9 Chronic obstructive pulmonary disease, unspecified I71.40 Abdominal aortic aneurysm, without rupture, unspecified I35.2 Nonrheumatic aortic (valve) stenosis with insufficiency Discharge From [facility] Discharge from [place] Treatment Complete Electronic Signature(s) Signed: 04/29/2022 3:27:07 PM By: Carlene Coria RN Signed: 04/30/2022 2:43:36 PM By: Worthy Keeler PA-C Entered By: Carlene Coria on 04/29/2022 15:27:06 -------------------------------------------------------------------------------- Problem List Details Patient Name: Date of Service: CO MBS, [name] HN [name]. 04/28/2022 2:00 PM Medical Record Number: [id number] Patient Account Number: [id number] Date of Birth/Sex: Treating RN: 1933-10-10 (88 y.o. [name] [name] Primary Care  Provider: [name] Other Clinician: Referring Provider: Treating Provider/Extender: Fara Chute Weeks in Treatment: 1 Active Problems ICD-10 Encounter Code Description Active Date MDM Diagnosis I89.0 [name], not elsewhere classified 04/18/2022 No Yes I10 Essential (primary) hypertension 04/18/2022 No Yes I50.42 Chronic combined systolic (congestive) and diastolic (congestive) heart failure 04/18/2022 No Yes J44.9 Chronic obstructive pulmonary disease, unspecified 04/18/2022 No Yes I71.40 Abdominal aortic aneurysm, without rupture, unspecified 04/18/2022 No Yes I35.2 Nonrheumatic aortic (valve) stenosis with insufficiency 04/18/2022 No Yes Inactive Problems Resolved Problems Electronic Signature(s) Signed: 04/28/2022 2:33:13 PM By: [name] [name], [name] [name] ([id number]) [phone].pdf Page 4 of 6 Entered By: Worthy Keeler on 04/28/2022 14:33:13 -------------------------------------------------------------------------------- Progress Note Details Patient Name: Date of Service: CO MBS, [name] [place] [name]. 04/28/2022 2:00 PM Medical Record Number: [id number] Patient Account Number: [id number] Date of Birth/Sex: Treating RN: 07-29-1933 (88 y.o. [name] [name] Primary Care Provider: [name] Other Clinician: Referring Provider: Treating Provider/Extender: Dayna Barker in Treatment: 1 Subjective Chief Complaint Information obtained from Patient Bilateral LE [name] History of Present Illness (HPI) 04-18-2022 upon evaluation today patient appears for initial evaluation in the clinic though apparently I have taking care of his wife previous. I did look her up and I do remember her in fact. The good news is she is healed and still doing well as far as I am aware I did not ask specifically. With that being said the patient unfortunately is having issues with bilateral lower extremity [name] which is something that  has been quite significant for him it sounds. He has been seen by [facility] who referred him to [place]. The goal based on review of those notes is good to be to order and [name] pumps. With that being said he is going to need some counter compression and we discussed that to some degree today as well. He did have a 3 layer compression wrap unfortunately when they put Unna boot wrap on him that caused a significant reaction that they think may have been allergic and therefore that has been avoided at this point. The patient's had issues with his lower extremities and swelling for quite some time. Patient does have a history of [name], hypertension, congestive heart failure,  COPD, abdominal aortic aneurysm, and aortic valve stenosis. 04-28-2022 upon evaluation today patient appears to be doing well currently in regard to his legs he is actually showing signs of significant improvement. Again we have been treating him for [name] he is draining very minimally at this point which is great news the Tubigrip is doing great the triamcinolone has also done great. Overall I am extremely pleased with where things stand currently. Objective Constitutional Well-nourished and well-hydrated in no acute distress. Vitals Time Taken: 2:17 PM, Height: 73 in, Weight: 226 lbs, BMI: 29.8, Temperature: 98.1 F, Pulse: 75 bpm, Respiratory Rate: 18 breaths/min, Blood Pressure: 154/71 mmHg. Respiratory normal breathing without difficulty. Psychiatric this patient is able to make decisions and demonstrates good insight into disease process. Alert and Oriented x 3. pleasant and cooperative. General Notes: Upon inspection patient's wound bed actually showed signs of good granulation and epithelization at this point. Fortunately there does not appear to be any evidence of infection locally or systemically at this time which is great news and overall I am extremely pleased with where we stand  today. No fevers, chills, nausea, vomiting, or diarrhea. Assessment Active Problems ICD-10 [name], not elsewhere classified [name] [name], [name] [name] ([id number]) [id number] Page 5 of 6 Essential (primary) hypertension Chronic combined systolic (congestive) and diastolic (congestive) heart failure Chronic obstructive pulmonary disease, unspecified Abdominal aortic aneurysm, without rupture, unspecified Nonrheumatic aortic (valve) stenosis with insufficiency Plan Follow-up Appointments: Return Appointment in 1 week. Home Health: [facility] for wound care. May utilize formulary equivalent dressing for wound treatment orders unless otherwise specified. Home Health Nurse may visit PRN to address patientoos wound care needs. [name] [name] [phone] [name] [name]: May shower; gently cleanse wound with antibacterial soap, rinse and pat dry prior to dressing wounds - on dressing change day [name] [name] / Segmental Compressive Device / Other: Tubigrip double layer applied - size C apply silver cell to any open areas 3 times per week, Elevate, Exercise Daily and Avoid Standing for Long Periods of Time. Elevate legs to the level of the heart and pump ankles as often as possible Elevate leg(s) parallel to the floor when sitting. 1. I am going to recommend that we have the patient continue to monitor for any signs of worsening or infection at this point his legs are actually doing much better and I think he is really in good shape he likes the Tubigrip the way it feels and states that it is very comfortable compared to a lot of things he is tried before. Overall I believe he is in a good place to be able to manage this at home on his own. His daughter is helping him. 2. Also can recommend that we have the patient continue with the Tubigrip he is doing size C at this point which I think will do excellent for him I [name] [name] suggest that he continue as  such going forward. Follow-up as needed. If he has any issues he should contact the office and let me know. Electronic Signature(s) Signed: 04/28/2022 2:52:01 PM By: Worthy Keeler PA-C Entered By: Worthy Keeler on 04/28/2022 14:52:01 -------------------------------------------------------------------------------- SuperBill Details Patient Name: Date of Service: CO MBS, [name] HN [name]. 04/28/2022 Medical Record Number: [id number] Patient Account Number: [id number] Date of Birth/Sex: Treating RN: May 18, 1934 (88 y.o. [name] [name] Primary Care Provider: [name] Other Clinician: Referring Provider: Treating Provider/Extender: Fara Chute Weeks in Treatment: 1 Diagnosis Coding ICD-10 Codes Code Description I89.0 [name], not elsewhere classified I10  Essential (primary) hypertension I50.42 Chronic combined systolic (congestive) and diastolic (congestive) heart failure J44.9 Chronic obstructive pulmonary disease, unspecified I71.40 Abdominal aortic aneurysm, without rupture, unspecified I35.2 Nonrheumatic aortic (valve) stenosis with insufficiency Physician Procedures : CPT4 Code Description Modifier KYRUS, HYDE ([id number]) [id number] - WC PHYS LEVEL 3 - EST PT 1 ICD-10 Diagnosis Description I89.0 [name], not elsewhere classified I10 Essential (primary) hypertension  I50.42 Chronic combined systolic (congestive) and diastolic (congestive) heart failure J44.9 Chronic obstructive pulmonary disease, unspecified Quantity: Page 6 of 6 Electronic Signature(s) Signed: 04/28/2022 2:56:59 PM By: Worthy Keeler PA-C Entered By: Worthy Keeler on 04/28/2022 14:56:58

## 2022-05-02 NOTE — Progress Notes (Signed)
[name] [name], [name] [name] ([id number]) [id number] Page 1 of 7 Visit Report for 04/28/2022 Arrival Information Details Patient Name: Date of Service: [name] [name], [place] [name]. 04/28/2022 2:00 PM Medical Record Number: [id number] Patient Account Number: [id number] Date of Birth/Sex: Treating RN: 1934/02/03 (88 y.o. [name] [name]) Carlene Coria Primary Care Eupha Lobb: [name] Other Clinician: Referring Christ Fullenwider: Treating Glorya Bartley/Extender: Dayna Barker in Treatment: 1 Visit Information History Since Last Visit All ordered tests and consults were completed: No Patient Arrived: Wheel Chair Added or deleted any medications: No Arrival Time: 14:17 Any new allergies or adverse reactions: No Accompanied By: daughter Had a fall or experienced change in No Transfer Assistance: None activities of daily living that may affect Patient Identification Verified: Yes risk of falls: Secondary Verification Process Completed: Yes Signs or symptoms of abuse/neglect since last visito No Patient Requires Transmission-Based Precautions: No Hospitalized since last visit: No Patient Has Alerts: Yes Implantable device outside of the clinic excluding No Patient Alerts: ABI R 1.10 TBI.60 04/16/22 cellular tissue based products placed in the center ABI L 1.05 TBI .48 11/1/2 since last visit: Has Dressing in Place as Prescribed: Yes Has Compression in Place as Prescribed: Yes Pain Present Now: No Electronic Signature(s) Signed: 05/02/2022 1:14:49 PM By: Carlene Coria RN Entered By: Carlene Coria on 04/28/2022 14:17:50 -------------------------------------------------------------------------------- Clinic Level of Care Assessment Details Patient Name: Date of Service: [name] [name]. 04/28/2022 2:00 PM Medical Record Number: [id number] Patient Account Number: [id number] Date of Birth/Sex: Treating RN: [birth year] (88 y.o. [name] [name] Primary Care Dayzha Pogosyan: [name] Other  Clinician: Referring Ulyses Panico: Treating Katalena Malveaux/Extender: Fara Chute Weeks in Treatment: 1 Clinic Level of Care Assessment Items TOOL 4 Quantity Score X- 1 0 Use when only an EandM is performed on FOLLOW-UP visit ASSESSMENTS - Nursing Assessment / Reassessment X- 1 10 Reassessment of [name]-morbidities (includes updates in patient status) [name] [name] ([id number]) [id number] Page 2 of 7 X- 1 5 Reassessment of Adherence to Treatment Plan ASSESSMENTS - Wound and Skin A ssessment / Reassessment '[]'$  - 0 Simple Wound Assessment / Reassessment - one wound '[]'$  - 0 Complex Wound Assessment / Reassessment - multiple wounds '[]'$  - 0 Dermatologic / Skin Assessment (not related to wound area) ASSESSMENTS - Focused Assessment '[]'$  - 0 Circumferential Edema Measurements - multi extremities '[]'$  - 0 Nutritional Assessment / Counseling / Intervention '[]'$  - 0 Lower Extremity Assessment (monofilament, tuning fork, pulses) '[]'$  - 0 Peripheral Arterial Disease Assessment (using hand held doppler) ASSESSMENTS - Ostomy and/or Continence Assessment and Care '[]'$  - 0 Incontinence Assessment and Management '[]'$  - 0 Ostomy Care Assessment and Management (repouching, etc.) PROCESS - Coordination of Care X - Simple Patient / Family Education for ongoing care 1 15 '[]'$  - 0 Complex (extensive) Patient / Family Education for ongoing care '[]'$  - 0 Staff obtains Programmer, systems, Records, T Results / Process Orders est '[]'$  - 0 Staff telephones HHA, Nursing Homes / Clarify orders / etc '[]'$  - 0 Routine Transfer to another Facility (non-emergent condition) '[]'$  - 0 Routine Hospital Admission (non-emergent condition) '[]'$  - 0 New Admissions / Biomedical engineer / Ordering NPWT Apligraf, etc. , '[]'$  - 0 Emergency Hospital Admission (emergent condition) X- 1 10 Simple Discharge Coordination '[]'$  - 0 Complex (extensive) Discharge Coordination PROCESS - Special Needs '[]'$  - 0 Pediatric /  Minor Patient Management '[]'$  - 0 Isolation Patient Management '[]'$  - 0 Hearing / Language / Visual special needs '[]'$  - 0 Assessment of Community assistance (transportation, D/C planning, etc.) '[]'$  -  [name] [name], [name] [name] ([id number]) [id number] Page 1 of 7 Visit Report for 04/28/2022 Arrival Information Details Patient Name: Date of Service: [name] [name], [place] [name]. 04/28/2022 2:00 PM Medical Record Number: [id number] Patient Account Number: [id number] Date of Birth/Sex: Treating RN: 1934/02/03 (88 y.o. [name] [name]) Carlene Coria Primary Care Eupha Lobb: [name] Other Clinician: Referring Christ Fullenwider: Treating Glorya Bartley/Extender: Dayna Barker in Treatment: 1 Visit Information History Since Last Visit All ordered tests and consults were completed: No Patient Arrived: Wheel Chair Added or deleted any medications: No Arrival Time: 14:17 Any new allergies or adverse reactions: No Accompanied By: daughter Had a fall or experienced change in No Transfer Assistance: None activities of daily living that may affect Patient Identification Verified: Yes risk of falls: Secondary Verification Process Completed: Yes Signs or symptoms of abuse/neglect since last visito No Patient Requires Transmission-Based Precautions: No Hospitalized since last visit: No Patient Has Alerts: Yes Implantable device outside of the clinic excluding No Patient Alerts: ABI R 1.10 TBI.60 04/16/22 cellular tissue based products placed in the center ABI L 1.05 TBI .48 11/1/2 since last visit: Has Dressing in Place as Prescribed: Yes Has Compression in Place as Prescribed: Yes Pain Present Now: No Electronic Signature(s) Signed: 05/02/2022 1:14:49 PM By: Carlene Coria RN Entered By: Carlene Coria on 04/28/2022 14:17:50 -------------------------------------------------------------------------------- Clinic Level of Care Assessment Details Patient Name: Date of Service: [name] [name]. 04/28/2022 2:00 PM Medical Record Number: [id number] Patient Account Number: [id number] Date of Birth/Sex: Treating RN: [birth year] (88 y.o. [name] [name] Primary Care Dayzha Pogosyan: [name] Other  Clinician: Referring Ulyses Panico: Treating Katalena Malveaux/Extender: Fara Chute Weeks in Treatment: 1 Clinic Level of Care Assessment Items TOOL 4 Quantity Score X- 1 0 Use when only an EandM is performed on FOLLOW-UP visit ASSESSMENTS - Nursing Assessment / Reassessment X- 1 10 Reassessment of [name]-morbidities (includes updates in patient status) [name] [name] ([id number]) [id number] Page 2 of 7 X- 1 5 Reassessment of Adherence to Treatment Plan ASSESSMENTS - Wound and Skin A ssessment / Reassessment '[]'$  - 0 Simple Wound Assessment / Reassessment - one wound '[]'$  - 0 Complex Wound Assessment / Reassessment - multiple wounds '[]'$  - 0 Dermatologic / Skin Assessment (not related to wound area) ASSESSMENTS - Focused Assessment '[]'$  - 0 Circumferential Edema Measurements - multi extremities '[]'$  - 0 Nutritional Assessment / Counseling / Intervention '[]'$  - 0 Lower Extremity Assessment (monofilament, tuning fork, pulses) '[]'$  - 0 Peripheral Arterial Disease Assessment (using hand held doppler) ASSESSMENTS - Ostomy and/or Continence Assessment and Care '[]'$  - 0 Incontinence Assessment and Management '[]'$  - 0 Ostomy Care Assessment and Management (repouching, etc.) PROCESS - Coordination of Care X - Simple Patient / Family Education for ongoing care 1 15 '[]'$  - 0 Complex (extensive) Patient / Family Education for ongoing care '[]'$  - 0 Staff obtains Programmer, systems, Records, T Results / Process Orders est '[]'$  - 0 Staff telephones HHA, Nursing Homes / Clarify orders / etc '[]'$  - 0 Routine Transfer to another Facility (non-emergent condition) '[]'$  - 0 Routine Hospital Admission (non-emergent condition) '[]'$  - 0 New Admissions / Biomedical engineer / Ordering NPWT Apligraf, etc. , '[]'$  - 0 Emergency Hospital Admission (emergent condition) X- 1 10 Simple Discharge Coordination '[]'$  - 0 Complex (extensive) Discharge Coordination PROCESS - Special Needs '[]'$  - 0 Pediatric /  Minor Patient Management '[]'$  - 0 Isolation Patient Management '[]'$  - 0 Hearing / Language / Visual special needs '[]'$  - 0 Assessment of Community assistance (transportation, D/C planning, etc.) '[]'$  -  Morey Hummingbird RN Entered By: Carlene Coria on 04/28/2022 14:25:16 [name] [name], [name] Desiree Lucy ([id number]) [id number] Page 5 of 7 -------------------------------------------------------------------------------- Multi-Disciplinary Care Plan Details Patient Name: Date of Service: [name] [name], [place] [name]. 04/28/2022 2:00 PM Medical Record Number: [id number] Patient Account Number: [id number] Date of Birth/Sex: Treating RN: [birth year] (88 y.o. [name] [name]) Carlene Coria Primary Care Chereese Cilento: [name] Other Clinician: Referring Amparo Donalson: Treating Lamon Rotundo/Extender: Fara Chute Weeks in Treatment: 1 Active Inactive Electronic Signature(s) Signed: 04/29/2022 3:29:27 PM By: Carlene Coria RN Entered By: Carlene Coria on 04/29/2022 15:29:27 -------------------------------------------------------------------------------- Pain Assessment Details Patient Name: Date of Service: [name] [name], [place] [facility] [name]. 04/28/2022 2:00 PM Medical Record Number: [id number] Patient Account Number: [id number] Date of Birth/Sex: Treating RN: Dec 28, 1933 (88 y.o. [name] [name] Primary Care Sueo Cullen: [name] Other Clinician: Referring Gaynel Schaafsma: Treating Aza Dantes/Extender: Fara Chute Weeks in Treatment: 1 Active Problems Location of Pain Severity and Description of Pain Patient Has Paino No Site Locations [name] [name], [name] [name]  ([id number]) [id number] Page 6 of 7 Pain Management and Medication Current Pain Management: Electronic Signature(s) Signed: 05/02/2022 1:14:49 PM By: Carlene Coria RN Entered By: Carlene Coria on 04/28/2022 14:18:19 -------------------------------------------------------------------------------- Patient/Caregiver Education Details Patient Name: Date of Service: [name] [name], [name] Bowels. 11/13/2023andnbsp2:00 PM Medical Record Number: [id number] Patient Account Number: [id number] Date of Birth/Gender: Treating RN: 1933-10-26 (88 y.o. [name] [name] Primary Care Physician: [name] Other Clinician: Referring Physician: Treating Physician/Extender: Dayna Barker in Treatment: 1 Education Assessment Education Provided To: Patient Education Topics Provided Wound/Skin Impairment: Methods: Explain/Verbal Responses: State content correctly Electronic Signature(s) Signed: 05/02/2022 1:14:49 PM By: Carlene Coria RN Entered By: Carlene Coria on 04/29/2022 15:27:37 -------------------------------------------------------------------------------- Vitals Details Patient Name: Date of Service: [name] [name], [place] HN [name]. 04/28/2022 2:00 PM Medical Record Number: [id number] Patient Account Number: [id number] Date of Birth/Sex: Treating RN: 1934-01-16 (88 y.o. [name] [name] Primary Care Khalilah Hoke: [name] Other Clinician: Referring Elyn Krogh: Treating Kariss Longmire/Extender: Fara Chute Weeks in Treatment: 1 Vital Signs Time Taken: 14:17 Temperature (F): 98.1 [name] [name], [name] [name] ([id number]) [phone].pdf Page 7 of 7 Height (in): 73 Pulse (bpm): 75 Weight (lbs): 226 Respiratory Rate (breaths/min): 18 Body Mass Index (BMI): 29.8 Blood Pressure (mmHg): 154/71 Reference Range: 80 - 120 mg / dl Electronic Signature(s) Signed: 05/02/2022 1:14:49 PM By: Carlene Coria RN Entered By: Carlene Coria on 04/28/2022 14:18:09

## 2022-05-05 ENCOUNTER — Inpatient Hospital Stay: Payer: PPO

## 2022-05-05 ENCOUNTER — Inpatient Hospital Stay: Payer: PPO | Attending: Oncology

## 2022-05-05 VITALS — BP 120/70 | HR 84

## 2022-05-05 DIAGNOSIS — N189 Chronic kidney disease, unspecified: Secondary | ICD-10-CM | POA: Diagnosis not present

## 2022-05-05 DIAGNOSIS — D649 Anemia, unspecified: Secondary | ICD-10-CM

## 2022-05-05 DIAGNOSIS — D631 Anemia in chronic kidney disease: Secondary | ICD-10-CM | POA: Diagnosis not present

## 2022-05-05 DIAGNOSIS — E538 Deficiency of other specified B group vitamins: Secondary | ICD-10-CM

## 2022-05-05 LAB — HEMOGLOBIN AND HEMATOCRIT, BLOOD
HCT: 33.9 % — ABNORMAL LOW (ref 39.0–52.0)
Hemoglobin: 9.9 g/dL — ABNORMAL LOW (ref 13.0–17.0)

## 2022-05-05 MED ORDER — EPOETIN ALFA-EPBX 40000 UNIT/ML IJ SOLN
40000.0000 [IU] | INTRAMUSCULAR | Status: DC
  Administered 2022-05-05: 40000 [IU] via SUBCUTANEOUS
  Filled 2022-05-05: qty 1

## 2022-05-12 ENCOUNTER — Telehealth: Payer: Self-pay | Admitting: Family Medicine

## 2022-05-12 DIAGNOSIS — J9611 Chronic respiratory failure with hypoxia: Secondary | ICD-10-CM | POA: Diagnosis not present

## 2022-05-12 DIAGNOSIS — J449 Chronic obstructive pulmonary disease, unspecified: Secondary | ICD-10-CM | POA: Diagnosis not present

## 2022-05-12 NOTE — Telephone Encounter (Signed)
Home Health verbal orders [place] Name:Stacy  Agency Name: [facility] number: [phone]  Requesting OT/PT/Skilled nursing/Social Work/Speech:  Reason:PT wants to report that  pt has a stage  2 pressure sore on bottom wants to continue PT   Frequency: 2 x w   3w     1x w   1w  Please forward to [facility] pool or providers CMA

## 2022-05-13 DIAGNOSIS — G4733 Obstructive sleep apnea (adult) (pediatric): Secondary | ICD-10-CM | POA: Diagnosis not present

## 2022-05-13 NOTE — Telephone Encounter (Signed)
Please give the order.  Thanks.   

## 2022-05-13 NOTE — Telephone Encounter (Signed)
Verbal orders have been given to [place] at [employer]

## 2022-05-15 DIAGNOSIS — H353131 Nonexudative age-related macular degeneration, bilateral, early dry stage: Secondary | ICD-10-CM | POA: Diagnosis not present

## 2022-05-15 DIAGNOSIS — H35371 Puckering of macula, right eye: Secondary | ICD-10-CM | POA: Diagnosis not present

## 2022-05-16 DIAGNOSIS — D500 Iron deficiency anemia secondary to blood loss (chronic): Secondary | ICD-10-CM | POA: Diagnosis not present

## 2022-05-16 DIAGNOSIS — J449 Chronic obstructive pulmonary disease, unspecified: Secondary | ICD-10-CM | POA: Diagnosis not present

## 2022-05-16 DIAGNOSIS — F419 Anxiety disorder, unspecified: Secondary | ICD-10-CM | POA: Diagnosis not present

## 2022-05-16 DIAGNOSIS — I5033 Acute on chronic diastolic (congestive) heart failure: Secondary | ICD-10-CM | POA: Diagnosis not present

## 2022-05-16 DIAGNOSIS — G4733 Obstructive sleep apnea (adult) (pediatric): Secondary | ICD-10-CM | POA: Diagnosis not present

## 2022-05-16 DIAGNOSIS — L97921 Non-pressure chronic ulcer of unspecified part of left lower leg limited to breakdown of skin: Secondary | ICD-10-CM | POA: Diagnosis not present

## 2022-05-16 DIAGNOSIS — J9611 Chronic respiratory failure with hypoxia: Secondary | ICD-10-CM | POA: Diagnosis not present

## 2022-05-16 DIAGNOSIS — I110 Hypertensive heart disease with heart failure: Secondary | ICD-10-CM | POA: Diagnosis not present

## 2022-05-16 DIAGNOSIS — I872 Venous insufficiency (chronic) (peripheral): Secondary | ICD-10-CM | POA: Diagnosis not present

## 2022-05-16 DIAGNOSIS — Z4800 Encounter for change or removal of nonsurgical wound dressing: Secondary | ICD-10-CM | POA: Diagnosis not present

## 2022-05-22 ENCOUNTER — Ambulatory Visit: Payer: PPO | Admitting: Internal Medicine

## 2022-05-24 DIAGNOSIS — Z954 Presence of other heart-valve replacement: Secondary | ICD-10-CM

## 2022-05-24 DIAGNOSIS — Z7982 Long term (current) use of aspirin: Secondary | ICD-10-CM

## 2022-05-24 DIAGNOSIS — I714 Abdominal aortic aneurysm, without rupture, unspecified: Secondary | ICD-10-CM | POA: Diagnosis not present

## 2022-05-24 DIAGNOSIS — Z4800 Encounter for change or removal of nonsurgical wound dressing: Secondary | ICD-10-CM | POA: Diagnosis not present

## 2022-05-24 DIAGNOSIS — Z87891 Personal history of nicotine dependence: Secondary | ICD-10-CM

## 2022-05-24 DIAGNOSIS — E559 Vitamin D deficiency, unspecified: Secondary | ICD-10-CM | POA: Diagnosis not present

## 2022-05-24 DIAGNOSIS — I5033 Acute on chronic diastolic (congestive) heart failure: Secondary | ICD-10-CM | POA: Diagnosis not present

## 2022-05-24 DIAGNOSIS — Z6833 Body mass index (BMI) 33.0-33.9, adult: Secondary | ICD-10-CM

## 2022-05-24 DIAGNOSIS — I872 Venous insufficiency (chronic) (peripheral): Secondary | ICD-10-CM | POA: Diagnosis not present

## 2022-05-24 DIAGNOSIS — L97921 Non-pressure chronic ulcer of unspecified part of left lower leg limited to breakdown of skin: Secondary | ICD-10-CM | POA: Diagnosis not present

## 2022-05-24 DIAGNOSIS — Z9981 Dependence on supplemental oxygen: Secondary | ICD-10-CM

## 2022-05-24 DIAGNOSIS — F419 Anxiety disorder, unspecified: Secondary | ICD-10-CM | POA: Diagnosis not present

## 2022-05-24 DIAGNOSIS — D500 Iron deficiency anemia secondary to blood loss (chronic): Secondary | ICD-10-CM | POA: Diagnosis not present

## 2022-05-24 DIAGNOSIS — G4733 Obstructive sleep apnea (adult) (pediatric): Secondary | ICD-10-CM | POA: Diagnosis not present

## 2022-05-24 DIAGNOSIS — I110 Hypertensive heart disease with heart failure: Secondary | ICD-10-CM | POA: Diagnosis not present

## 2022-05-24 DIAGNOSIS — I350 Nonrheumatic aortic (valve) stenosis: Secondary | ICD-10-CM

## 2022-05-24 DIAGNOSIS — J449 Chronic obstructive pulmonary disease, unspecified: Secondary | ICD-10-CM | POA: Diagnosis not present

## 2022-05-24 DIAGNOSIS — J9611 Chronic respiratory failure with hypoxia: Secondary | ICD-10-CM | POA: Diagnosis not present

## 2022-05-26 ENCOUNTER — Inpatient Hospital Stay: Payer: PPO | Attending: Oncology

## 2022-05-26 ENCOUNTER — Encounter: Payer: Self-pay | Admitting: Oncology

## 2022-05-26 ENCOUNTER — Inpatient Hospital Stay: Payer: PPO

## 2022-05-26 ENCOUNTER — Inpatient Hospital Stay (HOSPITAL_BASED_OUTPATIENT_CLINIC_OR_DEPARTMENT_OTHER): Payer: PPO | Admitting: Oncology

## 2022-05-26 VITALS — BP 126/64 | HR 85 | Temp 98.0°F | Resp 18 | Ht 70.0 in | Wt 222.0 lb

## 2022-05-26 DIAGNOSIS — E538 Deficiency of other specified B group vitamins: Secondary | ICD-10-CM

## 2022-05-26 DIAGNOSIS — D631 Anemia in chronic kidney disease: Secondary | ICD-10-CM | POA: Diagnosis not present

## 2022-05-26 DIAGNOSIS — Z79899 Other long term (current) drug therapy: Secondary | ICD-10-CM | POA: Insufficient documentation

## 2022-05-26 DIAGNOSIS — D649 Anemia, unspecified: Secondary | ICD-10-CM

## 2022-05-26 DIAGNOSIS — N189 Chronic kidney disease, unspecified: Secondary | ICD-10-CM | POA: Insufficient documentation

## 2022-05-26 LAB — CBC WITH DIFFERENTIAL/PLATELET
Abs Immature Granulocytes: 0.03 10*3/uL (ref 0.00–0.07)
Basophils Absolute: 0.1 10*3/uL (ref 0.0–0.1)
Basophils Relative: 1 %
Eosinophils Absolute: 0.3 10*3/uL (ref 0.0–0.5)
Eosinophils Relative: 4 %
HCT: 34.7 % — ABNORMAL LOW (ref 39.0–52.0)
Hemoglobin: 9.9 g/dL — ABNORMAL LOW (ref 13.0–17.0)
Immature Granulocytes: 0 %
Lymphocytes Relative: 19 %
Lymphs Abs: 1.4 10*3/uL (ref 0.7–4.0)
MCH: 28.9 pg (ref 26.0–34.0)
MCHC: 28.5 g/dL — ABNORMAL LOW (ref 30.0–36.0)
MCV: 101.5 fL — ABNORMAL HIGH (ref 80.0–100.0)
Monocytes Absolute: 0.4 10*3/uL (ref 0.1–1.0)
Monocytes Relative: 6 %
Neutro Abs: 5.1 10*3/uL (ref 1.7–7.7)
Neutrophils Relative %: 70 %
Platelets: 240 10*3/uL (ref 150–400)
RBC: 3.42 MIL/uL — ABNORMAL LOW (ref 4.22–5.81)
RDW: 14 % (ref 11.5–15.5)
WBC: 7.3 10*3/uL (ref 4.0–10.5)
nRBC: 0 % (ref 0.0–0.2)

## 2022-05-26 LAB — IRON AND TIBC
Iron: 76 ug/dL (ref 45–182)
Saturation Ratios: 31 % (ref 17.9–39.5)
TIBC: 246 ug/dL — ABNORMAL LOW (ref 250–450)
UIBC: 170 ug/dL

## 2022-05-26 LAB — FERRITIN: Ferritin: 76 ng/mL (ref 24–336)

## 2022-05-26 LAB — VITAMIN B12: Vitamin B-12: 441 pg/mL (ref 180–914)

## 2022-05-26 MED ORDER — EPOETIN ALFA-EPBX 40000 UNIT/ML IJ SOLN
40000.0000 [IU] | INTRAMUSCULAR | Status: AC
  Filled 2022-05-26: qty 1

## 2022-05-26 NOTE — Progress Notes (Signed)
Hematology/Oncology Consult note [facility]  Telephone:([phone] Fax:([phone]  Patient Care Team: Tonia Ghent, MD as PCP - General (Family Medicine) Vickie Epley, MD as PCP - Electrophysiology (Cardiology) End, Harrell Gave, MD as PCP - Cardiology (Cardiology) Noralee Space, MD as Consulting Physician (Pulmonary Disease) Nahser, Wonda Cheng, MD as Consulting Physician (Cardiology) Charlton Haws, [facility] as Pharmacist (Pharmacist)   Name of the patient: [name] [name]  [id number]  1934-04-25   Date of visit: 05/26/22  Diagnosis-normocytic anemia possibly a component of chronic kidney disease  Chief complaint/ Reason for visit-routine follow-up of anemia on Retacrit  Heme/Onc history: Patient is a 88 year old male with a past medical history significant for chronic heart failure with preserved EF, obstructive sleep apnea, chronic respiratory failure, COPD, hypertension diabetes BPH among other medical problems.  He has been referred to [place] for anemia.Most recent CBC from 11/26/2021 showed H&H of 8.2/25.4 with an MCV of 95.4.  White count and platelet count were normal.  Serum iron was normal at 66.  Looking back at his prior CBCs his hemoglobin at baseline is between 12-13.  He had drifted down to 10.7 in August 2022 and 8.6 since May 2023.  Baseline creatinine in May 2023 was normal.  Results of blood work from 12/09/2021 were as follows: CBC showed an H&H of 8.8/29.1 with a normal white count and platelet count.  B12 levels were mildly low at 275.  Folate was normal.  Haptoglobin normal.  Myeloma panel showed no M protein.  Both serum kappa and lambda light chains were elevated with a mildly abnormal free light chain ratio of 2.5.  LDH normal.   Patient was started on Retacrit for possible anemia of chronic kidney disease in September 2023.  Baseline creatinineRuns around 1.2.  Interval history-patient has baseline fatigue and exertional shortness  of breath.  Reports some mild improvement in how he feels.  Tolerating Depo injections well so far  ECOG PS- 3 Pain scale- 0   Review of systems- Review of Systems  Constitutional:  Positive for malaise/fatigue. Negative for chills, fever and weight loss.  HENT:  Negative for congestion, ear discharge and nosebleeds.   Eyes:  Negative for blurred vision.  Respiratory:  Positive for shortness of breath. Negative for cough, hemoptysis, sputum production and wheezing.   Cardiovascular:  Negative for chest pain, palpitations, orthopnea and claudication.  Gastrointestinal:  Negative for abdominal pain, blood in stool, constipation, diarrhea, heartburn, melena, nausea and vomiting.  Genitourinary:  Negative for dysuria, flank pain, frequency, hematuria and urgency.  Musculoskeletal:  Negative for back pain, joint pain and myalgias.  Skin:  Negative for rash.  Neurological:  Negative for dizziness, tingling, focal weakness, seizures, weakness and headaches.  Endo/Heme/Allergies:  Does not bruise/bleed easily.  Psychiatric/Behavioral:  Negative for depression and suicidal ideas. The patient does not have insomnia.       Allergies  Allergen Reactions   Iohexol Hives and Swelling    Pt reports swelling, redness, hives, and blisters    Atorvastatin     REACTION: aches   Celecoxib     REACTION: rash   Clinoril [Sulindac]     REACTION: rash   Nitrofurantoin    Spiriva Handihaler [Tiotropium Bromide Monohydrate] Other (See Comments)    Dry mouth   Sulfa Antibiotics     Intolerant but unrecalled.      Past Medical History:  Diagnosis Date   AAA (abdominal aortic aneurysm) (HCC)    a. 3.8  cm CTA 05/2015 b. 3.9 cm by [place] in 11/2017   Anxiety    COPD (chronic obstructive pulmonary disease) ([place])    on home O2 4LPM   Diverticulosis    Former smoker    quit 2001   Hemorrhoids    Kidney stones    passed spontaneously   OSA on CPAP    last study- in the home possibility- 2015, pt.  unsure    Prediabetes    Hgb A1C 5.8 in Jan 2017   PUD (peptic ulcer disease)    Right knee DJD 10/31/2011   Severe   S/P TAVR (transcatheter aortic valve replacement) 06/26/2015   26 mm Edwards Sapien 3 transcatheter heart valve placed via percutaneous right transfemoral approach   Severe aortic stenosis    a. Echo 11/16:  Mod LVH, EF 55-60%, no RWMA, Gr 1 DD, severe AS (mean 51 mmHg; peak 86 mmHg), MAC, mild LAE  b. s/p TAVR on 06/26/2015 with 73m Edward Sapien 3 THV   Shortness of breath dyspnea      Past Surgical History:  Procedure Laterality Date   CARDIAC CATHETERIZATION N/A 05/15/2015   Procedure: Right/Left Heart Cath and Coronary Angiography;  Surgeon: MSherren Mocha MD;  [facility];  Service: Cardiovascular;  Laterality: N/A;   CHOLECYSTECTOMY     EYE SURGERY     bilateral cataracts removed, /w IOL   KNEE SURGERY Right 1985   arthrosopic -    TEE WITHOUT CARDIOVERSION N/A 06/26/2015   Procedure: TRANSESOPHAGEAL ECHOCARDIOGRAM (TEE);  Surgeon: MSherren Mocha MD;  [facility]  Service: Open Heart Surgery;  Laterality: N/A;   TONSILLECTOMY     TRANSCATHETER AORTIC VALVE REPLACEMENT, TRANSFEMORAL N/A 06/26/2015   Procedure: TRANSCATHETER AORTIC VALVE REPLACEMENT, TRANSFEMORAL;  Surgeon: MSherren Mocha MD;  [facility]  Service: Open Heart Surgery;  Laterality: N/A;    Social History   Socioeconomic History   Marital status: Married    Spouse name: Not on file   Number of children: 1   Years of education: Not on file   Highest education level: Not on file  Occupational History   Occupation: Retired    Comment: [employer] 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     Social Determinants of Health   Financial Resource Strain: Low Risk  (12/16/2021)   Overall Financial Resource Strain (CARDIA)    Difficulty of Paying Living Expenses: Not hard at all  Food Insecurity: No Food Insecurity (02/13/2022)   Hunger Vital Sign    Worried About Running Out of Food in the Last Year: Never true    Ran Out of Food in the Last Year: Never true  Transportation Needs: No Transportation Needs (02/13/2022)   PRAPARE - THydrologist(  Medical): No    Lack of Transportation (Non-Medical): No  Physical Activity: Sufficiently Active (12/16/2021)   Exercise Vital Sign    Days of Exercise per Week: 3 days    Minutes of Exercise per Session: 60 min  Stress: No Stress Concern Present (11/02/2019)   [place]    Feeling of Stress : Not at all  Social Connections: [place] (12/16/2021)   Social Connection and Isolation Panel [NHANES]    Frequency of Communication with Friends and Family: More than three times a week    Frequency of Social Gatherings with Friends and Family: More than three times a week    Attends Religious Services: More than 4 times per year    Active Member of [employer] or Organizations: Yes    Attends Music therapist: More than 4 times per year    Marital Status: Married  Human resources officer Violence: Not At Risk (12/16/2021)   Humiliation, Afraid, Rape, and Kick questionnaire    Fear of Current or Ex-Partner: No    Emotionally Abused: No    Physically Abused: No    Sexually Abused: No    Family History  Problem Relation Age of Onset   Stroke Mother    Heart attack Mother 39        deceased   Prostate cancer Father 70   Prostate cancer Brother    Colon cancer Neg Hx      Current Outpatient Medications:    albuterol (PROAIR HFA) 108 (90 Base) MCG/ACT inhaler, INHALE 1-2 PUFFS INTO THE LUNGS EVERY 6 (SIX) HOURS AS NEEDED FOR WHEEZING., Disp: 8.5 g, Rfl: 5   aspirin EC 81 MG tablet, Take 1 tablet (81 mg total) by mouth daily., Disp: , Rfl:    furosemide (LASIX) 40 MG tablet, TAKE 1 TABLET BY MOUTH EVERY DAY, Disp: 90 tablet, Rfl: 0   Iron, Ferrous Sulfate, 325 (65 Fe) MG TABS, Take 325 mg by mouth daily., Disp: , Rfl:    LORazepam (ATIVAN) 1 MG tablet, TAKE 1/4 TO 1/2 TABLET BY MOUTH EVERY 6 HOURS AS NEEDED, Disp: 30 tablet, Rfl: 1   OXYGEN, Inhale into the lungs. 5 Liters, Disp: , Rfl:    Respiratory Therapy Supplies (FLUTTER) DEVI, Use as directed, Disp: 1 each, Rfl: 0   SYMBICORT 160-4.5 MCG/ACT inhaler, TAKE 2 PUFFS BY MOUTH TWICE A DAY, Disp: 30.6 each, Rfl: 3   Syringe/Needle, Disp, (SYRINGE 3CC/25GX1") 25G X 1" 3 ML MISC, 1 mL by Does not apply route once a week., Disp: 100 each, Rfl: 1   cyanocobalamin (VITAMIN B12) 1000 MCG/ML injection, INJECT 1 ML (1,000 MCG TOTAL) INTO THE MUSCLE ONCE A WEEK. (Patient not taking: Reported on 04/09/2022), Disp: 4 mL, Rfl: 0   Wound Dressings (CURITY UNNA BOOT) MISC, Dispense 4 rolls of unna boot wrap to use on legs with coban. (Patient not taking: Reported on 04/09/2022), Disp: 4 each, Rfl: 2 No current facility-administered medications for this visit.  Facility-Administered Medications Ordered in Other Visits:    epoetin alfa-epbx (RETACRIT) injection 40,000 Units, 40,000 Units, Subcutaneous, Q21 days, Sindy Guadeloupe, MD  Physical exam:  Vitals:   05/26/22 1050  BP: 126/64  Pulse: 85  Resp: 18  Temp: 98 F (36.7 C)  TempSrc: Tympanic  SpO2: 97%  Weight: 222 lb (100.7 kg)  Height: 5' 10" (1.778 m)   Physical Exam Constitutional:      Comments: Sitting in a wheelchair on home oxygen.  Appears fatigued   Cardiovascular:     Rate and Rhythm: Normal rate and regular rhythm.     Heart sounds: Normal heart sounds.  Pulmonary:     Effort: Pulmonary effort is normal.     Breath sounds: Normal breath sounds.  Abdominal:     General: Bowel sounds are normal.     Palpations: Abdomen is soft.  Skin:    General: Skin is warm and dry.  Neurological:     Mental Status: He is alert and oriented to person, place, and time.         Latest Ref Rng & Units 12/09/2021    3:14 PM  CMP  Glucose 70 - 99 mg/dL 115   BUN 8 - 23 mg/dL 25   Creatinine 0.61 - 1.24 mg/dL 1.22   Sodium 135 - 145 mmol/L 140   Potassium 3.5 - 5.1 mmol/L 3.7   Chloride 98 - 111 mmol/L 94   CO2 22 - 32 mmol/L 38   Calcium 8.9 - 10.3 mg/dL 8.4   Total Protein 6.5 - 8.1 g/dL 6.8   Total Bilirubin 0.3 - 1.2 mg/dL 0.7   Alkaline Phos 38 - 126 U/L 107   AST 15 - 41 U/L 22   ALT 0 - 44 U/L 12       Latest Ref Rng & Units 05/26/2022   10:33 AM  CBC  WBC 4.0 - 10.5 K/uL 7.3   Hemoglobin 13.0 - 17.0 g/dL 9.9   Hematocrit 39.0 - 52.0 % 34.7   Platelets 150 - 400 K/uL 240      Assessment and plan- Patient is a 88 y.o. male Here for routine follow-up of normocytic anemia  Anemia attributed to a component of chronic kidney disease.  His creatinine has been around 1.2.  After starting EPO patient's hemoglobin is improved from 8.2-9.9.  Peripheral blood anemia workup has otherwise been unremarkable.  Given his age and chronic underlying comorbidities holding off on bone marrow biopsy at this time.  If there is no significant improvement in his anemia despite need for injections we will have to consider a bone marrow biopsy at that time.  His serum free light chain ratio was mildly abnormal at 2.5 which we will also keep an eye on.  Continue H&H and Retacrit every 3 weeks to keep hemoglobin between 10-11.  CBC ferritin and iron studies and serum free light chains in 3 months and I will see him thereafter   Visit Diagnosis 1.  Normocytic anemia   2. B12 deficiency   3. Erythropoietin (EPO) stimulating agent anemia management patient      Dr. Randa Evens, MD, MPH [facility] [id number] 05/26/2022 1:00 PM

## 2022-05-28 ENCOUNTER — Encounter (INDEPENDENT_AMBULATORY_CARE_PROVIDER_SITE_OTHER): Payer: Self-pay

## 2022-05-28 ENCOUNTER — Ambulatory Visit (INDEPENDENT_AMBULATORY_CARE_PROVIDER_SITE_OTHER): Payer: Self-pay | Admitting: Nurse Practitioner

## 2022-05-29 ENCOUNTER — Ambulatory Visit (INDEPENDENT_AMBULATORY_CARE_PROVIDER_SITE_OTHER): Payer: Self-pay | Admitting: Nurse Practitioner

## 2022-05-29 ENCOUNTER — Encounter (INDEPENDENT_AMBULATORY_CARE_PROVIDER_SITE_OTHER): Payer: Self-pay

## 2022-05-30 ENCOUNTER — Other Ambulatory Visit: Payer: Self-pay

## 2022-05-30 ENCOUNTER — Emergency Department: Payer: PPO

## 2022-05-30 ENCOUNTER — Inpatient Hospital Stay
Admission: EM | Admit: 2022-05-30 | Discharge: 2022-06-01 | DRG: 190 | Disposition: A | Discharge status: Home Health | Payer: PPO | Attending: Internal Medicine | Admitting: Internal Medicine

## 2022-05-30 ENCOUNTER — Telehealth: Payer: Self-pay | Admitting: Family Medicine

## 2022-05-30 DIAGNOSIS — Z888 Allergy status to other drugs, medicaments and biological substances status: Secondary | ICD-10-CM | POA: Diagnosis not present

## 2022-05-30 DIAGNOSIS — G9341 Metabolic encephalopathy: Secondary | ICD-10-CM | POA: Diagnosis not present

## 2022-05-30 DIAGNOSIS — M1711 Unilateral primary osteoarthritis, right knee: Secondary | ICD-10-CM | POA: Diagnosis present

## 2022-05-30 DIAGNOSIS — I739 Peripheral vascular disease, unspecified: Secondary | ICD-10-CM | POA: Diagnosis present

## 2022-05-30 DIAGNOSIS — Z953 Presence of xenogenic heart valve: Secondary | ICD-10-CM | POA: Diagnosis not present

## 2022-05-30 DIAGNOSIS — J9611 Chronic respiratory failure with hypoxia: Secondary | ICD-10-CM | POA: Diagnosis not present

## 2022-05-30 DIAGNOSIS — I491 Atrial premature depolarization: Secondary | ICD-10-CM | POA: Diagnosis not present

## 2022-05-30 DIAGNOSIS — J9622 Acute and chronic respiratory failure with hypercapnia: Secondary | ICD-10-CM | POA: Diagnosis present

## 2022-05-30 DIAGNOSIS — Z87442 Personal history of urinary calculi: Secondary | ICD-10-CM | POA: Diagnosis not present

## 2022-05-30 DIAGNOSIS — Z882 Allergy status to sulfonamides status: Secondary | ICD-10-CM

## 2022-05-30 DIAGNOSIS — G4733 Obstructive sleep apnea (adult) (pediatric): Secondary | ICD-10-CM | POA: Diagnosis present

## 2022-05-30 DIAGNOSIS — Z7982 Long term (current) use of aspirin: Secondary | ICD-10-CM

## 2022-05-30 DIAGNOSIS — Z7951 Long term (current) use of inhaled steroids: Secondary | ICD-10-CM | POA: Diagnosis not present

## 2022-05-30 DIAGNOSIS — J9692 Respiratory failure, unspecified with hypercapnia: Secondary | ICD-10-CM | POA: Diagnosis present

## 2022-05-30 DIAGNOSIS — Z87891 Personal history of nicotine dependence: Secondary | ICD-10-CM | POA: Diagnosis not present

## 2022-05-30 DIAGNOSIS — Z20822 Contact with and (suspected) exposure to covid-19: Secondary | ICD-10-CM | POA: Diagnosis not present

## 2022-05-30 DIAGNOSIS — I5032 Chronic diastolic (congestive) heart failure: Secondary | ICD-10-CM | POA: Diagnosis not present

## 2022-05-30 DIAGNOSIS — I714 Abdominal aortic aneurysm, without rupture, unspecified: Secondary | ICD-10-CM | POA: Diagnosis present

## 2022-05-30 DIAGNOSIS — E8729 Other acidosis: Secondary | ICD-10-CM | POA: Diagnosis present

## 2022-05-30 DIAGNOSIS — J441 Chronic obstructive pulmonary disease with (acute) exacerbation: Secondary | ICD-10-CM | POA: Diagnosis not present

## 2022-05-30 DIAGNOSIS — Z66 Do not resuscitate: Secondary | ICD-10-CM | POA: Diagnosis present

## 2022-05-30 DIAGNOSIS — N189 Chronic kidney disease, unspecified: Secondary | ICD-10-CM | POA: Diagnosis not present

## 2022-05-30 DIAGNOSIS — F411 Generalized anxiety disorder: Secondary | ICD-10-CM | POA: Diagnosis not present

## 2022-05-30 DIAGNOSIS — Z8711 Personal history of peptic ulcer disease: Secondary | ICD-10-CM | POA: Diagnosis not present

## 2022-05-30 DIAGNOSIS — J9602 Acute respiratory failure with hypercapnia: Secondary | ICD-10-CM | POA: Diagnosis not present

## 2022-05-30 DIAGNOSIS — R0689 Other abnormalities of breathing: Secondary | ICD-10-CM | POA: Diagnosis not present

## 2022-05-30 DIAGNOSIS — L89152 Pressure ulcer of sacral region, stage 2: Secondary | ICD-10-CM | POA: Diagnosis not present

## 2022-05-30 DIAGNOSIS — R4182 Altered mental status, unspecified: Secondary | ICD-10-CM | POA: Diagnosis not present

## 2022-05-30 DIAGNOSIS — G4710 Hypersomnia, unspecified: Secondary | ICD-10-CM | POA: Diagnosis present

## 2022-05-30 DIAGNOSIS — R410 Disorientation, unspecified: Secondary | ICD-10-CM | POA: Diagnosis not present

## 2022-05-30 DIAGNOSIS — Z8249 Family history of ischemic heart disease and other diseases of the circulatory system: Secondary | ICD-10-CM | POA: Diagnosis not present

## 2022-05-30 DIAGNOSIS — R442 Other hallucinations: Secondary | ICD-10-CM | POA: Diagnosis not present

## 2022-05-30 DIAGNOSIS — R0602 Shortness of breath: Secondary | ICD-10-CM | POA: Diagnosis not present

## 2022-05-30 DIAGNOSIS — Z9981 Dependence on supplemental oxygen: Secondary | ICD-10-CM

## 2022-05-30 DIAGNOSIS — I5033 Acute on chronic diastolic (congestive) heart failure: Secondary | ICD-10-CM | POA: Diagnosis present

## 2022-05-30 LAB — BLOOD GAS, VENOUS
Acid-Base Excess: 27.4 mmol/L — ABNORMAL HIGH (ref 0.0–2.0)
Acid-Base Excess: 27.6 mmol/L — ABNORMAL HIGH (ref 0.0–2.0)
Bicarbonate: 59.5 mmol/L — ABNORMAL HIGH (ref 20.0–28.0)
Bicarbonate: 58.4 mmol/L — ABNORMAL HIGH (ref 20.0–28.0)
Delivery systems: POSITIVE
FIO2: 40 %
O2 Saturation: 62.4 %
O2 Saturation: 59.4 %
Patient temperature: 37
Patient temperature: 37
RATE: 12 resp/min
pCO2, Ven: 103 mmHg (ref 44–60)
pCO2, Ven: 90 mmHg (ref 44–60)
pH, Ven: 7.37 (ref 7.25–7.43)
pH, Ven: 7.42 (ref 7.25–7.43)
pO2, Ven: 44 mmHg (ref 32–45)
pO2, Ven: 42 mmHg (ref 32–45)

## 2022-05-30 LAB — URINALYSIS, ROUTINE W REFLEX MICROSCOPIC
Bilirubin Urine: NEGATIVE
Glucose, UA: NEGATIVE mg/dL
Hgb urine dipstick: NEGATIVE
Ketones, ur: NEGATIVE mg/dL
Leukocytes,Ua: NEGATIVE
Nitrite: NEGATIVE
Protein, ur: NEGATIVE mg/dL
Specific Gravity, Urine: 1.01 (ref 1.005–1.030)
pH: 5 (ref 5.0–8.0)

## 2022-05-30 LAB — COMPREHENSIVE METABOLIC PANEL
ALT: 12 U/L (ref 0–44)
AST: 19 U/L (ref 15–41)
Albumin: 3.2 g/dL — ABNORMAL LOW (ref 3.5–5.0)
Alkaline Phosphatase: 99 U/L (ref 38–126)
BUN: 25 mg/dL — ABNORMAL HIGH (ref 8–23)
CO2: >45 mmol/L — ABNORMAL HIGH (ref 22–32)
Calcium: 9 mg/dL (ref 8.9–10.3)
Chloride: 84 mmol/L — ABNORMAL LOW (ref 98–111)
Creatinine, Ser: 0.93 mg/dL (ref 0.61–1.24)
GFR, Estimated: >60 mL/min (ref >60)
Glucose, Bld: 95 mg/dL (ref 70–99)
Potassium: 3.8 mmol/L (ref 3.5–5.1)
Sodium: 140 mmol/L (ref 135–145)
Total Bilirubin: 1 mg/dL (ref 0.3–1.2)
Total Protein: 7.5 g/dL (ref 6.5–8.1)

## 2022-05-30 LAB — BRAIN NATRIURETIC PEPTIDE: B Natriuretic Peptide: 74.8 pg/mL (ref 0.0–100.0)

## 2022-05-30 LAB — CBC
HCT: 36.4 % — ABNORMAL LOW (ref 39.0–52.0)
Hemoglobin: 10.1 g/dL — ABNORMAL LOW (ref 13.0–17.0)
MCH: 29.2 pg (ref 26.0–34.0)
MCHC: 27.7 g/dL — ABNORMAL LOW (ref 30.0–36.0)
MCV: 105.2 fL — ABNORMAL HIGH (ref 80.0–100.0)
Platelets: 222 10*3/uL (ref 150–400)
RBC: 3.46 MIL/uL — ABNORMAL LOW (ref 4.22–5.81)
RDW: 14.1 % (ref 11.5–15.5)
WBC: 8.8 10*3/uL (ref 4.0–10.5)
nRBC: 0 % (ref 0.0–0.2)

## 2022-05-30 LAB — TROPONIN I (HIGH SENSITIVITY): Troponin I (High Sensitivity): 17 ng/L (ref <18)

## 2022-05-30 LAB — RESP PANEL BY RT-PCR (RSV, FLU A&B, COVID)  RVPGX2
Influenza A by PCR: NEGATIVE
Influenza B by PCR: NEGATIVE
Resp Syncytial Virus by PCR: NEGATIVE
SARS Coronavirus 2 by RT PCR: NEGATIVE

## 2022-05-30 MED ORDER — METHYLPREDNISOLONE SODIUM SUCC 125 MG IJ SOLR
125.0000 mg | Freq: Two times a day (BID) | INTRAMUSCULAR | Status: AC
  Administered 2022-05-31 (×2): 125 mg via INTRAVENOUS
  Filled 2022-05-30 (×2): qty 2

## 2022-05-30 MED ORDER — ASPIRIN 81 MG PO TBEC
81.0000 mg | DELAYED_RELEASE_TABLET | Freq: Every day | ORAL | Status: DC
  Administered 2022-05-31 – 2022-06-01 (×2): 81 mg via ORAL
  Filled 2022-05-30 (×2): qty 1

## 2022-05-30 MED ORDER — MOMETASONE FURO-FORMOTEROL FUM 200-5 MCG/ACT IN AERO: 2.0000 | INHALATION_SPRAY | Freq: Two times a day (BID) | RESPIRATORY_TRACT | Status: DC

## 2022-05-30 MED ORDER — IPRATROPIUM-ALBUTEROL 0.5-2.5 (3) MG/3ML IN SOLN
3.0000 mL | Freq: Once | RESPIRATORY_TRACT | Status: AC
  Administered 2022-05-30: 3 mL via RESPIRATORY_TRACT
  Filled 2022-05-30: qty 3

## 2022-05-30 MED ORDER — ACETAMINOPHEN 325 MG PO TABS: 650.0000 mg | ORAL_TABLET | Freq: Four times a day (QID) | ORAL | Status: DC | PRN

## 2022-05-30 MED ORDER — METHYLPREDNISOLONE SODIUM SUCC 125 MG IJ SOLR
125.0000 mg | Freq: Once | INTRAMUSCULAR | Status: AC
  Administered 2022-05-30: 125 mg via INTRAVENOUS
  Filled 2022-05-30: qty 2

## 2022-05-30 MED ORDER — ALBUTEROL SULFATE (2.5 MG/3ML) 0.083% IN NEBU: 2.5000 mg | INHALATION_SOLUTION | RESPIRATORY_TRACT | Status: DC | PRN

## 2022-05-30 MED ORDER — MORPHINE SULFATE (PF) 2 MG/ML IV SOLN: 2.0000 mg | INTRAVENOUS | Status: DC | PRN

## 2022-05-30 MED ORDER — HEPARIN SODIUM (PORCINE) 5000 UNIT/ML IJ SOLN
5000.0000 [IU] | Freq: Three times a day (TID) | INTRAMUSCULAR | Status: DC
  Administered 2022-05-30 – 2022-06-01 (×5): 5000 [IU] via SUBCUTANEOUS
  Filled 2022-05-30 (×5): qty 1

## 2022-05-30 MED ORDER — ACETAMINOPHEN 650 MG RE SUPP: 650.0000 mg | Freq: Four times a day (QID) | RECTAL | Status: DC | PRN

## 2022-05-30 MED ORDER — ONDANSETRON HCL 4 MG/2ML IJ SOLN: 4.0000 mg | Freq: Four times a day (QID) | INTRAMUSCULAR | Status: DC | PRN

## 2022-05-30 MED ORDER — ONDANSETRON HCL 4 MG PO TABS: 4.0000 mg | ORAL_TABLET | Freq: Four times a day (QID) | ORAL | Status: DC | PRN

## 2022-05-30 MED ORDER — PREDNISONE 20 MG PO TABS
40.0000 mg | ORAL_TABLET | Freq: Every day | ORAL | Status: DC
  Administered 2022-06-01: 40 mg via ORAL
  Filled 2022-05-30: qty 2

## 2022-05-30 MED ORDER — IPRATROPIUM-ALBUTEROL 0.5-2.5 (3) MG/3ML IN SOLN
3.0000 mL | Freq: Four times a day (QID) | RESPIRATORY_TRACT | Status: DC
  Administered 2022-05-30 – 2022-06-01 (×6): 3 mL via RESPIRATORY_TRACT
  Filled 2022-05-30 (×6): qty 3

## 2022-05-30 MED ORDER — OXYCODONE HCL 5 MG PO TABS: 5.0000 mg | ORAL_TABLET | ORAL | Status: DC | PRN

## 2022-05-30 MED ORDER — FUROSEMIDE 40 MG PO TABS
40.0000 mg | ORAL_TABLET | Freq: Every day | ORAL | Status: DC
  Administered 2022-05-31 – 2022-06-01 (×2): 40 mg via ORAL
  Filled 2022-05-30 (×2): qty 1

## 2022-05-30 NOTE — ED Triage Notes (Signed)
Pt comes via EMS from home with c/o 3 days of AMs. Pt has had SOb and weakness. Pt doesn't have hx of dementia. Pt wears O2 at home and forgot to turn on and O2 was 63 % RA. Pt does wear 5L [place] normally.  Pt was also hallucinating last night. Home health nurse reported fluid in lungs. Pt has hx of CHF and takes lasix.  Pt was oriented X3 with EMs. Chance of UTI with hx of same.   166/74 94 % 4L 82/HR CBG-125 T-98.6 Sinus with PVCs

## 2022-05-30 NOTE — ED Triage Notes (Signed)
Triage obtained through EMR, patient is a poor historian and is confused - Per EMR notes from telephone triage, patient's daughter called stating "her father home aid nurse heard fluid around his lungs. She also states he has been weak and fatigue."

## 2022-05-30 NOTE — Assessment & Plan Note (Signed)
-  Holding benzo in the setting of COPD E with hypercapnia

## 2022-05-30 NOTE — Assessment & Plan Note (Signed)
-  CT head is without evidence of acute intracranial abnormality -PCO2 > 100 -Continue treatment as per resp fail with hypercapnia

## 2022-05-30 NOTE — Telephone Encounter (Signed)
Please give the order.  Thanks.  If worsening in the meantime, needs eval.

## 2022-05-30 NOTE — ED Provider Triage Note (Signed)
Emergency Medicine Provider Triage Evaluation Note  [name] [name] , a 88 y.o. male  was evaluated in triage.  Pt complains of 3 days of altered mental status per EMS report from daughter. History of dementia. Home health nurse reports CHF and is concerned he has "fluid in his lungs. Patient denies chest pain.  Physical Exam  BP 128/73   Pulse 81   Temp 98.1 F (36.7 C)   Resp 19   Ht '5\' 10"'$  (1.778 m)   Wt 100.7 kg   SpO2 96%   BMI 31.85 kg/m  Gen:   Awake, no distress   Resp:  Normal effort  MSK:   Moves extremities without difficulty  Other:    Medical Decision Making  Medically screening exam initiated at 4:50 PM.  Appropriate orders placed.  Desiree Lucy Lucente was informed that the remainder of the evaluation will be completed by another provider, this initial triage assessment does not replace that evaluation, and the importance of remaining in the ED until their evaluation is complete.    Victorino Dike, FNP 05/30/22 [phone]

## 2022-05-30 NOTE — Assessment & Plan Note (Signed)
-  Continue home diuretic -CXR neg for acute cardiopulm disease -Peripheral edema is at baseline -Continue to monitor

## 2022-05-30 NOTE — Telephone Encounter (Signed)
[name] [name] from [facility] called & stated she was told by the pt's wife that the pt is experiencing some weakness & confusion, pt's wife thinks it could be from a possible UTI. Davy Pique is asking for orders to collect a urine sample from pt? Davy Pique mentioned she will be visiting the pt today, 12/15 around 1pm & is requesting for order to be given by then. Call back # [phone], secured

## 2022-05-30 NOTE — Telephone Encounter (Signed)
[facility] - Client TELEPHONE ADVICE RECORD [facility] Patient Name: [name] [name] Gender: Male DOB: 12/13/1933 Age: 88 Y 17 M 23 D Return Phone Number: [phone] (Primary), [phone] (Secondary) Address: City/ State/ Zip: [place]  [address] Client [facility] - Client Client Site [facility] - Day Provider Renford Dills - MD Contact Type Call Who Is Calling Patient / Member / Family / Caregiver Call Type Triage / Clinical Caller Name Raj Janus Relationship To Patient Daughter Return Phone Number [phone] (Primary) Chief Complaint Blood and Body Fluid Exposure Reason for Call Symptomatic / Request for [place] states her father home aid nurse heard fluid around his lungs. She also states he has been weak and fatigue. Translation No No Triage Reason Other Nurse Assessment Nurse: Schreffler, RN, Joelene Millin Date/Time (Eastern Time): 05/30/2022 1:56:07 PM Confirm and document reason for call. If symptomatic, describe symptoms. ---Caller states she is not with him, but [facility] called her to say that her father heard " fluid on his lungs". States that father needs to be seen and called the office to be seen today and they have no appt. available. Unable to do proper triage at this time. Does the patient have any new or worsening symptoms? ---Yes Will a triage be completed? ---No Select reason for no triage. ---Other Please document clinical information provided and list any resource used. ---Advised Caller to call back when she is with the Pt. for a tele triage can be performed or to seek UC/ED if Pt. is having CP/SOB when she arrives to see him. caller verbalized understanding Disp. Time Eilene Ghazi Time) Disposition Final User 05/30/2022 2:02:57 PM Clinical Call Yes Schreffler, RN, Joelene Millin Final Disposition 05/30/2022 2:02:57 PM Clinical Call Yes  Schreffler, RN, Sharlyne Pacas

## 2022-05-30 NOTE — H&P (Signed)
History and Physical    Patient: [name] [name] KKX:[id number] DOB: May 21, 1934 DOA: 05/30/2022 DOS: the patient was seen and examined on 05/30/2022 PCP: Tonia Ghent, MD  Patient coming from: Home  Chief Complaint:  Chief Complaint  Patient presents with   Altered Mental Status    Triage obtained through EMR, patient is a poor historian and is confused - Per EMR notes from telephone triage, patient's daughter called stating "her father home aid nurse heard fluid around his lungs. She also states he has been weak and fatigue."    HPI: [name] [name] is a 88 y.o. male with medical history significant of AAA, anxiety, COPD, former smoker, OSA on CPAP, PVD, CHF, and more presents to ED with a chief complaint of not feeling well for 5 days.  Patient's family provides most the history.  They report that he had nausea without vomiting and decreased appetite starting Sunday.  He has been fatigued and hypersomnolent.  He reported he has been sleeping a lot, and having gradually worsening weakness.  They report that he was too weak to get up from the commode last night.  He has not been coughing.  Family reports that he has not been wheezing, but patient reports that he has been wheezing and that he has been using his rescue inhaler 3 times a day.  He has not had a fever.  He developed some myalgias including right shoulder pain 3 days ago.  He had no sick contacts.  They report that his lack of appetite is a significant change for him as he normally has a very healthy appetite.  Patient does have home health and home physical therapy.  They have been wrapping his legs and attending to a pressure ulcer.  The home health nurse noticed that patient was much weaker and not acting his normal self when compared to last week.  Patient reports no other complaints at this time.  Patient does not smoke, does not drink.  He is vaccinated for COVID.  Patient is DNR, DNI. Review of Systems: As mentioned in the history  of present illness. All other systems reviewed and are negative. Past Medical History:  Diagnosis Date   AAA (abdominal aortic aneurysm) ([place])    a. 3.8 cm CTA 05/2015 b. 3.9 cm by [place] in 11/2017   Anxiety    COPD (chronic obstructive pulmonary disease) ([place])    on home O2 4LPM   Diverticulosis    Former smoker    quit 2001   Hemorrhoids    Kidney stones    passed spontaneously   OSA on CPAP    last study- in the home possibility- 2015, pt. unsure    Prediabetes    Hgb A1C 5.8 in Jan 2017   PUD (peptic ulcer disease)    Right knee DJD 10/31/2011   Severe   S/P TAVR (transcatheter aortic valve replacement) 06/26/2015   26 mm Edwards Sapien 3 transcatheter heart valve placed via percutaneous right transfemoral approach   Severe aortic stenosis    a. Echo 11/16:  Mod LVH, EF 55-60%, no RWMA, Gr 1 DD, severe AS (mean 51 mmHg; peak 86 mmHg), MAC, mild LAE  b. s/p TAVR on 06/26/2015 with 92m Edward Sapien 3 THV   Shortness of breath dyspnea    Past Surgical History:  Procedure Laterality Date   CARDIAC CATHETERIZATION N/A 05/15/2015   Procedure: Right/Left Heart Cath and Coronary Angiography;  Surgeon: MSherren Mocha MD;  Location[facility];  Service: Cardiovascular;  Laterality: N/A;   CHOLECYSTECTOMY     EYE SURGERY     bilateral cataracts removed, /w IOL   KNEE SURGERY Right 1985   arthrosopic -    TEE WITHOUT CARDIOVERSION N/A 06/26/2015   Procedure: TRANSESOPHAGEAL ECHOCARDIOGRAM (TEE);  Surgeon: [name], MD;  Location: [facility];  Service: Open Heart Surgery;  Laterality: N/A;   TONSILLECTOMY     TRANSCATHETER AORTIC VALVE REPLACEMENT, TRANSFEMORAL N/A 06/26/2015   Procedure: TRANSCATHETER AORTIC VALVE REPLACEMENT, TRANSFEMORAL;  Surgeon: [name], MD;  Location: [facility];  Service: Open Heart Surgery;  Laterality: N/A;   Social History:  reports that he quit smoking about 22 years ago. His smoking use included cigarettes. He has a 79.50 pack-year smoking history.  He has quit using smokeless tobacco.  His smokeless tobacco use included snuff and chew. He reports that he does not drink alcohol and does not use drugs.  Allergies  Allergen Reactions   Iohexol Hives and Swelling    Pt reports swelling, redness, hives, and blisters    Atorvastatin     REACTION: aches   Celecoxib     REACTION: rash   Clinoril [Sulindac]     REACTION: rash   Nitrofurantoin    Spiriva Handihaler [Tiotropium Bromide Monohydrate] Other (See Comments)    Dry mouth   Sulfa Antibiotics     Intolerant but unrecalled.     Family History  Problem Relation Age of Onset   Stroke Mother    Heart attack Mother 8       deceased   Prostate cancer Father 45   Prostate cancer Brother    Colon cancer Neg Hx     Prior to Admission medications   Medication Sig Start Date End Date Taking? Authorizing Provider  aspirin EC 81 MG tablet Take 1 tablet (81 mg total) by mouth daily. 04/24/15  Yes Weaver, Scott T, PA-C  furosemide (LASIX) 40 MG tablet TAKE 1 TABLET BY MOUTH EVERY DAY 04/04/22  Yes End, Harrell Gave, MD  SYMBICORT 160-4.5 MCG/ACT inhaler TAKE 2 PUFFS BY MOUTH TWICE A DAY 07/01/21  Yes Tonia Ghent, MD  albuterol [facility] HFA) 108 (90 Base) MCG/ACT inhaler INHALE 1-2 PUFFS INTO THE LUNGS EVERY 6 (SIX) HOURS AS NEEDED FOR WHEEZING. 07/08/21   Tonia Ghent, MD  cyanocobalamin (VITAMIN B12) 1000 MCG/ML injection INJECT 1 ML (1,000 MCG TOTAL) INTO THE MUSCLE ONCE A WEEK. Patient not taking: Reported on 04/09/2022 03/31/22   Verlon Au, NP  Iron, Ferrous Sulfate, 325 (65 Fe) MG TABS Take 325 mg by mouth daily. Patient not taking: Reported on 05/30/2022 11/03/21   Tonia Ghent, MD  LORazepam (ATIVAN) 1 MG tablet TAKE 1/4 TO 1/2 TABLET BY MOUTH EVERY 6 HOURS AS NEEDED Patient not taking: Reported on 05/30/2022 10/27/21   Tonia Ghent, MD  OXYGEN Inhale into the lungs. 5 Liters    [provider]  Respiratory Therapy Supplies (FLUTTER) DEVI Use as  directed Patient not taking: Reported on 05/30/2022 02/23/12   Kathee Delton, MD  Syringe/Needle, Disp, (SYRINGE 3CC/25GX1") 25G X 1" 3 ML MISC 1 mL by Does not apply route once a week. Patient not taking: Reported on 05/30/2022 12/23/21   Sindy Guadeloupe, MD  Wound Dressings (CURITY Louretta Parma BOOT) MISC Dispense 4 rolls of unna boot wrap to use on legs with coban. Patient not taking: Reported on 04/09/2022 12/27/21   Tonia Ghent, MD    Physical Exam: Vitals:   05/30/22  2000 05/30/22 2056 05/30/22 2100 05/30/22 2130  BP: (!) 144/101   (!) 133/110  Pulse: 86   94  Resp: (!) 22   (!) 28  Temp:   (!) 97.4 F (36.3 C)   TempSrc:   Axillary   SpO2: 100% 100%  100%  Weight:      Height:       1.  General: Patient lying supine in bed,  no acute distress   2. Psychiatric: Alert and oriented, mood and behavior normal for situation, pleasant and cooperative with exam   3. Neurologic: Speech and language are normal, face is symmetric, moves all 4 extremities voluntarily, at baseline without acute deficits on limited exam   4. HEENMT:  Head is atraumatic, normocephalic, pupils reactive to light, neck is supple, trachea is midline, mucous membranes are moist   5. Respiratory : Wheezing in BL lung fields without rhonchi, rales, no cyanosis, no increase in work of breathing or accessory muscle use, maintaining O2 sats, and alert on BiPAP, clubbing of fingers   6. Cardiovascular : Heart rate normal, rhythm is regular, no murmurs, rubs or gallops,peripheral edema in feet, peripheral pulses palpated   7. Gastrointestinal:  Abdomen is soft, nondistended, nontender to palpation bowel sounds active, no masses or organomegaly palpated   8. Skin:  Venous stasis changes of the lower extremities   9.Musculoskeletal:  No acute deformities or trauma, no asymmetry in tone, peripheral edema in feet, peripheral pulses palpated, no tenderness to palpation in the extremities, clubbing of fingers  Data  Reviewed: In the ED Temp 98.1, heart rate 81-86, respiratory rate 19-30, blood pressure 128/73-147/101, satting at 96% on 4 L Patient is apparently on 5 L nasal cannula baseline at home Blood gas shows a pH of 7.37 and pCO2 of 103 Chemistry shows a bicarb greater than 45 No leukocytosis UA is not indicative of UTI Flu AB, and COVID-negative CT head shows no evidence of acute intracranial abnormality Chest x-ray shows no active cardiopulmonary disease EKG shows sinus rhythm with a rate of 86, QTc 437 Patient received 3 DuoNebs and a dose of Solu-Medrol in the ED Admission requested for respiratory failure with hypercapnia  Assessment and Plan: * Respiratory failure with hypercapnia (HCC) -PCO2 103 -Hypersomnolence and confusion at home -Bicarb > 45 with pH 7.37 - compensated -Improvement with BiPAP -Confirmed DNI status -Continue BiPAP, steroids, breathing treatments -Low suspicion for infection - no antibiotics indicated at this time -Continue to monitor  Acute metabolic encephalopathy -CT head is without evidence of acute intracranial abnormality -PCO2 > 100 -Continue treatment as per resp fail with hypercapnia   Chronic heart failure with preserved ejection fraction (HFpEF) (HCC) -Continue home diuretic -CXR neg for acute cardiopulm disease -Peripheral edema is at baseline -Continue to monitor  COPD with acute exacerbation (HCC) -Wheezing and hypercapnic -Continue steroids, breathing treatments and BiPAP -Continue to monitor  Anxiety state -Holding benzo in the setting of COPD E with hypercapnia      Advance Care Planning:   Code Status: DNR DNI  Consults: None  Family Communication: Daughter and wife at bedside  Severity of Illness: The appropriate patient status for this patient is INPATIENT. Inpatient status is judged to be reasonable and necessary in order to provide the required intensity of service to ensure the patient's safety. The patient's  presenting symptoms, physical exam findings, and initial radiographic and laboratory data in the context of their chronic comorbidities is felt to place them at high risk for further clinical deterioration.  Furthermore, it is not anticipated that the patient will be medically stable for discharge from the hospital within 2 midnights of admission.   * I certify that at the point of admission it is my clinical judgment that the patient will require inpatient hospital care spanning beyond 2 midnights from the point of admission due to high intensity of service, high risk for further deterioration and high frequency of surveillance required.*  Author: Rolla Plate, DO 05/30/2022 10:31 PM  For on call review [email or web address].

## 2022-05-30 NOTE — Assessment & Plan Note (Signed)
-  Wheezing and hypercapnic -Continue steroids, breathing treatments and BiPAP -Continue to monitor

## 2022-05-30 NOTE — ED Notes (Signed)
MD [facility] informed of pt's critical CO2 result.

## 2022-05-30 NOTE — Telephone Encounter (Signed)
Called to give verbal orders to [facility]; she was still with patient. She stated they are sending patient to the hospital as he is having hallucinations, very weak and plus the UTI sx.

## 2022-05-30 NOTE — ED Provider Notes (Signed)
[facility] Provider Note    Event Date/Time   First MD Initiated Contact with Patient 05/30/22 1833     (approximate)   History   Altered Mental Status (Triage obtained through EMR, patient is a poor historian and is confused - Per EMR notes from telephone triage, patient's daughter called stating "her father home aid nurse heard fluid around his lungs. She also states he has been weak and fatigue."/)   HPI  [name] [name] is a 88 y.o. male history of COPD and CHF presents the ER for worsening confusion over the past 3 days health nurse is worried that he is developing fluid on his lungs.  Been compliant with his prescribed medications and diuresis but has not been using his CPAP at night for the past several days.  Family reports that he is very drowsy and slow to answer questions which is new for him.  No reported falls or injuries.  Denies any chest pain.     Physical Exam   Triage Vital Signs: ED Triage Vitals  Enc Vitals Group     BP 05/30/22 1646 128/73     Pulse Rate 05/30/22 1646 81     Resp 05/30/22 1646 19     Temp 05/30/22 1646 98.1 F (36.7 C)     Temp src --      SpO2 05/30/22 1646 96 %     Weight 05/30/22 1647 222 lb 0.1 oz (100.7 kg)     Height 05/30/22 1647 '5\' 10"'$  (1.778 m)     Head Circumference --      Peak Flow --      Pain Score 05/30/22 1648 10     Pain Loc --      Pain Edu? --      Excl. in [place]? --     Most recent vital signs: Vitals:   05/30/22 1915 05/30/22 2000  BP: (!) 147/74 (!) 144/101  Pulse: 82 86  Resp: (!) 30 (!) 22  Temp:    SpO2: 100% 100%     Constitutional: Alert, slightly confused and slow to respond but protecting airway. Eyes: Conjunctivae are normal.  Head: Atraumatic. Nose: No congestion/rhinnorhea. Mouth/Throat: Mucous membranes are moist.   Neck: Painless ROM.  Cardiovascular:   Good peripheral circulation. Respiratory: Mild tachypnea with diminished breath sounds and air  movement. Gastrointestinal: Soft and nontender.  Musculoskeletal:  no deformity Neurologic:  MAE spontaneously. No gross focal neurologic deficits are appreciated.  Skin:  Skin is warm, dry and intact. No rash noted. Psychiatric: Mood and affect are normal. Speech and behavior are normal.    ED Results / Procedures / Treatments   Labs (all labs ordered are listed, but only abnormal results are displayed) Labs Reviewed  COMPREHENSIVE METABOLIC PANEL - Abnormal; Notable for the following components:      Result Value   Chloride 84 (*)    CO2 >45 (*)    BUN 25 (*)    Albumin 3.2 (*)    All other components within normal limits  CBC - Abnormal; Notable for the following components:   RBC 3.46 (*)    Hemoglobin 10.1 (*)    HCT 36.4 (*)    MCV 105.2 (*)    MCHC 27.7 (*)    All other components within normal limits  BLOOD GAS, VENOUS - Abnormal; Notable for the following components:   pCO2, Ven 103 (*)    Bicarbonate 59.5 (*)    Acid-Base Excess 27.4 (*)  All other components within normal limits  URINALYSIS, ROUTINE W REFLEX MICROSCOPIC - Abnormal; Notable for the following components:   Color, Urine YELLOW (*)    APPearance CLEAR (*)    All other components within normal limits  BLOOD GAS, VENOUS - Abnormal; Notable for the following components:   pCO2, Ven 90 (*)    Bicarbonate 58.4 (*)    Acid-Base Excess 27.6 (*)    All other components within normal limits  RESP PANEL BY RT-PCR (RSV, FLU A&B, COVID)  RVPGX2  BRAIN NATRIURETIC PEPTIDE  TROPONIN I (HIGH SENSITIVITY)     EKG  ED ECG REPORT I, Merlyn Lot, the attending physician, personally viewed and interpreted this ECG.   Date: 05/30/2022  EKG Time: 16:48  Rate: 85  Rhythm: sinus  Axis: normal  Intervals: normal qt  ST&T Change: no stemi, no depressions    RADIOLOGY Please see ED Course for my review and interpretation.  I personally reviewed all radiographic images ordered to evaluate for the  above acute complaints and reviewed radiology reports and findings.  These findings were personally discussed with the patient.  Please see medical record for radiology report.    PROCEDURES:  Critical Care performed: yes  .Critical Care  Performed by: Merlyn Lot, MD Authorized by: Merlyn Lot, MD   Critical care provider statement:    Critical care time (minutes):  35   Critical care was necessary to treat or prevent imminent or life-threatening deterioration of the following conditions:  Respiratory failure   Critical care was time spent personally by me on the following activities:  Ordering and performing treatments and interventions, ordering and review of laboratory studies, ordering and review of radiographic studies, pulse oximetry, re-evaluation of patient's condition, review of old charts, obtaining history from patient or surrogate, examination of patient, evaluation of patient's response to treatment, discussions with primary provider, discussions with consultants and development of treatment plan with patient or surrogate    MEDICATIONS ORDERED IN ED: Medications  ipratropium-albuterol (DUONEB) 0.5-2.5 (3) MG/3ML nebulizer solution 3 mL (has no administration in time range)  ipratropium-albuterol (DUONEB) 0.5-2.5 (3) MG/3ML nebulizer solution 3 mL (3 mLs Nebulization Given 05/30/22 1911)  ipratropium-albuterol (DUONEB) 0.5-2.5 (3) MG/3ML nebulizer solution 3 mL (3 mLs Nebulization Given 05/30/22 1911)  methylPREDNISolone sodium succinate (SOLU-MEDROL) 125 mg/2 mL injection 125 mg (125 mg Intravenous Given 05/30/22 1911)     IMPRESSION / MDM / [place] / ED COURSE  I reviewed the triage vital signs and the nursing notes.                              Differential diagnosis includes, but is not limited to, Asthma, copd, CHF, pna, ptx, malignancy, Pe, anemia  Patient presenting to the ER for evaluation of symptoms as described above.  Based on  symptoms, risk factors and considered above differential, this presenting complaint could reflect a potentially life-threatening illness therefore the patient will be placed on continuous pulse oximetry and telemetry for monitoring.  Laboratory evaluation will be sent to evaluate for the above complaints.  Patient appears drowsy and I have a high suspicion for hypercapnic respiratory failure.  Patient moved to room for BiPAP ordered.  DuoNebs ordered as well as IV Solu-Medrol.  Clinical Course as of 05/30/22 2033  Fri May 30, 2022  1920 Patient's pCO2 is critically elevated to 103.  Currently on BiPAP receiving Solu-Medrol as well as nebs.  Will closely monitor  and reassess.  Chest x-ray on my review and interpretation does not show any evidence of edema or effusion. [PR]  2032 Repeat VBG improving.  Patient reassessed clinically continues to improve.  At this point I do believe he is stable and appropriate for admission the hospital. [PR]    Clinical Course User Index [PR] Merlyn Lot, MD     FINAL CLINICAL IMPRESSION(S) / ED DIAGNOSES   Final diagnoses:  COPD with acute exacerbation ([place])  Acute respiratory failure with hypercapnia ([place])     Rx / DC Orders   ED Discharge Orders     None        Note:  This document was prepared using Dragon voice recognition software and may include unintentional dictation errors.    Merlyn Lot, MD 05/30/22 2033

## 2022-05-30 NOTE — Assessment & Plan Note (Signed)
-  PCO2 103 -Hypersomnolence and confusion at home -Bicarb > 45 with pH 7.37 - compensated -Improvement with BiPAP -Confirmed DNI status -Continue BiPAP, steroids, breathing treatments -Low suspicion for infection - no antibiotics indicated at this time -Continue to monitor

## 2022-05-30 NOTE — Telephone Encounter (Signed)
I spoke with Davy Pique with [facility] and she said that pt, pts daughter and Davy Pique were all in agreement about pt going to ED. Pt having UTI issues, hallucinations, pt could not follow simple instructions today which was totally not like pt per [facility]. Pt does have fluid in lungs also. Pt is on his way to [facility] ED now. Sending note to Dr Damita Dunnings and Damita Dunnings pool.

## 2022-05-31 ENCOUNTER — Encounter: Payer: Self-pay | Admitting: Family Medicine

## 2022-05-31 LAB — CBC WITH DIFFERENTIAL/PLATELET
Abs Immature Granulocytes: 0.03 10*3/uL (ref 0.00–0.07)
Basophils Absolute: 0 10*3/uL (ref 0.0–0.1)
Basophils Relative: 0 %
Eosinophils Absolute: 0 10*3/uL (ref 0.0–0.5)
Eosinophils Relative: 0 %
HCT: 33.1 % — ABNORMAL LOW (ref 39.0–52.0)
Hemoglobin: 9.5 g/dL — ABNORMAL LOW (ref 13.0–17.0)
Immature Granulocytes: 0 %
Lymphocytes Relative: 5 %
Lymphs Abs: 0.3 10*3/uL — ABNORMAL LOW (ref 0.7–4.0)
MCH: 29.1 pg (ref 26.0–34.0)
MCHC: 28.7 g/dL — ABNORMAL LOW (ref 30.0–36.0)
MCV: 101.5 fL — ABNORMAL HIGH (ref 80.0–100.0)
Monocytes Absolute: 0.1 10*3/uL (ref 0.1–1.0)
Monocytes Relative: 1 %
Neutro Abs: 6.9 10*3/uL (ref 1.7–7.7)
Neutrophils Relative %: 94 %
Platelets: 197 10*3/uL (ref 150–400)
RBC: 3.26 MIL/uL — ABNORMAL LOW (ref 4.22–5.81)
RDW: 13.9 % (ref 11.5–15.5)
WBC: 7.4 10*3/uL (ref 4.0–10.5)
nRBC: 0 % (ref 0.0–0.2)

## 2022-05-31 LAB — COMPREHENSIVE METABOLIC PANEL
ALT: 13 U/L (ref 0–44)
AST: 23 U/L (ref 15–41)
Albumin: 3 g/dL — ABNORMAL LOW (ref 3.5–5.0)
Alkaline Phosphatase: 92 U/L (ref 38–126)
Anion gap: 12 (ref 5–15)
BUN: 26 mg/dL — ABNORMAL HIGH (ref 8–23)
CO2: 43 mmol/L — ABNORMAL HIGH (ref 22–32)
Calcium: 9.2 mg/dL (ref 8.9–10.3)
Chloride: 86 mmol/L — ABNORMAL LOW (ref 98–111)
Creatinine, Ser: 0.97 mg/dL (ref 0.61–1.24)
GFR, Estimated: >60 mL/min (ref >60)
Glucose, Bld: 174 mg/dL — ABNORMAL HIGH (ref 70–99)
Potassium: 4.1 mmol/L (ref 3.5–5.1)
Sodium: 141 mmol/L (ref 135–145)
Total Bilirubin: 1 mg/dL (ref 0.3–1.2)
Total Protein: 7.2 g/dL (ref 6.5–8.1)

## 2022-05-31 LAB — BLOOD GAS, ARTERIAL
Acid-Base Excess: 29.5 mmol/L — ABNORMAL HIGH (ref 0.0–2.0)
Bicarbonate: 58 mmol/L — ABNORMAL HIGH (ref 20.0–28.0)
Delivery systems: POSITIVE
Expiratory PAP: 6 cmH2O
FIO2: 35 %
Inspiratory PAP: 16 cmH2O
O2 Saturation: 97.3 %
Patient temperature: 37
RATE: 12 resp/min
pCO2 arterial: 71 mmHg (ref 32–48)
pH, Arterial: 7.52 — ABNORMAL HIGH (ref 7.35–7.45)
pO2, Arterial: 82 mmHg — ABNORMAL LOW (ref 83–108)

## 2022-05-31 LAB — MAGNESIUM: Magnesium: 1.6 mg/dL — ABNORMAL LOW (ref 1.7–2.4)

## 2022-05-31 MED ORDER — MOMETASONE FURO-FORMOTEROL FUM 200-5 MCG/ACT IN AERO: 2.0000 | INHALATION_SPRAY | Freq: Two times a day (BID) | RESPIRATORY_TRACT | Status: DC

## 2022-05-31 MED ORDER — MAGNESIUM SULFATE 4 GM/100ML IV SOLN
4.0000 g | Freq: Once | INTRAVENOUS | Status: AC
  Administered 2022-05-31: 4 g via INTRAVENOUS
  Filled 2022-05-31: qty 100

## 2022-05-31 NOTE — ED Notes (Signed)
Report received from Kelly, RN

## 2022-05-31 NOTE — ED Notes (Signed)
Pt eating breakfast, daughter in the room helping him.

## 2022-05-31 NOTE — Progress Notes (Signed)
PROGRESS NOTE    [name] [name]  FHQ:[id number] DOB: Oct 25, 1933 DOA: 05/30/2022 PCP: Tonia Ghent, MD    Brief Narrative:  [name] [name] is a 88 y.o. male with medical history significant of AAA, anxiety, COPD, former smoker, OSA on CPAP, PVD, CHF, presented to hospital with not feeling well for 5 days.  He did have some nausea vomiting and decreased appetite with fatigue and increased to somnolence.  Patient had been using his rescue inhaler at home more than usual but no fevers.  Patient does have home health with home physical therapy who is wrapping his legs for pressure ulcer.  Due to increasing weakness patient was brought into the hospital.  Assessment and Plan:  Acute on chronic hypercapnic respiratory failure secondary to COPD. Initial pCO2 was 103.  pH was 7.3 with bicarb of more than 45 and was compensated.  Patient improved with BiPAP.  DNI status.  On steroids breathing treatment.  No signs and symptoms of infection.  Patient stated that he does have nasal CPAP at home but was not really using it.  Patient's wife at bedside.  Counseled the need for using it at home during night and during napping.  Patient uses 5 L of oxygen by nasal cannula at baseline.   Acute metabolic encephalopathy Likely secondary to hypercapnia.  CT head without any acute findings.     Chronic heart failure with preserved ejection fraction (HFpEF) ([place]) Chest x-ray without infiltrate or congestion.  Does have some chronic peripheral edema.  COPD with acute exacerbation ([place]) Patient was noted to be wheezing with hypercapnia.  Continue status bronchodilators BiPAP as necessary.  Anxiety state Benzodiazepine has been on hold due to hypercapnia.   History of obstructive sleep apnea.  Supposed to be on nasal CPAP but not consistently using it.  Emphasized about it.  Weakness debility.  Will get the patient ambulating.  Consult PT.   DVT prophylaxis: heparin injection 5,000 Units Start: 05/30/22  2215 SCDs Start: 05/30/22 2211   Code Status:     Code Status: DNR  Disposition: Home likely tomorrow 06/01/22  Status is: Inpatient Remains inpatient appropriate because: Encephalopathy,respiratory failure,   Family Communication: Spoke with the patient's wife at bedside.  Consultants:  None  Procedures:  BiPAP placement  Antimicrobials:  None  Anti-infectives (From admission, onward)    None        Subjective: Today, patient was seen and examined at bedside.  Feels a little better.  Less confused as per the patient's wife at bedside.  Denies any chest pain, shortness of breath, fever or chills.  Was on BiPAP which was taken off and is currently on nasal cannula oxygen.  Patient states that he does have nasal CPAP at home but is not consistently using it.  Has oxygen at home as well.  He has 5 L of oxygen at baseline.  Objective: Vitals:   05/31/22 0530 05/31/22 0600 05/31/22 0630 05/31/22 0945  BP: 137/70 122/64 134/65 (!) 126/55  Pulse: (!) 101 98 96 94  Resp: (!) 34 (!) 27 (!) 34 (!) 24  Temp:      TempSrc:      SpO2: 90% 93% 91% 95%  Weight:      Height:       No intake or output data in the 24 hours ending 05/31/22 1252 Filed Weights   05/30/22 1647  Weight: 100.7 kg    Physical Examination: Body mass index is 31.85 kg/m.  General: Obese built, not  in obvious distress, alert awake and Communicative, on nasal cannula oxygen HENT:   No scleral pallor or icterus noted. Oral mucosa is moist.  Chest:    Diminished breath sounds bilaterally. No crackles or wheezes.  CVS: S1 &S2 heard. No murmur.  Regular rate and rhythm. Abdomen: Soft, nontender, nondistended.  Bowel sounds are heard.   Extremities: No cyanosis, clubbing or edema.  Peripheral pulses are palpable. Psych: Alert, awake and Communicative.  Oriented to place and person. CNS:  No cranial nerve deficits.  Power equal in all extremities.   Skin: Warm and dry.  Bilateral lower extremity  ulceration with dressing.  Data Reviewed:   CBC: Recent Labs  Lab 05/26/22 1033 05/30/22 1654 05/31/22 0333  WBC 7.3 8.8 7.4  NEUTROABS 5.1  --  6.9  HGB 9.9* 10.1* 9.5*  HCT 34.7* 36.4* 33.1*  MCV 101.5* 105.2* 101.5*  PLT 240 222 440    Basic Metabolic Panel: Recent Labs  Lab 05/30/22 1654 05/31/22 0333  NA 140 141  K 3.8 4.1  CL 84* 86*  CO2 >45* 43*  GLUCOSE 95 174*  BUN 25* 26*  CREATININE 0.93 0.97  CALCIUM 9.0 9.2  MG  --  1.6*    Liver Function Tests: Recent Labs  Lab 05/30/22 1654 05/31/22 0333  AST 19 23  ALT 12 13  ALKPHOS 99 92  BILITOT 1.0 1.0  PROT 7.5 7.2  ALBUMIN 3.2* 3.0*     Radiology Studies: CT HEAD WO CONTRAST (5MM)  Result Date: 05/30/2022 CLINICAL DATA:  Altered mental status EXAM: CT HEAD WITHOUT CONTRAST TECHNIQUE: Contiguous axial images were obtained from the base of the skull through the vertex without intravenous contrast. RADIATION DOSE REDUCTION: This exam was performed according to the departmental dose-optimization program which includes automated exposure control, adjustment of the mA and/or kV according to patient size and/or use of iterative reconstruction technique. COMPARISON:  12/02/2016 FINDINGS: Brain: No evidence of acute infarction, hemorrhage, extra-axial collection or mass lesion/mass effect. Global cortical and central atrophy.  Stable ventriculomegaly. Vascular: Intracranial atherosclerosis. Skull: Normal. Negative for fracture or focal lesion. Sinuses/Orbits: The visualized paranasal sinuses are essentially clear. The mastoid air cells are unopacified. Other: None. IMPRESSION: No evidence of acute intracranial abnormality. Atrophy with stable ventriculomegaly. Electronically Signed   By: Julian Hy M.D.   On: 05/30/2022 20:20   DG Chest 2 View  Result Date: 05/30/2022 CLINICAL DATA:  Altered mental status EXAM: CHEST - 2 VIEW COMPARISON:  Chest x-ray 02/17/2018 FINDINGS: Patient is status post TAVR. The  heart size and mediastinal contours are within normal limits. Both lungs are clear. The visualized skeletal structures are unremarkable. IMPRESSION: No active cardiopulmonary disease. Electronically Signed   By: Ronney Asters M.D.   On: 05/30/2022 17:15      LOS: 1 day   Flora Lipps, MD [facility] Available via Epic secure chat 7am-7pm After these hours, please refer to coverage provider listed on [email or web address] 05/31/2022, 12:52 PM

## 2022-05-31 NOTE — Progress Notes (Signed)
       CROSS COVER NOTE  NAME: [name] [name] MRN: [id number] DOB : [birth year]   HPI/Events of Note   ABG reported to me from RT pH 7.52; PaCO2 71; HCO3 58  Assessment and  Interventions   Assessment: Patient normally with compensated respiratory acidosis now overcompensated.  Plan: Trial off [place] NP [facility]

## 2022-05-31 NOTE — ED Notes (Signed)
Waiting on inhaler from pharmacy, requested it to be sent up. Pts resp rate about the same. 02 on 5L WNL

## 2022-05-31 NOTE — Progress Notes (Signed)
Patient trialed off Bipap at this time, placed on 5L [place], tolerating well at this time.

## 2022-05-31 NOTE — ED Notes (Signed)
Pt repositioned until pt reports that he is comfortable.  RN attempted to assist patient with urinal but pt reports he his unable to void at this time and would like to try later.

## 2022-05-31 NOTE — ED Notes (Signed)
Pts linen changed. New purewick applied. Pt says he is breathing okay. 02 at his baseline of 5L.

## 2022-05-31 NOTE — Hospital Course (Signed)
[name] [name] is a 88 y.o. male with medical history significant of AAA, anxiety, COPD, former smoker, OSA on CPAP, PVD, CHF, presented to hospital with not feeling well for 5 days.  He did have some nausea vomiting and decreased appetite with fatigue and increased to somnolence.  Patient had been using his rescue inhaler at home more than usual but no fevers.  Patient does have home health with home physical therapy who is wrapping his legs for pressure ulcer.  Due to increasing weakness patient was brought into the hospital.  Assessment and Plan: Acute hypercapnic respiratory failure secondary to COPD. Initial pCO2 was 103.  pH was 7.3 with bicarb of more than 45 and was compensated.  Patient improved with BiPAP.  DNI status.  On steroids breathing treatment.  No signs and symptoms of infection.   Acute metabolic encephalopathy Likely secondary to hypercapnia.  CT head without any acute findings.     Chronic heart failure with preserved ejection fraction (HFpEF) ([place]) Chest x-ray without infiltrate or congestion.  Does have some chronic peripheral edema.  COPD with acute exacerbation ([place]) Patient was noted to be wheezing with hypercapnia.  Continue status bronchodilators BiPAP as necessary.  Anxiety state Benzodiazepine has been on hold due to hypercapnia.

## 2022-05-31 NOTE — ED Notes (Cosign Needed Addendum)
Pts lined cleaned after he voided in the bed. When turned on his side, writer noted a stage 2 pressure ulcer on his sacral area that was cleaned and dried. Mepalex was placed over it and a pillow was placed under the right side of his bottom. He has a stage 2 pressure ulcer to his right lower leg that writer covered with a non adherent pad and a small stage 2 ulcer on his left lower leg already covered. Per daughter pt has a home health nurse that visits him and usually dresses these.

## 2022-06-01 LAB — BASIC METABOLIC PANEL
Anion gap: 6 (ref 5–15)
BUN: 31 mg/dL — ABNORMAL HIGH (ref 8–23)
CO2: 43 mmol/L — ABNORMAL HIGH (ref 22–32)
Calcium: 8.8 mg/dL — ABNORMAL LOW (ref 8.9–10.3)
Chloride: 87 mmol/L — ABNORMAL LOW (ref 98–111)
Creatinine, Ser: 0.89 mg/dL (ref 0.61–1.24)
GFR, Estimated: >60 mL/min (ref >60)
Glucose, Bld: 137 mg/dL — ABNORMAL HIGH (ref 70–99)
Potassium: 3.9 mmol/L (ref 3.5–5.1)
Sodium: 136 mmol/L (ref 135–145)

## 2022-06-01 LAB — CBC
HCT: 31.9 % — ABNORMAL LOW (ref 39.0–52.0)
Hemoglobin: 9.6 g/dL — ABNORMAL LOW (ref 13.0–17.0)
MCH: 29.1 pg (ref 26.0–34.0)
MCHC: 30.1 g/dL (ref 30.0–36.0)
MCV: 96.7 fL (ref 80.0–100.0)
Platelets: 230 10*3/uL (ref 150–400)
RBC: 3.3 MIL/uL — ABNORMAL LOW (ref 4.22–5.81)
RDW: 14.6 % (ref 11.5–15.5)
WBC: 8.1 10*3/uL (ref 4.0–10.5)
nRBC: 0 % (ref 0.0–0.2)

## 2022-06-01 LAB — MAGNESIUM: Magnesium: 2.3 mg/dL (ref 1.7–2.4)

## 2022-06-01 NOTE — Progress Notes (Addendum)
X2 assist with getting patient dressed. Walker x2 assist. Patient expressed he definitely wants to go home and feels he is able to care for himself with daughter help. MD notified. Daughter picking patient up and aware of patient weakness- she has HHPT and family members to help with patient. No further needs. PIV removed.

## 2022-06-01 NOTE — Consult Note (Signed)
[facility] Nurse Consult Note: Reason for Consult:Stage 2 PI to sacrum, two partial thickness areas of skin loss to bilateral LEs. Wound type: Pressure, venous insufficiency Pressure Injury POA: Yes Measurement:Bedside RN to measure wounds and document on Nursing Flow Sheet Wound MHD:QQIW Drainage (amount, consistency, odor) scant Periwound: intact Dressing procedure/placement/frequency:Patient has HHRN at home and uses Unna's boots to support venous return. While in house, we will elevate the LEs and use topical care to support tissue repair and regeneration. Bilateral heel boots are provided. Turning and repositioning is in place and I have provided guidance to minimize time in the supine position and to place a xeroform gauze topped with a sacral silicone foam dressing per protocol over the Stage 2 partial thickness skin injury that was POA. House incontinence care products will be used daily and PRN soiling.   [facility] nursing team will not follow, but will remain available to this patient, the nursing and medical teams.  Please re-consult if needed.  Thank you for inviting [place] to participate in this patient's Plan of Care.  Maudie Flakes, MSN, RN, CNS, [place], Serita Grammes, [employer], [employer] phone:  [phone]

## 2022-06-01 NOTE — Discharge Summary (Signed)
Physician Discharge Summary  [name] PYP:[id number] DOB: 07-10-1933 DOA: 05/30/2022  PCP: Joaquim Nam, MD  Admit date: 05/30/2022 Discharge date: 06/01/2022  Admitted From: Home  Discharge disposition: Home with home health PT and RN  Recommendations for Outpatient Follow-Up:   Follow up with your primary care provider in one week.  Check CBC, BMP, magnesium in the next visit Patient should be encouraged to use nasal CPAP during napping and at nighttime.   Discharge Diagnosis:   Principal Problem:   Respiratory failure with hypercapnia (HCC) Active Problems:   Anxiety state   Chronic respiratory failure with hypoxia (HCC)   COPD with acute exacerbation (HCC)   Chronic heart failure with preserved ejection fraction (HFpEF) (HCC)   Acute metabolic encephalopathy   Discharge Condition: Improved.  Diet recommendation: Low sodium, heart healthy.    Wound care: Bilateral lower extremity wound care at wound care center  Code status: DNR   History of Present Illness:   [name] [name] is a 88 y.o. male with medical history significant of AAA, anxiety, COPD, former smoker, OSA on CPAP, PVD, CHF, presented to hospital with not feeling well for 5 days.  He did have some nausea vomiting and decreased appetite with fatigue and increased to somnolence.  Patient had been using his rescue inhaler at home more than usual but no fevers.  Patient does have home health with home physical therapy who is wrapping his legs for pressure ulcer.  Due to increasing weakness patient was brought into the hospital.   Hospital Course:   Following conditions were addressed during hospitalization as listed below,  Acute on chronic hypercapnic respiratory failure secondary to COPD. Initial pCO2 was 103.  pH was 7.3 with bicarb of more than 45 and was compensated.  Patient improved with BiPAP.  DNI status. Initially received steroids breathing treatment.  No signs and symptoms of infection.   Patient stated that he does have nasal CPAP at home but was not really using it.  Patient was extensively counseled the need for using it at home during night and during napping.  Patient uses 5 L of oxygen by nasal cannula at baseline.   Acute metabolic encephalopathy Likely secondary to hypercapnia.  CT head without any acute findings.  Patient was emphasized on use of nasal CPAP    Chronic heart failure with preserved ejection fraction (HFpEF) (HCC) Chest x-ray without infiltrate or congestion.  Does have some chronic peripheral edema.   COPD with acute exacerbation (HCC) Improved at this time.  No wheezing.  Continue bronchodilators from home.  No need for steroids   Anxiety state Resume benzodiazepines from home cautious with respiratory issues.   History of obstructive sleep apnea.  Supposed to be on nasal CPAP but not consistently using it.  Emphasized about it.   Weakness debility.  Uses walker at home.  Feels at his baseline.  Will arrange for home health PT RN at home.   Disposition.  At this time, patient is stable for disposition home with outpatient PCP follow-up.  Spoke with the patient's spouse at bedside on 05/31/2022  Medical Consultants:   None.  Procedures:    None Subjective:   Today, patient was seen and examined at bedside.  Alert awake and Communicative.  Wants to go home.  No cough fever wheezing or chills.  Spoke about being compliant with CPAP  Discharge Exam:   Vitals:   06/01/22 0729 06/01/22 0753  BP:    Pulse:  Resp:    Temp:    SpO2: 97% 95%   Vitals:   06/01/22 0442 06/01/22 0448 06/01/22 0729 06/01/22 0753  BP: (!) 155/88     Pulse: 99     Resp: 17     Temp: 97.7 F (36.5 C)     TempSrc:      SpO2: 90% 93% 97% 95%  Weight:      Height:       General: Alert awake, not in obvious distress, obese, elderly male, on nasal cannula oxygen HENT: pupils equally reacting to light,  No scleral pallor or icterus noted. Oral mucosa is  moist.  Chest:   Diminished breath sounds bilaterally. No crackles or wheezes.  CVS: S1 &S2 heard. No murmur.  Regular rate and rhythm. Abdomen: Soft, nontender, nondistended.  Bowel sounds are heard.   Extremities: No cyanosis, clubbing or edema.   Psych: Alert, awake and oriented, normal mood CNS:  No cranial nerve deficits.  Power equal in all extremities.   Skin: Warm and dry.  Bilateral lower extremity ulceration with dressing.  The results of significant diagnostics from this hospitalization (including imaging, microbiology, ancillary and laboratory) are listed below for reference.     Diagnostic Studies:   CT HEAD WO CONTRAST ( )  Result Date: 05/30/2022 CLINICAL DATA:  Altered mental status EXAM: CT HEAD WITHOUT CONTRAST TECHNIQUE: Contiguous axial images were obtained from the base of the skull through the vertex without intravenous contrast. RADIATION DOSE REDUCTION: This exam was performed according to the departmental dose-optimization program which includes automated exposure control, adjustment of the mA and/or kV according to patient size and/or use of iterative reconstruction technique. COMPARISON:  12/02/2016 FINDINGS: Brain: No evidence of acute infarction, hemorrhage, extra-axial collection or mass lesion/mass effect. Global cortical and central atrophy.  Stable ventriculomegaly. Vascular: Intracranial atherosclerosis. Skull: Normal. Negative for fracture or focal lesion. Sinuses/Orbits: The visualized paranasal sinuses are essentially clear. The mastoid air cells are unopacified. Other: None. IMPRESSION: No evidence of acute intracranial abnormality. Atrophy with stable ventriculomegaly. Electronically Signed   By: Charline Bills M.D.   On: 05/30/2022 20:20   DG Chest 2 View  Result Date: 05/30/2022 CLINICAL DATA:  Altered mental status EXAM: CHEST - 2 VIEW COMPARISON:  Chest x-ray 02/17/2018 FINDINGS: Patient is status post TAVR. The heart size and mediastinal contours  are within normal limits. Both lungs are clear. The visualized skeletal structures are unremarkable. IMPRESSION: No active cardiopulmonary disease. Electronically Signed   By: Darliss Cheney M.D.   On: 05/30/2022 17:15     Labs:   Basic Metabolic Panel: Recent Labs  Lab 05/30/22 1654 05/31/22 0333 06/01/22 0550  NA 140 141 136  K 3.8 4.1 3.9  CL 84* 86* 87*  CO2 >45* 43* 43*  GLUCOSE 95 174* 137*  BUN 25* 26* 31*  CREATININE 0.93 0.97 0.89  CALCIUM 9.0 9.2 8.8*  MG  --  1.6* 2.3   GFR Estimated Creatinine Clearance: 71 mL/min (by C-G formula based on SCr of 0.89 mg/dL). Liver Function Tests: Recent Labs  Lab 05/30/22 1654 05/31/22 0333  AST 19 23  ALT 12 13  ALKPHOS 99 92  BILITOT 1.0 1.0  PROT 7.5 7.2  ALBUMIN 3.2* 3.0*   No results for input(s): "LIPASE", "AMYLASE" in the last 168 hours. No results for input(s): "AMMONIA" in the last 168 hours. Coagulation profile No results for input(s): "INR", "PROTIME" in the last 168 hours.  CBC: Recent Labs  Lab 05/26/22 1033 05/30/22 1654 05/31/22  6962 06/01/22 0550  WBC 7.3 8.8 7.4 8.1  NEUTROABS 5.1  --  6.9  --   HGB 9.9* 10.1* 9.5* 9.6*  HCT 34.7* 36.4* 33.1* 31.9*  MCV 101.5* 105.2* 101.5* 96.7  PLT 240 222 197 230   Cardiac Enzymes: No results for input(s): "CKTOTAL", "CKMB", "CKMBINDEX", "TROPONINI" in the last 168 hours. BNP: Invalid input(s): "POCBNP" CBG: No results for input(s): "GLUCAP" in the last 168 hours. D-Dimer No results for input(s): "DDIMER" in the last 72 hours. Hgb A1c No results for input(s): "HGBA1C" in the last 72 hours. Lipid Profile No results for input(s): "CHOL", "HDL", "LDLCALC", "TRIG", "CHOLHDL", "LDLDIRECT" in the last 72 hours. Thyroid function studies No results for input(s): "TSH", "T4TOTAL", "T3FREE", "THYROIDAB" in the last 72 hours.  Invalid input(s): "FREET3" Anemia work up No results for input(s): "VITAMINB12", "FOLATE", "FERRITIN", "TIBC", "IRON", "RETICCTPCT" in  the last 72 hours. Microbiology Recent Results (from the past 240 hour(s))  Resp panel by RT-PCR (RSV, Flu A&B, Covid) Anterior Nasal Swab     Status: None   Collection Time: 05/30/22  6:43 PM   Specimen: Anterior Nasal Swab  Result Value Ref Range Status   SARS Coronavirus 2 by RT PCR NEGATIVE NEGATIVE Final    Comment: (NOTE) SARS-CoV-2 target nucleic acids are NOT DETECTED.  The SARS-CoV-2 RNA is generally detectable in upper respiratory specimens during the acute phase of infection. The lowest concentration of SARS-CoV-2 viral copies this assay can detect is 138 copies/mL. A negative result does not preclude SARS-Cov-2 infection and should not be used as the sole basis for treatment or other patient management decisions. A negative result may occur with  improper specimen collection/handling, submission of specimen other than nasopharyngeal swab, presence of viral mutation(s) within the areas targeted by this assay, and inadequate number of viral copies(<138 copies/mL). A negative result must be combined with clinical observations, patient history, and epidemiological information. The expected result is Negative.  Fact Sheet for Patients:  BloggerCourse.com  Fact Sheet for Healthcare Providers:  SeriousBroker.it  This test is no t yet approved or cleared by the [place] FDA and  has been authorized for detection and/or diagnosis of SARS-CoV-2 by FDA under an Emergency Use Authorization (EUA). This EUA will remain  in effect (meaning this test can be used) for the duration of the COVID-19 declaration under Section 564(b)(1) of the Act, 21 U.S.C.section 360bbb-3(b)(1), unless the authorization is terminated  or revoked sooner.       Influenza A by PCR NEGATIVE NEGATIVE Final   Influenza B by PCR NEGATIVE NEGATIVE Final    Comment: (NOTE) The Xpert Xpress SARS-CoV-2/FLU/RSV plus assay is intended as an aid in the  diagnosis of influenza from Nasopharyngeal swab specimens and should not be used as a sole basis for treatment. Nasal washings and aspirates are unacceptable for Xpert Xpress SARS-CoV-2/FLU/RSV testing.  Fact Sheet for Patients: BloggerCourse.com  Fact Sheet for Healthcare Providers: SeriousBroker.it  This test is not yet approved or cleared by the [place] FDA and has been authorized for detection and/or diagnosis of SARS-CoV-2 by FDA under an Emergency Use Authorization (EUA). This EUA will remain in effect (meaning this test can be used) for the duration of the COVID-19 declaration under Section 564(b)(1) of the Act, 21 U.S.C. section 360bbb-3(b)(1), unless the authorization is terminated or revoked.     Resp Syncytial Virus by PCR NEGATIVE NEGATIVE Final    Comment: (NOTE) Fact Sheet for Patients: BloggerCourse.com  Fact Sheet for Healthcare Providers: SeriousBroker.it  This test is not yet approved or cleared by the [place] and has been authorized for detection and/or diagnosis of SARS-CoV-2 by FDA under an Emergency Use Authorization (EUA). This EUA will remain in effect (meaning this test can be used) for the duration of the COVID-19 declaration under Section 564(b)(1) of the Act, 21 U.S.C. section 360bbb-3(b)(1), unless the authorization is terminated or revoked.  Performed at [facility], [address] [place], [place] [address]      Discharge Instructions:   Discharge Instructions     Diet - low sodium heart healthy   Complete by: As directed    Discharge instructions   Complete by: As directed    Follow-up with your primary care physician in 1 week.  Check blood work at that time.  Please continue to use oxygen and CPAP at nighttime without interruption.  Please use CPAP while taking a nap as well.  Seek medical attention for  worsening symptoms. Continue wound care as you have been doing.   Increase activity slowly   Complete by: As directed       Allergies as of 06/01/2022       Reactions   Iohexol Hives, Swelling   Pt reports swelling, redness, hives, and blisters    Atorvastatin    REACTION: aches   Celecoxib    REACTION: rash   Clinoril [sulindac]    REACTION: rash   Nitrofurantoin    Spiriva Handihaler [tiotropium Bromide Monohydrate] Other (See Comments)   Dry mouth   Sulfa Antibiotics    Intolerant but unrecalled.         Medication List     TAKE these medications    albuterol 108 (90 Base) MCG/ACT inhaler Commonly known as: ProAir HFA INHALE 1-2 PUFFS INTO THE LUNGS EVERY 6 (SIX) HOURS AS NEEDED FOR WHEEZING.   aspirin EC 81 MG tablet Take 1 tablet (81 mg total) by mouth daily.   [employer] Dispense 4 rolls of unna boot wrap to use on legs with coban.   cyanocobalamin 1000 MCG/ML injection Commonly known as: VITAMIN B12 INJECT 1 ML (1,000 MCG TOTAL) INTO THE MUSCLE ONCE A WEEK.   Flutter Devi Use as directed   furosemide 40 MG tablet Commonly known as: LASIX TAKE 1 TABLET BY MOUTH EVERY DAY   Iron (Ferrous Sulfate) 325 (65 Fe) MG Tabs Take 325 mg by mouth daily.   LORazepam 1 MG tablet Commonly known as: ATIVAN TAKE 1/4 TO 1/2 TABLET BY MOUTH EVERY 6 HOURS AS NEEDED   OXYGEN Inhale into the lungs. 5 Liters   Symbicort 160-4.5 MCG/ACT inhaler Generic drug: budesonide-formoterol TAKE 2 PUFFS BY MOUTH TWICE A DAY   SYRINGE 3CC/25GX1" 25G X 1" 3 ML Misc 1 mL by Does not apply route once a week.        Follow-up Information     Joaquim Nam, MD Follow up in 1 week(s).   Specialty: Family Medicine Contact information: [address] [place] [address] [phone]                  Time coordinating discharge: 39 minutes  Signed:  Angeliki Mates  [facility] 06/01/2022, 8:49 AM

## 2022-06-01 NOTE — Telephone Encounter (Signed)
Noted. Thanks.

## 2022-06-01 NOTE — TOC Transition Note (Signed)
Transition of Care [facility]) - CM/SW Discharge Note   Patient Details  Name: MARQUIZE SEIB MRN: [id number] Date of Birth: 24-Apr-1934  Transition of Care [facility]) CM/SW Contact:  Izola Price, RN Phone Number: 06/01/2022, 10:48 AM   Clinical Narrative:  12/17: Patient left. Secure chat with provider to obtain discharge/resumption orders for [facility] RN and PT as patient is open with [facility] per Meg. W. Informed Med that attempting to obtain resumption orders. Simmie Davies RN CM  4818: Provider wrote resumption of [place] services and [facility] aware. Simmie Davies RN CM   Final next level of care: Home w Home Health Services Barriers to Discharge: Barriers Resolved   Patient Goals and CMS Choice        Discharge Placement                       Discharge Plan and Services                DME Arranged: N/A DME Agency: NA       HH Arranged:  (Open with [facility] for PT/RN. Orders requested via [place].) [place]: [place] Date [place]: 06/01/22 Time [place]: 5909 Representative spoke with at [place] (Notified Med W. that new orders may be needed if RN CM cannot obtain them from discharge provider.)  Social Determinants of Health (SDOH) Interventions     Readmission Risk Interventions     No data to display

## 2022-06-01 NOTE — TOC Progression Note (Signed)
Transition of Care [facility]) - Progression Note    Patient Details  Name: [name] [name] MRN: [id number] Date of Birth: 04/20/1934  Transition of Care [facility]) CM/SW Contact  Izola Price, RN Phone Number: 06/01/2022, 10:38 AM  Clinical Narrative: 12/17: Patient discharging to home   But left before RN CM could ascertain [facility] renewal needs as he is open with [facility] for PT and RN. Message provider via secure chat to request new [place] orders. Patient had oxygen set up at home for 5L/[place] and per Unit RN had transportation and transport oxygen. Notified [facility] of discharge and need for orders. Simmie Davies RN CM         Expected Discharge Plan and Services           Expected Discharge Date: 06/01/22                                     Social Determinants of Health (SDOH) Interventions    Readmission Risk Interventions     No data to display

## 2022-06-02 DIAGNOSIS — G4733 Obstructive sleep apnea (adult) (pediatric): Secondary | ICD-10-CM | POA: Diagnosis not present

## 2022-06-02 DIAGNOSIS — Z515 Encounter for palliative care: Secondary | ICD-10-CM | POA: Diagnosis not present

## 2022-06-02 DIAGNOSIS — J9612 Chronic respiratory failure with hypercapnia: Secondary | ICD-10-CM | POA: Diagnosis not present

## 2022-06-02 DIAGNOSIS — Z6831 Body mass index (BMI) 31.0-31.9, adult: Secondary | ICD-10-CM | POA: Diagnosis not present

## 2022-06-03 ENCOUNTER — Telehealth: Payer: Self-pay | Admitting: *Deleted

## 2022-06-03 ENCOUNTER — Telehealth: Payer: Self-pay | Admitting: Family Medicine

## 2022-06-03 NOTE — Progress Notes (Signed)
  Care Coordination  Note  06/03/2022 Name: [name] [name] MRN: [id number] DOB: 1934/03/10  [name] [name] is a 88 y.o. year old primary care patient of Tonia Ghent, MD. I reached out to Sue Lush by phone today to assist with scheduling a follow up appointment. [name] [name] verbally consented to my assistance.       Follow up plan: Hospital Follow Up appointment scheduled with (Dr Damita Dunnings) on (06/13/2022) at (230pm).  Julian Hy, [place] Direct Dial: [phone]

## 2022-06-03 NOTE — Telephone Encounter (Signed)
Home Health verbal orders [place]  Agency Name: [facility] number: [phone]  Requesting OT/PT/Skilled nursing/Social Work/Speech:  Reason:Skilled nursing // wound care  Frequency:  Please forward to [facility] pool or providers CMA

## 2022-06-03 NOTE — Patient Outreach (Signed)
  Care Coordination [facility] Note Transition Care Management Follow-up Telephone Call Date of discharge and from where[phone] How have you been since you were released from the hospital? Still weak and a little confused at times Any questions or concerns? No  Items Reviewed: Did the pt receive and understand the discharge instructions provided? Yes  Medications obtained and verified? Yes  no new changes in his medications.  Other? Yes  Reiterated that patient is to wear CPAP while taking naps Any new allergies since your discharge? No  Dietary orders reviewed? No Do you have support at home? Yes   Home Care and Equipment/Supplies: Were home health services ordered? yes If so, what is the name of the agency? [facility]  Has the agency set up a time to come to the patient's home? yes Were any new equipment or medical supplies ordered?  No What is the name of the medical supply agency? N/a Were you able to get the supplies/equipment? no Do you have any questions related to the use of the equipment or supplies? No  Functional Questionnaire: (I = Independent and D = Dependent) ADLs: D  Bathing/Dressing- D  Meal Prep- D  Eating- I  Maintaining continence- I  Transferring/Ambulation- D  Managing Meds- D  Follow up appointments reviewed:  PCP Hospital f/u appt confirmed? No   Specialist Hospital f/u appt confirmed? No   Are transportation arrangements needed? No  If their condition worsens, is the pt aware to call PCP or go to the Emergency Dept.? Yes Was the patient provided with contact information for the PCP's office or ED? Yes Was to pt encouraged to call back with questions or concerns? Yes  SDOH assessments and interventions completed:   Yes SDOH Interventions Today    Flowsheet Row Most Recent Value  SDOH Interventions   Food Insecurity Interventions Intervention Not Indicated  Housing Interventions Intervention Not Indicated  Transportation Interventions  Intervention Not Indicated       Care Coordination Interventions:  PCP follow up appointment requested Patient is being followed by Quinn Plowman for Care Coordination    Encounter Outcome:  Pt. Visit Completed    [place] Management [phone]

## 2022-06-03 NOTE — Telephone Encounter (Signed)
Please give the order.  Thanks.   

## 2022-06-04 NOTE — Telephone Encounter (Signed)
Sonja given verbal orders

## 2022-06-07 ENCOUNTER — Other Ambulatory Visit: Payer: Self-pay | Admitting: Internal Medicine

## 2022-06-08 ENCOUNTER — Encounter: Payer: PPO | Admitting: Nurse Practitioner

## 2022-06-08 DIAGNOSIS — R399 Unspecified symptoms and signs involving the genitourinary system: Secondary | ICD-10-CM

## 2022-06-08 NOTE — Progress Notes (Signed)
E-Visit for Urinary Problems ? ?Based on what you shared with me, I feel your condition warrants further evaluation and I recommend that you be seen for a face to face office visit.  Male bladder infections are not very common.  We worry about prostate or kidney conditions.  The standard of care is to examine the abdomen and kidneys, and to do a urine and blood test to make sure that something more serious is not going on.  We recommend that you see a provider today.  If your doctor's office is closed [place] has the following Urgent Cares: ? ?  ?NOTE: You will not be charged for this e-visit. ? ?If you are having a true medical emergency please call 911.   ? ?  ? For an urgent face to face visit, [place] has six urgent care centers for your convenience:  ?  ? [facility] [place] ?Get Driving Directions ?[phone] ?[address] ?[place], [place] [address] ?  ? [facility] ([place]) ?Get Driving Directions ?[phone] ?[address] ?[place], [place] [address] ? ?[facility] ([place] [facility] [place]) ?Get Driving Directions ?[phone] ?[address] ?[place],  [place]  [address] ? ?[facility] ?Get Driving Directions ?[phone] ?[address] ?[place], [place] [address] ?  ?[facility] [place] ?Get Driving Directions  ?[phone] ?[address] ?[address] ?[place], [place] [address] ?  ?[facility] [place] ?Get Driving Directions ?[phone] ?[address] ?,  [address] ? ?Your MyChart E-visit questionnaire answers were reviewed by a board certified advanced clinical practitioner to complete your personal care plan based on your specific symptoms.  Thank you for using e-Visits. ?

## 2022-06-08 NOTE — Progress Notes (Signed)
I have spent 5 minutes in review of e-visit questionnaire, review and updating patient chart, medical decision making and response to patient.  ° °Pericles Carmicheal W Anthonie Lotito, NP ° °  °

## 2022-06-10 ENCOUNTER — Emergency Department: Payer: PPO

## 2022-06-10 ENCOUNTER — Other Ambulatory Visit: Payer: Self-pay

## 2022-06-10 ENCOUNTER — Telehealth: Payer: Self-pay

## 2022-06-10 ENCOUNTER — Encounter: Payer: Self-pay | Admitting: Internal Medicine

## 2022-06-10 ENCOUNTER — Inpatient Hospital Stay
Admission: EM | Admit: 2022-06-10 | Discharge: 2022-06-13 | DRG: 872 | Disposition: A | Discharge status: Home Health | Payer: PPO | Attending: Internal Medicine | Admitting: Internal Medicine

## 2022-06-10 DIAGNOSIS — G4733 Obstructive sleep apnea (adult) (pediatric): Secondary | ICD-10-CM | POA: Diagnosis present

## 2022-06-10 DIAGNOSIS — R509 Fever, unspecified: Secondary | ICD-10-CM | POA: Diagnosis not present

## 2022-06-10 DIAGNOSIS — R000 Tachycardia, unspecified: Secondary | ICD-10-CM | POA: Diagnosis not present

## 2022-06-10 DIAGNOSIS — E876 Hypokalemia: Secondary | ICD-10-CM | POA: Diagnosis not present

## 2022-06-10 DIAGNOSIS — J449 Chronic obstructive pulmonary disease, unspecified: Secondary | ICD-10-CM | POA: Diagnosis not present

## 2022-06-10 DIAGNOSIS — F419 Anxiety disorder, unspecified: Secondary | ICD-10-CM | POA: Diagnosis present

## 2022-06-10 DIAGNOSIS — Z79899 Other long term (current) drug therapy: Secondary | ICD-10-CM | POA: Diagnosis not present

## 2022-06-10 DIAGNOSIS — A4151 Sepsis due to Escherichia coli [E. coli]: Secondary | ICD-10-CM | POA: Diagnosis not present

## 2022-06-10 DIAGNOSIS — Z9841 Cataract extraction status, right eye: Secondary | ICD-10-CM

## 2022-06-10 DIAGNOSIS — W19XXXA Unspecified fall, initial encounter: Secondary | ICD-10-CM | POA: Diagnosis not present

## 2022-06-10 DIAGNOSIS — Z87442 Personal history of urinary calculi: Secondary | ICD-10-CM

## 2022-06-10 DIAGNOSIS — J984 Other disorders of lung: Secondary | ICD-10-CM | POA: Diagnosis not present

## 2022-06-10 DIAGNOSIS — N390 Urinary tract infection, site not specified: Secondary | ICD-10-CM | POA: Diagnosis not present

## 2022-06-10 DIAGNOSIS — N3000 Acute cystitis without hematuria: Secondary | ICD-10-CM | POA: Diagnosis present

## 2022-06-10 DIAGNOSIS — Z8711 Personal history of peptic ulcer disease: Secondary | ICD-10-CM

## 2022-06-10 DIAGNOSIS — R54 Age-related physical debility: Secondary | ICD-10-CM | POA: Diagnosis not present

## 2022-06-10 DIAGNOSIS — Z882 Allergy status to sulfonamides status: Secondary | ICD-10-CM

## 2022-06-10 DIAGNOSIS — N179 Acute kidney failure, unspecified: Secondary | ICD-10-CM | POA: Diagnosis not present

## 2022-06-10 DIAGNOSIS — A419 Sepsis, unspecified organism: Secondary | ICD-10-CM | POA: Diagnosis not present

## 2022-06-10 DIAGNOSIS — I7143 Infrarenal abdominal aortic aneurysm, without rupture: Secondary | ICD-10-CM | POA: Diagnosis present

## 2022-06-10 DIAGNOSIS — N281 Cyst of kidney, acquired: Secondary | ICD-10-CM | POA: Diagnosis not present

## 2022-06-10 DIAGNOSIS — R531 Weakness: Secondary | ICD-10-CM | POA: Diagnosis not present

## 2022-06-10 DIAGNOSIS — D649 Anemia, unspecified: Secondary | ICD-10-CM | POA: Diagnosis present

## 2022-06-10 DIAGNOSIS — J9611 Chronic respiratory failure with hypoxia: Secondary | ICD-10-CM | POA: Diagnosis not present

## 2022-06-10 DIAGNOSIS — Z8249 Family history of ischemic heart disease and other diseases of the circulatory system: Secondary | ICD-10-CM | POA: Diagnosis not present

## 2022-06-10 DIAGNOSIS — R918 Other nonspecific abnormal finding of lung field: Secondary | ICD-10-CM | POA: Diagnosis not present

## 2022-06-10 DIAGNOSIS — M1711 Unilateral primary osteoarthritis, right knee: Secondary | ICD-10-CM | POA: Diagnosis present

## 2022-06-10 DIAGNOSIS — I700 Atherosclerosis of aorta: Secondary | ICD-10-CM | POA: Diagnosis not present

## 2022-06-10 DIAGNOSIS — Z961 Presence of intraocular lens: Secondary | ICD-10-CM | POA: Diagnosis not present

## 2022-06-10 DIAGNOSIS — R0689 Other abnormalities of breathing: Secondary | ICD-10-CM | POA: Diagnosis not present

## 2022-06-10 DIAGNOSIS — Z9981 Dependence on supplemental oxygen: Secondary | ICD-10-CM

## 2022-06-10 DIAGNOSIS — Z9181 History of falling: Secondary | ICD-10-CM

## 2022-06-10 DIAGNOSIS — R6520 Severe sepsis without septic shock: Secondary | ICD-10-CM | POA: Diagnosis present

## 2022-06-10 DIAGNOSIS — I5032 Chronic diastolic (congestive) heart failure: Secondary | ICD-10-CM | POA: Diagnosis present

## 2022-06-10 DIAGNOSIS — Z7951 Long term (current) use of inhaled steroids: Secondary | ICD-10-CM

## 2022-06-10 DIAGNOSIS — Z9842 Cataract extraction status, left eye: Secondary | ICD-10-CM | POA: Diagnosis not present

## 2022-06-10 DIAGNOSIS — Z888 Allergy status to other drugs, medicaments and biological substances status: Secondary | ICD-10-CM

## 2022-06-10 DIAGNOSIS — I872 Venous insufficiency (chronic) (peripheral): Secondary | ICD-10-CM | POA: Diagnosis not present

## 2022-06-10 DIAGNOSIS — Z953 Presence of xenogenic heart valve: Secondary | ICD-10-CM

## 2022-06-10 DIAGNOSIS — Z7982 Long term (current) use of aspirin: Secondary | ICD-10-CM

## 2022-06-10 DIAGNOSIS — Z7401 Bed confinement status: Secondary | ICD-10-CM | POA: Diagnosis not present

## 2022-06-10 DIAGNOSIS — N200 Calculus of kidney: Secondary | ICD-10-CM | POA: Diagnosis not present

## 2022-06-10 DIAGNOSIS — Z952 Presence of prosthetic heart valve: Secondary | ICD-10-CM

## 2022-06-10 DIAGNOSIS — Z1152 Encounter for screening for COVID-19: Secondary | ICD-10-CM | POA: Diagnosis not present

## 2022-06-10 DIAGNOSIS — K5730 Diverticulosis of large intestine without perforation or abscess without bleeding: Secondary | ICD-10-CM | POA: Diagnosis not present

## 2022-06-10 DIAGNOSIS — I959 Hypotension, unspecified: Secondary | ICD-10-CM | POA: Diagnosis not present

## 2022-06-10 DIAGNOSIS — S0990XA Unspecified injury of head, initial encounter: Secondary | ICD-10-CM | POA: Diagnosis not present

## 2022-06-10 LAB — URINALYSIS, COMPLETE (UACMP) WITH MICROSCOPIC
Bilirubin Urine: NEGATIVE
Glucose, UA: NEGATIVE mg/dL
Ketones, ur: NEGATIVE mg/dL
Nitrite: NEGATIVE
Protein, ur: 30 mg/dL — AB
Specific Gravity, Urine: 1.009 (ref 1.005–1.030)
WBC, UA: >50 WBC/hpf — ABNORMAL HIGH (ref 0–5)
pH: 5 (ref 5.0–8.0)

## 2022-06-10 LAB — CBC WITH DIFFERENTIAL/PLATELET
Abs Immature Granulocytes: 0.33 10*3/uL — ABNORMAL HIGH (ref 0.00–0.07)
Basophils Absolute: 0.1 10*3/uL (ref 0.0–0.1)
Basophils Relative: 0 %
Eosinophils Absolute: 0 10*3/uL (ref 0.0–0.5)
Eosinophils Relative: 0 %
HCT: 29.4 % — ABNORMAL LOW (ref 39.0–52.0)
Hemoglobin: 8.7 g/dL — ABNORMAL LOW (ref 13.0–17.0)
Immature Granulocytes: 1 %
Lymphocytes Relative: 1 %
Lymphs Abs: 0.2 10*3/uL — ABNORMAL LOW (ref 0.7–4.0)
MCH: 29.3 pg (ref 26.0–34.0)
MCHC: 29.6 g/dL — ABNORMAL LOW (ref 30.0–36.0)
MCV: 99 fL (ref 80.0–100.0)
Monocytes Absolute: 0.9 10*3/uL (ref 0.1–1.0)
Monocytes Relative: 3 %
Neutro Abs: 26.7 10*3/uL — ABNORMAL HIGH (ref 1.7–7.7)
Neutrophils Relative %: 95 %
Platelets: 298 10*3/uL (ref 150–400)
RBC: 2.97 MIL/uL — ABNORMAL LOW (ref 4.22–5.81)
RDW: 14.7 % (ref 11.5–15.5)
Smear Review: NORMAL
WBC: 28.2 10*3/uL — ABNORMAL HIGH (ref 4.0–10.5)
nRBC: 0 % (ref 0.0–0.2)

## 2022-06-10 LAB — COMPREHENSIVE METABOLIC PANEL
ALT: 63 U/L — ABNORMAL HIGH (ref 0–44)
AST: 150 U/L — ABNORMAL HIGH (ref 15–41)
Albumin: 2.2 g/dL — ABNORMAL LOW (ref 3.5–5.0)
Alkaline Phosphatase: 219 U/L — ABNORMAL HIGH (ref 38–126)
Anion gap: 9 (ref 5–15)
BUN: 45 mg/dL — ABNORMAL HIGH (ref 8–23)
CO2: 34 mmol/L — ABNORMAL HIGH (ref 22–32)
Calcium: 7.9 mg/dL — ABNORMAL LOW (ref 8.9–10.3)
Chloride: 96 mmol/L — ABNORMAL LOW (ref 98–111)
Creatinine, Ser: 1.91 mg/dL — ABNORMAL HIGH (ref 0.61–1.24)
GFR, Estimated: 34 mL/min — ABNORMAL LOW (ref >60)
Glucose, Bld: 115 mg/dL — ABNORMAL HIGH (ref 70–99)
Potassium: 2.9 mmol/L — ABNORMAL LOW (ref 3.5–5.1)
Sodium: 139 mmol/L (ref 135–145)
Total Bilirubin: 1.6 mg/dL — ABNORMAL HIGH (ref 0.3–1.2)
Total Protein: 5.8 g/dL — ABNORMAL LOW (ref 6.5–8.1)

## 2022-06-10 LAB — PROTIME-INR
INR: 1.3 — ABNORMAL HIGH (ref 0.8–1.2)
Prothrombin Time: 16.2 seconds — ABNORMAL HIGH (ref 11.4–15.2)

## 2022-06-10 LAB — APTT: aPTT: 34 seconds (ref 24–36)

## 2022-06-10 LAB — TROPONIN I (HIGH SENSITIVITY): Troponin I (High Sensitivity): 32 ng/L — ABNORMAL HIGH (ref <18)

## 2022-06-10 LAB — BRAIN NATRIURETIC PEPTIDE: B Natriuretic Peptide: 69.6 pg/mL (ref 0.0–100.0)

## 2022-06-10 MED ORDER — MOMETASONE FURO-FORMOTEROL FUM 200-5 MCG/ACT IN AERO
2.0000 | INHALATION_SPRAY | Freq: Two times a day (BID) | RESPIRATORY_TRACT | Status: DC
  Administered 2022-06-11 – 2022-06-13 (×5): 2 via RESPIRATORY_TRACT
  Filled 2022-06-10: qty 8.8

## 2022-06-10 MED ORDER — VANCOMYCIN HCL IN DEXTROSE 1-5 GM/200ML-% IV SOLN
1000.0000 mg | Freq: Once | INTRAVENOUS | Status: AC
  Administered 2022-06-10: 1000 mg via INTRAVENOUS
  Filled 2022-06-10: qty 200

## 2022-06-10 MED ORDER — SODIUM CHLORIDE 0.9 % IV SOLN
2.0000 g | INTRAVENOUS | Status: DC
  Administered 2022-06-11 – 2022-06-13 (×3): 2 g via INTRAVENOUS
  Filled 2022-06-10 (×3): qty 20

## 2022-06-10 MED ORDER — LACTATED RINGERS IV BOLUS (SEPSIS)
1000.0000 mL | Freq: Once | INTRAVENOUS | Status: AC
  Administered 2022-06-10: 1000 mL via INTRAVENOUS

## 2022-06-10 MED ORDER — SENNOSIDES-DOCUSATE SODIUM 8.6-50 MG PO TABS: 1.0000 | ORAL_TABLET | Freq: Every evening | ORAL | Status: DC | PRN

## 2022-06-10 MED ORDER — SODIUM CHLORIDE 0.9 % IV BOLUS
500.0000 mL | Freq: Once | INTRAVENOUS | Status: AC
  Administered 2022-06-10: 500 mL via INTRAVENOUS

## 2022-06-10 MED ORDER — ONDANSETRON HCL 4 MG/2ML IJ SOLN: 4.0000 mg | Freq: Four times a day (QID) | INTRAMUSCULAR | Status: DC | PRN

## 2022-06-10 MED ORDER — ONDANSETRON HCL 4 MG PO TABS: 4.0000 mg | ORAL_TABLET | Freq: Four times a day (QID) | ORAL | Status: DC | PRN

## 2022-06-10 MED ORDER — ACETAMINOPHEN 650 MG RE SUPP: 650.0000 mg | Freq: Four times a day (QID) | RECTAL | Status: DC | PRN

## 2022-06-10 MED ORDER — ALBUTEROL SULFATE HFA 108 (90 BASE) MCG/ACT IN AERS: 2.0000 | INHALATION_SPRAY | Freq: Four times a day (QID) | RESPIRATORY_TRACT | Status: DC | PRN

## 2022-06-10 MED ORDER — SODIUM CHLORIDE 0.9 % IV BOLUS
1000.0000 mL | Freq: Once | INTRAVENOUS | Status: AC
  Administered 2022-06-10: 1000 mL via INTRAVENOUS

## 2022-06-10 MED ORDER — ASPIRIN 81 MG PO TBEC
81.0000 mg | DELAYED_RELEASE_TABLET | Freq: Every day | ORAL | Status: DC
  Administered 2022-06-11 – 2022-06-13 (×3): 81 mg via ORAL
  Filled 2022-06-10 (×3): qty 1

## 2022-06-10 MED ORDER — SODIUM CHLORIDE 0.9 % IV BOLUS: 500.0000 mL | Freq: Once | INTRAVENOUS | Status: DC

## 2022-06-10 MED ORDER — SODIUM CHLORIDE 0.9 % IV SOLN
2.0000 g | Freq: Once | INTRAVENOUS | Status: AC
  Administered 2022-06-10: 2 g via INTRAVENOUS
  Filled 2022-06-10: qty 12.5

## 2022-06-10 MED ORDER — METRONIDAZOLE 500 MG/100ML IV SOLN
500.0000 mg | Freq: Once | INTRAVENOUS | Status: AC
  Administered 2022-06-10: 500 mg via INTRAVENOUS
  Filled 2022-06-10: qty 100

## 2022-06-10 MED ORDER — FERROUS SULFATE 325 (65 FE) MG PO TABS
325.0000 mg | ORAL_TABLET | Freq: Every day | ORAL | Status: DC
  Administered 2022-06-11 – 2022-06-13 (×3): 325 mg via ORAL
  Filled 2022-06-10 (×3): qty 1

## 2022-06-10 MED ORDER — ACETAMINOPHEN 325 MG PO TABS
650.0000 mg | ORAL_TABLET | Freq: Four times a day (QID) | ORAL | Status: DC | PRN
  Administered 2022-06-12: 650 mg via ORAL
  Filled 2022-06-10: qty 2

## 2022-06-10 NOTE — ED Notes (Signed)
Pt given a cup for his dentures

## 2022-06-10 NOTE — Consult Note (Signed)
PHARMACY -  BRIEF ANTIBIOTIC NOTE   Pharmacy has received consult(s) for cefepime and vancomycin from an ED provider. Patient is also ordered metronidazole.  The patient's profile has been reviewed for ht/wt/allergies/indication/available labs.    One time order(s) placed for  --Cefepime 2 g IV --Vancomycin 1 g IV  Further antibiotics/pharmacy consults should be ordered by admitting physician if indicated.                       Thank you, Benita Gutter 06/10/2022  7:02 PM

## 2022-06-10 NOTE — Telephone Encounter (Signed)
Agree with recommendations.  

## 2022-06-10 NOTE — Sepsis Progress Note (Signed)
Following per sepsis protocol   

## 2022-06-10 NOTE — ED Triage Notes (Signed)
Pt brought in by EMS for weakness and a fever, was informed he has a UTI by his home health nurse.

## 2022-06-10 NOTE — Hospital Course (Signed)
[name] [name] is a 88 year old male with history of anxiety, history of COPD, heart failure with preserved ejection fraction presents emergency department for chief concerns of fever.  Initial vitals show temperature of 98.8, respiration rate of 28, heart rate of 110, blood pressure 103/57, SpO2 of 97% on 5 L nasal cannula.  Serum sodium is 139, potassium 2.9, chloride 96, bicarb 34, BUN of 45, serum creatinine of 1.91, EGFR 34, nonfasting blood glucose 115.  WBC 28.2, hemoglobin 8.7, platelets of 298.  ED treatment: Cefepime, Flagyl, sodium chloride 1.5 L bolus, vancomycin.

## 2022-06-10 NOTE — H&P (Addendum)
History and Physical   DIANGELO RADEL ZOX:[id number] DOB: 10-Jan-1934 DOA: 06/10/2022  PCP: Tonia Ghent, MD  Patient coming from: home via EMS  I have personally briefly reviewed patient's old medical records in [place].  Chief Concern: Weakness  HPI: [name] is a 88 year old male with history of anxiety, history of COPD, heart failure with preserved ejection fraction presents emergency department for chief concerns of fever.  Initial vitals show temperature of 98.8, respiration rate of 28, heart rate of 110, blood pressure 103/57, SpO2 of 97% on 5 L nasal cannula.  Serum sodium is 139, potassium 2.9, chloride 96, bicarb 34, BUN of 45, serum creatinine of 1.91, EGFR 34, nonfasting blood glucose 115.  WBC 28.2, hemoglobin 8.7, platelets of 298.  ED treatment: Cefepime, Flagyl, sodium chloride 1.5 L bolus, vancomycin. --------------------------- At bedside, patient is able to tell me his name, age, current year and current location.  Hilda Blades (daughter) and spouse were at bedside.   He developed weakness, that started about 36 hours ago. He was out of it about 36 hrs.  On Christmas morning he was able to walk a short distance which is an improvement from his hosptialization over a week ago. He then spiked a fever, 102-103. He got two doses of Ibuprofen. He developed chills today, and he got two tylenol.   He denies cough, nausea. He endorses dysuria.   Social history: He lives at home with his wife. He denies current tobacco use. He is a former tobacco user, quiting in 1999/2000 and at his peak, he smoked 1.5 ppd. He denies etoh and recreational drug use.   ROS: Constitutional: no weight change, no fever ENT/Mouth: no sore throat, no rhinorrhea Eyes: no eye pain, no vision changes Cardiovascular: no chest pain, no dyspnea,  no edema, no palpitations Respiratory: no cough, no sputum, no wheezing Gastrointestinal: no nausea, no vomiting, no diarrhea, no  constipation Genitourinary: no urinary incontinence, no dysuria, no hematuria Musculoskeletal: no arthralgias, no myalgias Skin: no skin lesions, no pruritus, Neuro: + weakness, no loss of consciousness, no syncope Psych: no anxiety, no depression, + decrease appetite Heme/Lymph: no bruising, no bleeding  ED Course: Discussed with emergency medicine provider, patient requiring hospitalization for chief concerns of possible sepsis.  Assessment/Plan  Principal Problem:   Severe sepsis with acute organ dysfunction (HCC) Active Problems:   Risk for falls   S/P TAVR (transcatheter aortic valve replacement)   Assessment and Plan:  * Severe sepsis with acute organ dysfunction ([place]) - Suspect secondary to UTI - Ceftriaxone 2 g IV daily - Vancomycin per pharmacy - Continue follow-up with lactic acid, procalcitonin, blood cultures x 2 - Admit to progressive cardiac, inpatient  # COPD-resumed home long-acting inhaler and rescue inhaler, patient does not appear to be in acute exacerbation at this time - Continue 5 L nasal cannula baseline  # Infrarenal abdominal aortic aneurysm-approximately 5.6 cm - Updated spouse and daughter at bedside - Did not consult vascular at this time given that patient has active infection and at baseline he requires 5 L nasal cannula - AM team to consult as appropriate  # Right upper lobe lobulated mass approximately 3.9 cm - Updated spouse and daughter at bedside that that is suspicious for neoplasm, patient will need follow-up with PCP for repeat CT imaging with referral for possible PET scan  # Acute on chronic anemia- -Baseline hemoglobin is 9.2-10.1 -Repeat CBC in the a.m.  DVT prophylaxis-pharmacologic DVT prophylaxis not ordered on admission due  to acute on chronic anemia - AM team to initiate when appropriate  Chart reviewed.   DVT prophylaxis: TED hose, Code Status: Patient wishes to have cpr, shock, and medications and adamently refuses  intubation/ventilator Diet: Heart healthy Family Communication: spouse, daughter at bedside Disposition Plan: Pending clinical course, guarded prognosis Consults called: None at this time Admission status: Progressive cardiac, inpatient  Past Medical History:  Diagnosis Date   AAA (abdominal aortic aneurysm) ([place])    a. 3.8 cm CTA 05/2015 b. 3.9 cm by [place] in 11/2017   Anxiety    COPD (chronic obstructive pulmonary disease) ([place])    on home O2 4LPM   Diverticulosis    Former smoker    quit 2001   Hemorrhoids    Kidney stones    passed spontaneously   OSA on CPAP    last study- in the home possibility- 2015, pt. unsure    Prediabetes    Hgb A1C 5.8 in Jan 2017   PUD (peptic ulcer disease)    Right knee DJD 10/31/2011   Severe   S/P TAVR (transcatheter aortic valve replacement) 06/26/2015   26 mm Edwards Sapien 3 transcatheter heart valve placed via percutaneous right transfemoral approach   Severe aortic stenosis    a. Echo 11/16:  Mod LVH, EF 55-60%, no RWMA, Gr 1 DD, severe AS (mean 51 mmHg; peak 86 mmHg), MAC, mild LAE  b. s/p TAVR on 06/26/2015 with 74m Edward Sapien 3 THV   Shortness of breath dyspnea    Past Surgical History:  Procedure Laterality Date   CARDIAC CATHETERIZATION N/A 05/15/2015   Procedure: Right/Left Heart Cath and Coronary Angiography;  Surgeon: MSherren Mocha MD;  [facility];  Service: Cardiovascular;  Laterality: N/A;   CHOLECYSTECTOMY     EYE SURGERY     bilateral cataracts removed, /w IOL   KNEE SURGERY Right 1985   arthrosopic -    TEE WITHOUT CARDIOVERSION N/A 06/26/2015   Procedure: TRANSESOPHAGEAL ECHOCARDIOGRAM (TEE);  Surgeon: MSherren Mocha MD;  [facility]  Service: Open Heart Surgery;  Laterality: N/A;   TONSILLECTOMY     TRANSCATHETER AORTIC VALVE REPLACEMENT, TRANSFEMORAL N/A 06/26/2015   Procedure: TRANSCATHETER AORTIC VALVE REPLACEMENT, TRANSFEMORAL;  Surgeon: MSherren Mocha MD;  [facility]  Service: Open  Heart Surgery;  Laterality: N/A;   Social History:  reports that he quit smoking about 22 years ago. His smoking use included cigarettes. He has a 79.50 pack-year smoking history. He has quit using smokeless tobacco.  His smokeless tobacco use included snuff and chew. He reports that he does not drink alcohol and does not use drugs.  Allergies  Allergen Reactions   Iohexol Hives and Swelling    Pt reports swelling, redness, hives, and blisters    Atorvastatin     REACTION: aches   Celecoxib     REACTION: rash   Clinoril [Sulindac]     REACTION: rash   Nitrofurantoin    Spiriva Handihaler [Tiotropium Bromide Monohydrate] Other (See Comments)    Dry mouth   Sulfa Antibiotics     Intolerant but unrecalled.    Family History  Problem Relation Age of Onset   Stroke Mother    Heart attack Mother 570      deceased   Prostate cancer Father 659  Prostate cancer Brother    Colon cancer Neg Hx    Family history: Family history reviewed and not pertinent  Prior to Admission medications   Medication Sig  Start Date End Date Taking? Authorizing Provider  albuterol (PROAIR HFA) 108 (90 Base) MCG/ACT inhaler INHALE 1-2 PUFFS INTO THE LUNGS EVERY 6 (SIX) HOURS AS NEEDED FOR WHEEZING. 07/08/21   Tonia Ghent, MD  aspirin EC 81 MG tablet Take 1 tablet (81 mg total) by mouth daily. 04/24/15   Richardson Dopp T, PA-C  cyanocobalamin (VITAMIN B12) 1000 MCG/ML injection INJECT 1 ML (1,000 MCG TOTAL) INTO THE MUSCLE ONCE A WEEK. Patient not taking: Reported on 04/09/2022 03/31/22   Verlon Au, NP  furosemide (LASIX) 40 MG tablet Take 1 tablet (40 mg total) by mouth daily. PLEASE CALL [phone] TO SCHEDULE AN APPOINTMENT FOR FURTHER REFILLS. 06/10/22   End, Harrell Gave, MD  Iron, Ferrous Sulfate, 325 (65 Fe) MG TABS Take 325 mg by mouth daily. Patient not taking: Reported on 05/30/2022 11/03/21   Tonia Ghent, MD  LORazepam (ATIVAN) 1 MG tablet TAKE 1/4 TO 1/2 TABLET BY MOUTH EVERY 6 HOURS  AS NEEDED Patient not taking: Reported on 05/30/2022 10/27/21   Tonia Ghent, MD  OXYGEN Inhale into the lungs. 5 Liters    [provider]  Respiratory Therapy Supplies (FLUTTER) DEVI Use as directed Patient not taking: Reported on 05/30/2022 02/23/12   Clance, Armando Reichert, MD  SYMBICORT 160-4.5 MCG/ACT inhaler TAKE 2 PUFFS BY MOUTH TWICE A DAY 07/01/21   Tonia Ghent, MD  Syringe/Needle, Disp, (SYRINGE 3CC/25GX1") 25G X 1" 3 ML MISC 1 mL by Does not apply route once a week. Patient not taking: Reported on 05/30/2022 12/23/21   Sindy Guadeloupe, MD  Wound Dressings (CURITY Louretta Parma BOOT) MISC Dispense 4 rolls of unna boot wrap to use on legs with coban. Patient not taking: Reported on 04/09/2022 12/27/21   Tonia Ghent, MD   Physical Exam: Vitals:   06/10/22 2015 06/10/22 2315 06/10/22 2335 06/10/22 2335  BP: (!) 92/52     Pulse: 100 86 89   Resp:  (!) 25    Temp:    98.9 F (37.2 C)  TempSrc:    Oral  SpO2: 97% 98% 96%   Weight:      Height:       Constitutional: appears frail, NAD, calm, comfortable Eyes: PERRL, lids and conjunctivae normal ENMT: Mucous membranes are moist. Posterior pharynx clear of any exudate or lesions. Age-appropriate dentition. Hearing appropriate Neck: normal, supple, no masses, no thyromegaly Respiratory: Generalized decreased lung sounds bilaterally, no wheezing, no crackles. Normal respiratory effort. No accessory muscle use.  Cardiovascular: Regular rate and rhythm, no murmurs / rubs / gallops. No extremity edema. 2+ pedal pulses. No carotid bruits.  Abdomen: no tenderness, no masses palpated, no hepatosplenomegaly. Bowel sounds positive.  Musculoskeletal: no clubbing / cyanosis. No joint deformity upper and lower extremities. Good ROM, no contractures, no atrophy. Normal muscle tone.  Skin: no rashes, lesions, ulcers. No induration Neurologic: Sensation intact. Strength 5/5 in all 4.  Psychiatric: Normal judgment and insight. Alert and oriented x  3. Normal mood.   EKG: independently reviewed, showing sinus tachycardia with rate of 109, QTc 419  Chest x-ray on Admission: I personally reviewed and I agree with radiologist reading as below.  CT HEAD WO CONTRAST (5MM)  Result Date: 06/10/2022 CLINICAL DATA:  Head trauma, minor (Age >= 65y); Abdominal/flank pain, stone suspected; Lung nodule, > 84m. Mid to lower abdominal pain. EXAM: CT HEAD WITHOUT CONTRAST CT CHEST, ABDOMEN AND PELVIS WITHOUT CONTRAST TECHNIQUE: Contiguous axial images were obtained from the base of the skull through  the vertex without intravenous contrast. Multidetector CT imaging of the chest, abdomen and pelvis was performed following the standard protocol without IV contrast. RADIATION DOSE REDUCTION: This exam was performed according to the departmental dose-optimization program which includes automated exposure control, adjustment of the mA and/or kV according to patient size and/or use of iterative reconstruction technique. COMPARISON:  CT head 05/30/2022, CT chest 05/22/2015, CT abdomen pelvis 12/02/2016 FINDINGS: CT HEAD FINDINGS Brain: Normal anatomic configuration of the brain. Parenchymal volume loss is present, commensurate with the patient's age. There is moderate global ventriculomegaly which appears stable since prior examinations dating as far back as 12/02/2016. The degree of ventricular dilation, however, appears disproportionate to the degree cortical atrophy and changes of normal pressure hydrocephalus are not excluded. No evidence of acute intracranial hemorrhage or infarct. No abnormal mass effect or midline shift. No abnormal intra or extra-axial mass lesion. Cerebellum is unremarkable. Vascular: Moderate atherosclerotic calcification within the carotid siphons. No asymmetric hyperdense vasculature at the skull base. Skull: Normal. Negative for fracture or focal lesion. Sinuses/Orbits: Paranasal sinuses are clear. Ocular lenses have been removed. Orbits are  otherwise unremarkable. Other: Mastoid air cells and middle ear cavities are clear. Stable 8 mm nodule within the left frontal scalp, nonspecific, possibly representing a sebaceous or epidermoid cyst. CT CHEST FINDINGS Cardiovascular: Interval transcatheter aortic valve replacement. Extensive calcification of the mitral valve annulus. Mild coronary artery calcification. Global cardiac size within normal limits. No pericardial effusion. Central pulmonary arteries are stably enlarged in keeping with changes of pulmonary arterial hypertension. Mild atherosclerotic calcification within the thoracic aorta. No aortic aneurysm. 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 Visualized thyroid is unremarkable. No pathologic thoracic adenopathy. The esophagus is unremarkable. Lungs/Pleura: Rounded density seen within the right apex on previously performed chest radiograph corresponds to a lobulated mass within the right upper lobe measuring 2.5 x 3.9 cm at axial image # 33/3 suspicious for a primary bronchogenic neoplasm. This abuts the visceral pleura but does not appear to a retract or transgress the structure. The lungs are otherwise clear. No pneumothorax or pleural effusion. Central airways are widely patent. Musculoskeletal: No acute bone abnormality. No lytic or blastic bone lesion. CT ABDOMEN AND PELVIS FINDINGS Hepatobiliary: Choose 2 Pancreas: Unremarkable Spleen: Unremarkable Adrenals/Urinary Tract: The adrenal glands are unremarkable. The kidneys are normal in position. There is marked progressive atrophy of the left kidney right kidney is normal in size. Punctate 1-2 mm calculi within the left kidney likely represent nonobstructing urinary calculi. No hydronephrosis. No  ureteral calculi. Multiple simple cortical cysts are seen within the right kidney measuring up to 4.9 cm. No follow-up imaging is recommended for these lesions. Mild nonspecific perinephric inflammatory stranding noted. No perinephric fluid collections are identified. The bladder is unremarkable. Stomach/Bowel: Severe descending and sigmoid colonic diverticulosis without superimposed acute inflammatory change. The stomach, small bowel, and large bowel are otherwise unremarkable. No evidence of obstruction or focal inflammation. Appendix normal. No free intraperitoneal gas or fluid. Vascular/Lymphatic: Fusiform infrarenal abdominal aortic aneurysm is present measuring 5.6 cm in diameter (previously measuring 4.0 cm when measured in similar fashion). No periaortic inflammatory stranding or fluid collections are identified. Superimposed extensive aortoiliac atherosclerotic calcification. Prominent atherosclerotic calcification noted within the mid superior mesenteric artery, however, the degree of stenosis is not well assessed on this noncontrast examination.  No pathologic adenopathy within the abdomen and pelvis. Reproductive: No mass or other significant abnormality. Musculoskeletal: Osseous structures are age-appropriate. No acute bone abnormality. No lytic or blastic bone lesion. Other: No abdominal wall hernia. IMPRESSION: 1. No acute intracranial abnormality. No calvarial fracture. 2. Stable global ventriculomegaly. The degree of ventricular dilation, however, appears disproportionate to the degree of cortical atrophy and changes of normal pressure hydrocephalus are not excluded. Correlation with neurologic examination is recommended. 3. 3.9 cm lobulated mass within the right upper lobe suspicious for a primary bronchogenic neoplasm, new since prior examination. No evidence of metastatic disease within the chest, abdomen, and pelvis. PET CT examination is recommended for further evaluation. 4. Interval  transcatheter aortic valve replacement. Mild coronary artery calcification. 5. Morphologic changes in keeping with pulmonary arterial hypertension. 6. Progressive, severe atrophy of the left kidney. Minimal left nonobstructing nephrolithiasis. No urolithiasis. No hydronephrosis. 7. Severe distal colonic diverticulosis without superimposed acute inflammatory change. 8. 5.6 cm fusiform infrarenal abdominal aortic aneurysm. No periaortic inflammatory stranding or fluid collections are identified. Recommend referral to a vascular specialist. This recommendation follows ACR consensus guidelines: White Paper of the ACR Incidental Findings Committee II on Vascular Findings. J Am Coll Radiol 2013; 10:789-794. 9.  Aortic Atherosclerosis (ICD10-I70.0). Electronically Signed   By: Fidela Salisbury M.D.   On: 06/10/2022 19:45   CT Renal Stone Study  Result Date: 06/10/2022 CLINICAL DATA:  Head trauma, minor (Age >= 65y); Abdominal/flank pain, stone suspected; Lung nodule, > 36m. Mid to lower abdominal pain. EXAM: CT HEAD WITHOUT CONTRAST CT CHEST, ABDOMEN AND PELVIS WITHOUT CONTRAST TECHNIQUE: Contiguous axial images were obtained from the base of the skull through the vertex without intravenous contrast. Multidetector CT imaging of the chest, abdomen and pelvis was performed following the standard protocol without IV contrast. RADIATION DOSE REDUCTION: This exam was performed according to the departmental dose-optimization program which includes automated exposure control, adjustment of the mA and/or kV according to patient size and/or use of iterative reconstruction technique. COMPARISON:  CT head 05/30/2022, CT chest 05/22/2015, CT abdomen pelvis 12/02/2016 FINDINGS: CT HEAD FINDINGS Brain: Normal anatomic configuration of the brain. Parenchymal volume loss is present, commensurate with the patient's age. There is moderate global ventriculomegaly which appears stable since prior examinations dating as far back as  12/02/2016. The degree of ventricular dilation, however, appears disproportionate to the degree cortical atrophy and changes of normal pressure hydrocephalus are not excluded. No evidence of acute intracranial hemorrhage or infarct. No abnormal mass effect or midline shift. No abnormal intra or extra-axial mass lesion. Cerebellum is unremarkable. Vascular: Moderate atherosclerotic calcification within the carotid siphons. No asymmetric hyperdense vasculature at the skull base. Skull: Normal. Negative for fracture or focal lesion. Sinuses/Orbits: Paranasal sinuses are clear. Ocular lenses have been removed. Orbits are otherwise unremarkable. Other: Mastoid air cells and middle ear cavities are clear. Stable 8 mm nodule within the left frontal scalp, nonspecific, possibly representing a sebaceous or epidermoid cyst. CT CHEST FINDINGS Cardiovascular: Interval transcatheter aortic valve replacement. Extensive calcification of the mitral valve annulus. Mild coronary artery calcification. Global cardiac size within normal limits. No pericardial effusion. Central pulmonary arteries are stably enlarged in keeping with changes of pulmonary arterial hypertension. Mild atherosclerotic calcification within the thoracic aorta. No aortic aneurysm. 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 Visualized thyroid  is unremarkable. No pathologic thoracic adenopathy. The esophagus is unremarkable. Lungs/Pleura: Rounded density seen within the right apex on previously performed chest radiograph corresponds to a lobulated mass within the right upper lobe measuring 2.5 x 3.9 cm at axial image # 33/3 suspicious for a primary bronchogenic neoplasm. This abuts the visceral pleura but does not appear  to a retract or transgress the structure. The lungs are otherwise clear. No pneumothorax or pleural effusion. Central airways are widely patent. Musculoskeletal: No acute bone abnormality. No lytic or blastic bone lesion. CT ABDOMEN AND PELVIS FINDINGS Hepatobiliary: Choose 2 Pancreas: Unremarkable Spleen: Unremarkable Adrenals/Urinary Tract: The adrenal glands are unremarkable. The kidneys are normal in position. There is marked progressive atrophy of the left kidney right kidney is normal in size. Punctate 1-2 mm calculi within the left kidney likely represent nonobstructing urinary calculi. No hydronephrosis. No ureteral calculi. Multiple simple cortical cysts are seen within the right kidney measuring up to 4.9 cm. No follow-up imaging is recommended for these lesions. Mild nonspecific perinephric inflammatory stranding noted. No perinephric fluid collections are identified. The bladder is unremarkable. Stomach/Bowel: Severe descending and sigmoid colonic diverticulosis without superimposed acute inflammatory change. The stomach, small bowel, and large bowel are otherwise unremarkable. No evidence of obstruction or focal inflammation. Appendix normal. No free intraperitoneal gas or fluid. Vascular/Lymphatic: Fusiform infrarenal abdominal aortic aneurysm is present measuring 5.6 cm in diameter (previously measuring 4.0 cm when measured in similar fashion). No periaortic inflammatory stranding or fluid collections are identified. Superimposed extensive aortoiliac atherosclerotic calcification. Prominent atherosclerotic calcification noted within the mid superior mesenteric artery, however, the degree of stenosis is not well assessed on this noncontrast examination. No pathologic adenopathy within the abdomen and pelvis. Reproductive: No mass or other significant abnormality. Musculoskeletal: Osseous structures are age-appropriate. No acute bone abnormality. No lytic or blastic bone lesion. Other: No abdominal wall  hernia. IMPRESSION: 1. No acute intracranial abnormality. No calvarial fracture. 2. Stable global ventriculomegaly. The degree of ventricular dilation, however, appears disproportionate to the degree of cortical atrophy and changes of normal pressure hydrocephalus are not excluded. Correlation with neurologic examination is recommended. 3. 3.9 cm lobulated mass within the right upper lobe suspicious for a primary bronchogenic neoplasm, new since prior examination. No evidence of metastatic disease within the chest, abdomen, and pelvis. PET CT examination is recommended for further evaluation. 4. Interval transcatheter aortic valve replacement. Mild coronary artery calcification. 5. Morphologic changes in keeping with pulmonary arterial hypertension. 6. Progressive, severe atrophy of the left kidney. Minimal left nonobstructing nephrolithiasis. No urolithiasis. No hydronephrosis. 7. Severe distal colonic diverticulosis without superimposed acute inflammatory change. 8. 5.6 cm fusiform infrarenal abdominal aortic aneurysm. No periaortic inflammatory stranding or fluid collections are identified. Recommend referral to a vascular specialist. This recommendation follows ACR consensus guidelines: White Paper of the ACR Incidental Findings Committee II on Vascular Findings. J Am Coll Radiol 2013; 10:789-794. 9.  Aortic Atherosclerosis (ICD10-I70.0). Electronically Signed   By: Fidela Salisbury M.D.   On: 06/10/2022 19:45   CT Chest Wo Contrast  Result Date: 06/10/2022 CLINICAL DATA:  Head trauma, minor (Age >= 65y); Abdominal/flank pain, stone suspected; Lung nodule, > 38m. Mid to lower abdominal pain. EXAM: CT HEAD WITHOUT CONTRAST CT CHEST, ABDOMEN AND PELVIS WITHOUT CONTRAST TECHNIQUE: Contiguous axial images were obtained from the base of the skull through the vertex without intravenous contrast. Multidetector CT imaging of the chest, abdomen and pelvis was performed following the standard protocol without IV  contrast. RADIATION DOSE REDUCTION: This exam was  performed according to the departmental dose-optimization program which includes automated exposure control, adjustment of the mA and/or kV according to patient size and/or use of iterative reconstruction technique. COMPARISON:  CT head 05/30/2022, CT chest 05/22/2015, CT abdomen pelvis 12/02/2016 FINDINGS: CT HEAD FINDINGS Brain: Normal anatomic configuration of the brain. Parenchymal volume loss is present, commensurate with the patient's age. There is moderate global ventriculomegaly which appears stable since prior examinations dating as far back as 12/02/2016. The degree of ventricular dilation, however, appears disproportionate to the degree cortical atrophy and changes of normal pressure hydrocephalus are not excluded. No evidence of acute intracranial hemorrhage or infarct. No abnormal mass effect or midline shift. No abnormal intra or extra-axial mass lesion. Cerebellum is unremarkable. Vascular: Moderate atherosclerotic calcification within the carotid siphons. No asymmetric hyperdense vasculature at the skull base. Skull: Normal. Negative for fracture or focal lesion. Sinuses/Orbits: Paranasal sinuses are clear. Ocular lenses have been removed. Orbits are otherwise unremarkable. Other: Mastoid air cells and middle ear cavities are clear. Stable 8 mm nodule within the left frontal scalp, nonspecific, possibly representing a sebaceous or epidermoid cyst. CT CHEST FINDINGS Cardiovascular: Interval transcatheter aortic valve replacement. Extensive calcification of the mitral valve annulus. Mild coronary artery calcification. Global cardiac size within normal limits. No pericardial effusion. Central pulmonary arteries are stably enlarged in keeping with changes of pulmonary arterial hypertension. Mild atherosclerotic calcification within the thoracic aorta. No aortic aneurysm. 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 Visualized thyroid is unremarkable. No pathologic thoracic adenopathy. The esophagus is unremarkable. Lungs/Pleura: Rounded density seen within the right apex on previously performed chest radiograph corresponds to a lobulated mass within the right upper lobe measuring 2.5 x 3.9 cm at axial image # 33/3 suspicious for a primary bronchogenic neoplasm. This abuts the visceral pleura but does not appear to a retract or transgress the structure. The lungs are otherwise clear. No pneumothorax or pleural effusion. Central airways are widely patent. Musculoskeletal: No acute bone abnormality. No lytic or blastic bone lesion. CT ABDOMEN AND PELVIS FINDINGS Hepatobiliary: Choose 2 Pancreas: Unremarkable Spleen: Unremarkable Adrenals/Urinary Tract: The adrenal glands are unremarkable. The kidneys are normal in position. There is marked progressive atrophy of the left kidney right kidney is normal in size. Punctate 1-2 mm calculi within the left kidney likely represent nonobstructing urinary calculi. No hydronephrosis. No ureteral calculi. Multiple simple cortical cysts are seen within the right kidney measuring up to 4.9 cm. No follow-up imaging is recommended for these lesions. Mild nonspecific perinephric inflammatory stranding noted. No perinephric fluid collections are identified. The bladder is unremarkable. Stomach/Bowel: Severe descending and sigmoid colonic diverticulosis without superimposed acute inflammatory change. The stomach, small bowel, and large bowel are otherwise unremarkable. No evidence of obstruction or focal inflammation. Appendix normal. No free intraperitoneal gas or fluid. Vascular/Lymphatic: Fusiform infrarenal abdominal aortic aneurysm is present measuring 5.6 cm in diameter (previously measuring  4.0 cm when measured in similar fashion). No periaortic inflammatory stranding or fluid collections are identified. Superimposed extensive aortoiliac atherosclerotic calcification. Prominent atherosclerotic calcification noted within the mid superior mesenteric artery, however, the degree of stenosis is not well assessed on this noncontrast examination. No pathologic adenopathy within the abdomen and pelvis. Reproductive: No mass or other significant abnormality. Musculoskeletal: Osseous structures are age-appropriate. No acute bone abnormality. No lytic or blastic bone  lesion. Other: No abdominal wall hernia. IMPRESSION: 1. No acute intracranial abnormality. No calvarial fracture. 2. Stable global ventriculomegaly. The degree of ventricular dilation, however, appears disproportionate to the degree of cortical atrophy and changes of normal pressure hydrocephalus are not excluded. Correlation with neurologic examination is recommended. 3. 3.9 cm lobulated mass within the right upper lobe suspicious for a primary bronchogenic neoplasm, new since prior examination. No evidence of metastatic disease within the chest, abdomen, and pelvis. PET CT examination is recommended for further evaluation. 4. Interval transcatheter aortic valve replacement. Mild coronary artery calcification. 5. Morphologic changes in keeping with pulmonary arterial hypertension. 6. Progressive, severe atrophy of the left kidney. Minimal left nonobstructing nephrolithiasis. No urolithiasis. No hydronephrosis. 7. Severe distal colonic diverticulosis without superimposed acute inflammatory change. 8. 5.6 cm fusiform infrarenal abdominal aortic aneurysm. No periaortic inflammatory stranding or fluid collections are identified. Recommend referral to a vascular specialist. This recommendation follows ACR consensus guidelines: White Paper of the ACR Incidental Findings Committee II on Vascular Findings. J Am Coll Radiol 2013; 10:789-794. 9.  Aortic  Atherosclerosis (ICD10-I70.0). Electronically Signed   By: Fidela Salisbury M.D.   On: 06/10/2022 19:45   DG Chest Port 1 View  Result Date: 06/10/2022 CLINICAL DATA:  Possible sepsis EXAM: PORTABLE CHEST 1 VIEW COMPARISON:  05/30/2022 FINDINGS: Stable heart size status post TAVR. Aortic atherosclerosis. Focal nodular density at the medial aspect of the right upper lobe measuring approximately 4.0 cm in diameter. Mildly increased perihilar and bibasilar interstitial markings. No appreciable pleural fluid collection. No pneumothorax. IMPRESSION: 1. Focal nodular density at the medial aspect of the right upper lobe measuring approximately 4.0 cm in diameter. CT of the chest is recommended for further evaluation. 2. Mildly increased perihilar and bibasilar interstitial markings, which may reflect bronchitic lung changes versus mild interstitial edema. Electronically Signed   By: Davina Poke D.O.   On: 06/10/2022 18:41    Labs on Admission: I have personally reviewed following labs CBC: Recent Labs  Lab 06/10/22 1815  WBC 28.2*  NEUTROABS 26.7*  HGB 8.7*  HCT 29.4*  MCV 99.0  PLT 832   Basic Metabolic Panel: Recent Labs  Lab 06/10/22 1815  NA 139  K 2.9*  CL 96*  CO2 34*  GLUCOSE 115*  BUN 45*  CREATININE 1.91*  CALCIUM 7.9*   GFR: Estimated Creatinine Clearance: 32.9 mL/min (A) (by C-G formula based on SCr of 1.91 mg/dL (H)).  Liver Function Tests: Recent Labs  Lab 06/10/22 1815  AST 150*  ALT 63*  ALKPHOS 219*  BILITOT 1.6*  PROT 5.8*  ALBUMIN 2.2*   Coagulation Profile: Recent Labs  Lab 06/10/22 1815  INR 1.3*   Urine analysis:    Component Value Date/Time   COLORURINE YELLOW (A) 06/10/2022 1815   APPEARANCEUR CLOUDY (A) 06/10/2022 1815   LABSPEC 1.009 06/10/2022 1815   PHURINE 5.0 06/10/2022 1815   GLUCOSEU NEGATIVE 06/10/2022 1815   HGBUR MODERATE (A) 06/10/2022 1815   HGBUR negative 04/10/2010 1119   BILIRUBINUR NEGATIVE 06/10/2022 1815    BILIRUBINUR NEG 06/13/2016 1329   KETONESUR NEGATIVE 06/10/2022 1815   PROTEINUR 30 (A) 06/10/2022 1815   UROBILINOGEN negative 06/13/2016 1329   UROBILINOGEN 0.2 04/10/2010 1119   NITRITE NEGATIVE 06/10/2022 1815   LEUKOCYTESUR LARGE (A) 06/10/2022 1815   This document was prepared using Dragon Voice Recognition software and may include unintentional dictation errors.  Dr. Tobie Poet [facility]  If 7PM-7AM, please contact overnight-coverage provider If 7AM-7PM, please contact day coverage provider [email or web address]  06/10/2022, 11:50 PM

## 2022-06-10 NOTE — Telephone Encounter (Signed)
I spoke with pts wife (DPR signed); pt has had high fever 102 - 103 last night; pt has burning with urination. Pts wife spoke with [facility] and pt was supposed to go to ED but pt did not want to go to hospital Today temp is 97.7 after ibuprofen. No abd or back pain.Pt does not want to sit for hours anywhere.no available appts at [facility] or [facility]. Pt wife said pt will go to [facility] in [place] and be seen today. UC & ED precautions given and pt voiced understanding. Sending note to Dr Darnell Level who is in office and [place] pool.

## 2022-06-10 NOTE — Sepsis Progress Note (Signed)
Notified bedside nurse of need to draw lactic acid via secure chat. Awaiting response.

## 2022-06-10 NOTE — Progress Notes (Signed)
CODE SEPSIS - PHARMACY COMMUNICATION  **Broad Spectrum Antibiotics should be administered within 1 hour of Sepsis diagnosis**  Time Code Sepsis Called/Page Received: 1850  Antibiotics Ordered: Cefepime + vancomycin + metronidazole  Time of 1st antibiotic administration: 1935  Additional action taken by pharmacy: N/A  Benita Gutter 06/10/2022  7:03 PM

## 2022-06-10 NOTE — Telephone Encounter (Signed)
[facility] Night - Client TELEPHONE ADVICE RECORD [facility] Patient Name: [name] [name] Gender: Male DOB: 12-20-1933 Age: 88 Y 10 M 1 D Return Phone Number: [phone] (Primary), [phone] (Secondary) Address: City/ State/ Zip: [place]  [address] Client [place] Night - Client Client Site [place] - Night Provider Renford Dills - MD Contact Type Call Who Is Calling Patient / Member / Family / Caregiver Call Type Triage / Clinical Caller Name [place] Relationship To Patient Spouse Return Phone Number [phone] (Primary) Chief Complaint Prescription Refill or Medication Request (non symptomatic) Reason for Call Medication Question / Request Initial Comment Caller states pt needs a refill on his medication. He has some medication left, no current symptoms. Translation No Nurse Assessment Nurse: Laqueta Due, RN, [employer] (Eastern Time): 06/07/2022 1:45:22 PM Please select the assessment type ---Refill Additional Documentation ---Furosemide Does the patient have enough medication to last until the office opens? ---Yes Nurse: Laqueta Due, RN, Metallurgist (Eastern Time): 06/07/2022 1:44:07 PM Confirm and document reason for call. If symptomatic, describe symptoms. ---Pt has 5 tablets but dr increased to 1.5 daily. having occasional urinary burning and frequency. no fever. Does the patient have any new or worsening symptoms? ---Yes Will a triage be completed? ---Yes Related visit to physician within the last 2 weeks? ---Yes Does the PT have any chronic conditions? (i.e. diabetes, asthma, this includes High risk factors for pregnancy, etc.) ---Yes List chronic conditions. ---CHF, COPD Is this a behavioral health or substance abuse call? ---No PLEASE NOTE: All timestamps contained within this report are represented as [place] Standard Time. CONFIDENTIALTY NOTICE: This fax  transmission is intended only for the addressee. It contains information that is legally privileged, confidential or otherwise protected from use or disclosure. If you are not the intended recipient, you are strictly prohibited from reviewing, disclosing, copying using or disseminating any of this information or taking any action in reliance on or regarding this information. If you have received this fax in error, please notify us immediately by telephone so that we can arrange for its return to [place]. Phone: [phone], Toll-Free: [phone], Fax: [phone] Page: 2 of 2 Call Id: [id number] Guidelines Guideline Title Affirmed Question Affirmed Notes Nurse Date/Time Eilene Ghazi Time) Urination Pain - Male Patient sounds very sick or weak to the triager Laqueta Due, RN, [employer] 06/07/2022 1:48:45 PM Disp. Time Eilene Ghazi Time) Disposition Final User 06/07/2022 1:51:51 PM Go to ED Now (or PCP triage) Yes Laqueta Due, RN, Amber Final Disposition 06/07/2022 1:51:51 PM Go to ED Now (or PCP triage) Yes Laqueta Due, RN, Amber Caller Disagree/Comply Comply Caller Understands Yes PreDisposition Did not know what to do Care Advice Given Per Guideline GO TO ED NOW (OR PCP TRIAGE): CARE ADVICE given per Urination Pain - Male (Adult) guideline. Referrals GO TO FACILITY REFUSE

## 2022-06-10 NOTE — Assessment & Plan Note (Addendum)
-   Suspect secondary to UTI - Ceftriaxone 2 g IV daily - Vancomycin per pharmacy - Continue follow-up with lactic acid, procalcitonin, blood cultures x 2 - Admit to progressive cardiac, inpatient

## 2022-06-10 NOTE — ED Provider Notes (Signed)
[facility] Provider Note    Event Date/Time   First MD Initiated Contact with Patient 06/10/22 1750     (approximate)   History   Weakness and Fever   HPI  [name] [name] is a 88 y.o. male with AAA, anxiety, COPD, OSA on CPAP, CHF who comes in with concerns for fever.  Patient reportedly not feeling well for the past few days but there was concern that he had positive leuk esterases 2 days ago with a home health nurse visit but was never started on any antibiotics.  Today noted to be febrile.  Patient reports he is on 5 L of oxygen at baseline.  He denies any falls hitting his head.  He reports that he feels like he has to urinate but denies any abdominal pain.  On review of records from the telephone encounter it looks like patient was given some ibuprofen for a fever of 102 initially did not want to come into the hospital but then they ended up calling EMS.  They were concerned with some burning with urination.   Physical Exam   Triage Vital Signs: There were no vitals taken for this visit.   Most recent vital signs: Vitals:   06/10/22 1826  BP: 130/78  Pulse: 66  Resp: 20  Temp: 98.8 F (37.1 C)  SpO2: 100%     General: Awake, no distress.  CV:  Good peripheral perfusion.  Resp:  Normal effort.  On baseline 5 L Abd:  No distention.  Soft nontender Other:  Moves all extremities well.  Some trace edema noted in legs Testicles examined without any erythema or redness noted to them  ED Results / Procedures / Treatments   Labs (all labs ordered are listed, but only abnormal results are displayed) Labs Reviewed  RESP PANEL BY RT-PCR (RSV, FLU A&B, COVID)  [id number]  CULTURE, BLOOD (ROUTINE X 2)  CULTURE, BLOOD (ROUTINE X 2)  URINE CULTURE  LACTIC ACID, PLASMA  LACTIC ACID, PLASMA  COMPREHENSIVE METABOLIC PANEL  CBC WITH DIFFERENTIAL/PLATELET  PROTIME-INR  APTT  URINALYSIS, COMPLETE (UACMP) WITH MICROSCOPIC  BRAIN NATRIURETIC PEPTIDE   TROPONIN I (HIGH SENSITIVITY)     EKG  My interpretation of EKG:  Sinus tachycardia rate of 119 without any ST elevation or T wave inversions, PVC  RADIOLOGY I have reviewed the xray personally and interpreted no evidence of pneumonia   PROCEDURES:  Critical Care performed: Yes, see critical care procedure note(s)  .1-3 Lead EKG Interpretation  Performed by: Vanessa McDougal, MD Authorized by: Vanessa [place], MD     Interpretation: normal     ECG rate assessment: normal     Rhythm: sinus rhythm     Ectopy: none     Conduction: normal   .Critical Care  Performed by: Vanessa [place], MD Authorized by: Vanessa [place], MD   Critical care provider statement:    Critical care time (minutes):  30   Critical care was necessary to treat or prevent imminent or life-threatening deterioration of the following conditions:  Sepsis   Critical care was time spent personally by me on the following activities:  Development of treatment plan with patient or surrogate, discussions with consultants, evaluation of patient's response to treatment, examination of patient, ordering and review of laboratory studies, ordering and review of radiographic studies, ordering and performing treatments and interventions, pulse oximetry, re-evaluation of patient's condition and review of old charts    [facility] ED: Medications  ceFEPIme (MAXIPIME) 2 g in sodium chloride 0.9 % 100 mL IVPB (has no administration in time range)  metroNIDAZOLE (FLAGYL) IVPB 500 mg (has no administration in time range)  vancomycin (VANCOCIN) IVPB 1000 mg/200 mL premix (has no administration in time range)  sodium chloride 0.9 % bolus 1,000 mL (1,000 mLs Intravenous New Bag/Given 06/10/22 1807)  sodium chloride 0.9 % bolus 500 mL (500 mLs Intravenous New Bag/Given 06/10/22 1829)     IMPRESSION / MDM / [place] / ED COURSE  I reviewed the triage vital signs and the nursing notes.   Patient's presentation  is most consistent with acute presentation with potential threat to life or bodily function.   Patient comes in tachycardic.  Initially hypotensive fever treated at home.  Patient received ibuprofen prior to coming in it sounds like.  Attempted to call wife to collaborate this but unable to get a hold of her.  Suspect most likely urosepsis but will also test for COVID, flu, pneumonia.  Abdomen soft and nontender at this time.   6:46 PM patient's white count came back significantly elevated.  Will start patient broad-spectrum antibiotics due to unclear source.  Patient x-ray concerning for some interstitial markers concerning for edema.  Will need to gently give fluids and holding off on full 30 cc/kg.  There is concern for a possible nodule and recommended a CT of the chest.  CT head ordered to evaluate for any intercranial hemorrhage and CT renal to make sure no infected kidney stone.  Patient does have AAA unable to give contrast but we can look to make sure there is no evidence of any bleeding given the low blood pressures but at this time I suspect this is more likely urosepsis.   Troponin slightly elevated but most likely demand.  Creatinine is elevated at 1.9 and potassium is 2.9 we will give some IV repletion.  LFTs are slightly elevated but he was nontender in his right upper quadrant.  His hemoglobin is low but is around his baseline.   + uti. Pt CT with possible cancer and enlarged aorta.  Discussed with fam-- unlikely surgical candidate but they will discuss this and vascular can be consulted inpt if they decide on it.   Will admit for urosepsis.     The patient is on the cardiac monitor to evaluate for evidence of arrhythmia and/or significant heart rate changes.      FINAL CLINICAL IMPRESSION(S) / ED DIAGNOSES   Final diagnoses:  Sepsis, due to unspecified organism, unspecified whether acute organ dysfunction present [facility])  Acute cystitis without hematuria     Rx / DC  Orders   ED Discharge Orders     None        Note:  This document was prepared using Dragon voice recognition software and may include unintentional dictation errors.   Vanessa Newald, MD 06/10/22 [phone]

## 2022-06-10 NOTE — Telephone Encounter (Signed)
Good Morning,   Could you schedule this patient an overdue 6 month follow up appointment? This patient was last seen by Dr. Saunders Revel on 09/25/2021. Thank you so much.

## 2022-06-11 DIAGNOSIS — R6520 Severe sepsis without septic shock: Secondary | ICD-10-CM | POA: Diagnosis not present

## 2022-06-11 DIAGNOSIS — A419 Sepsis, unspecified organism: Secondary | ICD-10-CM | POA: Diagnosis not present

## 2022-06-11 LAB — BLOOD CULTURE ID PANEL (REFLEXED) - BCID2

## 2022-06-11 LAB — BASIC METABOLIC PANEL
Anion gap: 8 (ref 5–15)
BUN: 46 mg/dL — ABNORMAL HIGH (ref 8–23)
CO2: 31 mmol/L (ref 22–32)
Calcium: 7.7 mg/dL — ABNORMAL LOW (ref 8.9–10.3)
Chloride: 101 mmol/L (ref 98–111)
Creatinine, Ser: 1.75 mg/dL — ABNORMAL HIGH (ref 0.61–1.24)
GFR, Estimated: 37 mL/min — ABNORMAL LOW (ref >60)
Glucose, Bld: 132 mg/dL — ABNORMAL HIGH (ref 70–99)
Potassium: 3.1 mmol/L — ABNORMAL LOW (ref 3.5–5.1)
Sodium: 140 mmol/L (ref 135–145)

## 2022-06-11 LAB — CBC
HCT: 27.8 % — ABNORMAL LOW (ref 39.0–52.0)
Hemoglobin: 8.3 g/dL — ABNORMAL LOW (ref 13.0–17.0)
MCH: 29.5 pg (ref 26.0–34.0)
MCHC: 29.9 g/dL — ABNORMAL LOW (ref 30.0–36.0)
MCV: 98.9 fL (ref 80.0–100.0)
Platelets: 246 10*3/uL (ref 150–400)
RBC: 2.81 MIL/uL — ABNORMAL LOW (ref 4.22–5.81)
RDW: 14.8 % (ref 11.5–15.5)
WBC: 25.2 10*3/uL — ABNORMAL HIGH (ref 4.0–10.5)
nRBC: 0 % (ref 0.0–0.2)

## 2022-06-11 LAB — PROCALCITONIN: Procalcitonin: 8.42 ng/mL

## 2022-06-11 LAB — RESP PANEL BY RT-PCR (RSV, FLU A&B, COVID)  RVPGX2
Influenza A by PCR: NEGATIVE
Influenza B by PCR: NEGATIVE
Resp Syncytial Virus by PCR: NEGATIVE
SARS Coronavirus 2 by RT PCR: NEGATIVE

## 2022-06-11 LAB — PHOSPHORUS: Phosphorus: 2.4 mg/dL — ABNORMAL LOW (ref 2.5–4.6)

## 2022-06-11 LAB — TROPONIN I (HIGH SENSITIVITY): Troponin I (High Sensitivity): 24 ng/L — ABNORMAL HIGH (ref <18)

## 2022-06-11 LAB — URINE CULTURE

## 2022-06-11 LAB — LACTIC ACID, PLASMA: Lactic Acid, Venous: 1.7 mmol/L (ref 0.5–1.9)

## 2022-06-11 LAB — MAGNESIUM: Magnesium: 1.7 mg/dL (ref 1.7–2.4)

## 2022-06-11 MED ORDER — POTASSIUM CHLORIDE CRYS ER 20 MEQ PO TBCR
30.0000 meq | EXTENDED_RELEASE_TABLET | Freq: Once | ORAL | Status: AC
  Administered 2022-06-11: 30 meq via ORAL
  Filled 2022-06-11: qty 2

## 2022-06-11 MED ORDER — VANCOMYCIN HCL IN DEXTROSE 1-5 GM/200ML-% IV SOLN
1000.0000 mg | INTRAVENOUS | Status: DC
  Administered 2022-06-11: 1000 mg via INTRAVENOUS
  Filled 2022-06-11: qty 200

## 2022-06-11 MED ORDER — ALBUTEROL SULFATE (2.5 MG/3ML) 0.083% IN NEBU: 2.5000 mg | INHALATION_SOLUTION | Freq: Four times a day (QID) | RESPIRATORY_TRACT | Status: DC | PRN

## 2022-06-11 NOTE — ED Notes (Signed)
Pt given a breakfast tray.

## 2022-06-11 NOTE — Progress Notes (Signed)
PHARMACY - PHYSICIAN COMMUNICATION CRITICAL VALUE ALERT - BLOOD CULTURE IDENTIFICATION (BCID)  [name] [name] is an 88 y.o. male who presented to [facility] on 06/10/2022 with a chief complaint of weakness.   Assessment:  Admitted with severe sepsis suspected secondary to UTI. Blood cultures growing 2/4 E. Coli no resistance. Urine culture in process.  Name of physician (or Provider) Contacted: Dr. Cruzita Lederer  Current antibiotics: ceftriaxone IV 2 grams every 24 hours and vancomycin IV 1,000 mg every 24 hours  Changes to prescribed antibiotics recommended:  Discontinue vancomycin. Continue ceftriaxone IV 2 grams every 24 hours Recommendations accepted by provider  Results for orders placed or performed during the hospital encounter of 06/10/22  Blood Culture ID Panel (Reflexed) (Collected: 06/10/2022  6:15 PM)  Result Value Ref Range   Enterococcus faecalis NOT DETECTED NOT DETECTED   Enterococcus Faecium NOT DETECTED NOT DETECTED   Listeria monocytogenes NOT DETECTED NOT DETECTED   Staphylococcus species NOT DETECTED NOT DETECTED   Staphylococcus aureus (BCID) NOT DETECTED NOT DETECTED   Staphylococcus epidermidis NOT DETECTED NOT DETECTED   Staphylococcus lugdunensis NOT DETECTED NOT DETECTED   Streptococcus species NOT DETECTED NOT DETECTED   Streptococcus agalactiae NOT DETECTED NOT DETECTED   Streptococcus pneumoniae NOT DETECTED NOT DETECTED   Streptococcus pyogenes NOT DETECTED NOT DETECTED   A.calcoaceticus-baumannii NOT DETECTED NOT DETECTED   Bacteroides fragilis NOT DETECTED NOT DETECTED   Enterobacterales DETECTED (A) NOT DETECTED   Enterobacter cloacae complex NOT DETECTED NOT DETECTED   Escherichia coli DETECTED (A) NOT DETECTED   Klebsiella aerogenes NOT DETECTED NOT DETECTED   Klebsiella oxytoca NOT DETECTED NOT DETECTED   Klebsiella pneumoniae NOT DETECTED NOT DETECTED   Proteus species NOT DETECTED NOT DETECTED   Salmonella species NOT DETECTED NOT DETECTED    Serratia marcescens NOT DETECTED NOT DETECTED   Haemophilus influenzae NOT DETECTED NOT DETECTED   Neisseria meningitidis NOT DETECTED NOT DETECTED   Pseudomonas aeruginosa NOT DETECTED NOT DETECTED   Stenotrophomonas maltophilia NOT DETECTED NOT DETECTED   Candida albicans NOT DETECTED NOT DETECTED   Candida auris NOT DETECTED NOT DETECTED   Candida glabrata NOT DETECTED NOT DETECTED   Candida krusei NOT DETECTED NOT DETECTED   Candida parapsilosis NOT DETECTED NOT DETECTED   Candida tropicalis NOT DETECTED NOT DETECTED   Cryptococcus neoformans/gattii NOT DETECTED NOT DETECTED   CTX-M ESBL NOT DETECTED NOT DETECTED   Carbapenem resistance IMP NOT DETECTED NOT DETECTED   Carbapenem resistance KPC NOT DETECTED NOT DETECTED   Carbapenem resistance NDM NOT DETECTED NOT DETECTED   Carbapenem resist OXA 48 LIKE NOT DETECTED NOT DETECTED   Carbapenem resistance VIM NOT DETECTED NOT DETECTED     Glean Salvo, PharmD, BCPS Clinical Pharmacist  06/11/2022 8:59 AM

## 2022-06-11 NOTE — Progress Notes (Signed)
PROGRESS NOTE  [name] [name] TGY:[id number] DOB: 1934-05-18 DOA: 06/10/2022 PCP: Tonia Ghent, MD   LOS: 1 day   Brief Narrative / Interim history: 88 year old male with history of COPD, diastolic CHF, anxiety comes to the hospital with complaints of fever and chills.  He was found to have significant leukocytosis in the ED with a WBC of 28.2, at home he was also reportedly febrile @ 102. Also reported dysuria  Subjective / 24h Interval events: Feels a little bit better this morning.  No nausea or vomiting.  Assesement and Plan: Principal Problem:   Severe sepsis with acute organ dysfunction (HCC) Active Problems:   Risk for falls   S/P TAVR (transcatheter aortic valve replacement)   Principal problem Severe sepsis due to E. coli bacteremia, UTI -met sepsis criteria with fever, tachycardia, elevated WBC and no source.  Continue ceftriaxone, monitor sensitivities.  Active problems COPD with chronic hypoxic respiratory failure-on 5 L at home, continue  Acute kidney injury-creatinine 1.9 on admission, improving with fluids.  Hypokalemia-replete and continue to monitor  Infrarenal AAA-outpatient follow-up with vascular  Right upper lobe mass-approximately 3.9 cm, needs outpatient follow-up PCP  Acute on chronic anemia -hemoglobin stable, no bleeding  Scheduled Meds:  aspirin EC  81 mg Oral Daily   ferrous sulfate  325 mg Oral Daily   mometasone-formoterol  2 puff Inhalation BID   Continuous Infusions:  cefTRIAXone (ROCEPHIN)  IV Stopped (06/11/22 0858)   PRN Meds:.acetaminophen **OR** acetaminophen, albuterol, ondansetron **OR** ondansetron (ZOFRAN) IV, senna-docusate  Current Outpatient Medications  Medication Instructions   albuterol (PROAIR HFA) 108 (90 Base) MCG/ACT inhaler INHALE 1-2 PUFFS INTO THE LUNGS EVERY 6 (SIX) HOURS AS NEEDED FOR WHEEZING.   aspirin EC 81 mg, Oral, Daily   cyanocobalamin (VITAMIN B12) 1,000 mcg, Intramuscular, Weekly   furosemide  (LASIX) 40 mg, Oral, Daily, PLEASE CALL [phone] TO SCHEDULE AN APPOINTMENT FOR FURTHER REFILLS.   Iron (Ferrous Sulfate) 325 mg, Oral, Daily   LORazepam (ATIVAN) 1 MG tablet TAKE 1/4 TO 1/2 TABLET BY MOUTH EVERY 6 HOURS AS NEEDED   OXYGEN Inhalation, 5 Liters   Respiratory Therapy Supplies (FLUTTER) DEVI Use as directed   SYMBICORT 160-4.5 MCG/ACT inhaler TAKE 2 PUFFS BY MOUTH TWICE A DAY   Wound Dressings (CURITY UNNA BOOT) MISC Dispense 4 rolls of unna boot wrap to use on legs with coban.    Diet Orders (From admission, onward)     Start     Ordered   06/10/22 2117  Diet Heart Room service appropriate? Yes; Fluid consistency: Thin  Diet effective now       Question Answer Comment  Room service appropriate? Yes   Fluid consistency: Thin      06/10/22 2118            DVT prophylaxis: Place TED hose Start: 06/10/22 2116   Lab Results  Component Value Date   PLT 246 06/11/2022      Code Status: Full Code  Family Communication: no family at bedside   Status is: Inpatient  Remains inpatient appropriate because: severity of illness  Level of care: Progressive  Consultants:  None   Objective: Vitals:   06/11/22 0427 06/11/22 0620 06/11/22 0838 06/11/22 0839  BP:  (!) 107/56    Pulse:  79  80  Resp:  (!) 24  18  Temp: 98.7 F (37.1 C)  97.8 F (36.6 C)   TempSrc: Axillary  Oral   SpO2:  100%  98%  Weight:  Height:        Intake/Output Summary (Last 24 hours) at 06/11/2022 1051 Last data filed at 06/11/2022 0315 Gross per 24 hour  Intake 2600 ml  Output --  Net 2600 ml   Wt Readings from Last 3 Encounters:  06/10/22 99.8 kg  05/31/22 101.1 kg  05/26/22 100.7 kg    Examination:  Constitutional: NAD Eyes: no scleral icterus ENMT: Mucous membranes are moist.  Neck: normal, supple Respiratory: clear to auscultation bilaterally, no wheezing, no crackles. Normal respiratory effort. No accessory muscle use.  Cardiovascular: Regular rate and  rhythm, no murmurs / rubs / gallops. No LE edema.  Abdomen: non distended, no tenderness. Bowel sounds positive.  Musculoskeletal: no clubbing / cyanosis.   Data Reviewed: I have independently reviewed following labs and imaging studies   CBC Recent Labs  Lab 06/10/22 1815 06/11/22 0444  WBC 28.2* 25.2*  HGB 8.7* 8.3*  HCT 29.4* 27.8*  PLT 298 246  MCV 99.0 98.9  MCH 29.3 29.5  MCHC 29.6* 29.9*  RDW 14.7 14.8  LYMPHSABS 0.2*  --   MONOABS 0.9  --   EOSABS 0.0  --   BASOSABS 0.1  --     Recent Labs  Lab 06/10/22 1815 06/10/22 2300 06/11/22 0444  NA 139  --  140  K 2.9*  --  3.1*  CL 96*  --  101  CO2 34*  --  31  GLUCOSE 115*  --  132*  BUN 45*  --  46*  CREATININE 1.91*  --  1.75*  CALCIUM 7.9*  --  7.7*  AST 150*  --   --   ALT 63*  --   --   ALKPHOS 219*  --   --   BILITOT 1.6*  --   --   ALBUMIN 2.2*  --   --   MG 1.7  --   --   PROCALCITON 8.42  --   --   LATICACIDVEN  --  1.7  --   INR 1.3*  --   --   BNP 69.6  --   --     ------------------------------------------------------------------------------------------------------------------ No results for input(s): "CHOL", "HDL", "LDLCALC", "TRIG", "CHOLHDL", "LDLDIRECT" in the last 72 hours.  Lab Results  Component Value Date   HGBA1C 5.3 03/26/2018   ------------------------------------------------------------------------------------------------------------------ No results for input(s): "TSH", "T4TOTAL", "T3FREE", "THYROIDAB" in the last 72 hours.  Invalid input(s): "FREET3"  Cardiac Enzymes No results for input(s): "CKMB", "TROPONINI", "MYOGLOBIN" in the last 168 hours.  Invalid input(s): "CK" ------------------------------------------------------------------------------------------------------------------    Component Value Date/Time   BNP 69.6 06/10/2022 1815   BNP 18 11/01/2021 1607    CBG: No results for input(s): "GLUCAP" in the last 168 hours.  Recent Results (from the past 240  hour(s))  Resp panel by RT-PCR (RSV, Flu A&B, Covid) Anterior Nasal Swab     Status: None   Collection Time: 06/10/22  6:15 PM   Specimen: Anterior Nasal Swab  Result Value Ref Range Status   SARS Coronavirus 2 by RT PCR NEGATIVE NEGATIVE Final    Comment: (NOTE) SARS-CoV-2 target nucleic acids are NOT DETECTED.  The SARS-CoV-2 RNA is generally detectable in upper respiratory specimens during the acute phase of infection. The lowest concentration of SARS-CoV-2 viral copies this assay can detect is 138 copies/mL. A negative result does not preclude SARS-Cov-2 infection and should not be used as the sole basis for treatment or other patient management decisions. A negative result may occur with  improper specimen  collection/handling, submission of specimen other than nasopharyngeal swab, presence of viral mutation(s) within the areas targeted by this assay, and inadequate number of viral copies(<138 copies/mL). A negative result must be combined with clinical observations, patient history, and epidemiological information. The expected result is Negative.  Fact Sheet for Patients:  EntrepreneurPulse.com.au  Fact Sheet for Healthcare Providers:  IncredibleEmployment.be  This test is no t yet approved or cleared by the [place] FDA and  has been authorized for detection and/or diagnosis of SARS-CoV-2 by FDA under an Emergency Use Authorization (EUA). This EUA will remain  in effect (meaning this test can be used) for the duration of the COVID-19 declaration under Section 564(b)(1) of the Act, 21 U.S.C.section 360bbb-3(b)(1), unless the authorization is terminated  or revoked sooner.       Influenza A by PCR NEGATIVE NEGATIVE Final   Influenza B by PCR NEGATIVE NEGATIVE Final    Comment: (NOTE) The Xpert Xpress SARS-CoV-2/FLU/RSV plus assay is intended as an aid in the diagnosis of influenza from Nasopharyngeal swab specimens and should not  be used as a sole basis for treatment. Nasal washings and aspirates are unacceptable for Xpert Xpress SARS-CoV-2/FLU/RSV testing.  Fact Sheet for Patients: EntrepreneurPulse.com.au  Fact Sheet for Healthcare Providers: IncredibleEmployment.be  This test is not yet approved or cleared by the [place] FDA and has been authorized for detection and/or diagnosis of SARS-CoV-2 by FDA under an Emergency Use Authorization (EUA). This EUA will remain in effect (meaning this test can be used) for the duration of the COVID-19 declaration under Section 564(b)(1) of the Act, 21 U.S.C. section 360bbb-3(b)(1), unless the authorization is terminated or revoked.     Resp Syncytial Virus by PCR NEGATIVE NEGATIVE Final    Comment: (NOTE) Fact Sheet for Patients: EntrepreneurPulse.com.au  Fact Sheet for Healthcare Providers: IncredibleEmployment.be  This test is not yet approved or cleared by the [place] FDA and has been authorized for detection and/or diagnosis of SARS-CoV-2 by FDA under an Emergency Use Authorization (EUA). This EUA will remain in effect (meaning this test can be used) for the duration of the COVID-19 declaration under Section 564(b)(1) of the Act, 21 U.S.C. section 360bbb-3(b)(1), unless the authorization is terminated or revoked.  Performed at [facility], [address] [place], [place] [address]   Blood Culture (routine x 2)     Status: None (Preliminary result)   Collection Time: 06/10/22  6:15 PM   Specimen: BLOOD  Result Value Ref Range Status   Specimen Description BLOOD BLOOD LEFT ARM  Final   Special Requests   Final    BOTTLES DRAWN AEROBIC AND ANAEROBIC Blood Culture results may not be optimal due to an inadequate volume of blood received in culture bottles   Culture  Setup Time   Final    GRAM NEGATIVE RODS ANAEROBIC BOTTLE ONLY CRITICAL RESULT CALLED TO, READ BACK  BY AND VERIFIED WITH: Pamelia Hoit 06/11/22 0841 MW Performed at [facility], [address] [place], [place] [address]    Culture GRAM NEGATIVE RODS  Final   Report Status PENDING  Incomplete  Blood Culture (routine x 2)     Status: None (Preliminary result)   Collection Time: 06/10/22  6:15 PM   Specimen: BLOOD  Result Value Ref Range Status   Specimen Description BLOOD BLOOD RIGHT ARM  Final   Special Requests   Final    BOTTLES DRAWN AEROBIC AND ANAEROBIC Blood Culture results may not be optimal due to an inadequate volume  of blood received in culture bottles   Culture  Setup Time   Final    GRAM NEGATIVE RODS ANAEROBIC BOTTLE ONLY Organism ID to follow CRITICAL RESULT CALLED TO, READ BACK BY AND VERIFIED WITH: Pamelia Hoit 06/11/22 2440 MW Performed at [facility], [address] [place], [place] [address]    Culture GRAM NEGATIVE RODS  Final   Report Status PENDING  Incomplete  Blood Culture ID Panel (Reflexed)     Status: Abnormal   Collection Time: 06/10/22  6:15 PM  Result Value Ref Range Status   Enterococcus faecalis NOT DETECTED NOT DETECTED Final   Enterococcus Faecium NOT DETECTED NOT DETECTED Final   Listeria monocytogenes NOT DETECTED NOT DETECTED Final   Staphylococcus species NOT DETECTED NOT DETECTED Final   Staphylococcus aureus (BCID) NOT DETECTED NOT DETECTED Final   Staphylococcus epidermidis NOT DETECTED NOT DETECTED Final   Staphylococcus lugdunensis NOT DETECTED NOT DETECTED Final   Streptococcus species NOT DETECTED NOT DETECTED Final   Streptococcus agalactiae NOT DETECTED NOT DETECTED Final   Streptococcus pneumoniae NOT DETECTED NOT DETECTED Final   Streptococcus pyogenes NOT DETECTED NOT DETECTED Final   A.calcoaceticus-baumannii NOT DETECTED NOT DETECTED Final   Bacteroides fragilis NOT DETECTED NOT DETECTED Final   Enterobacterales DETECTED (A) NOT DETECTED Final    Comment: Enterobacterales represent a large order of  gram negative bacteria, not a single organism. CRITICAL RESULT CALLED TO, READ BACK BY AND VERIFIED WITH: MORGAN GOBBLE 06/11/22 0841 MW    Enterobacter cloacae complex NOT DETECTED NOT DETECTED Final   Escherichia coli DETECTED (A) NOT DETECTED Final    Comment: CRITICAL RESULT CALLED TO, READ BACK BY AND VERIFIED WITH: MORGAN GOBBLE 06/11/22 0841 MW    Klebsiella aerogenes NOT DETECTED NOT DETECTED Final   Klebsiella oxytoca NOT DETECTED NOT DETECTED Final   Klebsiella pneumoniae NOT DETECTED NOT DETECTED Final   Proteus species NOT DETECTED NOT DETECTED Final   Salmonella species NOT DETECTED NOT DETECTED Final   Serratia marcescens NOT DETECTED NOT DETECTED Final   Haemophilus influenzae NOT DETECTED NOT DETECTED Final   Neisseria meningitidis NOT DETECTED NOT DETECTED Final   Pseudomonas aeruginosa NOT DETECTED NOT DETECTED Final   Stenotrophomonas maltophilia NOT DETECTED NOT DETECTED Final   Candida albicans NOT DETECTED NOT DETECTED Final   Candida auris NOT DETECTED NOT DETECTED Final   Candida glabrata NOT DETECTED NOT DETECTED Final   Candida krusei NOT DETECTED NOT DETECTED Final   Candida parapsilosis NOT DETECTED NOT DETECTED Final   Candida tropicalis NOT DETECTED NOT DETECTED Final   Cryptococcus neoformans/gattii NOT DETECTED NOT DETECTED Final   CTX-M ESBL NOT DETECTED NOT DETECTED Final   Carbapenem resistance IMP NOT DETECTED NOT DETECTED Final   Carbapenem resistance KPC NOT DETECTED NOT DETECTED Final   Carbapenem resistance NDM NOT DETECTED NOT DETECTED Final   Carbapenem resist OXA 48 LIKE NOT DETECTED NOT DETECTED Final   Carbapenem resistance VIM NOT DETECTED NOT DETECTED Final    Comment: Performed at [facility], [address], [place], [place] [address]     Radiology Studies: CT HEAD WO CONTRAST (5MM)  Result Date: 06/10/2022 CLINICAL DATA:  Head trauma, minor (Age >= 65y); Abdominal/flank pain, stone suspected; Lung nodule, > 31m. Mid  to lower abdominal pain. EXAM: CT HEAD WITHOUT CONTRAST CT CHEST, ABDOMEN AND PELVIS WITHOUT CONTRAST TECHNIQUE: Contiguous axial images were obtained from the base of the skull through the vertex without intravenous contrast. Multidetector CT imaging of the chest, abdomen  and pelvis was performed following the standard protocol without IV contrast. RADIATION DOSE REDUCTION: This exam was performed according to the departmental dose-optimization program which includes automated exposure control, adjustment of the mA and/or kV according to patient size and/or use of iterative reconstruction technique. COMPARISON:  CT head 05/30/2022, CT chest 05/22/2015, CT abdomen pelvis 12/02/2016 FINDINGS: CT HEAD FINDINGS Brain: Normal anatomic configuration of the brain. Parenchymal volume loss is present, commensurate with the patient's age. There is moderate global ventriculomegaly which appears stable since prior examinations dating as far back as 12/02/2016. The degree of ventricular dilation, however, appears disproportionate to the degree cortical atrophy and changes of normal pressure hydrocephalus are not excluded. No evidence of acute intracranial hemorrhage or infarct. No abnormal mass effect or midline shift. No abnormal intra or extra-axial mass lesion. Cerebellum is unremarkable. Vascular: Moderate atherosclerotic calcification within the carotid siphons. No asymmetric hyperdense vasculature at the skull base. Skull: Normal. Negative for fracture or focal lesion. Sinuses/Orbits: Paranasal sinuses are clear. Ocular lenses have been removed. Orbits are otherwise unremarkable. Other: Mastoid air cells and middle ear cavities are clear. Stable 8 mm nodule within the left frontal scalp, nonspecific, possibly representing a sebaceous or epidermoid cyst. CT CHEST FINDINGS Cardiovascular: Interval transcatheter aortic valve replacement. Extensive calcification of the mitral valve annulus. Mild coronary artery calcification.  Global cardiac size within normal limits. No pericardial effusion. Central pulmonary arteries are stably enlarged in keeping with changes of pulmonary arterial hypertension. Mild atherosclerotic calcification within the thoracic aorta. No aortic aneurysm. 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 Visualized thyroid is unremarkable. No pathologic thoracic adenopathy. The esophagus is unremarkable. Lungs/Pleura: Rounded density seen within the right apex on previously performed chest radiograph corresponds to a lobulated mass within the right upper lobe measuring 2.5 x 3.9 cm at axial image # 33/3 suspicious for a primary bronchogenic neoplasm. This abuts the visceral pleura but does not appear to a retract or transgress the structure. The lungs are otherwise clear. No pneumothorax or pleural effusion. Central airways are widely patent. Musculoskeletal: No acute bone abnormality. No lytic or blastic bone lesion. CT ABDOMEN AND PELVIS FINDINGS Hepatobiliary: Choose 2 Pancreas: Unremarkable Spleen: Unremarkable Adrenals/Urinary Tract: The adrenal glands are unremarkable. The kidneys are normal in position. There is marked progressive atrophy of the left kidney right kidney is normal in size. Punctate 1-2 mm calculi within the left kidney likely represent nonobstructing urinary calculi. No hydronephrosis. No ureteral calculi. Multiple simple cortical cysts are seen within the right kidney measuring up to 4.9 cm. No follow-up imaging is recommended for these lesions. Mild nonspecific perinephric inflammatory stranding noted. No perinephric fluid collections are identified. The bladder is unremarkable. Stomach/Bowel: Severe descending and sigmoid colonic diverticulosis without  superimposed acute inflammatory change. The stomach, small bowel, and large bowel are otherwise unremarkable. No evidence of obstruction or focal inflammation. Appendix normal. No free intraperitoneal gas or fluid. Vascular/Lymphatic: Fusiform infrarenal abdominal aortic aneurysm is present measuring 5.6 cm in diameter (previously measuring 4.0 cm when measured in similar fashion). No periaortic inflammatory stranding or fluid collections are identified. Superimposed extensive aortoiliac atherosclerotic calcification. Prominent atherosclerotic calcification noted within the mid superior mesenteric artery, however, the degree of stenosis is not well assessed on this noncontrast examination. No pathologic adenopathy within the abdomen and pelvis. Reproductive: No mass or  other significant abnormality. Musculoskeletal: Osseous structures are age-appropriate. No acute bone abnormality. No lytic or blastic bone lesion. Other: No abdominal wall hernia. IMPRESSION: 1. No acute intracranial abnormality. No calvarial fracture. 2. Stable global ventriculomegaly. The degree of ventricular dilation, however, appears disproportionate to the degree of cortical atrophy and changes of normal pressure hydrocephalus are not excluded. Correlation with neurologic examination is recommended. 3. 3.9 cm lobulated mass within the right upper lobe suspicious for a primary bronchogenic neoplasm, new since prior examination. No evidence of metastatic disease within the chest, abdomen, and pelvis. PET CT examination is recommended for further evaluation. 4. Interval transcatheter aortic valve replacement. Mild coronary artery calcification. 5. Morphologic changes in keeping with pulmonary arterial hypertension. 6. Progressive, severe atrophy of the left kidney. Minimal left nonobstructing nephrolithiasis. No urolithiasis. No hydronephrosis. 7. Severe distal colonic diverticulosis without superimposed acute inflammatory change. 8. 5.6 cm  fusiform infrarenal abdominal aortic aneurysm. No periaortic inflammatory stranding or fluid collections are identified. Recommend referral to a vascular specialist. This recommendation follows ACR consensus guidelines: White Paper of the ACR Incidental Findings Committee II on Vascular Findings. J Am Coll Radiol 2013; 10:789-794. 9.  Aortic Atherosclerosis (ICD10-I70.0). Electronically Signed   By: Fidela Salisbury M.D.   On: 06/10/2022 19:45   CT Renal Stone Study  Result Date: 06/10/2022 CLINICAL DATA:  Head trauma, minor (Age >= 65y); Abdominal/flank pain, stone suspected; Lung nodule, > 41m. Mid to lower abdominal pain. EXAM: CT HEAD WITHOUT CONTRAST CT CHEST, ABDOMEN AND PELVIS WITHOUT CONTRAST TECHNIQUE: Contiguous axial images were obtained from the base of the skull through the vertex without intravenous contrast. Multidetector CT imaging of the chest, abdomen and pelvis was performed following the standard protocol without IV contrast. RADIATION DOSE REDUCTION: This exam was performed according to the departmental dose-optimization program which includes automated exposure control, adjustment of the mA and/or kV according to patient size and/or use of iterative reconstruction technique. COMPARISON:  CT head 05/30/2022, CT chest 05/22/2015, CT abdomen pelvis 12/02/2016 FINDINGS: CT HEAD FINDINGS Brain: Normal anatomic configuration of the brain. Parenchymal volume loss is present, commensurate with the patient's age. There is moderate global ventriculomegaly which appears stable since prior examinations dating as far back as 12/02/2016. The degree of ventricular dilation, however, appears disproportionate to the degree cortical atrophy and changes of normal pressure hydrocephalus are not excluded. No evidence of acute intracranial hemorrhage or infarct. No abnormal mass effect or midline shift. No abnormal intra or extra-axial mass lesion. Cerebellum is unremarkable. Vascular: Moderate atherosclerotic  calcification within the carotid siphons. No asymmetric hyperdense vasculature at the skull base. Skull: Normal. Negative for fracture or focal lesion. Sinuses/Orbits: Paranasal sinuses are clear. Ocular lenses have been removed. Orbits are otherwise unremarkable. Other: Mastoid air cells and middle ear cavities are clear. Stable 8 mm nodule within the left frontal scalp, nonspecific, possibly representing a sebaceous or epidermoid cyst. CT CHEST FINDINGS Cardiovascular: Interval transcatheter aortic valve replacement. Extensive calcification of the mitral valve annulus. Mild coronary artery calcification. Global cardiac size within normal limits. No pericardial effusion. Central pulmonary arteries are stably enlarged in keeping with changes of pulmonary arterial hypertension. Mild atherosclerotic calcification within the thoracic aorta. No aortic aneurysm. 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 Visualized thyroid is unremarkable. No pathologic thoracic adenopathy. The esophagus is unremarkable. Lungs/Pleura: Rounded  density seen within the right apex on previously performed chest radiograph corresponds to a lobulated mass within the right upper lobe measuring 2.5 x 3.9 cm at axial image # 33/3 suspicious for a primary bronchogenic neoplasm. This abuts the visceral pleura but does not appear to a retract or transgress the structure. The lungs are otherwise clear. No pneumothorax or pleural effusion. Central airways are widely patent. Musculoskeletal: No acute bone abnormality. No lytic or blastic bone lesion. CT ABDOMEN AND PELVIS FINDINGS Hepatobiliary: Choose 2 Pancreas: Unremarkable Spleen: Unremarkable Adrenals/Urinary Tract: The adrenal glands are unremarkable.  The kidneys are normal in position. There is marked progressive atrophy of the left kidney right kidney is normal in size. Punctate 1-2 mm calculi within the left kidney likely represent nonobstructing urinary calculi. No hydronephrosis. No ureteral calculi. Multiple simple cortical cysts are seen within the right kidney measuring up to 4.9 cm. No follow-up imaging is recommended for these lesions. Mild nonspecific perinephric inflammatory stranding noted. No perinephric fluid collections are identified. The bladder is unremarkable. Stomach/Bowel: Severe descending and sigmoid colonic diverticulosis without superimposed acute inflammatory change. The stomach, small bowel, and large bowel are otherwise unremarkable. No evidence of obstruction or focal inflammation. Appendix normal. No free intraperitoneal gas or fluid. Vascular/Lymphatic: Fusiform infrarenal abdominal aortic aneurysm is present measuring 5.6 cm in diameter (previously measuring 4.0 cm when measured in similar fashion). No periaortic inflammatory stranding or fluid collections are identified. Superimposed extensive aortoiliac atherosclerotic calcification. Prominent atherosclerotic calcification noted within the mid superior mesenteric artery, however, the degree of stenosis is not well assessed on this noncontrast examination. No pathologic adenopathy within the abdomen and pelvis. Reproductive: No mass or other significant abnormality. Musculoskeletal: Osseous structures are age-appropriate. No acute bone abnormality. No lytic or blastic bone lesion. Other: No abdominal wall hernia. IMPRESSION: 1. No acute intracranial abnormality. No calvarial fracture. 2. Stable global ventriculomegaly. The degree of ventricular dilation, however, appears disproportionate to the degree of cortical atrophy and changes of normal pressure hydrocephalus are not excluded. Correlation with neurologic examination is recommended. 3. 3.9 cm lobulated mass within the right  upper lobe suspicious for a primary bronchogenic neoplasm, new since prior examination. No evidence of metastatic disease within the chest, abdomen, and pelvis. PET CT examination is recommended for further evaluation. 4. Interval transcatheter aortic valve replacement. Mild coronary artery calcification. 5. Morphologic changes in keeping with pulmonary arterial hypertension. 6. Progressive, severe atrophy of the left kidney. Minimal left nonobstructing nephrolithiasis. No urolithiasis. No hydronephrosis. 7. Severe distal colonic diverticulosis without superimposed acute inflammatory change. 8. 5.6 cm fusiform infrarenal abdominal aortic aneurysm. No periaortic inflammatory stranding or fluid collections are identified. Recommend referral to a vascular specialist. This recommendation follows ACR consensus guidelines: White Paper of the ACR Incidental Findings Committee II on Vascular Findings. J Am Coll Radiol 2013; 10:789-794. 9.  Aortic Atherosclerosis (ICD10-I70.0). Electronically Signed   By: Fidela Salisbury M.D.   On: 06/10/2022 19:45   CT Chest Wo Contrast  Result Date: 06/10/2022 CLINICAL DATA:  Head trauma, minor (Age >= 65y); Abdominal/flank pain, stone suspected; Lung nodule, > 32m. Mid to lower abdominal pain. EXAM: CT HEAD WITHOUT CONTRAST CT CHEST, ABDOMEN AND PELVIS WITHOUT CONTRAST TECHNIQUE: Contiguous axial images were obtained from the base of the skull through the vertex without intravenous contrast. Multidetector CT imaging of the chest, abdomen and pelvis was performed following the standard protocol without IV contrast. RADIATION DOSE REDUCTION: This exam was performed according to the departmental dose-optimization program which includes automated exposure control, adjustment  of the mA and/or kV according to patient size and/or use of iterative reconstruction technique. COMPARISON:  CT head 05/30/2022, CT chest 05/22/2015, CT abdomen pelvis 12/02/2016 FINDINGS: CT HEAD FINDINGS Brain:  Normal anatomic configuration of the brain. Parenchymal volume loss is present, commensurate with the patient's age. There is moderate global ventriculomegaly which appears stable since prior examinations dating as far back as 12/02/2016. The degree of ventricular dilation, however, appears disproportionate to the degree cortical atrophy and changes of normal pressure hydrocephalus are not excluded. No evidence of acute intracranial hemorrhage or infarct. No abnormal mass effect or midline shift. No abnormal intra or extra-axial mass lesion. Cerebellum is unremarkable. Vascular: Moderate atherosclerotic calcification within the carotid siphons. No asymmetric hyperdense vasculature at the skull base. Skull: Normal. Negative for fracture or focal lesion. Sinuses/Orbits: Paranasal sinuses are clear. Ocular lenses have been removed. Orbits are otherwise unremarkable. Other: Mastoid air cells and middle ear cavities are clear. Stable 8 mm nodule within the left frontal scalp, nonspecific, possibly representing a sebaceous or epidermoid cyst. CT CHEST FINDINGS Cardiovascular: Interval transcatheter aortic valve replacement. Extensive calcification of the mitral valve annulus. Mild coronary artery calcification. Global cardiac size within normal limits. No pericardial effusion. Central pulmonary arteries are stably enlarged in keeping with changes of pulmonary arterial hypertension. Mild atherosclerotic calcification within the thoracic aorta. No aortic aneurysm. 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 Visualized thyroid is unremarkable. No pathologic thoracic adenopathy. The esophagus is unremarkable. Lungs/Pleura: Rounded density seen within the right apex on previously  performed chest radiograph corresponds to a lobulated mass within the right upper lobe measuring 2.5 x 3.9 cm at axial image # 33/3 suspicious for a primary bronchogenic neoplasm. This abuts the visceral pleura but does not appear to a retract or transgress the structure. The lungs are otherwise clear. No pneumothorax or pleural effusion. Central airways are widely patent. Musculoskeletal: No acute bone abnormality. No lytic or blastic bone lesion. CT ABDOMEN AND PELVIS FINDINGS Hepatobiliary: Choose 2 Pancreas: Unremarkable Spleen: Unremarkable Adrenals/Urinary Tract: The adrenal glands are unremarkable. The kidneys are normal in position. There is marked progressive atrophy of the left kidney right kidney is normal in size. Punctate 1-2 mm calculi within the left kidney likely represent nonobstructing urinary calculi. No hydronephrosis. No ureteral calculi. Multiple simple cortical cysts are seen within the right kidney measuring up to 4.9 cm. No follow-up imaging is recommended for these lesions. Mild nonspecific perinephric inflammatory stranding noted. No perinephric fluid collections are identified. The bladder is unremarkable. Stomach/Bowel: Severe descending and sigmoid colonic diverticulosis without superimposed acute inflammatory change. The stomach, small bowel, and large bowel are otherwise unremarkable. No evidence of obstruction or focal inflammation. Appendix normal. No free intraperitoneal gas or fluid. Vascular/Lymphatic: Fusiform infrarenal abdominal aortic aneurysm is present measuring 5.6 cm in diameter (previously measuring 4.0 cm when measured in similar fashion). No periaortic inflammatory stranding or fluid collections are identified. Superimposed extensive aortoiliac atherosclerotic calcification. Prominent atherosclerotic calcification noted within the mid superior mesenteric artery, however, the degree of stenosis is not well assessed on this noncontrast examination. No pathologic  adenopathy within the abdomen and pelvis. Reproductive: No mass or other significant abnormality. Musculoskeletal: Osseous structures are age-appropriate. No acute bone abnormality. No lytic or blastic bone lesion. Other: No abdominal wall hernia. IMPRESSION: 1. No acute intracranial abnormality.  No calvarial fracture. 2. Stable global ventriculomegaly. The degree of ventricular dilation, however, appears disproportionate to the degree of cortical atrophy and changes of normal pressure hydrocephalus are not excluded. Correlation with neurologic examination is recommended. 3. 3.9 cm lobulated mass within the right upper lobe suspicious for a primary bronchogenic neoplasm, new since prior examination. No evidence of metastatic disease within the chest, abdomen, and pelvis. PET CT examination is recommended for further evaluation. 4. Interval transcatheter aortic valve replacement. Mild coronary artery calcification. 5. Morphologic changes in keeping with pulmonary arterial hypertension. 6. Progressive, severe atrophy of the left kidney. Minimal left nonobstructing nephrolithiasis. No urolithiasis. No hydronephrosis. 7. Severe distal colonic diverticulosis without superimposed acute inflammatory change. 8. 5.6 cm fusiform infrarenal abdominal aortic aneurysm. No periaortic inflammatory stranding or fluid collections are identified. Recommend referral to a vascular specialist. This recommendation follows ACR consensus guidelines: White Paper of the ACR Incidental Findings Committee II on Vascular Findings. J Am Coll Radiol 2013; 10:789-794. 9.  Aortic Atherosclerosis (ICD10-I70.0). Electronically Signed   By: Fidela Salisbury M.D.   On: 06/10/2022 19:45   DG Chest Port 1 View  Result Date: 06/10/2022 CLINICAL DATA:  Possible sepsis EXAM: PORTABLE CHEST 1 VIEW COMPARISON:  05/30/2022 FINDINGS: Stable heart size status post TAVR. Aortic atherosclerosis. Focal nodular density at the medial aspect of the right upper lobe  measuring approximately 4.0 cm in diameter. Mildly increased perihilar and bibasilar interstitial markings. No appreciable pleural fluid collection. No pneumothorax. IMPRESSION: 1. Focal nodular density at the medial aspect of the right upper lobe measuring approximately 4.0 cm in diameter. CT of the chest is recommended for further evaluation. 2. Mildly increased perihilar and bibasilar interstitial markings, which may reflect bronchitic lung changes versus mild interstitial edema. Electronically Signed   By: Davina Poke D.O.   On: 06/10/2022 18:41     Marzetta Board, MD, PhD [facility]  Between 7 am - 7 pm I am available, please contact me via Amion (for emergencies) or Securechat (non urgent messages)  Between 7 pm - 7 am I am not available, please contact night coverage MD/APP via Amion

## 2022-06-11 NOTE — ED Notes (Signed)
Family members at bedside

## 2022-06-11 NOTE — ED Notes (Signed)
Report given to cpod RN Jaskaran.

## 2022-06-11 NOTE — Progress Notes (Signed)
Pharmacy Antibiotic Note  [name] [name] is a 88 y.o. male admitted on 06/10/2022 with sepsis.  Pharmacy has been consulted for Vancomycin dosing.  Plan: Pt given Vancomycin 1000 mg once. Vancomycin 1000 mg IV Q 24 hrs. Goal AUC 400-550. Expected AUC: 493.6 SCr used: 1.91  Pharmacy will continue to follow and will adjust abx dosing whenever warranted.  Temp (24hrs), Avg:98.9 F (37.2 C), Min:98.8 F (37.1 C), Max:99 F (37.2 C)   Recent Labs  Lab 06/10/22 1815 06/10/22 2300  WBC 28.2*  --   CREATININE 1.91*  --   LATICACIDVEN  --  1.7    Estimated Creatinine Clearance: 32.9 mL/min (A) (by C-G formula based on SCr of 1.91 mg/dL (H)).    Allergies  Allergen Reactions   Iohexol Hives and Swelling    Pt reports swelling, redness, hives, and blisters    Atorvastatin     REACTION: aches   Celecoxib     REACTION: rash   Clinoril [Sulindac]     REACTION: rash   Nitrofurantoin    Spiriva Handihaler [Tiotropium Bromide Monohydrate] Other (See Comments)    Dry mouth   Sulfa Antibiotics     Intolerant but unrecalled.     Antimicrobials this admission: 12/26 Cefepime >> x 1 dose 12/27 Ceftriaxone >> 06/17/22 12/26 Vancomycin >>  12/26 Flagyl >> x 1 dose  Microbiology results: 12/26 BCx: Pending 12/26 UCx: Pending   Thank you for allowing pharmacy to be a part of this patient's care.  Renda Rolls, PharmD, [facility] 06/11/2022 12:19 AM

## 2022-06-11 NOTE — Progress Notes (Signed)
       CROSS COVER NOTE  NAME: [name] [name] MRN: [id number] DOB : August 05, 1933 ATTENDING PHYSICIAN: Caren Griffins, MD    Date of Service   06/11/2022   HPI/Events of Note   OSA, home CPAP  Interventions   Assessment/Plan: qHS CPAP X X        To reach the provider On-Call:   7AM- 7PM see care teams to locate the attending and reach out to them via [email or web address]. 7PM-7AM contact night-coverage If you still have difficulty reaching the appropriate provider, please page the [facility] (Director on Call) for [facility] on amion for assistance  This document was prepared using Set designer software and may include unintentional dictation errors.  Neomia Glass DNP, MBA, FNP-BC Nurse Practitioner [facility] Pager [phone]

## 2022-06-12 ENCOUNTER — Encounter: Payer: Self-pay | Admitting: Internal Medicine

## 2022-06-12 DIAGNOSIS — A419 Sepsis, unspecified organism: Secondary | ICD-10-CM | POA: Diagnosis not present

## 2022-06-12 DIAGNOSIS — R6520 Severe sepsis without septic shock: Secondary | ICD-10-CM | POA: Diagnosis not present

## 2022-06-12 LAB — COMPREHENSIVE METABOLIC PANEL
ALT: 41 U/L (ref 0–44)
AST: 46 U/L — ABNORMAL HIGH (ref 15–41)
Albumin: 2.1 g/dL — ABNORMAL LOW (ref 3.5–5.0)
Alkaline Phosphatase: 179 U/L — ABNORMAL HIGH (ref 38–126)
Anion gap: 12 (ref 5–15)
BUN: 43 mg/dL — ABNORMAL HIGH (ref 8–23)
CO2: 28 mmol/L (ref 22–32)
Calcium: 8 mg/dL — ABNORMAL LOW (ref 8.9–10.3)
Chloride: 98 mmol/L (ref 98–111)
Creatinine, Ser: 1.55 mg/dL — ABNORMAL HIGH (ref 0.61–1.24)
GFR, Estimated: 43 mL/min — ABNORMAL LOW (ref >60)
Glucose, Bld: 88 mg/dL (ref 70–99)
Potassium: 4.1 mmol/L (ref 3.5–5.1)
Sodium: 138 mmol/L (ref 135–145)
Total Bilirubin: 0.6 mg/dL (ref 0.3–1.2)
Total Protein: 5.5 g/dL — ABNORMAL LOW (ref 6.5–8.1)

## 2022-06-12 LAB — CBC
HCT: 28.3 % — ABNORMAL LOW (ref 39.0–52.0)
Hemoglobin: 8.4 g/dL — ABNORMAL LOW (ref 13.0–17.0)
MCH: 29.1 pg (ref 26.0–34.0)
MCHC: 29.7 g/dL — ABNORMAL LOW (ref 30.0–36.0)
MCV: 97.9 fL (ref 80.0–100.0)
Platelets: 233 10*3/uL (ref 150–400)
RBC: 2.89 MIL/uL — ABNORMAL LOW (ref 4.22–5.81)
RDW: 14.6 % (ref 11.5–15.5)
WBC: 16.7 10*3/uL — ABNORMAL HIGH (ref 4.0–10.5)
nRBC: 0 % (ref 0.0–0.2)

## 2022-06-12 LAB — MAGNESIUM: Magnesium: 2 mg/dL (ref 1.7–2.4)

## 2022-06-12 MED ORDER — ORAL CARE MOUTH RINSE: 15.0000 mL | OROMUCOSAL | Status: DC | PRN

## 2022-06-12 MED ORDER — INFLUENZA VAC A&B SA ADJ QUAD 0.5 ML IM PRSY
0.5000 mL | PREFILLED_SYRINGE | INTRAMUSCULAR | Status: DC
  Filled 2022-06-12: qty 0.5

## 2022-06-12 NOTE — Evaluation (Signed)
Physical Therapy Evaluation Patient Details Name: [name] [name] MRN: [id number] DOB: [birth year] Today's Date: 06/12/2022  History of Present Illness  88 y/o male presented to ED on 06/10/22 for weakness and fever. Admitted for severe sepsis 2/2 UTI PMH: COPD, diastolic CHF, AAA, prediabetes, severe aortic stenosis s/p TAVR  Clinical Impression  Patient admitted with the above. PTA, patient lives with wife and recently requiring increasing assistance and inability to stand/ambulate without assistance. Patient presents with weakness, impaired balance, decreased activity tolerance, and poor safety awareness. Requires maxA for bed mobility and maxA to perform sit to stand with RW but unable to come into full upright standing. Discussed benefits of SNF vs home as patient at this functional level will be essentially bed level with only wife assisting at home as patient has no other family members that can consistently assist at discharge. Patient adamant about returning home despite education. Patient will benefit from skilled PT services during acute stay to address listed deficits. Recommend SNF at discharge to maximize functional mobility and decrease burden of care. If family refuses, patient will need HHPT, HH aide, hospital bed, hoyer lift, and medical transport home as patient is unable to transfer safely into car.         Recommendations for follow up therapy are one component of a multi-disciplinary discharge planning process, led by the attending physician.  Recommendations may be updated based on patient status, additional functional criteria and insurance authorization.  Follow Up Recommendations Skilled nursing-short term rehab (<3 hours/day) (family refusing SNF and wants to return home) Can patient physically be transported by private vehicle: No    Assistance Recommended at Discharge Frequent or constant Supervision/Assistance  Patient can return home with the following  Two people to  help with walking and/or transfers;Two people to help with bathing/dressing/bathroom;Assistance with cooking/housework;Assist for transportation;Help with stairs or ramp for entrance    Equipment Recommendations Hospital bed  Recommendations for Other Services       Functional Status Assessment Patient has had a recent decline in their functional status and demonstrates the ability to make significant improvements in function in a reasonable and predictable amount of time.     Precautions / Restrictions Precautions Precautions: Fall Precaution Comments: 5L O2 at baseline Restrictions Weight Bearing Restrictions: No      Mobility  Bed Mobility Overal bed mobility: Needs Assistance Bed Mobility: Rolling, Supine to Sit, Sit to Supine Rolling: Max assist   Supine to sit: Max assist, HOB elevated Sit to supine: Max assist, HOB elevated   General bed mobility comments: assist for all aspects of bed mobility. Patient requesting HOB elevated    Transfers Overall transfer level: Needs assistance Equipment used: Rolling Lashawnda Hancox (2 wheels) Transfers: Sit to/from Stand Sit to Stand: Max assist, From elevated surface           General transfer comment: maxA from elevated surface to stand but unable to achieve fully upright despite assist    Ambulation/Gait               General Gait Details: unable  Stairs            Wheelchair Mobility    Modified Rankin (Stroke Patients Only)       Balance Overall balance assessment: Needs assistance Sitting-balance support: Bilateral upper extremity supported, Feet supported Sitting balance-Leahy Scale: Fair     Standing balance support: Bilateral upper extremity supported, Reliant on assistive device for balance Standing balance-Leahy Scale: Poor  Pertinent Vitals/Pain Pain Assessment Pain Assessment: Faces Faces Pain Scale: Hurts even more Pain Location: R foot Pain  Descriptors / Indicators: Discomfort, Grimacing, Guarding Pain Intervention(s): Monitored during session, Repositioned, Limited activity within patient's tolerance    Home Living Family/patient expects to be discharged to:: Private residence Living Arrangements: Spouse/significant other Available Help at Discharge: Family Type of Home: House Home Access: Ramped entrance       Home Layout: One level Home Equipment: Agricultural consultant (2 wheels);Cane - single point;BSC/3in1;Rollator (4 wheels);Transport chair      Prior Function Prior Level of Function : Needs assist             Mobility Comments: requiring increasing assistance from wife and family. Unable to stand at home from lift chair. ADLs Comments: sponges bathes at baseline but recently unable to complete due to weakness     Hand Dominance        Extremity/Trunk Assessment   Upper Extremity Assessment Upper Extremity Assessment: Generalized weakness    Lower Extremity Assessment Lower Extremity Assessment: Generalized weakness    Cervical / Trunk Assessment Cervical / Trunk Assessment: Kyphotic  Communication   Communication: No difficulties  Cognition Arousal/Alertness: Awake/alert Behavior During Therapy: WFL for tasks assessed/performed Overall Cognitive Status: Within Functional Limits for tasks assessed                                 General Comments: poor safety awareness with inability to recognize need for +2 assistance at home but wife will be only family member present 90% of time        General Comments General comments (skin integrity, edema, etc.): VSS on 5L O2    Exercises     Assessment/Plan    PT Assessment Patient needs continued PT services  PT Problem List Decreased strength;Decreased activity tolerance;Decreased balance;Decreased mobility;Decreased coordination;Decreased knowledge of use of DME;Decreased safety awareness;Cardiopulmonary status limiting activity        PT Treatment Interventions DME instruction;Gait training;Functional mobility training;Therapeutic activities;Therapeutic exercise;Balance training;Patient/family education    PT Goals (Current goals can be found in the Care Plan section)  Acute Rehab PT Goals Patient Stated Goal: to go home PT Goal Formulation: With patient/family Time For Goal Achievement: 06/26/22 Potential to Achieve Goals: Fair    Frequency Min 2X/week     Co-evaluation               AM-PAC PT "6 Clicks" Mobility  Outcome Measure Help needed turning from your back to your side while in a flat bed without using bedrails?: A Lot Help needed moving from lying on your back to sitting on the side of a flat bed without using bedrails?: A Lot Help needed moving to and from a bed to a chair (including a wheelchair)?: Total Help needed standing up from a chair using your arms (e.g., wheelchair or bedside chair)?: A Lot Help needed to walk in hospital room?: Total Help needed climbing 3-5 steps with a railing? : Total 6 Click Score: 9    End of Session Equipment Utilized During Treatment: Gait belt;Oxygen Activity Tolerance: Patient tolerated treatment well Patient left: in bed;with call bell/phone within reach;with family/visitor present Nurse Communication: Mobility status PT Visit Diagnosis: Muscle weakness (generalized) (M62.81);Unsteadiness on feet (R26.81);Difficulty in walking, not elsewhere classified (R26.2)    Time: 1914-7829 PT Time Calculation (min) (ACUTE ONLY): 39 min   Charges:   PT Evaluation $PT Eval Moderate Complexity: 1 Mod PT  Treatments $Therapeutic Activity: 23-37 mins        Braeden Kennan A. Dan Humphreys PT, DPT [facility] - Acute Rehabilitation Services   Aayana Reinertsen A Tysin Salada 06/12/2022, 12:56 PM

## 2022-06-12 NOTE — ED Notes (Signed)
Pt taken off BiPap and placed on 5L [place].

## 2022-06-12 NOTE — TOC CM/SW Note (Signed)
    Durable Medical Equipment  (From admission, onward)           Start     Ordered   06/12/22 1425  For home use only DME Other see comment  Once       Comments: Harrel Lemon lift  Question:  Length of Need  Answer:  6 Months   06/12/22 1424   06/12/22 1131  For home use only DME Hospital bed  Once       Question Answer Comment  Length of Need 6 Months   The above medical condition requires: Patient requires the ability to reposition frequently   Head must be elevated greater than: 30 degrees   Bed type Semi-electric      06/12/22 1131

## 2022-06-12 NOTE — TOC Initial Note (Addendum)
Transition of Care [facility]) - Initial/Assessment Note    Patient Details  Name: [name] [name] MRN: [id number] Date of Birth: July 04, 1933  Transition of Care [facility]) CM/SW Contact:    Candie Chroman, LCSW Phone Number: 06/12/2022, 2:29 PM  Clinical Narrative:  CSW met with patient. Wife at bedside. CSW introduced role and explained that PT recommendations would be discussed. They confirmed they are not interested in SNF placement. Patient is currently active with [facility] for PT and RN and would like to resume services with them. They do not have an aide available by can add OT. DME recommendations for a hospital bed and hoyer lift. They are agreeable. Ordered through [employer]. Patient is already on 5 L chronic oxygen through [place]. Patient will need EMS transport home. Address on facesheet is correct. No further concerns. CSW encouraged patient and his wife to contact CSW as needed. CSW will continue to follow patient for support and facilitate return home once stable.               Expected Discharge Plan: [place] Barriers to Discharge: Continued Medical Work up   Patient Goals and CMS Choice     Choice offered to / list presented to : Patient, Spouse      Expected Discharge Plan and Services     Post Acute Care Choice: Durable Medical Equipment, Resumption of Svcs/PTA Provider Living arrangements for the past 2 months: [place]                 DME Arranged: Hospital bed, Other see comment Harrel Lemon lift) DME Agency: [employer] Date DME Agency Contacted: 06/12/22   Representative spoke with at DME Agency: Suanne Marker HH Arranged: RN, PT, OT [facility] Agency: [place] Date [place]: 06/12/22   Representative spoke with at [place]: Bobbe Medico  Prior Living Arrangements/Services Living arrangements for the past 2 months: [place] with:: Spouse Patient language and need for interpreter reviewed:: Yes Do you feel  safe going back to the place where you live?: Yes      Need for Family Participation in Patient Care: Yes (Comment) Care giver support system in place?: Yes (comment) Current home services: DME, Home PT, Home RN Criminal Activity/Legal Involvement Pertinent to Current Situation/Hospitalization: No - Comment as needed  Activities of Daily Living      Permission Sought/Granted Permission sought to share information with : Facility Sport and exercise psychologist, Family Supports Permission granted to share information with : Yes, Verbal Permission Granted  Share Information with NAME: [name] [name]  Permission granted to share info w AGENCY: [place] granted to share info w Relationship: Spouse  Permission granted to share info w Contact Information: [phone]  Emotional Assessment Appearance:: Appears stated age Attitude/Demeanor/Rapport: Engaged, Gracious Affect (typically observed): Accepting, Appropriate, Calm, Pleasant Orientation: : Oriented to Self, Oriented to Place, Oriented to  Time, Oriented to Situation Alcohol / Substance Use: Not Applicable Psych Involvement: No (comment)  Admission diagnosis:  Severe sepsis with acute organ dysfunction ([place]) [A41.9, R65.20] Patient Active Problem List   Diagnosis Date Noted   Severe sepsis with acute organ dysfunction ([place]) 06/10/2022   Respiratory failure with hypercapnia ([place]) 07/27/1550   Acute metabolic encephalopathy 01/16/2335   Normocytic anemia 02/24/2022   Normochromic anemia 02/12/2022   Scalp lesion 02/12/2022   Constipation 02/12/2022   Skin inflammation 12/29/2021   Rash 06/17/2021   Pressure sore 06/17/2021   Olecranon bursitis 06/17/2021  Valvular heart disease 04/19/2021   Venous stasis ulcer ([place]) 03/13/2021   Heart block 01/17/2021   Blurry vision 11/07/2019   Healthcare maintenance 11/07/2019   Insomnia 04/01/2018   Myalgia 12/03/2016   Vitamin D deficiency 11/21/2016   Chronic heart  failure with preserved ejection fraction (HFpEF) ([place]) 07/13/2016   AAA (abdominal aortic aneurysm) without rupture ([place]) 07/13/2016   COPD with acute exacerbation ([place]) 12/27/2015   Fecal urgency 09/14/2015   S/P TAVR (transcatheter aortic valve replacement) 06/26/2015   Severe aortic stenosis 05/15/2015   Medicare annual wellness visit, subsequent 05/21/2014   Advance care planning 05/21/2014   Skin irritation 12/20/2013   Risk for falls 12/20/2013   Rib pain on right side 01/29/2013   Vertigo 07/01/2012   Right knee DJD 10/31/2011   Knee pain 10/23/2011   OSA (obstructive sleep apnea) 07/31/2009   Chronic respiratory failure with hypoxia ([place]) 08/03/2008   Morbid obesity ([place]) 02/22/2008   POLYP, COLON 08/27/2007   Anxiety state 08/27/2007   COPD (chronic obstructive pulmonary disease) with emphysema ([place]) 08/27/2007   INTERNAL HEMORRHOIDS 03/23/2007   DIVERTICULOSIS, COLON 03/23/2007   SYMPTOM, PAIN, ABDOMINAL, GENERALIZED 02/18/2007   HYPERGLYCEMIA 02/18/2007   Essential hypertension 02/10/2007   NEOPLASM, SKIN, UNCERTAIN BEHAVIOR 25/00/3704   PEPTIC ULCER DISEASE WITH H-PYLORI  TX'D 11/19/2006   NEPHROLITHIASIS 11/19/2006   Backache 11/19/2006   BPH (benign prostatic hyperplasia) 11/19/2006   PCP:  Tonia Ghent, MD Pharmacy:   [id number]: [phone]: [phone]    Social Determinants of Health (SDOH) Social History: SDOH Screenings   Food Insecurity: No Food Insecurity (06/03/2022)  Housing: Low Risk  (06/03/2022)  Transportation Needs: No Transportation Needs (06/03/2022)  Utilities: Not At Risk (05/31/2022)  Alcohol Screen: Low Risk  (12/16/2021)  Depression (PHQ2-9): Low Risk  (12/16/2021)  Financial Resource Strain: Low Risk  (12/16/2021)  Physical Activity: Sufficiently Active (12/16/2021)  Social Connections: Socially Integrated (12/16/2021)  Stress: No Stress  Concern Present (11/02/2019)  Tobacco Use: Medium Risk (06/10/2022)   SDOH Interventions:     Readmission Risk Interventions     No data to display

## 2022-06-12 NOTE — Progress Notes (Signed)
PROGRESS NOTE  [name] [name] RCB:[id number] DOB: 10/13/1933 DOA: 06/10/2022 PCP: Tonia Ghent, MD   LOS: 2 days   Brief Narrative / Interim history: 88 year old male with history of COPD, diastolic CHF, anxiety comes to the hospital with complaints of fever and chills.  He was found to have significant leukocytosis in the ED with a WBC of 28.2, at home he was also reportedly febrile @ 102. Also reported dysuria  Subjective / 24h Interval events: Doing well, comfortable with the CPAP on.  Slept well.  Assesement and Plan: Principal Problem:   Severe sepsis with acute organ dysfunction (HCC) Active Problems:   Risk for falls   S/P TAVR (transcatheter aortic valve replacement)   Principal problem Severe sepsis due to E. coli bacteremia, UTI -met sepsis criteria with fever, tachycardia, elevated WBC and no source.  Continue ceftriaxone, monitor sensitivities.  Focals physiology resolved, he is now afebrile -If he continues to improve, works well with PT today, and upon sensitivities resulted could potentially go home tomorrow  Active problems COPD with chronic hypoxic respiratory failure-on 5 L at home, continue  Acute kidney injury-creatinine 1.9 on admission, continues to improve  Hypokalemia-replete and continue to monitor, potassium normalized this morning  Infrarenal AAA-outpatient follow-up with vascular  Right upper lobe mass-approximately 3.9 cm, needs outpatient follow-up PCP  Acute on chronic anemia -hemoglobin stable, no bleeding  Scheduled Meds:  aspirin EC  81 mg Oral Daily   ferrous sulfate  325 mg Oral Daily   mometasone-formoterol  2 puff Inhalation BID   Continuous Infusions:  cefTRIAXone (ROCEPHIN)  IV Stopped (06/12/22 0940)   PRN Meds:.acetaminophen **OR** acetaminophen, albuterol, ondansetron **OR** ondansetron (ZOFRAN) IV, senna-docusate  Current Outpatient Medications  Medication Instructions   albuterol (PROAIR HFA) 108 (90 Base) MCG/ACT  inhaler INHALE 1-2 PUFFS INTO THE LUNGS EVERY 6 (SIX) HOURS AS NEEDED FOR WHEEZING.   aspirin EC 81 mg, Oral, Daily   cyanocobalamin (VITAMIN B12) 1,000 mcg, Intramuscular, Weekly   furosemide (LASIX) 40 mg, Oral, Daily, PLEASE CALL [phone] TO SCHEDULE AN APPOINTMENT FOR FURTHER REFILLS.   Iron (Ferrous Sulfate) 325 mg, Oral, Daily   LORazepam (ATIVAN) 1 MG tablet TAKE 1/4 TO 1/2 TABLET BY MOUTH EVERY 6 HOURS AS NEEDED   OXYGEN Inhalation, 5 Liters   Respiratory Therapy Supplies (FLUTTER) DEVI Use as directed   SYMBICORT 160-4.5 MCG/ACT inhaler TAKE 2 PUFFS BY MOUTH TWICE A DAY   Wound Dressings (CURITY UNNA BOOT) MISC Dispense 4 rolls of unna boot wrap to use on legs with coban.    Diet Orders (From admission, onward)     Start     Ordered   06/10/22 2117  Diet Heart Room service appropriate? Yes; Fluid consistency: Thin  Diet effective now       Question Answer Comment  Room service appropriate? Yes   Fluid consistency: Thin      06/10/22 2118            DVT prophylaxis: Place TED hose Start: 06/10/22 2116   Lab Results  Component Value Date   PLT 233 06/12/2022      Code Status: Full Code  Family Communication: Wife at bedside  Status is: Inpatient  Remains inpatient appropriate because: severity of illness  Level of care: Progressive  Consultants:  None   Objective: Vitals:   06/12/22 0700 06/12/22 0830 06/12/22 0900 06/12/22 0930  BP: (!) 113/58 117/62 117/63 (!) 132/110  Pulse: 80 85 90 93  Resp: (!) 23 (!) 25 (!)  28 (!) 21  Temp:      TempSrc:      SpO2: 92% 93% 96% 98%  Weight:      Height:        Intake/Output Summary (Last 24 hours) at 06/12/2022 0943 Last data filed at 06/12/2022 0900 Gross per 24 hour  Intake --  Output 1100 ml  Net -1100 ml    Wt Readings from Last 3 Encounters:  06/10/22 99.8 kg  05/31/22 101.1 kg  05/26/22 100.7 kg    Examination:  Constitutional: NAD Eyes: lids and conjunctivae normal, no scleral  icterus ENMT: mmm Neck: normal, supple Respiratory: clear to auscultation bilaterally, no wheezing, no crackles. Normal respiratory effort.  Cardiovascular: Regular rate and rhythm, no murmurs / rubs / gallops. No LE edema. Abdomen: soft, no distention, no tenderness. Bowel sounds positive.  Skin: no rashes Neurologic: no focal deficits, equal strength  Data Reviewed: I have independently reviewed following labs and imaging studies   CBC Recent Labs  Lab 06/10/22 1815 06/11/22 0444 06/12/22 0423  WBC 28.2* 25.2* 16.7*  HGB 8.7* 8.3* 8.4*  HCT 29.4* 27.8* 28.3*  PLT 298 246 233  MCV 99.0 98.9 97.9  MCH 29.3 29.5 29.1  MCHC 29.6* 29.9* 29.7*  RDW 14.7 14.8 14.6  LYMPHSABS 0.2*  --   --   MONOABS 0.9  --   --   EOSABS 0.0  --   --   BASOSABS 0.1  --   --      Recent Labs  Lab 06/10/22 1815 06/10/22 2300 06/11/22 0444 06/12/22 0423  NA 139  --  140 138  K 2.9*  --  3.1* 4.1  CL 96*  --  101 98  CO2 34*  --  31 28  GLUCOSE 115*  --  132* 88  BUN 45*  --  46* 43*  CREATININE 1.91*  --  1.75* 1.55*  CALCIUM 7.9*  --  7.7* 8.0*  AST 150*  --   --  46*  ALT 63*  --   --  41  ALKPHOS 219*  --   --  179*  BILITOT 1.6*  --   --  0.6  ALBUMIN 2.2*  --   --  2.1*  MG 1.7  --   --  2.0  PROCALCITON 8.42  --   --   --   LATICACIDVEN  --  1.7  --   --   INR 1.3*  --   --   --   BNP 69.6  --   --   --      ------------------------------------------------------------------------------------------------------------------ No results for input(s): "CHOL", "HDL", "LDLCALC", "TRIG", "CHOLHDL", "LDLDIRECT" in the last 72 hours.  Lab Results  Component Value Date   HGBA1C 5.3 03/26/2018   ------------------------------------------------------------------------------------------------------------------ No results for input(s): "TSH", "T4TOTAL", "T3FREE", "THYROIDAB" in the last 72 hours.  Invalid input(s): "FREET3"  Cardiac Enzymes No results for input(s): "CKMB",  "TROPONINI", "MYOGLOBIN" in the last 168 hours.  Invalid input(s): "CK" ------------------------------------------------------------------------------------------------------------------    Component Value Date/Time   BNP 69.6 06/10/2022 1815   BNP 18 11/01/2021 1607    CBG: No results for input(s): "GLUCAP" in the last 168 hours.  Recent Results (from the past 240 hour(s))  Resp panel by RT-PCR (RSV, Flu A&B, Covid) Anterior Nasal Swab     Status: None   Collection Time: 06/10/22  6:15 PM   Specimen: Anterior Nasal Swab  Result Value Ref Range Status   SARS Coronavirus 2 by RT  PCR NEGATIVE NEGATIVE Final    Comment: (NOTE) SARS-CoV-2 target nucleic acids are NOT DETECTED.  The SARS-CoV-2 RNA is generally detectable in upper respiratory specimens during the acute phase of infection. The lowest concentration of SARS-CoV-2 viral copies this assay can detect is 138 copies/mL. A negative result does not preclude SARS-Cov-2 infection and should not be used as the sole basis for treatment or other patient management decisions. A negative result may occur with  improper specimen collection/handling, submission of specimen other than nasopharyngeal swab, presence of viral mutation(s) within the areas targeted by this assay, and inadequate number of viral copies(<138 copies/mL). A negative result must be combined with clinical observations, patient history, and epidemiological information. The expected result is Negative.  Fact Sheet for Patients:  EntrepreneurPulse.com.au  Fact Sheet for Healthcare Providers:  IncredibleEmployment.be  This test is no t yet approved or cleared by the [place] FDA and  has been authorized for detection and/or diagnosis of SARS-CoV-2 by FDA under an Emergency Use Authorization (EUA). This EUA will remain  in effect (meaning this test can be used) for the duration of the COVID-19 declaration under Section  564(b)(1) of the Act, 21 U.S.C.section 360bbb-3(b)(1), unless the authorization is terminated  or revoked sooner.       Influenza A by PCR NEGATIVE NEGATIVE Final   Influenza B by PCR NEGATIVE NEGATIVE Final    Comment: (NOTE) The Xpert Xpress SARS-CoV-2/FLU/RSV plus assay is intended as an aid in the diagnosis of influenza from Nasopharyngeal swab specimens and should not be used as a sole basis for treatment. Nasal washings and aspirates are unacceptable for Xpert Xpress SARS-CoV-2/FLU/RSV testing.  Fact Sheet for Patients: EntrepreneurPulse.com.au  Fact Sheet for Healthcare Providers: IncredibleEmployment.be  This test is not yet approved or cleared by the [place] FDA and has been authorized for detection and/or diagnosis of SARS-CoV-2 by FDA under an Emergency Use Authorization (EUA). This EUA will remain in effect (meaning this test can be used) for the duration of the COVID-19 declaration under Section 564(b)(1) of the Act, 21 U.S.C. section 360bbb-3(b)(1), unless the authorization is terminated or revoked.     Resp Syncytial Virus by PCR NEGATIVE NEGATIVE Final    Comment: (NOTE) Fact Sheet for Patients: EntrepreneurPulse.com.au  Fact Sheet for Healthcare Providers: IncredibleEmployment.be  This test is not yet approved or cleared by the [place] FDA and has been authorized for detection and/or diagnosis of SARS-CoV-2 by FDA under an Emergency Use Authorization (EUA). This EUA will remain in effect (meaning this test can be used) for the duration of the COVID-19 declaration under Section 564(b)(1) of the Act, 21 U.S.C. section 360bbb-3(b)(1), unless the authorization is terminated or revoked.  Performed at [facility], [address], [place], [place] [address]   Blood Culture (routine x 2)     Status: Abnormal (Preliminary result)   Collection Time: 06/10/22  6:15 PM    Specimen: BLOOD LEFT ARM  Result Value Ref Range Status   Specimen Description   Final    BLOOD LEFT ARM Performed at [place] [address], [place], [place] [address]    Special Requests   Final    BOTTLES DRAWN AEROBIC AND ANAEROBIC Blood Culture results may not be optimal due to an inadequate volume of blood received in culture bottles Performed at [facility], [address], [place], [place] [address]    Culture  Setup Time   Final    GRAM NEGATIVE RODS IN BOTH AEROBIC  AND ANAEROBIC BOTTLES CRITICAL RESULT CALLED TO, READ BACK BY AND VERIFIED WITH: Pamelia Hoit 06/11/22 0841 MW Performed at [facility], [address] [place], [place] [address]    Culture ESCHERICHIA COLI (A)  Final   Report Status PENDING  Incomplete  Blood Culture (routine x 2)     Status: Abnormal (Preliminary result)   Collection Time: 06/10/22  6:15 PM   Specimen: BLOOD RIGHT ARM  Result Value Ref Range Status   Specimen Description   Final    BLOOD RIGHT ARM Performed at [facility], [place] [address], [place], [place] [address]    Special Requests   Final    BOTTLES DRAWN AEROBIC AND ANAEROBIC Blood Culture results may not be optimal due to an inadequate volume of blood received in culture bottles Performed at [facility], [address], [place], [place] [address]    Culture  Setup Time   Final    GRAM NEGATIVE RODS IN BOTH AEROBIC AND ANAEROBIC BOTTLES CRITICAL RESULT CALLED TO, READ BACK BY AND VERIFIED WITH: Pamelia Hoit 06/11/22 0842 MW    Culture (A)  Final    ESCHERICHIA COLI SUSCEPTIBILITIES TO FOLLOW Performed at [facility], [place] [address] [place], [place] [address]    Report Status PENDING  Incomplete  Urine Culture     Status: Abnormal   Collection Time: 06/10/22  6:15 PM   Specimen: Urine, Random  Result Value Ref Range Status   Specimen Description   Final    URINE, RANDOM Performed at [facility], [address] [place], [place] [address]    Special Requests   Final    NONE Performed at [facility], [address] [place],  [address]    Culture MULTIPLE SPECIES PRESENT, SUGGEST RECOLLECTION (A)  Final   Report Status 06/11/2022 FINAL  Final  Blood Culture ID Panel (Reflexed)     Status: Abnormal   Collection Time: 06/10/22  6:15 PM  Result Value Ref Range Status   Enterococcus faecalis NOT DETECTED NOT DETECTED Final   Enterococcus Faecium NOT DETECTED NOT DETECTED Final   Listeria monocytogenes NOT DETECTED NOT DETECTED Final   Staphylococcus species NOT DETECTED NOT DETECTED Final   Staphylococcus aureus (BCID) NOT DETECTED NOT DETECTED Final   Staphylococcus epidermidis NOT DETECTED NOT DETECTED Final   Staphylococcus lugdunensis NOT DETECTED NOT DETECTED Final   Streptococcus species NOT DETECTED NOT DETECTED Final   Streptococcus agalactiae NOT DETECTED NOT DETECTED Final   Streptococcus pneumoniae NOT DETECTED NOT DETECTED Final   Streptococcus pyogenes NOT DETECTED NOT DETECTED Final   A.calcoaceticus-baumannii NOT DETECTED NOT DETECTED Final   Bacteroides fragilis NOT DETECTED NOT DETECTED Final   Enterobacterales DETECTED (A) NOT DETECTED Final    Comment: Enterobacterales represent a large order of gram negative bacteria, not a single organism. CRITICAL RESULT CALLED TO, READ BACK BY AND VERIFIED WITH: MORGAN GOBBLE 06/11/22 0841 MW    Enterobacter cloacae complex NOT DETECTED NOT DETECTED Final   Escherichia coli DETECTED (A) NOT DETECTED Final    Comment: CRITICAL RESULT CALLED TO, READ BACK BY AND VERIFIED WITH: MORGAN GOBBLE 06/11/22 0841 MW    Klebsiella aerogenes NOT DETECTED NOT DETECTED Final   Klebsiella oxytoca NOT DETECTED NOT DETECTED Final   Klebsiella pneumoniae NOT DETECTED NOT DETECTED Final   Proteus species NOT DETECTED NOT DETECTED Final   Salmonella species NOT DETECTED NOT DETECTED Final   Serratia marcescens NOT DETECTED  NOT DETECTED Final  Haemophilus influenzae NOT DETECTED NOT DETECTED Final   Neisseria meningitidis NOT DETECTED NOT DETECTED Final   Pseudomonas aeruginosa NOT DETECTED NOT DETECTED Final   Stenotrophomonas maltophilia NOT DETECTED NOT DETECTED Final   Candida albicans NOT DETECTED NOT DETECTED Final   Candida auris NOT DETECTED NOT DETECTED Final   Candida glabrata NOT DETECTED NOT DETECTED Final   Candida krusei NOT DETECTED NOT DETECTED Final   Candida parapsilosis NOT DETECTED NOT DETECTED Final   Candida tropicalis NOT DETECTED NOT DETECTED Final   Cryptococcus neoformans/gattii NOT DETECTED NOT DETECTED Final   CTX-M ESBL NOT DETECTED NOT DETECTED Final   Carbapenem resistance IMP NOT DETECTED NOT DETECTED Final   Carbapenem resistance KPC NOT DETECTED NOT DETECTED Final   Carbapenem resistance NDM NOT DETECTED NOT DETECTED Final   Carbapenem resist OXA 48 LIKE NOT DETECTED NOT DETECTED Final   Carbapenem resistance VIM NOT DETECTED NOT DETECTED Final    Comment: Performed at [facility], [address] [place], [place] [address]     Radiology Studies: No results found.   Marzetta Board, MD, PhD [facility]  Between 7 am - 7 pm I am available, please contact me via Amion (for emergencies) or Securechat (non urgent messages)  Between 7 pm - 7 am I am not available, please contact night coverage MD/APP via Amion

## 2022-06-12 NOTE — ED Notes (Signed)
Hospitalist at bedside 

## 2022-06-13 ENCOUNTER — Inpatient Hospital Stay: Payer: PPO | Admitting: Family Medicine

## 2022-06-13 DIAGNOSIS — R6520 Severe sepsis without septic shock: Secondary | ICD-10-CM | POA: Diagnosis not present

## 2022-06-13 DIAGNOSIS — A419 Sepsis, unspecified organism: Secondary | ICD-10-CM | POA: Diagnosis not present

## 2022-06-13 LAB — CBC
HCT: 31.4 % — ABNORMAL LOW (ref 39.0–52.0)
Hemoglobin: 9.4 g/dL — ABNORMAL LOW (ref 13.0–17.0)
MCH: 29.1 pg (ref 26.0–34.0)
MCHC: 29.9 g/dL — ABNORMAL LOW (ref 30.0–36.0)
MCV: 97.2 fL (ref 80.0–100.0)
Platelets: 229 10*3/uL (ref 150–400)
RBC: 3.23 MIL/uL — ABNORMAL LOW (ref 4.22–5.81)
RDW: 14.6 % (ref 11.5–15.5)
WBC: 10 10*3/uL (ref 4.0–10.5)
nRBC: 0 % (ref 0.0–0.2)

## 2022-06-13 LAB — CULTURE, BLOOD (ROUTINE X 2)

## 2022-06-13 LAB — BASIC METABOLIC PANEL
Anion gap: 7 (ref 5–15)
BUN: 42 mg/dL — ABNORMAL HIGH (ref 8–23)
CO2: 34 mmol/L — ABNORMAL HIGH (ref 22–32)
Calcium: 8.7 mg/dL — ABNORMAL LOW (ref 8.9–10.3)
Chloride: 102 mmol/L (ref 98–111)
Creatinine, Ser: 1.38 mg/dL — ABNORMAL HIGH (ref 0.61–1.24)
GFR, Estimated: 50 mL/min — ABNORMAL LOW (ref >60)
Glucose, Bld: 96 mg/dL (ref 70–99)
Potassium: 4.4 mmol/L (ref 3.5–5.1)
Sodium: 143 mmol/L (ref 135–145)

## 2022-06-13 MED ORDER — EPOETIN ALFA-EPBX 40000 UNIT/ML IJ SOLN: 40000.0000 [IU] | Freq: Once | INTRAMUSCULAR | Status: DC

## 2022-06-13 MED ORDER — POLYVINYL ALCOHOL 1.4 % OP SOLN
1.0000 [drp] | OPHTHALMIC | Status: DC | PRN
  Administered 2022-06-13: 1 [drp] via OPHTHALMIC
  Filled 2022-06-13: qty 15

## 2022-06-13 MED ORDER — CEFADROXIL 1 G PO TABS: 1.0000 g | ORAL_TABLET | Freq: Two times a day (BID) | ORAL | 0 refills | Status: AC

## 2022-06-13 MED ORDER — EPOETIN ALFA 40000 UNIT/ML IJ SOLN
40000.0000 [IU] | Freq: Once | INTRAMUSCULAR | Status: DC
  Filled 2022-06-13: qty 1

## 2022-06-13 NOTE — Discharge Summary (Signed)
Physician Discharge Summary  [name] [name] BDZ:[id number] DOB: 1934-01-04 DOA: 06/10/2022  PCP: Tonia Ghent, MD  Admit date: 06/10/2022 Discharge date: 06/13/2022  Admitted From: home Disposition:  home  Recommendations for Outpatient Follow-up:  Follow up with PCP in 1-2 weeks  Home Health: PT, OT Equipment/Devices: hospital bed  Discharge Condition: stable CODE STATUS: Full code  HPI: Per admitting MD, [name] [name] is a 88 year old male with history of anxiety, history of COPD, heart failure with preserved ejection fraction presents emergency department for chief concerns of fever. Initial vitals show temperature of 98.8, respiration rate of 28, heart rate of 110, blood pressure 103/57, SpO2 of 97% on 5 L nasal cannula.  Serum sodium is 139, potassium 2.9, chloride 96, bicarb 34, BUN of 45, serum creatinine of 1.91, EGFR  4, nonfasting blood glucose 115. WBC 28.2, hemoglobin 8.7, platelets of 298.  Hospital Course / Discharge diagnoses: Principal Problem:   Severe sepsis with acute organ dysfunction (HCC) Active Problems:   Risk for falls   S/P TAVR (transcatheter aortic valve replacement)   Principal problem Severe sepsis due to E. coli bacteremia, UTI -met sepsis criteria with fever, tachycardia, elevated WBC and no source.  Patient was started on ceftriaxone.  Sepsis physiology improved, he has become afebrile, and white count is normalized.  His blood cultures speciated E. coli likely due to UTI given dysuria and burning with urination on admission.  After receiving susceptibilities, he will be narrowed to cefadroxil after discussing with pharmacy for 6 additional days for a total of 10-day course.  He will be discharged home in stable condition, advised to follow-up with PCP.  Of note, he refused SNF and home health PT/hospital bed was arranged upon discharge.   Active problems COPD with chronic hypoxic respiratory failure-on 5 L at home, continue Acute kidney  injury-creatinine 1.9 on admission, improved Hypokalemia-replete and continue to monitor, potassium normalized Infrarenal AAA-outpatient follow-up with vascular Right upper lobe mass-approximately 3.9 cm, needs outpatient follow-up PCP Acute on chronic anemia -hemoglobin stable, no bleeding   Discharge Instructions   Allergies as of 06/13/2022       Reactions   Iohexol Hives, Swelling   Pt reports swelling, redness, hives, and blisters    Atorvastatin    REACTION: aches   Celecoxib    REACTION: rash   Clinoril [sulindac]    REACTION: rash   Nitrofurantoin    Spiriva Handihaler [tiotropium Bromide Monohydrate] Other (See Comments)   Dry mouth   Sulfa Antibiotics    Intolerant but unrecalled.         Medication List     STOP taking these medications    LORazepam 1 MG tablet Commonly known as: ATIVAN       TAKE these medications    albuterol 108 (90 Base) MCG/ACT inhaler Commonly known as: ProAir HFA INHALE 1-2 PUFFS INTO THE LUNGS EVERY 6 (SIX) HOURS AS NEEDED FOR WHEEZING.   aspirin EC 81 MG tablet Take 1 tablet (81 mg total) by mouth daily.   cefadroxil 1 g tablet Commonly known as: DURICEF Take 1 tablet (1 g total) by mouth 2 (two) times daily for 6 days.   [employer] Dispense 4 rolls of unna boot wrap to use on legs with coban.   cyanocobalamin 1000 MCG/ML injection Commonly known as: VITAMIN B12 INJECT 1 ML (1,000 MCG TOTAL) INTO THE MUSCLE ONCE A WEEK.   Flutter Devi Use as directed   furosemide 40 MG tablet Commonly known  as: LASIX Take 1 tablet (40 mg total) by mouth daily. PLEASE CALL [phone] TO SCHEDULE AN APPOINTMENT FOR FURTHER REFILLS.   Iron (Ferrous Sulfate) 325 (65 Fe) MG Tabs Take 325 mg by mouth daily.   OXYGEN Inhale into the lungs. 5 Liters   Symbicort 160-4.5 MCG/ACT inhaler Generic drug: budesonide-formoterol TAKE 2 PUFFS BY MOUTH TWICE A DAY               Durable Medical Equipment  (From  admission, onward)           Start     Ordered   06/12/22 1425  For home use only DME Other see comment  Once       Comments: Harrel Lemon lift  Question:  Length of Need  Answer:  6 Months   06/12/22 1424   06/12/22 1131  For home use only DME Hospital bed  Once       Question Answer Comment  Length of Need 6 Months   The above medical condition requires: Patient requires the ability to reposition frequently   Head must be elevated greater than: 30 degrees   Bed type Semi-electric      06/12/22 1131             Consultations: none  Procedures/Studies:  CT HEAD WO CONTRAST (5MM)  Result Date: 06/10/2022 CLINICAL DATA:  Head trauma, minor (Age >= 65y); Abdominal/flank pain, stone suspected; Lung nodule, > 29m. Mid to lower abdominal pain. EXAM: CT HEAD WITHOUT CONTRAST CT CHEST, ABDOMEN AND PELVIS WITHOUT CONTRAST TECHNIQUE: Contiguous axial images were obtained from the base of the skull through the vertex without intravenous contrast. Multidetector CT imaging of the chest, abdomen and pelvis was performed following the standard protocol without IV contrast. RADIATION DOSE REDUCTION: This exam was performed according to the departmental dose-optimization program which includes automated exposure control, adjustment of the mA and/or kV according to patient size and/or use of iterative reconstruction technique. COMPARISON:  CT head 05/30/2022, CT chest 05/22/2015, CT abdomen pelvis 12/02/2016 FINDINGS: CT HEAD FINDINGS Brain: Normal anatomic configuration of the brain. Parenchymal volume loss is present, commensurate with the patient's age. There is moderate global ventriculomegaly which appears stable since prior examinations dating as far back as 12/02/2016. The degree of ventricular dilation, however, appears disproportionate to the degree cortical atrophy and changes of normal pressure hydrocephalus are not excluded. No evidence of acute intracranial hemorrhage or infarct. No abnormal  mass effect or midline shift. No abnormal intra or extra-axial mass lesion. Cerebellum is unremarkable. Vascular: Moderate atherosclerotic calcification within the carotid siphons. No asymmetric hyperdense vasculature at the skull base. Skull: Normal. Negative for fracture or focal lesion. Sinuses/Orbits: Paranasal sinuses are clear. Ocular lenses have been removed. Orbits are otherwise unremarkable. Other: Mastoid air cells and middle ear cavities are clear. Stable 8 mm nodule within the left frontal scalp, nonspecific, possibly representing a sebaceous or epidermoid cyst. CT CHEST FINDINGS Cardiovascular: Interval transcatheter aortic valve replacement. Extensive calcification of the mitral valve annulus. Mild coronary artery calcification. Global cardiac size within normal limits. No pericardial effusion. Central pulmonary arteries are stably enlarged in keeping with changes of pulmonary arterial hypertension. Mild atherosclerotic calcification within the thoracic aorta. No aortic aneurysm. 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 Visualized thyroid is unremarkable. No pathologic thoracic adenopathy. The  esophagus is unremarkable. Lungs/Pleura: Rounded density seen within the right apex on previously performed chest radiograph corresponds to a lobulated mass within the right upper lobe measuring 2.5 x 3.9 cm at axial image # 33/3 suspicious for a primary bronchogenic neoplasm. This abuts the visceral pleura but does not appear to a retract or transgress the structure. The lungs are otherwise clear. No pneumothorax or pleural effusion. Central airways are widely patent. Musculoskeletal: No acute bone abnormality. No lytic or blastic bone lesion. CT ABDOMEN AND PELVIS  FINDINGS Hepatobiliary: Choose 2 Pancreas: Unremarkable Spleen: Unremarkable Adrenals/Urinary Tract: The adrenal glands are unremarkable. The kidneys are normal in position. There is marked progressive atrophy of the left kidney right kidney is normal in size. Punctate 1-2 mm calculi within the left kidney likely represent nonobstructing urinary calculi. No hydronephrosis. No ureteral calculi. Multiple simple cortical cysts are seen within the right kidney measuring up to 4.9 cm. No follow-up imaging is recommended for these lesions. Mild nonspecific perinephric inflammatory stranding noted. No perinephric fluid collections are identified. The bladder is unremarkable. Stomach/Bowel: Severe descending and sigmoid colonic diverticulosis without superimposed acute inflammatory change. The stomach, small bowel, and large bowel are otherwise unremarkable. No evidence of obstruction or focal inflammation. Appendix normal. No free intraperitoneal gas or fluid. Vascular/Lymphatic: Fusiform infrarenal abdominal aortic aneurysm is present measuring 5.6 cm in diameter (previously measuring 4.0 cm when measured in similar fashion). No periaortic inflammatory stranding or fluid collections are identified. Superimposed extensive aortoiliac atherosclerotic calcification. Prominent atherosclerotic calcification noted within the mid superior mesenteric artery, however, the degree of stenosis is not well assessed on this noncontrast examination. No pathologic adenopathy within the abdomen and pelvis. Reproductive: No mass or other significant abnormality. Musculoskeletal: Osseous structures are age-appropriate. No acute bone abnormality. No lytic or blastic bone lesion. Other: No abdominal wall hernia. IMPRESSION: 1. No acute intracranial abnormality. No calvarial fracture. 2. Stable global ventriculomegaly. The degree of ventricular dilation, however, appears disproportionate to the degree of cortical atrophy and changes of normal  pressure hydrocephalus are not excluded. Correlation with neurologic examination is recommended. 3. 3.9 cm lobulated mass within the right upper lobe suspicious for a primary bronchogenic neoplasm, new since prior examination. No evidence of metastatic disease within the chest, abdomen, and pelvis. PET CT examination is recommended for further evaluation. 4. Interval transcatheter aortic valve replacement. Mild coronary artery calcification. 5. Morphologic changes in keeping with pulmonary arterial hypertension. 6. Progressive, severe atrophy of the left kidney. Minimal left nonobstructing nephrolithiasis. No urolithiasis. No hydronephrosis. 7. Severe distal colonic diverticulosis without superimposed acute inflammatory change. 8. 5.6 cm fusiform infrarenal abdominal aortic aneurysm. No periaortic inflammatory stranding or fluid collections are identified. Recommend referral to a vascular specialist. This recommendation follows ACR consensus guidelines: White Paper of the ACR Incidental Findings Committee II on Vascular Findings. J Am Coll Radiol 2013; 10:789-794. 9.  Aortic Atherosclerosis (ICD10-I70.0). Electronically Signed   By: Fidela Salisbury M.D.   On: 06/10/2022 19:45   CT Renal Stone Study  Result Date: 06/10/2022 CLINICAL DATA:  Head trauma, minor (Age >= 65y); Abdominal/flank pain, stone suspected; Lung nodule, > 72m. Mid to lower abdominal pain. EXAM: CT HEAD WITHOUT CONTRAST CT CHEST, ABDOMEN AND PELVIS WITHOUT CONTRAST TECHNIQUE: Contiguous axial images were obtained from the base of the skull through the vertex without intravenous contrast. Multidetector CT imaging of the chest, abdomen and pelvis was performed following the standard protocol without IV contrast. RADIATION DOSE REDUCTION: This exam was performed according to the departmental dose-optimization program which  includes automated exposure control, adjustment of the mA and/or kV according to patient size and/or use of iterative  reconstruction technique. COMPARISON:  CT head 05/30/2022, CT chest 05/22/2015, CT abdomen pelvis 12/02/2016 FINDINGS: CT HEAD FINDINGS Brain: Normal anatomic configuration of the brain. Parenchymal volume loss is present, commensurate with the patient's age. There is moderate global ventriculomegaly which appears stable since prior examinations dating as far back as 12/02/2016. The degree of ventricular dilation, however, appears disproportionate to the degree cortical atrophy and changes of normal pressure hydrocephalus are not excluded. No evidence of acute intracranial hemorrhage or infarct. No abnormal mass effect or midline shift. No abnormal intra or extra-axial mass lesion. Cerebellum is unremarkable. Vascular: Moderate atherosclerotic calcification within the carotid siphons. No asymmetric hyperdense vasculature at the skull base. Skull: Normal. Negative for fracture or focal lesion. Sinuses/Orbits: Paranasal sinuses are clear. Ocular lenses have been removed. Orbits are otherwise unremarkable. Other: Mastoid air cells and middle ear cavities are clear. Stable 8 mm nodule within the left frontal scalp, nonspecific, possibly representing a sebaceous or epidermoid cyst. CT CHEST FINDINGS Cardiovascular: Interval transcatheter aortic valve replacement. Extensive calcification of the mitral valve annulus. Mild coronary artery calcification. Global cardiac size within normal limits. No pericardial effusion. Central pulmonary arteries are stably enlarged in keeping with changes of pulmonary arterial hypertension. Mild atherosclerotic calcification within the thoracic aorta. No aortic aneurysm. 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 Visualized thyroid is  unremarkable. No pathologic thoracic adenopathy. The esophagus is unremarkable. Lungs/Pleura: Rounded density seen within the right apex on previously performed chest radiograph corresponds to a lobulated mass within the right upper lobe measuring 2.5 x 3.9 cm at axial image # 33/3 suspicious for a primary bronchogenic neoplasm. This abuts the visceral pleura but does not appear to a retract or transgress the structure. The lungs are otherwise clear. No pneumothorax or pleural effusion. Central airways are widely patent. Musculoskeletal: No acute bone abnormality. No lytic or blastic bone lesion. CT ABDOMEN AND PELVIS FINDINGS Hepatobiliary: Choose 2 Pancreas: Unremarkable Spleen: Unremarkable Adrenals/Urinary Tract: The adrenal glands are unremarkable. The kidneys are normal in position. There is marked progressive atrophy of the left kidney right kidney is normal in size. Punctate 1-2 mm calculi within the left kidney likely represent nonobstructing urinary calculi. No hydronephrosis. No ureteral calculi. Multiple simple cortical cysts are seen within the right kidney measuring up to 4.9 cm. No follow-up imaging is recommended for these lesions. Mild nonspecific perinephric inflammatory stranding noted. No perinephric fluid collections are identified. The bladder is unremarkable. Stomach/Bowel: Severe descending and sigmoid colonic diverticulosis without superimposed acute inflammatory change. The stomach, small bowel, and large bowel are otherwise unremarkable. No evidence of obstruction or focal inflammation. Appendix normal. No free intraperitoneal gas or fluid. Vascular/Lymphatic: Fusiform infrarenal abdominal aortic aneurysm is present measuring 5.6 cm in diameter (previously measuring 4.0 cm when measured in similar fashion). No periaortic inflammatory stranding or fluid collections are identified. Superimposed extensive aortoiliac atherosclerotic calcification. Prominent atherosclerotic calcification noted  within the mid superior mesenteric artery, however, the degree of stenosis is not well assessed on this noncontrast examination. No pathologic adenopathy within the abdomen and pelvis. Reproductive: No mass or other significant abnormality. Musculoskeletal: Osseous structures are age-appropriate. No acute bone abnormality. No lytic or blastic bone lesion. Other: No abdominal wall hernia. IMPRESSION:  1. No acute intracranial abnormality. No calvarial fracture. 2. Stable global ventriculomegaly. The degree of ventricular dilation, however, appears disproportionate to the degree of cortical atrophy and changes of normal pressure hydrocephalus are not excluded. Correlation with neurologic examination is recommended. 3. 3.9 cm lobulated mass within the right upper lobe suspicious for a primary bronchogenic neoplasm, new since prior examination. No evidence of metastatic disease within the chest, abdomen, and pelvis. PET CT examination is recommended for further evaluation. 4. Interval transcatheter aortic valve replacement. Mild coronary artery calcification. 5. Morphologic changes in keeping with pulmonary arterial hypertension. 6. Progressive, severe atrophy of the left kidney. Minimal left nonobstructing nephrolithiasis. No urolithiasis. No hydronephrosis. 7. Severe distal colonic diverticulosis without superimposed acute inflammatory change. 8. 5.6 cm fusiform infrarenal abdominal aortic aneurysm. No periaortic inflammatory stranding or fluid collections are identified. Recommend referral to a vascular specialist. This recommendation follows ACR consensus guidelines: White Paper of the ACR Incidental Findings Committee II on Vascular Findings. J Am Coll Radiol 2013; 10:789-794. 9.  Aortic Atherosclerosis (ICD10-I70.0). Electronically Signed   By: Fidela Salisbury M.D.   On: 06/10/2022 19:45   CT Chest Wo Contrast  Result Date: 06/10/2022 CLINICAL DATA:  Head trauma, minor (Age >= 65y); Abdominal/flank pain, stone  suspected; Lung nodule, > 62m. Mid to lower abdominal pain. EXAM: CT HEAD WITHOUT CONTRAST CT CHEST, ABDOMEN AND PELVIS WITHOUT CONTRAST TECHNIQUE: Contiguous axial images were obtained from the base of the skull through the vertex without intravenous contrast. Multidetector CT imaging of the chest, abdomen and pelvis was performed following the standard protocol without IV contrast. RADIATION DOSE REDUCTION: This exam was performed according to the departmental dose-optimization program which includes automated exposure control, adjustment of the mA and/or kV according to patient size and/or use of iterative reconstruction technique. COMPARISON:  CT head 05/30/2022, CT chest 05/22/2015, CT abdomen pelvis 12/02/2016 FINDINGS: CT HEAD FINDINGS Brain: Normal anatomic configuration of the brain. Parenchymal volume loss is present, commensurate with the patient's age. There is moderate global ventriculomegaly which appears stable since prior examinations dating as far back as 12/02/2016. The degree of ventricular dilation, however, appears disproportionate to the degree cortical atrophy and changes of normal pressure hydrocephalus are not excluded. No evidence of acute intracranial hemorrhage or infarct. No abnormal mass effect or midline shift. No abnormal intra or extra-axial mass lesion. Cerebellum is unremarkable. Vascular: Moderate atherosclerotic calcification within the carotid siphons. No asymmetric hyperdense vasculature at the skull base. Skull: Normal. Negative for fracture or focal lesion. Sinuses/Orbits: Paranasal sinuses are clear. Ocular lenses have been removed. Orbits are otherwise unremarkable. Other: Mastoid air cells and middle ear cavities are clear. Stable 8 mm nodule within the left frontal scalp, nonspecific, possibly representing a sebaceous or epidermoid cyst. CT CHEST FINDINGS Cardiovascular: Interval transcatheter aortic valve replacement. Extensive calcification of the mitral valve annulus.  Mild coronary artery calcification. Global cardiac size within normal limits. No pericardial effusion. Central pulmonary arteries are stably enlarged in keeping with changes of pulmonary arterial hypertension. Mild atherosclerotic calcification within the thoracic aorta. No aortic aneurysm. 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 Visualized thyroid is unremarkable. No pathologic thoracic adenopathy. The esophagus is unremarkable. Lungs/Pleura: Rounded density seen within the right apex on previously performed chest radiograph corresponds to a lobulated mass within the right upper lobe measuring 2.5 x  3.9 cm at axial image # 33/3 suspicious for a primary bronchogenic neoplasm. This abuts the visceral pleura but does not appear to a retract or transgress the structure. The lungs are otherwise clear. No pneumothorax or pleural effusion. Central airways are widely patent. Musculoskeletal: No acute bone abnormality. No lytic or blastic bone lesion. CT ABDOMEN AND PELVIS FINDINGS Hepatobiliary: Choose 2 Pancreas: Unremarkable Spleen: Unremarkable Adrenals/Urinary Tract: The adrenal glands are unremarkable. The kidneys are normal in position. There is marked progressive atrophy of the left kidney right kidney is normal in size. Punctate 1-2 mm calculi within the left kidney likely represent nonobstructing urinary calculi. No hydronephrosis. No ureteral calculi. Multiple simple cortical cysts are seen within the right kidney measuring up to 4.9 cm. No follow-up imaging is recommended for these lesions. Mild nonspecific perinephric inflammatory stranding noted. No perinephric fluid collections are identified. The bladder is unremarkable. Stomach/Bowel: Severe descending and  sigmoid colonic diverticulosis without superimposed acute inflammatory change. The stomach, small bowel, and large bowel are otherwise unremarkable. No evidence of obstruction or focal inflammation. Appendix normal. No free intraperitoneal gas or fluid. Vascular/Lymphatic: Fusiform infrarenal abdominal aortic aneurysm is present measuring 5.6 cm in diameter (previously measuring 4.0 cm when measured in similar fashion). No periaortic inflammatory stranding or fluid collections are identified. Superimposed extensive aortoiliac atherosclerotic calcification. Prominent atherosclerotic calcification noted within the mid superior mesenteric artery, however, the degree of stenosis is not well assessed on this noncontrast examination. No pathologic adenopathy within the abdomen and pelvis. Reproductive: No mass or other significant abnormality. Musculoskeletal: Osseous structures are age-appropriate. No acute bone abnormality. No lytic or blastic bone lesion. Other: No abdominal wall hernia. IMPRESSION: 1. No acute intracranial abnormality. No calvarial fracture. 2. Stable global ventriculomegaly. The degree of ventricular dilation, however, appears disproportionate to the degree of cortical atrophy and changes of normal pressure hydrocephalus are not excluded. Correlation with neurologic examination is recommended. 3. 3.9 cm lobulated mass within the right upper lobe suspicious for a primary bronchogenic neoplasm, new since prior examination. No evidence of metastatic disease within the chest, abdomen, and pelvis. PET CT examination is recommended for further evaluation. 4. Interval transcatheter aortic valve replacement. Mild coronary artery calcification. 5. Morphologic changes in keeping with pulmonary arterial hypertension. 6. Progressive, severe atrophy of the left kidney. Minimal left nonobstructing nephrolithiasis. No urolithiasis. No hydronephrosis. 7. Severe distal colonic diverticulosis without superimposed  acute inflammatory change. 8. 5.6 cm fusiform infrarenal abdominal aortic aneurysm. No periaortic inflammatory stranding or fluid collections are identified. Recommend referral to a vascular specialist. This recommendation follows ACR consensus guidelines: White Paper of the ACR Incidental Findings Committee II on Vascular Findings. J Am Coll Radiol 2013; 10:789-794. 9.  Aortic Atherosclerosis (ICD10-I70.0). Electronically Signed   By: Fidela Salisbury M.D.   On: 06/10/2022 19:45   DG Chest Port 1 View  Result Date: 06/10/2022 CLINICAL DATA:  Possible sepsis EXAM: PORTABLE CHEST 1 VIEW COMPARISON:  05/30/2022 FINDINGS: Stable heart size status post TAVR. Aortic atherosclerosis. Focal nodular density at the medial aspect of the right upper lobe measuring approximately 4.0 cm in diameter. Mildly increased perihilar and bibasilar interstitial markings. No appreciable pleural fluid collection. No pneumothorax. IMPRESSION: 1. Focal nodular density at the medial aspect of the right upper lobe measuring approximately 4.0 cm in diameter. CT of the chest is recommended for further evaluation. 2. Mildly increased perihilar and bibasilar interstitial markings, which may reflect bronchitic lung changes versus mild interstitial edema. Electronically Signed   By: Davina Poke D.O.  On: 06/10/2022 18:41   CT HEAD WO CONTRAST (5MM)  Result Date: 05/30/2022 CLINICAL DATA:  Altered mental status EXAM: CT HEAD WITHOUT CONTRAST TECHNIQUE: Contiguous axial images were obtained from the base of the skull through the vertex without intravenous contrast. RADIATION DOSE REDUCTION: This exam was performed according to the departmental dose-optimization program which includes automated exposure control, adjustment of the mA and/or kV according to patient size and/or use of iterative reconstruction technique. COMPARISON:  12/02/2016 FINDINGS: Brain: No evidence of acute infarction, hemorrhage, extra-axial collection or mass  lesion/mass effect. Global cortical and central atrophy.  Stable ventriculomegaly. Vascular: Intracranial atherosclerosis. Skull: Normal. Negative for fracture or focal lesion. Sinuses/Orbits: The visualized paranasal sinuses are essentially clear. The mastoid air cells are unopacified. Other: None. IMPRESSION: No evidence of acute intracranial abnormality. Atrophy with stable ventriculomegaly. Electronically Signed   By: Julian Hy M.D.   On: 05/30/2022 20:20   DG Chest 2 View  Result Date: 05/30/2022 CLINICAL DATA:  Altered mental status EXAM: CHEST - 2 VIEW COMPARISON:  Chest x-ray 02/17/2018 FINDINGS: Patient is status post TAVR. The heart size and mediastinal contours are within normal limits. Both lungs are clear. The visualized skeletal structures are unremarkable. IMPRESSION: No active cardiopulmonary disease. Electronically Signed   By: Ronney Asters M.D.   On: 05/30/2022 17:15     Subjective: - no chest pain, shortness of breath, no abdominal pain, nausea or vomiting.   Discharge Exam: BP (!) 140/56 (BP Location: Left Arm)   Pulse 91   Temp 98.1 F (36.7 C)   Resp 16   Ht _0  (1.778 m)   Wt 99.8 kg   SpO2 100%   BMI 31.57 kg/m   General: Pt is alert, awake, not in acute distress Cardiovascular: RRR, S1/S2 +, no rubs, no gallops Respiratory: CTA bilaterally, no wheezing, no rhonchi Abdominal: Soft, NT, ND, bowel sounds + Extremities: no edema, no cyanosis    The results of significant diagnostics from this hospitalization (including imaging, microbiology, ancillary and laboratory) are listed below for reference.     Microbiology: Recent Results (from the past 240 hour(s))  Resp panel by RT-PCR (RSV, Flu A&B, Covid) Anterior Nasal Swab     Status: None   Collection Time: 06/10/22  6:15 PM   Specimen: Anterior Nasal Swab  Result Value Ref Range Status   SARS Coronavirus 2 by RT PCR NEGATIVE NEGATIVE Final    Comment: (NOTE) SARS-CoV-2 target nucleic acids  are NOT DETECTED.  The SARS-CoV-2 RNA is generally detectable in upper respiratory specimens during the acute phase of infection. The lowest concentration of SARS-CoV-2 viral copies this assay can detect is 138 copies/mL. A negative result does not preclude SARS-Cov-2 infection and should not be used as the sole basis for treatment or other patient management decisions. A negative result may occur with  improper specimen collection/handling, submission of specimen other than nasopharyngeal swab, presence of viral mutation(s) within the areas targeted by this assay, and inadequate number of viral copies(<138 copies/mL). A negative result must be combined with clinical observations, patient history, and epidemiological information. The expected result is Negative.  Fact Sheet for Patients:  EntrepreneurPulse.com.au  Fact Sheet for Healthcare Providers:  IncredibleEmployment.be  This test is no t yet approved or cleared by the [place] FDA and  has been authorized for detection and/or diagnosis of SARS-CoV-2 by FDA under an Emergency Use Authorization (EUA). This EUA will remain  in effect (meaning this test can be used) for the duration of  the COVID-19 declaration under Section 564(b)(1) of the Act, 21 U.S.C.section 360bbb-3(b)(1), unless the authorization is terminated  or revoked sooner.       Influenza A by PCR NEGATIVE NEGATIVE Final   Influenza B by PCR NEGATIVE NEGATIVE Final    Comment: (NOTE) The Xpert Xpress SARS-CoV-2/FLU/RSV plus assay is intended as an aid in the diagnosis of influenza from Nasopharyngeal swab specimens and should not be used as a sole basis for treatment. Nasal washings and aspirates are unacceptable for Xpert Xpress SARS-CoV-2/FLU/RSV testing.  Fact Sheet for Patients: EntrepreneurPulse.com.au  Fact Sheet for Healthcare Providers: IncredibleEmployment.be  This test is  not yet approved or cleared by the [place] FDA and has been authorized for detection and/or diagnosis of SARS-CoV-2 by FDA under an Emergency Use Authorization (EUA). This EUA will remain in effect (meaning this test can be used) for the duration of the COVID-19 declaration under Section 564(b)(1) of the Act, 21 U.S.C. section 360bbb-3(b)(1), unless the authorization is terminated or revoked.     Resp Syncytial Virus by PCR NEGATIVE NEGATIVE Final    Comment: (NOTE) Fact Sheet for Patients: EntrepreneurPulse.com.au  Fact Sheet for Healthcare Providers: IncredibleEmployment.be  This test is not yet approved or cleared by the [place] FDA and has been authorized for detection and/or diagnosis of SARS-CoV-2 by FDA under an Emergency Use Authorization (EUA). This EUA will remain in effect (meaning this test can be used) for the duration of the COVID-19 declaration under Section 564(b)(1) of the Act, 21 U.S.C. section 360bbb-3(b)(1), unless the authorization is terminated or revoked.  Performed at [facility], [address] [place], [place] [address]   Blood Culture (routine x 2)     Status: Abnormal   Collection Time: 06/10/22  6:15 PM   Specimen: BLOOD LEFT ARM  Result Value Ref Range Status   Specimen Description   Final    BLOOD LEFT ARM Performed at [facility], [place] [address] [place], [place] [address]    Special Requests   Final    BOTTLES DRAWN AEROBIC AND ANAEROBIC Blood Culture results may not be optimal due to an inadequate volume of blood received in culture bottles Performed at [facility], [address], [place], [place] [address]    Culture  Setup Time   Final    GRAM NEGATIVE RODS IN BOTH AEROBIC AND ANAEROBIC BOTTLES CRITICAL RESULT CALLED TO, READ BACK BY AND VERIFIED WITH: Pamelia Hoit 06/11/22 0841 MW Performed at [facility], [address] [place], [place]  [address]    Culture (A)  Final    ESCHERICHIA COLI SUSCEPTIBILITIES PERFORMED ON PREVIOUS CULTURE WITHIN THE LAST 5 DAYS. Performed at [facility], [place] [address] [place], [place] [address]    Report Status 06/13/2022 FINAL  Final  Blood Culture (routine x 2)     Status: Abnormal   Collection Time: 06/10/22  6:15 PM   Specimen: BLOOD RIGHT ARM  Result Value Ref Range Status   Specimen Description   Final    BLOOD RIGHT ARM Performed at [facility], [place] [address], [place], [place] [address]    Special Requests   Final    BOTTLES DRAWN AEROBIC AND ANAEROBIC Blood Culture results may not be optimal due to an inadequate volume of blood received in culture bottles Performed at [facility], [address] [place],  [address]    Culture  Setup Time   Final    GRAM NEGATIVE  RODS IN BOTH AEROBIC AND ANAEROBIC BOTTLES CRITICAL RESULT CALLED TO, READ BACK BY AND VERIFIED WITH: Pamelia Hoit 06/11/22 0842 MW Performed at [facility], [place] [address] [place], [place] [address]    Culture ESCHERICHIA COLI (A)  Final   Report Status 06/13/2022 FINAL  Final   Organism ID, Bacteria ESCHERICHIA COLI  Final      Susceptibility   Escherichia coli - MIC*    AMPICILLIN >=32 RESISTANT Resistant     CEFAZOLIN <=4 SENSITIVE Sensitive     CEFEPIME <=0.12 SENSITIVE Sensitive     CEFTAZIDIME <=1 SENSITIVE Sensitive     CEFTRIAXONE <=0.25 SENSITIVE Sensitive     CIPROFLOXACIN <=0.25 SENSITIVE Sensitive     GENTAMICIN <=1 SENSITIVE Sensitive     IMIPENEM <=0.25 SENSITIVE Sensitive     TRIMETH/SULFA <=20 SENSITIVE Sensitive     AMPICILLIN/SULBACTAM 16 INTERMEDIATE Intermediate     PIP/TAZO <=4 SENSITIVE Sensitive     * ESCHERICHIA COLI  Urine Culture     Status: Abnormal   Collection Time: 06/10/22  6:15 PM   Specimen: Urine, Random  Result Value Ref Range Status   Specimen Description   Final    URINE, RANDOM Performed at [facility], [address] [place], [place] [address]    Special Requests   Final    NONE Performed at [facility], [address] [place],  [address]    Culture MULTIPLE SPECIES PRESENT, SUGGEST RECOLLECTION (A)  Final   Report Status 06/11/2022 FINAL  Final  Blood Culture ID Panel (Reflexed)     Status: Abnormal   Collection Time: 06/10/22  6:15 PM  Result Value Ref Range Status   Enterococcus faecalis NOT DETECTED NOT DETECTED Final   Enterococcus Faecium NOT DETECTED NOT DETECTED Final   Listeria monocytogenes NOT DETECTED NOT DETECTED Final   Staphylococcus species NOT DETECTED NOT DETECTED Final   Staphylococcus aureus (BCID) NOT DETECTED NOT DETECTED Final   Staphylococcus epidermidis NOT DETECTED NOT DETECTED Final   Staphylococcus lugdunensis NOT DETECTED NOT DETECTED Final   Streptococcus species NOT DETECTED NOT DETECTED Final   Streptococcus agalactiae NOT DETECTED NOT DETECTED Final   Streptococcus pneumoniae NOT DETECTED NOT DETECTED Final   Streptococcus pyogenes NOT DETECTED NOT DETECTED Final   A.calcoaceticus-baumannii NOT DETECTED NOT DETECTED Final   Bacteroides fragilis NOT DETECTED NOT DETECTED Final   Enterobacterales DETECTED (A) NOT DETECTED Final    Comment: Enterobacterales represent a large order of gram negative bacteria, not a single organism. CRITICAL RESULT CALLED TO, READ BACK BY AND VERIFIED WITH: MORGAN GOBBLE 06/11/22 0841 MW    Enterobacter cloacae complex NOT DETECTED NOT DETECTED Final   Escherichia coli DETECTED (A) NOT DETECTED Final    Comment: CRITICAL RESULT CALLED TO, READ BACK BY AND VERIFIED WITH: MORGAN GOBBLE 06/11/22 0841 MW    Klebsiella aerogenes NOT DETECTED NOT DETECTED Final   Klebsiella oxytoca NOT DETECTED NOT DETECTED Final   Klebsiella pneumoniae NOT DETECTED NOT DETECTED Final   Proteus species NOT DETECTED NOT DETECTED Final   Salmonella species NOT DETECTED NOT DETECTED Final   Serratia marcescens NOT DETECTED NOT  DETECTED Final   Haemophilus influenzae NOT DETECTED NOT DETECTED Final   Neisseria meningitidis NOT DETECTED NOT DETECTED Final   Pseudomonas aeruginosa NOT DETECTED NOT DETECTED Final   Stenotrophomonas maltophilia NOT DETECTED NOT DETECTED Final   Candida albicans NOT DETECTED NOT DETECTED Final   Candida auris NOT DETECTED NOT DETECTED Final   Candida glabrata  NOT DETECTED NOT DETECTED Final   Candida krusei NOT DETECTED NOT DETECTED Final   Candida parapsilosis NOT DETECTED NOT DETECTED Final   Candida tropicalis NOT DETECTED NOT DETECTED Final   Cryptococcus neoformans/gattii NOT DETECTED NOT DETECTED Final   CTX-M ESBL NOT DETECTED NOT DETECTED Final   Carbapenem resistance IMP NOT DETECTED NOT DETECTED Final   Carbapenem resistance KPC NOT DETECTED NOT DETECTED Final   Carbapenem resistance NDM NOT DETECTED NOT DETECTED Final   Carbapenem resist OXA 48 LIKE NOT DETECTED NOT DETECTED Final   Carbapenem resistance VIM NOT DETECTED NOT DETECTED Final    Comment: Performed at [facility], [address] [place], [place] [address]     Labs: Basic Metabolic Panel: Recent Labs  Lab 06/10/22 1815 06/11/22 0444 06/12/22 0423 06/13/22 0713  NA 139 140 138 143  K 2.9* 3.1* 4.1 4.4  CL 96* 101 98 102  CO2 34* 31 28 34*  GLUCOSE 115* 132* 88 96  BUN 45* 46* 43* 42*  CREATININE 1.91* 1.75* 1.55* 1.38*  CALCIUM 7.9* 7.7* 8.0* 8.7*  MG 1.7  --  2.0  --   PHOS 2.4*  --   --   --    Liver Function Tests: Recent Labs  Lab 06/10/22 1815 06/12/22 0423  AST 150* 46*  ALT 63* 41  ALKPHOS 219* 179*  BILITOT 1.6* 0.6  PROT 5.8* 5.5*  ALBUMIN 2.2* 2.1*   CBC: Recent Labs  Lab 06/10/22 1815 06/11/22 0444 06/12/22 0423 06/13/22 0713  WBC 28.2* 25.2* 16.7* 10.0  NEUTROABS 26.7*  --   --   --   HGB 8.7* 8.3* 8.4* 9.4*  HCT 29.4* 27.8* 28.3* 31.4*  MCV 99.0 98.9 97.9 97.2  PLT 298 246 233 229   CBG: No results for input(s): "GLUCAP" in the last 168 hours. Hgb  A1c No results for input(s): "HGBA1C" in the last 72 hours. Lipid Profile No results for input(s): "CHOL", "HDL", "LDLCALC", "TRIG", "CHOLHDL", "LDLDIRECT" in the last 72 hours. Thyroid function studies No results for input(s): "TSH", "T4TOTAL", "T3FREE", "THYROIDAB" in the last 72 hours.  Invalid input(s): "FREET3" Urinalysis    Component Value Date/Time   COLORURINE YELLOW (A) 06/10/2022 1815   APPEARANCEUR CLOUDY (A) 06/10/2022 1815   LABSPEC 1.009 06/10/2022 1815   PHURINE 5.0 06/10/2022 1815   GLUCOSEU NEGATIVE 06/10/2022 1815   HGBUR MODERATE (A) 06/10/2022 1815   HGBUR negative 04/10/2010 1119   BILIRUBINUR NEGATIVE 06/10/2022 1815   BILIRUBINUR NEG 06/13/2016 1329   KETONESUR NEGATIVE 06/10/2022 1815   PROTEINUR 30 (A) 06/10/2022 1815   UROBILINOGEN negative 06/13/2016 1329   UROBILINOGEN 0.2 04/10/2010 1119   NITRITE NEGATIVE 06/10/2022 1815   LEUKOCYTESUR LARGE (A) 06/10/2022 1815    FURTHER DISCHARGE INSTRUCTIONS:   Get Medicines reviewed and adjusted: Please take all your medications with you for your next visit with your Primary MD   Laboratory/radiological data: Please request your Primary MD to go over all hospital tests and procedure/radiological results at the follow up, please ask your Primary MD to get all Hospital records sent to his/her office.   In some cases, they will be blood work, cultures and biopsy results pending at the time of your discharge. Please request that your primary care M.D. goes through all the records of your hospital data and follows up on these results.   Also Note the following: If you experience worsening of your admission symptoms, develop shortness of breath, life threatening emergency, suicidal or homicidal thoughts you  must seek medical attention immediately by calling 911 or calling your MD immediately  if symptoms less severe.   You must read complete instructions/literature along with all the possible adverse reactions/side  effects for all the Medicines you take and that have been prescribed to you. Take any new Medicines after you have completely understood and accpet all the possible adverse reactions/side effects.    Do not drive when taking Pain medications or sleeping medications (Benzodaizepines)   Do not take more than prescribed Pain, Sleep and Anxiety Medications. It is not advisable to combine anxiety,sleep and pain medications without talking with your primary care practitioner   Special Instructions: If you have smoked or chewed Tobacco  in the last 2 yrs please stop smoking, stop any regular Alcohol  and or any Recreational drug use.   Wear Seat belts while driving.   Please note: You were cared for by a hospitalist during your hospital stay. Once you are discharged, your primary care physician will handle any further medical issues. Please note that NO REFILLS for any discharge medications will be authorized once you are discharged, as it is imperative that you return to your primary care physician (or establish a relationship with a primary care physician if you do not have one) for your post hospital discharge needs so that they can reassess your need for medications and monitor your lab values.  Time coordinating discharge: 35 minutes  SIGNED:  Marzetta Board, MD, PhD 06/13/2022, 11:42 AM

## 2022-06-13 NOTE — TOC Transition Note (Signed)
Transition of Care [facility]) - CM/SW Discharge Note   Patient Details  Name: [name] [name] MRN: [id number] Date of Birth: 03/18/1934  [facility] of [facility]) CM/SW Contact:  Tiburcio Bash, LCSW Phone Number: 06/13/2022, 10:47 AM   Clinical Narrative:     Patient to dc home with [facility] PT OT and RN, CSW has informed Meg with [facility] of dc today.   Patient's hoyer lift and hospital bed to be delivered this afternoon to home per [facility] with [employer]. Patient's spouse updated.   Once delivered. EMS will be called to transport patient home at dc, address on facesheet accurate. Patient has 5L O2 at home with [employer].   No further dc needs at this time.   Final next level of care: Home w Home Health Services Barriers to Discharge: No Barriers Identified   Patient Goals and CMS Choice CMS Medicare.gov Compare Post Acute Care list provided to:: Patient Choice offered to / list presented to : Patient  Discharge Placement                      Patient and family notified of of transfer: 06/13/22  Discharge Plan and Services Additional resources added to the After Visit Summary for       Post Acute Care Choice: Durable Medical Equipment, Resumption of Svcs/PTA Provider          DME Arranged: Hospital bed (hoyer) DME Agency: [employer] Date DME Agency Contacted: 06/13/22   Representative spoke with at DME Agency: Suanne Marker HH Arranged: RN, PT, OT [facility] Agency: [place] Date [place]: 06/13/22 Time [place]: 78 Representative spoke with at [place]: [place] Determinants of Health ([place]) Interventions [place]: No Food Insecurity (06/12/2022)  Housing: Low Risk  (06/12/2022)  Transportation Needs: No Transportation Needs (06/12/2022)  Utilities: Not At Risk (06/12/2022)  Alcohol Screen: Low Risk  (12/16/2021)  Depression (PHQ2-9): Low Risk  (12/16/2021)  Financial Resource Strain: Low Risk  (12/16/2021)  Physical  Activity: Sufficiently Active (12/16/2021)  Social Connections: Socially Integrated (12/16/2021)  Stress: No Stress Concern Present (11/02/2019)  Tobacco Use: Medium Risk (06/12/2022)     Readmission Risk Interventions     No data to display

## 2022-06-13 NOTE — Consult Note (Addendum)
[place] Nurse Consult Note: Reason for Consult: Consult requested for buttocks and bilat legs.  Performed remotely after review of progress notes, nursing wound care flow sheet, and photos in the EMR.  Wound type: Bilat buttocks and sacrum are noted to have Stage 2 pressure injuries which were present on admission. These can be treated independently by the bedside nurse using the Skin care order set in [place].   Pressure Injury POA: Yes Bilat legs with mod amt yellow drainage, red moist and macerated with patchy areas of dry scabs, red moist partial thickness wounds to anterior legs, and right outer leg with a red moist full thickness wound.  Dressing procedure/placement/frequency: Topical treatment orders provided for bedside nurses to perform as follows to promote drying and healing:  1. Apply a piece of Aquacel Kellie Simmering # [phone]) to right outer leg wound Q day.  Moisten with NS to remove previous dressings each time. 2. Apply Xeroform gauze over other wound areas to BLE Q day.  Cover feet/legs with ABD pads and kerlex. 3.  Foam dressing to buttocks/sacrum wounds, change Q 3 days or PRN soiling. Please re-consult if further assistance is needed.  Thank-you,  Julien Girt MSN, [place], [place], [place], [place]

## 2022-06-13 NOTE — Care Management Important Message (Signed)
Important Message  Patient Details  Name: [name] [name] MRN: [id number] Date of Birth: 16-Jan-1934   Medicare Important Message Given:  Yes     [name] 06/13/2022, 11:36 AM

## 2022-06-16 DIAGNOSIS — G4733 Obstructive sleep apnea (adult) (pediatric): Secondary | ICD-10-CM | POA: Diagnosis not present

## 2022-06-16 DIAGNOSIS — J449 Chronic obstructive pulmonary disease, unspecified: Secondary | ICD-10-CM | POA: Diagnosis not present

## 2022-06-16 DIAGNOSIS — J9611 Chronic respiratory failure with hypoxia: Secondary | ICD-10-CM | POA: Diagnosis not present

## 2022-06-17 ENCOUNTER — Telehealth: Payer: Self-pay | Admitting: *Deleted

## 2022-06-17 ENCOUNTER — Telehealth: Payer: Self-pay | Admitting: Family Medicine

## 2022-06-17 ENCOUNTER — Inpatient Hospital Stay: Payer: PPO

## 2022-06-17 DIAGNOSIS — D500 Iron deficiency anemia secondary to blood loss (chronic): Secondary | ICD-10-CM | POA: Diagnosis not present

## 2022-06-17 DIAGNOSIS — M1711 Unilateral primary osteoarthritis, right knee: Secondary | ICD-10-CM | POA: Diagnosis not present

## 2022-06-17 DIAGNOSIS — I872 Venous insufficiency (chronic) (peripheral): Secondary | ICD-10-CM | POA: Diagnosis not present

## 2022-06-17 DIAGNOSIS — Z4800 Encounter for change or removal of nonsurgical wound dressing: Secondary | ICD-10-CM | POA: Diagnosis not present

## 2022-06-17 DIAGNOSIS — J9610 Chronic respiratory failure, unspecified whether with hypoxia or hypercapnia: Secondary | ICD-10-CM | POA: Diagnosis not present

## 2022-06-17 DIAGNOSIS — J9611 Chronic respiratory failure with hypoxia: Secondary | ICD-10-CM | POA: Diagnosis not present

## 2022-06-17 DIAGNOSIS — J449 Chronic obstructive pulmonary disease, unspecified: Secondary | ICD-10-CM | POA: Diagnosis not present

## 2022-06-17 DIAGNOSIS — G4733 Obstructive sleep apnea (adult) (pediatric): Secondary | ICD-10-CM | POA: Diagnosis not present

## 2022-06-17 DIAGNOSIS — I5033 Acute on chronic diastolic (congestive) heart failure: Secondary | ICD-10-CM | POA: Diagnosis not present

## 2022-06-17 DIAGNOSIS — L97921 Non-pressure chronic ulcer of unspecified part of left lower leg limited to breakdown of skin: Secondary | ICD-10-CM | POA: Diagnosis not present

## 2022-06-17 DIAGNOSIS — F419 Anxiety disorder, unspecified: Secondary | ICD-10-CM | POA: Diagnosis not present

## 2022-06-17 DIAGNOSIS — I110 Hypertensive heart disease with heart failure: Secondary | ICD-10-CM | POA: Diagnosis not present

## 2022-06-17 MED ORDER — FINASTERIDE 5 MG PO TABS: 5.0000 mg | ORAL_TABLET | Freq: Every day | ORAL | 1 refills | Status: DC

## 2022-06-17 NOTE — Telephone Encounter (Signed)
Please give the order.   Please get extra details on his urination.  Does he have incontinence at night?  Only at night?  Any burning with urination?  Please let me know.  Thanks

## 2022-06-17 NOTE — Telephone Encounter (Signed)
Would try adding on finasteride.  Wouldn't use flomax now given sulfa intolerance.  And I wouldn't start anticholinergic med given other issues.  Finasteride doesn't start working immediately.  I am hopeful that his abx tx will help.  Thanks.

## 2022-06-17 NOTE — Patient Outreach (Signed)
  Care Coordination [facility] Note Transition Care Management Unsuccessful Follow-up Telephone Call Date of discharge and from where:  [facility] [phone]  Attempts:  1st Attempt  Reason for unsuccessful TCM follow-up call:  Left voice message  [facility] [phone]

## 2022-06-17 NOTE — Telephone Encounter (Signed)
Verbal orders have been given to [facility] for PT.  Spoke with patients daughter and she states he has incontinence all day and night; does wear depends all the time. Patient was in the hospital with sepsis from UTI. Was given abx thru IV and then went home with abx to take x 6 days. Does not think he has any burning right now since he has had the antibiotics.

## 2022-06-17 NOTE — Telephone Encounter (Signed)
[name] [name] with [facility] called stating the pt is back home from [place] is requesting PT twice a week, for 3 weeks? [name] [name] states the wife has stated the pt wakes up throughout the night with urine everywhere. Pt's wife is asking if there something Damita Dunnings can recommend to help with the issue? Call back # [phone], secured.

## 2022-06-17 NOTE — Addendum Note (Signed)
Addended by: Tonia Ghent on: 06/17/2022 05:04 PM   Modules accepted: Orders

## 2022-06-18 ENCOUNTER — Telehealth: Payer: Self-pay | Admitting: *Deleted

## 2022-06-18 ENCOUNTER — Telehealth: Payer: Self-pay

## 2022-06-18 ENCOUNTER — Telehealth: Payer: Self-pay | Admitting: Family Medicine

## 2022-06-18 NOTE — Telephone Encounter (Signed)
Called patients daughter and reviewed all information. She verbalized understanding and will pickup prescription. Will call if any further questions.

## 2022-06-18 NOTE — Telephone Encounter (Signed)
Pt no showed for his retacrit injection yesterday.   Per scheduling - He has been in [facility] can not transport- daughter would like to know if he can get it this shot and give at home. Please call her with information.

## 2022-06-18 NOTE — Patient Outreach (Signed)
  Care Coordination [facility] Note Transition Care Management Unsuccessful Follow-up Telephone Call  Date of discharge and from where:  [facility] 06/13/22  Attempts:  2nd Attempt  Reason for unsuccessful TCM follow-up call:  No answer/busy  Johnney Killian, RN, BSN, CCM Care Management Coordinator [facility] Phone: [phone]: [phone]

## 2022-06-18 NOTE — Patient Outreach (Addendum)
  Care Coordination [facility] Note Transition Care Management Follow-up Telephone Call Date of discharge and from where: [facility] [id number] How have you been since you were released from the hospital? He is very weak Any questions or concerns? Yes He is very weak and it is hard to get him in the car. PT has not started yet.  She is afraid he will get weaker before PT starts Items Reviewed: Did the pt receive and understand the discharge instructions provided? Yes  Medications obtained and verified? Yes  Other? No  Any new allergies since your discharge? No  Dietary orders reviewed? No Do you have support at home? Yes   Home Care and Equipment/Supplies: Were home health services ordered? yes If so, what is the name of the agency? [facility]  Has the agency set up a time to come to the patient's home? yes Were any new equipment or medical supplies ordered?  Yes: hospital bed and hoyer lift What is the name of the medical supply agency? [facility] Were you able to get the supplies/equipment? yes Do you have any questions related to the use of the equipment or supplies? Yes: Patient daughter did not know how to use the hoyer lift.   Functional Questionnaire: (I = Independent and D = Dependent) ADLs: D  Bathing/Dressing- D  Meal Prep- D  Eating- D  Maintaining continence- D  Transferring/Ambulation- D  Managing Meds- D  Follow up appointments reviewed:  PCP Hospital f/u appt confirmed? No  Patient daughter wants to wait until PT to start working with the patient to strengthening before going to PCP [facility] f/u appt confirmed? No  . Are transportation arrangements needed? Yes  If their condition worsens, is the pt aware to call PCP or go to the Emergency Dept.? Yes Was the patient provided with contact information for the PCP's office or ED? Yes Was to pt encouraged to call back with questions or concerns? Yes  SDOH assessments and interventions completed:   Yes SDOH  Interventions Today    Flowsheet Row Most Recent Value  SDOH Interventions   Food Insecurity Interventions Intervention Not Indicated  Housing Interventions Intervention Not Indicated  Transportation Interventions Other (Comment)  [Patient is very weak. The PT has not started. The daughter wants to wait until it is started to be able to get him in for his Dr appt. Difficulty getting him in the car.]       Care Coordination Interventions:  [place] discussed with patient daughter  RN discussed getting a sliding board to help moving from bed to wheelchair and car and discussed an overhead trapeze bar.   Encounter Outcome:  Pt. Visit Completed    [place] [facility] [phone]

## 2022-06-18 NOTE — Telephone Encounter (Signed)
Left voicemail with Magda Paganini to callback. Could not verify that the voicemail was a secure line.

## 2022-06-18 NOTE — Telephone Encounter (Signed)
Gave approval of verbal orders to [place] from [place].

## 2022-06-18 NOTE — Telephone Encounter (Signed)
Attempted to reach pt/family. No answer unable to leave vm.

## 2022-06-18 NOTE — Telephone Encounter (Signed)
Home Health verbal orders [place] Name: [name] [name] number: [phone]  Requesting OT/PT/Skilled nursing/Social Work/Speech:  Reason:To Continue Skilled Nursing   Frequency: 2 x w  respiratory// wound care   Please forward to [facility] pool or providers CMA Stated orders will also be fax overs

## 2022-06-18 NOTE — Telephone Encounter (Signed)
Please give the order.  Thanks.   

## 2022-06-19 ENCOUNTER — Telehealth: Payer: Self-pay

## 2022-06-19 NOTE — Patient Outreach (Addendum)
  Care Coordination   Follow Up Visit Note  Late entry from 1.3.24 at 3:53 PM 06/19/2022 Name: [name] [name] MRN: [id number] DOB: 1934-04-22  [name] [name] is a 89 y.o. year old male who sees Tonia Ghent, MD for primary care. I  spoke with daughter [name] [name].  What matters to the patients health and wellness today?  Obtaining custodial benefit for her father.    Goals Addressed             This Visit's Progress    COMPLETED: "patients daughter requested information on [employer] custodial benefit"       Care Coordination Interventions: Provided patient and/or caregiver with complete information about [employer] Custodial benefit and how to reach the concierge ([employer])          SDOH assessments and interventions completed:  Yes  SDOH Interventions Today    Flowsheet Row Most Recent Value  SDOH Interventions   Housing Interventions Intervention Not Indicated  Transportation Interventions Intervention Not Indicated        Care Coordination Interventions:  Yes, provided   Follow up plan: No further intervention required.   Encounter Outcome:  Pt. Visit Completed

## 2022-06-24 ENCOUNTER — Telehealth: Payer: Self-pay | Admitting: Family Medicine

## 2022-06-24 DIAGNOSIS — G4733 Obstructive sleep apnea (adult) (pediatric): Secondary | ICD-10-CM | POA: Diagnosis not present

## 2022-06-24 NOTE — Telephone Encounter (Signed)
LMTCB

## 2022-06-24 NOTE — Telephone Encounter (Signed)
[phone]  

## 2022-06-24 NOTE — Telephone Encounter (Signed)
Pt daughter Venida Jarvis return call

## 2022-06-24 NOTE — Telephone Encounter (Addendum)
Pt daughter Neoma Laming called in requesting a call back. Stated pt need's prior authorization for Custodial care . Please Advise [phone]

## 2022-06-24 NOTE — Telephone Encounter (Signed)
Dr. Damita Dunnings - Spoke with patients daughter and she spoke with patients insurance company about the custodial care. She stated they pay for 20 hours of custodial care after each hospital visit. She said that [facility] in [place] is in network with the insurance.    Amy - Separate issue is patient needs transportation in order to come in for OV. Daughter can not get patient here on her own anymore. She is requesting a follow up with Dr. Damita Dunnings and needs help with this.

## 2022-06-25 DIAGNOSIS — I872 Venous insufficiency (chronic) (peripheral): Secondary | ICD-10-CM | POA: Diagnosis not present

## 2022-06-25 DIAGNOSIS — L89312 Pressure ulcer of right buttock, stage 2: Secondary | ICD-10-CM | POA: Diagnosis not present

## 2022-06-25 NOTE — Telephone Encounter (Signed)
I thank all involved.  Janett Billow, please send an order for custodial care Diagnosis I50.32, I83.009, L 97.909.  Thanks.

## 2022-06-27 NOTE — Telephone Encounter (Signed)
I asked for Joellens help with this and it was suggested to write this out on rx pad and send to the company that way. Hopefully they will take that.

## 2022-06-27 NOTE — Telephone Encounter (Signed)
Written.  Thanks. Please submit.

## 2022-06-27 NOTE — Telephone Encounter (Signed)
Order has been faxed

## 2022-06-28 DIAGNOSIS — R32 Unspecified urinary incontinence: Secondary | ICD-10-CM | POA: Diagnosis not present

## 2022-06-28 DIAGNOSIS — R300 Dysuria: Secondary | ICD-10-CM | POA: Diagnosis not present

## 2022-06-28 DIAGNOSIS — Z7401 Bed confinement status: Secondary | ICD-10-CM | POA: Diagnosis not present

## 2022-06-28 DIAGNOSIS — Z87440 Personal history of urinary (tract) infections: Secondary | ICD-10-CM | POA: Diagnosis not present

## 2022-06-29 ENCOUNTER — Other Ambulatory Visit: Payer: Self-pay | Admitting: Family Medicine

## 2022-06-30 DIAGNOSIS — R300 Dysuria: Secondary | ICD-10-CM | POA: Diagnosis not present

## 2022-07-01 ENCOUNTER — Telehealth: Payer: Self-pay | Admitting: Family Medicine

## 2022-07-01 DIAGNOSIS — R32 Unspecified urinary incontinence: Secondary | ICD-10-CM

## 2022-07-01 DIAGNOSIS — R6889 Other general symptoms and signs: Secondary | ICD-10-CM

## 2022-07-01 NOTE — Telephone Encounter (Signed)
Wife was asking about condom cath due to urinary incontinence and bed trapeze due to difficulty in repositioning in bed.  Can you talk to me about setting this up?  Thanks.

## 2022-07-02 NOTE — Telephone Encounter (Signed)
DME orders have been done and message sent to [facility] staff.

## 2022-07-02 NOTE — Addendum Note (Signed)
Addended by: Sherrilee Gilles B on: 07/02/2022 04:18 PM   Modules accepted: Orders

## 2022-07-04 ENCOUNTER — Other Ambulatory Visit: Payer: Self-pay | Admitting: Internal Medicine

## 2022-07-08 ENCOUNTER — Inpatient Hospital Stay: Payer: PPO | Attending: Oncology

## 2022-07-08 ENCOUNTER — Inpatient Hospital Stay: Payer: PPO

## 2022-07-08 ENCOUNTER — Other Ambulatory Visit: Payer: Self-pay | Admitting: Internal Medicine

## 2022-07-09 DIAGNOSIS — J9610 Chronic respiratory failure, unspecified whether with hypoxia or hypercapnia: Secondary | ICD-10-CM | POA: Diagnosis not present

## 2022-07-09 DIAGNOSIS — M1711 Unilateral primary osteoarthritis, right knee: Secondary | ICD-10-CM | POA: Diagnosis not present

## 2022-07-09 DIAGNOSIS — N39498 Other specified urinary incontinence: Secondary | ICD-10-CM | POA: Diagnosis not present

## 2022-07-09 DIAGNOSIS — J449 Chronic obstructive pulmonary disease, unspecified: Secondary | ICD-10-CM | POA: Diagnosis not present

## 2022-07-10 ENCOUNTER — Telehealth: Payer: Self-pay | Admitting: Family Medicine

## 2022-07-10 ENCOUNTER — Telehealth: Payer: Self-pay

## 2022-07-10 NOTE — Telephone Encounter (Signed)
Home Health verbal orders [place] Agency Name: [place] number: [phone]  Requesting OT/PT/Skilled nursing/Social Work/Speech:PT  Reason:  Frequency:2 week 4, 1 week 4/   Please forward to [facility] pool or providers CMA   She also stated that patient wife [name] was treated for cheilitis and the redness was still there and swollen.

## 2022-07-10 NOTE — Telephone Encounter (Signed)
I think that if his status has changed, ie inc pain with skin changes then he needs in person eval, either here, UC or ER.  We need to see about skin integrity and se about options for pain control.  I don't think it is reasonable to change his meds w/o having the patient seen first.    I get the point about exposure but I still think in person eval would be appropriate.

## 2022-07-10 NOTE — Telephone Encounter (Signed)
Called and spoke with patients wife about message from Dr. Damita Dunnings. [name] [name] would not make an appt for patient at this time as she stated she does not know ow they would get him here. [place] transportation does not travel outside of that county so they would not bring him here for his appt. I offered to try to get him scheduled at the [place] office but she did not want to do that either. She wants to stay with Dr. Damita Dunnings but just doesn't know how they will. They wont go to the ER as advised or UC. She stated again that she wont make an appt in case they can't make it and she was going to try to see what they can do.

## 2022-07-10 NOTE — Telephone Encounter (Signed)
Error

## 2022-07-10 NOTE — Telephone Encounter (Signed)
Robin OT with [facility] is with pt. Pt complains of bilateral lower leg pain since left hospital last time12/29/23with pain level of 10. Pt said he thinks like neuropathy pain but has not been dx with neuropathy. Pt is not sleeping well due to leg pain. Shirlean Mylar said both legs are darker discolored; not black and no redness seen in lower legs. No swelling noted; ankle measurements have been consistent. Pt cannot stand so cannot weight pt. Pt does not walk (this is not new). Shirlean Mylar said both lower legs feel warm to touch around mid calf. Pt said hurts when touches toes on lt foot. Shirlean Mylar said there are red spots on rt big toe and 2nd toe and Robin nor pt is sure how long that has been there. Pt denies CP or SOB; pt is presently on 5L oxygen continuously.advised due to pt not beng able to come to office and due to pain level of lower legs with both lower legs feeling warm to touch and the redness of rt big toe and 2nd toe advised pt should be seen at ED for eval and testing. Per Shirlean Mylar pt refusing to go to ED for evaluation due to fear of exposure to covid, rsv or flu. Pt wants to wait and see how he does. Shirlean Mylar said after reviewed by Dr Damita Dunnings please call pts wife with instructions. Sending note to Dr Damita Dunnings, Damita Dunnings pool and will teams [place] CMA.

## 2022-07-11 NOTE — Telephone Encounter (Signed)
Please give the order.  Thanks.   Please triage patient's wife in her chart.

## 2022-07-11 NOTE — Telephone Encounter (Signed)
Sent message to triage about patients wife.   Gave verbal orders to [facility] for request PT.

## 2022-07-12 DIAGNOSIS — J9611 Chronic respiratory failure with hypoxia: Secondary | ICD-10-CM | POA: Diagnosis not present

## 2022-07-12 DIAGNOSIS — J449 Chronic obstructive pulmonary disease, unspecified: Secondary | ICD-10-CM | POA: Diagnosis not present

## 2022-07-14 DIAGNOSIS — R2689 Other abnormalities of gait and mobility: Secondary | ICD-10-CM | POA: Diagnosis not present

## 2022-07-17 DIAGNOSIS — G4733 Obstructive sleep apnea (adult) (pediatric): Secondary | ICD-10-CM | POA: Diagnosis not present

## 2022-07-17 DIAGNOSIS — I87311 Chronic venous hypertension (idiopathic) with ulcer of right lower extremity: Secondary | ICD-10-CM | POA: Diagnosis not present

## 2022-07-17 DIAGNOSIS — I110 Hypertensive heart disease with heart failure: Secondary | ICD-10-CM | POA: Diagnosis not present

## 2022-07-17 DIAGNOSIS — L89312 Pressure ulcer of right buttock, stage 2: Secondary | ICD-10-CM | POA: Diagnosis not present

## 2022-07-17 DIAGNOSIS — D500 Iron deficiency anemia secondary to blood loss (chronic): Secondary | ICD-10-CM | POA: Diagnosis not present

## 2022-07-17 DIAGNOSIS — I5033 Acute on chronic diastolic (congestive) heart failure: Secondary | ICD-10-CM | POA: Diagnosis not present

## 2022-07-17 DIAGNOSIS — F419 Anxiety disorder, unspecified: Secondary | ICD-10-CM | POA: Diagnosis not present

## 2022-07-17 DIAGNOSIS — J449 Chronic obstructive pulmonary disease, unspecified: Secondary | ICD-10-CM | POA: Diagnosis not present

## 2022-07-17 DIAGNOSIS — J9611 Chronic respiratory failure with hypoxia: Secondary | ICD-10-CM | POA: Diagnosis not present

## 2022-07-17 DIAGNOSIS — L97911 Non-pressure chronic ulcer of unspecified part of right lower leg limited to breakdown of skin: Secondary | ICD-10-CM | POA: Diagnosis not present

## 2022-07-18 DIAGNOSIS — M1711 Unilateral primary osteoarthritis, right knee: Secondary | ICD-10-CM | POA: Diagnosis not present

## 2022-07-18 DIAGNOSIS — J449 Chronic obstructive pulmonary disease, unspecified: Secondary | ICD-10-CM | POA: Diagnosis not present

## 2022-07-18 DIAGNOSIS — J9610 Chronic respiratory failure, unspecified whether with hypoxia or hypercapnia: Secondary | ICD-10-CM | POA: Diagnosis not present

## 2022-07-20 ENCOUNTER — Telehealth: Payer: Self-pay | Admitting: Family Medicine

## 2022-07-20 DIAGNOSIS — D500 Iron deficiency anemia secondary to blood loss (chronic): Secondary | ICD-10-CM | POA: Diagnosis not present

## 2022-07-20 DIAGNOSIS — Z954 Presence of other heart-valve replacement: Secondary | ICD-10-CM

## 2022-07-20 DIAGNOSIS — I350 Nonrheumatic aortic (valve) stenosis: Secondary | ICD-10-CM

## 2022-07-20 DIAGNOSIS — G4733 Obstructive sleep apnea (adult) (pediatric): Secondary | ICD-10-CM | POA: Diagnosis not present

## 2022-07-20 DIAGNOSIS — L97911 Non-pressure chronic ulcer of unspecified part of right lower leg limited to breakdown of skin: Secondary | ICD-10-CM | POA: Diagnosis not present

## 2022-07-20 DIAGNOSIS — F419 Anxiety disorder, unspecified: Secondary | ICD-10-CM | POA: Diagnosis not present

## 2022-07-20 DIAGNOSIS — I87311 Chronic venous hypertension (idiopathic) with ulcer of right lower extremity: Secondary | ICD-10-CM | POA: Diagnosis not present

## 2022-07-20 DIAGNOSIS — J9611 Chronic respiratory failure with hypoxia: Secondary | ICD-10-CM | POA: Diagnosis not present

## 2022-07-20 DIAGNOSIS — Z6833 Body mass index (BMI) 33.0-33.9, adult: Secondary | ICD-10-CM

## 2022-07-20 DIAGNOSIS — I110 Hypertensive heart disease with heart failure: Secondary | ICD-10-CM | POA: Diagnosis not present

## 2022-07-20 DIAGNOSIS — Z9981 Dependence on supplemental oxygen: Secondary | ICD-10-CM

## 2022-07-20 DIAGNOSIS — L89312 Pressure ulcer of right buttock, stage 2: Secondary | ICD-10-CM | POA: Diagnosis not present

## 2022-07-20 DIAGNOSIS — Z7982 Long term (current) use of aspirin: Secondary | ICD-10-CM

## 2022-07-20 DIAGNOSIS — I5033 Acute on chronic diastolic (congestive) heart failure: Secondary | ICD-10-CM | POA: Diagnosis not present

## 2022-07-20 DIAGNOSIS — I714 Abdominal aortic aneurysm, without rupture, unspecified: Secondary | ICD-10-CM | POA: Diagnosis not present

## 2022-07-20 DIAGNOSIS — E559 Vitamin D deficiency, unspecified: Secondary | ICD-10-CM | POA: Diagnosis not present

## 2022-07-20 DIAGNOSIS — Z87891 Personal history of nicotine dependence: Secondary | ICD-10-CM

## 2022-07-20 DIAGNOSIS — Z87440 Personal history of urinary (tract) infections: Secondary | ICD-10-CM

## 2022-07-20 DIAGNOSIS — J449 Chronic obstructive pulmonary disease, unspecified: Secondary | ICD-10-CM | POA: Diagnosis not present

## 2022-07-20 NOTE — Telephone Encounter (Signed)
I need your help and I do not know what options are available.  [place] and it is expensive for him to get ambulance transport here to the clinic.  I need your help with options.  Thanks.

## 2022-07-21 ENCOUNTER — Other Ambulatory Visit: Payer: Self-pay | Admitting: Internal Medicine

## 2022-07-22 ENCOUNTER — Other Ambulatory Visit: Payer: Self-pay | Admitting: Internal Medicine

## 2022-07-29 ENCOUNTER — Inpatient Hospital Stay: Payer: PPO | Attending: Oncology

## 2022-07-29 ENCOUNTER — Inpatient Hospital Stay: Payer: PPO

## 2022-08-01 ENCOUNTER — Telehealth: Payer: Self-pay | Admitting: Family Medicine

## 2022-08-01 MED ORDER — FUROSEMIDE 40 MG PO TABS: 40.0000 mg | ORAL_TABLET | Freq: Every day | ORAL | 2 refills | Status: DC

## 2022-08-01 NOTE — Telephone Encounter (Signed)
Erx sent

## 2022-08-01 NOTE — Telephone Encounter (Signed)
Prescription Request  08/01/2022  Is this a "Controlled Substance" medicine? No  LOV: 02/11/2022  What is the name of the medication or equipment? furosemide (LASIX) 40 MG tablet   Have you contacted your pharmacy to request a refill? No   Which pharmacy would you like this sent to?  [id number] [address] [id number]: [phone]: [phone]   Patient notified that their request is being sent to the clinical staff for review and that they should receive a response within 2 business days.   Please advise at [facility]

## 2022-08-06 ENCOUNTER — Telehealth: Payer: Self-pay | Admitting: Family Medicine

## 2022-08-06 NOTE — Telephone Encounter (Signed)
Order was sent to [facility]. I am sending a message to f/u on order.

## 2022-08-06 NOTE — Telephone Encounter (Signed)
Pt's wife, [name], called stating she was told by the pt's [facility] service, [facility], that a order was requested for a wheel chair awhile ago but never got approved by Damita Dunnings. [name] is asking for an update on order? Call back # [phone]

## 2022-08-06 NOTE — Telephone Encounter (Signed)
[facility] said they can no longer accept the order that is in EMR from 10/09/21; new order has to be done with recent ov notes. Called patients wife back and explained that the order will need to be redone face to face visit is needed. She scheduled a virtual visit on 08/14/22 at 4 pm.

## 2022-08-06 NOTE — Telephone Encounter (Signed)
Erline Levine from [place] called to let Damita Dunnings know she visited the pt today, 2/21 & he's developed a skin issue on leg. Erline Levine stated the skin issue is a red rash along with a scale appearance. Erline Levine wanted to ask Damita Dunnings if it was okay to get nursing out to the pt to eval & treat the issue? Call back # [phone]

## 2022-08-06 NOTE — Telephone Encounter (Signed)
Yes, please give the order.  Thanks.

## 2022-08-07 MED ORDER — PREDNISONE 10 MG PO TABS: ORAL_TABLET | ORAL | 0 refills | Status: DC

## 2022-08-07 NOTE — Telephone Encounter (Signed)
Mark from [place] called stating he's currently with the pt & the pt is experiencing some extreme pain in feet & toes. Elta Guadeloupe states the pt cannot stand or even walk & is asking [place] there some relief the pt can be given for the issue? Elta Guadeloupe states it could be possibly gout. Call back # [phone].

## 2022-08-07 NOTE — Addendum Note (Signed)
Addended by: Tonia Ghent on: 08/07/2022 05:15 PM   Modules accepted: Orders

## 2022-08-07 NOTE — Telephone Encounter (Signed)
Verbal orders given to [facility] at [employer]

## 2022-08-07 NOTE — Telephone Encounter (Signed)
Called and spoke with patients wife and advised on below. [name] [name] verbalized understanding and thanked [place].

## 2022-08-07 NOTE — Telephone Encounter (Signed)
If this is possibly gout, he could try prednisone course with food.  I can't be sure about the dx w/o seeing him and I think he needs in person eval when possible.

## 2022-08-11 NOTE — Telephone Encounter (Signed)
Please give the order.  Thanks.   

## 2022-08-11 NOTE — Telephone Encounter (Signed)
Stacy from [place] home health call requesting to increase patient PT to 2 times a week due to decline # secure line [phone]

## 2022-08-11 NOTE — Telephone Encounter (Signed)
Stacy from [place] home health given orders. Will call if any questions.

## 2022-08-12 DIAGNOSIS — J449 Chronic obstructive pulmonary disease, unspecified: Secondary | ICD-10-CM | POA: Diagnosis not present

## 2022-08-12 DIAGNOSIS — J9611 Chronic respiratory failure with hypoxia: Secondary | ICD-10-CM | POA: Diagnosis not present

## 2022-08-13 DIAGNOSIS — G4733 Obstructive sleep apnea (adult) (pediatric): Secondary | ICD-10-CM | POA: Diagnosis not present

## 2022-08-13 NOTE — Telephone Encounter (Addendum)
Mark from [place] called to let Damita Dunnings know the pt is experiencing 9/10 pain from his foot. Elta Guadeloupe states he cannot even gate train with the pt because he can't stand up from pain. Elta Guadeloupe is asking if Damita Dunnings can prescribe something for the pain? [id number] [place] Call back # [phone]

## 2022-08-13 NOTE — Telephone Encounter (Signed)
Noted.  Thanks.  And unfortunately he may need EMS transport if his pain is worse in the meantime.

## 2022-08-13 NOTE — Telephone Encounter (Signed)
We are doing video visit with patient tomorrow. Wife says they can not get patient to the office in the condition he is in nor anywhere else. This is why the wheelchair need is so great right now. Patients daughter will be present for the video visit tomorrow and can discuss everything then.

## 2022-08-13 NOTE — Telephone Encounter (Signed)
I think this patient needs to be seen in person.  Unfortunately I am not in clinic today and he may end up needing urgent evaluation this afternoon/tonight.

## 2022-08-14 ENCOUNTER — Telehealth (INDEPENDENT_AMBULATORY_CARE_PROVIDER_SITE_OTHER): Payer: PPO | Admitting: Family Medicine

## 2022-08-14 ENCOUNTER — Encounter: Payer: Self-pay | Admitting: Family Medicine

## 2022-08-14 VITALS — BP 122/70 | HR 84 | Ht 70.0 in

## 2022-08-14 DIAGNOSIS — R2689 Other abnormalities of gait and mobility: Secondary | ICD-10-CM | POA: Diagnosis not present

## 2022-08-14 DIAGNOSIS — R269 Unspecified abnormalities of gait and mobility: Secondary | ICD-10-CM

## 2022-08-14 DIAGNOSIS — D649 Anemia, unspecified: Secondary | ICD-10-CM

## 2022-08-14 DIAGNOSIS — M79673 Pain in unspecified foot: Secondary | ICD-10-CM | POA: Diagnosis not present

## 2022-08-14 MED ORDER — GABAPENTIN 100 MG PO CAPS: 100.0000 mg | ORAL_CAPSULE | Freq: Three times a day (TID) | ORAL | 3 refills | Status: DC | PRN

## 2022-08-14 NOTE — Progress Notes (Signed)
Virtual visit completed through WebEx or similar program Patient location: home  Provider location: [place] at [facility], office  Participants: Patient and me (unless stated otherwise below)  Limitations and rationale for visit method d/w patient.  Patient agreed to proceed.   CC: follow up.    HPI:  He is off iron in the meantime.  Stools are no longer black.  Needs f/u labs when possible, hopefully at home.  Discussed.  Gait changes/fall risk/deconditioning. Needs order for lightweight wheelchair.  [facility].    Foot pain.  Can't put weight on L foot.  No drainage or pus at the foot.  L toes bent in flexion.  Skin ttp on the foot.  Skin blanches with quick refill on the foot-family demonstrated this during the call. Skin doesn't look red or infected. Prednisone helped some, R foot pain is better.    Rash on R foot, dorsum, scaly.  Skin blanches normally.  No R ankle edema.  Not itchy.  No pain.  R toes are in flexion at baseline.  R foot not ttp.  Rash looks some better from 1 week ago based on family report.  Presumed neuropathy.  He has altered sensation in B feet at baseline.   Sx worse since prev inpatient course.   He has burning sensation in the L foot.    Meds and allergies reviewed.   ROS: Per HPI unless specifically indicated in ROS section   NAD Speech wnl Using O2 via nasal cannula.  Sitting upright. No ulceration seen on the feet but he does have a scaly rash on the right foot.  He has blanching skin perfusion with rapid capillary refill demonstrated.  A/P:  History of anemia, off iron in the meantime.  Discussed trying to get labs set up at home.  See following phone note.  Gait abnormality.  Will try to arrange for lightweight wheelchair at home.  Uses [facility].  Foot pain.  Presumed neuropathy.  Discussed options.  Could have had previous gout flare.  At this point reasonable to finish his current prescription for prednisone and start gabapentin.   Gabapentin cautions discussed with patient.  The rash on his right foot has improved in the meantime and I think it makes sense to observe for now.  This does not look like cellulitis.

## 2022-08-15 DIAGNOSIS — F419 Anxiety disorder, unspecified: Secondary | ICD-10-CM | POA: Diagnosis not present

## 2022-08-15 DIAGNOSIS — E559 Vitamin D deficiency, unspecified: Secondary | ICD-10-CM | POA: Diagnosis not present

## 2022-08-15 DIAGNOSIS — D500 Iron deficiency anemia secondary to blood loss (chronic): Secondary | ICD-10-CM | POA: Diagnosis not present

## 2022-08-15 DIAGNOSIS — G4733 Obstructive sleep apnea (adult) (pediatric): Secondary | ICD-10-CM | POA: Diagnosis not present

## 2022-08-15 DIAGNOSIS — I714 Abdominal aortic aneurysm, without rupture, unspecified: Secondary | ICD-10-CM | POA: Diagnosis not present

## 2022-08-15 DIAGNOSIS — J9611 Chronic respiratory failure with hypoxia: Secondary | ICD-10-CM | POA: Diagnosis not present

## 2022-08-15 DIAGNOSIS — I350 Nonrheumatic aortic (valve) stenosis: Secondary | ICD-10-CM | POA: Diagnosis not present

## 2022-08-15 DIAGNOSIS — J449 Chronic obstructive pulmonary disease, unspecified: Secondary | ICD-10-CM | POA: Diagnosis not present

## 2022-08-15 DIAGNOSIS — I110 Hypertensive heart disease with heart failure: Secondary | ICD-10-CM | POA: Diagnosis not present

## 2022-08-15 DIAGNOSIS — I5033 Acute on chronic diastolic (congestive) heart failure: Secondary | ICD-10-CM | POA: Diagnosis not present

## 2022-08-16 DIAGNOSIS — J449 Chronic obstructive pulmonary disease, unspecified: Secondary | ICD-10-CM | POA: Diagnosis not present

## 2022-08-16 DIAGNOSIS — J9610 Chronic respiratory failure, unspecified whether with hypoxia or hypercapnia: Secondary | ICD-10-CM | POA: Diagnosis not present

## 2022-08-16 DIAGNOSIS — M1711 Unilateral primary osteoarthritis, right knee: Secondary | ICD-10-CM | POA: Diagnosis not present

## 2022-08-17 ENCOUNTER — Telehealth: Payer: Self-pay | Admitting: Family Medicine

## 2022-08-17 DIAGNOSIS — R269 Unspecified abnormalities of gait and mobility: Secondary | ICD-10-CM | POA: Insufficient documentation

## 2022-08-17 DIAGNOSIS — M79673 Pain in unspecified foot: Secondary | ICD-10-CM | POA: Insufficient documentation

## 2022-08-17 NOTE — Assessment & Plan Note (Signed)
  Gait abnormality.  Will try to arrange for lightweight wheelchair at home.  Uses [facility].

## 2022-08-17 NOTE — Assessment & Plan Note (Signed)
Foot pain.  Presumed neuropathy.  Discussed options.  Could have had previous gout flare.  At this point reasonable to finish his current prescription for prednisone and start gabapentin.  Gabapentin cautions discussed with patient.  The rash on his right foot has improved in the meantime and I think it makes sense to observe for now.  This does not look like cellulitis.

## 2022-08-17 NOTE — Assessment & Plan Note (Signed)
History of anemia, off iron in the meantime.  Discussed trying to get labs set up at home.  See following phone note.

## 2022-08-17 NOTE — Telephone Encounter (Signed)
  History of anemia, off iron in the meantime.  Please see if it is possible to get labs set up at home. CMET/CBC/iron level/ferritin/B12.  Dx D64.9.   Gait abnormality.  Please send order for lightweight wheelchair to use at home.  Uses [facility].  R26.9.  Thanks.

## 2022-08-19 ENCOUNTER — Inpatient Hospital Stay: Payer: PPO | Attending: Oncology

## 2022-08-19 ENCOUNTER — Inpatient Hospital Stay: Payer: PPO

## 2022-08-19 ENCOUNTER — Telehealth: Payer: Self-pay | Admitting: Family Medicine

## 2022-08-19 DIAGNOSIS — M79603 Pain in arm, unspecified: Secondary | ICD-10-CM

## 2022-08-19 DIAGNOSIS — M79643 Pain in unspecified hand: Secondary | ICD-10-CM

## 2022-08-19 NOTE — Telephone Encounter (Signed)
I wouldn't expect the cramping to be from gabapentin.  See if he has any vertigo sx on gabapentin.  Did it help his foot pain?  Please let me know.    I need more details about his hand/arm pain.  Please see what you can find out.  Thanks.

## 2022-08-19 NOTE — Telephone Encounter (Signed)
Called and spoke to Shirlean Mylar and was advised that patient did not complain of any vertigo. Shirlean Mylar stated that patient complained of right arm pain and numbness in the right hand. Patient had a fall yesterday. Shirlean Mylar stated that patient fell trying to get back into the bed and family helped him in the bed. Shirlean Mylar stated that the wrist and thumb area is warm to the touch. Shirlean Mylar stated that the area seems to be real sensitive.  Shirlean Mylar and her supervisor tried to get him to go to the ER for evaluation but refused to go.

## 2022-08-19 NOTE — Telephone Encounter (Signed)
If sig pain, then rec ER eval.  If he doesn't opt for ER, then please give the order for plain films of R hand wrist and forearm.  Dx [id number], [id number]

## 2022-08-19 NOTE — Telephone Encounter (Signed)
Robin from [place] called in and stated that patient recently started Gabapentin. She stated that he fell yesterday afternoon going from wheelchair to bed. She stated that he is experiencing cramps in his leg and pain in his thumb up to his arm. She stated that he said it feels like his hand is going to sleep. She isn't sure if any of this is from the fall or from the Gabapentin. She was wanting to know if there was a possibility for a mobile x-ray as patient is bed bound. Please advise. Thank you!

## 2022-08-19 NOTE — Telephone Encounter (Signed)
Orders for labs have been faxed to [facility]. DME order has been done for the wheelchair and sent to [facility].

## 2022-08-19 NOTE — Addendum Note (Signed)
Addended by: Sherrilee Gilles B on: 08/19/2022 03:39 PM   Modules accepted: Orders

## 2022-08-20 NOTE — Telephone Encounter (Signed)
[name] [name] notified as instructed by telephone and verbalized understanding. [name] [name] stated that she does not think that they are able to reach out to a mobile x-ray company to go out and do an x-ray. [name] [name] stated that she is going to call her home office and see if they are able to get a portable x-ray done. [name] [name] stated that patient needs more care than he is getting at home. [name] [name] stated that the patient denies any vertigo with the gabapentin. [name] [name] stated that patient said that the Gabapentin has helped with the foot pain. [name] [name] stated that [name] [name] legs  are almost cracking open and it looks like he has scratched them a lot. [name] [name] stated that the patient has been using lotion on his legs. [name] [name] stated that she advised the patient that he should try Vaseline on his legs. [name] [name] stated that patient is bed bound and the only way that he can go out and get an xray would be by EMS. [name] [name] stated that she is going to call back and let Dr. Damita Dunnings know if she can get a portable x-ray done at the patient's home.

## 2022-08-20 NOTE — Telephone Encounter (Addendum)
Left a message on voicemail for Robin to call the office back. See if he has any vertigo sx on gabapentin. Did it help his foot pain? Please let me know.

## 2022-08-20 NOTE — Telephone Encounter (Signed)
Noted.  Thanks.  Jessica-please talk to me about this patient.  My concern is that he is going to need escalating care.  Is the patient willing to move temporarily to get escalated care?  Is the patient/family willing to talk with social work about options?  Are they willing to talk to me about his level of care and ongoing needs via video visit?

## 2022-08-20 NOTE — Telephone Encounter (Signed)
Neoma Laming called back stating that she talked with her dad and was told that his hand is doing much better today. Neoma Laming stated that she hasn't gotten to her dad's home yet but talked with him on the phone. Neoma Laming stated that her dad told her that he was not able to pull up on the bar on his bed yesterday but can today. Neoma Laming stated that her dad told her that he put ice on his hand which helped and it is not swollen today.  Neoma Laming stated that her dad and mom do not feel that his hand is broken. Neoma Laming stated that her dad did not take a hard fall. Neoma Laming stated that PT put him in the wheelchair yesterday and told him to try to get into the bed by himself which he did. Neoma Laming stated that PT just left him in the wheelchair and she will be talking to them about that. Neoma Laming stated that she will call back if she gets to her dad's house and she thinks that he needs an x-ray. Neoma Laming was advised to call the office back if his hand does not continue to improve.

## 2022-08-20 NOTE — Telephone Encounter (Signed)
[name] [name]  with home health called back and stated that Dr. Damita Dunnings would need to do an order for the x-ray and fax to [place] at [phone].   Called and spoke to patient's daughter Neoma Laming and was advised that she is on her way to her dad's house now. Neoma Laming stated that they do not feel that his hand is broken. Neoma Laming stated she will call the office back and give an update on her dad's hand when she gets to his home.

## 2022-08-21 NOTE — Telephone Encounter (Signed)
Did you talk to patients daughter about this?

## 2022-08-22 NOTE — Telephone Encounter (Signed)
I did talk with daughter about this.  She has talked with family about level of care but didn't think patient was willing to make/consider a change at this point. I offered to talk with patient/family.  They can update me as needed.  I thanked her for her effort and she appreciated the offer.

## 2022-08-23 DIAGNOSIS — G4733 Obstructive sleep apnea (adult) (pediatric): Secondary | ICD-10-CM | POA: Diagnosis not present

## 2022-08-26 NOTE — Telephone Encounter (Signed)
Received notification from [facility] that he does not have skilled nursing involved in his care and they can not do labs for patient.

## 2022-08-26 NOTE — Telephone Encounter (Addendum)
Please update family.  He is going to need labs.  Please see about setting up an office visit here.  It needs to be set when the lab will be open, ie not a 4pm visit.  Thanks.

## 2022-08-26 NOTE — Telephone Encounter (Signed)
Called patients daughter and scheduled a visit for 08/29/22 at 2:00 pm. Advised her if there are any issues with that day and time with transportation getting him here then okay to call back to reschedule to next week. Advised her that we can not do a 4:00 pm with him due to lab closing early and she verbalized understanding.

## 2022-08-29 ENCOUNTER — Ambulatory Visit: Payer: PPO | Admitting: Family Medicine

## 2022-08-29 ENCOUNTER — Telehealth: Payer: Self-pay | Admitting: Family Medicine

## 2022-08-29 NOTE — Telephone Encounter (Signed)
Daughter will call back on Monday to reschedule

## 2022-08-29 NOTE — Telephone Encounter (Signed)
Pt's daughter called to cancel the pt's appt for today, 3/15 due the pt experiencing diarrhea. Pt's daughter asked when should the pt r/s? Call back #  [phone]

## 2022-08-29 NOTE — Telephone Encounter (Signed)
Patient needs to be rescheduled as soon as they can get him in to be seen. Patient has to be seen in person; no virtual.

## 2022-09-02 DIAGNOSIS — R269 Unspecified abnormalities of gait and mobility: Secondary | ICD-10-CM | POA: Diagnosis not present

## 2022-09-02 NOTE — Telephone Encounter (Signed)
Patient has been rescheduled for 09/04/22

## 2022-09-04 ENCOUNTER — Ambulatory Visit (INDEPENDENT_AMBULATORY_CARE_PROVIDER_SITE_OTHER): Payer: PPO | Admitting: Family Medicine

## 2022-09-04 ENCOUNTER — Encounter: Payer: Self-pay | Admitting: Family Medicine

## 2022-09-04 VITALS — BP 102/56 | HR 88 | Temp 97.2°F | Ht 70.0 in

## 2022-09-04 DIAGNOSIS — J9611 Chronic respiratory failure with hypoxia: Secondary | ICD-10-CM

## 2022-09-04 DIAGNOSIS — D649 Anemia, unspecified: Secondary | ICD-10-CM

## 2022-09-04 DIAGNOSIS — R21 Rash and other nonspecific skin eruption: Secondary | ICD-10-CM

## 2022-09-04 DIAGNOSIS — E559 Vitamin D deficiency, unspecified: Secondary | ICD-10-CM

## 2022-09-04 DIAGNOSIS — G629 Polyneuropathy, unspecified: Secondary | ICD-10-CM

## 2022-09-04 DIAGNOSIS — M79673 Pain in unspecified foot: Secondary | ICD-10-CM

## 2022-09-04 MED ORDER — TRIAMCINOLONE ACETONIDE 0.5 % EX CREA: 1.0000 | TOPICAL_CREAM | Freq: Two times a day (BID) | CUTANEOUS | 1 refills | Status: DC | PRN

## 2022-09-04 NOTE — Patient Instructions (Signed)
Go to the lab on the way out.   If you have mychart we'll likely use that to update you.    Use TAC 0.5% if needed on the rash.  Take care.  Glad to see you.

## 2022-09-04 NOTE — Progress Notes (Signed)
In wheelchair and using O2 via [place].    History of anemia.  See follow-up labs.  Mildly lightheaded.  Prev BP was SBP 148 earlier today.   Recheck SpO2 98% at 4L cont.   He isn't getting coverage for symbicort now. He isn't out yet.  See following phone note.  Chronic BLE changes but clearly less irritated than prior.  No ulceration.   B 1st toes in chronic plantarflexion.  Intact DP pulses B w/o ulceration.  Altered sensation BLE.  No obvious gouty changes. Done with prednisone course in the meantime.  He still has home health PT ongoing, given his baseline gait abnormality.  Discussed that he could have chronic changes in the feet related to neuropathy.  We talked about his level of care at home.  He wants to stay at home.  Encouraged him to have a back up plan re: inc in illness, ie for him to d/w his family.  I can potentially help facilitate that conversation in the future if needed.  He has an itchy rash on the upper chest wall.  Meds, vitals, and allergies reviewed.   ROS: Per HPI unless specifically indicated in ROS section   Nad In wheelchair, on O2 via nasal cannula. Neck supple, no LA Rrr Ctab Abd soft, not ttp He has chronic thickening/irritation on the shins bilaterally without ulceration, less so on the dorsum of the feet.  His legs clearly look better than last time I saw him in person.  He has intact dorsalis pedis pulses bilaterally with decreased sensation on the feet.  Bilateral first toes are fixed in plantarflexion at the IP joint. He has a faint blanching nonulcerated superficial macular nondermatomal minimally erythematous rash on the upper chest wall.  30 minutes were devoted to patient care in this encounter (this includes time spent reviewing the patient's file/history, interviewing and examining the patient, counseling/reviewing plan with patient).

## 2022-09-05 ENCOUNTER — Telehealth: Payer: Self-pay | Admitting: Family Medicine

## 2022-09-05 ENCOUNTER — Other Ambulatory Visit: Payer: Self-pay | Admitting: Family Medicine

## 2022-09-05 DIAGNOSIS — G629 Polyneuropathy, unspecified: Secondary | ICD-10-CM | POA: Insufficient documentation

## 2022-09-05 DIAGNOSIS — R269 Unspecified abnormalities of gait and mobility: Secondary | ICD-10-CM

## 2022-09-05 DIAGNOSIS — E039 Hypothyroidism, unspecified: Secondary | ICD-10-CM

## 2022-09-05 DIAGNOSIS — I5032 Chronic diastolic (congestive) heart failure: Secondary | ICD-10-CM

## 2022-09-05 DIAGNOSIS — J9611 Chronic respiratory failure with hypoxia: Secondary | ICD-10-CM

## 2022-09-05 DIAGNOSIS — E559 Vitamin D deficiency, unspecified: Secondary | ICD-10-CM

## 2022-09-05 LAB — CBC WITH DIFFERENTIAL/PLATELET
Basophils Absolute: 0.1 10*3/uL (ref 0.0–0.1)
Basophils Relative: 1.1 % (ref 0.0–3.0)
Eosinophils Absolute: 0.8 10*3/uL — ABNORMAL HIGH (ref 0.0–0.7)
Eosinophils Relative: 10.2 % — ABNORMAL HIGH (ref 0.0–5.0)
HCT: 30.2 % — ABNORMAL LOW (ref 39.0–52.0)
Hemoglobin: 9.7 g/dL — ABNORMAL LOW (ref 13.0–17.0)
Lymphocytes Relative: 18.9 % (ref 12.0–46.0)
Lymphs Abs: 1.5 10*3/uL (ref 0.7–4.0)
MCHC: 32 g/dL (ref 30.0–36.0)
MCV: 96.3 fl (ref 78.0–100.0)
Monocytes Absolute: 0.4 10*3/uL (ref 0.1–1.0)
Monocytes Relative: 5.2 % (ref 3.0–12.0)
Neutro Abs: 5.3 10*3/uL (ref 1.4–7.7)
Neutrophils Relative %: 64.6 % (ref 43.0–77.0)
Platelets: 395 10*3/uL (ref 150.0–400.0)
RBC: 3.13 Mil/uL — ABNORMAL LOW (ref 4.22–5.81)
RDW: 15.4 % (ref 11.5–15.5)
WBC: 8.2 10*3/uL (ref 4.0–10.5)

## 2022-09-05 LAB — COMPREHENSIVE METABOLIC PANEL
ALT: 9 U/L (ref 0–53)
AST: 17 U/L (ref 0–37)
Albumin: 3.4 g/dL — ABNORMAL LOW (ref 3.5–5.2)
Alkaline Phosphatase: 145 U/L — ABNORMAL HIGH (ref 39–117)
BUN: 25 mg/dL — ABNORMAL HIGH (ref 6–23)
CO2: 45 mEq/L — ABNORMAL HIGH (ref 19–32)
Calcium: 9.3 mg/dL (ref 8.4–10.5)
Chloride: 91 mEq/L — ABNORMAL LOW (ref 96–112)
Creatinine, Ser: 1.16 mg/dL (ref 0.40–1.50)
GFR: 56.83 mL/min — ABNORMAL LOW (ref >60.00)
Glucose, Bld: 102 mg/dL — ABNORMAL HIGH (ref 70–99)
Potassium: 4.3 mEq/L (ref 3.5–5.1)
Sodium: 143 mEq/L (ref 135–145)
Total Bilirubin: 0.4 mg/dL (ref 0.2–1.2)
Total Protein: 6.9 g/dL (ref 6.0–8.3)

## 2022-09-05 LAB — FERRITIN: Ferritin: 69.6 ng/mL (ref 22.0–322.0)

## 2022-09-05 LAB — VITAMIN B12: Vitamin B-12: 533 pg/mL (ref 211–911)

## 2022-09-05 LAB — TSH: TSH: 7 u[IU]/mL — ABNORMAL HIGH (ref 0.35–5.50)

## 2022-09-05 LAB — IRON: Iron: 41 ug/dL — ABNORMAL LOW (ref 42–165)

## 2022-09-05 LAB — VITAMIN D 25 HYDROXY (VIT D DEFICIENCY, FRACTURES): VITD: 14.1 ng/mL — ABNORMAL LOW (ref 30.00–100.00)

## 2022-09-05 LAB — URIC ACID: Uric Acid, Serum: 9 mg/dL — ABNORMAL HIGH (ref 4.0–7.8)

## 2022-09-05 MED ORDER — VITAMIN D (ERGOCALCIFEROL) 1.25 MG (50000 UNIT) PO CAPS: ORAL_CAPSULE | ORAL | 0 refills | Status: DC

## 2022-09-05 MED ORDER — LEVOTHYROXINE SODIUM 25 MCG PO TABS: 25.0000 ug | ORAL_TABLET | Freq: Every day | ORAL | 3 refills | Status: DC

## 2022-09-05 NOTE — Telephone Encounter (Signed)
Daughter brought him in to [place] yesterday.

## 2022-09-05 NOTE — Telephone Encounter (Signed)
He isn't getting coverage for symbicort now. He isn't out yet.  Can you start a PA on this?  Using [facility].  Thanks.

## 2022-09-05 NOTE — Assessment & Plan Note (Signed)
Continue O2 as is for now.  See notes on labs.  We will check on Symbicort coverage.  See following phone note.  Discussed his level of care at home.  See above.

## 2022-09-05 NOTE — Telephone Encounter (Signed)
Patient wife called in and stated that [name] [name] is needing a new mattress. [employer] stated that he need a gel overlay for his mattress in order for the mattress to last longer. She stated they are needing a call in order to add it. Please advise. Thank you!

## 2022-09-05 NOTE — Telephone Encounter (Signed)
Order has been placed and message sent to [employer] team.

## 2022-09-05 NOTE — Assessment & Plan Note (Signed)
Footcare discussed with patient.  Continue gabapentin for now.  See notes on labs.  He could have some chronic architectural changes in the foot related to neuropathy/altered sensation.

## 2022-09-05 NOTE — Assessment & Plan Note (Signed)
Use triamcinolone on the rash and update me if not improved.

## 2022-09-05 NOTE — Assessment & Plan Note (Signed)
History of anemia.  See follow-up labs.

## 2022-09-05 NOTE — Telephone Encounter (Signed)
Please give the order.  Thanks.   

## 2022-09-08 ENCOUNTER — Telehealth: Payer: Self-pay | Admitting: Family Medicine

## 2022-09-08 ENCOUNTER — Telehealth: Payer: Self-pay | Admitting: Oncology

## 2022-09-08 NOTE — Telephone Encounter (Signed)
Please give the order.  Thanks.   

## 2022-09-08 NOTE — Telephone Encounter (Signed)
Home Health verbal orders Caller Name:Stacy Agency Name: [facility] number: [phone]  Requesting OT/PT/Skilled nursing/Social Work/Speech: Continue PT  Reason:transfers,standing ,walking  Frequency:1 wk 1 2wk3 1wk5  Please forward to [facility] pool or providers CMA

## 2022-09-08 NOTE — Telephone Encounter (Signed)
Verbal orders given to stacy at [facility].

## 2022-09-08 NOTE — Telephone Encounter (Signed)
Patients daughter called to cancel appointments for tomorrow and states they will call back to reschedule

## 2022-09-09 ENCOUNTER — Other Ambulatory Visit (HOSPITAL_COMMUNITY): Payer: Self-pay

## 2022-09-09 ENCOUNTER — Inpatient Hospital Stay: Payer: PPO | Admitting: Oncology

## 2022-09-09 ENCOUNTER — Inpatient Hospital Stay: Payer: PPO

## 2022-09-09 ENCOUNTER — Encounter: Payer: Self-pay | Admitting: Oncology

## 2022-09-09 NOTE — Telephone Encounter (Signed)
Pharmacy Patient Advocate Encounter   Received notification that prior authorization for Symbicort 160/4.43mcg is required/requested.  Per Test Claim: Product not on formulary   PA submitted on 09/09/22 to (ins) [employer] via CoverMyMeds Key # [id number] Status is pending

## 2022-09-10 DIAGNOSIS — J449 Chronic obstructive pulmonary disease, unspecified: Secondary | ICD-10-CM | POA: Diagnosis not present

## 2022-09-10 DIAGNOSIS — J9611 Chronic respiratory failure with hypoxia: Secondary | ICD-10-CM | POA: Diagnosis not present

## 2022-09-12 DIAGNOSIS — R2689 Other abnormalities of gait and mobility: Secondary | ICD-10-CM | POA: Diagnosis not present

## 2022-09-12 NOTE — Telephone Encounter (Signed)
Patient Advocate Encounter  Prior Authorization for Symbicort 160/4.4mcg  has been approved.    PA# [id number] Effective dates: 09/09/22 through 06/16/23

## 2022-09-15 ENCOUNTER — Telehealth: Payer: Self-pay | Admitting: Family Medicine

## 2022-09-15 DIAGNOSIS — J9611 Chronic respiratory failure with hypoxia: Secondary | ICD-10-CM | POA: Diagnosis not present

## 2022-09-15 DIAGNOSIS — J449 Chronic obstructive pulmonary disease, unspecified: Secondary | ICD-10-CM | POA: Diagnosis not present

## 2022-09-15 DIAGNOSIS — E559 Vitamin D deficiency, unspecified: Secondary | ICD-10-CM | POA: Diagnosis not present

## 2022-09-15 DIAGNOSIS — G4733 Obstructive sleep apnea (adult) (pediatric): Secondary | ICD-10-CM | POA: Diagnosis not present

## 2022-09-15 DIAGNOSIS — I110 Hypertensive heart disease with heart failure: Secondary | ICD-10-CM | POA: Diagnosis not present

## 2022-09-15 DIAGNOSIS — F419 Anxiety disorder, unspecified: Secondary | ICD-10-CM | POA: Diagnosis not present

## 2022-09-15 DIAGNOSIS — D500 Iron deficiency anemia secondary to blood loss (chronic): Secondary | ICD-10-CM | POA: Diagnosis not present

## 2022-09-15 DIAGNOSIS — I714 Abdominal aortic aneurysm, without rupture, unspecified: Secondary | ICD-10-CM | POA: Diagnosis not present

## 2022-09-15 DIAGNOSIS — I5033 Acute on chronic diastolic (congestive) heart failure: Secondary | ICD-10-CM | POA: Diagnosis not present

## 2022-09-15 DIAGNOSIS — I350 Nonrheumatic aortic (valve) stenosis: Secondary | ICD-10-CM | POA: Diagnosis not present

## 2022-09-15 NOTE — Telephone Encounter (Signed)
Called Mark and gave verbal orders for [facility] nursing eval and Dr. Josefine Class recommendations for wound.

## 2022-09-15 NOTE — Telephone Encounter (Signed)
Please add on [facility] RN for eval, would dressing.  Would keep clean and covered in the meantime.  Please update me if not improving, if fever, if spreading redness.  It may resolve with local wound care.  Thanks.

## 2022-09-15 NOTE — Telephone Encounter (Signed)
Mark from [place] called over and stated that Cullin had a boil under his right armpit for several years. He stated that it burst this morning and leaked out a bunch of smelly stuff. He stated that his pain is a 9 out of 10. He stated that it look likes there is still some pus and fluid in there. He was wanting to know if Dr. Damita Dunnings would like to take a look at it or have a home health nurse go out and take care of it. He is requesting to add [facility] nursing on also. Please advise. Thank you!

## 2022-09-16 DIAGNOSIS — M1711 Unilateral primary osteoarthritis, right knee: Secondary | ICD-10-CM | POA: Diagnosis not present

## 2022-09-16 DIAGNOSIS — J9610 Chronic respiratory failure, unspecified whether with hypoxia or hypercapnia: Secondary | ICD-10-CM | POA: Diagnosis not present

## 2022-09-16 DIAGNOSIS — J449 Chronic obstructive pulmonary disease, unspecified: Secondary | ICD-10-CM | POA: Diagnosis not present

## 2022-09-21 DIAGNOSIS — E559 Vitamin D deficiency, unspecified: Secondary | ICD-10-CM

## 2022-09-21 DIAGNOSIS — I110 Hypertensive heart disease with heart failure: Secondary | ICD-10-CM

## 2022-09-21 DIAGNOSIS — I714 Abdominal aortic aneurysm, without rupture, unspecified: Secondary | ICD-10-CM

## 2022-09-21 DIAGNOSIS — I5033 Acute on chronic diastolic (congestive) heart failure: Secondary | ICD-10-CM

## 2022-09-21 DIAGNOSIS — Z87891 Personal history of nicotine dependence: Secondary | ICD-10-CM

## 2022-09-21 DIAGNOSIS — J9611 Chronic respiratory failure with hypoxia: Secondary | ICD-10-CM

## 2022-09-21 DIAGNOSIS — I350 Nonrheumatic aortic (valve) stenosis: Secondary | ICD-10-CM

## 2022-09-21 DIAGNOSIS — Z87440 Personal history of urinary (tract) infections: Secondary | ICD-10-CM

## 2022-09-21 DIAGNOSIS — Z954 Presence of other heart-valve replacement: Secondary | ICD-10-CM

## 2022-09-21 DIAGNOSIS — G4733 Obstructive sleep apnea (adult) (pediatric): Secondary | ICD-10-CM

## 2022-09-21 DIAGNOSIS — Z7982 Long term (current) use of aspirin: Secondary | ICD-10-CM

## 2022-09-21 DIAGNOSIS — D500 Iron deficiency anemia secondary to blood loss (chronic): Secondary | ICD-10-CM

## 2022-09-21 DIAGNOSIS — F419 Anxiety disorder, unspecified: Secondary | ICD-10-CM

## 2022-09-21 DIAGNOSIS — L02421 Furuncle of right axilla: Secondary | ICD-10-CM

## 2022-09-21 DIAGNOSIS — Z9981 Dependence on supplemental oxygen: Secondary | ICD-10-CM

## 2022-09-21 DIAGNOSIS — Z6833 Body mass index (BMI) 33.0-33.9, adult: Secondary | ICD-10-CM

## 2022-09-21 DIAGNOSIS — J449 Chronic obstructive pulmonary disease, unspecified: Secondary | ICD-10-CM

## 2022-09-23 DIAGNOSIS — G4733 Obstructive sleep apnea (adult) (pediatric): Secondary | ICD-10-CM | POA: Diagnosis not present

## 2022-10-01 ENCOUNTER — Telehealth: Payer: Self-pay | Admitting: Family Medicine

## 2022-10-01 NOTE — Telephone Encounter (Signed)
Home Health verbal orders Caller Name:Mark Agency Name:[facility]   Callback number: [phone]  Requesting OT/PT/Skilled nursing/Social Work/Speech:-HH nursing wound care  -Order for walking boot that reduces weight bearing for left toe(can't put weight on toe,just the back of foot)   Reason:ulcer on left big toe 1cm by1cm  Frequency:  Please forward to [facility] pool or providers CMA

## 2022-10-01 NOTE — Telephone Encounter (Signed)
Please give the order.  Thanks.   

## 2022-10-01 NOTE — Telephone Encounter (Signed)
Verbal orders given to [facility] at [facility]

## 2022-10-02 ENCOUNTER — Other Ambulatory Visit: Payer: Self-pay | Admitting: Family Medicine

## 2022-10-03 ENCOUNTER — Telehealth: Payer: Self-pay | Admitting: Family Medicine

## 2022-10-03 DIAGNOSIS — R269 Unspecified abnormalities of gait and mobility: Secondary | ICD-10-CM | POA: Diagnosis not present

## 2022-10-03 NOTE — Telephone Encounter (Signed)
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

## 2022-10-03 NOTE — Telephone Encounter (Signed)
Called and advised Leslie with [facility] of the approval of the requested verbal orders for this patient. Advised to call back with any further questions.   

## 2022-10-03 NOTE — Telephone Encounter (Signed)
Please give the order.  Thanks.   

## 2022-10-07 DIAGNOSIS — I872 Venous insufficiency (chronic) (peripheral): Secondary | ICD-10-CM | POA: Diagnosis not present

## 2022-10-07 DIAGNOSIS — L89312 Pressure ulcer of right buttock, stage 2: Secondary | ICD-10-CM | POA: Diagnosis not present

## 2022-10-11 DIAGNOSIS — J9611 Chronic respiratory failure with hypoxia: Secondary | ICD-10-CM | POA: Diagnosis not present

## 2022-10-11 DIAGNOSIS — J449 Chronic obstructive pulmonary disease, unspecified: Secondary | ICD-10-CM | POA: Diagnosis not present

## 2022-10-13 DIAGNOSIS — R2689 Other abnormalities of gait and mobility: Secondary | ICD-10-CM | POA: Diagnosis not present

## 2022-10-16 DIAGNOSIS — M1711 Unilateral primary osteoarthritis, right knee: Secondary | ICD-10-CM | POA: Diagnosis not present

## 2022-10-16 DIAGNOSIS — J9610 Chronic respiratory failure, unspecified whether with hypoxia or hypercapnia: Secondary | ICD-10-CM | POA: Diagnosis not present

## 2022-10-16 DIAGNOSIS — J449 Chronic obstructive pulmonary disease, unspecified: Secondary | ICD-10-CM | POA: Diagnosis not present

## 2022-10-16 DIAGNOSIS — R0989 Other specified symptoms and signs involving the circulatory and respiratory systems: Secondary | ICD-10-CM | POA: Diagnosis not present

## 2022-10-23 DIAGNOSIS — G4733 Obstructive sleep apnea (adult) (pediatric): Secondary | ICD-10-CM | POA: Diagnosis not present

## 2022-11-02 DIAGNOSIS — R269 Unspecified abnormalities of gait and mobility: Secondary | ICD-10-CM | POA: Diagnosis not present

## 2022-11-04 ENCOUNTER — Telehealth: Payer: Self-pay | Admitting: Family Medicine

## 2022-11-04 NOTE — Telephone Encounter (Signed)
Home Health verbal orders Caller Name: Misty Stanley  Agency Name: [facility]   [place] number: [phone], may leave message   Requesting OT/PT/Skilled nursing/Social Work/Speech: To extend physical therapy    Reason: Balance, strength, transfers, wheelchair training, pre gait activity   Frequency: 2wk 5 1wk 3   Please forward to [facility] pool or providers CMA

## 2022-11-05 NOTE — Telephone Encounter (Signed)
Please give the order.  Thanks.   

## 2022-11-05 NOTE — Telephone Encounter (Signed)
Verbal orders have been given  

## 2022-11-10 DIAGNOSIS — I350 Nonrheumatic aortic (valve) stenosis: Secondary | ICD-10-CM | POA: Diagnosis not present

## 2022-11-10 DIAGNOSIS — F419 Anxiety disorder, unspecified: Secondary | ICD-10-CM | POA: Diagnosis not present

## 2022-11-10 DIAGNOSIS — L89894 Pressure ulcer of other site, stage 4: Secondary | ICD-10-CM | POA: Diagnosis not present

## 2022-11-10 DIAGNOSIS — I110 Hypertensive heart disease with heart failure: Secondary | ICD-10-CM | POA: Diagnosis not present

## 2022-11-10 DIAGNOSIS — J9611 Chronic respiratory failure with hypoxia: Secondary | ICD-10-CM | POA: Diagnosis not present

## 2022-11-10 DIAGNOSIS — E559 Vitamin D deficiency, unspecified: Secondary | ICD-10-CM | POA: Diagnosis not present

## 2022-11-10 DIAGNOSIS — J449 Chronic obstructive pulmonary disease, unspecified: Secondary | ICD-10-CM | POA: Diagnosis not present

## 2022-11-10 DIAGNOSIS — I714 Abdominal aortic aneurysm, without rupture, unspecified: Secondary | ICD-10-CM | POA: Diagnosis not present

## 2022-11-10 DIAGNOSIS — Z954 Presence of other heart-valve replacement: Secondary | ICD-10-CM | POA: Diagnosis not present

## 2022-11-10 DIAGNOSIS — Z7982 Long term (current) use of aspirin: Secondary | ICD-10-CM

## 2022-11-10 DIAGNOSIS — Z87891 Personal history of nicotine dependence: Secondary | ICD-10-CM

## 2022-11-10 DIAGNOSIS — Z87440 Personal history of urinary (tract) infections: Secondary | ICD-10-CM

## 2022-11-10 DIAGNOSIS — Z6833 Body mass index (BMI) 33.0-33.9, adult: Secondary | ICD-10-CM

## 2022-11-10 DIAGNOSIS — G4733 Obstructive sleep apnea (adult) (pediatric): Secondary | ICD-10-CM | POA: Diagnosis not present

## 2022-11-10 DIAGNOSIS — Z9981 Dependence on supplemental oxygen: Secondary | ICD-10-CM

## 2022-11-10 DIAGNOSIS — D500 Iron deficiency anemia secondary to blood loss (chronic): Secondary | ICD-10-CM | POA: Diagnosis not present

## 2022-11-10 DIAGNOSIS — I5033 Acute on chronic diastolic (congestive) heart failure: Secondary | ICD-10-CM | POA: Diagnosis not present

## 2022-11-11 ENCOUNTER — Other Ambulatory Visit: Payer: Self-pay | Admitting: Family Medicine

## 2022-11-12 DIAGNOSIS — R2689 Other abnormalities of gait and mobility: Secondary | ICD-10-CM | POA: Diagnosis not present

## 2022-11-13 ENCOUNTER — Encounter: Payer: Self-pay | Admitting: Family Medicine

## 2022-11-14 ENCOUNTER — Other Ambulatory Visit: Payer: Self-pay | Admitting: Family Medicine

## 2022-11-14 DIAGNOSIS — G4733 Obstructive sleep apnea (adult) (pediatric): Secondary | ICD-10-CM | POA: Diagnosis not present

## 2022-11-14 MED ORDER — LORAZEPAM 1 MG PO TABS: ORAL_TABLET | ORAL | 0 refills | Status: DC

## 2022-11-14 NOTE — Telephone Encounter (Signed)
LOV - 09/04/22 NOV - 12/08/22 RF - not really sure the last night it was actually filled

## 2022-11-14 NOTE — Telephone Encounter (Signed)
Pt daughter called in to know status of refill . Stated she is going out of town and would like to have RX refill for pt # [phone]

## 2022-11-16 DIAGNOSIS — J9610 Chronic respiratory failure, unspecified whether with hypoxia or hypercapnia: Secondary | ICD-10-CM | POA: Diagnosis not present

## 2022-11-16 DIAGNOSIS — J449 Chronic obstructive pulmonary disease, unspecified: Secondary | ICD-10-CM | POA: Diagnosis not present

## 2022-11-16 DIAGNOSIS — M1711 Unilateral primary osteoarthritis, right knee: Secondary | ICD-10-CM | POA: Diagnosis not present

## 2022-11-21 ENCOUNTER — Other Ambulatory Visit: Payer: Self-pay | Admitting: Family Medicine

## 2022-11-21 DIAGNOSIS — D649 Anemia, unspecified: Secondary | ICD-10-CM

## 2022-11-21 NOTE — Telephone Encounter (Signed)
Refill request for Vit D 50000 unit caps  LOV - 09/04/22 Next OV - 12/08/22 Last refill - 09/05/22 #12/0

## 2022-11-23 DIAGNOSIS — G4733 Obstructive sleep apnea (adult) (pediatric): Secondary | ICD-10-CM | POA: Diagnosis not present

## 2022-11-23 NOTE — Telephone Encounter (Signed)
Needs recheck labs prior to refill on this.  I put in the f/u orders.  We can do them at the upcoming OV if needed (12/08/22).  Thanks.

## 2022-11-28 ENCOUNTER — Telehealth: Payer: Self-pay | Admitting: Family Medicine

## 2022-11-28 ENCOUNTER — Other Ambulatory Visit: Payer: Self-pay | Admitting: Family Medicine

## 2022-11-28 MED ORDER — LOPERAMIDE HCL 2 MG PO TABS: 2.0000 mg | ORAL_TABLET | Freq: Four times a day (QID) | ORAL | Status: DC | PRN

## 2022-11-28 NOTE — Telephone Encounter (Signed)
I spoke with [name] [name] PT [facility] and today T 97.1, P 84, BP 130/70 98% with pt on 5L oxygen I spoke with pt .since 11/26/22 pt has had continuous watery diarrhea; no blood and no abd pain. When asked how many times had diarrhea today pt said as soon as he cleans himself up the diarrhea starts again. Could be different things causing this. Pt has not tried taking anything for diarrhea. Pt does not want to go to ED but will take [name] suggestion and will try OTC immodium and if that does not help pt will go to ED.for eval and testing. Pt will call first of next wk with update. in  regards to sleeping pt said has not slept well for long time. Pt is more concerned with diarrhea.last normal BM was 4 - 5 days ago.I did speak with pts daughter who said she was there earlier with pt and knows he had diarrhea but does not know details and advised that I speak with pt. Which I did. Sending note to Dr Para March.

## 2022-11-28 NOTE — Telephone Encounter (Signed)
Please triage patient.  Thanks!

## 2022-11-28 NOTE — Telephone Encounter (Signed)
PT [name] [name] from [facility] called to report that patient has been having diarrhea for 3 days,and he's having a hard time sleeping.

## 2022-11-30 NOTE — Telephone Encounter (Signed)
Noted. Thanks.

## 2022-12-02 ENCOUNTER — Telehealth: Payer: Self-pay | Admitting: Family Medicine

## 2022-12-02 NOTE — Telephone Encounter (Signed)
Agree and proceed.  Let me know if not improving or if worse.  Thanks.

## 2022-12-02 NOTE — Telephone Encounter (Signed)
[name] [name] from [facility] called in and stated that patient has two wounds. One of his left great toe which is contracture and on the knuckle, and doesn't think it will ever heal. She also stated that he has a wound on his right shin which is a statis ulcer 3 by 3 cm. She would like to extend her visit to continue care for the wounds for 2x a week. She would like to use silver alginate and border foam. She can be reached at [phone] for approval. Thank you!

## 2022-12-02 NOTE — Telephone Encounter (Signed)
Verbal orders given to [place] at [facility]

## 2022-12-03 DIAGNOSIS — R269 Unspecified abnormalities of gait and mobility: Secondary | ICD-10-CM | POA: Diagnosis not present

## 2022-12-04 DIAGNOSIS — L89894 Pressure ulcer of other site, stage 4: Secondary | ICD-10-CM | POA: Diagnosis not present

## 2022-12-04 DIAGNOSIS — I872 Venous insufficiency (chronic) (peripheral): Secondary | ICD-10-CM | POA: Diagnosis not present

## 2022-12-04 DIAGNOSIS — L89312 Pressure ulcer of right buttock, stage 2: Secondary | ICD-10-CM | POA: Diagnosis not present

## 2022-12-08 ENCOUNTER — Ambulatory Visit: Payer: PPO | Admitting: Family Medicine

## 2022-12-11 DIAGNOSIS — J449 Chronic obstructive pulmonary disease, unspecified: Secondary | ICD-10-CM | POA: Diagnosis not present

## 2022-12-11 DIAGNOSIS — J9611 Chronic respiratory failure with hypoxia: Secondary | ICD-10-CM | POA: Diagnosis not present

## 2022-12-13 DIAGNOSIS — R2689 Other abnormalities of gait and mobility: Secondary | ICD-10-CM | POA: Diagnosis not present

## 2022-12-19 ENCOUNTER — Telehealth: Payer: Self-pay | Admitting: Family Medicine

## 2022-12-19 MED ORDER — DOXYCYCLINE HYCLATE 100 MG PO TABS
100.0000 mg | ORAL_TABLET | Freq: Two times a day (BID) | ORAL | 0 refills | Status: DC
Start: 2022-12-19 — End: 2022-12-30

## 2022-12-19 NOTE — Telephone Encounter (Signed)
Called and spoke with Verlon Au with [facility] she states the boil on his right thigh is the size of a silver dollar, the redness around the boil is 1 inch and the area is soft. Patient has not had a fever. Verlon Au stated the patient is not able to come in person for evaluation. She asked is there anything that can be done through [facility]?

## 2022-12-19 NOTE — Telephone Encounter (Signed)
Spoke to pt's daughter, Gavin Pound, per [place]. She will go get the rx today.

## 2022-12-19 NOTE — Telephone Encounter (Signed)
I need more detail, ie measurements, ie he has a fever, etc.    If this needs I&D needs in person eval.    At this point, would rec in person eval.

## 2022-12-19 NOTE — Telephone Encounter (Signed)
Given all of that, would start doxy, rx sent.  Would use warm compresses on the area a few times a day.  If not better, would need in person eval.  Thanks.

## 2022-12-19 NOTE — Telephone Encounter (Signed)
Verlon Au from [facility] called stating the pt has a large boil on his right thigh with soft redness around it. Verlon Au is requesting a antibiotic for pt along with orders to do whatever the pt requests her to do for boil? Call back # [phone]

## 2022-12-19 NOTE — Telephone Encounter (Signed)
I spoke with Verlon Au with [facility] and she said pt is bedridden and will not go anywhere. I spoke with pt;; pt said this silver dollar sized boil or cyst is about 1/4 " swollen outward, with 1" redness around the cyst. Pt said it has been there for 1 year but recently has gotten larger and redder around the area. Pt said not really hurting but is itching and pt and his wife are not sure if draining any. No fever.advised pt may need I&D and pt said he is bedridden and cannot go anywhere; pt said he would have to call EMS to come out and take him to hospital and pt does not think that bad.pt request med (abx for it). [facility] [address] Pt request cb when reviewed by Dr Para March.UC & ED precautions given and pt voiced understanding.sending note to Dr Para March and Para March pool.

## 2022-12-19 NOTE — Addendum Note (Signed)
Addended by: Joaquim Nam on: 12/19/2022 04:36 PM   Modules accepted: Orders

## 2022-12-23 DIAGNOSIS — G4733 Obstructive sleep apnea (adult) (pediatric): Secondary | ICD-10-CM | POA: Diagnosis not present

## 2022-12-30 ENCOUNTER — Ambulatory Visit: Payer: PPO

## 2022-12-30 VITALS — Wt 220.0 lb

## 2022-12-30 DIAGNOSIS — Z0000 Encounter for general adult medical examination without abnormal findings: Secondary | ICD-10-CM | POA: Diagnosis not present

## 2022-12-30 NOTE — Progress Notes (Signed)
Subjective:   [name] is a 89 y.o. male who presents for Medicare Annual/Subsequent preventive examination.  Per patient no change in vitals since last visit, unable to obtain new vitals due to telehealth visit   Visit Complete: Virtual  I connected with  [name] [name] on 12/30/22 by a audio enabled telemedicine application and verified that I am speaking with the correct person using two identifiers.  Patient Location: Home  Provider Location: Office/Clinic  I discussed the limitations of evaluation and management by telemedicine. The patient expressed understanding and agreed to proceed.   Review of Systems     Cardiac Risk Factors include: advanced age (>81men, >4 women)     Objective:    Today's Vitals   12/30/22 1405  Weight: 220 lb (99.8 kg)   Body mass index is 31.57 kg/m.     12/30/2022    2:13 PM 06/12/2022    5:46 PM 05/31/2022    8:46 PM 05/30/2022    9:01 PM 05/26/2022   10:48 AM 02/24/2022    9:51 AM 12/23/2021    2:41 PM  Advanced Directives  Does Patient Have a Medical Advance Directive? Yes No No Yes No No No  Type of Estate agent of [place];Living will        Copy of Healthcare Power of Attorney in Chart? No - copy requested        Would patient like information on creating a medical advance directive?  No - Patient declined No - Patient declined  No - Patient declined  No - Patient declined    Current Medications (verified) Outpatient Encounter Medications as of 12/30/2022  Medication Sig   albuterol (PROAIR HFA) 108 (90 Base) MCG/ACT inhaler INHALE 1-2 PUFFS INTO THE LUNGS EVERY 6 (SIX) HOURS AS NEEDED FOR WHEEZING.   aspirin EC 81 MG tablet Take 1 tablet (81 mg total) by mouth daily.   furosemide (LASIX) 40 MG tablet TAKE 1 TABLET BY MOUTH EVERY DAY   gabapentin (NEURONTIN) 100 MG capsule Take 1-2 capsules (100-200 mg total) by mouth 3 (three) times daily as needed (for pain).   levothyroxine (SYNTHROID) 25 MCG  tablet Take 1 tablet (25 mcg total) by mouth daily.   loperamide (IMODIUM A-D) 2 MG tablet Take 1 tablet (2 mg total) by mouth 4 (four) times daily as needed for diarrhea or loose stools.   LORazepam (ATIVAN) 1 MG tablet TAKE 1/4 TO 1/2 TABLET BY MOUTH EVERY 6 HOURS AS NEEDED   OXYGEN Inhale into the lungs. 5 Liters   Respiratory Therapy Supplies (FLUTTER) DEVI Use as directed   SYMBICORT 160-4.5 MCG/ACT inhaler TAKE 2 PUFFS BY MOUTH TWICE A DAY   triamcinolone cream (KENALOG) 0.5 % Apply 1 Application topically 2 (two) times daily as needed.   Vitamin D, Ergocalciferol, (DRISDOL) 1.25 MG (50000 UNIT) CAPS capsule Take 1 capsule by mouth once weekly for 12 weeks.   [DISCONTINUED] doxycycline (VIBRA-TABS) 100 MG tablet Take 1 tablet (100 mg total) by mouth 2 (two) times daily.   Facility-Administered Encounter Medications as of 12/30/2022  Medication   epoetin alfa-epbx (RETACRIT) injection 40,000 Units    Allergies (verified) Iohexol, Atorvastatin, Celecoxib, Clinoril [sulindac], Nitrofurantoin, Spiriva handihaler [tiotropium bromide monohydrate], and Sulfa antibiotics   History: Past Medical History:  Diagnosis Date   AAA (abdominal aortic aneurysm) (HCC)    a. 3.8 cm CTA 05/2015 b. 3.9 cm by [place] in 11/2017   Anxiety    COPD (chronic obstructive pulmonary disease) (HCC)  on home O2 4LPM   Diverticulosis    Former smoker    quit 2001   Hemorrhoids    Kidney stones    passed spontaneously   OSA on CPAP    last study- in the home possibility- 2015, pt. unsure    Prediabetes    Hgb A1C 5.8 in Jan 2017   PUD (peptic ulcer disease)    Right knee DJD 10/31/2011   Severe   S/P TAVR (transcatheter aortic valve replacement) 06/26/2015   26 mm Edwards Sapien 3 transcatheter heart valve placed via percutaneous right transfemoral approach   Severe aortic stenosis    a. Echo 11/16:  Mod LVH, EF 55-60%, no RWMA, Gr 1 DD, severe AS (mean 51 mmHg; peak 86 mmHg), MAC, mild LAE  b. s/p TAVR on  06/26/2015 with 26mm Edward Sapien 3 THV   Shortness of breath dyspnea    Past Surgical History:  Procedure Laterality Date   CARDIAC CATHETERIZATION N/A 05/15/2015   Procedure: Right/Left Heart Cath and Coronary Angiography;  Surgeon: Tonny Bollman, MD;  Location: [facility] INVASIVE CV LAB;  Service: Cardiovascular;  Laterality: N/A;   CHOLECYSTECTOMY     EYE SURGERY     bilateral cataracts removed, /w IOL   KNEE SURGERY Right 1985   arthrosopic -    TEE WITHOUT CARDIOVERSION N/A 06/26/2015   Procedure: TRANSESOPHAGEAL ECHOCARDIOGRAM (TEE);  Surgeon: Tonny Bollman, MD;  Location: [facility] OR;  Service: Open Heart Surgery;  Laterality: N/A;   TONSILLECTOMY     TRANSCATHETER AORTIC VALVE REPLACEMENT, TRANSFEMORAL N/A 06/26/2015   Procedure: TRANSCATHETER AORTIC VALVE REPLACEMENT, TRANSFEMORAL;  Surgeon: Tonny Bollman, MD;  Location: 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  Tobacco Use   Smoking status: Former    Current packs/day: 0.00    Average packs/day: 1.5 packs/day for 53.0 years (79.5 ttl pk-yrs)    Types: Cigarettes    Start date: 06/22/1946    Quit date: 06/23/1999    Years since quitting: 23.5   Smokeless tobacco: Former    Types: Snuff, Chew  Vaping Use   Vaping status: Never Used  Substance and Sexual Activity   Alcohol use: No   Drug use: No   Sexual activity: Not Currently    Partners: Female  Other Topics Concern   Not on file  Social History Narrative   Retired, print  shop   Married 1958   1 daughter   Former smoker   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     Social Determinants of Health   Financial Resource Strain: Low Risk  (12/30/2022)   Overall Financial Resource Strain (CARDIA)    Difficulty of Paying Living Expenses: Not hard at all  Food Insecurity: No Food Insecurity (12/30/2022)   Hunger Vital Sign    Worried About Running Out of Food in the Last Year: Never  true    Ran Out of Food in the Last Year: Never true  Transportation Needs: Unmet Transportation Needs (12/30/2022)   PRAPARE - Transportation    Lack of Transportation (Medical): Yes    Lack of Transportation (Non-Medical): Yes  Physical Activity: Inactive (12/30/2022)   Exercise Vital Sign    Days of Exercise per Week: 0 days    Minutes of Exercise per Session: 0 min  Stress: No Stress Concern Present (12/30/2022)   [employer] of Occupational Health - Occupational Stress Questionnaire    Feeling of Stress : Not at all  Social Connections: Moderately Integrated (12/30/2022)   Social Connection and Isolation Panel [NHANES]    Frequency of Communication with Friends and Family: More than three times a week    Frequency of Social Gatherings with Friends and Family: Never    Attends Religious Services: 1 to 4 times per year    Active Member of [employer] or Organizations: No    Attends Engineer, structural: Never    Marital Status: Married    Tobacco Counseling Counseling given: Not Answered   Clinical Intake:  Pre-visit preparation completed: Yes  Pain : No/denies pain     BMI - recorded: 31.57 Nutritional Status: BMI > 30  Obese Nutritional Risks: None Diabetes: No  How often do you need to have someone help you when you read instructions, pamphlets, or other written materials from your doctor or pharmacy?: 1 - Never  Interpreter Needed?: No  Information entered by :: Lanier Ensign, LPN   Activities of Daily Living    12/30/2022    2:06 PM 06/12/2022     5:51 PM  In your present state of health, do you have any difficulty performing the following activities:  Hearing? 0   Vision? 0   Difficulty concentrating or making decisions? 0   Walking or climbing stairs? 1   Comment unable to stand   Dressing or bathing? 1   Comment assisted   Doing errands, shopping? 0 0  Preparing Food and eating ? N   Using the Toilet? N   In the past six months, have you accidently leaked urine? Y   Comment wears a brief   Do you have problems with loss of bowel control? Y   Comment wears a brief   Managing your Medications? N   Managing your Finances? N   Housekeeping or managing your Housekeeping? N     Patient Care Team: Joaquim Nam, MD as PCP - General (Family Medicine) Lanier Prude, MD as PCP - Electrophysiology (Cardiology) End, Cristal Deer, MD as PCP - Cardiology (Cardiology) Michele Mcalpine, MD as Consulting Physician (Pulmonary Disease) Nahser, Deloris Ping, MD as Consulting Physician (Cardiology) Kathyrn Sheriff, [facility] (Inactive) as Pharmacist (Pharmacist) Creig Hines, MD as Consulting Physician (Oncology)  Indicate any recent Medical Services you may have received from other than [facility] providers in the past year (date may be approximate).     Assessment:   This is a routine wellness examination for Shadi.  Hearing/Vision screen Hearing Screening - Comments:: Pt denies any hearing issues  Vision Screening - Comments:: Pt follows up with [facility] for annual eye exams   Dietary issues and exercise activities discussed:     Goals Addressed             This Visit's Progress    Patient Stated       TO BE ABLE TO WALK AGAIN  Depression Screen    12/30/2022    2:09 PM 12/16/2021    2:25 PM 06/13/2021   12:25 PM 11/02/2019    2:05 PM 03/26/2018    3:42 PM 11/14/2016   12:33 PM 12/27/2015   10:50 AM  PHQ 2/9 Scores  PHQ - 2 Score 1 0 0 0 0 2 0  PHQ- 9 Score    0 0 7     Fall Risk    12/30/2022    2:14  PM 12/16/2021    2:32 PM 06/13/2021   12:25 PM 11/02/2019    2:04 PM 05/11/2019    5:18 PM  Fall Risk   Falls in the past year? 0 0 0 1 1  Comment     Emmi Telephone Survey: data to providers prior to load  Number falls in past yr: 0 0 0 1 1  Comment     Emmi Telephone Survey Actual Response = 1  Injury with Fall? 0 0 0 0 1  Risk for fall due to : Impaired mobility;Impaired vision No Fall Risks History of fall(s);Impaired mobility Medication side effect;Impaired balance/gait;History of fall(s)   Follow up Falls prevention discussed  Falls evaluation completed Falls evaluation completed;Falls prevention discussed     MEDICARE RISK AT HOME:  Medicare Risk at Home - 12/30/22 1414     Any stairs in or around the home? No    If so, are there any without handrails? No    Home free of loose throw rugs in walkways, pet beds, electrical cords, etc? Yes    Adequate lighting in your home to reduce risk of falls? Yes    Life alert? Yes    Use of a cane, walker or w/c? Yes    Grab bars in the bathroom? No    Shower chair or bench in shower? No    Elevated toilet seat or a handicapped toilet? No             TIMED UP AND GO:  Was the test performed?  No    Cognitive Function:    11/02/2019    2:07 PM 03/26/2018    3:42 PM 11/14/2016   11:12 AM 09/14/2015   11:14 AM  MMSE - Mini Mental State Exam  Orientation to time 5 5 5 5   Orientation to Place 5 5 5 5   Registration 3 3 3 3   Attention/ Calculation 5 0 0 0  Recall 3 3 2 3   Recall-comments   pt was unable to recall 1 of 3 words   Language- name 2 objects  0 0 0  Language- repeat 1 1 1 1   Language- follow 3 step command  3 3 3   Language- read & follow direction  0 0 0  Write a sentence  0 0 0  Copy design  0 0 0  Total score  20 19 20         12/30/2022    2:15 PM 12/16/2021    2:33 PM  6CIT Screen  What Year? 0 points 0 points  What month? 0 points 0 points  What time? 0 points 0 points  Count back from 20 0 points 0 points   Months in reverse 0 points 0 points  Repeat phrase 0 points 2 points  Total Score 0 points 2 points    Immunizations Immunization History  Administered Date(s) Administered   Influenza Split 06/17/2011, 02/23/2012   Influenza Whole 04/27/2006, 04/10/2009, 04/10/2010   Influenza, Seasonal, Injecte, Preservative Fre  06/13/2016   Influenza,inj,Quad PF,6+ Mos 03/17/2013, 04/24/2014, 03/17/2015, 07/15/2016, 03/26/2018   PFIZER(Purple Top)SARS-COV-2 Vaccination 07/18/2019, 08/08/2019, 05/16/2020   Pneumococcal Polysaccharide-23 02/15/1999, 10/20/2011   Td 02/05/2006, 01/08/2017   Zoster, Live 01/15/2009    TDAP status: Up to date  Flu Vaccine status: Up to date  Pneumococcal vaccine status: Due, Education has been provided regarding the importance of this vaccine. Advised may receive this vaccine at local pharmacy or Health Dept. Aware to provide a copy of the vaccination record if obtained from local pharmacy or Health Dept. Verbalized acceptance and understanding.  Covid-19 vaccine status: Completed vaccines  Qualifies for Shingles Vaccine? Yes   Zostavax completed No   Shingrix Completed?: No.    Education has been provided regarding the importance of this vaccine. Patient has been advised to call insurance company to determine out of pocket expense if they have not yet received this vaccine. Advised may also receive vaccine at local pharmacy or Health Dept. Verbalized acceptance and understanding.  Screening Tests Health Maintenance  Topic Date Due   Zoster Vaccines- Shingrix (1 of 2) 09/05/1952   Pneumonia Vaccine 25+ Years old (2 of 2 - PCV) 10/19/2012   Colonoscopy  11/14/2025 (Originally 03/22/2012)   INFLUENZA VACCINE  01/15/2023   Medicare Annual Wellness (AWV)  12/30/2023   DTaP/Tdap/Td (3 - Tdap) 01/09/2027   HPV VACCINES  Aged Out   COVID-19 Vaccine  Discontinued    Health Maintenance  Health Maintenance Due  Topic Date Due   Zoster Vaccines- Shingrix (1 of 2)  09/05/1952   Pneumonia Vaccine 22+ Years old (2 of 2 - PCV) 10/19/2012    Colorectal cancer screening: No longer required. Per pt    Additional Screening:   Vision Screening: Recommended annual ophthalmology exams for early detection of glaucoma and other disorders of the eye. Is the patient up to date with their annual eye exam?  Yes  Who is the provider or what is the name of the office in which the patient attends annual eye exams? [facility]  If pt is not established with a provider, would they like to be referred to a provider to establish care? No .   Dental Screening: Recommended annual dental exams for proper oral hygiene    Community Resource Referral / Chronic Care Management: CRR required this visit?  No   CCM required this visit?  No     Plan:     I have personally reviewed and noted the following in the patient's chart:   Medical and social history Use of alcohol, tobacco or illicit drugs  Current medications and supplements including opioid prescriptions. Patient is not currently taking opioid prescriptions. Functional ability and status Nutritional status Physical activity Advanced directives List of other physicians Hospitalizations, surgeries, and ER visits in previous 12 months Vitals Screenings to include cognitive, depression, and falls Referrals and appointments  In addition, I have reviewed and discussed with patient certain preventive protocols, quality metrics, and best practice recommendations. A written personalized care plan for preventive services as well as general preventive health recommendations were provided to patient.     Marzella Schlein, LPN   [id number]/61/0960   After Visit Summary: (MyChart) Due to this being a telephonic visit, the after visit summary with patients personalized plan was offered to patient via MyChart   Nurse Notes: none

## 2022-12-30 NOTE — Patient Instructions (Signed)
[name] , Thank you for taking time to come for your Medicare Wellness Visit. I appreciate your ongoing commitment to your health goals. Please review the following plan we discussed and let me know if I can assist you in the future.   These are the goals we discussed:  Goals       Increase physical activity      Starting 03/26/2018, I will continue to do arm and leg exercises for 30 min 4-5 days per week.       Patient Stated      11/02/2019, I will maintain and continue medications as prescribed.       Patient Stated      TO BE ABLE TO WALK AGAIN       Stay healthy (pt-stated)      Track and Manage My Blood Pressure-Hypertension      Timeframe:  Long-Range Goal Priority:  Medium Start Date:       07/08/21                      Expected End Date:   07/08/22                    Follow Up Date July 2023   - check blood pressure weekly - choose a place to take my blood pressure (home, clinic or office, retail store) - write blood pressure results in a log or diary    Why is this important?   You won't feel high blood pressure, but it can still hurt your blood vessels.  High blood pressure can cause heart or kidney problems. It can also cause a stroke.  Making lifestyle changes like losing a little weight or eating less salt will help.  Checking your blood pressure at home and at different times of the day can help to control blood pressure.  If the doctor prescribes medicine remember to take it the way the doctor ordered.  Call the office if you cannot afford the medicine or if there are questions about it.     Notes:         This is a list of the screening recommended for you and due dates:  Health Maintenance  Topic Date Due   Zoster (Shingles) Vaccine (1 of 2) 09/05/1952   Pneumonia Vaccine (2 of 2 - PCV) 10/19/2012   Colon Cancer Screening  11/14/2025*   Flu Shot  01/15/2023   Medicare Annual Wellness Visit  12/30/2023   DTaP/Tdap/Td vaccine (3 - Tdap) 01/09/2027    HPV Vaccine  Aged Out   COVID-19 Vaccine  Discontinued  *Topic was postponed. The date shown is not the original due date.    Advanced directives: Please bring a copy of your health care power of attorney and living will to the office at your convenience.  Conditions/risks identified: get back to walking again   Next appointment: Follow up in one year for your annual wellness visit.   Preventive Care 87 Years and Older, Male  Preventive care refers to lifestyle choices and visits with your health care provider that can promote health and wellness. What does preventive care include? A yearly physical exam. This is also called an annual well check. Dental exams once or twice a year. Routine eye exams. Ask your health care provider how often you should have your eyes checked. Personal lifestyle choices, including: Daily care of your teeth and gums. Regular physical activity. Eating a healthy diet. Avoiding tobacco  and drug use. Limiting alcohol use. Practicing safe sex. Taking low doses of aspirin every day. Taking vitamin and mineral supplements as recommended by your health care provider. What happens during an annual well check? The services and screenings done by your health care provider during your annual well check will depend on your age, overall health, lifestyle risk factors, and family history of disease. Counseling  Your health care provider may ask you questions about your: Alcohol use. Tobacco use. Drug use. Emotional well-being. Home and relationship well-being. Sexual activity. Eating habits. History of falls. Memory and ability to understand (cognition). Work and work Astronomer. Screening  You may have the following tests or measurements: Height, weight, and BMI. Blood pressure. Lipid and cholesterol levels. These may be checked every 5 years, or more frequently if you are over 71 years old. Skin check. Lung cancer screening. You may have this screening  every year starting at age 11 if you have a 30-pack-year history of smoking and currently smoke or have quit within the past 15 years. Fecal occult blood test (FOBT) of the stool. You may have this test every year starting at age 54. Flexible sigmoidoscopy or colonoscopy. You may have a sigmoidoscopy every 5 years or a colonoscopy every 10 years starting at age 36. Prostate cancer screening. Recommendations will vary depending on your family history and other risks. Hepatitis C blood test. Hepatitis B blood test. Sexually transmitted disease (STD) testing. Diabetes screening. This is done by checking your blood sugar (glucose) after you have not eaten for a while (fasting). You may have this done every 1-3 years. Abdominal aortic aneurysm (AAA) screening. You may need this if you are a current or former smoker. Osteoporosis. You may be screened starting at age 49 if you are at high risk. Talk with your health care provider about your test results, treatment options, and if necessary, the need for more tests. Vaccines  Your health care provider may recommend certain vaccines, such as: Influenza vaccine. This is recommended every year. Tetanus, diphtheria, and acellular pertussis (Tdap, Td) vaccine. You may need a Td booster every 10 years. Zoster vaccine. You may need this after age 3. Pneumococcal 13-valent conjugate (PCV13) vaccine. One dose is recommended after age 87. Pneumococcal polysaccharide (PPSV23) vaccine. One dose is recommended after age 48. Talk to your health care provider about which screenings and vaccines you need and how often you need them. This information is not intended to replace advice given to you by your health care provider. Make sure you discuss any questions you have with your health care provider. Document Released: 06/29/2015 Document Revised: 02/20/2016 Document Reviewed: 04/03/2015 Elsevier Interactive Patient Education  2017 [employer].  Fall Prevention in  the Home Falls can cause injuries. They can happen to people of all ages. There are many things you can do to make your home safe and to help prevent falls. What can I do on the outside of my home? Regularly fix the edges of walkways and driveways and fix any cracks. Remove anything that might make you trip as you walk through a door, such as a raised step or threshold. Trim any bushes or trees on the path to your home. Use bright outdoor lighting. Clear any walking paths of anything that might make someone trip, such as rocks or tools. Regularly check to see if handrails are loose or broken. Make sure that both sides of any steps have handrails. Any raised decks and porches should have guardrails on the edges.  Have any leaves, snow, or ice cleared regularly. Use sand or salt on walking paths during winter. Clean up any spills in your garage right away. This includes oil or grease spills. What can I do in the bathroom? Use night lights. Install grab bars by the toilet and in the tub and shower. Do not use towel bars as grab bars. Use non-skid mats or decals in the tub or shower. If you need to sit down in the shower, use a plastic, non-slip stool. Keep the floor dry. Clean up any water that spills on the floor as soon as it happens. Remove soap buildup in the tub or shower regularly. Attach bath mats securely with double-sided non-slip rug tape. Do not have throw rugs and other things on the floor that can make you trip. What can I do in the bedroom? Use night lights. Make sure that you have a light by your bed that is easy to reach. Do not use any sheets or blankets that are too big for your bed. They should not hang down onto the floor. Have a firm chair that has side arms. You can use this for support while you get dressed. Do not have throw rugs and other things on the floor that can make you trip. What can I do in the kitchen? Clean up any spills right away. Avoid walking on wet  floors. Keep items that you use a lot in easy-to-reach places. If you need to reach something above you, use a strong step stool that has a grab bar. Keep electrical cords out of the way. Do not use floor polish or wax that makes floors slippery. If you must use wax, use non-skid floor wax. Do not have throw rugs and other things on the floor that can make you trip. What can I do with my stairs? Do not leave any items on the stairs. Make sure that there are handrails on both sides of the stairs and use them. Fix handrails that are broken or loose. Make sure that handrails are as long as the stairways. Check any carpeting to make sure that it is firmly attached to the stairs. Fix any carpet that is loose or worn. Avoid having throw rugs at the top or bottom of the stairs. If you do have throw rugs, attach them to the floor with carpet tape. Make sure that you have a light switch at the top of the stairs and the bottom of the stairs. If you do not have them, ask someone to add them for you. What else can I do to help prevent falls? Wear shoes that: Do not have high heels. Have rubber bottoms. Are comfortable and fit you well. Are closed at the toe. Do not wear sandals. If you use a stepladder: Make sure that it is fully opened. Do not climb a closed stepladder. Make sure that both sides of the stepladder are locked into place. Ask someone to hold it for you, if possible. Clearly mark and make sure that you can see: Any grab bars or handrails. First and last steps. Where the edge of each step is. Use tools that help you move around (mobility aids) if they are needed. These include: Canes. Walkers. Scooters. Crutches. Turn on the lights when you go into a dark area. Replace any light bulbs as soon as they burn out. Set up your furniture so you have a clear path. Avoid moving your furniture around. If any of your floors are uneven, fix them.  If there are any pets around you, be aware of  where they are. Review your medicines with your doctor. Some medicines can make you feel dizzy. This can increase your chance of falling. Ask your doctor what other things that you can do to help prevent falls. This information is not intended to replace advice given to you by your health care provider. Make sure you discuss any questions you have with your health care provider. Document Released: 03/29/2009 Document Revised: 11/08/2015 Document Reviewed: 07/07/2014 Elsevier Interactive Patient Education  2017 [employer].

## 2023-01-01 ENCOUNTER — Telehealth: Payer: Self-pay | Admitting: Family Medicine

## 2023-01-01 NOTE — Telephone Encounter (Signed)
Please give the order for eval and tx.  Thanks

## 2023-01-01 NOTE — Telephone Encounter (Signed)
Irving Burton from [facility] called to report that patient has a wound on right lateral thigh that is draining,swollen,red and hard.It has yellow puss coming out,and painful. She would like to know how Dr Para March would like the wound to be treated?

## 2023-01-01 NOTE — Telephone Encounter (Signed)
Orders given to [place] at [facility]. Irving Burton stated the the nurse is going out to see patient tomorrow.

## 2023-01-02 ENCOUNTER — Encounter: Payer: Self-pay | Admitting: Family Medicine

## 2023-01-02 DIAGNOSIS — R269 Unspecified abnormalities of gait and mobility: Secondary | ICD-10-CM | POA: Diagnosis not present

## 2023-01-04 ENCOUNTER — Other Ambulatory Visit: Payer: Self-pay | Admitting: Family Medicine

## 2023-01-04 DIAGNOSIS — J9611 Chronic respiratory failure with hypoxia: Secondary | ICD-10-CM

## 2023-01-08 DIAGNOSIS — I872 Venous insufficiency (chronic) (peripheral): Secondary | ICD-10-CM | POA: Diagnosis not present

## 2023-01-08 DIAGNOSIS — L89894 Pressure ulcer of other site, stage 4: Secondary | ICD-10-CM | POA: Diagnosis not present

## 2023-01-08 DIAGNOSIS — L89312 Pressure ulcer of right buttock, stage 2: Secondary | ICD-10-CM | POA: Diagnosis not present

## 2023-01-09 DIAGNOSIS — L89894 Pressure ulcer of other site, stage 4: Secondary | ICD-10-CM | POA: Diagnosis not present

## 2023-01-09 DIAGNOSIS — Z954 Presence of other heart-valve replacement: Secondary | ICD-10-CM | POA: Diagnosis not present

## 2023-01-09 DIAGNOSIS — J9611 Chronic respiratory failure with hypoxia: Secondary | ICD-10-CM | POA: Diagnosis not present

## 2023-01-09 DIAGNOSIS — Z87891 Personal history of nicotine dependence: Secondary | ICD-10-CM

## 2023-01-09 DIAGNOSIS — J449 Chronic obstructive pulmonary disease, unspecified: Secondary | ICD-10-CM | POA: Diagnosis not present

## 2023-01-09 DIAGNOSIS — D500 Iron deficiency anemia secondary to blood loss (chronic): Secondary | ICD-10-CM | POA: Diagnosis not present

## 2023-01-09 DIAGNOSIS — I5033 Acute on chronic diastolic (congestive) heart failure: Secondary | ICD-10-CM | POA: Diagnosis not present

## 2023-01-09 DIAGNOSIS — I350 Nonrheumatic aortic (valve) stenosis: Secondary | ICD-10-CM | POA: Diagnosis not present

## 2023-01-09 DIAGNOSIS — Z6833 Body mass index (BMI) 33.0-33.9, adult: Secondary | ICD-10-CM

## 2023-01-09 DIAGNOSIS — E559 Vitamin D deficiency, unspecified: Secondary | ICD-10-CM | POA: Diagnosis not present

## 2023-01-09 DIAGNOSIS — I714 Abdominal aortic aneurysm, without rupture, unspecified: Secondary | ICD-10-CM | POA: Diagnosis not present

## 2023-01-09 DIAGNOSIS — Z7982 Long term (current) use of aspirin: Secondary | ICD-10-CM

## 2023-01-09 DIAGNOSIS — Z87440 Personal history of urinary (tract) infections: Secondary | ICD-10-CM

## 2023-01-09 DIAGNOSIS — G4733 Obstructive sleep apnea (adult) (pediatric): Secondary | ICD-10-CM | POA: Diagnosis not present

## 2023-01-09 DIAGNOSIS — F419 Anxiety disorder, unspecified: Secondary | ICD-10-CM | POA: Diagnosis not present

## 2023-01-09 DIAGNOSIS — I110 Hypertensive heart disease with heart failure: Secondary | ICD-10-CM | POA: Diagnosis not present

## 2023-01-09 DIAGNOSIS — Z9981 Dependence on supplemental oxygen: Secondary | ICD-10-CM

## 2023-01-10 DIAGNOSIS — J9611 Chronic respiratory failure with hypoxia: Secondary | ICD-10-CM | POA: Diagnosis not present

## 2023-01-10 DIAGNOSIS — J449 Chronic obstructive pulmonary disease, unspecified: Secondary | ICD-10-CM | POA: Diagnosis not present

## 2023-01-12 DIAGNOSIS — R2689 Other abnormalities of gait and mobility: Secondary | ICD-10-CM | POA: Diagnosis not present

## 2023-01-13 ENCOUNTER — Other Ambulatory Visit: Payer: Self-pay | Admitting: Family Medicine

## 2023-01-13 NOTE — Telephone Encounter (Signed)
Refill request for GABAPENTIN 100 MG CAPSULE   LOV - 09/04/22 Next OV - not scheduled Last refill - 08/14/22 #90/3

## 2023-01-19 ENCOUNTER — Telehealth: Payer: Self-pay | Admitting: Family Medicine

## 2023-01-19 DIAGNOSIS — I5030 Unspecified diastolic (congestive) heart failure: Secondary | ICD-10-CM | POA: Diagnosis not present

## 2023-01-19 DIAGNOSIS — G4733 Obstructive sleep apnea (adult) (pediatric): Secondary | ICD-10-CM | POA: Diagnosis not present

## 2023-01-19 DIAGNOSIS — L89892 Pressure ulcer of other site, stage 2: Secondary | ICD-10-CM | POA: Diagnosis not present

## 2023-01-19 DIAGNOSIS — J9610 Chronic respiratory failure, unspecified whether with hypoxia or hypercapnia: Secondary | ICD-10-CM | POA: Diagnosis not present

## 2023-01-19 DIAGNOSIS — E261 Secondary hyperaldosteronism: Secondary | ICD-10-CM | POA: Diagnosis not present

## 2023-01-19 DIAGNOSIS — Z7951 Long term (current) use of inhaled steroids: Secondary | ICD-10-CM | POA: Diagnosis not present

## 2023-01-19 DIAGNOSIS — G629 Polyneuropathy, unspecified: Secondary | ICD-10-CM

## 2023-01-19 DIAGNOSIS — I739 Peripheral vascular disease, unspecified: Secondary | ICD-10-CM | POA: Diagnosis not present

## 2023-01-19 DIAGNOSIS — I110 Hypertensive heart disease with heart failure: Secondary | ICD-10-CM | POA: Diagnosis not present

## 2023-01-19 DIAGNOSIS — L98499 Non-pressure chronic ulcer of skin of other sites with unspecified severity: Secondary | ICD-10-CM

## 2023-01-19 DIAGNOSIS — D692 Other nonthrombocytopenic purpura: Secondary | ICD-10-CM | POA: Diagnosis not present

## 2023-01-19 DIAGNOSIS — Z6833 Body mass index (BMI) 33.0-33.9, adult: Secondary | ICD-10-CM | POA: Diagnosis not present

## 2023-01-19 DIAGNOSIS — L02415 Cutaneous abscess of right lower limb: Secondary | ICD-10-CM | POA: Diagnosis not present

## 2023-01-19 DIAGNOSIS — J449 Chronic obstructive pulmonary disease, unspecified: Secondary | ICD-10-CM | POA: Diagnosis not present

## 2023-01-19 NOTE — Telephone Encounter (Signed)
Brandy from [facility] called to let Dr. Para March know she visited the pt today, 8/5 & the pt still has the ongoing toe wound. Gearldine Bienenstock states the pt also has a open abscess on his thigh that is draining, Gearldine Bienenstock states she gave the pt an antibiotic. Brandy requested HH orders for wound care to be submitted to [facility]? Gearldine Bienenstock states they are still working on transportation for the pt to come into office but due to him living in [place] & our office being in [place], the transportation cost is expensive. Gearldine Bienenstock states the pt considered switching pcps, to someone in [place], pt states he'll discuss that further with [employer]. Call back # [phone]

## 2023-01-19 NOTE — Telephone Encounter (Signed)
A referral has to be placed to [facility] for nursing [facility]; can not be a verbal order.

## 2023-01-19 NOTE — Telephone Encounter (Signed)
I am glad to see patient but I need to be able to see him in clinic.  If getting set up closer to home will help the patient, then I support that.  I am in favor of whatever will help the patient.    Please give the order for [facility] wound care.  Thanks.

## 2023-01-20 MED ORDER — LORATADINE 10 MG PO TABS: 10.0000 mg | ORAL_TABLET | Freq: Every day | ORAL | Status: DC

## 2023-01-20 MED ORDER — TRIPLE ANTIBIOTIC 3.5-400-5000 EX OINT: 1.0000 | TOPICAL_OINTMENT | Freq: Every day | CUTANEOUS | Status: DC

## 2023-01-20 NOTE — Telephone Encounter (Signed)
Innocent from [facility] called stating she visited the pt today, 8/6 & the pt needs the wound dressed. Innocent states the pt stated the wound is itchy & she believes the pt has been scratching the wound. Innocent requested a order be sent in at apply something on the wound? Call back # [phone]

## 2023-01-20 NOTE — Telephone Encounter (Signed)
Done. Thanks.

## 2023-01-20 NOTE — Telephone Encounter (Signed)
Please give the order for wound dressing. Thanks.   If still itching could try claritin 10mg  daily.    Without seeing the wound, I don't have other advice to give in the meantime.

## 2023-01-20 NOTE — Addendum Note (Signed)
Addended by: Joaquim Nam on: 01/20/2023 06:56 AM   Modules accepted: Orders

## 2023-01-20 NOTE — Telephone Encounter (Signed)
Called to give verbal order; stated that they need to know what to put on the wound?

## 2023-01-20 NOTE — Addendum Note (Signed)
Addended by: Joaquim Nam on: 01/20/2023 04:55 PM   Modules accepted: Orders

## 2023-01-20 NOTE — Telephone Encounter (Signed)
I presume that topical neosporin with a nonstick bandage and gently wrapped with roll gauze (changed daily) is a reasonable place to start but without having seen the patient or the wound I can't offer more detail.

## 2023-01-20 NOTE — Addendum Note (Signed)
Addended by: Joaquim Nam on: 01/20/2023 05:05 PM   Modules accepted: Orders

## 2023-01-21 NOTE — Telephone Encounter (Signed)
Spoke with Innocent and gave the verbal orders with Dr. Lianne Bushy recommendations.

## 2023-01-23 DIAGNOSIS — G4733 Obstructive sleep apnea (adult) (pediatric): Secondary | ICD-10-CM | POA: Diagnosis not present

## 2023-01-26 DIAGNOSIS — L02416 Cutaneous abscess of left lower limb: Secondary | ICD-10-CM | POA: Diagnosis not present

## 2023-01-28 ENCOUNTER — Telehealth: Payer: Self-pay | Admitting: Family Medicine

## 2023-01-28 DIAGNOSIS — R21 Rash and other nonspecific skin eruption: Secondary | ICD-10-CM

## 2023-01-28 NOTE — Telephone Encounter (Signed)
[name] [name]-please check with patient.  He has significant difficulty getting out of the house and his county based transportation cannot transport him across [place] [employer] [place].  Please see if he is willing to except to transfer to a clinic closer to home.  I have been glad to see him over the years.  While my preference under normal circumstances would be for him to continue care here, the best option in his situation may be to transfer.  Dr. Tsosie Billing you please consider excepting this kind patient in your clinic?  He is homebound unless he has significant help with transportation and the county based transportation agency does not transport across [employer].  It is very difficult for him to continue care here at [facility].  I would greatly appreciate your help.

## 2023-01-29 NOTE — Telephone Encounter (Signed)
Called and spoke with patient's daughter; she was also with patient and his wife. I inquired about plans moving forward with transferring patient to another office he can get to easier then coming here. Patient is in agreement that he needs to transfer care. [facility] was recommended to them as they make house calls. [name] made an appt already for patient to be seen on sept 5th. She wants to know Dr. Lianne Bushy input on this and/or this company if he knows anything about them. Suggestion on if she keeps the appt with them or needs to call [facility] to get patient seen?

## 2023-01-29 NOTE — Telephone Encounter (Signed)
Hi Dr Para March, unfortunately I have moved and will likely be leaving [place] some time in the next 6 months. I am happy to see him if he understands that he will have to switch providers again once I leave. If he would prefer to establish with a provider that will be in our office for a longer time period then I can forward to several of our other providers to see if they can help out.

## 2023-01-30 NOTE — Telephone Encounter (Signed)
Spoke with patients daughter and notified her of Dr. Lianne Bushy response. They are going to try this company out and if it does not work out will [facility] to est care. Is aware Dr. Birdie Sons is leaving and will need to see another provider if chooses to come there.

## 2023-01-30 NOTE — Telephone Encounter (Signed)
Jessica-please update family.  Dr. Birdie Sons is going to be moving out of the area.  If [facility] is able to make house calls and if they already got a word of mouth recommendation, then I think that is the next best option.  I thank all involved.  Dr. Richardine Service you for your consideration and I wish you the best.  I think this patient is going to try to get set up with a separate agency that can make house calls.

## 2023-02-02 DIAGNOSIS — R269 Unspecified abnormalities of gait and mobility: Secondary | ICD-10-CM | POA: Diagnosis not present

## 2023-02-05 NOTE — Telephone Encounter (Signed)
LMTCB

## 2023-02-05 NOTE — Telephone Encounter (Signed)
Pt daughter [name] called in requesting a call back to discuss extending home health services . Please advise [phone]

## 2023-02-06 NOTE — Telephone Encounter (Signed)
LMTCB

## 2023-02-09 NOTE — Telephone Encounter (Signed)
Spoke to daughter states that home heath services have ended. Wanted to know if new order was placed if he can start back with them. If so do you need to see in office?   Services are coming from [facility] for follow up on blood pressure and wound on toe.

## 2023-02-09 NOTE — Telephone Encounter (Signed)
Did he establish with the other clinic that will provide house calls?

## 2023-02-10 DIAGNOSIS — J449 Chronic obstructive pulmonary disease, unspecified: Secondary | ICD-10-CM | POA: Diagnosis not present

## 2023-02-10 DIAGNOSIS — J9611 Chronic respiratory failure with hypoxia: Secondary | ICD-10-CM | POA: Diagnosis not present

## 2023-02-10 NOTE — Addendum Note (Signed)
Addended by: Joaquim Nam on: 02/10/2023 05:13 PM   Modules accepted: Orders

## 2023-02-10 NOTE — Telephone Encounter (Signed)
I put in the order to see if they can bridge until 02/19/23.

## 2023-02-10 NOTE — Telephone Encounter (Signed)
The appt with them is not until Sept 5th

## 2023-02-12 DIAGNOSIS — R2689 Other abnormalities of gait and mobility: Secondary | ICD-10-CM | POA: Diagnosis not present

## 2023-02-13 ENCOUNTER — Telehealth: Payer: PPO | Admitting: Family Medicine

## 2023-02-13 DIAGNOSIS — R399 Unspecified symptoms and signs involving the genitourinary system: Secondary | ICD-10-CM

## 2023-02-13 NOTE — Progress Notes (Signed)
E-Visit for Urinary Problems ? ?Based on what you shared with me, I feel your condition warrants further evaluation and I recommend that you be seen for a face to face office visit.  Male bladder infections are not very common.  We worry about prostate or kidney conditions.  The standard of care is to examine the abdomen and kidneys, and to do a urine and blood test to make sure that something more serious is not going on.  We recommend that you see a provider today.  If your doctor's office is closed [facility] has the following Urgent Cares: ? ?  ?NOTE: You will not be charged for this e-visit. ? ?If you are having a true medical emergency please call 911.   ? ?  ? For an urgent face to face visit, [facility] has six urgent care centers for your convenience:  ?  ? [facility] [place] ?Get Driving Directions ?[phone] ?[address] ?[place], [place] [address] ?  ? [facility] ([place]) ?Get Driving Directions ?[phone] ?[address] ?[place], [place] [address] ? ?[facility]) ?Get Driving Directions ?[phone] ?[address] ?[place],  [place]  [address] ? ?[facility] ?Get Driving Directions ?[phone] ?[address] ?[place], [place] [address] ?  ?[facility] [place] ?Get Driving Directions  ?[phone] ?[address] ?[address] ?[place], [place] [address] ?  ? [facility]gent [facility] at [facility] ?Get Driving Directions ?[phone] ?[address] ?[place], [place] [address] ? ?Your MyChart E-visit questionnaire answers were reviewed by a board certified advanced clinical practitioner to complete your personal care plan based on your specific symptoms.  Thank you for using e-Visits. ?

## 2023-02-14 ENCOUNTER — Inpatient Hospital Stay
Admission: EM | Admit: 2023-02-14 | Discharge: 2023-02-19 | DRG: 602 | Disposition: A | Discharge status: Home Health | Payer: PPO | Attending: Internal Medicine | Admitting: Internal Medicine

## 2023-02-14 ENCOUNTER — Other Ambulatory Visit: Payer: Self-pay

## 2023-02-14 ENCOUNTER — Encounter: Payer: Self-pay | Admitting: Oncology

## 2023-02-14 DIAGNOSIS — M869 Osteomyelitis, unspecified: Secondary | ICD-10-CM | POA: Diagnosis present

## 2023-02-14 DIAGNOSIS — Z91041 Radiographic dye allergy status: Secondary | ICD-10-CM

## 2023-02-14 DIAGNOSIS — W57XXXA Bitten or stung by nonvenomous insect and other nonvenomous arthropods, initial encounter: Secondary | ICD-10-CM | POA: Diagnosis present

## 2023-02-14 DIAGNOSIS — L97529 Non-pressure chronic ulcer of other part of left foot with unspecified severity: Secondary | ICD-10-CM | POA: Diagnosis present

## 2023-02-14 DIAGNOSIS — J449 Chronic obstructive pulmonary disease, unspecified: Secondary | ICD-10-CM | POA: Diagnosis not present

## 2023-02-14 DIAGNOSIS — L97119 Non-pressure chronic ulcer of right thigh with unspecified severity: Secondary | ICD-10-CM | POA: Diagnosis present

## 2023-02-14 DIAGNOSIS — I441 Atrioventricular block, second degree: Secondary | ICD-10-CM | POA: Diagnosis present

## 2023-02-14 DIAGNOSIS — Z8711 Personal history of peptic ulcer disease: Secondary | ICD-10-CM

## 2023-02-14 DIAGNOSIS — R918 Other nonspecific abnormal finding of lung field: Secondary | ICD-10-CM | POA: Diagnosis present

## 2023-02-14 DIAGNOSIS — Z7951 Long term (current) use of inhaled steroids: Secondary | ICD-10-CM

## 2023-02-14 DIAGNOSIS — Z6831 Body mass index (BMI) 31.0-31.9, adult: Secondary | ICD-10-CM

## 2023-02-14 DIAGNOSIS — Z888 Allergy status to other drugs, medicaments and biological substances status: Secondary | ICD-10-CM

## 2023-02-14 DIAGNOSIS — J9612 Chronic respiratory failure with hypercapnia: Secondary | ICD-10-CM | POA: Diagnosis present

## 2023-02-14 DIAGNOSIS — R911 Solitary pulmonary nodule: Secondary | ICD-10-CM | POA: Diagnosis not present

## 2023-02-14 DIAGNOSIS — M62572 Muscle wasting and atrophy, not elsewhere classified, left ankle and foot: Secondary | ICD-10-CM | POA: Diagnosis present

## 2023-02-14 DIAGNOSIS — F419 Anxiety disorder, unspecified: Secondary | ICD-10-CM | POA: Diagnosis present

## 2023-02-14 DIAGNOSIS — Z79890 Hormone replacement therapy (postmenopausal): Secondary | ICD-10-CM

## 2023-02-14 DIAGNOSIS — E8729 Other acidosis: Secondary | ICD-10-CM | POA: Diagnosis present

## 2023-02-14 DIAGNOSIS — I2510 Atherosclerotic heart disease of native coronary artery without angina pectoris: Secondary | ICD-10-CM | POA: Diagnosis present

## 2023-02-14 DIAGNOSIS — R7303 Prediabetes: Secondary | ICD-10-CM | POA: Diagnosis present

## 2023-02-14 DIAGNOSIS — Z9981 Dependence on supplemental oxygen: Secondary | ICD-10-CM

## 2023-02-14 DIAGNOSIS — Z7401 Bed confinement status: Secondary | ICD-10-CM

## 2023-02-14 DIAGNOSIS — I5032 Chronic diastolic (congestive) heart failure: Secondary | ICD-10-CM | POA: Diagnosis present

## 2023-02-14 DIAGNOSIS — Z952 Presence of prosthetic heart valve: Secondary | ICD-10-CM

## 2023-02-14 DIAGNOSIS — Z87442 Personal history of urinary calculi: Secondary | ICD-10-CM

## 2023-02-14 DIAGNOSIS — G4733 Obstructive sleep apnea (adult) (pediatric): Secondary | ICD-10-CM | POA: Diagnosis not present

## 2023-02-14 DIAGNOSIS — D539 Nutritional anemia, unspecified: Secondary | ICD-10-CM | POA: Diagnosis present

## 2023-02-14 DIAGNOSIS — Z1152 Encounter for screening for COVID-19: Secondary | ICD-10-CM

## 2023-02-14 DIAGNOSIS — R5381 Other malaise: Secondary | ICD-10-CM | POA: Diagnosis present

## 2023-02-14 DIAGNOSIS — Z8249 Family history of ischemic heart disease and other diseases of the circulatory system: Secondary | ICD-10-CM

## 2023-02-14 DIAGNOSIS — Z5982 Transportation insecurity: Secondary | ICD-10-CM

## 2023-02-14 DIAGNOSIS — I959 Hypotension, unspecified: Secondary | ICD-10-CM | POA: Diagnosis not present

## 2023-02-14 DIAGNOSIS — Z953 Presence of xenogenic heart valve: Secondary | ICD-10-CM

## 2023-02-14 DIAGNOSIS — Z823 Family history of stroke: Secondary | ICD-10-CM

## 2023-02-14 DIAGNOSIS — Z66 Do not resuscitate: Secondary | ICD-10-CM | POA: Diagnosis present

## 2023-02-14 DIAGNOSIS — Z882 Allergy status to sulfonamides status: Secondary | ICD-10-CM

## 2023-02-14 DIAGNOSIS — D649 Anemia, unspecified: Secondary | ICD-10-CM | POA: Diagnosis present

## 2023-02-14 DIAGNOSIS — S71101D Unspecified open wound, right thigh, subsequent encounter: Secondary | ICD-10-CM | POA: Diagnosis not present

## 2023-02-14 DIAGNOSIS — Z7982 Long term (current) use of aspirin: Secondary | ICD-10-CM

## 2023-02-14 DIAGNOSIS — L03115 Cellulitis of right lower limb: Principal | ICD-10-CM | POA: Diagnosis present

## 2023-02-14 DIAGNOSIS — R0689 Other abnormalities of breathing: Secondary | ICD-10-CM | POA: Diagnosis not present

## 2023-02-14 DIAGNOSIS — Z79899 Other long term (current) drug therapy: Secondary | ICD-10-CM

## 2023-02-14 DIAGNOSIS — J9611 Chronic respiratory failure with hypoxia: Secondary | ICD-10-CM | POA: Diagnosis not present

## 2023-02-14 DIAGNOSIS — L0390 Cellulitis, unspecified: Secondary | ICD-10-CM

## 2023-02-14 DIAGNOSIS — I110 Hypertensive heart disease with heart failure: Secondary | ICD-10-CM | POA: Diagnosis present

## 2023-02-14 DIAGNOSIS — R531 Weakness: Secondary | ICD-10-CM

## 2023-02-14 DIAGNOSIS — I5033 Acute on chronic diastolic (congestive) heart failure: Secondary | ICD-10-CM | POA: Diagnosis present

## 2023-02-14 DIAGNOSIS — I272 Pulmonary hypertension, unspecified: Secondary | ICD-10-CM | POA: Diagnosis present

## 2023-02-14 DIAGNOSIS — Z8042 Family history of malignant neoplasm of prostate: Secondary | ICD-10-CM

## 2023-02-14 DIAGNOSIS — J439 Emphysema, unspecified: Secondary | ICD-10-CM | POA: Diagnosis present

## 2023-02-14 DIAGNOSIS — R001 Bradycardia, unspecified: Secondary | ICD-10-CM | POA: Diagnosis not present

## 2023-02-14 DIAGNOSIS — Z87891 Personal history of nicotine dependence: Secondary | ICD-10-CM

## 2023-02-14 DIAGNOSIS — D638 Anemia in other chronic diseases classified elsewhere: Secondary | ICD-10-CM | POA: Diagnosis present

## 2023-02-14 DIAGNOSIS — Z9049 Acquired absence of other specified parts of digestive tract: Secondary | ICD-10-CM

## 2023-02-14 DIAGNOSIS — J438 Other emphysema: Secondary | ICD-10-CM | POA: Diagnosis not present

## 2023-02-14 DIAGNOSIS — I714 Abdominal aortic aneurysm, without rupture, unspecified: Secondary | ICD-10-CM | POA: Diagnosis present

## 2023-02-14 DIAGNOSIS — E039 Hypothyroidism, unspecified: Secondary | ICD-10-CM | POA: Diagnosis present

## 2023-02-14 DIAGNOSIS — Z881 Allergy status to other antibiotic agents status: Secondary | ICD-10-CM

## 2023-02-14 DIAGNOSIS — E611 Iron deficiency: Secondary | ICD-10-CM | POA: Diagnosis present

## 2023-02-14 DIAGNOSIS — I509 Heart failure, unspecified: Secondary | ICD-10-CM | POA: Diagnosis not present

## 2023-02-14 DIAGNOSIS — I499 Cardiac arrhythmia, unspecified: Secondary | ICD-10-CM | POA: Diagnosis not present

## 2023-02-14 DIAGNOSIS — R5383 Other fatigue: Secondary | ICD-10-CM | POA: Diagnosis not present

## 2023-02-14 DIAGNOSIS — Z515 Encounter for palliative care: Secondary | ICD-10-CM

## 2023-02-14 LAB — COMPREHENSIVE METABOLIC PANEL
ALT: 13 U/L (ref 0–44)
AST: 20 U/L (ref 15–41)
Albumin: 2.5 g/dL — ABNORMAL LOW (ref 3.5–5.0)
Alkaline Phosphatase: 127 U/L — ABNORMAL HIGH (ref 38–126)
Anion gap: 11 (ref 5–15)
BUN: 32 mg/dL — ABNORMAL HIGH (ref 8–23)
CO2: 38 mmol/L — ABNORMAL HIGH (ref 22–32)
Calcium: 8.2 mg/dL — ABNORMAL LOW (ref 8.9–10.3)
Chloride: 91 mmol/L — ABNORMAL LOW (ref 98–111)
Creatinine, Ser: 1.14 mg/dL (ref 0.61–1.24)
GFR, Estimated: >60 mL/min (ref >60)
Glucose, Bld: 91 mg/dL (ref 70–99)
Potassium: 4.3 mmol/L (ref 3.5–5.1)
Sodium: 140 mmol/L (ref 135–145)
Total Bilirubin: 0.6 mg/dL (ref 0.3–1.2)
Total Protein: 6.6 g/dL (ref 6.5–8.1)

## 2023-02-14 LAB — URINALYSIS, ROUTINE W REFLEX MICROSCOPIC
Bilirubin Urine: NEGATIVE
Glucose, UA: NEGATIVE mg/dL
Hgb urine dipstick: NEGATIVE
Ketones, ur: NEGATIVE mg/dL
Leukocytes,Ua: NEGATIVE
Nitrite: NEGATIVE
Protein, ur: NEGATIVE mg/dL
Specific Gravity, Urine: 1.015 (ref 1.005–1.030)
pH: 5.5 (ref 5.0–8.0)

## 2023-02-14 LAB — CBC WITH DIFFERENTIAL/PLATELET
Abs Immature Granulocytes: 0.03 10*3/uL (ref 0.00–0.07)
Basophils Absolute: 0.1 10*3/uL (ref 0.0–0.1)
Basophils Relative: 1 %
Eosinophils Absolute: 0.4 10*3/uL (ref 0.0–0.5)
Eosinophils Relative: 4 %
HCT: 27.3 % — ABNORMAL LOW (ref 39.0–52.0)
Hemoglobin: 7.6 g/dL — ABNORMAL LOW (ref 13.0–17.0)
Immature Granulocytes: 0 %
Lymphocytes Relative: 16 %
Lymphs Abs: 1.5 10*3/uL (ref 0.7–4.0)
MCH: 29.7 pg (ref 26.0–34.0)
MCHC: 27.8 g/dL — ABNORMAL LOW (ref 30.0–36.0)
MCV: 106.6 fL — ABNORMAL HIGH (ref 80.0–100.0)
Monocytes Absolute: 0.6 10*3/uL (ref 0.1–1.0)
Monocytes Relative: 7 %
Neutro Abs: 7 10*3/uL (ref 1.7–7.7)
Neutrophils Relative %: 72 %
Platelets: 268 10*3/uL (ref 150–400)
RBC: 2.56 MIL/uL — ABNORMAL LOW (ref 4.22–5.81)
RDW: 14.5 % (ref 11.5–15.5)
WBC: 9.6 10*3/uL (ref 4.0–10.5)
nRBC: 0 % (ref 0.0–0.2)

## 2023-02-14 LAB — FERRITIN: Ferritin: 46 ng/mL (ref 24–336)

## 2023-02-14 LAB — TSH: TSH: 2.255 u[IU]/mL (ref 0.350–4.500)

## 2023-02-14 LAB — IRON AND TIBC
Iron: 15 ug/dL — ABNORMAL LOW (ref 45–182)
Saturation Ratios: 10 % — ABNORMAL LOW (ref 17.9–39.5)
TIBC: 155 ug/dL — ABNORMAL LOW (ref 250–450)
UIBC: 140 ug/dL

## 2023-02-14 LAB — TROPONIN I (HIGH SENSITIVITY)
Troponin I (High Sensitivity): 12 ng/L (ref <18)
Troponin I (High Sensitivity): 12 ng/L (ref <18)

## 2023-02-14 LAB — BRAIN NATRIURETIC PEPTIDE: B Natriuretic Peptide: 158 pg/mL — ABNORMAL HIGH (ref 0.0–100.0)

## 2023-02-14 LAB — RESP PANEL BY RT-PCR (RSV, FLU A&B, COVID)  RVPGX2
Influenza A by PCR: NEGATIVE
Influenza B by PCR: NEGATIVE
Resp Syncytial Virus by PCR: NEGATIVE
SARS Coronavirus 2 by RT PCR: NEGATIVE

## 2023-02-14 LAB — APTT: aPTT: 37 seconds — ABNORMAL HIGH (ref 24–36)

## 2023-02-14 LAB — PROTIME-INR
INR: 1.1 (ref 0.8–1.2)
Prothrombin Time: 14.8 seconds (ref 11.4–15.2)

## 2023-02-14 LAB — LACTIC ACID, PLASMA
Lactic Acid, Venous: 1 mmol/L (ref 0.5–1.9)
Lactic Acid, Venous: 0.7 mmol/L (ref 0.5–1.9)

## 2023-02-14 MED ORDER — ONDANSETRON HCL 4 MG/2ML IJ SOLN: 4.0000 mg | Freq: Four times a day (QID) | INTRAMUSCULAR | Status: DC | PRN

## 2023-02-14 MED ORDER — SODIUM CHLORIDE 0.9 % IV SOLN
2.0000 g | INTRAVENOUS | Status: DC
  Administered 2023-02-15 – 2023-02-16 (×2): 2 g via INTRAVENOUS
  Filled 2023-02-14 (×3): qty 20

## 2023-02-14 MED ORDER — SODIUM CHLORIDE 0.9% FLUSH
3.0000 mL | Freq: Two times a day (BID) | INTRAVENOUS | Status: DC
  Administered 2023-02-14 – 2023-02-19 (×10): 3 mL via INTRAVENOUS

## 2023-02-14 MED ORDER — LORAZEPAM 0.5 MG PO TABS: 0.5000 mg | ORAL_TABLET | Freq: Four times a day (QID) | ORAL | Status: DC | PRN

## 2023-02-14 MED ORDER — FUROSEMIDE 40 MG PO TABS
40.0000 mg | ORAL_TABLET | Freq: Every day | ORAL | Status: DC
  Administered 2023-02-15 – 2023-02-16 (×2): 40 mg via ORAL
  Filled 2023-02-14 (×2): qty 1

## 2023-02-14 MED ORDER — VANCOMYCIN HCL IN DEXTROSE 1-5 GM/200ML-% IV SOLN: 1000.0000 mg | Freq: Once | INTRAVENOUS | Status: DC

## 2023-02-14 MED ORDER — SODIUM CHLORIDE 0.9 % IV SOLN
2.0000 g | Freq: Once | INTRAVENOUS | Status: AC
  Administered 2023-02-14: 2 g via INTRAVENOUS
  Filled 2023-02-14: qty 20

## 2023-02-14 MED ORDER — LEVOTHYROXINE SODIUM 25 MCG PO TABS
25.0000 ug | ORAL_TABLET | Freq: Every day | ORAL | Status: DC
  Administered 2023-02-15 – 2023-02-19 (×5): 25 ug via ORAL
  Filled 2023-02-14 (×5): qty 1

## 2023-02-14 MED ORDER — GABAPENTIN 100 MG PO CAPS
100.0000 mg | ORAL_CAPSULE | Freq: Three times a day (TID) | ORAL | Status: DC | PRN
  Administered 2023-02-18: 200 mg via ORAL
  Filled 2023-02-14: qty 2

## 2023-02-14 MED ORDER — ALBUTEROL SULFATE (2.5 MG/3ML) 0.083% IN NEBU: 3.0000 mL | INHALATION_SOLUTION | RESPIRATORY_TRACT | Status: DC | PRN

## 2023-02-14 MED ORDER — SODIUM CHLORIDE 0.9 % IV BOLUS (SEPSIS)
1000.0000 mL | Freq: Once | INTRAVENOUS | Status: AC
  Administered 2023-02-14: 1000 mL via INTRAVENOUS

## 2023-02-14 MED ORDER — ACETAMINOPHEN 325 MG PO TABS
650.0000 mg | ORAL_TABLET | Freq: Four times a day (QID) | ORAL | Status: DC | PRN
  Administered 2023-02-18 (×2): 650 mg via ORAL
  Filled 2023-02-14 (×2): qty 2

## 2023-02-14 MED ORDER — MOMETASONE FURO-FORMOTEROL FUM 200-5 MCG/ACT IN AERO
2.0000 | INHALATION_SPRAY | Freq: Two times a day (BID) | RESPIRATORY_TRACT | Status: DC
  Administered 2023-02-15 – 2023-02-19 (×8): 2 via RESPIRATORY_TRACT
  Filled 2023-02-14 (×2): qty 8.8

## 2023-02-14 MED ORDER — POLYETHYLENE GLYCOL 3350 17 G PO PACK: 17.0000 g | PACK | Freq: Every day | ORAL | Status: DC | PRN

## 2023-02-14 MED ORDER — ENOXAPARIN SODIUM 60 MG/0.6ML IJ SOSY
50.0000 mg | PREFILLED_SYRINGE | INTRAMUSCULAR | Status: DC
  Administered 2023-02-14 – 2023-02-18 (×5): 50 mg via SUBCUTANEOUS
  Filled 2023-02-14 (×5): qty 0.6

## 2023-02-14 MED ORDER — VANCOMYCIN HCL 2000 MG/400ML IV SOLN
2000.0000 mg | Freq: Once | INTRAVENOUS | Status: AC
  Administered 2023-02-14: 2000 mg via INTRAVENOUS
  Filled 2023-02-14: qty 400

## 2023-02-14 MED ORDER — VANCOMYCIN HCL 1500 MG/300ML IV SOLN
1500.0000 mg | INTRAVENOUS | Status: DC
  Administered 2023-02-15 – 2023-02-16 (×2): 1500 mg via INTRAVENOUS
  Filled 2023-02-14 (×4): qty 300

## 2023-02-14 MED ORDER — ONDANSETRON HCL 4 MG PO TABS: 4.0000 mg | ORAL_TABLET | Freq: Four times a day (QID) | ORAL | Status: DC | PRN

## 2023-02-14 MED ORDER — ACETAMINOPHEN 650 MG RE SUPP: 650.0000 mg | Freq: Four times a day (QID) | RECTAL | Status: DC | PRN

## 2023-02-14 MED ORDER — ASPIRIN 81 MG PO TBEC
81.0000 mg | DELAYED_RELEASE_TABLET | Freq: Every day | ORAL | Status: DC
  Administered 2023-02-15 – 2023-02-19 (×5): 81 mg via ORAL
  Filled 2023-02-14 (×5): qty 1

## 2023-02-14 NOTE — Progress Notes (Signed)
PHARMACIST - PHYSICIAN COMMUNICATION  CONCERNING:  Enoxaparin (Lovenox) for DVT Prophylaxis    RECOMMENDATION: Patient was prescribed enoxaprin 40mg  q24 hours for VTE prophylaxis.   Filed Weights   02/14/23 1306  Weight: 99.8 kg (220 lb 0.3 oz)    Body mass index is 31.57 kg/m.  Estimated Creatinine Clearance: 54 mL/min (by C-G formula based on SCr of 1.14 mg/dL).   Based on [facility] policy patient is candidate for enoxaparin 0.5mg /kg TBW SQ every 24 hours based on BMI being >30.  DESCRIPTION: Pharmacy has adjusted enoxaparin dose per [facility] policy.  Patient is now receiving enoxaparin 50 mg every 24 hours    Barrie Folk, PharmD Clinical Pharmacist  02/14/2023 5:07 PM

## 2023-02-14 NOTE — Consult Note (Signed)
Pharmacy Antibiotic Note  [name] [name] is a 89 y.o. male admitted on 02/14/2023 with cellulitis.  Pharmacy has been consulted for vancomycin dosing.  Vancomycin 2000 mg IV x 1 given 8/31 @ 1539  Plan: Start vancomycin 1500 mg IV every 24 hours AUC 479.6, Cmin 12.1 IBW, Scr 1.14, Vd 0.72 Vancomycin levels at steady state or as clinically indicated Follow renal function for adjustments  Height: 5\' 10"  (177.8 cm) Weight: 99.8 kg (220 lb 0.3 oz) IBW/kg (Calculated) : 73  Temp (24hrs), Avg:98.2 F (36.8 C), Min:98 F (36.7 C), Max:98.3 F (36.8 C)  Recent Labs  Lab 02/14/23 1345 02/14/23 1533  WBC 9.6  --   CREATININE 1.14  --   LATICACIDVEN 1.0 0.7    Estimated Creatinine Clearance: 54 mL/min (by C-G formula based on SCr of 1.14 mg/dL).    Allergies  Allergen Reactions   Iohexol Hives and Swelling    Pt reports swelling, redness, hives, and blisters    Atorvastatin     REACTION: aches   Celecoxib     REACTION: rash   Clinoril [Sulindac]     REACTION: rash   Nitrofurantoin    Spiriva Handihaler [Tiotropium Bromide Monohydrate] Other (See Comments)    Dry mouth   Sulfa Antibiotics     Intolerant but unrecalled.     Antimicrobials this admission: vancomycin 8/31 >>  Ceftriaxone 8/31 >>  Dose adjustments this admission: N/A  Microbiology results: 8/31 BCx: pending  Thank you for allowing pharmacy to be a part of this patient's care.  Barrie Folk, PharmD 02/14/2023 6:24 PM

## 2023-02-14 NOTE — Assessment & Plan Note (Signed)
Likely secondary to bradycardia in the setting of AV block  - PT/OT

## 2023-02-14 NOTE — Assessment & Plan Note (Signed)
Per chart review, patient has a history of chronic anemia with baseline hemoglobin between 9-10.  Currently 7.6 with no reported history of bleeding.  Iron panel was obtained and although he is iron deficient, overall more consistent with anemia of chronic disease (most likely due to underlying malignancy.)  - CBC daily - Transfuse for hemoglobin less than 7 - Start iron supplementation

## 2023-02-14 NOTE — Assessment & Plan Note (Signed)
-   Continue home 5 L supplemental oxygen

## 2023-02-14 NOTE — Assessment & Plan Note (Signed)
No evidence of exacerbation at this time.  Patient has severe COPD though and is currently on 5 L at baseline  - Continue home bronchodilators

## 2023-02-14 NOTE — ED Notes (Signed)
RT was called to inquire about pt's cpap that was ordered. RT stated they will come as soon as possible to set up for pt

## 2023-02-14 NOTE — H&P (Signed)
History and Physical    Patient: [name] [name]:[id number] DOB: [birth year] DOA: 02/14/2023 DOS: the patient was seen and examined on 02/14/2023 PCP: Joaquim Nam, MD  Patient coming from: Home  Chief Complaint:  Chief Complaint  Patient presents with   Weakness   Wound Infection   HPI: [name] [name] is a 89 y.o. male with medical history significant of chronic hypoxic respiratory failure on 5 L secondary to COPD, AV heart block, OSA on CPAP, severe aortic stenosis s/p TAVR, PUD, prediabetes, HFpEF, who presents to the ED due to generalized weakness and a wound.  History provided both by patient and his family at bedside.  [name] [name] states that at this time, he has been feeling weak but denies any increased shortness of breath, cough, chest pain or palpitations. His family endorses that he has been feeling weak for several days now.  In addition, he had a small wound to his right leg that they thought may have been a bug bite.  It seemed that patient was scratching at it often and the bug bite turned larger and began to drain.  Initially, it seemed to be improving but over the last couple days, the wound has become very red and hot to the touch.  No fevers noted at home.  In addition, patient has a wound to his left great toe that they have been wrapping and seems to be healing well though  ED course: On arrival to the ED, patient was hypertensive at 154/64 with heart rate of 47.  He was saturating at 99% on his home 5 L.  He was afebrile at 98.  Initial workup demonstrated a hemoglobin of 7.6, MCV of 106, bicarb of 38, BUN 32, creatinine 1.14, alkaline phosphatase 127, albumin 2.5, and GFR above 60.  Troponin negative at 12.  BNP mildly elevated at 158.  Urinalysis and viral panel negative.  Cardiology was consulted regarding 2-1 AV block.  TRH contacted for admission.   Review of Systems: As mentioned in the history of present illness. All other systems reviewed and are negative.  Past  Medical History:  Diagnosis Date   AAA (abdominal aortic aneurysm) (HCC)    a. 3.8 cm CTA 05/2015 b. 3.9 cm by [place] in 11/2017   Anxiety    COPD (chronic obstructive pulmonary disease) (HCC)    on home O2 4LPM   Diverticulosis    Former smoker    quit 2001   Hemorrhoids    Kidney stones    passed spontaneously   OSA on CPAP    last study- in the home possibility- 2015, pt. unsure    Prediabetes    Hgb A1C 5.8 in Jan 2017   PUD (peptic ulcer disease)    Right knee DJD 10/31/2011   Severe   S/P TAVR (transcatheter aortic valve replacement) 06/26/2015   26 mm Edwards Sapien 3 transcatheter heart valve placed via percutaneous right transfemoral approach   Severe aortic stenosis    a. Echo 11/16:  Mod LVH, EF 55-60%, no RWMA, Gr 1 DD, severe AS (mean 51 mmHg; peak 86 mmHg), MAC, mild LAE  b. s/p TAVR on 06/26/2015 with 26mm Edward Sapien 3 THV   Shortness of breath dyspnea    Past Surgical History:  Procedure Laterality Date   CARDIAC CATHETERIZATION N/A 05/15/2015   Procedure: Right/Left Heart Cath and Coronary Angiography;  Surgeon: Tonny Bollman, MD;  Location: [facility] INVASIVE CV LAB;  Service: Cardiovascular;  Laterality: N/A;   CHOLECYSTECTOMY  EYE SURGERY     bilateral cataracts removed, /w IOL   KNEE SURGERY Right 1985   arthrosopic -    TEE WITHOUT CARDIOVERSION N/A 06/26/2015   Procedure: TRANSESOPHAGEAL ECHOCARDIOGRAM (TEE);  Surgeon: Tonny Bollman, MD;  Location: [facility] OR;  Service: Open Heart Surgery;  Laterality: N/A;   TONSILLECTOMY     TRANSCATHETER AORTIC VALVE REPLACEMENT, TRANSFEMORAL N/A 06/26/2015   Procedure: TRANSCATHETER AORTIC VALVE REPLACEMENT, TRANSFEMORAL;  Surgeon: Tonny Bollman, MD;  Location[facility] OR;  Service: Open Heart Surgery;  Laterality: N/A;   Social History:  reports that he quit smoking about 23 years ago. His smoking use included cigarettes. He started smoking about 76 years ago. He has a 79.5 pack-year smoking history. He has quit using smokeless  tobacco.  His smokeless tobacco use included snuff and chew. He reports that he does not drink alcohol and does not use drugs.  Allergies  Allergen Reactions   Iohexol Hives and Swelling    Pt reports swelling, redness, hives, and blisters    Atorvastatin     REACTION: aches   Celecoxib     REACTION: rash   Clinoril [Sulindac]     REACTION: rash   Nitrofurantoin    Spiriva Handihaler [Tiotropium Bromide Monohydrate] Other (See Comments)    Dry mouth   Sulfa Antibiotics     Intolerant but unrecalled.     Family History  Problem Relation Age of Onset   Stroke Mother    Heart attack Mother 56       deceased   Prostate cancer Father 58   Prostate cancer Brother    Colon cancer Neg Hx     Prior to Admission medications   Medication Sig Start Date End Date Taking? Authorizing Provider  albuterol (PROAIR HFA) 108 (90 Base) MCG/ACT inhaler INHALE 1-2 PUFFS INTO THE LUNGS EVERY 6 (SIX) HOURS AS NEEDED FOR WHEEZING. 07/08/21   Joaquim Nam, MD  aspirin EC 81 MG tablet Take 1 tablet (81 mg total) by mouth daily. 04/24/15   Tereso Newcomer T, PA-C  furosemide (LASIX) 40 MG tablet TAKE 1 TABLET BY MOUTH EVERY DAY 10/03/22   Joaquim Nam, MD  gabapentin (NEURONTIN) 100 MG capsule TAKE 1-2 CAPSULES (100-200 MG TOTAL) BY MOUTH 3 (THREE) TIMES DAILY AS NEEDED (FOR PAIN). 01/14/23   Joaquim Nam, MD  levothyroxine (SYNTHROID) 25 MCG tablet Take 1 tablet (25 mcg total) by mouth daily. 09/05/22   Joaquim Nam, MD  loperamide (IMODIUM A-D) 2 MG tablet Take 1 tablet (2 mg total) by mouth 4 (four) times daily as needed for diarrhea or loose stools. 11/28/22   Joaquim Nam, MD  loratadine (CLARITIN) 10 MG tablet Take 1 tablet (10 mg total) by mouth daily. 01/20/23   Joaquim Nam, MD  LORazepam (ATIVAN) 1 MG tablet TAKE 1/4 TO 1/2 TABLET BY MOUTH EVERY 6 HOURS AS NEEDED 11/14/22   Joaquim Nam, MD  neomycin-bacitracin-polymyxin 3.5-7061001578 OINT Apply 1 Application topically daily.  01/20/23   Joaquim Nam, MD  OXYGEN Inhale into the lungs. 5 Liters    [provider]  Respiratory Therapy Supplies (FLUTTER) DEVI Use as directed 02/23/12   Clance, Maree Krabbe, MD  SYMBICORT 160-4.5 MCG/ACT inhaler TAKE 2 PUFFS BY MOUTH TWICE A DAY 06/30/22   Joaquim Nam, MD  triamcinolone cream (KENALOG) 0.5 % Apply 1 Application topically 2 (two) times daily as needed. 09/04/22   Joaquim Nam, MD  Vitamin D, Ergocalciferol, (DRISDOL) 1.25 MG (  50000 UNIT) CAPS capsule Take 1 capsule by mouth once weekly for 12 weeks. 09/05/22   Joaquim Nam, MD    Physical Exam: Vitals:   02/14/23 1515 02/14/23 1530 02/14/23 1600 02/14/23 1630  BP: (!) 155/65 (!) 148/49 (!) 142/52 (!) 154/54  Pulse: (!) 58 (!) 45 (!) 45 (!) 45  Resp: 14 (!) 21 (!) 23 19  Temp:   98.3 F (36.8 C)   TempSrc:   Oral   SpO2: 93% 100% 100% 100%  Weight:      Height:       Physical Exam Vitals and nursing note reviewed.  Constitutional:      Appearance: He is obese.     Comments: Chronically ill-appearing  HENT:     Head: Normocephalic and atraumatic.     Mouth/Throat:     Mouth: Mucous membranes are moist.     Pharynx: Oropharynx is clear.  Eyes:     Conjunctiva/sclera: Conjunctivae normal.     Pupils: Pupils are equal, round, and reactive to light.  Cardiovascular:     Rate and Rhythm: Regular rhythm. Bradycardia present.     Heart sounds: No murmur heard. Pulmonary:     Effort: No respiratory distress.     Breath sounds: Wheezing (Diffusely) present. No rhonchi or rales.  Musculoskeletal:     Right lower leg: No edema.     Left lower leg: No edema.  Skin:    General: Skin is warm and dry.     Comments: On the lateral aspect of the right mid thigh, there is an approximately 1 cm ulceration with some purulent drainage but no fluctuance or induration to suggest abscess.  Surrounding this area, there is approximately 4 cm of erythema that is well-demarcated.  Please picture below.  In  addition, patient's left great toe with a ulcer that appears noninfected and healing well.  Neurological:     Mental Status: He is alert. Mental status is at baseline.     Comments: Nonfocal weakness throughout  Psychiatric:        Mood and Affect: Mood normal.        Behavior: Behavior normal.      Data Reviewed: CBC with WBC 9.6, hemoglobin 7.6, MCV 106.6, platelets of 268 CMP with sodium of 140, potassium 4.3, bicarb 38, BUN 32, creatinine 1.14, alkaline phosphatase 127, albumin 2.5, AST 20, ALT 13 and GFR above 60 BNP elevated at 150 Troponin negative at 12 Lactic acid 1 Urinalysis negative with the exception of bacteria but no evidence of leukocytes, nitrites, WBCs or RBCs  Influenza, RSV and COVID-19 PCR negative  EKG personally reviewed.  Second-degree AV block, Mobitz type II with 2-1 block.  No ST or T wave changes concerning for acute ischemia  Results are pending, will review when available.  Assessment and Plan:  * Second degree Mobitz II AV block Patient is presenting with several day 3 of generalized weakness found to have a second-degree AV block, Mobitz type II with rates persisting in the mid 40s.  It appears that he has a history of this and has been evaluated by EP, however he does not recall this at all.  Patient is not on any AV nodal blocking agents.  History of hypothyroidism, so we will check TSH.  Low likelihood cellulitis is contributing to current picture.  - Cardiology consulted; appreciate their recommendations - TSH pending - Pacer pads at bedside - Unlikely atropine to be useful in this scenario, however can trial if  patient's heart rate worsens < 30  Cellulitis Patient has an area of ulceration on the left thigh with purulent drainage and surrounding cellulitis, the family believes started as a bug bite that patient picked up.  - Continue ceftriaxone and vancomycin - Wound care consult placed  Generalized weakness Likely secondary to  bradycardia in the setting of AV block  - PT/OT  Normocytic anemia Per chart review, patient has a history of chronic anemia with baseline hemoglobin between 9-10.  Currently 7.6 with no reported history of bleeding.  Iron panel was obtained and although he is iron deficient, overall more consistent with anemia of chronic disease (most likely due to underlying malignancy.)  - CBC daily - Transfuse for hemoglobin less than 7 - Start iron supplementation  Lung nodule Patient has a history of a chronic right upper lobe nodule that has been followed and previously measured 4 mm in 2016.  On CT scan obtained 8 months prior in the setting of sepsis, nodule enlarged up to 2.5 x 3.9 cm.  Given patient's history, this is most consistent with primary lung cancer, possibly adenocarcinoma given chronicity.  At this time, [name] [name] is not interested in pursuing any sort of treatment so he is unsure if he would like to repeat imaging to see if there is been any interval change.  He is going to discuss this further with his family and let us know.  - Possible CT chest pending patient's family discussion  COPD (chronic obstructive pulmonary disease) with emphysema (HCC) No evidence of exacerbation at this time.  Patient has severe COPD though and is currently on 5 L at baseline  - Continue home bronchodilators  Chronic respiratory failure with hypoxia (HCC) - Continue home 5 L supplemental oxygen  Chronic heart failure with preserved ejection fraction (HFpEF) (HCC) No evidence of frank hypervolemia at this time.  - Resume home oral Lasix tomorrow  S/P TAVR (transcatheter aortic valve replacement) Patient had a history of severe aortic stenosis s/p TAVR without complications.  Previously followed with Dr. Excell Seltzer.  Advance Care Planning:   Code Status: Limited: Do not attempt resuscitation (DNR) -DNR-LIMITED -Do Not Intubate/DNI patient states that he would never want to be placed on a ventilator.  We  discussed that patients who receive CPR require ventilator support after resuscitation due to multiple injuries caused by CPR.  He states that in this case, he would not want to receive CPR if it places him at risk for needing mechanical ventilation.  Patient's family at bedside during conversation.  Consults: Cardiology  Family Communication: Patient's wife, daughter, and son-in-law updated at bedside  Severity of Illness: The appropriate patient status for this patient is OBSERVATION. Observation status is judged to be reasonable and necessary in order to provide the required intensity of service to ensure the patient's safety. The patient's presenting symptoms, physical exam findings, and initial radiographic and laboratory data in the context of their medical condition is felt to place them at decreased risk for further clinical deterioration. Furthermore, it is anticipated that the patient will be medically stable for discharge from the hospital within 2 midnights of admission.   Author: Verdene Lennert, MD 02/14/2023 6:24 PM  For on call review [email or web address].

## 2023-02-14 NOTE — Assessment & Plan Note (Signed)
Patient had a history of severe aortic stenosis s/p TAVR without complications.  Previously followed with Dr. Excell Seltzer.

## 2023-02-14 NOTE — Assessment & Plan Note (Signed)
Patient has a history of a chronic right upper lobe nodule that has been followed and previously measured 4 mm in 2016.  On CT scan obtained 8 months prior in the setting of sepsis, nodule enlarged up to 2.5 x 3.9 cm.  Given patient's history, this is most consistent with primary lung cancer, possibly adenocarcinoma given chronicity.  At this time, [name] [name] is not interested in pursuing any sort of treatment so he is unsure if he would like to repeat imaging to see if there is been any interval change.  He is going to discuss this further with his family and let us know.  - Possible CT chest pending patient's family discussion

## 2023-02-14 NOTE — Sepsis Progress Note (Signed)
Elink following code sepsis °

## 2023-02-14 NOTE — Consult Note (Signed)
CODE SEPSIS - PHARMACY COMMUNICATION  **Broad Spectrum Antibiotics should be administered within 1 hour of Sepsis diagnosis**  Time Code Sepsis Called/Page Received: 1313  Antibiotics Ordered: ceftriaxone and vancomycin  Time of 1st antibiotic administration: 1400  Additional action taken by pharmacy: N/A  Barrie Folk ,PharmD Clinical Pharmacist  02/14/2023  1:15 PM

## 2023-02-14 NOTE — ED Triage Notes (Addendum)
Pt comes in by ems from home gor general weakness and wound check. Pt's home nurse reported to ems pt has has been weak for the past couple of days and noticed the wound on the right thigh is red & hot to touch. Pt is alert & oriented to place. Pt gets confused but easily reoriented. Pt is 5L [place] at home.

## 2023-02-14 NOTE — Assessment & Plan Note (Signed)
Patient has an area of ulceration on the left thigh with purulent drainage and surrounding cellulitis, the family believes started as a bug bite that patient picked up.  - Continue ceftriaxone and vancomycin - Wound care consult placed

## 2023-02-14 NOTE — Assessment & Plan Note (Signed)
Patient is presenting with several day 3 of generalized weakness found to have a second-degree AV block, Mobitz type II with rates persisting in the mid 40s.  It appears that he has a history of this and has been evaluated by EP, however he does not recall this at all.  Patient is not on any AV nodal blocking agents.  History of hypothyroidism, so we will check TSH.  Low likelihood cellulitis is contributing to current picture.  - Cardiology consulted; appreciate their recommendations - TSH pending - Pacer pads at bedside - Unlikely atropine to be useful in this scenario, however can trial if patient's heart rate worsens < 30

## 2023-02-14 NOTE — ED Notes (Signed)
Pt ate all of his meal, he was changed into a gown, call bell instructed on use and placed nearby

## 2023-02-14 NOTE — ED Notes (Signed)
This RN to bedside to introduce self to pt. Pt has slid to the bottom of the bed. This RN and another RN slid pt back to normal position in bed. Pt more comfortable. Pt resting and no complaints at this time.

## 2023-02-14 NOTE — Consult Note (Signed)
PHARMACY -  BRIEF ANTIBIOTIC NOTE   Pharmacy has received consult(s) for vancomycin dosing from an ED provider.  The patient's profile has been reviewed for ht/wt/allergies/indication/available labs.    One time order(s) placed for vancomycin 2000 mg IV x 1  Further antibiotics/pharmacy consults should be ordered by admitting physician if indicated.                       Thank you, Barrie Folk, PharmD 02/14/2023  1:14 PM

## 2023-02-14 NOTE — ED Provider Notes (Signed)
[facility] Provider Note   Event Date/Time   First MD Initiated Contact with Patient 02/14/23 1252     (approximate) History  Weakness and Wound Infection  HPI [name] [name] is a 89 y.o. male with a past medical history of chronic hypoxic respiratory failure on 5 L nasal cannula at home, CAD, and diabetes who presents via EMS for generalized weakness and was found to have symptomatic bradycardia via EMS from home for generalized weakness as well as for a wound check for a chronic ulceration to the right thigh.  Patient states that he has had the ulceration to his thigh for the last month however has only become red over the last few days. ROS: Patient currently denies any vision changes, tinnitus, difficulty speaking, facial droop, sore throat, chest pain, shortness of breath, abdominal pain, nausea/vomiting/diarrhea, dysuria, or numbness/paresthesias in any extremity   Physical Exam  Triage Vital Signs: ED Triage Vitals  Encounter Vitals Group     BP      Systolic BP Percentile      Diastolic BP Percentile      Pulse      Resp      Temp      Temp src      SpO2      Weight      Height      Head Circumference      Peak Flow      Pain Score      Pain Loc      Pain Education      Exclude from Growth Chart    Most recent vital signs: Vitals:   02/15/23 0930 02/15/23 1000  BP: (!) 145/47 (!) 151/53  Pulse:  (!) 48  Resp: (!) 24 (!) 29  Temp:    SpO2: 96% 96%   General: Awake, oriented x4. CV:  Good peripheral perfusion.  Resp:  Normal effort.  Abd:  No distention.  Other:  Elderly obese Caucasian male laying in bed with 5 L nasal cannula in place.  Sick centimeter by 8 cm area of oval erythema with induration surrounding a central area of ulceration to the distal lateral right thigh.  Resting comfortably in no acute distress ED Results / Procedures / Treatments  Labs (all labs ordered are listed, but only abnormal results are displayed) Labs  Reviewed  BRAIN NATRIURETIC PEPTIDE - Abnormal; Notable for the following components:      Result Value   B Natriuretic Peptide 158.0 (*)    All other components within normal limits  COMPREHENSIVE METABOLIC PANEL - Abnormal; Notable for the following components:   Chloride 91 (*)    CO2 38 (*)    BUN 32 (*)    Calcium 8.2 (*)    Albumin 2.5 (*)    Alkaline Phosphatase 127 (*)    All other components within normal limits  CBC WITH DIFFERENTIAL/PLATELET - Abnormal; Notable for the following components:   RBC 2.56 (*)    Hemoglobin 7.6 (*)    HCT 27.3 (*)    MCV 106.6 (*)    MCHC 27.8 (*)    All other components within normal limits  URINALYSIS, ROUTINE W REFLEX MICROSCOPIC - Abnormal; Notable for the following components:   APPearance CLEAR (*)    Bacteria, UA RARE (*)    All other components within normal limits  APTT - Abnormal; Notable for the following components:   aPTT 37 (*)    All other components within normal  limits  IRON AND TIBC - Abnormal; Notable for the following components:   Iron 15 (*)    TIBC 155 (*)    Saturation Ratios 10 (*)    All other components within normal limits  CBC WITH DIFFERENTIAL/PLATELET - Abnormal; Notable for the following components:   RBC 2.45 (*)    Hemoglobin 7.3 (*)    HCT 25.8 (*)    MCV 105.3 (*)    MCHC 28.3 (*)    All other components within normal limits  BASIC METABOLIC PANEL - Abnormal; Notable for the following components:   Chloride 95 (*)    CO2 37 (*)    BUN 28 (*)    Calcium 8.1 (*)    All other components within normal limits  RESP PANEL BY RT-PCR (RSV, FLU A&B, COVID)  RVPGX2  CULTURE, BLOOD (ROUTINE X 2)  CULTURE, BLOOD (ROUTINE X 2)  LACTIC ACID, PLASMA  LACTIC ACID, PLASMA  PROTIME-INR  TSH  FERRITIN  TROPONIN I (HIGH SENSITIVITY)  TROPONIN I (HIGH SENSITIVITY)   EKG ED ECG REPORT I, Merwyn Katos, the attending physician, personally viewed and interpreted this ECG. Date: 02/14/2023 EKG Time:  1258 Rate: 48 Rhythm: Bradycardia with 2-1 AV block QRS Axis: normal Intervals: 2-1 AV block ST/T Wave abnormalities: normal Narrative Interpretation: 2-1 AV block.  No evidence of acute ischemia  PROCEDURES: Critical Care performed: Yes, see critical care procedure note(s) Procedures MEDICATIONS ORDERED IN ED: Medications  enoxaparin (LOVENOX) injection 50 mg (50 mg Subcutaneous Given 02/14/23 2346)  sodium chloride flush (NS) 0.9 % injection 3 mL (3 mLs Intravenous Given 02/15/23 0936)  acetaminophen (TYLENOL) tablet 650 mg (has no administration in time range)    Or  acetaminophen (TYLENOL) suppository 650 mg (has no administration in time range)  polyethylene glycol (MIRALAX / GLYCOLAX) packet 17 g (has no administration in time range)  ondansetron (ZOFRAN) tablet 4 mg (has no administration in time range)    Or  ondansetron (ZOFRAN) injection 4 mg (has no administration in time range)  albuterol (PROVENTIL) (2.5 MG/3ML) 0.083% nebulizer solution 3 mL (has no administration in time range)  furosemide (LASIX) tablet 40 mg (40 mg Oral Given 02/15/23 0935)  aspirin EC tablet 81 mg (81 mg Oral Given 02/15/23 0935)  levothyroxine (SYNTHROID) tablet 25 mcg (25 mcg Oral Given 02/15/23 0706)  gabapentin (NEURONTIN) capsule 100-200 mg (has no administration in time range)  mometasone-formoterol (DULERA) 200-5 MCG/ACT inhaler 2 puff (has no administration in time range)  LORazepam (ATIVAN) tablet 0.5 mg (has no administration in time range)  cefTRIAXone (ROCEPHIN) 2 g in sodium chloride 0.9 % 100 mL IVPB (has no administration in time range)  vancomycin (VANCOREADY) IVPB 1500 mg/300 mL (has no administration in time range)  sodium chloride 0.9 % bolus 1,000 mL (0 mLs Intravenous Stopped 02/14/23 1529)  cefTRIAXone (ROCEPHIN) 2 g in sodium chloride 0.9 % 100 mL IVPB (0 g Intravenous Stopped 02/14/23 1529)  vancomycin (VANCOREADY) IVPB 2000 mg/400 mL (0 mg Intravenous Stopped 02/14/23 1830)    IMPRESSION / MDM / ASSESSMENT AND PLAN / ED COURSE  I reviewed the triage vital signs and the nursing notes.                             The patient is on the cardiac monitor to evaluate for evidence of arrhythmia and/or significant heart rate changes. Patient's presentation is most consistent with acute presentation with potential threat to life  or bodily function.  This patient presents to the ED for concern of generalized weakness and right thigh wound, this involves an extensive number of treatment options, and is a complaint that carries with it a high risk of complications and morbidity.  The differential diagnosis includes ACS, arrhythmia, AV block, cellulitis, sepsis Co morbidities that complicate the patient evaluation  COPD, CHF, ACS, peripheral vascular disease Additional history obtained:  Additional history obtained from patient and patient's caregiver  External records from outside source obtained and reviewed including most recent family medicine e-visit from 02/13/2023 Lab Tests:  I Ordered, and personally interpreted labs.  The pertinent results include: Anemia to 7.3, BNP 158 Cardiac Monitoring: / EKG:  The patient was maintained on a cardiac monitor.  I personally viewed and interpreted the cardiac monitored which showed an underlying rhythm of: Bradycardia with 2-1 AV block Consultations Obtained:  I requested consultation with the Dr. Duke Salvia in cardiology,  and discussed lab and imaging findings as well as pertinent plan - they recommend: No intervention at this time given patient's pressure is stable and treatment for patient's cellulitis Problem List / ED Course / Critical interventions / Medication management  2-1 AV block with bradycardia, right thigh cellulitis  I have reviewed the patients home medicines and have made  Dispo: Admit to medicine       FINAL CLINICAL IMPRESSION(S) / ED DIAGNOSES   Final diagnoses:  2nd degree AV block  Cellulitis  of right lower extremity   Rx / DC Orders   ED Discharge Orders     None      Note:  This document was prepared using Dragon voice recognition software and may include unintentional dictation errors.   Merwyn Katos, MD 02/15/23 1126

## 2023-02-14 NOTE — Assessment & Plan Note (Signed)
No evidence of frank hypervolemia at this time.  - Resume home oral Lasix tomorrow

## 2023-02-15 ENCOUNTER — Observation Stay
Admit: 2023-02-15 | Discharge: 2023-02-15 | Disposition: A | Discharge status: Home / Self Care | Payer: PPO | Attending: Cardiology | Admitting: Cardiology

## 2023-02-15 ENCOUNTER — Inpatient Hospital Stay: Payer: PPO

## 2023-02-15 DIAGNOSIS — D649 Anemia, unspecified: Secondary | ICD-10-CM | POA: Diagnosis not present

## 2023-02-15 DIAGNOSIS — R918 Other nonspecific abnormal finding of lung field: Secondary | ICD-10-CM | POA: Diagnosis not present

## 2023-02-15 DIAGNOSIS — R6889 Other general symptoms and signs: Secondary | ICD-10-CM | POA: Diagnosis not present

## 2023-02-15 DIAGNOSIS — J439 Emphysema, unspecified: Secondary | ICD-10-CM | POA: Diagnosis not present

## 2023-02-15 DIAGNOSIS — E8729 Other acidosis: Secondary | ICD-10-CM | POA: Diagnosis not present

## 2023-02-15 DIAGNOSIS — J438 Other emphysema: Secondary | ICD-10-CM | POA: Diagnosis not present

## 2023-02-15 DIAGNOSIS — Z79890 Hormone replacement therapy (postmenopausal): Secondary | ICD-10-CM | POA: Diagnosis not present

## 2023-02-15 DIAGNOSIS — I441 Atrioventricular block, second degree: Secondary | ICD-10-CM

## 2023-02-15 DIAGNOSIS — I10 Essential (primary) hypertension: Secondary | ICD-10-CM | POA: Diagnosis not present

## 2023-02-15 DIAGNOSIS — Z79899 Other long term (current) drug therapy: Secondary | ICD-10-CM | POA: Diagnosis not present

## 2023-02-15 DIAGNOSIS — L97119 Non-pressure chronic ulcer of right thigh with unspecified severity: Secondary | ICD-10-CM | POA: Diagnosis not present

## 2023-02-15 DIAGNOSIS — I2510 Atherosclerotic heart disease of native coronary artery without angina pectoris: Secondary | ICD-10-CM | POA: Diagnosis not present

## 2023-02-15 DIAGNOSIS — L97529 Non-pressure chronic ulcer of other part of left foot with unspecified severity: Secondary | ICD-10-CM | POA: Diagnosis not present

## 2023-02-15 DIAGNOSIS — L089 Local infection of the skin and subcutaneous tissue, unspecified: Secondary | ICD-10-CM | POA: Diagnosis not present

## 2023-02-15 DIAGNOSIS — Z66 Do not resuscitate: Secondary | ICD-10-CM | POA: Diagnosis not present

## 2023-02-15 DIAGNOSIS — W57XXXA Bitten or stung by nonvenomous insect and other nonvenomous arthropods, initial encounter: Secondary | ICD-10-CM | POA: Diagnosis present

## 2023-02-15 DIAGNOSIS — L03115 Cellulitis of right lower limb: Principal | ICD-10-CM

## 2023-02-15 DIAGNOSIS — I714 Abdominal aortic aneurysm, without rupture, unspecified: Secondary | ICD-10-CM | POA: Diagnosis not present

## 2023-02-15 DIAGNOSIS — I517 Cardiomegaly: Secondary | ICD-10-CM | POA: Diagnosis not present

## 2023-02-15 DIAGNOSIS — I272 Pulmonary hypertension, unspecified: Secondary | ICD-10-CM | POA: Diagnosis not present

## 2023-02-15 DIAGNOSIS — Z9981 Dependence on supplemental oxygen: Secondary | ICD-10-CM | POA: Diagnosis not present

## 2023-02-15 DIAGNOSIS — J9611 Chronic respiratory failure with hypoxia: Secondary | ICD-10-CM | POA: Diagnosis not present

## 2023-02-15 DIAGNOSIS — R0602 Shortness of breath: Secondary | ICD-10-CM | POA: Diagnosis not present

## 2023-02-15 DIAGNOSIS — D638 Anemia in other chronic diseases classified elsewhere: Secondary | ICD-10-CM | POA: Diagnosis not present

## 2023-02-15 DIAGNOSIS — I110 Hypertensive heart disease with heart failure: Secondary | ICD-10-CM | POA: Diagnosis not present

## 2023-02-15 DIAGNOSIS — Z953 Presence of xenogenic heart valve: Secondary | ICD-10-CM | POA: Diagnosis not present

## 2023-02-15 DIAGNOSIS — Z515 Encounter for palliative care: Secondary | ICD-10-CM | POA: Diagnosis not present

## 2023-02-15 DIAGNOSIS — Z1152 Encounter for screening for COVID-19: Secondary | ICD-10-CM | POA: Diagnosis not present

## 2023-02-15 DIAGNOSIS — J9612 Chronic respiratory failure with hypercapnia: Secondary | ICD-10-CM | POA: Diagnosis not present

## 2023-02-15 DIAGNOSIS — I5033 Acute on chronic diastolic (congestive) heart failure: Secondary | ICD-10-CM | POA: Diagnosis not present

## 2023-02-15 DIAGNOSIS — Z952 Presence of prosthetic heart valve: Secondary | ICD-10-CM | POA: Diagnosis not present

## 2023-02-15 DIAGNOSIS — E039 Hypothyroidism, unspecified: Secondary | ICD-10-CM | POA: Diagnosis not present

## 2023-02-15 DIAGNOSIS — M869 Osteomyelitis, unspecified: Secondary | ICD-10-CM | POA: Diagnosis not present

## 2023-02-15 DIAGNOSIS — R531 Weakness: Secondary | ICD-10-CM | POA: Diagnosis not present

## 2023-02-15 DIAGNOSIS — I5032 Chronic diastolic (congestive) heart failure: Secondary | ICD-10-CM | POA: Diagnosis not present

## 2023-02-15 DIAGNOSIS — Z7401 Bed confinement status: Secondary | ICD-10-CM | POA: Diagnosis not present

## 2023-02-15 LAB — CBC WITH DIFFERENTIAL/PLATELET
Abs Immature Granulocytes: 0.03 10*3/uL (ref 0.00–0.07)
Basophils Absolute: 0.1 10*3/uL (ref 0.0–0.1)
Basophils Relative: 1 %
Eosinophils Absolute: 0.4 10*3/uL (ref 0.0–0.5)
Eosinophils Relative: 4 %
HCT: 25.8 % — ABNORMAL LOW (ref 39.0–52.0)
Hemoglobin: 7.3 g/dL — ABNORMAL LOW (ref 13.0–17.0)
Immature Granulocytes: 0 %
Lymphocytes Relative: 16 %
Lymphs Abs: 1.5 10*3/uL (ref 0.7–4.0)
MCH: 29.8 pg (ref 26.0–34.0)
MCHC: 28.3 g/dL — ABNORMAL LOW (ref 30.0–36.0)
MCV: 105.3 fL — ABNORMAL HIGH (ref 80.0–100.0)
Monocytes Absolute: 0.7 10*3/uL (ref 0.1–1.0)
Monocytes Relative: 8 %
Neutro Abs: 6.6 10*3/uL (ref 1.7–7.7)
Neutrophils Relative %: 71 %
Platelets: 255 10*3/uL (ref 150–400)
RBC: 2.45 MIL/uL — ABNORMAL LOW (ref 4.22–5.81)
RDW: 14.5 % (ref 11.5–15.5)
WBC: 9.3 10*3/uL (ref 4.0–10.5)
nRBC: 0 % (ref 0.0–0.2)

## 2023-02-15 LAB — BASIC METABOLIC PANEL
Anion gap: 8 (ref 5–15)
BUN: 28 mg/dL — ABNORMAL HIGH (ref 8–23)
CO2: 37 mmol/L — ABNORMAL HIGH (ref 22–32)
Calcium: 8.1 mg/dL — ABNORMAL LOW (ref 8.9–10.3)
Chloride: 95 mmol/L — ABNORMAL LOW (ref 98–111)
Creatinine, Ser: 1.04 mg/dL (ref 0.61–1.24)
GFR, Estimated: >60 mL/min (ref >60)
Glucose, Bld: 76 mg/dL (ref 70–99)
Potassium: 4.2 mmol/L (ref 3.5–5.1)
Sodium: 140 mmol/L (ref 135–145)

## 2023-02-15 LAB — BLOOD GAS, ARTERIAL
Acid-Base Excess: 13.5 mmol/L — ABNORMAL HIGH (ref 0.0–2.0)
Bicarbonate: 45.3 mmol/L — ABNORMAL HIGH (ref 20.0–28.0)
O2 Saturation: 99.3 %
Patient temperature: 37
pCO2 arterial: 101 mmHg (ref 32–48)
pH, Arterial: 7.26 — ABNORMAL LOW (ref 7.35–7.45)
pO2, Arterial: 145 mmHg — ABNORMAL HIGH (ref 83–108)

## 2023-02-15 LAB — ECHOCARDIOGRAM COMPLETE
AR max vel: 1.31 cm2
AV Area VTI: 1.53 cm2
AV Area mean vel: 1.56 cm2
AV Mean grad: 25 mmHg
AV Peak grad: 53.6 mmHg
Ao pk vel: 3.66 m/s
Area-P 1/2: 6.54 cm2
Height: 70 in
MV VTI: 2.6 cm2
S' Lateral: 4.2 cm
Weight: 3520.31 [oz_av]

## 2023-02-15 MED ORDER — GADOBUTROL 1 MMOL/ML IV SOLN
10.0000 mL | Freq: Once | INTRAVENOUS | Status: AC | PRN
  Administered 2023-02-15: 10 mL via INTRAVENOUS

## 2023-02-15 NOTE — ED Notes (Signed)
Pt cleaned of incontinence and bed linens changed

## 2023-02-15 NOTE — Consult Note (Incomplete)
WOC Nurse Consult Note: Reason for Consult:right thigh, left toe Wound type:infectious Suspected envenomation; however has worsened despite oral antibiotic   Full thickness right thigh Pressure Injury POA: NA Measurement: see nursing flow sheets 1cm x 1cm x 0.5cm  Wound bed: 50% yellow/50% pink Drainage (amount, consistency, odor) serosanguinous, scant per nursing notes Periwound:erythema  Dressing procedure/placement/frequency: Right thigh Cleanse with Vashe solution [employer] [place] # [phone]), cut to fit silver hydrofiber (Aquacel [employer] # [id number]) and place in wound bed, top with foam Change every other day  FU on toe wound, scabbed, bedside nursing cleansed and placed silicone foam dressing per the nursing skin care order set.   Re consult if needed, will not follow at this time. Thanks  Berda Shelvin [employer], RN,CWOCN, CNS, CWON-AP [phone])

## 2023-02-15 NOTE — Consult Note (Signed)
Cardiology Consultation   Patient ID: SHAIL HARTUNIAN MRN: [id number]; DOB: Aug 09, 1933  Admit date: 02/14/2023 Date of Consult: 02/15/2023  PCP:  Joaquim Nam, MD   [facility] Providers Cardiologist:  Yvonne Kendall, MD  Electrophysiologist:  Lanier Prude, MD       Patient Profile:   ABID FASS is a 89 y.o. male with a hx of severe aortic stenosis status post TAVR (06/2015), chronic HFpEF, 2:1 AV block, chronic respiratory failure with hypoxia, COPD, AAA, obstructive sleep apnea, and morbid obesity who is being seen 02/15/2023 for the evaluation of 2:1 AV block and weakness at the request of Dr Alvino Chapel.  History of Present Illness:   Mr. Berrigan he suffers from chronic dyspnea requiring 5 L of supplemental oxygen. He is last seen in clinic/12/23 by Dr. Okey Dupre.  At that time Mr. Hochstein was slowly returning to his baseline as he had had a respiratory infection in December prior to his visit which his wife believed was COVID infection.  He continued to suffer from chronic shortness of breath that was back to baseline.  He was found to be euvolemic on exam and there were no other changes made to his medication regimen or further testing that was ordered.  He was previously evaluated by EP Dr. Lalla Brothers for occasional transient dizziness noted to be otherwise asymptomatic.  After visit with EP the decision was made to defer place maker placement given scarcity of symptoms and other comorbidities.  Patient presented to the [facility] emergency department on 02/14/23 via EMS from home for generalized weakness and a wound check.  The patient's home dose reported to EMS the patient had been weak for the past couple of days and noticed the wound on his right thigh was red and hot to the touch.  Patient was alert and oriented with no further complaints other than that he often gets confused but is easily reoriented.  He had also been continued on his baseline of 5 L of O2 via nasal cannula at home.  In  the emergency department there was concern for symptomatic bradycardia via EMS for the generalized weakness.  He was noted to have a chronic ulceration to the right thigh.  Patient and his family stated the ulceration had been twisted off over the last month however it only become red over the last several days.  He denied any chest pain, worsening shortness of breath, lightheaded, dizziness, or syncopal episodes.  Initial vital signs: Blood pressure 154/64, rate of 47, oxygen saturations of 99%, temp of 98  Pertinent labs: Hemoglobin 7.6, bicarb of 38, BUN of 32, serum creatinine 1.14, alkaline phosphate of 127, BUN 2.5, GFR 60, high-sensitivity troponin 12, BNP 158, urinalysis and viral panel were negative  Cardiology was consulted for second-degree Mobitz type II AV block with the patient presenting with several 3-day generalized weakness.   Past Medical History:  Diagnosis Date   AAA (abdominal aortic aneurysm) (HCC)    a. 3.8 cm CTA 05/2015 b. 3.9 cm by [place] in 11/2017   Anxiety    COPD (chronic obstructive pulmonary disease) (HCC)    on home O2 4LPM   Diverticulosis    Former smoker    quit 2001   Hemorrhoids    Kidney stones    passed spontaneously   OSA on CPAP    last study- in the home possibility- 2015, pt. unsure    Prediabetes    Hgb A1C 5.8 in Jan 2017   PUD (  peptic ulcer disease)    Right knee DJD 10/31/2011   Severe   S/P TAVR (transcatheter aortic valve replacement) 06/26/2015   26 mm Edwards Sapien 3 transcatheter heart valve placed via percutaneous right transfemoral approach   Severe aortic stenosis    a. Echo 11/16:  Mod LVH, EF 55-60%, no RWMA, Gr 1 DD, severe AS (mean 51 mmHg; peak 86 mmHg), MAC, mild LAE  b. s/p TAVR on 06/26/2015 with 26mm Edward Sapien 3 THV   Shortness of breath dyspnea     Past Surgical History:  Procedure Laterality Date   CARDIAC CATHETERIZATION N/A 05/15/2015   Procedure: Right/Left Heart Cath and Coronary Angiography;  Surgeon: Tonny Bollman, MD;  Location: [facility] INVASIVE CV LAB;  Service: Cardiovascular;  Laterality: N/A;   CHOLECYSTECTOMY     EYE SURGERY     bilateral cataracts removed, /w IOL   KNEE SURGERY Right 1985   arthrosopic -    TEE WITHOUT CARDIOVERSION N/A 06/26/2015   Procedure: TRANSESOPHAGEAL ECHOCARDIOGRAM (TEE);  Surgeon: Tonny Bollman, MD;  Location: [facility] OR;  Service: Open Heart Surgery;  Laterality: N/A;   TONSILLECTOMY     TRANSCATHETER AORTIC VALVE REPLACEMENT, TRANSFEMORAL N/A 06/26/2015   Procedure: TRANSCATHETER AORTIC VALVE REPLACEMENT, TRANSFEMORAL;  Surgeon: Tonny Bollman, MD;  Location: [facility] OR;  Service: Open Heart Surgery;  Laterality: N/A;     Home Medications:  Prior to Admission medications   Medication Sig Start Date End Date Taking? Authorizing Provider  albuterol (PROAIR HFA) 108 (90 Base) MCG/ACT inhaler INHALE 1-2 PUFFS INTO THE LUNGS EVERY 6 (SIX) HOURS AS NEEDED FOR WHEEZING. 07/08/21  Yes Joaquim Nam, MD  aspirin EC 81 MG tablet Take 1 tablet (81 mg total) by mouth daily. 04/24/15  Yes Weaver, Scott T, PA-C  furosemide (LASIX) 40 MG tablet TAKE 1 TABLET BY MOUTH EVERY DAY 10/03/22  Yes Joaquim Nam, MD  gabapentin (NEURONTIN) 100 MG capsule TAKE 1-2 CAPSULES (100-200 MG TOTAL) BY MOUTH 3 (THREE) TIMES DAILY AS NEEDED (FOR PAIN). 01/14/23  Yes Joaquim Nam, MD  levothyroxine (SYNTHROID) 25 MCG tablet Take 1 tablet (25 mcg total) by mouth daily. 09/05/22  Yes Joaquim Nam, MD  loperamide (IMODIUM A-D) 2 MG tablet Take 1 tablet (2 mg total) by mouth 4 (four) times daily as needed for diarrhea or loose stools. 11/28/22  Yes Joaquim Nam, MD  loratadine (CLARITIN) 10 MG tablet Take 1 tablet (10 mg total) by mouth daily. 01/20/23  Yes Joaquim Nam, MD  LORazepam (ATIVAN) 1 MG tablet TAKE 1/4 TO 1/2 TABLET BY MOUTH EVERY 6 HOURS AS NEEDED 11/14/22  Yes Joaquim Nam, MD  neomycin-bacitracin-polymyxin [phone] OINT Apply 1 Application topically daily. 01/20/23  Yes Joaquim Nam, MD  SYMBICORT 160-4.5 MCG/ACT inhaler TAKE 2 PUFFS BY MOUTH TWICE A DAY 06/30/22  Yes Joaquim Nam, MD  triamcinolone cream (KENALOG) 0.5 % Apply 1 Application topically 2 (two) times daily as needed. 09/04/22  Yes Joaquim Nam, MD  Vitamin D, Ergocalciferol, (DRISDOL) 1.25 MG (50000 UNIT) CAPS capsule Take 1 capsule by mouth once weekly for 12 weeks. 09/05/22  Yes Joaquim Nam, MD  OXYGEN Inhale into the lungs. 5 Liters    [provider]  Respiratory Therapy Supplies (FLUTTER) DEVI Use as directed 02/23/12   Clance, Maree Krabbe, MD    Inpatient Medications: Scheduled Meds:  aspirin EC  81 mg Oral Daily   enoxaparin (LOVENOX) injection  50 mg Subcutaneous Q24H  furosemide  40 mg Oral Daily   levothyroxine  25 mcg Oral Q0600   mometasone-formoterol  2 puff Inhalation BID   sodium chloride flush  3 mL Intravenous Q12H   Continuous Infusions:  cefTRIAXone (ROCEPHIN)  IV     vancomycin     PRN Meds: acetaminophen **OR** acetaminophen, albuterol, gabapentin, LORazepam, ondansetron **OR** ondansetron (ZOFRAN) IV, polyethylene glycol  Allergies:    Allergies  Allergen Reactions   Iohexol Hives and Swelling    Pt reports swelling, redness, hives, and blisters    Atorvastatin     REACTION: aches   Celecoxib     REACTION: rash   Clinoril [Sulindac]     REACTION: rash   Nitrofurantoin    Spiriva Handihaler [Tiotropium Bromide Monohydrate] Other (See Comments)    Dry mouth   Sulfa Antibiotics     Intolerant but unrecalled.     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  Tobacco Use   Smoking status: Former    Current packs/day: 0.00    Average packs/day: 1.5 packs/day for 53.0  years (79.5 ttl pk-yrs)    Types: Cigarettes    Start date: 06/22/1946    Quit date: 06/23/1999    Years since quitting: 23.6   Smokeless tobacco: Former    Types: Snuff, Chew  Vaping Use   Vaping status: Never Used  Substance and Sexual Activity   Alcohol use: No   Drug use: No   Sexual activity: Not Currently    Partners: Female  Other Topics Concern   Not on file  Social History Narrative   Retired, print shop   Married 1958   1 daughter   Former smoker   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     Social Determinants of Health   Financial Resource Strain: Low Risk  (12/30/2022)   Overall Financial Resource Strain (CARDIA)    Difficulty of Paying Living Expenses: Not hard at all  Food Insecurity: No Food Insecurity (12/30/2022)   Hunger Vital Sign    Worried About Running Out of Food in the Last Year: Never true    Ran Out of Food in the Last Year: Never true  Transportation Needs: Unmet Transportation Needs (12/30/2022)   PRAPARE - Administrator, Civil Service (Medical): Yes    Lack of Transportation (Non-Medical): Yes  Physical Activity: Inactive (12/30/2022)   Exercise Vital Sign    Days of Exercise per Week: 0 days    Minutes of Exercise per Session: 0 min  Stress: No Stress Concern Present (12/30/2022)   [employer] of Occupational Health - Occupational Stress Questionnaire    Feeling of Stress : Not at all  Social Connections: Moderately Integrated (12/30/2022)   Social Connection and Isolation Panel [NHANES]    Frequency of Communication with Friends and Family: More than three times a week    Frequency of Social Gatherings with Friends and Family: Never    Attends Religious Services: 1 to 4 times per year    Active Member of [employer] or Organizations: No    Attends Banker Meetings: Never    Marital Status: Married  Catering manager Violence: Not  At Risk (12/30/2022)   Humiliation, Afraid, Rape, and Kick questionnaire    Fear of Current  or Ex-Partner: No    Emotionally Abused: No    Physically Abused: No    Sexually Abused: No    Family History:    Family History  Problem Relation Age of Onset   Stroke Mother    Heart attack Mother 75       deceased   Prostate cancer Father 72   Prostate cancer Brother    Colon cancer Neg Hx      ROS:  Please see the history of present illness.  Review of Systems  Constitutional:  Positive for malaise/fatigue.  Respiratory:  Positive for shortness of breath.   Skin:        Wounds with concerning areas of cellulitis   Neurological:  Positive for weakness.    All other ROS reviewed and negative.     Physical Exam/Data:   Vitals:   02/15/23 0830 02/15/23 0900 02/15/23 0930 02/15/23 1000  BP: (!) 151/56 (!) 163/44 (!) 145/47 (!) 151/53  Pulse:    (!) 48  Resp: (!) 22 20 (!) 24 (!) 29  Temp:      TempSrc:      SpO2:   96% 96%  Weight:      Height:        Intake/Output Summary (Last 24 hours) at 02/15/2023 1024 Last data filed at 02/14/2023 1529 Gross per 24 hour  Intake 1200 ml  Output --  Net 1200 ml      02/14/2023    1:06 PM 12/30/2022    2:05 PM 06/10/2022    7:27 PM  Last 3 Weights  Weight (lbs) 220 lb 0.3 oz 220 lb 220 lb  Weight (kg) 99.8 kg 99.791 kg 99.791 kg     Body mass index is 31.57 kg/m.  General: Chronically ill appearing, in no acute distress, obese male HEENT: normal Neck: unable to determine Due to body habitus JVD Vascular: No carotid bruits; Distal pulses 2+ bilaterally Cardiac:  normal S1, S2; RRR; no murmur  Lungs:  clear upper lobes with diminished bases with forced expiratory wheezing noted throughout to auscultation bilaterally, respirations are unlabored at rest on 5 L of O2 via nasal cannula Abd: soft, nontender, no hepatomegaly  Ext: 1+ edema to BLE Musculoskeletal:  No deformities, BUE and BLE strength normal and equal Skin: warm and dry, right mid thigh he has a 1 cm ulceration with purulent drainage, red and inflamed, there  is also an ulcer to the left great toe Neuro:  CNs 2-12 intact, no focal abnormalities noted Psych:  Normal affect   EKG:  The EKG was personally reviewed and demonstrates: 2: 1 AV block Mobitz Type II with a rate of 48 Telemetry:  Telemetry was personally reviewed and demonstrates: 2: 1 AV block Mobitz type II with rates 40-50 and occasional PACs  Relevant CV Studies: TTE 04/11/21 1. Left ventricular ejection fraction, by estimation, is 60 to 65%. The  left ventricle has normal function. The left ventricle has no regional  wall motion abnormalities. Left ventricular diastolic parameters are  consistent with Grade I diastolic  dysfunction (impaired relaxation).   2. Right ventricular systolic function is normal. The right ventricular  size is normal.   3. The mitral valve was not well visualized. Mild to moderate mitral  valve regurgitation. Borderline to mild mitral stenosis.   4. The aortic valve was not well visualized. Aortic valve regurgitation  is not  visualized. Mild aortic valve stenosis. There is a 26 mm Edwards  Sapien prosthetic (TAVR) valve present in the aortic position. Procedure  Date: 06/26/2015. Aortic valve area,   by VTI measures 1.56 cm. Aortic valve mean gradient measures 14.2 mmHg.  Aortic valve Vmax measures 2.64 m/s.   5. The inferior vena cava is normal in size with greater than 50%  respiratory variability, suggesting right atrial pressure of 3 mmHg.   6. Challenging images   Laboratory Data:  High Sensitivity Troponin:   Recent Labs  Lab 02/14/23 1345 02/14/23 1533  TROPONINIHS 12 12     Chemistry Recent Labs  Lab 02/14/23 1345 02/15/23 0640  NA 140 140  K 4.3 4.2  CL 91* 95*  CO2 38* 37*  GLUCOSE 91 76  BUN 32* 28*  CREATININE 1.14 1.04  CALCIUM 8.2* 8.1*  GFRNONAA >60 >60  ANIONGAP 11 8    Recent Labs  Lab 02/14/23 1345  PROT 6.6  ALBUMIN 2.5*  AST 20  ALT 13  ALKPHOS 127*  BILITOT 0.6   Lipids No results for input(s):  "CHOL", "TRIG", "HDL", "LABVLDL", "LDLCALC", "CHOLHDL" in the last 168 hours.  Hematology Recent Labs  Lab 02/14/23 1345 02/15/23 0640  WBC 9.6 9.3  RBC 2.56* 2.45*  HGB 7.6* 7.3*  HCT 27.3* 25.8*  MCV 106.6* 105.3*  MCH 29.7 29.8  MCHC 27.8* 28.3*  RDW 14.5 14.5  PLT 268 255   Thyroid  Recent Labs  Lab 02/14/23 1533  TSH 2.255    BNP Recent Labs  Lab 02/14/23 1345  BNP 158.0*    DDimer No results for input(s): "DDIMER" in the last 168 hours.   Radiology/Studies:  No results found.   Assessment and Plan:   2: 1 AV block, Mobitz type II -Patient presented with 3 days of generalized weakness -Transient Mobitz type II has been common occurrence since his TAVR in 2017 -Patient is typically asymptomatic -He denies any lightheadedness dizziness syncope or near syncopal episodes -Had previously had follow-up with EP and permanent pacemaker placement was deferred due to low amount of symptoms he was having and limited mobility -Continue on telemetry monitoring -Continue to avoid AV nodal blocking agents including antiemetics -Patient is currently asymptomatic -Currently no indication for pacemaker placement especially with active infection -Will likely be able to follow-up with EP again as outpatient  Right thigh wound/cellulitis -Continued on antibiotic therapy -Wound care consultation placed by primary team -Continue management per IM  Chronic HFpEF -BNP 158.0 -Patient on chronic 5 L of O2 via nasal cannula maintaining oxygen saturations well -Edema to the lower extremities -Continued on furosemide 40 mg daily -Echocardiogram ordered and pending with further recommendations to follow -Not a candidate for SGLT2 inhibitors due to frequent incontinence and bedridden or wheelchair-bound state -Heart failure education -Daily weights, I's and O's, low-sodium diet  Lung nodule -Chronic right upper lobe nodule that was measured 4 mm in 2016 -8 months prior on  previous hospitalization in setting of sepsis it is enlarged to 2.5 x 3.9 cm -Given the patient's history was considered more consistent with primary lung cancer question possibly adenocarcinoma given chronicity -Primary team has discussed this with Mr., He is not interested in pursuing any sort of treatment  Aortic stenosis status post TAVR -Without previous complications -Echocardiogram ordered and pending -Recommend continuing on aspirin 81 mg daily  Chronic respiratory failure with hypoxia due to COPD -Continued on 5 L O2 via nasal cannula at baseline -No evidence of COPD exacerbation -Continue  with home bronchodilators  Hypertension -Blood pressure 151/53 -Previously had been on low-dose losartan -Vital signs per unit protocol  8.    Hypothyroidism -Continued on levothyroxine   Risk Assessment/Risk Scores:                For questions or updates, please contact [facility] Please consult [email or web address] for contact info under    Signed, Colman Birdwell, NP  02/15/2023 10:24 AM

## 2023-02-15 NOTE — Progress Notes (Signed)
  PROGRESS NOTE  Patient re-evaluated in ED. BiPAP in place. Wife, brother, sister-in-law at bedside. He is comfortable appearing, arousable, says he feels "terrible." Continue current care.    Noralee Stain, DO [facility] 02/15/2023, 4:17 PM  Available via Epic secure chat 7am-7pm After these hours, please refer to coverage provider listed on [email or web address]

## 2023-02-15 NOTE — Progress Notes (Signed)
OT Cancellation Note  Patient Details Name: [name] [name] MRN: [id number] DOB: 1934/05/02   Cancelled Treatment:    Reason Eval/Treat Not Completed: OT screened, no needs identified, will sign off (Per PT, pt is MAX A-dependent for transfers at baseline; mostly bed level care. OT discussed with dtr at bedside; pt able to use UE functionally for eating/grooming/using urinal bed level; dependent for all other care. This is his baseline. No OT needs.)  Alvester Morin 02/15/2023, 9:09 AM

## 2023-02-15 NOTE — Progress Notes (Signed)
  Echocardiogram 2D Echocardiogram has been performed.  Hale Chalfin C Keyandra Swenson 02/15/2023, 1:28 PM

## 2023-02-15 NOTE — Progress Notes (Addendum)
PROGRESS NOTE    [name] [name]  ZOX:[id number] DOB: 02-Sep-1933 DOA: 02/14/2023 PCP: Joaquim Nam, MD     Brief Narrative:  [name] [name] is a 89 y.o. male with medical history significant of chronic hypoxic respiratory failure on 5 L secondary to COPD, AV heart block, OSA on CPAP, severe aortic stenosis s/p TAVR, PUD, prediabetes, HFpEF, who presents to the ED due to generalized weakness and a wound.   Additionally, had a small wound in the lateral aspect of the right leg, with increasing erythema.  Also with left great toe wound.  In the emergency department, patient was found to have 2:1 AV block.  Cardiology was consulted.   New events last 24 hours / Subjective: Patient seen in emergency department with family at bedside.  Family notes that patient is somewhat more confused.  His main complaint is weakness.  Assessment & Plan:   Principal Problem:   Second degree Mobitz II AV block Active Problems:   Cellulitis   Generalized weakness   Normocytic anemia   Lung nodule   COPD (chronic obstructive pulmonary disease) with emphysema (HCC)   Chronic respiratory failure with hypoxia (HCC)   Chronic heart failure with preserved ejection fraction (HFpEF) (HCC)   S/P TAVR (transcatheter aortic valve replacement)   Second-degree AV block -Cardiology consulted -Avoid AV nodal blocking agents, currently no indication for pacemaker placement -Need to monitor on telemetry  Cellulitis of right lateral thigh -Continue vancomycin, ceftriaxone -Wound RN  Left great toe wound -MRI to rule out osteo   Symptomatic anemia -Baseline hemoglobin between 9 and 10 no reported history of bleeding -Iron panel consistent with anemia of chronic disease   Lung nodule -Has been enlarging since previous imaging in 2016.  Had previously declined repeat imaging or pursuing any sort of treatment  COPD with chronic hypoxic, hypercapnic respiratory failure  -Without exacerbation -Checking ABG to  see CO2 retention --> CO2 101 with pH 7.26. Change to stepdown unit and ordered BiPAP for hypercapnic resp failure   Hypothyroidism -Synthroid   Chronic diastolic heart failure -Lasix  Status post TAVR -Aspirin    DVT prophylaxis: Lovenox    Code Status: DNR Family Communication: At bedside  Disposition Plan: Home Status is: Observation The patient will require care spanning > 2 midnights and should be moved to inpatient because: IV antibiotics     Antimicrobials:  Anti-infectives (From admission, onward)    Start     Dose/Rate Route Frequency Ordered Stop   02/15/23 1530  vancomycin (VANCOREADY) IVPB 1500 mg/300 mL        1,500 mg 150 mL/hr over 120 Minutes Intravenous Every 24 hours 02/14/23 1830     02/15/23 1400  cefTRIAXone (ROCEPHIN) 2 g in sodium chloride 0.9 % 100 mL IVPB        2 g 200 mL/hr over 30 Minutes Intravenous Every 24 hours 02/14/23 1826     02/14/23 1315  vancomycin (VANCOCIN) IVPB 1000 mg/200 mL premix  Status:  Discontinued        1,000 mg 200 mL/hr over 60 Minutes Intravenous  Once 02/14/23 1313 02/14/23 1313   02/14/23 1315  cefTRIAXone (ROCEPHIN) 2 g in sodium chloride 0.9 % 100 mL IVPB        2 g 200 mL/hr over 30 Minutes Intravenous  Once 02/14/23 1313 02/14/23 1529   02/14/23 1315  vancomycin (VANCOREADY) IVPB 2000 mg/400 mL        2,000 mg 200 mL/hr over 120 Minutes Intravenous  Once 02/14/23 1314 02/14/23 1830        Objective: Vitals:   02/15/23 1030 02/15/23 1100 02/15/23 1130 02/15/23 1200  BP: (!) 141/45 (!) 136/50 (!) 139/44 (!) 166/51  Pulse:      Resp:      Temp:      TempSrc:      SpO2:      Weight:      Height:        Intake/Output Summary (Last 24 hours) at 02/15/2023 1335 Last data filed at 02/14/2023 1529 Gross per 24 hour  Intake 1000 ml  Output --  Net 1000 ml   Filed Weights   02/14/23 1306  Weight: 99.8 kg    Examination:  General exam: Appears calm and comfortable, fatigued  Respiratory system: Clear  to auscultation. Respiratory effort normal. No respiratory distress. No conversational dyspnea.  Cardiovascular system: S1 & S2 heard. No murmurs. No pedal edema. Gastrointestinal system: Abdomen is nondistended, soft and nontender. Normal bowel sounds heard. Central nervous system: Alert  Extremities: +Right lateral thigh with wound draining serous fluid, surrounding erythema, left great toe with nondraining eschar wound   Data Reviewed: I have personally reviewed following labs and imaging studies  CBC: Recent Labs  Lab 02/14/23 1345 02/15/23 0640  WBC 9.6 9.3  NEUTROABS 7.0 6.6  HGB 7.6* 7.3*  HCT 27.3* 25.8*  MCV 106.6* 105.3*  PLT 268 255   Basic Metabolic Panel: Recent Labs  Lab 02/14/23 1345 02/15/23 0640  NA 140 140  K 4.3 4.2  CL 91* 95*  CO2 38* 37*  GLUCOSE 91 76  BUN 32* 28*  CREATININE 1.14 1.04  CALCIUM 8.2* 8.1*   GFR: Estimated Creatinine Clearance: 59.2 mL/min (by C-G formula based on SCr of 1.04 mg/dL). Liver Function Tests: Recent Labs  Lab 02/14/23 1345  AST 20  ALT 13  ALKPHOS 127*  BILITOT 0.6  PROT 6.6  ALBUMIN 2.5*   No results for input(s): "LIPASE", "AMYLASE" in the last 168 hours. No results for input(s): "AMMONIA" in the last 168 hours. Coagulation Profile: Recent Labs  Lab 02/14/23 1345  INR 1.1   Cardiac Enzymes: No results for input(s): "CKTOTAL", "CKMB", "CKMBINDEX", "TROPONINI" in the last 168 hours. BNP (last 3 results) No results for input(s): "PROBNP" in the last 8760 hours. HbA1C: No results for input(s): "HGBA1C" in the last 72 hours. CBG: No results for input(s): "GLUCAP" in the last 168 hours. Lipid Profile: No results for input(s): "CHOL", "HDL", "LDLCALC", "TRIG", "CHOLHDL", "LDLDIRECT" in the last 72 hours. Thyroid Function Tests: Recent Labs    02/14/23 1533  TSH 2.255   Anemia Panel: Recent Labs    02/14/23 1533  FERRITIN 46  TIBC 155*  IRON 15*   Sepsis Labs: Recent Labs  Lab 02/14/23 1345  02/14/23 1533  LATICACIDVEN 1.0 0.7    Recent Results (from the past 240 hour(s))  Resp panel by RT-PCR (RSV, Flu A&B, Covid) Anterior Nasal Swab     Status: None   Collection Time: 02/14/23  1:45 PM   Specimen: Anterior Nasal Swab  Result Value Ref Range Status   SARS Coronavirus 2 by RT PCR NEGATIVE NEGATIVE Final    Comment: (NOTE) SARS-CoV-2 target nucleic acids are NOT DETECTED.  The SARS-CoV-2 RNA is generally detectable in upper respiratory specimens during the acute phase of infection. The lowest concentration of SARS-CoV-2 viral copies this assay can detect is 138 copies/mL. A negative result does not preclude SARS-Cov-2 infection and should not be used  as the sole basis for treatment or other patient management decisions. A negative result may occur with  improper specimen collection/handling, submission of specimen other than nasopharyngeal swab, presence of viral mutation(s) within the areas targeted by this assay, and inadequate number of viral copies(<138 copies/mL). A negative result must be combined with clinical observations, patient history, and epidemiological information. The expected result is Negative.  Fact Sheet for Patients:  BloggerCourse.com  Fact Sheet for Healthcare Providers:  SeriousBroker.it  This test is no t yet approved or cleared by the [place] FDA and  has been authorized for detection and/or diagnosis of SARS-CoV-2 by FDA under an Emergency Use Authorization (EUA). This EUA will remain  in effect (meaning this test can be used) for the duration of the COVID-19 declaration under Section 564(b)(1) of the Act, 21 U.S.C.section 360bbb-3(b)(1), unless the authorization is terminated  or revoked sooner.       Influenza A by PCR NEGATIVE NEGATIVE Final   Influenza B by PCR NEGATIVE NEGATIVE Final    Comment: (NOTE) The Xpert Xpress SARS-CoV-2/FLU/RSV plus assay is intended as an aid in  the diagnosis of influenza from Nasopharyngeal swab specimens and should not be used as a sole basis for treatment. Nasal washings and aspirates are unacceptable for Xpert Xpress SARS-CoV-2/FLU/RSV testing.  Fact Sheet for Patients: BloggerCourse.com  Fact Sheet for Healthcare Providers: SeriousBroker.it  This test is not yet approved or cleared by the [place] FDA and has been authorized for detection and/or diagnosis of SARS-CoV-2 by FDA under an Emergency Use Authorization (EUA). This EUA will remain in effect (meaning this test can be used) for the duration of the COVID-19 declaration under Section 564(b)(1) of the Act, 21 U.S.C. section 360bbb-3(b)(1), unless the authorization is terminated or revoked.     Resp Syncytial Virus by PCR NEGATIVE NEGATIVE Final    Comment: (NOTE) Fact Sheet for Patients: BloggerCourse.com  Fact Sheet for Healthcare Providers: SeriousBroker.it  This test is not yet approved or cleared by the [place] FDA and has been authorized for detection and/or diagnosis of SARS-CoV-2 by FDA under an Emergency Use Authorization (EUA). This EUA will remain in effect (meaning this test can be used) for the duration of the COVID-19 declaration under Section 564(b)(1) of the Act, 21 U.S.C. section 360bbb-3(b)(1), unless the authorization is terminated or revoked.  Performed at [facility], [address] [place], [place] [address]   Blood Culture (routine x 2)     Status: None (Preliminary result)   Collection Time: 02/14/23  1:45 PM   Specimen: BLOOD  Result Value Ref Range Status   Specimen Description BLOOD LEFT ANTECUBITAL  Final   Special Requests   Final    BOTTLES DRAWN AEROBIC AND ANAEROBIC BACTEROIDES CACCAE   Culture   Final    NO GROWTH < 24 HOURS Performed at [facility], [address], [place], [place]  [address]    Report Status PENDING  Incomplete  Blood Culture (routine x 2)     Status: None (Preliminary result)   Collection Time: 02/14/23  1:46 PM   Specimen: BLOOD  Result Value Ref Range Status   Specimen Description BLOOD RIGHT ANTECUBITAL  Final   Special Requests NONE  Final   Culture   Final    NO GROWTH < 24 HOURS Performed at [facility], [address], [place], [place] [address]    Report Status PENDING  Incomplete      Radiology Studies: No results found.  Scheduled Meds:  aspirin EC  81 mg Oral Daily   enoxaparin (LOVENOX) injection  50 mg Subcutaneous Q24H   furosemide  40 mg Oral Daily   levothyroxine  25 mcg Oral Q0600   mometasone-formoterol  2 puff Inhalation BID   sodium chloride flush  3 mL Intravenous Q12H   Continuous Infusions:  cefTRIAXone (ROCEPHIN)  IV 2 g (02/15/23 1327)   vancomycin       LOS: 0 days   Time spent: 45 minutes   Noralee Stain, DO [facility] 02/15/2023, 1:35 PM   Available via Epic secure chat 7am-7pm After these hours, please refer to coverage provider listed on [email or web address]

## 2023-02-15 NOTE — Evaluation (Signed)
Physical Therapy Evaluation Patient Details Name: [name] [name] MRN: [id number] DOB: [birth year] Today's Date: 02/15/2023  History of Present Illness  [name] [name] is a 89 y.o. male with medical history significant of chronic hypoxic respiratory failure on 5 L secondary to COPD, AV heart block, OSA on CPAP, severe aortic stenosis s/p TAVR, PUD, prediabetes, HFpEF, who presents to the ED due to generalized weakness and a wound.  Clinical Impression  The pt is presenting this session with his daughter at bedside. His daughter states that he requires assistance for all mobility at baseline. The pt was minimally transferring out of bed prior to admission. He was using a slide board for out of bed transfers, however the daughter reports that they were having difficulty with maneuver over the wheel of the standard w/c. The family had a hoyer lift, however felt unaware of how to use it safely. Daughter reports that with education from HHPT that the family would be open to trial the use of a hoyer lift again. The pt presents near baseline, Max A to dependence for bed mobility and positioning. If his primary caregiver is comfortable with him returning home at this level of care then he could safely make that transition.       If plan is discharge home, recommend the following: Two people to help with walking and/or transfers;A lot of help with bathing/dressing/bathroom;Assistance with cooking/housework;Help with stairs or ramp for entrance;Direct supervision/assist for financial management;Assist for transportation;Supervision due to cognitive status   Can travel by private vehicle        Equipment Recommendations [place] lift;Wheelchair (measurements PT) (Chief of Staff wheelchair)  Recommendations for Other Services       Functional Status Assessment Patient has had a recent decline in their functional status and/or demonstrates limited ability to make significant improvements in function  in a reasonable and predictable amount of time     Precautions / Restrictions Precautions Precautions: Fall Restrictions Weight Bearing Restrictions: No      Mobility  Bed Mobility Overal bed mobility: Needs Assistance             General bed mobility comments: Following mobility assessment it was determined that the patient would require Max-Dependent assist for bed mobility. Pt with limited assistance when repositioning in bed.    Transfers                        Ambulation/Gait                  Stairs            Wheelchair Mobility     Tilt Bed    Modified Rankin (Stroke Patients Only)       Balance Overall balance assessment: Needs assistance   Sitting balance-Leahy Scale: Poor Sitting balance - Comments: Pt positioned in chair position in bed, unable to maintain upright position with back support. Postural control: Left lateral lean                                   Pertinent Vitals/Pain Pain Assessment Pain Assessment: Faces Faces Pain Scale: Hurts little more Pain Location: intermittent grimacing with lower extremity ROM Pain Intervention(s): Monitored during session, Limited activity within patient's tolerance    Home Living Family/patient expects to be discharged to:: Private residence Living Arrangements: Spouse/significant other Available Help at Discharge: Family Type of Home: House Home Access:  Ramped entrance       Home Layout: One level Home Equipment: Agricultural consultant (2 wheels);Cane - single point;BSC/3in1;Rollator (4 wheels);Transport chair;Standard Environmental consultant;Wheelchair - manual;Hospital bed      Prior Function Prior Level of Function : Needs assist  Cognitive Assist : Mobility (cognitive);ADLs (cognitive) Mobility (Cognitive): Step by step cues ADLs (Cognitive): Intermittent cues Physical Assist : Mobility (physical);ADLs (physical) Mobility (physical): Bed mobility;Transfers (Pt reporting that  he doesn't stand at baseline) ADLs (physical): Bathing;Dressing;IADLs;Toileting;Grooming   ADLs Comments: sponges bathes at baseline but recently unable to complete due to weakness     Extremity/Trunk Assessment   Upper Extremity Assessment Upper Extremity Assessment: Generalized weakness    Lower Extremity Assessment Lower Extremity Assessment: RLE deficits/detail;LLE deficits/detail RLE Deficits / Details: inability to achieve full knee extension, AAROM for limited hip and knee ROM LLE Deficits / Details: inability to achieve full knee extension; AAROM for limited hip and knee ROM       Communication   Communication Communication: Difficulty communicating thoughts/reduced clarity of speech Cueing Techniques: Verbal cues;Visual cues  Cognition Arousal: Alert Behavior During Therapy: WFL for tasks assessed/performed Overall Cognitive Status: Within Functional Limits for tasks assessed                                          General Comments      Exercises     Assessment/Plan    PT Assessment Patient needs continued PT services  PT Problem List Decreased strength;Decreased range of motion;Decreased balance;Decreased mobility;Decreased skin integrity       PT Treatment Interventions Balance training;DME instruction;Functional mobility training;Patient/family education;Wheelchair mobility training;Therapeutic exercise    PT Goals (Current goals can be found in the Care Plan section)  Acute Rehab PT Goals Patient Stated Goal: To return home PT Goal Formulation: With patient Time For Goal Achievement: 03/01/23 Potential to Achieve Goals: Fair    Frequency Min 1X/week     Co-evaluation               AM-PAC PT "6 Clicks" Mobility  Outcome Measure Help needed turning from your back to your side while in a flat bed without using bedrails?: Total Help needed moving from lying on your back to sitting on the side of a flat bed without using  bedrails?: Total Help needed moving to and from a bed to a chair (including a wheelchair)?: Total Help needed standing up from a chair using your arms (e.g., wheelchair or bedside chair)?: Total Help needed to walk in hospital room?: Total Help needed climbing 3-5 steps with a railing? : Total 6 Click Score: 6    End of Session Equipment Utilized During Treatment: Oxygen Activity Tolerance: Patient tolerated treatment well Patient left: in bed;with family/visitor present Nurse Communication: Mobility status;Need for lift equipment PT Visit Diagnosis: Muscle weakness (generalized) (M62.81);Difficulty in walking, not elsewhere classified (R26.2)    Time: 1610-9604 PT Time Calculation (min) (ACUTE ONLY): 34 min   Charges:   PT Evaluation $PT Eval Low Complexity: 1 Low PT Treatments $Therapeutic Activity: 8-22 mins PT General Charges $$ ACUTE PT VISIT: 1 Visit         9:30 AM, 02/15/23 Cherry [place] A. Mordecai Maes PT, DPT Physical Therapist - [facility]   Alvie Speltz A Ricard Faulkner 02/15/2023, 9:25 AM

## 2023-02-16 ENCOUNTER — Inpatient Hospital Stay: Payer: PPO

## 2023-02-16 DIAGNOSIS — I441 Atrioventricular block, second degree: Secondary | ICD-10-CM | POA: Diagnosis not present

## 2023-02-16 DIAGNOSIS — I5032 Chronic diastolic (congestive) heart failure: Secondary | ICD-10-CM

## 2023-02-16 DIAGNOSIS — M869 Osteomyelitis, unspecified: Secondary | ICD-10-CM | POA: Diagnosis not present

## 2023-02-16 DIAGNOSIS — Z952 Presence of prosthetic heart valve: Secondary | ICD-10-CM | POA: Diagnosis not present

## 2023-02-16 DIAGNOSIS — J438 Other emphysema: Secondary | ICD-10-CM

## 2023-02-16 LAB — BLOOD GAS, ARTERIAL
Acid-Base Excess: 15 mmol/L — ABNORMAL HIGH (ref 0.0–2.0)
Bicarbonate: 42.8 mmol/L — ABNORMAL HIGH (ref 20.0–28.0)
Expiratory PAP: 5 cmH2O
Inspiratory PAP: 12 cmH2O
O2 Content: 4 L/min
O2 Saturation: 83.4 %
Patient temperature: 37
pCO2 arterial: 66 mmHg (ref 32–48)
pH, Arterial: 7.42 (ref 7.35–7.45)
pO2, Arterial: 48 mmHg — ABNORMAL LOW (ref 83–108)

## 2023-02-16 LAB — PREPARE RBC (CROSSMATCH)

## 2023-02-16 LAB — CBC
HCT: 27.4 % — ABNORMAL LOW (ref 39.0–52.0)
Hemoglobin: 7.8 g/dL — ABNORMAL LOW (ref 13.0–17.0)
MCH: 30.1 pg (ref 26.0–34.0)
MCHC: 28.5 g/dL — ABNORMAL LOW (ref 30.0–36.0)
MCV: 105.8 fL — ABNORMAL HIGH (ref 80.0–100.0)
Platelets: 280 10*3/uL (ref 150–400)
RBC: 2.59 MIL/uL — ABNORMAL LOW (ref 4.22–5.81)
RDW: 14.4 % (ref 11.5–15.5)
WBC: 9.9 10*3/uL (ref 4.0–10.5)
nRBC: 0 % (ref 0.0–0.2)

## 2023-02-16 LAB — GLUCOSE, CAPILLARY: Glucose-Capillary: 89 mg/dL (ref 70–99)

## 2023-02-16 LAB — C-REACTIVE PROTEIN: CRP: 10 mg/dL — ABNORMAL HIGH (ref <1.0)

## 2023-02-16 LAB — SEDIMENTATION RATE: Sed Rate: 107 mm/h — ABNORMAL HIGH (ref 0–20)

## 2023-02-16 MED ORDER — LORAZEPAM 0.5 MG PO TABS: 0.5000 mg | ORAL_TABLET | Freq: Three times a day (TID) | ORAL | Status: DC | PRN

## 2023-02-16 MED ORDER — SODIUM CHLORIDE 0.9 % IV SOLN
200.0000 mg | Freq: Every day | INTRAVENOUS | Status: AC
  Administered 2023-02-16 – 2023-02-18 (×3): 200 mg via INTRAVENOUS
  Filled 2023-02-16 (×3): qty 200

## 2023-02-16 MED ORDER — FUROSEMIDE 10 MG/ML IJ SOLN
40.0000 mg | Freq: Every day | INTRAMUSCULAR | Status: DC
  Administered 2023-02-16 – 2023-02-18 (×3): 40 mg via INTRAVENOUS
  Filled 2023-02-16 (×3): qty 4

## 2023-02-16 MED ORDER — SODIUM CHLORIDE 0.9% IV SOLUTION: Freq: Once | INTRAVENOUS | Status: AC

## 2023-02-16 MED ORDER — MUPIROCIN CALCIUM 2 % EX CREA
TOPICAL_CREAM | Freq: Two times a day (BID) | CUTANEOUS | Status: DC
  Filled 2023-02-16: qty 15

## 2023-02-16 NOTE — Progress Notes (Signed)
[facility]  - [place] [facility]   PATIENT NAME: [name] [name]    MR#:  [id number]  DATE OF BIRTH:  Jul 22, 1933  SUBJECTIVE:  patient's wife and daughter at bedside. Apparently patient is bedbound and uses hospital bed. Able to transfer to wheelchair but with very great difficulty. Mostly remains in bed. Patient managing right great toe wound with dressing changes at home. It has been chronic. Patient today has been very drowsy. Placed back on BiPAP. ABG shows improving CO2 levels. Did eat some earlier.   VITALS:  Blood pressure (!) 106/53, pulse 96, temperature 98.5 F (36.9 C), resp. rate 16, height 5\' 10"  (1.778 m), weight 96.1 kg, SpO2 95%.  PHYSICAL EXAMINATION:  limited GENERAL:  89 y.o.-year-old patient with  acute distress. Morbidly obese LUNGS: decreased breath sounds bilaterally, no wheezing CARDIOVASCULAR: S1, S2 normal. No murmur   ABDOMEN: Soft, nontender, nondistended. Bowel sounds present.  EXTREMITIES: No  edema b/l.    NEUROLOGIC: nonfocal  patient is lethargic. Awakens answers couple questions. SKIN:  Pressure Injury 05/31/22 Sacrum Stage 2 -  Partial thickness loss of dermis presenting as a shallow open injury with a red, pink wound bed without slough. (Active)  05/31/22 2000  Location: Sacrum  Location Orientation:   Staging: Stage 2 -  Partial thickness loss of dermis presenting as a shallow open injury with a red, pink wound bed without slough.  Wound Description (Comments):   Present on Admission: Yes     Pressure Injury 06/12/22 Buttocks Left Stage 2 -  Partial thickness loss of dermis presenting as a shallow open injury with a red, pink wound bed without slough. (Active)  06/12/22 1824  Location: Buttocks  Location Orientation: Left  Staging: Stage 2 -  Partial thickness loss of dermis presenting as a shallow open injury with a red, pink wound bed without slough.  Wound Description (Comments):   Present on Admission: Yes     Pressure  Injury 02/15/23 Thigh Anterior;Right Unstageable - Full thickness tissue loss in which the base of the injury is covered by slough (yellow, tan, gray, green or brown) and/or eschar (tan, brown or black) in the wound bed. WOC 9/2; this is not pre (Active)  02/15/23 1835  Location: Thigh  Location Orientation: Anterior;Right  Staging: Unstageable - Full thickness tissue loss in which the base of the injury is covered by slough (yellow, tan, gray, green or brown) and/or eschar (tan, brown or black) in the wound bed.  Wound Description (Comments): WOC 9/2; this is not pressure injury; infectious in nature  Present on Admission: Yes      LABORATORY PANEL:  CBC Recent Labs  Lab 02/16/23 0447  WBC 9.9  HGB 7.8*  HCT 27.4*  PLT 280    Chemistries  Recent Labs  Lab 02/14/23 1345 02/15/23 0640  NA 140 140  K 4.3 4.2  CL 91* 95*  CO2 38* 37*  GLUCOSE 91 76  BUN 32* 28*  CREATININE 1.14 1.04  CALCIUM 8.2* 8.1*  AST 20  --   ALT 13  --   ALKPHOS 127*  --   BILITOT 0.6  --    Cardiac Enzymes No results for input(s): "TROPONINI" in the last 168 hours. RADIOLOGY:  DG Chest Port 1 View  Result Date: 02/16/2023 CLINICAL DATA:  Shortness of breath. EXAM: PORTABLE CHEST 1 VIEW COMPARISON:  Radiograph and CT 06/10/2022 FINDINGS: Mild cardiomegaly. Unchanged mediastinal contours. TAVR. Retrocardiac opacity likely combination of airspace disease and small pleural effusion.  Right upper lobe mass is faintly visualized by radiograph. No pneumothorax. IMPRESSION: 1. Retrocardiac opacity likely combination of airspace disease and small pleural effusion. 2. Mild cardiomegaly. 3. Right upper lobe mass is faintly visualized by radiograph. Electronically Signed   By: Narda Rutherford M.D.   On: 02/16/2023 15:11   MR FOOT LEFT W WO CONTRAST  Result Date: 02/16/2023 CLINICAL DATA:  Great toe infection. EXAM: MRI OF THE LEFT FOREFOOT WITHOUT AND WITH CONTRAST TECHNIQUE: Multiplanar, multisequence MR  imaging of the left forefoot was performed both before and after administration of intravenous contrast. CONTRAST:  10mL GADAVIST GADOBUTROL 1 MMOL/ML IV SOLN COMPARISON:  None Available. FINDINGS: Bones/Joint/Cartilage Suggested skin ulceration over the dorsal aspect of the interphalangeal joint of the great toe. There is underlying marrow T2 hyperintensity and low level enhancement within the head of the 1st proximal phalanx. Questionable mildly decreased T1 marrow signal without gross cortical destruction. The distal 1st phalanx in the 1st metatarsal demonstrate no significant findings. No significant joint effusion or abnormal synovial enhancement. The additional toes and metatarsals appear unremarkable. No significant abnormality of the Lisfranc joint or within the visualized tarsal bones. Ligaments Intact Lisfranc joint. Intact collateral ligaments of the metatarsophalangeal joints. Muscles and Tendons Generalized forefoot muscular atrophy without focal fluid collection or abnormal enhancement. The forefoot tendons appear intact, without significant tenosynovitis. Soft tissues As above, suspected soft tissue ulceration dorsal to the interphalangeal joint of the great toe with associated soft tissue enhancement in this area. No focal fluid collection or foreign body identified. No other focal soft tissue abnormalities or abnormal enhancement identified. IMPRESSION: 1. Suspected soft tissue ulceration over the dorsal aspect of the interphalangeal joint of the great toe with underlying marrow changes in the head of the 1st proximal phalanx suspicious for early osteomyelitis. Correlate clinically. 2. No evidence of septic arthritis or soft tissue abscess. 3. Generalized forefoot muscular atrophy. Electronically Signed   By: Carey Bullocks M.D.   On: 02/16/2023 08:12   ECHOCARDIOGRAM COMPLETE  Result Date: 02/15/2023    ECHOCARDIOGRAM REPORT   Patient Name:   KINGDON LENZINI Date of Exam: 02/15/2023 Medical Rec #:   [id number]    Height:       70.0 in Accession #:    [id number]   Weight:       220.0 lb Date of Birth:  [birth year]    BSA:          2.174 m Patient Age:    89 years     BP:           151/53 mmHg Patient Gender: M            HR:           49 bpm. Exam Location:  [facility] Procedure: 2D Echo, Cardiac Doppler and Color Doppler Indications:     Heart block, 2nd degree I44.1  History:         Patient has prior history of Echocardiogram examinations.                  Signs/Symptoms:Shortness of Breath.  Sonographer:     Neysa Bonito Roar Referring Phys:  [id number] SHERI HAMMOCK Diagnosing Phys: Chilton Si MD IMPRESSIONS  1. Left ventricular ejection fraction, by estimation, is 60 to 65%. The left ventricle has normal function. The left ventricle has no regional wall motion abnormalities. There is mild left ventricular hypertrophy. Left ventricular diastolic parameters are consistent with Grade I diastolic dysfunction (impaired relaxation). Elevated left ventricular end-diastolic pressure.  2. Right ventricular systolic function is normal. The right ventricular size is normal. There is severely elevated pulmonary artery systolic pressure.  3. Left atrial size was severely dilated.  4. Right atrial size was moderately dilated.  5. The mitral valve is normal in structure. Mild mitral valve regurgitation. Moderate mitral stenosis. The mean mitral valve gradient is 10.0 mmHg. Severe mitral annular calcification.  6. The aortic valve is normal in structure. Aortic valve regurgitation is not visualized. Moderate aortic valve stenosis. Aortic valve area, by VTI measures 1.53 cm. Aortic valve mean gradient measures 25.0 mmHg. Aortic valve Vmax measures 3.66 m/s.  7. The inferior vena cava is dilated in size with <50% respiratory variability, suggesting right atrial pressure of 15 mmHg. FINDINGS  Left Ventricle: Left ventricular ejection fraction, by estimation, is 60 to 65%. The left ventricle has normal function. The left ventricle has  no regional wall motion abnormalities. The left ventricular internal cavity size was normal in size. There is  mild left ventricular hypertrophy. Left ventricular diastolic parameters are consistent with Grade I diastolic dysfunction (impaired relaxation). Elevated left ventricular end-diastolic pressure. Right Ventricle: The right ventricular size is normal. No increase in right ventricular wall thickness. Right ventricular systolic function is normal. There is severely elevated pulmonary artery systolic pressure. The tricuspid regurgitant velocity is 3.74 m/s, and with an assumed right atrial pressure of 15 mmHg, the estimated right ventricular systolic pressure is 71.0 mmHg. Left Atrium: Left atrial size was severely dilated. Right Atrium: Right atrial size was moderately dilated. Pericardium: There is no evidence of pericardial effusion. Mitral Valve: The mitral valve is normal in structure. There is moderate thickening of the mitral valve leaflet(s). There is moderate calcification of the mitral valve leaflet(s). Severe mitral annular calcification. Mild mitral valve regurgitation. Moderate mitral valve stenosis. MV peak gradient, 23.8 mmHg. The mean mitral valve gradient is 10.0 mmHg. Tricuspid Valve: The tricuspid valve is normal in structure. Tricuspid valve regurgitation is mild . No evidence of tricuspid stenosis. Aortic Valve: The aortic valve is normal in structure. Aortic valve regurgitation is not visualized. Moderate aortic stenosis is present. Aortic valve mean gradient measures 25.0 mmHg. Aortic valve peak gradient measures 53.6 mmHg. Aortic valve area, by VTI measures 1.53 cm. Pulmonic Valve: The pulmonic valve was normal in structure. Pulmonic valve regurgitation is trivial. No evidence of pulmonic stenosis. Aorta: The aortic root is normal in size and structure. Venous: The inferior vena cava is dilated in size with less than 50% respiratory variability, suggesting right atrial pressure of 15  mmHg. IAS/Shunts: No atrial level shunt detected by color flow Doppler.  LEFT VENTRICLE PLAX 2D LVIDd:         5.80 cm   Diastology LVIDs:         4.20 cm   LV e' medial:    7.72 cm/s LV PW:         1.00 cm   LV E/e' medial:  20.7 LV IVS:        1.10 cm   LV e' lateral:   5.98 cm/s LVOT diam:     1.80 cm   LV E/e' lateral: 26.8 LV SV:         110 LV SV Index:   50 LVOT Area:     2.54 cm  RIGHT VENTRICLE RV Basal diam:  3.80 cm RV Mid diam:    3.50 cm RV S prime:     21.30 cm/s TAPSE (M-mode): 3.3 cm LEFT ATRIUM  Index        RIGHT ATRIUM           Index LA diam:        4.50 cm  2.07 cm/m   RA Area:     23.50 cm LA Vol (A2C):   118.0 ml 54.29 ml/m  RA Volume:   74.10 ml  34.09 ml/m LA Vol (A4C):   91.0 ml  41.86 ml/m LA Biplane Vol: 109.0 ml 50.15 ml/m  AORTIC VALVE                     PULMONIC VALVE AV Area (Vmax):    1.31 cm      PV Vmax:          1.67 m/s AV Area (Vmean):   1.56 cm      PV Peak grad:     11.2 mmHg AV Area (VTI):     1.53 cm      PR End Diast Vel: 6.97 msec AV Vmax:           366.00 cm/s   RVOT Peak grad:   6 mmHg AV Vmean:          203.667 cm/s AV VTI:            0.716 m AV Peak Grad:      53.6 mmHg AV Mean Grad:      25.0 mmHg LVOT Vmax:         189.00 cm/s LVOT Vmean:        125.000 cm/s LVOT VTI:          0.431 m LVOT/AV VTI ratio: 0.60  AORTA Ao Root diam: 2.30 cm MITRAL VALVE                TRICUSPID VALVE MV Area (PHT): 6.54 cm     TR Peak grad:   56.0 mmHg MV Area VTI:   2.60 cm     TR Vmax:        374.00 cm/s MV Peak grad:  23.8 mmHg MV Mean grad:  10.0 mmHg    SHUNTS MV Vmax:       2.44 m/s     Systemic VTI:  0.43 m MV Vmean:      149.0 cm/s   Systemic Diam: 1.80 cm MV Decel Time: 116 msec MV E velocity: 160.00 cm/s MV A velocity: 189.00 cm/s MV E/A ratio:  0.85 MV A Prime:    15.4 cm/s Chilton Si MD Electronically signed by Chilton Si MD Signature Date/Time: 02/15/2023/2:55:52 PM    Final     Assessment and Plan Humberto Leep Goodall is a 89 y.o. male with  medical history significant of chronic hypoxic respiratory failure on 5 L secondary to COPD, AV heart block, OSA on CPAP, severe aortic stenosis s/p TAVR, PUD, prediabetes, HFpEF, who presents to the ED due to generalized weakness and a wound.   Additionally, had a small wound in the lateral aspect of the right leg, with increasing erythema.  Also with left great toe wound.  In the emergency department, patient was found to have 2:1 AV block.  Cardiology was consulte  Second-degree AV block Acute on Chronic CHF ,diastolic with severe PHT - [facility]iology consulted--Dr Ends input appreciated -Avoid AV nodal blocking agents, currently no indication for pacemaker placement --IV lasix  Left great toe wound Cellulitis of right lateral thigh -Continue vancomycin, ceftriaxone for now--wound looks dry -Wound RN  -MRI to rule out osteo --  Suspected soft tissue ulceration over the dorsal aspect of the interphalangeal joint of the great toe with underlying marrow changes in the head of the 1st proximal phalanx suspicious for early osteomyelitis. Correlate clinically. 2. No evidence of septic arthritis or soft tissue abscess. 3. Generalized forefoot muscular atrophy. --Podaitry consult with Dr Annamary Rummage. Await recs.   Symptomatic anemia -Baseline hemoglobin between 9 and 10 no reported history of bleeding -Iron panel consistent with anemia of chronic disease /IDA -- patient's hemoglobin down to 7.8. Given significant cardiac history discussed with cardiology and will transfused one unit blood transfusion. Consent from daughter and wife obtained. Will also give IV iron given significant low serum iron.   Lung nodule -Has been enlarging since previous imaging in 2016.  Had previously declined repeat imaging or pursuing any sort of treatment   COPD with chronic hypoxic, hypercapnic respiratory failure  - ABG to see CO2 retention --> CO2 101-->66 today  -- cont BiPAP for hypercapnic resp failure   --avoid sedating meds   Hypothyroidism -Synthroid    Status post TAVR -Aspirin   Morbid obesity with bedbound status. Chronic   palliative care consultation. Patient has multiple comorbidities. Overall appears sick. Family agreeable with plan.  DVT prophylaxis: Lovenox  Procedures: Family communication : daughter and wife at bedside Consults : [facility] cardiology, podiatry CODE STATUS: DNR Level of care: Progressive Status is: Inpatient Remains inpatient appropriate because: hypercapnia respiratory failure, getting blood transfusion, getting IV Lasix and IV venofer     TOTAL TIME TAKING CARE OF THIS PATIENT: 45 minutes.  >50% time spent on counselling and coordination of care  Note: This dictation was prepared with Dragon dictation along with smaller phrase technology. Any transcriptional errors that result from this process are unintentional.  Enedina Finner M.D    [facility]   CC: Primary care physician; Joaquim Nam, MD

## 2023-02-16 NOTE — Plan of Care (Signed)

## 2023-02-16 NOTE — TOC Initial Note (Addendum)
Transition of Care [facility]) - Initial/Assessment Note    Patient Details  Name: [name] [name] MRN: [id number] Date of Birth: 01-27-1934  [facility] CM/SW Contact:    Truddie Hidden, RN Phone Number: 02/16/2023, 2:11 PM  Clinical Narrative:                 Spoke with patient's wife regarding therapy recommendation for [facility]. Patient was previously active with [facility]. RNCM sent Coralee North from [facility] a notification for PT and RN. Patient wife advised to follow up with patient's PCP for an order for  an electric WC. Patient hasa manual WC at home. Spouse was advised hoyer request would be sent to [facility] and they would follow up with her.   Referral for [facility] lift sent to Cletis Athens at [facility].         Patient Goals and CMS Choice            Expected Discharge Plan and Services                                              Prior Living Arrangements/Services                       Activities of Daily Living Home Assistive Devices/Equipment: Wheelchair, CPAP, Oxygen ADL Screening (condition at time of admission) Patient's cognitive ability adequate to safely complete daily activities?: No Is the patient deaf or have difficulty hearing?: No Does the patient have difficulty seeing, even when wearing glasses/contacts?: Yes Does the patient have difficulty concentrating, remembering, or making decisions?: Yes Patient able to express need for assistance with ADLs?: Yes Does the patient have difficulty dressing or bathing?: Yes Independently performs ADLs?: Yes (appropriate for developmental age) Does the patient have difficulty walking or climbing stairs?: Yes Weakness of Legs: Both Weakness of Arms/Hands: None  Permission Sought/Granted                  Emotional Assessment              Admission diagnosis:  2nd degree AV block [I44.1] Cellulitis of right lower extremity [L03.115] Second degree Mobitz II AV block [I44.1] Patient Active Problem List    Diagnosis Date Noted   Generalized weakness 02/14/2023   Cellulitis 02/14/2023   Second degree Mobitz II AV block 02/14/2023   Lung nodule 02/14/2023   Neuropathy 09/05/2022   Gait abnormality 08/17/2022   Foot pain 08/17/2022   Severe sepsis with acute organ dysfunction (HCC) 06/10/2022   Respiratory failure with hypercapnia (HCC) 05/30/2022   Acute metabolic encephalopathy 05/30/2022   Normocytic anemia 02/24/2022   Scalp lesion 02/12/2022   Constipation 02/12/2022   Skin inflammation 12/29/2021   Rash 06/17/2021   Pressure sore 06/17/2021   Olecranon bursitis 06/17/2021   Valvular heart disease 04/19/2021   Venous stasis ulcer (HCC) 03/13/2021   Heart block 01/17/2021   Blurry vision 11/07/2019   Healthcare maintenance 11/07/2019   Insomnia 04/01/2018   Myalgia 12/03/2016   Vitamin D deficiency 11/21/2016   Chronic heart failure with preserved ejection fraction (HFpEF) (HCC) 07/13/2016   AAA (abdominal aortic aneurysm) without rupture (HCC) 07/13/2016   COPD with acute exacerbation (HCC) 12/27/2015   Fecal urgency 09/14/2015   S/P TAVR (transcatheter aortic valve replacement) 06/26/2015   Severe aortic stenosis 05/15/2015   Medicare annual wellness visit, subsequent 05/21/2014  Advance care planning 05/21/2014   Skin irritation 12/20/2013   Risk for falls 12/20/2013   Rib pain on right side 01/29/2013   Vertigo 07/01/2012   Right knee DJD 10/31/2011   Knee pain 10/23/2011   OSA (obstructive sleep apnea) 07/31/2009   Chronic respiratory failure with hypoxia (HCC) 08/03/2008   Morbid obesity (HCC) 02/22/2008   POLYP, COLON 08/27/2007   Anxiety state 08/27/2007   COPD (chronic obstructive pulmonary disease) with emphysema (HCC) 08/27/2007   INTERNAL HEMORRHOIDS 03/23/2007   DIVERTICULOSIS, COLON 03/23/2007   SYMPTOM, PAIN, ABDOMINAL, GENERALIZED 02/18/2007   HYPERGLYCEMIA 02/18/2007   Essential hypertension 02/10/2007   NEOPLASM, SKIN, UNCERTAIN BEHAVIOR 12/04/2006    PEPTIC ULCER DISEASE WITH H-PYLORI  TX'D 11/19/2006   NEPHROLITHIASIS 11/19/2006   Backache 11/19/2006   BPH (benign prostatic hyperplasia) 11/19/2006   PCP:  Joaquim Nam, MD Pharmacy:   [facility] [phone] [place], [place] - [address] [place] [address] Phone: [phone] Fax: [phone]     Social Determinants of Health (SDOH) Social History: SDOH Screenings   Food Insecurity: No Food Insecurity (02/15/2023)  Housing: Low Risk  (02/15/2023)  Transportation Needs: No Transportation Needs (02/15/2023)  Recent Concern: Transportation Needs - Unmet Transportation Needs (12/30/2022)  Utilities: Not At Risk (02/15/2023)  Alcohol Screen: Low Risk  (12/16/2021)  Depression (PHQ2-9): Low Risk  (12/30/2022)  Financial Resource Strain: Low Risk  (12/30/2022)  Physical Activity: Inactive (12/30/2022)  Social Connections: Moderately Integrated (12/30/2022)  Stress: No Stress Concern Present (12/30/2022)  Tobacco Use: Medium Risk (02/14/2023)  Health Literacy: Adequate Health Literacy (12/30/2022)   SDOH Interventions:     Readmission Risk Interventions     No data to display

## 2023-02-16 NOTE — Consult Note (Signed)
PODIATRY CONSULTATION  NAME [name] [name] MRN [id number] DOB Dec 28, 1933 DOA 02/14/2023   Reason for consult:  Chief Complaint  Patient presents with   Weakness   Wound Infection    Attending/Consulting physician: Dr. Allena Katz, MD  History of present illness: 89 y.o. male admitted for PMHx significant for  chronic hypoxic respiratory failure on 5 L secondary to COPD, AV heart block, OSA on CPAP, severe aortic stenosis s/p TAVR, PUD, prediabetes, HFpEF admitted for weakness. Having cardiac and respiratory issues. Also has chronic toe ulcer of left hallux. MRI obtained and concern for osteomyelitis. Pts wife states the ulcer has been there for months and not sure why. Has been having a home health care RN come out to dress the ulcer. Would prefer to avoid surgery / amputation if at all possible.   Past Medical History:  Diagnosis Date   AAA (abdominal aortic aneurysm) (HCC)    a. 3.8 cm CTA 05/2015 b. 3.9 cm by [place] in 11/2017   Anxiety    COPD (chronic obstructive pulmonary disease) (HCC)    on home O2 4LPM   Diverticulosis    Former smoker    quit 2001   Hemorrhoids    Kidney stones    passed spontaneously   OSA on CPAP    last study- in the home possibility- 2015, pt. unsure    Prediabetes    Hgb A1C 5.8 in Jan 2017   PUD (peptic ulcer disease)    Right knee DJD 10/31/2011   Severe   S/P TAVR (transcatheter aortic valve replacement) 06/26/2015   26 mm Edwards Sapien 3 transcatheter heart valve placed via percutaneous right transfemoral approach   Severe aortic stenosis    a. Echo 11/16:  Mod LVH, EF 55-60%, no RWMA, Gr 1 DD, severe AS (mean 51 mmHg; peak 86 mmHg), MAC, mild LAE  b. s/p TAVR on 06/26/2015 with 26mm Edward Sapien 3 THV   Shortness of breath dyspnea        Latest Ref Rng & Units 02/16/2023    4:47 AM 02/15/2023    6:40 AM 02/14/2023    1:45 PM  CBC  WBC 4.0 - 10.5 K/uL 9.9  9.3  9.6   Hemoglobin 13.0 - 17.0 g/dL 7.8  7.3  7.6   Hematocrit 39.0 - 52.0 % 27.4  25.8   27.3   Platelets 150 - 400 K/uL 280  255  268        Latest Ref Rng & Units 02/15/2023    6:40 AM 02/14/2023    1:45 PM 09/04/2022    3:18 PM  BMP  Glucose 70 - 99 mg/dL 76  91  409   BUN 8 - 23 mg/dL 28  32  25   Creatinine 0.61 - 1.24 mg/dL 8.11  9.14  7.82   Sodium 135 - 145 mmol/L 140  140  143   Potassium 3.5 - 5.1 mmol/L 4.2  4.3  4.3   Chloride 98 - 111 mmol/L 95  91  91   CO2 22 - 32 mmol/L 37  38  45   Calcium 8.9 - 10.3 mg/dL 8.1  8.2  9.3       Physical Exam: Lower Extremity Exam Vasc: R - PT palpable, DP 1/4 palpable. Cap refill < 3 sec to digits  L - PT palpable, DP  NON palpable. Cap refill slow  Derm: R - Normal temp/texture/turgor with no open lesion or clinical signs of infection  L - Ulceration  dorsal left hallux IPJ, probes directed to proximal phalanx head. Fibrotic and necrotic eschar overlying. Mild erythema surrounding.   MSK:  R - Mallet toe of halux  L - Mallet toe of hallux  Neuro: R - Gross sensation absent. Gross motor function diminished   L - Gross sensation absent. Gross motor function dimisihed    ASSESSMENT/PLAN OF CARE 89 y.o. male with PMHx significant for  chronic hypoxic respiratory failure on 5 L secondary to COPD, AV heart block, OSA on CPAP, severe aortic stenosis s/p TAVR, PUD, prediabetes, HFpEF  admitted for weakness with chronic ulceration to the left hallux with osteomyelitis of the proximal phalanx.   WBC 9.9 ESR 107, CRP p  MRI L foot:  Suspected soft tissue ulceration over the dorsal aspect of the interphalangeal joint of the great toe with underlying marrow changes in the head of the 1st proximal phalanx suspicious for early osteomyelitis.   - Recommend local wound care and PO abx. Pts wife does not want amputation at this time. Discussed benefits and risks of conservative vs surgical (amputation) management of this infection. Patient is high risk for any amputation and any anesthesia esp right now but discussed he will not  heal wound given osteomyelitis though may be able to continue for a long time with local wound care and PO abx as needed for suppression. Debrided wound at bedside today.  - Wound culture swab was taken at this visit and placed on the computer in the room. RN was notified and discussed with her in person.  - Anticoagulation: per primary - Wound care: Recommend 2-3x weekly home dressing change - will need home health RN for this.  - For wound care I recommend mupirocin ointment applied to the wound, cover with silver alginate dressing and wrap with small amount of in guaze roll secure with tape - WB status: WBAT to LLE in post op shoe - ordered - Recommend follow up in [place] [facility] vs [facility] wound care center for ongoing wound care  Thank you for the consult.  Please contact me directly with any questions or concerns.           Corinna Gab, DPM [facility] / [facility]    [address] [place], [place] [address]                Office [phone]  Fax [phone]

## 2023-02-16 NOTE — Progress Notes (Addendum)
Patient Name: [name] [name] Date of Encounter: 02/16/2023 [facility] Cardiologist: Yvonne Kendall, MD   Interval Summary  .    "I feel terrible."  He complains of some dyspnea.  No focal pain including chest pain on further questioning.  He notes some lightheadedness and generalized fatigue.  Vital Signs .    Vitals:   02/16/23 0025 02/16/23 0601 02/16/23 0731 02/16/23 0741  BP: (!) 144/45 (!) 151/41 (!) 133/46   Pulse: (!) 46 (!) 48 81   Resp: (!) 22 20 20    Temp: (!) 97.5 F (36.4 C) 98.7 F (37.1 C) 98.7 F (37.1 C)   TempSrc: Oral Oral Axillary   SpO2: 93% 99% 99% 99%  Weight:      Height:        Intake/Output Summary (Last 24 hours) at 02/16/2023 0943 Last data filed at 02/16/2023 0300 Gross per 24 hour  Intake 400 ml  Output --  Net 400 ml      02/15/2023    6:09 PM 02/14/2023    1:06 PM 12/30/2022    2:05 PM  Last 3 Weights  Weight (lbs) 211 lb 13.8 oz 220 lb 0.3 oz 220 lb  Weight (kg) 96.1 kg 99.8 kg 99.791 kg      Telemetry/ECG    Normal sinus rhythm with intermittent PACs (some of which are blocked) as well as Mobitz type II second-degree AV block and 2:1 AV block - Personally Reviewed  Physical Exam .   GEN: No acute distress.   Neck: No JVD Cardiac: Bradycardic but regular rhythm with 3/6 systolic murmur. Respiratory: Diminished breath sounds throughout. GI: Soft, nontender, non-distended  MS: No edema.  Right posterior lateral thigh wound covered with a clean bandage.  Assessment & Plan .     Mobitz type 2 second degree AV block: [name] [name] continues to have episodes of an isolated nonconducted P waves consistent with Mobitz type II second-degree AV block and longer episodes of 2:1 AV block.  He reports feeling terrible again today, though it is difficult to know if this is related to his bradycardia (as he has demonstrated similar AV block in the past), his active infection, or some other process.  Temporary transvenous pacing is not  indicated at this time.  He is also a poor candidate for permanent pacemaker implantation given his active infection, enlarging lung mass suspicious for malignancy that [name] [name] previously declined further evaluation of, and bedbound state.  AV nodal blocking agents should continue to avoided.  Acute on chronic HFpEF and severe pulmonary hypertension: Volume exam is challenging though I suspect [name] [name] is at least mildly volume overloaded.  Echo this admission showed normal LVEF with severely elevated PA pressures.  Urine output is not recorded.  Weight recorded as being down 3.7 kg this admission, though I am not sure how accurate it is.  I think it is reasonable to continue with furosemide 40 mg p.o. daily for now.  Aortic stenosis status post TAVR: Echocardiogram yesterday showed moderately elevated transvalvular gradient (mean gradient 25 mmHg, up from 14 mmHg in 03/2021).  Significant increase in gradient could be due to valve degeneration though endocarditis cannot be entirely excluded.  Of note, blood cultures thus far have been no growth to date.  Different LV loading conditions in the setting of 2:11 AV block may also affect aortic valve gradient, as the dimensionless index is actually higher today and in the mild range (0.6, previously 0.5).  No further intervention  recommended at this time, though if blood cultures become positive, TEE will need to be considered.  Hypertension: Blood pressure is fairly stable with upper normal to mildly elevated systolic and low diastolic readings.  Defer antihypertensive therapy at this time.  Anemia: Chronic anemia appears worse than baseline, with hemoglobin now between 7 and 8.  Given lightheadedness and malaise, I would have a low threshold for PRBC transfusion.  I will defer this and ongoing workup of acute on chronic anemia to the primary team.  Left great toe osteomyelitis and right thigh wound: MRI of the left great toe suspicious for early  osteomyelitis.  Right thigh wound covered with dressing; wound care team following.  Consider consultation with ID.  Lung mass: Enlarging right upper lobe lung mass noted on CT chest in 05/2022.  Per admission H&P, [name] [name] was not interested in pursuing any sort of treatment and was unsure if he wished to pursue repeat imaging to evaluate for interval change.  Given his shortness of breath and diffusely diminished breath sounds, I think it would be reasonable to obtain at least a chest radiograph.  However, I will defer this to the primary team.  For questions or updates, please contact [facility] Please consult [email or web address] for contact info under [facility]iology.  Signed, Yvonne Kendall, MD

## 2023-02-17 DIAGNOSIS — I5033 Acute on chronic diastolic (congestive) heart failure: Secondary | ICD-10-CM | POA: Diagnosis not present

## 2023-02-17 DIAGNOSIS — R531 Weakness: Secondary | ICD-10-CM | POA: Diagnosis not present

## 2023-02-17 DIAGNOSIS — J438 Other emphysema: Secondary | ICD-10-CM | POA: Diagnosis not present

## 2023-02-17 DIAGNOSIS — J9611 Chronic respiratory failure with hypoxia: Secondary | ICD-10-CM | POA: Diagnosis not present

## 2023-02-17 DIAGNOSIS — I441 Atrioventricular block, second degree: Secondary | ICD-10-CM | POA: Diagnosis not present

## 2023-02-17 DIAGNOSIS — Z515 Encounter for palliative care: Secondary | ICD-10-CM

## 2023-02-17 LAB — BASIC METABOLIC PANEL
Anion gap: 10 (ref 5–15)
BUN: 24 mg/dL — ABNORMAL HIGH (ref 8–23)
CO2: 38 mmol/L — ABNORMAL HIGH (ref 22–32)
Calcium: 8.1 mg/dL — ABNORMAL LOW (ref 8.9–10.3)
Chloride: 93 mmol/L — ABNORMAL LOW (ref 98–111)
Creatinine, Ser: 1.04 mg/dL (ref 0.61–1.24)
GFR, Estimated: >60 mL/min (ref >60)
Glucose, Bld: 91 mg/dL (ref 70–99)
Potassium: 3.6 mmol/L (ref 3.5–5.1)
Sodium: 141 mmol/L (ref 135–145)

## 2023-02-17 LAB — TYPE AND SCREEN
ABO/RH(D): A POS
Antibody Screen: NEGATIVE
Unit division: 0

## 2023-02-17 LAB — CBC
HCT: 27.8 % — ABNORMAL LOW (ref 39.0–52.0)
Hemoglobin: 8.2 g/dL — ABNORMAL LOW (ref 13.0–17.0)
MCH: 29.1 pg (ref 26.0–34.0)
MCHC: 29.5 g/dL — ABNORMAL LOW (ref 30.0–36.0)
MCV: 98.6 fL (ref 80.0–100.0)
Platelets: 305 10*3/uL (ref 150–400)
RBC: 2.82 MIL/uL — ABNORMAL LOW (ref 4.22–5.81)
RDW: 17.2 % — ABNORMAL HIGH (ref 11.5–15.5)
WBC: 11.4 10*3/uL — ABNORMAL HIGH (ref 4.0–10.5)
nRBC: 0 % (ref 0.0–0.2)

## 2023-02-17 LAB — BPAM RBC
Blood Product Expiration Date: 202409102359
ISSUE DATE / TIME: 202409021715
Unit Type and Rh: 600

## 2023-02-17 MED ORDER — DOXYCYCLINE HYCLATE 100 MG PO TABS
100.0000 mg | ORAL_TABLET | Freq: Two times a day (BID) | ORAL | Status: DC
  Administered 2023-02-17 – 2023-02-19 (×5): 100 mg via ORAL
  Filled 2023-02-17 (×5): qty 1

## 2023-02-17 MED ORDER — NAPHAZOLINE-GLYCERIN 0.012-0.25 % OP SOLN
1.0000 [drp] | Freq: Four times a day (QID) | OPHTHALMIC | Status: DC | PRN
  Administered 2023-02-17 – 2023-02-18 (×5): 2 [drp] via OPHTHALMIC
  Filled 2023-02-17: qty 15

## 2023-02-17 NOTE — Plan of Care (Signed)
  Problem: Nutrition: Goal: Adequate nutrition will be maintained Outcome: Progressing   Problem: Coping: Goal: Level of anxiety will decrease Outcome: Progressing   Problem: Education: Goal: Knowledge of General Education information will improve Description: Including pain rating scale, medication(s)/side effects and non-pharmacologic comfort measures Outcome: Not Progressing   Problem: Health Behavior/Discharge Planning: Goal: Ability to manage health-related needs will improve Outcome: Not Progressing   Problem: Activity: Goal: Risk for activity intolerance will decrease Outcome: Not Progressing   Problem: Pain Managment: Goal: General experience of comfort will improve Outcome: Not Progressing   Problem: Skin Integrity: Goal: Risk for impaired skin integrity will decrease Outcome: Not Progressing

## 2023-02-17 NOTE — Progress Notes (Addendum)
RT placed pt on Bipap at this time 15/5 with 5L bled in.

## 2023-02-17 NOTE — Plan of Care (Signed)

## 2023-02-17 NOTE — Progress Notes (Signed)
Cardiology Progress Note   Patient Name: [name] [name] Date of Encounter: 02/17/2023  Primary Cardiologist: Yvonne Kendall, MD  Subjective   Feels better than yesterday but still "rung out" and fatigued.  No chest pain, dyspnea, presyncope, palpitations.  Inpatient Medications    Scheduled Meds:  aspirin EC  81 mg Oral Daily   doxycycline  100 mg Oral Q12H   enoxaparin (LOVENOX) injection  50 mg Subcutaneous Q24H   furosemide  40 mg Intravenous Daily   levothyroxine  25 mcg Oral Q0600   mometasone-formoterol  2 puff Inhalation BID   mupirocin cream   Topical BID   sodium chloride flush  3 mL Intravenous Q12H   Continuous Infusions:  iron sucrose 200 mg (02/17/23 0939)   PRN Meds: acetaminophen **OR** acetaminophen, albuterol, gabapentin, ondansetron **OR** ondansetron (ZOFRAN) IV, polyethylene glycol   Vital Signs    Vitals:   02/16/23 2328 02/17/23 0015 02/17/23 0527 02/17/23 0932  BP: (!) 142/66  133/66 135/61  Pulse: 82  95 97  Resp: 20 (!) 25 20   Temp: 98.2 F (36.8 C)  99.2 F (37.3 C) 98.7 F (37.1 C)  TempSrc: Oral  Oral   SpO2: 94%  96% 96%  Weight:      Height:        Intake/Output Summary (Last 24 hours) at 02/17/2023 1219 Last data filed at 02/17/2023 1054 Gross per 24 hour  Intake 1330.4 ml  Output 2800 ml  Net -1469.6 ml   Filed Weights   02/14/23 1306 02/15/23 1809  Weight: 99.8 kg 96.1 kg    Physical Exam   GEN: Well nourished, well developed, in no acute distress.  HEENT: Grossly normal.  Neck: Supple, no JVD, carotid bruits, or masses. Cardiac: RRR, 3/6 SEM @ RUSB, no rubs or gallops. No clubbing, cyanosis, edema.  Radials 2+, DP/PT 2+ and equal bilaterally.  Respiratory:  Respirations regular and unlabored, diminished breath sounds w/ fine basilar crackles. GI: Soft, nontender, nondistended, BS + x 4. MS: no deformity or atrophy. Skin: warm and dry, no rash. Neuro:  Strength and sensation are intact. Psych: AAOx3.  Normal  affect.  Labs    Chemistry Recent Labs  Lab 02/14/23 1345 02/15/23 0640 02/17/23 0344  NA 140 140 141  K 4.3 4.2 3.6  CL 91* 95* 93*  CO2 38* 37* 38*  GLUCOSE 91 76 91  BUN 32* 28* 24*  CREATININE 1.14 1.04 1.04  CALCIUM 8.2* 8.1* 8.1*  PROT 6.6  --   --   ALBUMIN 2.5*  --   --   AST 20  --   --   ALT 13  --   --   ALKPHOS 127*  --   --   BILITOT 0.6  --   --   GFRNONAA >60 >60 >60  ANIONGAP 11 8 10      Hematology Recent Labs  Lab 02/15/23 0640 02/16/23 0447 02/17/23 0344  WBC 9.3 9.9 11.4*  RBC 2.45* 2.59* 2.82*  HGB 7.3* 7.8* 8.2*  HCT 25.8* 27.4* 27.8*  MCV 105.3* 105.8* 98.6  MCH 29.8 30.1 29.1  MCHC 28.3* 28.5* 29.5*  RDW 14.5 14.4 17.2*  PLT 255 280 305    Cardiac Enzymes  Recent Labs  Lab 02/14/23 1345 02/14/23 1533  TROPONINIHS 12 12      BNP    Component Value Date/Time   BNP 158.0 (H) 02/14/2023 1345   BNP 18 11/01/2021 1607    Lipids  Lab Results  Component  Value Date   CHOL 176 01/16/2021   HDL 53 01/16/2021   LDLCALC 108 (H) 01/16/2021   TRIG 77 01/16/2021   CHOLHDL 3.3 01/16/2021    HbA1c  Lab Results  Component Value Date   HGBA1C 5.3 03/26/2018    Radiology    DG Chest Port 1 View  Result Date: 02/16/2023 CLINICAL DATA:  Shortness of breath. EXAM: PORTABLE CHEST 1 VIEW COMPARISON:  Radiograph and CT 06/10/2022 FINDINGS: Mild cardiomegaly. Unchanged mediastinal contours. TAVR. Retrocardiac opacity likely combination of airspace disease and small pleural effusion. Right upper lobe mass is faintly visualized by radiograph. No pneumothorax. IMPRESSION: 1. Retrocardiac opacity likely combination of airspace disease and small pleural effusion. 2. Mild cardiomegaly. 3. Right upper lobe mass is faintly visualized by radiograph. Electronically Signed   By: Narda Rutherford M.D.   On: 02/16/2023 15:11   MR FOOT LEFT W WO CONTRAST  Result Date: 02/16/2023 CLINICAL DATA:  Great toe infection. EXAM: MRI OF THE LEFT FOREFOOT WITHOUT  AND WITH CONTRAST TECHNIQUE: Multiplanar, multisequence MR imaging of the left forefoot was performed both before and after administration of intravenous contrast. CONTRAST:  10mL GADAVIST GADOBUTROL 1 MMOL/ML IV SOLN COMPARISON:  None Available. FINDINGS: Bones/Joint/Cartilage Suggested skin ulceration over the dorsal aspect of the interphalangeal joint of the great toe. There is underlying marrow T2 hyperintensity and low level enhancement within the head of the 1st proximal phalanx. Questionable mildly decreased T1 marrow signal without gross cortical destruction. The distal 1st phalanx in the 1st metatarsal demonstrate no significant findings. No significant joint effusion or abnormal synovial enhancement. The additional toes and metatarsals appear unremarkable. No significant abnormality of the Lisfranc joint or within the visualized tarsal bones. Ligaments Intact Lisfranc joint. Intact collateral ligaments of the metatarsophalangeal joints. Muscles and Tendons Generalized forefoot muscular atrophy without focal fluid collection or abnormal enhancement. The forefoot tendons appear intact, without significant tenosynovitis. Soft tissues As above, suspected soft tissue ulceration dorsal to the interphalangeal joint of the great toe with associated soft tissue enhancement in this area. No focal fluid collection or foreign body identified. No other focal soft tissue abnormalities or abnormal enhancement identified. IMPRESSION: 1. Suspected soft tissue ulceration over the dorsal aspect of the interphalangeal joint of the great toe with underlying marrow changes in the head of the 1st proximal phalanx suspicious for early osteomyelitis. Correlate clinically. 2. No evidence of septic arthritis or soft tissue abscess. 3. Generalized forefoot muscular atrophy. Electronically Signed   By: Carey Bullocks M.D.   On: 02/16/2023 08:12   Telemetry    RSR, intermittent 2:1 AVB and blocked PACs - Personally  Reviewed  Cardiac Studies   2D Echocardiogram 9.1.2024  1. Left ventricular ejection fraction, by estimation, is 60 to 65%. The  left ventricle has normal function. The left ventricle has no regional  wall motion abnormalities. There is mild left ventricular hypertrophy.  Left ventricular diastolic parameters  are consistent with Grade I diastolic dysfunction (impaired relaxation).  Elevated left ventricular end-diastolic pressure.   2. Right ventricular systolic function is normal. The right ventricular  size is normal. There is severely elevated pulmonary artery systolic  pressure.   3. Left atrial size was severely dilated.   4. Right atrial size was moderately dilated.   5. The mitral valve is normal in structure. Mild mitral valve  regurgitation. Moderate mitral stenosis. The mean mitral valve gradient is  10.0 mmHg. Severe mitral annular calcification.   6. The aortic valve is normal in structure. Aortic valve  regurgitation is  not visualized. Moderate aortic valve stenosis. Aortic valve area, by VTI  measures 1.53 cm. Aortic valve mean gradient measures 25.0 mmHg. Aortic  valve Vmax measures 3.66 m/s.   7. The inferior vena cava is dilated in size with <50% respiratory  variability, suggesting right atrial pressure of 15 mmHg. _____________   Patient Profile     89 y.o. male w/ a h/o severe AS s/p TAVR (06/2015), chronic HFpEF, prediabetes, intermittent 2:1 AVB, chronic hypoxic resp failure/COPD, AAA, OSA, and obesity, who was admitted 8/31 w/ weakness and R thigh ulceration, and was noted to have 2:1 AVB in the ED.  Assessment & Plan    1.  2:1 AVB:  Prior h/o intermittent 2:1 AVB in the absence of presyncope/syncope.  Prev eval by EP and not felt to require pacing.  Again noted to have intermittent 2:1 AVB on admission for cellulitis, weakness, anemia.  Asymptomatic w/o significant pauses or bradycardia. As prev noted, temp pacer/permanent pacer not indicated at this time.   Cont conservative mgmt - avoid AVN blocking agents.  2.  Chronic HFpEF/Pulm HTN:  EF 60-65% w/ GrI DD on echo this admission.  In setting of crackles on exam 9/2, IV lasix started.  Minus 1.9L yesterday.  No wt in several days.  Renal fxn stable.  Feels some better today (also received blood yesterday), though still w/ fine crackles on exam.  Cont IV lasix today.  HR/BP stable.  3.  RLE cellulitis/L great toe osteomyelitis:  Cont abx per IM/podiatry.  4.  Macrocytic anemia:  s/p transfusion 9/2 w/ IV iron planned today.  H/H stable.  5.  S/p TAVR:  Mod AS on echo 9/1.  Blood cultures neg.  No plan for TEE at this time.  6.  HTN:  stable.  7.  Lung Mass:  Enlarging right upper lobe lung mass noted on CT chest in 05/2022. Per admission H&P, Mr. Oshinski was not interested in pursuing any sort of treatment and was unsure if he wished to pursue repeat imaging to evaluate for interval change.   Signed, Nicolasa Ducking, NP  02/17/2023, 12:19 PM    For questions or updates, please contact   Please consult [email or web address] for contact info under Cardiology/STEMI.

## 2023-02-17 NOTE — Progress Notes (Signed)
Physical Therapy Treatment Patient Details Name: [name] [name] MRN: [id number] DOB: June 01, 1934 Today's Date: 02/17/2023   History of Present Illness [name] [name] is a 89 y.o. male with medical history significant of chronic hypoxic respiratory failure on 5 L secondary to COPD, AV heart block, OSA on CPAP, severe aortic stenosis s/p TAVR, PUD, prediabetes, HFpEF, who presents to the ED due to generalized weakness and a wound.    PT Comments  Pt has achieved acute skilled PT goals. Pt is received in bed and slightly tired with BiPAP on, he is agreeable to PT session. RN donned BiPAP off for session. Pt performs bed mobility max A. Educated and demonstrated to Pt and spouse bed rolling techniques to allow donning harness for hoyer lift. Pt performed x3 bed rolling to each side, with spouse participating with one attempt. Communicated with care team via secure chat regarding PT signing off due to goals met. Pt to be discharge from acute skilled PT needs at this time.    If plan is discharge home, recommend the following: Two people to help with walking and/or transfers;A lot of help with bathing/dressing/bathroom;Assistance with cooking/housework;Help with stairs or ramp for entrance;Direct supervision/assist for financial management;Assist for transportation;Supervision due to cognitive status   Can travel by private vehicle        Equipment Recommendations  [place] lift;Wheelchair (measurements PT)    Recommendations for Other Services       Precautions / Restrictions Precautions Precautions: Fall Restrictions Weight Bearing Restrictions: No     Mobility  Bed Mobility Overal bed mobility: Needs Assistance Bed Mobility: Rolling Rolling: Max assist, Used rails         General bed mobility comments: Pt required max A for BUE/BLE positioning and cuing for hand placement to achieve rolling both sides    Transfers                   General transfer comment: Not performed at this  time due to increased fatigue; Pt transfers with use of hoyer lift.    Ambulation/Gait               General Gait Details: Not appropriate due to Pt does not amb at baseline   Stairs             Wheelchair Mobility     Tilt Bed    Modified Rankin (Stroke Patients Only)       Balance Overall balance assessment: Needs assistance   Sitting balance-Leahy Scale: Poor Sitting balance - Comments: Pt demonstrates decreased core activation during bed mobility therefore indicating poor sitting balance, although not formally tested       Standing balance comment: Not performed due to Pt not amb or standing at baseline                            Cognition Arousal: Alert Behavior During Therapy: WFL for tasks assessed/performed Overall Cognitive Status: Within Functional Limits for tasks assessed                                 General Comments: Pleasant and cooperative during session; asked relevant questions to promote independence        Exercises Other Exercises Other Exercises: Educated spouse on rolling techniques and had her participate in performance; reviewed importance behind have 2 people when using hoyer lift use for safety  General Comments        Pertinent Vitals/Pain Pain Assessment Pain Assessment: No/denies pain    Home Living                          Prior Function            PT Goals (current goals can now be found in the care plan section) Acute Rehab PT Goals Patient Stated Goal: To return home PT Goal Formulation: With patient Time For Goal Achievement: 02/10/23 Potential to Achieve Goals: Fair Progress towards PT goals: Goals met and updated - see care plan    Frequency    Min 1X/week      PT Plan      Co-evaluation              AM-PAC PT "6 Clicks" Mobility   Outcome Measure  Help needed turning from your back to your side while in a flat bed without using bedrails?:  Total Help needed moving from lying on your back to sitting on the side of a flat bed without using bedrails?: Total Help needed moving to and from a bed to a chair (including a wheelchair)?: Total Help needed standing up from a chair using your arms (e.g., wheelchair or bedside chair)?: Total Help needed to walk in hospital room?: Total Help needed climbing 3-5 steps with a railing? : Total 6 Click Score: 6    End of Session Equipment Utilized During Treatment: Oxygen Activity Tolerance: Patient limited by fatigue Patient left: in bed;with family/visitor present;with bed alarm set;Other (comment) (BiPAP re-applied) Nurse Communication: Mobility status PT Visit Diagnosis: Muscle weakness (generalized) (M62.81);Difficulty in walking, not elsewhere classified (R26.2)     Time: 1442-1500 PT Time Calculation (min) (ACUTE ONLY): 18 min  Charges:                            Elmon Else, SPT    [place] 02/17/2023, 3:21 PM

## 2023-02-17 NOTE — Progress Notes (Signed)
PT Cancellation Note  Patient Details Name: [name] [name] MRN: [id number] DOB: 02-May-1934   Cancelled Treatment:     Pt reports being too tired and just applied CPAP to get some rest. Spoke with Pt, spouse, and daughter at bedside about plans to return after lunch. Will re-attempt PT visit at a later time.   Hence Derrick 02/17/2023, 11:51 AM

## 2023-02-17 NOTE — Progress Notes (Signed)
[facility]  -  at [facility]   PATIENT NAME: [name] [name]    MR#:  [id number]  DATE OF BIRTH:  Dec 15, 1933  SUBJECTIVE:  patient's wife and daughter at bedside. Wife and daughter agree patient is more awake alert and his near baseline. Eat 75% of his meals today. Breathing much better. Has been using auto CPAP during naps. Normally uses 5 L of nasal count oxygen continuous at home. Received one unit blood transfusion yesterday. Denies any chest pain or respiratory distress. VITALS:  Blood pressure 135/61, pulse 97, temperature 98.7 F (37.1 C), resp. rate 20, height 5\' 10"  (1.778 m), weight 96.1 kg, SpO2 96%.  PHYSICAL EXAMINATION:  limited GENERAL:  89 y.o.-year-old patient with  acute distress. Morbidly obese LUNGS: decreased breath sounds bilaterally, no wheezing CARDIOVASCULAR: S1, S2 normal. No murmur   ABDOMEN: Soft, nontender, nondistended. EXTREMITIES: No  edema b/l.    NEUROLOGIC: nonfocal  patient is lethargic. Awakens answers couple questions. SKIN:  Pressure Injury 05/31/22 Sacrum Stage 2 -  Partial thickness loss of dermis presenting as a shallow open injury with a red, pink wound bed without slough. (Active)  05/31/22 2000  Location: Sacrum  Location Orientation:   Staging: Stage 2 -  Partial thickness loss of dermis presenting as a shallow open injury with a red, pink wound bed without slough.  Wound Description (Comments):   Present on Admission: Yes     Pressure Injury 06/12/22 Buttocks Left Stage 2 -  Partial thickness loss of dermis presenting as a shallow open injury with a red, pink wound bed without slough. (Active)  06/12/22 1824  Location: Buttocks  Location Orientation: Left  Staging: Stage 2 -  Partial thickness loss of dermis presenting as a shallow open injury with a red, pink wound bed without slough.  Wound Description (Comments):   Present on Admission: Yes     Pressure Injury 02/15/23 Thigh Anterior;Right Unstageable - Full  thickness tissue loss in which the base of the injury is covered by slough (yellow, tan, gray, green or brown) and/or eschar (tan, brown or black) in the wound bed. WOC 9/2; this is not pre (Active)  02/15/23 1835  Location: Thigh  Location Orientation: Anterior;Right  Staging: Unstageable - Full thickness tissue loss in which the base of the injury is covered by slough (yellow, tan, gray, green or brown) and/or eschar (tan, brown or black) in the wound bed.  Wound Description (Comments): WOC 9/2; this is not pressure injury; infectious in nature  Present on Admission: Yes      LABORATORY PANEL:  CBC Recent Labs  Lab 02/17/23 0344  WBC 11.4*  HGB 8.2*  HCT 27.8*  PLT 305    Chemistries  Recent Labs  Lab 02/14/23 1345 02/15/23 0640 02/17/23 0344  NA 140   < > 141  K 4.3   < > 3.6  CL 91*   < > 93*  CO2 38*   < > 38*  GLUCOSE 91   < > 91  BUN 32*   < > 24*  CREATININE 1.14   < > 1.04  CALCIUM 8.2*   < > 8.1*  AST 20  --   --   ALT 13  --   --   ALKPHOS 127*  --   --   BILITOT 0.6  --   --    < > = values in this interval not displayed.   Cardiac Enzymes No results for input(s): "TROPONINI" in the last 168  hours. RADIOLOGY:  DG Chest Port 1 View  Result Date: 02/16/2023 CLINICAL DATA:  Shortness of breath. EXAM: PORTABLE CHEST 1 VIEW COMPARISON:  Radiograph and CT 06/10/2022 FINDINGS: Mild cardiomegaly. Unchanged mediastinal contours. TAVR. Retrocardiac opacity likely combination of airspace disease and small pleural effusion. Right upper lobe mass is faintly visualized by radiograph. No pneumothorax. IMPRESSION: 1. Retrocardiac opacity likely combination of airspace disease and small pleural effusion. 2. Mild cardiomegaly. 3. Right upper lobe mass is faintly visualized by radiograph. Electronically Signed   By: Narda Rutherford M.D.   On: 02/16/2023 15:11   MR FOOT LEFT W WO CONTRAST  Result Date: 02/16/2023 CLINICAL DATA:  Great toe infection. EXAM: MRI OF THE LEFT  FOREFOOT WITHOUT AND WITH CONTRAST TECHNIQUE: Multiplanar, multisequence MR imaging of the left forefoot was performed both before and after administration of intravenous contrast. CONTRAST:  10mL GADAVIST GADOBUTROL 1 MMOL/ML IV SOLN COMPARISON:  None Available. FINDINGS: Bones/Joint/Cartilage Suggested skin ulceration over the dorsal aspect of the interphalangeal joint of the great toe. There is underlying marrow T2 hyperintensity and low level enhancement within the head of the 1st proximal phalanx. Questionable mildly decreased T1 marrow signal without gross cortical destruction. The distal 1st phalanx in the 1st metatarsal demonstrate no significant findings. No significant joint effusion or abnormal synovial enhancement. The additional toes and metatarsals appear unremarkable. No significant abnormality of the Lisfranc joint or within the visualized tarsal bones. Ligaments Intact Lisfranc joint. Intact collateral ligaments of the metatarsophalangeal joints. Muscles and Tendons Generalized forefoot muscular atrophy without focal fluid collection or abnormal enhancement. The forefoot tendons appear intact, without significant tenosynovitis. Soft tissues As above, suspected soft tissue ulceration dorsal to the interphalangeal joint of the great toe with associated soft tissue enhancement in this area. No focal fluid collection or foreign body identified. No other focal soft tissue abnormalities or abnormal enhancement identified. IMPRESSION: 1. Suspected soft tissue ulceration over the dorsal aspect of the interphalangeal joint of the great toe with underlying marrow changes in the head of the 1st proximal phalanx suspicious for early osteomyelitis. Correlate clinically. 2. No evidence of septic arthritis or soft tissue abscess. 3. Generalized forefoot muscular atrophy. Electronically Signed   By: Carey Bullocks M.D.   On: 02/16/2023 08:12    Assessment and Plan [name] [name] is a 89 y.o. male with medical  history significant of chronic hypoxic respiratory failure on 5 L secondary to COPD, AV heart block, OSA on CPAP, severe aortic stenosis s/p TAVR, PUD, prediabetes, HFpEF, who presents to the ED due to generalized weakness and a wound.   Additionally, had a small wound in the lateral aspect of the right leg, with increasing erythema.  Also with left great toe wound.  In the emergency department, patient was found to have 2:1 AV block.  Cardiology was consulte  Second-degree AV block Acute on Chronic CHF ,diastolic with severe PHT - [facility]iology consulted--Dr Ends input appreciated -Avoid AV nodal blocking agents, currently no indication for pacemaker placement --IV lasix--with good UOP  Left great toe wound Cellulitis of right lateral thigh -Continue vancomycin, ceftriaxone for now--wound looks dry--change to po abxs -Wound RN  -MRI to rule out osteo -- Suspected soft tissue ulceration over the dorsal aspect of the interphalangeal joint of the great toe with underlying marrow changes in the head of the 1st proximal phalanx suspicious for early osteomyelitis. Correlate clinically. 2. No evidence of septic arthritis or soft tissue abscess. 3. Generalized forefoot muscular atrophy. --Podaitry consult with Dr Annamary Rummage  vitiated. Does not recommend any surgery at present given overall chronic health condition. Patient's family also does not want any surgery. -- Per podiatry Wound care: Recommend 2-3x weekly home dressing change - will need home health RN for this.  - For wound care I recommend mupirocin ointment applied to the wound, cover with silver alginate dressing and wrap with small amount of in guaze roll secure with tape - WB status: WBAT to LLE in post op shoe - ordered   Symptomatic anemia -Baseline hemoglobin between 9 and 10 no reported history of bleeding -Iron panel consistent with anemia of chronic disease /IDA -- patient's hemoglobin down to 7.8. Given significant cardiac  history discussed with cardiology and will transfused one unit blood transfusion. Consent from daughter and wife obtained. Will also give IV iron given significant low serum iron. -- Hemoglobin 8.2. Received one unit blood transfusion   Lung nodule -Has been enlarging since previous imaging in 2016.  Had previously declined repeat imaging or pursuing any sort of treatment   COPD with chronic hypoxic, hypercapnic respiratory failure  - ABG to see CO2 retention --> CO2 101-->66 today  -- cont BiPAP for hypercapnic resp failure -- change to auto CPAP for home per RT recs--TOC made aware to inform [facility] --avoid sedating meds   Hypothyroidism -Synthroid    Status post TAVR -Aspirin   Morbid obesity with bedbound status. Chronic   palliative care consultation. Patient has multiple comorbidities.  Family agreeable with plan.  DVT prophylaxis: Lovenox  Procedures: Family communication : daughter and wife at bedside Consults : [facility] cardiology, podiatry CODE STATUS: DNR Level of care: Progressive Status is: Inpatient Remains inpatient appropriate because: hypercapnia respiratory failure, getting blood transfusion, getting IV Lasix and IV venofer  D/c in 1-2 days    TOTAL TIME TAKING CARE OF THIS PATIENT: 45 minutes.  >50% time spent on counselling and coordination of care  Note: This dictation was prepared with Dragon dictation along with smaller phrase technology. Any transcriptional errors that result from this process are unintentional.  Enedina Finner M.D    [facility]   CC: Primary care physician; Joaquim Nam, MD

## 2023-02-17 NOTE — Consult Note (Signed)
Consultation Note Date: 02/17/2023 at 1100  Patient Name: [name] [name]  DOB: [birth year]  MRN: [id number]  Age / Sex: 89 y.o., male  PCP: Joaquim Nam, MD Referring Physician: Enedina Finner, MD  Reason for Consultation: Establishing goals of care  HPI/Patient Profile: 89 y.o. male  with past medical history of chronic hypoxic respiratory failure due to COPD (5L of [place] at baseline), AV heart block, OSA (CPAP), severe aortic stenosis s/p TAVR, PUD, prediabetes, and HFpEF admitted on 02/14/2023 with weakness and wound of left great toe.    Pt is being treated for second-degree AV block, acute on chronic diastolic CHF, left great toe wound and cellulitis of right lateral thigh, symptomatic anemia, COPD, and hypothyroidism.  Podiatry and cardiology were consulted.  MRI of left foot revealed suspected soft tissue ulceration over the dorsal aspect of the interphalangeal joint of the great toe with underlying marrow changes in the head of the first proximal flank suspicious for early osteomyelitis. Podiatry has recommended local wound care and p.o. antibiotics.  Cardiology is recommending to continue Lasix daily p.o.  If blood cultures become positive, a TEE will need to be considered.  PMT was consulted to discuss goals of care.  Clinical Assessment and Goals of Care: I have reviewed medical records including EPIC notes, labs and imaging, assessed the patient and then met with patient, his wife [name], and daughter Gavin Pound at bedside to discuss diagnosis prognosis, GOC, EOL wishes, disposition and options.  I introduced Palliative Medicine as specialized medical care for people living with serious illness. It focuses on providing relief from the symptoms and stress of a serious illness. The goal is to improve quality of life for both the patient and the family.  Patient was somnolent and intermittently arousable  during our discussion.  Symptoms assessed.  Patient endorses he is sleepy.  He denies pain, discomfort, headache, chest pain, constipation, or other acute issues at this time.  No adjustment to [facility] needed.  Patient requested to be repositioned in bed.  Bed extended as patient is tall and pressure relieved from toes/heels.  Patient appreciative and had no other concerns at this time.  Family endorses he had poor sleep last night and has been napping more during the day.  They are requesting CPAP places patient appears to be wanting to take a nap at this time.  Majority of our discussion and information is provided by patient's wife and patient's daughter.  As far as functional status, family endorses patient has been bedbound for several months.  He participated with PT but when PT was discontinued he remained in bed, unmotivated to continue on with mobility exercises.  As far as nutritional status, family endorses patient has a healthy appetite.  Patient says that he eats even when he does not want to because he knows it is important.  We discussed importance of functional, nutritional, and cognitive status as significant indicators of patient's overall prognosis.    We discussed patient's current illness and what it means in the larger  context of patient's on-going co-morbidities.  Natural disease trajectory discussed.  Education provided on CPAP, BiPAP, and CO2 retention.   I attempted to elicit values and goals of care important to the patient.  Patient shares he was worried and thought the end was coming a few days ago.  Family shares that patient was not well over the previous 3 days but has made significant improvement over the last 12 hours.  Family shares it was as if the patient "woke up" yesterday.  They share he is behaving more like himself today.  Patient also endorses he feels better today than the previous days.  Quality of life, boundaries to medical care, and goals of treatments  discussed.  Family shares that they are hopeful patient can return home with home health.  With the improvements he has shown today, they are hopeful that he can return to his baseline and return home within the next day or so.  Advance directives and CODE STATUS discussed.  Patient's current CODE STATUS is DNR with limited interventions.  Family shares they know the patient would never want to be placed on a ventilator.  However, they share he would be accepting of CPR.  Discussed the significance of cardio and pulmonary resuscitative efforts jointly in the event of a cardiopulmonary arrest.  Family shares they understand that his current CODE STATUS is DNR with limited interventions and do not wish to discuss it further today.  Therapeutic silence, active listening, and emotional support provided.   Discussed with patient/family the importance of continued conversation with family and the medical providers regarding overall plan of care and treatment options, ensuring decisions are within the context of the patient's values and GOCs.    Questions and concerns were addressed. The family was encouraged to call with questions or concerns.   PMT will continue to follow.   Primary Decision Maker PATIENT  Physical Exam Vitals reviewed.  Constitutional:      General: He is not in acute distress.    Appearance: He is normal weight.     Comments: Sleepy  HENT:     Head: Normocephalic.     Mouth/Throat:     Mouth: Mucous membranes are moist.     Pharynx: Oropharynx is clear.  Eyes:     Pupils: Pupils are equal, round, and reactive to light.  Pulmonary:     Effort: Pulmonary effort is normal.  Abdominal:     Palpations: Abdomen is soft.  Musculoskeletal:     Comments: Generalized weakness  Skin:    General: Skin is warm and dry.  Neurological:     Mental Status: He is alert and oriented to person, place, and time.  Psychiatric:        Behavior: Behavior normal.        Thought Content:  Thought content normal.        Judgment: Judgment normal.     Palliative Assessment/Data: 40%     Thank you for this consult. Palliative medicine will continue to follow and assist holistically.   Time Total: 75 minutes  Signed by: Georgiann Cocker, DNP, FNP-BC Palliative Medicine    Please contact Palliative Medicine Team phone at [phone] for questions and concerns.  For individual provider: See Loretha Stapler

## 2023-02-18 DIAGNOSIS — I272 Pulmonary hypertension, unspecified: Secondary | ICD-10-CM

## 2023-02-18 DIAGNOSIS — J438 Other emphysema: Secondary | ICD-10-CM | POA: Diagnosis not present

## 2023-02-18 DIAGNOSIS — L03115 Cellulitis of right lower limb: Secondary | ICD-10-CM | POA: Diagnosis not present

## 2023-02-18 DIAGNOSIS — I441 Atrioventricular block, second degree: Secondary | ICD-10-CM | POA: Diagnosis not present

## 2023-02-18 DIAGNOSIS — R531 Weakness: Secondary | ICD-10-CM | POA: Diagnosis not present

## 2023-02-18 DIAGNOSIS — I5033 Acute on chronic diastolic (congestive) heart failure: Secondary | ICD-10-CM | POA: Diagnosis not present

## 2023-02-18 LAB — BASIC METABOLIC PANEL
Anion gap: 9 (ref 5–15)
BUN: 21 mg/dL (ref 8–23)
CO2: 40 mmol/L — ABNORMAL HIGH (ref 22–32)
Calcium: 8 mg/dL — ABNORMAL LOW (ref 8.9–10.3)
Chloride: 89 mmol/L — ABNORMAL LOW (ref 98–111)
Creatinine, Ser: 0.92 mg/dL (ref 0.61–1.24)
GFR, Estimated: >60 mL/min (ref >60)
Glucose, Bld: 133 mg/dL — ABNORMAL HIGH (ref 70–99)
Potassium: 3.8 mmol/L (ref 3.5–5.1)
Sodium: 138 mmol/L (ref 135–145)

## 2023-02-18 MED ORDER — FUROSEMIDE 10 MG/ML IJ SOLN: 40.0000 mg | Freq: Two times a day (BID) | INTRAMUSCULAR | Status: DC

## 2023-02-18 MED ORDER — FUROSEMIDE 10 MG/ML IJ SOLN
40.0000 mg | Freq: Three times a day (TID) | INTRAMUSCULAR | Status: AC
  Administered 2023-02-18 (×2): 40 mg via INTRAVENOUS
  Filled 2023-02-18 (×2): qty 4

## 2023-02-18 NOTE — Plan of Care (Signed)
  Problem: Nutrition: Goal: Adequate nutrition will be maintained Outcome: Progressing   Problem: Coping: Goal: Level of anxiety will decrease Outcome: Progressing   Problem: Elimination: Goal: Will not experience complications related to bowel motility Outcome: Progressing Goal: Will not experience complications related to urinary retention Outcome: Progressing   Problem: Safety: Goal: Ability to remain free from injury will improve Outcome: Progressing   Problem: Health Behavior/Discharge Planning: Goal: Ability to manage health-related needs will improve Outcome: Not Progressing   Problem: Activity: Goal: Risk for activity intolerance will decrease Outcome: Not Progressing

## 2023-02-18 NOTE — Progress Notes (Signed)
Palliative Care Progress Note, Assessment & Plan   Patient Name: [name] [name]       Date: 02/18/2023 DOB: [birth year]  Age: 89 y.o. MRN#: [id number] Attending Physician: Enedina Finner, MD Primary Care Physician: Joaquim Nam, MD Admit Date: 02/14/2023  Subjective: Patient is sitting up in bed, sleeping, he is easily awakened.  He acknowledges my presence and is able to make his wishes known.  His wife is at bedside during my visit.  HPI: 89 y.o. male  with past medical history of chronic hypoxic respiratory failure due to COPD (5L of [place] at baseline), AV heart block, OSA (CPAP), severe aortic stenosis s/p TAVR, PUD, prediabetes, and HFpEF admitted on 02/14/2023 with weakness and wound of left great toe.     Pt is being treated for second-degree AV block, acute on chronic diastolic CHF, left great toe wound and cellulitis of right lateral thigh, symptomatic anemia, COPD, and hypothyroidism.   Podiatry and cardiology were consulted.   MRI of left foot revealed suspected soft tissue ulceration over the dorsal aspect of the interphalangeal joint of the great toe with underlying marrow changes in the head of the first proximal flank suspicious for early osteomyelitis. Podiatry has recommended local wound care and p.o. antibiotics.   Cardiology is recommending to continue Lasix daily p.o.  If blood cultures become positive, a TEE will need to be considered.   PMT was consulted to discuss goals of care.  Summary of counseling/coordination of care: After reviewing the patient's chart and assessing the patient at bedside, I spoke with patient in regards to symptom management and goals of care.  Symptoms assessed.  Patient endorses that he feels almost back to normal.  He denies pain, headache, constipation, or  discomfort.  He endorses mild eye irritation.  Eye exam revealed sclera WNL.  No signs of redness or irritation noted.  Scant drainage EO noted in bilateral tear ducts.  Clear eyes 4 times daily available as needed.  Discussed use of eyedrops for comfort as needed.  Patient endorsed understanding.  No adjustment to [facility] needed at this time.  Goals and boundaries of care discussed.  Patient and spouse remain in agreement with DNR and limited interventions, wishing to treat the treatable.  They both remain hopeful that patient can return home, perhaps tomorrow.    Discussed that patient's nutrition, functional, and cognitive status have improved during hospitalization, but that functional status remains poor.  Wife shares she is hopeful that the appropriate lift equipment and home health help can assist with getting patient more mobile.  Goals clear.    Symptom burden is low.  Patient/family have PMT contact information and were encouraged to contact PMT with any future acute palliative needs.   PMT will monitor the patient peripherally and shadow his chart. Please re-engage with PMT if goals change, at patient/family's request, or if patient's health deteriorates during hospitalization.    Physical Exam Vitals reviewed.  Constitutional:      General: He is not in acute distress.    Appearance: He is normal weight. He is not ill-appearing.  HENT:     Head: Normocephalic.     Mouth/Throat:     Mouth: Mucous membranes  are moist.  Eyes:     Conjunctiva/sclera: Conjunctivae normal.     Pupils: Pupils are equal, round, and reactive to light.  Pulmonary:     Effort: Pulmonary effort is normal.  Musculoskeletal:     Comments: MAETC, generalized weakness  Skin:    General: Skin is warm and dry.  Neurological:     Mental Status: He is alert and oriented to person, place, and time.  Psychiatric:        Behavior: Behavior normal.        Thought Content: Thought content normal.         Judgment: Judgment normal.             Total Time 25 minutes   Reeve Turnley L. Bonita Quin, DNP, FNP-BC Palliative Medicine Team

## 2023-02-18 NOTE — Progress Notes (Addendum)
Cardiology Progress Note   Patient Name: [name] [name] Date of Encounter: 02/18/2023  Primary Cardiologist: Yvonne Kendall, MD  Subjective  Family at the bedside He reports feeling weak Since sequential hospitalizations end of 2023, has been immobile at home essentially bedbound Reports that he does not walk even with a walker Wife helps him at home, family assist He would like to try sitting on the side of the bed Receiving iron infusion today Denies significant leg swelling or abdominal distention On 5 L nasal cannula oxygen chronically at home, 5 L here  Inpatient Medications    Scheduled Meds:  aspirin EC  81 mg Oral Daily   doxycycline  100 mg Oral Q12H   enoxaparin (LOVENOX) injection  50 mg Subcutaneous Q24H   furosemide  40 mg Intravenous Daily   levothyroxine  25 mcg Oral Q0600   mometasone-formoterol  2 puff Inhalation BID   mupirocin cream   Topical BID   sodium chloride flush  3 mL Intravenous Q12H   Continuous Infusions:  iron sucrose Stopped (02/17/23 0940)   PRN Meds: acetaminophen **OR** acetaminophen, albuterol, gabapentin, naphazoline-glycerin, ondansetron **OR** ondansetron (ZOFRAN) IV, polyethylene glycol   Vital Signs    Vitals:   02/17/23 2300 02/18/23 0514 02/18/23 0622 02/18/23 0754  BP: 133/66 125/60  (!) 115/49  Pulse: 78 82  85  Resp: 20 20 (!) 32 19  Temp: 98.4 F (36.9 C) 99.9 F (37.7 C)  99.8 F (37.7 C)  TempSrc: Oral Oral    SpO2: 97% 97%  99%  Weight:      Height:        Intake/Output Summary (Last 24 hours) at 02/18/2023 0900 Last data filed at 02/18/2023 0754 Gross per 24 hour  Intake 1000.5 ml  Output 1850 ml  Net -849.5 ml   Filed Weights   02/14/23 1306 02/15/23 1809  Weight: 99.8 kg 96.1 kg    Physical Exam   Constitutional:  oriented to person, place, and time. No distress.  HENT:  Head: Grossly normal Eyes:  no discharge. No scleral icterus.  Neck: Unable to estimate JVD, no carotid bruits  Cardiovascular:  Regular rate and rhythm, no murmurs appreciated Pulmonary/Chest: Clear to auscultation bilaterally, no wheezes  Scattered Rales at the bases Abdominal: Soft.  no distension.  no tenderness.  Musculoskeletal: Normal range of motion Neurological:  normal muscle tone. Coordination normal. No atrophy Skin: Skin warm and dry Psychiatric: normal affect, pleasant   Labs    Chemistry Recent Labs  Lab 02/14/23 1345 02/15/23 0640 02/17/23 0344  NA 140 140 141  K 4.3 4.2 3.6  CL 91* 95* 93*  CO2 38* 37* 38*  GLUCOSE 91 76 91  BUN 32* 28* 24*  CREATININE 1.14 1.04 1.04  CALCIUM 8.2* 8.1* 8.1*  PROT 6.6  --   --   ALBUMIN 2.5*  --   --   AST 20  --   --   ALT 13  --   --   ALKPHOS 127*  --   --   BILITOT 0.6  --   --   GFRNONAA >60 >60 >60  ANIONGAP 11 8 10      Hematology Recent Labs  Lab 02/15/23 0640 02/16/23 0447 02/17/23 0344  WBC 9.3 9.9 11.4*  RBC 2.45* 2.59* 2.82*  HGB 7.3* 7.8* 8.2*  HCT 25.8* 27.4* 27.8*  MCV 105.3* 105.8* 98.6  MCH 29.8 30.1 29.1  MCHC 28.3* 28.5* 29.5*  RDW 14.5 14.4 17.2*  PLT 255 280  305    Cardiac Enzymes  Recent Labs  Lab 02/14/23 1345 02/14/23 1533  TROPONINIHS 12 12      BNP    Component Value Date/Time   BNP 158.0 (H) 02/14/2023 1345   BNP 18 11/01/2021 1607   Lipids  Lab Results  Component Value Date   CHOL 176 01/16/2021   HDL 53 01/16/2021   LDLCALC 108 (H) 01/16/2021   TRIG 77 01/16/2021   CHOLHDL 3.3 01/16/2021    HbA1c  Lab Results  Component Value Date   HGBA1C 5.3 03/26/2018    Radiology    DG Chest Port 1 View  Result Date: 02/16/2023 CLINICAL DATA:  Shortness of breath. EXAM: PORTABLE CHEST 1 VIEW COMPARISON:  Radiograph and CT 06/10/2022 FINDINGS: Mild cardiomegaly. Unchanged mediastinal contours. TAVR. Retrocardiac opacity likely combination of airspace disease and small pleural effusion. Right upper lobe mass is faintly visualized by radiograph. No pneumothorax. IMPRESSION: 1. Retrocardiac opacity  likely combination of airspace disease and small pleural effusion. 2. Mild cardiomegaly. 3. Right upper lobe mass is faintly visualized by radiograph. Electronically Signed   By: Narda Rutherford M.D.   On: 02/16/2023 15:11   MR FOOT LEFT W WO CONTRAST  Result Date: 02/16/2023 CLINICAL DATA:  Great toe infection. EXAM: MRI OF THE LEFT FOREFOOT WITHOUT AND WITH CONTRAST TECHNIQUE: Multiplanar, multisequence MR imaging of the left forefoot was performed both before and after administration of intravenous contrast. CONTRAST:  10mL GADAVIST GADOBUTROL 1 MMOL/ML IV SOLN COMPARISON:  None Available. FINDINGS: Bones/Joint/Cartilage Suggested skin ulceration over the dorsal aspect of the interphalangeal joint of the great toe. There is underlying marrow T2 hyperintensity and low level enhancement within the head of the 1st proximal phalanx. Questionable mildly decreased T1 marrow signal without gross cortical destruction. The distal 1st phalanx in the 1st metatarsal demonstrate no significant findings. No significant joint effusion or abnormal synovial enhancement. The additional toes and metatarsals appear unremarkable. No significant abnormality of the Lisfranc joint or within the visualized tarsal bones. Ligaments Intact Lisfranc joint. Intact collateral ligaments of the metatarsophalangeal joints. Muscles and Tendons Generalized forefoot muscular atrophy without focal fluid collection or abnormal enhancement. The forefoot tendons appear intact, without significant tenosynovitis. Soft tissues As above, suspected soft tissue ulceration dorsal to the interphalangeal joint of the great toe with associated soft tissue enhancement in this area. No focal fluid collection or foreign body identified. No other focal soft tissue abnormalities or abnormal enhancement identified. IMPRESSION: 1. Suspected soft tissue ulceration over the dorsal aspect of the interphalangeal joint of the great toe with underlying marrow changes in  the head of the 1st proximal phalanx suspicious for early osteomyelitis. Correlate clinically. 2. No evidence of septic arthritis or soft tissue abscess. 3. Generalized forefoot muscular atrophy. Electronically Signed   By: Carey Bullocks M.D.   On: 02/16/2023 08:12    Telemetry    RSR, intermittent 2:1 AVB w/o sustained pause or bradycaria - Personally Reviewed  Cardiac Studies   2D Echocardiogram 9.1.2024  1. Left ventricular ejection fraction, by estimation, is 60 to 65%. The  left ventricle has normal function. The left ventricle has no regional  wall motion abnormalities. There is mild left ventricular hypertrophy.  Left ventricular diastolic parameters  are consistent with Grade I diastolic dysfunction (impaired relaxation).  Elevated left ventricular end-diastolic pressure.   2. Right ventricular systolic function is normal. The right ventricular  size is normal. There is severely elevated pulmonary artery systolic  pressure.   3. Left atrial size  was severely dilated.   4. Right atrial size was moderately dilated.   5. The mitral valve is normal in structure. Mild mitral valve  regurgitation. Moderate mitral stenosis. The mean mitral valve gradient is  10.0 mmHg. Severe mitral annular calcification.   6. The aortic valve is normal in structure. Aortic valve regurgitation is  not visualized. Moderate aortic valve stenosis. Aortic valve area, by VTI  measures 1.53 cm. Aortic valve mean gradient measures 25.0 mmHg. Aortic  valve Vmax measures 3.66 m/s.   7. The inferior vena cava is dilated in size with <50% respiratory  variability, suggesting right atrial pressure of 15 mmHg.  Patient Profile     89 y.o. male w/ a h/o severe AS s/p TAVR (06/2015), chronic HFpEF, prediabetes, intermittent 2:1 AVB, chronic hypoxic resp failure/COPD, AAA, OSA, and obesity, who was admitted 8/31 w/ weakness and R thigh ulceration, and was noted to have 2:1 AVB in the ED.   Assessment & Plan     1.  2:1 AVB:   Prior h/o intermittent 2:1 AVB in the absence of presyncope/syncope.  Prev eval by EP and not felt to require pacing.   -Telemetry again demonstrating intermittent 2:1 AVB on admission likely exacerbated by cellulitis, weakness, anemia.  Rare dropped beats noted on telemetry past 24 hours Headache w/o significant pauses or bradycardia.  No indication for temporary pacer or permanent pacer Cont conservative mgmt - avoid AVN blocking agents.   2.  Chronic HFpEF/Pulm HTN:   EF 60-65% w/ GrI DD on echo this admission.  Severe pulmonary hypertension noted  On admission, crackles on exam,  IV lasix started.  -Plan to continue IV Lasix, increase up to twice daily dosing -Unable to exclude chronic thromboembolism as cause of his pulmonary hypertension as he is sedentary in bed, relatively immobile.  Not a good candidate for aggressive anticoagulation at this time given anemia -Reevaluation of hemoglobin as outpatient, once hemoglobin 10 or greater could consider reduced dose Xarelto 10 mg daily for DVT prophylaxis as he is very sedentary in bed  3.  RLE cellulitis/L great toe osteomyelitis:   Cont abx per IM/podiatry.  4.  Lung mass, right upper lobe 3.9 cm noted on prior CT scan December 2023 Decided not to pursue aggressive means  5.  Status post TAVR Placed 2016 Mean gradient estimated at 25 mmHg, gradient higher compared to prior study October 2022, 14 mmHg No intervention needed at this time but this may contribute to fluid retention/diastolic CHF symptoms  6.  Anemia Transfusion September 2 Iron infusion today  7.  Immobility Family reports that he seemed to give up and is now immobile over the past year Worse after hospitalizations end of 2023, sepsis with E. Coli  8. AAA,  Estimated 5 cm Not a good candidate for intervention at this time   Total encounter time more than 50 minutes  Greater than 50% was spent in counseling and coordination of care with the  patient   Signed, Dossie Arbour, MD, Ph.D [facility]  For questions or updates, please contact   Please consult [email or web address] for contact info under Cardiology/STEMI.

## 2023-02-18 NOTE — Progress Notes (Signed)
[facility]  - [place] [facility]   PATIENT NAME: [name] [name]    MR#:  [id number]  DATE OF BIRTH:  Nov 21, 1933  SUBJECTIVE:  patient's wife and daughter at bedside.  Patient overall back to baseline per family. Good urine output. Given elevated pulmonary pressures cardiology recommends additional doses of Lasix. Discussed with patient and wife agreeable to stay another day. VITALS:  Blood pressure (!) 143/65, pulse 87, temperature 99 F (37.2 C), resp. rate 16, height 5\' 10"  (1.778 m), weight 96.1 kg, SpO2 99%.  PHYSICAL EXAMINATION:  limited GENERAL:  89 y.o.-year-old patient with  acute distress. Morbidly obese LUNGS: decreased breath sounds bilaterally, no wheezing CARDIOVASCULAR: S1, S2 normal. No murmur   ABDOMEN: Soft, nontender, nondistended. EXTREMITIES: trace  edema b/l.    NEUROLOGIC: nonfocal  patient is alert and ox3 SKIN:  Pressure Injury 05/31/22 Sacrum Stage 2 -  Partial thickness loss of dermis presenting as a shallow open injury with a red, pink wound bed without slough. (Active)  05/31/22 2000  Location: Sacrum  Location Orientation:   Staging: Stage 2 -  Partial thickness loss of dermis presenting as a shallow open injury with a red, pink wound bed without slough.  Wound Description (Comments):   Present on Admission: Yes     Pressure Injury 06/12/22 Buttocks Left Stage 2 -  Partial thickness loss of dermis presenting as a shallow open injury with a red, pink wound bed without slough. (Active)  06/12/22 1824  Location: Buttocks  Location Orientation: Left  Staging: Stage 2 -  Partial thickness loss of dermis presenting as a shallow open injury with a red, pink wound bed without slough.  Wound Description (Comments):   Present on Admission: Yes     Pressure Injury 02/15/23 Thigh Anterior;Right Unstageable - Full thickness tissue loss in which the base of the injury is covered by slough (yellow, tan, gray, green or brown) and/or eschar (tan,  brown or black) in the wound bed. WOC 9/2; this is not pre (Active)  02/15/23 1835  Location: Thigh  Location Orientation: Anterior;Right  Staging: Unstageable - Full thickness tissue loss in which the base of the injury is covered by slough (yellow, tan, gray, green or brown) and/or eschar (tan, brown or black) in the wound bed.  Wound Description (Comments): WOC 9/2; this is not pressure injury; infectious in nature  Present on Admission: Yes      LABORATORY PANEL:  CBC Recent Labs  Lab 02/17/23 0344  WBC 11.4*  HGB 8.2*  HCT 27.8*  PLT 305    Chemistries  Recent Labs  Lab 02/14/23 1345 02/15/23 0640 02/18/23 1015  NA 140   < > 138  K 4.3   < > 3.8  CL 91*   < > 89*  CO2 38*   < > 40*  GLUCOSE 91   < > 133*  BUN 32*   < > 21  CREATININE 1.14   < > 0.92  CALCIUM 8.2*   < > 8.0*  AST 20  --   --   ALT 13  --   --   ALKPHOS 127*  --   --   BILITOT 0.6  --   --    < > = values in this interval not displayed.   Assessment and Plan CORAY CRITCHLEY is a 89 y.o. male with medical history significant of chronic hypoxic respiratory failure on 5 L secondary to COPD, AV heart block, OSA on CPAP, severe aortic  stenosis s/p TAVR, PUD, prediabetes, HFpEF, who presents to the ED due to generalized weakness and a wound.   Additionally, had a small wound in the lateral aspect of the right leg, with increasing erythema.  Also with left great toe wound.  In the emergency department, patient was found to have 2:1 AV block.  Cardiology was consulte  Second-degree AV block Acute on Chronic CHF ,diastolic with severe PHT - [facility] consulted--Dr Ends input appreciated -Avoid AV nodal blocking agents, currently no indication for pacemaker placement --IV lasix--with good UOP --increased lasix to 40 mg bid today due to higher pulmonary pressures per Dr Mariah Milling --creat stable --d/w dr Bing Plume will be monitored as out pt and if stable>10.0 then consider low dose eliquis or xarelto  with h/o PHT    Left great toe wound Cellulitis of right lateral thigh -Continue vancomycin, ceftriaxone for now--wound looks dry--change to po abxs -Wound RN  -MRI to rule out osteo -- Suspected soft tissue ulceration over the dorsal aspect of the interphalangeal joint of the great toe with underlying marrow changes in the head of the 1st proximal phalanx suspicious for early osteomyelitis. Correlate clinically. 2. No evidence of septic arthritis or soft tissue abscess. 3. Generalized forefoot muscular atrophy. --Podaitry consult with Dr Annamary Rummage vitiated. Does not recommend any surgery at present given overall chronic health condition. Patient's family also does not want any surgery. -- Per podiatry Wound care: Recommend 2-3x weekly home dressing change - will need home health RN for this.  - For wound care I recommend mupirocin ointment applied to the wound, cover with silver alginate dressing and wrap with small amount of in guaze roll secure with tape - WB status: WBAT to LLE in post op shoe - ordered   Symptomatic anemia -Baseline hemoglobin between 9 and 10 no reported history of bleeding -Iron panel consistent with anemia of chronic disease /IDA -- patient's hemoglobin down to 7.8. Given significant cardiac history discussed with cardiology and will transfused one unit blood transfusion. Consent from daughter and wife obtained. Will also give IV iron given significant low serum iron. -- Hemoglobin 8.2. Received one unit blood transfusion --d/w dr Bing Plume will be monitored as out pt and if stable>10.0 then consider low dose eliquis or xarelto with h/o PHT   Lung nodule -Has been enlarging since previous imaging in 2016.  Had previously declined repeat imaging or pursuing any sort of treatment   COPD with chronic hypoxic, hypercapnic respiratory failure  - ABG to see CO2 retention --> CO2 101-->66 today  -- cont BiPAP for hypercapnic resp failure -- change to auto CPAP for  home per RT recs--TOC made aware to inform [facility] --avoid sedating meds   Hypothyroidism -Synthroid    Status post TAVR -Aspirin   Morbid obesity with bedbound status. Chronic   palliative care consultation appreciated. Patient has multiple comorbidities.  Family agreeable with plan.  DVT prophylaxis: Lovenox  Procedures: Family communication : daughter and wife at bedside Consults : [facility] cardiology, podiatry CODE STATUS: DNR Level of care: Progressive Status is: Inpatient Remains inpatient appropriate because: one additional day to get IV lasix    TOTAL TIME TAKING CARE OF THIS PATIENT: 35 minutes.  >50% time spent on counselling and coordination of care  Note: This dictation was prepared with Dragon dictation along with smaller phrase technology. Any transcriptional errors that result from this process are unintentional.  Enedina Finner M.D    [facility]   CC: Primary care physician; Para March,  Dwana Curd, MD

## 2023-02-18 NOTE — Care Management Important Message (Signed)
Important Message  Patient Details  Name: [name] [name] MRN: [id number] Date of Birth: [birth year]   Medicare Important Message Given:  Yes     [name] 02/18/2023, 10:44 AM

## 2023-02-19 DIAGNOSIS — I5033 Acute on chronic diastolic (congestive) heart failure: Secondary | ICD-10-CM | POA: Diagnosis not present

## 2023-02-19 DIAGNOSIS — I441 Atrioventricular block, second degree: Secondary | ICD-10-CM | POA: Diagnosis not present

## 2023-02-19 DIAGNOSIS — R531 Weakness: Secondary | ICD-10-CM | POA: Diagnosis not present

## 2023-02-19 DIAGNOSIS — L03115 Cellulitis of right lower limb: Secondary | ICD-10-CM | POA: Diagnosis not present

## 2023-02-19 LAB — CULTURE, BLOOD (ROUTINE X 2)
Culture: NO GROWTH
Culture: NO GROWTH

## 2023-02-19 MED ORDER — DOXYCYCLINE HYCLATE 100 MG PO TABS: 100.0000 mg | ORAL_TABLET | Freq: Two times a day (BID) | ORAL | 0 refills | Status: DC

## 2023-02-19 MED ORDER — TORSEMIDE 20 MG PO TABS: 40.0000 mg | ORAL_TABLET | Freq: Every day | ORAL | Status: DC

## 2023-02-19 MED ORDER — FUROSEMIDE 40 MG PO TABS: 40.0000 mg | ORAL_TABLET | Freq: Every day | ORAL | Status: DC

## 2023-02-19 MED ORDER — POTASSIUM CHLORIDE CRYS ER 20 MEQ PO TBCR: 20.0000 meq | EXTENDED_RELEASE_TABLET | Freq: Every day | ORAL | Status: DC

## 2023-02-19 MED ORDER — TORSEMIDE 40 MG PO TABS: 40.0000 mg | ORAL_TABLET | Freq: Every day | ORAL | 1 refills | Status: DC

## 2023-02-19 MED ORDER — FUROSEMIDE 40 MG PO TABS
40.0000 mg | ORAL_TABLET | Freq: Every day | ORAL | Status: DC
Start: 2023-02-20 — End: 2023-02-19

## 2023-02-19 MED ORDER — POLYETHYLENE GLYCOL 3350 17 G PO PACK: 17.0000 g | PACK | Freq: Every day | ORAL | 0 refills | Status: DC | PRN

## 2023-02-19 MED ORDER — POTASSIUM CHLORIDE CRYS ER 20 MEQ PO TBCR
40.0000 meq | EXTENDED_RELEASE_TABLET | Freq: Once | ORAL | Status: AC
  Administered 2023-02-19: 40 meq via ORAL
  Filled 2023-02-19: qty 2

## 2023-02-19 MED ORDER — POTASSIUM CHLORIDE CRYS ER 20 MEQ PO TBCR: 20.0000 meq | EXTENDED_RELEASE_TABLET | Freq: Every day | ORAL | 1 refills | Status: DC

## 2023-02-19 MED ORDER — FUROSEMIDE 10 MG/ML IJ SOLN
60.0000 mg | Freq: Once | INTRAMUSCULAR | Status: AC
  Administered 2023-02-19: 60 mg via INTRAVENOUS
  Filled 2023-02-19: qty 6

## 2023-02-19 NOTE — Discharge Instructions (Addendum)
Use your chronic oxygen, nebs, inhalers as before Dressing changes on left great toe as instructed

## 2023-02-19 NOTE — Discharge Summary (Signed)
Physician Discharge Summary   Patient: [name] [name] MRN: [id number] DOB: 1933/09/22  Admit date:     02/14/2023  Discharge date: 02/19/23  Discharge Physician: Enedina Finner   PCP: Joaquim Nam, MD   Recommendations at discharge:   F/u PCP in 1-2 weeks F/u Dr Mariah Milling [facility] cardiology in 10-12 days follow dressing instructions for group we left great toe   Discharge Diagnoses: Principal Problem:   2nd degree AV block Active Problems:   Cellulitis   Generalized weakness   Normocytic anemia   Lung nodule   COPD (chronic obstructive pulmonary disease) with emphysema (HCC)   Chronic respiratory failure with hypoxia (HCC)   Acute on chronic heart failure with preserved ejection fraction (HFpEF) (HCC)   S/P TAVR (transcatheter aortic valve replacement)   Left hallux osteomyelitis (HCC)   Pulmonary hypertension, unspecified (HCC)  [name] [name] is a 89 y.o. male with medical history significant of chronic hypoxic respiratory failure on 5 L secondary to COPD, AV heart block, OSA on CPAP, severe aortic stenosis s/p TAVR, PUD, prediabetes, HFpEF, who presents to the ED due to generalized weakness and a wound.   Additionally, had a small wound in the lateral aspect of the right leg, with increasing erythema.  Also with left great toe wound.  In the emergency department, patient was found to have 2:1 AV block.  Cardiology was consulted   Second-degree AV block Acute on Chronic CHF ,diastolic with severe PHT - [facility] consulted--Dr Ends input appreciated -Avoid AV nodal blocking agents, currently no indication for pacemaker placement --IV lasix--with good UOP --increased lasix to 40 mg bid today due to higher pulmonary pressures per Dr Mariah Milling --creat stable --d/w dr Bing Plume will be monitored as out pt and if stable>10.0 then consider low dose eliquis or xarelto with h/o PHT --pt received IV lasix 3 extra doses and diuresed well --he will d/c on po torsemide 40 mg +kcl --f/u  [facility] cardiology   Left great toe wound Cellulitis of right lateral thigh -Continue vancomycin, ceftriaxone for now--wound looks dry--change to po abxs -Wound RN  -MRI to rule out osteo -- Suspected soft tissue ulceration over the dorsal aspect of the interphalangeal joint of the great toe with underlying marrow changes in the head of the 1st proximal phalanx suspicious for early osteomyelitis. Correlate clinically. 2. No evidence of septic arthritis or soft tissue abscess. 3. Generalized forefoot muscular atrophy. --Podaitry consult with Dr Annamary Rummage vitiated. Does not recommend any surgery at present given overall chronic health condition. Patient's family also does not want any surgery. -- Per podiatry Wound care: Recommend 2-3x weekly home dressing change - will need home health RN for this.  - For wound care I recommend mupirocin ointment applied to the wound, cover with silver alginate dressing and wrap with small amount of in guaze roll secure with tape - WB status: WBAT to LLE in post op shoe - ordered   Symptomatic anemia -Baseline hemoglobin between 9 and 10 no reported history of bleeding -Iron panel consistent with anemia of chronic disease /IDA -- patient's hemoglobin down to 7.8. Given significant cardiac history discussed with cardiology and will transfused one unit blood transfusion. Consent from daughter and wife obtained. Will also give IV iron given significant low serum iron. -- Hemoglobin 8.2. Received one unit blood transfusion --d/w dr Bing Plume will be monitored as out pt and if stable>10.0 then consider low dose eliquis or xarelto with h/o PHT   Lung nodule -Has been enlarging since previous  imaging in 2016.  Had previously declined repeat imaging or pursuing any sort of treatment   COPD with chronic hypoxic, hypercapnic respiratory failure  - ABG to see CO2 retention --> CO2 101-->66 today  -- cont BiPAP for hypercapnic resp failure -- change to auto CPAP for  home per RT recs--TOC made aware to inform [facility] --avoid sedating meds --pt uses 5L [place] oxygen chronically at home   Hypothyroidism -Synthroid    Status post TAVR -Aspirin    Morbid obesity with bedbound status. Chronic   palliative care consultation appreciated. Patient has multiple comorbidities.  Family agreeable with plan.      Pain control - [employer] Controlled Substance Reporting System database was reviewed. and patient was instructed, not to drive, operate heavy machinery, perform activities at heights, swimming or participation in water activities or provide baby-sitting services while on Pain, Sleep and Anxiety Medications; until their outpatient Physician has advised to do so again. Also recommended to not to take more than prescribed Pain, Sleep and Anxiety Medications.  Consultants: [facility] cardiologyDisposition: Home health Diet recommendation:  Discharge Diet Orders (From admission, onward)     Start     Ordered   02/19/23 0000  Diet - low sodium heart healthy        02/19/23 0953           Cardiac diet DISCHARGE MEDICATION: Allergies as of 02/19/2023       Reactions   Iohexol Hives, Swelling   Pt reports swelling, redness, hives, and blisters    Atorvastatin    REACTION: aches   Celecoxib    REACTION: rash   Clinoril [sulindac]    REACTION: rash   Nitrofurantoin    Spiriva Handihaler [tiotropium Bromide Monohydrate] Other (See Comments)   Dry mouth   Sulfa Antibiotics    Intolerant but unrecalled.         Medication List     STOP taking these medications    furosemide 40 MG tablet Commonly known as: LASIX       TAKE these medications    albuterol 108 (90 Base) MCG/ACT inhaler Commonly known as: ProAir HFA INHALE 1-2 PUFFS INTO THE LUNGS EVERY 6 (SIX) HOURS AS NEEDED FOR WHEEZING.   aspirin EC 81 MG tablet Take 1 tablet (81 mg total) by mouth daily.   doxycycline 100 MG tablet Commonly known as: VIBRA-TABS Take 1 tablet  (100 mg total) by mouth every 12 (twelve) hours for 8 days.   Flutter Devi Use as directed   gabapentin 100 MG capsule Commonly known as: NEURONTIN TAKE 1-2 CAPSULES (100-200 MG TOTAL) BY MOUTH 3 (THREE) TIMES DAILY AS NEEDED (FOR PAIN).   levothyroxine 25 MCG tablet Commonly known as: SYNTHROID Take 1 tablet (25 mcg total) by mouth daily.   loperamide 2 MG tablet Commonly known as: Imodium A-D Take 1 tablet (2 mg total) by mouth 4 (four) times daily as needed for diarrhea or loose stools.   loratadine 10 MG tablet Commonly known as: CLARITIN Take 1 tablet (10 mg total) by mouth daily.   LORazepam 1 MG tablet Commonly known as: ATIVAN TAKE 1/4 TO 1/2 TABLET BY MOUTH EVERY 6 HOURS AS NEEDED   neomycin-bacitracin-polymyxin [phone] Oint Apply 1 Application topically daily.   OXYGEN Inhale into the lungs. 5 Liters   polyethylene glycol 17 g packet Commonly known as: MIRALAX / GLYCOLAX Take 17 g by mouth daily as needed for mild constipation.   potassium chloride SA 20 MEQ tablet Commonly known  asJerene Dilling Take 1 tablet (20 mEq total) by mouth daily. Start taking on: February 20, 2023   Symbicort 160-4.5 MCG/ACT inhaler Generic drug: budesonide-formoterol TAKE 2 PUFFS BY MOUTH TWICE A DAY   Torsemide 40 MG Tabs Take 40 mg by mouth daily. Start taking on: February 20, 2023   triamcinolone cream 0.5 % Commonly known as: KENALOG Apply 1 Application topically 2 (two) times daily as needed.   Vitamin D (Ergocalciferol) 1.25 MG (50000 UNIT) Caps capsule Commonly known as: DRISDOL Take 1 capsule by mouth once weekly for 12 weeks.               Durable Medical Equipment  (From admission, onward)           Start     Ordered   02/19/23 1039  For home use only DME continuous positive airway pressure (CPAP)  Once       Comments: CPAP positive pressure of 14. Use during naps and bedtime  Question Answer Comment  Length of Need Lifetime   Patient has  OSA or probable OSA Yes   Is the patient currently using CPAP in the home Yes   Settings Other see comments   CPAP supplies needed Mask, headgear, cushions, filters, heated tubing and water chamber      02/19/23 1039   02/16/23 1418  For home use only DME Other see comment  Once       Comments: Michiel Sites lift  Question:  Length of Need  Answer:  Lifetime   02/16/23 1418              Discharge Care Instructions  (From admission, onward)           Start     Ordered   02/19/23 0000  Discharge wound care:       Comments: 02/17/23 1000    Wound care  Every 3 days     Comments: 2-3x weekly wound care and dressing changes for Left hallux ulcer. Apply mupirocin ointment, cover with silver alginate then wrap toe with 1 in gauze roll secure with tape.  02/16/23 1721    02/16/23 0803    Wound care  Every other day    Comments: Right thigh Cleanse with Vashe solution [place] # [phone]), cut to fit silver hydrofiber (Aquacel [employer]+-[employer] # [id number]) and place in wound bed, top with foam Change every other day  02/16/23 0802   02/19/23 0277            Follow-up Information     Joaquim Nam, MD. Schedule an appointment as soon as possible for a visit in 1 week(s).   Specialty: Family Medicine Why: hospital f/u Contact information: [address] [place] [address] [phone]         Antonieta Iba, MD. Schedule an appointment as soon as possible for a visit in 1 week(s).   Specialty: Cardiology Why: CHF, PHT f/u Contact information: [address] [place] [address] [phone]         Pilar Plate, DPM. Schedule an appointment as soon as possible for a visit in 2 week(s).   Specialty: Podiatry Why: left great toe ulcer f/u Contact information: [address] [place] [address] [phone]                Discharge Exam: Ceasar Mons Weights   02/14/23 1306 02/15/23 1809  Weight: 99.8 kg  96.1 kg   GENERAL:  89 y.o.-year-old patient with  acute distress. Morbidly obese LUNGS: decreased breath sounds bilaterally, no wheezing CARDIOVASCULAR: S1, S2 normal. No murmur   ABDOMEN: Soft, nontender, nondistended. EXTREMITIES: trace  edema b/l.    NEUROLOGIC: nonfocal  patient is alert and ox3 SKIN:  Pressure Injury 05/31/22 Sacrum Stage 2 -  Partial thickness loss of dermis presenting as a shallow open injury with a red, pink wound bed without slough. (Active)  05/31/22 2000  Location: Sacrum  Location Orientation:   Staging: Stage 2 -  Partial thickness loss of dermis presenting as a shallow open injury with a red, pink wound bed without slough.  Wound Description (Comments):   Present on Admission: Yes     Pressure Injury 06/12/22 Buttocks Left Stage 2 -  Partial thickness loss of dermis presenting as a shallow open injury with a red, pink wound bed without slough. (Active)  06/12/22 1824  Location: Buttocks  Location Orientation: Left  Staging: Stage 2 -  Partial thickness loss of dermis presenting as a shallow open injury with a red, pink wound bed without slough.  Wound Description (Comments):   Present on Admission: Yes     Pressure Injury 02/15/23 Thigh Anterior;Right Unstageable - Full thickness tissue loss in which the base of the injury is covered by slough (yellow, tan, gray, green or brown) and/or eschar (tan, brown or black) in the wound bed. WOC 9/2; this is not pre (Active)  02/15/23 1835  Location: Thigh  Location Orientation: Anterior;Right  Staging: Unstageable - Full thickness tissue loss in which the base of the injury is covered by slough (yellow, tan, gray, green or brown) and/or eschar (tan, brown or black) in the wound bed.  Wound Description (Comments): WOC 9/2; this is not pressure injury; infectious in nature  Present on Admission: Yes    Condition at discharge: fair  The results of significant diagnostics from this hospitalization (including  imaging, microbiology, ancillary and laboratory) are listed below for reference.   Imaging Studies: DG Chest Port 1 View  Result Date: 02/16/2023 CLINICAL DATA:  Shortness of breath. EXAM: PORTABLE CHEST 1 VIEW COMPARISON:  Radiograph and CT 06/10/2022 FINDINGS: Mild cardiomegaly. Unchanged mediastinal contours. TAVR. Retrocardiac opacity likely combination of airspace disease and small pleural effusion. Right upper lobe mass is faintly visualized by radiograph. No pneumothorax. IMPRESSION: 1. Retrocardiac opacity likely combination of airspace disease and small pleural effusion. 2. Mild cardiomegaly. 3. Right upper lobe mass is faintly visualized by radiograph. Electronically Signed   By: Narda Rutherford M.D.   On: 02/16/2023 15:11   MR FOOT LEFT W WO CONTRAST  Result Date: 02/16/2023 CLINICAL DATA:  Great toe infection. EXAM: MRI OF THE LEFT FOREFOOT WITHOUT AND WITH CONTRAST TECHNIQUE: Multiplanar, multisequence MR imaging of the left forefoot was performed both before and after administration of intravenous contrast. CONTRAST:  10mL GADAVIST GADOBUTROL 1 MMOL/ML IV SOLN COMPARISON:  None Available. FINDINGS: Bones/Joint/Cartilage Suggested skin ulceration over the dorsal aspect of the interphalangeal joint of the great toe. There is underlying marrow T2 hyperintensity and low level enhancement within the head of the 1st proximal phalanx. Questionable mildly decreased T1 marrow signal without gross cortical destruction. The distal 1st phalanx in the 1st metatarsal demonstrate no significant findings. No significant joint effusion or abnormal synovial enhancement. The additional toes and metatarsals appear unremarkable. No significant abnormality of the Lisfranc joint or within the visualized tarsal bones. Ligaments Intact Lisfranc joint. Intact collateral ligaments of the metatarsophalangeal joints. Muscles and Tendons Generalized forefoot muscular atrophy  without focal fluid collection or abnormal  enhancement. The forefoot tendons appear intact, without significant tenosynovitis. Soft tissues As above, suspected soft tissue ulceration dorsal to the interphalangeal joint of the great toe with associated soft tissue enhancement in this area. No focal fluid collection or foreign body identified. No other focal soft tissue abnormalities or abnormal enhancement identified. IMPRESSION: 1. Suspected soft tissue ulceration over the dorsal aspect of the interphalangeal joint of the great toe with underlying marrow changes in the head of the 1st proximal phalanx suspicious for early osteomyelitis. Correlate clinically. 2. No evidence of septic arthritis or soft tissue abscess. 3. Generalized forefoot muscular atrophy. Electronically Signed   By: Carey Bullocks M.D.   On: 02/16/2023 08:12   ECHOCARDIOGRAM COMPLETE  Result Date: 02/15/2023    ECHOCARDIOGRAM REPORT   Patient Name:   ASAHD LEISINGER Date of Exam: 02/15/2023 Medical Rec #:  [id number]    Height:       70.0 in Accession #:    [id number]   Weight:       220.0 lb Date of Birth:  August 25, 1933    BSA:          2.174 m Patient Age:    89 years     BP:           151/53 mmHg Patient Gender: M            HR:           49 bpm. Exam Location:  [facility] Procedure: 2D Echo, Cardiac Doppler and Color Doppler Indications:     Heart block, 2nd degree I44.1  History:         Patient has prior history of Echocardiogram examinations.                  Signs/Symptoms:Shortness of Breath.  Sonographer:     Neysa Bonito Roar Referring Phys:  [id number] SHERI HAMMOCK Diagnosing Phys: Chilton Si MD IMPRESSIONS  1. Left ventricular ejection fraction, by estimation, is 60 to 65%. The left ventricle has normal function. The left ventricle has no regional wall motion abnormalities. There is mild left ventricular hypertrophy. Left ventricular diastolic parameters are consistent with Grade I diastolic dysfunction (impaired relaxation). Elevated left ventricular end-diastolic pressure.  2. Right  ventricular systolic function is normal. The right ventricular size is normal. There is severely elevated pulmonary artery systolic pressure.  3. Left atrial size was severely dilated.  4. Right atrial size was moderately dilated.  5. The mitral valve is normal in structure. Mild mitral valve regurgitation. Moderate mitral stenosis. The mean mitral valve gradient is 10.0 mmHg. Severe mitral annular calcification.  6. The aortic valve is normal in structure. Aortic valve regurgitation is not visualized. Moderate aortic valve stenosis. Aortic valve area, by VTI measures 1.53 cm. Aortic valve mean gradient measures 25.0 mmHg. Aortic valve Vmax measures 3.66 m/s.  7. The inferior vena cava is dilated in size with <50% respiratory variability, suggesting right atrial pressure of 15 mmHg. FINDINGS  Left Ventricle: Left ventricular ejection fraction, by estimation, is 60 to 65%. The left ventricle has normal function. The left ventricle has no regional wall motion abnormalities. The left ventricular internal cavity size was normal in size. There is  mild left ventricular hypertrophy. Left ventricular diastolic parameters are consistent with Grade I diastolic dysfunction (impaired relaxation). Elevated left ventricular end-diastolic pressure. Right Ventricle: The right ventricular size is normal. No increase in right ventricular wall thickness. Right ventricular systolic function is normal. There is  severely elevated pulmonary artery systolic pressure. The tricuspid regurgitant velocity is 3.74 m/s, and with an assumed right atrial pressure of 15 mmHg, the estimated right ventricular systolic pressure is 71.0 mmHg. Left Atrium: Left atrial size was severely dilated. Right Atrium: Right atrial size was moderately dilated. Pericardium: There is no evidence of pericardial effusion. Mitral Valve: The mitral valve is normal in structure. There is moderate thickening of the mitral valve leaflet(s). There is moderate  calcification of the mitral valve leaflet(s). Severe mitral annular calcification. Mild mitral valve regurgitation. Moderate mitral valve stenosis. MV peak gradient, 23.8 mmHg. The mean mitral valve gradient is 10.0 mmHg. Tricuspid Valve: The tricuspid valve is normal in structure. Tricuspid valve regurgitation is mild . No evidence of tricuspid stenosis. Aortic Valve: The aortic valve is normal in structure. Aortic valve regurgitation is not visualized. Moderate aortic stenosis is present. Aortic valve mean gradient measures 25.0 mmHg. Aortic valve peak gradient measures 53.6 mmHg. Aortic valve area, by VTI measures 1.53 cm. Pulmonic Valve: The pulmonic valve was normal in structure. Pulmonic valve regurgitation is trivial. No evidence of pulmonic stenosis. Aorta: The aortic root is normal in size and structure. Venous: The inferior vena cava is dilated in size with less than 50% respiratory variability, suggesting right atrial pressure of 15 mmHg. IAS/Shunts: No atrial level shunt detected by color flow Doppler.  LEFT VENTRICLE PLAX 2D LVIDd:         5.80 cm   Diastology LVIDs:         4.20 cm   LV e' medial:    7.72 cm/s LV PW:         1.00 cm   LV E/e' medial:  20.7 LV IVS:        1.10 cm   LV e' lateral:   5.98 cm/s LVOT diam:     1.80 cm   LV E/e' lateral: 26.8 LV SV:         110 LV SV Index:   50 LVOT Area:     2.54 cm  RIGHT VENTRICLE RV Basal diam:  3.80 cm RV Mid diam:    3.50 cm RV S prime:     21.30 cm/s TAPSE (M-mode): 3.3 cm LEFT ATRIUM              Index        RIGHT ATRIUM           Index LA diam:        4.50 cm  2.07 cm/m   RA Area:     23.50 cm LA Vol (A2C):   118.0 ml 54.29 ml/m  RA Volume:   74.10 ml  34.09 ml/m LA Vol (A4C):   91.0 ml  41.86 ml/m LA Biplane Vol: 109.0 ml 50.15 ml/m  AORTIC VALVE                     PULMONIC VALVE AV Area (Vmax):    1.31 cm      PV Vmax:          1.67 m/s AV Area (Vmean):   1.56 cm      PV Peak grad:     11.2 mmHg AV Area (VTI):     1.53 cm      PR End  Diast Vel: 6.97 msec AV Vmax:           366.00 cm/s   RVOT Peak grad:   6 mmHg AV Vmean:  203.667 cm/s AV VTI:            0.716 m AV Peak Grad:      53.6 mmHg AV Mean Grad:      25.0 mmHg LVOT Vmax:         189.00 cm/s LVOT Vmean:        125.000 cm/s LVOT VTI:          0.431 m LVOT/AV VTI ratio: 0.60  AORTA Ao Root diam: 2.30 cm MITRAL VALVE                TRICUSPID VALVE MV Area (PHT): 6.54 cm     TR Peak grad:   56.0 mmHg MV Area VTI:   2.60 cm     TR Vmax:        374.00 cm/s MV Peak grad:  23.8 mmHg MV Mean grad:  10.0 mmHg    SHUNTS MV Vmax:       2.44 m/s     Systemic VTI:  0.43 m MV Vmean:      149.0 cm/s   Systemic Diam: 1.80 cm MV Decel Time: 116 msec MV E velocity: 160.00 cm/s MV A velocity: 189.00 cm/s MV E/A ratio:  0.85 MV A Prime:    15.4 cm/s Chilton Si MD Electronically signed by Chilton Si MD Signature Date/Time: 02/15/2023/2:55:52 PM    Final     Microbiology: Results for orders placed or performed during the hospital encounter of 02/14/23  Resp panel by RT-PCR (RSV, Flu A&B, Covid) Anterior Nasal Swab     Status: None   Collection Time: 02/14/23  1:45 PM   Specimen: Anterior Nasal Swab  Result Value Ref Range Status   SARS Coronavirus 2 by RT PCR NEGATIVE NEGATIVE Final    Comment: (NOTE) SARS-CoV-2 target nucleic acids are NOT DETECTED.  The SARS-CoV-2 RNA is generally detectable in upper respiratory specimens during the acute phase of infection. The lowest concentration of SARS-CoV-2 viral copies this assay can detect is 138 copies/mL. A negative result does not preclude SARS-Cov-2 infection and should not be used as the sole basis for treatment or other patient management decisions. A negative result may occur with  improper specimen collection/handling, submission of specimen other than nasopharyngeal swab, presence of viral mutation(s) within the areas targeted by this assay, and inadequate number of viral copies(<138 copies/mL). A negative result must  be combined with clinical observations, patient history, and epidemiological information. The expected result is Negative.  Fact Sheet for Patients:  BloggerCourse.com  Fact Sheet for Healthcare Providers:  SeriousBroker.it  This test is no t yet approved or cleared by the [place] FDA and  has been authorized for detection and/or diagnosis of SARS-CoV-2 by FDA under an Emergency Use Authorization (EUA). This EUA will remain  in effect (meaning this test can be used) for the duration of the COVID-19 declaration under Section 564(b)(1) of the Act, 21 U.S.C.section 360bbb-3(b)(1), unless the authorization is terminated  or revoked sooner.       Influenza A by PCR NEGATIVE NEGATIVE Final   Influenza B by PCR NEGATIVE NEGATIVE Final    Comment: (NOTE) The Xpert Xpress SARS-CoV-2/FLU/RSV plus assay is intended as an aid in the diagnosis of influenza from Nasopharyngeal swab specimens and should not be used as a sole basis for treatment. Nasal washings and aspirates are unacceptable for Xpert Xpress SARS-CoV-2/FLU/RSV testing.  Fact Sheet for Patients: BloggerCourse.com  Fact Sheet for Healthcare Providers: SeriousBroker.it  This test is not yet approved  or cleared by the [place] and has been authorized for detection and/or diagnosis of SARS-CoV-2 by FDA under an Emergency Use Authorization (EUA). This EUA will remain in effect (meaning this test can be used) for the duration of the COVID-19 declaration under Section 564(b)(1) of the Act, 21 U.S.C. section 360bbb-3(b)(1), unless the authorization is terminated or revoked.     Resp Syncytial Virus by PCR NEGATIVE NEGATIVE Final    Comment: (NOTE) Fact Sheet for Patients: BloggerCourse.com  Fact Sheet for Healthcare Providers: SeriousBroker.it  This test is not  yet approved or cleared by the [place] FDA and has been authorized for detection and/or diagnosis of SARS-CoV-2 by FDA under an Emergency Use Authorization (EUA). This EUA will remain in effect (meaning this test can be used) for the duration of the COVID-19 declaration under Section 564(b)(1) of the Act, 21 U.S.C. section 360bbb-3(b)(1), unless the authorization is terminated or revoked.  Performed at [facility], [address] [place], [place] [address]   Blood Culture (routine x 2)     Status: None   Collection Time: 02/14/23  1:45 PM   Specimen: BLOOD  Result Value Ref Range Status   Specimen Description BLOOD LEFT ANTECUBITAL  Final   Special Requests   Final    BOTTLES DRAWN AEROBIC AND ANAEROBIC BACTEROIDES CACCAE   Culture   Final    NO GROWTH 5 DAYS Performed at [facility], [address] [place], [place] [address]    Report Status 02/19/2023 FINAL  Final  Blood Culture (routine x 2)     Status: None   Collection Time: 02/14/23  1:46 PM   Specimen: BLOOD  Result Value Ref Range Status   Specimen Description BLOOD RIGHT ANTECUBITAL  Final   Special Requests NONE  Final   Culture   Final    NO GROWTH 5 DAYS Performed at [facility], [address] [place], [place] [address]    Report Status 02/19/2023 FINAL  Final  Aerobic Culture w Gram Stain (superficial specimen)     Status: None (Preliminary result)   Collection Time: 02/16/23  5:39 PM   Specimen: Toe; Wound  Result Value Ref Range Status   Specimen Description   Final    TOE Performed at [facility], [address] [place], [place] [address]    Special Requests   Final    NONE Performed at [facility], [address] [place], [place] [address]    Gram Stain NO WBC SEEN NO ORGANISMS SEEN   Final   Culture   Final    RARE DIPHTHEROIDS(CORYNEBACTERIUM SPECIES) IDENTIFICATION TO FOLLOW Performed at [facility], [address],  [place], [place] [address]    Report Status PENDING  Incomplete    Labs: CBC: Recent Labs  Lab 02/14/23 1345 02/15/23 0640 02/16/23 0447 02/17/23 0344  WBC 9.6 9.3 9.9 11.4*  NEUTROABS 7.0 6.6  --   --   HGB 7.6* 7.3* 7.8* 8.2*  HCT 27.3* 25.8* 27.4* 27.8*  MCV 106.6* 105.3* 105.8* 98.6  PLT 268 255 280 305   Basic Metabolic Panel: Recent Labs  Lab 02/14/23 1345 02/15/23 0640 02/17/23 0344 02/18/23 1015  NA 140 140 141 138  K 4.3 4.2 3.6 3.8  CL 91* 95* 93* 89*  CO2 38* 37* 38* 40*  GLUCOSE 91 76 91 133*  BUN 32* 28* 24* 21  CREATININE 1.14 1.04 1.04 0.92  CALCIUM 8.2* 8.1* 8.1* 8.0*  Liver Function Tests: Recent Labs  Lab 02/14/23 1345  AST 20  ALT 13  ALKPHOS 127*  BILITOT 0.6  PROT 6.6  ALBUMIN 2.5*   CBG: Recent Labs  Lab 02/16/23 1233  GLUCAP 89    Discharge time spent: greater than 30 minutes.  Signed: Enedina Finner, MD [facility] 02/19/2023

## 2023-02-19 NOTE — Progress Notes (Signed)
Cardiology Progress Note   Patient Name: [name] [name] Date of Encounter: 02/19/2023  Primary Cardiologist: Yvonne Kendall, MD  Subjective   Reports feeling well this morning, laying supine, denies significant shortness of breath Remains on nasal cannula oxygen No family at the bedside Reports he was unable to sit on the side of the bed yesterday for meals Interested in doing so today Received iron transfusion yesterday  Treated aggressively with IV Lasix every 8 hours yesterday, 2 L negative  Inpatient Medications    Scheduled Meds:  aspirin EC  81 mg Oral Daily   doxycycline  100 mg Oral Q12H   enoxaparin (LOVENOX) injection  50 mg Subcutaneous Q24H   levothyroxine  25 mcg Oral Q0600   mometasone-formoterol  2 puff Inhalation BID   mupirocin cream   Topical BID   [START ON 02/20/2023] potassium chloride  20 mEq Oral Daily   sodium chloride flush  3 mL Intravenous Q12H   [START ON 02/20/2023] torsemide  40 mg Oral Daily   Continuous Infusions:   PRN Meds: acetaminophen **OR** acetaminophen, albuterol, gabapentin, naphazoline-glycerin, ondansetron **OR** ondansetron (ZOFRAN) IV, polyethylene glycol   Vital Signs    Vitals:   02/19/23 0423 02/19/23 0458 02/19/23 0753 02/19/23 1159  BP: (!) 125/53  111/65 (!) 98/49  Pulse: 60  79   Resp: 20  16 18   Temp: 99 F (37.2 C)  99 F (37.2 C) 98.9 F (37.2 C)  TempSrc: Axillary  Oral Oral  SpO2:  97% 99% 95%  Weight:      Height:        Intake/Output Summary (Last 24 hours) at 02/19/2023 1335 Last data filed at 02/19/2023 0423 Gross per 24 hour  Intake 360 ml  Output 1500 ml  Net -1140 ml   Filed Weights   02/14/23 1306 02/15/23 1809  Weight: 99.8 kg 96.1 kg    Physical Exam   Constitutional:  oriented to person, place, and time. No distress.  HENT:  Head: Grossly normal Eyes:  no discharge. No scleral icterus.  Neck: No JVD, no carotid bruits  Cardiovascular: Regular rate and rhythm, no murmurs  appreciated Pulmonary/Chest: Clear to auscultation bilaterally, no wheezes or rails Abdominal: Soft.  no distension.  no tenderness.  Musculoskeletal: Normal range of motion, muscle atrophy in legs noted Neurological:  normal muscle tone. Coordination normal. No atrophy Skin: Skin warm and dry Psychiatric: normal affect, pleasant   Labs    Chemistry Recent Labs  Lab 02/14/23 1345 02/15/23 0640 02/17/23 0344 02/18/23 1015  NA 140 140 141 138  K 4.3 4.2 3.6 3.8  CL 91* 95* 93* 89*  CO2 38* 37* 38* 40*  GLUCOSE 91 76 91 133*  BUN 32* 28* 24* 21  CREATININE 1.14 1.04 1.04 0.92  CALCIUM 8.2* 8.1* 8.1* 8.0*  PROT 6.6  --   --   --   ALBUMIN 2.5*  --   --   --   AST 20  --   --   --   ALT 13  --   --   --   ALKPHOS 127*  --   --   --   BILITOT 0.6  --   --   --   GFRNONAA >60 >60 >60 >60  ANIONGAP 11 8 10 9      Hematology Recent Labs  Lab 02/15/23 0640 02/16/23 0447 02/17/23 0344  WBC 9.3 9.9 11.4*  RBC 2.45* 2.59* 2.82*  HGB 7.3* 7.8* 8.2*  HCT 25.8* 27.4*  27.8*  MCV 105.3* 105.8* 98.6  MCH 29.8 30.1 29.1  MCHC 28.3* 28.5* 29.5*  RDW 14.5 14.4 17.2*  PLT 255 280 305    Cardiac Enzymes  Recent Labs  Lab 02/14/23 1345 02/14/23 1533  TROPONINIHS 12 12  Let BNP    Component Value Date/Time   BNP 158.0 (H) 02/14/2023 1345   BNP 18 11/01/2021 1607   Lipids  Lab Results  Component Value Date   CHOL 176 01/16/2021   HDL 53 01/16/2021   LDLCALC 108 (H) 01/16/2021   TRIG 77 01/16/2021   CHOLHDL 3.3 01/16/2021    HbA1c  Lab Results  Component Value Date   HGBA1C 5.3 03/26/2018    Radiology    DG Chest Port 1 View  Result Date: 02/16/2023 CLINICAL DATA:  Shortness of breath. EXAM: PORTABLE CHEST 1 VIEW COMPARISON:  Radiograph and CT 06/10/2022 FINDINGS: Mild cardiomegaly. Unchanged mediastin true al contours. TAVR. Retrocardiac opacity likely combination of airspace disease and small pleural effusion. Right upper lobe mass is faintly visualized by  radiograph. No pneumothorax. IMPRESSION: 1. Retrocardiac opacity likely combination of airspace disease and small pleural effusion. 2. Mild cardiomegaly. 3. Right upper lobe mass is faintly visualized by radiograph. Electronically Signed   By: Narda Rutherford M.D.   On: 02/16/2023 15:11   MR FOOT LEFT W WO CONTRAST  Result Date: 02/16/2023 CLINICAL DATA:  Great toe infection. EXAM: MRI OF THE LEFT FOREFOOT WITHOUT AND WITH CONTRAST TECHNIQUE: Multiplanar, multisequence MR imaging of the left forefoot was performed both before and after administration of intravenous contrast. CONTRAST:  10mL GADAVIST GADOBUTROL 1 MMOL/ML IV SOLN COMPARISON:  None Available. FINDINGS: Bones/Joint/Cartilage Suggested skin ulceration over the dorsal aspect of the interphalangeal joint of the great toe. There is underlying marrow T2 hyperintensity and low level enhancement within the head of the 1st proximal phalanx. Questionable mildly decreased T1 marrow signal without gross cortical destruction. The distal 1st phalanx in the 1st metatarsal demonstrate no significant findings. No significant joint effusion or abnormal synovial enhancement. The additional toes and metatarsals appear unremarkable. No significant abnormality of the Lisfranc joint or within the visualized tarsal bones. Ligaments Intact Lisfranc joint. Intact collateral ligaments of the metatarsophalangeal joints. Muscles and Tendons Generalized forefoot muscular atrophy without focal fluid collection or abnormal enhancement. The forefoot tendons appear intact, without significant tenosynovitis. Soft tissues As above, suspected soft tissue ulceration dorsal to the interphalangeal joint of the great toe with associated soft tissue enhancement in this area. No focal fluid collection or foreign body identified. No other focal soft tissue abnormalities or abnormal enhancement identified. IMPRESSION: 1. Suspected soft tissue ulceration over the dorsal aspect of the  interphalangeal joint of the great toe with underlying marrow changes in the head of the 1st proximal phalanx suspicious for early osteomyelitis. Correlate clinically. 2. No evidence of septic arthritis or soft tissue abscess. 3. Generalized forefoot muscular atrophy. Electronically Signed   By: Carey Bullocks M.D.   On: 02/16/2023 08:12    Telemetry    RSR, intermittent 2:1 AVB w/o sustained pause or bradycaria - Personally Reviewed  Cardiac Studies   2D Echocardiogram 9.1.2024  1. Left ventricular ejection fraction, by estimation, is 60 to 65%. The  left ventricle has normal function. The left ventricle has no regional  wall motion abnormalities. There is mild left ventricular hypertrophy.  Left ventricular diastolic parameters  are consistent with Grade I diastolic dysfunction (impaired relaxation).  Elevated left ventricular end-diastolic pressure.   2. Right ventricular systolic function  is normal. The right ventricular  size is normal. There is severely elevated pulmonary artery systolic  pressure.   3. Left atrial size was severely dilated.   4. Right atrial size was moderately dilated.   5. The mitral valve is normal in structure. Mild mitral valve  regurgitation. Moderate mitral stenosis. The mean mitral valve gradient is  10.0 mmHg. Severe mitral annular calcification.   6. The aortic valve is normal in structure. Aortic valve regurgitation is  not visualized. Moderate aortic valve stenosis. Aortic valve area, by VTI  measures 1.53 cm. Aortic valve mean gradient measures 25.0 mmHg. Aortic  valve Vmax measures 3.66 m/s.   7. The inferior vena cava is dilated in size with <50% respiratory  variability, suggesting right atrial pressure of 15 mmHg.  Patient Profile     89 y.o. 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   Assessment & Plan    1.  2:1 AVB:   Prior h/o intermittent 2:1 AVB in the absence of presyncope/syncope.  Prev eval by EP and not felt to require pacing.   -Telemetry again demonstrating intermittent 2:1 AVB on admission  Isolated dropped beats, no sustained pauses Given bedbound, likely of little clinical significance  2.  Chronic HFpEF/Pulm HTN:   EF 60-65% w/ GrI DD on echo this admission.  Severe pulmonary hypertension noted  On admission, crackles on exam,  IV lasix started.  -Received IV Lasix every 8 hours yesterday, 2 L out IV Lasix this morning Would recommend discharged with torsemide 40 daily potassium 20 daily Moderate fluid intake -Plan to continue IV Lasix, increase up to twice daily dosing -Unable to exclude chronic thromboembolism as cause of his pulmonary hypertension as he is sedentary in bed, relatively immobile.  Not a good candidate for aggressive anticoagulation at this time given anemia -Reevaluation of hemoglobin as outpatient, once hemoglobin 10 or greater could consider reduced dose Xarelto 10 mg daily for DVT prophylaxis as he is very sedentary in bed  3.  RLE cellulitis/L great toe osteomyelitis:   Cont abx per IM/podiatry.  4.  Lung mass, right upper lobe 3.9 cm noted on prior CT scan December 2023 Decided not to pursue aggressive means  5.  Status post TAVR Placed 2016 Mean gradient estimated at 25 mmHg, gradient higher compared to prior study October 2022, 14 mmHg No intervention needed at this time but this may contribute to diastolic CHF symptoms/pulmonary hypertension  6.  Anemia Transfusion September 2 Iron infusion yesterday, will continue to follow  7.  Immobility Family reports that he seemed to give up and is now immobile over the past year Worse after hospitalizations end of 2023, sepsis with E. Coli  8. AAA,  Estimated 5 cm Not a good candidate for intervention at this time    Total encounter time more than 50 minutes  Greater than 50% was spent in counseling and coordination of care with the patient   Signed, Dossie Arbour, MD, Ph.D [facility]  For questions or updates, please contact   Please consult [email or web address] for contact info under Cardiology/STEMI.

## 2023-02-19 NOTE — TOC Transition Note (Addendum)
Transition of Care [facility]) - CM/SW Discharge Note   Patient Details  Name: [name] [name] MRN: [id number] Date of Birth: 03-17-1934  Transition of Care [facility]) CM/SW Contact:  Truddie Hidden, RN Phone Number: 02/19/2023, 11:23 AM   Clinical Narrative:    Spoke with patient and his family at the bedside regarding his discharge home today. Patient will need EMS transport home has elected to be a full DNR for EMS transport. His wife and daughter will be there to let him in. Coralee North from [facility] notified of discharge. Nurse notified EMS packet prepared and EMS has been arranged.  TOC signing off.          Patient Goals and CMS Choice      Discharge Placement                         Discharge Plan and Services Additional resources added to the After Visit Summary for                                       Social Determinants of Health (SDOH) Interventions SDOH Screenings   Food Insecurity: No Food Insecurity (02/15/2023)  Housing: Low Risk  (02/15/2023)  Transportation Needs: No Transportation Needs (02/15/2023)  Recent Concern: Transportation Needs - Unmet Transportation Needs (12/30/2022)  Utilities: Not At Risk (02/15/2023)  Alcohol Screen: Low Risk  (12/16/2021)  Depression (PHQ2-9): Low Risk  (12/30/2022)  Financial Resource Strain: Low Risk  (12/30/2022)  Physical Activity: Inactive (12/30/2022)  Social Connections: Moderately Integrated (12/30/2022)  Stress: No Stress Concern Present (12/30/2022)  Tobacco Use: Medium Risk (02/14/2023)  Health Literacy: Adequate Health Literacy (12/30/2022)     Readmission Risk Interventions     No data to display

## 2023-02-20 DIAGNOSIS — J449 Chronic obstructive pulmonary disease, unspecified: Secondary | ICD-10-CM | POA: Diagnosis not present

## 2023-02-20 DIAGNOSIS — R531 Weakness: Secondary | ICD-10-CM | POA: Diagnosis not present

## 2023-02-20 LAB — AEROBIC CULTURE W GRAM STAIN (SUPERFICIAL SPECIMEN): Gram Stain: NONE SEEN

## 2023-02-21 DIAGNOSIS — L03116 Cellulitis of left lower limb: Secondary | ICD-10-CM | POA: Diagnosis not present

## 2023-02-21 DIAGNOSIS — S91102D Unspecified open wound of left great toe without damage to nail, subsequent encounter: Secondary | ICD-10-CM | POA: Diagnosis not present

## 2023-02-21 DIAGNOSIS — J9612 Chronic respiratory failure with hypercapnia: Secondary | ICD-10-CM | POA: Diagnosis not present

## 2023-02-21 DIAGNOSIS — I5033 Acute on chronic diastolic (congestive) heart failure: Secondary | ICD-10-CM | POA: Diagnosis not present

## 2023-02-21 DIAGNOSIS — I441 Atrioventricular block, second degree: Secondary | ICD-10-CM | POA: Diagnosis not present

## 2023-02-21 DIAGNOSIS — I509 Heart failure, unspecified: Secondary | ICD-10-CM | POA: Diagnosis not present

## 2023-02-21 DIAGNOSIS — J449 Chronic obstructive pulmonary disease, unspecified: Secondary | ICD-10-CM | POA: Diagnosis not present

## 2023-02-21 DIAGNOSIS — I272 Pulmonary hypertension, unspecified: Secondary | ICD-10-CM | POA: Diagnosis not present

## 2023-02-21 DIAGNOSIS — L03115 Cellulitis of right lower limb: Secondary | ICD-10-CM | POA: Diagnosis not present

## 2023-02-21 DIAGNOSIS — Z9981 Dependence on supplemental oxygen: Secondary | ICD-10-CM | POA: Diagnosis not present

## 2023-02-21 DIAGNOSIS — J9611 Chronic respiratory failure with hypoxia: Secondary | ICD-10-CM | POA: Diagnosis not present

## 2023-02-21 DIAGNOSIS — M86672 Other chronic osteomyelitis, left ankle and foot: Secondary | ICD-10-CM | POA: Diagnosis not present

## 2023-02-21 DIAGNOSIS — D649 Anemia, unspecified: Secondary | ICD-10-CM | POA: Diagnosis not present

## 2023-02-22 DIAGNOSIS — I509 Heart failure, unspecified: Secondary | ICD-10-CM | POA: Diagnosis not present

## 2023-02-22 DIAGNOSIS — J9610 Chronic respiratory failure, unspecified whether with hypoxia or hypercapnia: Secondary | ICD-10-CM | POA: Diagnosis not present

## 2023-02-22 DIAGNOSIS — J449 Chronic obstructive pulmonary disease, unspecified: Secondary | ICD-10-CM | POA: Diagnosis not present

## 2023-02-22 DIAGNOSIS — R300 Dysuria: Secondary | ICD-10-CM | POA: Diagnosis not present

## 2023-02-22 DIAGNOSIS — N390 Urinary tract infection, site not specified: Secondary | ICD-10-CM | POA: Diagnosis not present

## 2023-02-23 ENCOUNTER — Telehealth: Payer: Self-pay | Admitting: Family Medicine

## 2023-02-23 ENCOUNTER — Telehealth: Payer: Self-pay

## 2023-02-23 DIAGNOSIS — G4733 Obstructive sleep apnea (adult) (pediatric): Secondary | ICD-10-CM | POA: Diagnosis not present

## 2023-02-23 NOTE — Telephone Encounter (Signed)
-----   Message from Nurse Marchelle Folks R sent at 02/23/2023 11:59 AM EDT ----- Good afternoon, I wanted to let you know that patient was discharged home from hospital with home health nursing orders for wound care. [facility] can not staff this request.  I have offered to help family with getting another home health agency involved so patient could get wound care and wife does not want to lose the PT with [facility].  At this time the daughter will do wound care. I have requested that patient be added to [facility] work load when nurse is available.   This patient has a virtual visit with you on Friday as he is bed bound.   Rowe Pavy, RN, BSN, CEN [facility] [phone]

## 2023-02-23 NOTE — Group Note (Deleted)

## 2023-02-23 NOTE — Transitions of Care (Post Inpatient/ED Visit) (Signed)
02/23/2023  Name: [name] [name] MRN: [id number] DOB: 02-Apr-1934  Today's TOC FU Call Status: Today's TOC FU Call Status:: Successful TOC FU Call Completed TOC FU Call Complete Date: 02/23/23 Patient's Name and Date of Birth confirmed.  Transition Care Management Follow-up Telephone Call Date of Discharge: 02/19/23 Discharge Facility: [facility]) Type of Discharge: Inpatient Admission Primary Inpatient Discharge Diagnosis:: weakness How have you been since you were released from the hospital?: Same Any questions or concerns?: Yes Patient Questions/Concerns:: UTI symptoms.  [name] [name] came out and did a urine test and culture pending. No nurse available with [facility].  PT with [facility].  [facility] told patient and family they had to do their own dressing change. Patient Questions/Concerns Addressed: Other: No nursing care available with [facility]. Spoke with [employer] Civil engineer, contracting with [phone]).  Reviewed with daughter and patients wife would prefer to keep current PT with [facility] and hold on nursing wound care.  Reviewed concern with daughter and daughter is going to do the dressing changes.  Reviewed all dressing change orders and daughter states that she understands how to do dressing changes to both the toe and the thigh.  Items Reviewed: Did you receive and understand the discharge instructions provided?: Yes Medications obtained,verified, and reconciled?: Yes (Medications Reviewed) Any new allergies since your discharge?: No Dietary orders reviewed?: Yes Type of Diet Ordered:: low salt heart healthy Do you have support at home?: Yes People in Home: spouse Name of Support/Comfort Primary Source: [name] [name]  Medications Reviewed Today: Medications Reviewed Today     Reviewed by [name] Server, RN (Registered Nurse) on 02/23/23 at 0930  Med List Status: <None>   Medication Order Taking? Sig Documenting Provider Last Dose Status Informant  albuterol (PROAIR HFA) 108  (90 Base) MCG/ACT inhaler [id number] No INHALE 1-2 PUFFS INTO THE LUNGS EVERY 6 (SIX) HOURS AS NEEDED FOR WHEEZING. Joaquim Nam, MD unk Active   aspirin EC 81 MG tablet [id number] No Take 1 tablet (81 mg total) by mouth daily. [name] [name], [place] 02/14/2023 Active Self           Med Note Delford Field, Lenny Pastel   Wed May 23, 2015  1:57 PM) .  doxycycline (VIBRA-TABS) 100 MG tablet [id number]  Take 1 tablet (100 mg total) by mouth every 12 (twelve) hours for 8 days. Enedina Finner, MD  Active   gabapentin (NEURONTIN) 100 MG capsule [id number] No TAKE 1-2 CAPSULES (100-200 MG TOTAL) BY MOUTH 3 (THREE) TIMES DAILY AS NEEDED (FOR PAIN). Joaquim Nam, MD 02/14/2023 Active   levothyroxine (SYNTHROID) 25 MCG tablet [id number] No Take 1 tablet (25 mcg total) by mouth daily. Joaquim Nam, MD 02/14/2023 Active   loperamide (IMODIUM A-D) 2 MG tablet [id number] No Take 1 tablet (2 mg total) by mouth 4 (four) times daily as needed for diarrhea or loose stools. Joaquim Nam, MD unk Active   loratadine (CLARITIN) 10 MG tablet [id number] No Take 1 tablet (10 mg total) by mouth daily. Joaquim Nam, MD unk Active   LORazepam (ATIVAN) 1 MG tablet [id number] No TAKE 1/4 TO 1/2 TABLET BY MOUTH EVERY 6 HOURS AS NEEDED Joaquim Nam, MD unk Active   neomycin-bacitracin-polymyxin [phone] OINT [id number] No Apply 1 Application topically daily. Joaquim Nam, MD unk Active   OXYGEN [id number] No Inhale into the lungs. 5 Liters [provider] Taking Active Self  polyethylene glycol (MIRALAX / GLYCOLAX) 17 g packet [id number]  Take 17 g by mouth daily as  needed for mild constipation. Enedina Finner, MD  Active   potassium chloride SA (KLOR-CON M) 20 MEQ tablet [id number]  Take 1 tablet (20 mEq total) by mouth daily. Enedina Finner, MD  Active   Respiratory Therapy Supplies (FLUTTER) DEVI [id number] No Use as directed Clance, Maree Krabbe, MD Taking Active Self  SYMBICORT 160-4.5 MCG/ACT inhaler [id number] No TAKE 2 PUFFS BY  MOUTH TWICE A DAY Joaquim Nam, MD 02/14/2023 Active   torsemide 40 MG TABS [id number]  Take 40 mg by mouth daily. Enedina Finner, MD  Active   triamcinolone cream (KENALOG) 0.5 % [id number] No Apply 1 Application topically 2 (two) times daily as needed. Joaquim Nam, MD unk Active   Vitamin D, Ergocalciferol, (DRISDOL) 1.25 MG (50000 UNIT) CAPS capsule [id number] No Take 1 capsule by mouth once weekly for 12 weeks. Joaquim Nam, MD Past Week Active             Home Care and Equipment/Supplies: Were Home Health Services Ordered?: Yes Name of Home Health Agency:: [phone] Has Agency set up a time to come to your home?: Yes First Home Health Visit Date: 02/21/23 (PT assessment.  NO RN) Any new equipment or medical supplies ordered?: Yes Name of Medical supply agency?: [employer] Were you able to get the equipment/medical supplies?: Yes Do you have any questions related to the use of the equipment/supplies?: No  In basket message sent to MD.  [facility] scheduled virtual hospital discharge visit due to patient being bed bound.  Daughter informed of time and date of MD follow up.  I have spoken with [name] [name] at [facility] and informed her that patient needs to be added to the [facility] case load when they have a nurse available.  I also inquired if [facility] had sub contractor and was told no.    Functional Questionnaire: Do you need assistance with bathing/showering or dressing?: Yes Do you need assistance with meal preparation?: Yes Do you need assistance with eating?: Yes Do you have difficulty maintaining continence: Yes Do you need assistance with getting out of bed/getting out of a chair/moving?: Yes Do you have difficulty managing or taking your medications?: Yes  Follow up appointments reviewed: PCP Follow-up appointment confirmed?: No (Friday  September 13th at 1130 scheduled by [facility].) MD Provider Line Number:[phone] Given: No Specialist Hospital Follow-up  appointment confirmed?: No Reason Specialist Follow-Up Not Confirmed: Appointment Sceduled by [facility] Calling Clinician Do you need transportation to your follow-up appointment?: No (virtual) Do you understand care options if your condition(s) worsen?: Yes-patient verbalized understanding According to daughter, patients wife has declined personal care assistance in the past.  Patient scheduled with [name] RN CM for followup call in 1 week.   SDOH Interventions Today    Flowsheet Row Most Recent Value  SDOH Interventions   Food Insecurity Interventions Intervention Not Indicated  Transportation Interventions Other (Comment)  [will inquire about a virtual visit]      Interventions Today    Flowsheet Row Most Recent Value  Chronic Disease   Chronic disease during today's visit Chronic Obstructive Pulmonary Disease (COPD)  General Interventions   General Interventions Discussed/Reviewed General Interventions Discussed  Exercise Interventions   Exercise Discussed/Reviewed Physical Activity  [Reviewed PT is active with [facility]]  Nutrition Interventions   Nutrition Discussed/Reviewed Nutrition Discussed  [low salt heart healthy diet]  Pharmacy Interventions   Pharmacy Dicussed/Reviewed Medications and their functions  Safety Interventions   Safety Discussed/Reviewed Home Safety  Home Safety Assistive Devices  Advanced Directive  Interventions   Advanced Directives Discussed/Reviewed Advanced Directives Discussed        Rowe Pavy, RN, BSN, CEN [facility] Coordinator [phone]

## 2023-02-24 ENCOUNTER — Telehealth: Payer: Self-pay | Admitting: Family Medicine

## 2023-02-24 NOTE — Telephone Encounter (Signed)
Irving Burton from [facility] called letting Para March know that the pt has stage 2 pressure sore on his sacrum & that he has 9/10 pain on his spine area. Irving Burton also stated the pt bp was 110/50 while laying down. Irving Burton also wanted to ask did Para March receive the results from recent samples [facility] took from the pt? Call back # [phone]

## 2023-02-24 NOTE — Telephone Encounter (Signed)
What are the samples from [facility] that she is referencing?

## 2023-02-24 NOTE — Telephone Encounter (Signed)
Noted. Thanks.

## 2023-02-25 ENCOUNTER — Telehealth: Payer: Self-pay | Admitting: Family Medicine

## 2023-02-25 DIAGNOSIS — J9611 Chronic respiratory failure with hypoxia: Secondary | ICD-10-CM

## 2023-02-25 NOTE — Telephone Encounter (Signed)
Please give the order after verifying with patient/family about palliative care consult.  Let me know if they need a palliative care referral.  Thanks.

## 2023-02-25 NOTE — Telephone Encounter (Signed)
Home Health verbal orders Caller Name: Vinnie Langton Agency Name: [facility]  Callback number: [phone]  Requesting PT  Frequency: 2x a week for 8 weeks  She is requesting a palliative care consult as well. She stated that she spoke with patients wife about it today and she is in agreement but just need an ok from Dr. Para March.   Please forward to [facility] pool or providers CMA

## 2023-02-25 NOTE — Telephone Encounter (Signed)
LMTCB

## 2023-02-25 NOTE — Telephone Encounter (Signed)
Spoke with Irving Burton and she was wanting to know if we had gotten any urine samples from [facility]. I advised her that we have not and have not gotten a call from [facility] or anyone else that something needed to be done for the patient. She is going to call [facility] herself and see what's going on.

## 2023-02-26 NOTE — Telephone Encounter (Signed)
Called and spoke with Vinnie Langton from [facility], advised of the approval of verbal orders and for pallative consult. Patients daughter did verify this is needed/wanted. They need a referral placed for pallative care.

## 2023-02-26 NOTE — Addendum Note (Signed)
Addended by: Joaquim Nam on: 02/26/2023 01:58 PM   Modules accepted: Orders

## 2023-02-26 NOTE — Telephone Encounter (Signed)
Called and left message with [name], patients daughter to return the call to our office.

## 2023-02-26 NOTE — Telephone Encounter (Signed)
Pt's daughter, [name], called back returning [facility] call. [name] states the family does need palliative care desperately for pt. [name] stated she's discuss more during virtual visit tomorrow, 9/13 with Para March. Call back # [phone]

## 2023-02-26 NOTE — Telephone Encounter (Signed)
Noted  

## 2023-02-26 NOTE — Telephone Encounter (Signed)
I put in the palliative care referral. Thanks.

## 2023-02-27 ENCOUNTER — Telehealth (INDEPENDENT_AMBULATORY_CARE_PROVIDER_SITE_OTHER): Payer: PPO | Admitting: Family Medicine

## 2023-02-27 ENCOUNTER — Encounter: Payer: Self-pay | Admitting: Family Medicine

## 2023-02-27 VITALS — BP 113/76 | HR 89 | Ht 70.0 in

## 2023-02-27 DIAGNOSIS — R300 Dysuria: Secondary | ICD-10-CM | POA: Diagnosis not present

## 2023-02-27 DIAGNOSIS — L03119 Cellulitis of unspecified part of limb: Secondary | ICD-10-CM | POA: Diagnosis not present

## 2023-02-27 DIAGNOSIS — Z87440 Personal history of urinary (tract) infections: Secondary | ICD-10-CM | POA: Diagnosis not present

## 2023-02-27 MED ORDER — DOXYCYCLINE HYCLATE 100 MG PO TABS: 100.0000 mg | ORAL_TABLET | Freq: Two times a day (BID) | ORAL | 0 refills | Status: AC

## 2023-02-27 NOTE — Progress Notes (Unsigned)
Virtual visit completed through caregility or similar program Patient location: home  Provider location: [place] at [facility], office  Participants: Patient and me (unless stated otherwise below)  Limitations and rationale for visit method d/w patient.  Patient agreed to proceed.  Patient identified by 2 identifiers. If vitals are not listed, then patient was unable to self-report due to a lack of equipment at home via telehealth  CC: inpatient f/u.   HPI:  patient was discharged home from hospital with home health nursing orders for wound care. [facility] can not staff this request.  I have offered to help family with getting another home health agency involved so patient could get wound care and wife does not want to lose the PT with [facility].  At this time the daughter will do wound care. I have requested that patient be added to [facility] work load when nurse is available.   This patient has a virtual visit with you on Friday as he is bed bound.   Rowe Pavy, RN, BSN, CEN  [facility]  [phone]     His heart rate improved with overall improvement in his condition.    Changed to torsemide in the meantime.  Still on potassium.  No BLE edema.    L great toe wound. It was initially closed with a scab, that was unroofed in the hospital.  Daughter is changing the bandage and it reportedly looks better.  Finishing doxycycline tonight.  Discussed having doxy rx on hand in case sx are worse.    He had u/a at home.  Ucx resulted this AM, with minimal bacterial load.  Sx improved on azo.  [facility] staff did the ucx.  I don't have results yet.  Corrie Dandy is his Futures trader.  Pt stopping AZO in the meantime.    Prue Call: [phone]    PT came out yesterday and he had busy day with that.  Has lift at home to use, has wheelchair.    Anemia improved some by discharge.  Needs f/u labs when possible.    Discussed palliative care.  The need is for home  care- goal is for home health aid for bathing/etc.    Family need to check with palliative care to see if hospice eligible and if the have home care services.    Meds and allergies reviewed.   ROS: Per HPI unless specifically indicated in ROS section   NAD Speech wnl  A/P:  No BLE seen except for trace R BLE edema.    Ucx  Home health aid Labs when possible.  Then can update me when they hear from palliative care.

## 2023-03-01 ENCOUNTER — Telehealth: Payer: Self-pay | Admitting: Family Medicine

## 2023-03-01 NOTE — Telephone Encounter (Signed)
Please request urine culture results from [facility] and let me know when those come in.  [name] [name] is his Futures trader.  The number for [facility] is [phone]    Please see if [facility] or home health is able to collect follow-up serum labs, i.e. serum blood draw at home.  Either way, please let me know.  If home health is able to provide any type of home health aide for bathing/meals/etc., please let me know.  Thanks.

## 2023-03-01 NOTE — Assessment & Plan Note (Signed)
Homebound.  Discussed goals of care.  He wants to stay at home.  Discussed that his situation may worsen and he would need escalating care potentially outside of the home.  Family going to check with palliative care in the meantime to see if they have any options for either hospice eligibility versus extra home care/health aide.  Patient is being added to the work queue for wound care at home.  Discussed having refill of doxycycline on hand in case his wound is worsening in the meantime.  Continue with local wound care for now.  He has home health PT.  Requesting records regarding urine culture.  See following phone note about trying to get labs set up when possible.  Family can update me when they get an update from palliative care.  25 minutes were devoted to patient care in this encounter (this includes time spent reviewing the patient's file/history, interviewing and examining the patient, counseling/reviewing plan with patient).

## 2023-03-02 ENCOUNTER — Emergency Department: Payer: PPO

## 2023-03-02 ENCOUNTER — Ambulatory Visit: Payer: Self-pay

## 2023-03-02 ENCOUNTER — Other Ambulatory Visit: Payer: Self-pay

## 2023-03-02 ENCOUNTER — Telehealth: Payer: Self-pay | Admitting: Family Medicine

## 2023-03-02 ENCOUNTER — Encounter: Payer: Self-pay | Admitting: *Deleted

## 2023-03-02 ENCOUNTER — Emergency Department
Admission: EM | Admit: 2023-03-02 | Discharge: 2023-03-02 | Disposition: A | Discharge status: Home / Self Care | Payer: PPO | Attending: Emergency Medicine | Admitting: Emergency Medicine

## 2023-03-02 DIAGNOSIS — R279 Unspecified lack of coordination: Secondary | ICD-10-CM | POA: Diagnosis not present

## 2023-03-02 DIAGNOSIS — R609 Edema, unspecified: Secondary | ICD-10-CM | POA: Diagnosis not present

## 2023-03-02 DIAGNOSIS — S42351A Displaced comminuted fracture of shaft of humerus, right arm, initial encounter for closed fracture: Secondary | ICD-10-CM | POA: Insufficient documentation

## 2023-03-02 DIAGNOSIS — J449 Chronic obstructive pulmonary disease, unspecified: Secondary | ICD-10-CM | POA: Diagnosis not present

## 2023-03-02 DIAGNOSIS — S4992XA Unspecified injury of left shoulder and upper arm, initial encounter: Secondary | ICD-10-CM | POA: Diagnosis not present

## 2023-03-02 DIAGNOSIS — S4991XA Unspecified injury of right shoulder and upper arm, initial encounter: Secondary | ICD-10-CM | POA: Diagnosis present

## 2023-03-02 DIAGNOSIS — I509 Heart failure, unspecified: Secondary | ICD-10-CM | POA: Diagnosis not present

## 2023-03-02 DIAGNOSIS — S42301A Unspecified fracture of shaft of humerus, right arm, initial encounter for closed fracture: Secondary | ICD-10-CM | POA: Diagnosis not present

## 2023-03-02 DIAGNOSIS — Z743 Need for continuous supervision: Secondary | ICD-10-CM | POA: Diagnosis not present

## 2023-03-02 DIAGNOSIS — X500XXA Overexertion from strenuous movement or load, initial encounter: Secondary | ICD-10-CM | POA: Diagnosis not present

## 2023-03-02 DIAGNOSIS — Y33XXXA Other specified events, undetermined intent, initial encounter: Secondary | ICD-10-CM | POA: Diagnosis not present

## 2023-03-02 LAB — CBC WITH DIFFERENTIAL/PLATELET
Abs Immature Granulocytes: 0.09 10*3/uL — ABNORMAL HIGH (ref 0.00–0.07)
Basophils Absolute: 0.1 10*3/uL (ref 0.0–0.1)
Basophils Relative: 1 %
Eosinophils Absolute: 0.1 10*3/uL (ref 0.0–0.5)
Eosinophils Relative: 1 %
HCT: 33.6 % — ABNORMAL LOW (ref 39.0–52.0)
Hemoglobin: 9.8 g/dL — ABNORMAL LOW (ref 13.0–17.0)
Immature Granulocytes: 1 %
Lymphocytes Relative: 14 %
Lymphs Abs: 2.2 10*3/uL (ref 0.7–4.0)
MCH: 29.4 pg (ref 26.0–34.0)
MCHC: 29.2 g/dL — ABNORMAL LOW (ref 30.0–36.0)
MCV: 100.9 fL — ABNORMAL HIGH (ref 80.0–100.0)
Monocytes Absolute: 0.7 10*3/uL (ref 0.1–1.0)
Monocytes Relative: 4 %
Neutro Abs: 13 10*3/uL — ABNORMAL HIGH (ref 1.7–7.7)
Neutrophils Relative %: 79 %
Platelets: 494 10*3/uL — ABNORMAL HIGH (ref 150–400)
RBC: 3.33 MIL/uL — ABNORMAL LOW (ref 4.22–5.81)
RDW: 14.3 % (ref 11.5–15.5)
WBC: 16.3 10*3/uL — ABNORMAL HIGH (ref 4.0–10.5)
nRBC: 0 % (ref 0.0–0.2)

## 2023-03-02 MED ORDER — FENTANYL CITRATE PF 50 MCG/ML IJ SOSY
50.0000 ug | PREFILLED_SYRINGE | Freq: Once | INTRAMUSCULAR | Status: AC
  Administered 2023-03-02: 50 ug via INTRAVENOUS
  Filled 2023-03-02: qty 1

## 2023-03-02 MED ORDER — OXYCODONE-ACETAMINOPHEN 5-325 MG PO TABS: 1.0000 | ORAL_TABLET | Freq: Once | ORAL | Status: DC

## 2023-03-02 MED ORDER — HYDROCODONE-ACETAMINOPHEN 5-325 MG PO TABS
1.0000 | ORAL_TABLET | Freq: Once | ORAL | Status: AC
  Administered 2023-03-02: 1 via ORAL
  Filled 2023-03-02: qty 1

## 2023-03-02 MED ORDER — HYDROCODONE-ACETAMINOPHEN 5-325 MG PO TABS: 1.0000 | ORAL_TABLET | Freq: Four times a day (QID) | ORAL | 0 refills | Status: AC | PRN

## 2023-03-02 MED ORDER — GADOBUTROL 1 MMOL/ML IV SOLN
10.0000 mL | Freq: Once | INTRAVENOUS | Status: AC | PRN
  Administered 2023-03-02: 10 mL via INTRAVENOUS

## 2023-03-02 NOTE — Discharge Instructions (Signed)
You were seen in the emergency department today for evaluation of an injury to your arm.  Your x-Jaimon Bugaj did show that you have a fracture in your shoulder.  Unfortunately it does appear that you have a bone lesion in this area as well.  Please arrange follow-up with an orthopedic oncologist at [facility] for further evaluation.  You can take Tylenol and ibuprofen as needed to help with pain.  If you have breakthrough pain, I sent a prescription for narcotic pain medicine to your pharmacy that you can take as needed.  Do not drive or operate machinery when taking this.  Return to the ER for new or worsening symptoms.

## 2023-03-02 NOTE — ED Notes (Signed)
Pt is in MRI

## 2023-03-02 NOTE — ED Triage Notes (Signed)
First Nurse Note: Patient to ED via [facility] from home for right shoulder injury. Patient was working with PT at home and was lifting up holding on a bar when the right shoulder "popped." EMS reports deformity. Wears 5L [place] at baseline.

## 2023-03-02 NOTE — ED Notes (Signed)
Pt is on stretcher in triage area, he is on 6L [place]

## 2023-03-02 NOTE — ED Notes (Signed)
Discharge paperwork discussed with pt and family.  Copy given to family.  Await [facility] for transport back home as pt is not ambulatory

## 2023-03-02 NOTE — ED Notes (Signed)
Patient left via [facility], verbalizes understanding of discharge instructions. Opportunity for questioning and answers were provided. Armband removed by staff, pt discharged from ED via EMS. Wheeled out by Doctor, general practice

## 2023-03-02 NOTE — ED Provider Notes (Signed)
[facility] Provider Note    Event Date/Time   First MD Initiated Contact with Patient 03/02/23 1714     (approximate)   History   Shoulder Injury   HPI  [name] [name] is a 89 year old male with history of COPD on 5 L home O2, OSA, aortic stenosis, CHF presenting to the emergency department for evaluation of shoulder injury.  Patient was at PT holding of a bar when his right shoulder "popped".  EMS noted deformity.  No falls.  Did not hit his head.  No known history of cancer.    Physical Exam   Triage Vital Signs: ED Triage Vitals  Encounter Vitals Group     BP 03/02/23 1435 124/75     Systolic BP Percentile --      Diastolic BP Percentile --      Pulse Rate 03/02/23 1435 91     Resp 03/02/23 1435 20     Temp 03/02/23 1435 98.3 F (36.8 C)     Temp src --      SpO2 03/02/23 1435 97 %     Weight --      Height --      Head Circumference --      Peak Flow --      Pain Score 03/02/23 1436 9     Pain Loc --      Pain Education --      Exclude from Growth Chart --     Most recent vital signs: Vitals:   03/02/23 2036 03/02/23 2130  BP: 127/67 130/67  Pulse: 88 86  Resp: 16   Temp: 98.5 F (36.9 C)   SpO2: 100% 100%     General: Awake, interactive  CV:  Regular rate, good peripheral perfusion.  Resp:  Lungs clear, unlabored respirations, on home oxygen Abd:  Soft, nondistended.  Neuro:  Symmetric facial movement, fluid speech MSK:  2+ radial pulses bilaterally.  There is tenderness and deformity over the proximal right upper extremity without open areas of skin. Remainder of right upper and left upper and bilateral lower extremities without focal tenderness   ED Results / Procedures / Treatments   Labs (all labs ordered are listed, but only abnormal results are displayed) Labs Reviewed  CBC WITH DIFFERENTIAL/PLATELET - Abnormal; Notable for the following components:      Result Value   WBC 16.3 (*)    RBC 3.33 (*)    Hemoglobin  9.8 (*)    HCT 33.6 (*)    MCV 100.9 (*)    MCHC 29.2 (*)    Platelets 494 (*)    Neutro Abs 13.0 (*)    Abs Immature Granulocytes 0.09 (*)    All other components within normal limits     EKG EKG independently reviewed interpreted by myself (ER attending) demonstrates:    RADIOLOGY Imaging independently reviewed and interpreted by myself demonstrates:  Humerus x-Carri Spillers with acute fracture with displacement with concerns for underlying lesion with pathologic fracture MRI confirms acute fracture with dominant pathologic lesion.  Radiology also notes that in 2023 patient had a CT chest with mass concerning for malignancy.  PROCEDURES:  Critical Care performed: No  Procedures   MEDICATIONS ORDERED IN ED: Medications  oxyCODONE-acetaminophen (PERCOCET/ROXICET) 5-325 MG per tablet 1 tablet (has no administration in time range)  HYDROcodone-acetaminophen (NORCO/VICODIN) 5-325 MG per tablet 1 tablet (1 tablet Oral Given 03/02/23 1744)  gadobutrol (GADAVIST) 1 MMOL/ML injection 10 mL (10 mLs Intravenous Contrast Given 03/02/23  2026)  fentaNYL (SUBLIMAZE) injection 50 mcg (50 mcg Intravenous Given 03/02/23 2059)     IMPRESSION / MDM / ASSESSMENT AND PLAN / ED COURSE  I reviewed the triage vital signs and the nursing notes.  Differential diagnosis includes, but is not limited to, fracture, dislocation, soft tissue injury  Patient's presentation is most consistent with acute complicated illness / injury requiring diagnostic workup.  89 year old male presenting to the emergency department for evaluation of shoulder pain after minimal trauma.  X-Lahoma Constantin ordered from triage demonstrating acute fracture concerning for pathologic fracture.  Clinical Course as of 03/02/23 2203  Mon Mar 02, 2023  1800 Reviewed with Dr. Allena Katz with orthopedics.  Agrees with concerning findings on x-Yulian Gosney and fracture with minimal mechanism.  Recommends obtaining MRI of the humerus with and without contrast in the ER  for further evaluation.  Following this, recommends follow-up with [facility] orthopedic oncology as an outpatient. [NR]    Clinical Course User Index [NR] Trinna Post, MD   MRI obtained demonstrating acute fracture with pathologic lesion with extension beyond the bone.  Radiology did note that patient had a concerning mass on CT from 2023.  I spoke with the patient and family about this.  They report that they were aware of these findings, but after discussion with the patient he did not wish to pursue any further treatment regarding this.  I did discuss the possibility of discharge with pain medication and outpatient follow-up.  Patient and family are comfortable with this plan.  Strict return precautions provided.  Patient discharged in stable condition.   FINAL CLINICAL IMPRESSION(S) / ED DIAGNOSES   Final diagnoses:  Closed displaced comminuted fracture of shaft of right humerus, initial encounter     Rx / DC Orders   ED Discharge Orders          Ordered    HYDROcodone-acetaminophen (NORCO) 5-325 MG tablet  Every 6 hours PRN        03/02/23 2203             Note:  This document was prepared using Dragon voice recognition software and may include unintentional dictation errors.   Trinna Post, MD 03/02/23 2203

## 2023-03-02 NOTE — ED Notes (Signed)
Ice pack applied to right humerus.

## 2023-03-02 NOTE — Telephone Encounter (Signed)
Mark from [facility] called stating he saw the pt for [facility] PT. Loraine Leriche stated the pt was laying down so he attempted to help the pt sit up & while helping, the pt's right shoulder made a loud popping noise. Loraine Leriche stated the pt screamed out loud in pain & was sent the the ER. Loraine Leriche stated the laid the pt back down & applied ice on his shoulder until EMS arrived. Call back # [phone], secured

## 2023-03-02 NOTE — Patient Outreach (Signed)
Care Coordination   03/02/2023 Name: [name] [name] MRN: [id number] DOB: 15-Jun-1934   Care Coordination Outreach Attempts:  Successful contact made with patients spouse / designated party release, Lytle Michaels. Wife states patient is expecting home health PT within the next 30 minutes and request to reschedule today's appointment with RN case manager to another day.  Appointment rescheduled per request.   Follow Up Plan:  Additional outreach attempts will be made to offer the patient care coordination information and services.   Encounter Outcome:  Patient Request to Call Back   Care Coordination Interventions:  No, not indicated    [name] [facility] Care Coordination [phone] direct line

## 2023-03-02 NOTE — ED Triage Notes (Signed)
Pt reports that he has pain in his right shoulder since it "popped" while he was getting help at home.  Pt wears 6L [place] at home.

## 2023-03-03 ENCOUNTER — Encounter: Payer: Self-pay | Admitting: Family Medicine

## 2023-03-03 NOTE — Telephone Encounter (Signed)
Called office they will have Corrie Dandy return call.

## 2023-03-03 NOTE — Telephone Encounter (Signed)
Noted. Thanks.

## 2023-03-04 ENCOUNTER — Ambulatory Visit: Payer: Self-pay

## 2023-03-04 NOTE — Patient Instructions (Addendum)
Visit Information  Thank you for taking time to visit with me today. Please don't hesitate to contact me if I can be of assistance to you.   Following are the goals we discussed today:   Goals Addressed             This Visit's Progress    Post hospital follow up and health care management/ education       Interventions Today    Flowsheet Row Most Recent Value  Chronic Disease   Chronic disease during today's visit Chronic Obstructive Pulmonary Disease (COPD), Congestive Heart Failure (CHF), Other  [Left great toe wound  Cellulitis of right lateral thigh, right shoulder fracture]  General Interventions   General Interventions Discussed/Reviewed General Interventions Reviewed, Doctor Visits, [employer] of current treatment plan for medical conditions listed and patients adherence to plan as established by provider. Assessed for heart failure ,  COPD, pain level, and ongoing cellulitis symptoms.]  Doctor Visits Discussed/Reviewed Doctor Visits Reviewed  [reviewed upcoming provider visits. Discussed upcoming palliative care visit 03/13/23. Inquired if patient still receiving home health services with [facility]. Referral sent to community resource guide for in home care list.]  Education Interventions   Education Provided --  [Advised to notify provider for any new symptoms or ongoing concerns.]  Pharmacy Interventions   Pharmacy Dicussed/Reviewed Pharmacy Topics Reviewed  Safety Interventions   Safety Discussed/Reviewed Safety Reviewed  Pearletha Furl for falls.]              Our next appointment is by telephone on 03/17/23 at 1:30 pm  Please call the care guide team at [phone] if you need to cancel or reschedule your appointment.   If you are experiencing a Mental Health or Behavioral Health Crisis or need someone to talk to, please call the Suicide and Crisis Lifeline: 988 call 1-800-273-TALK (toll free, 24 hour hotline)  Patient verbalizes understanding of  instructions and care plan provided today and agrees to view in MyChart. Active MyChart status and patient understanding of how to access instructions and care plan via MyChart confirmed with patient.     [name] RN,BSN,CCM [facility] Care Coordination [phone] direct line  Heart Failure Action Plan A heart failure action plan helps you understand what to do when you have symptoms of heart failure. Your action plan is a color-coded plan that lists the symptoms to watch for and indicates what actions to take. If you have symptoms in the red zone, you need medical care right away. If you have symptoms in the yellow zone, you are having problems. If you have symptoms in the Weslie Pretlow zone, you are doing well. Follow the plan that was created by you and your health care provider. Review your plan each time you visit your health care provider. Red zone These signs and symptoms mean you should get medical help right away: You have trouble breathing when resting. You have a dry cough that is getting worse. You have swelling or pain in your legs or abdomen that is getting worse. You suddenly gain more than 2-3 lb (0.9-1.4 kg) in 24 hours, or more than 5 lb (2.3 kg) in a week. This amount may be more or less depending on your condition. You have trouble staying awake or you feel confused. You have chest pain. You do not have an appetite. You pass out. You have worsening sadness or depression. If you have any of these symptoms, call your local emergency services (911 in the [place] right away. Do  not drive yourself to the hospital. Yellow zone These signs and symptoms mean your condition may be getting worse and you should make some changes: You have trouble breathing when you are active, or you need to sleep with your head raised on extra pillows to help you breathe. You have swelling in your legs or abdomen. You gain 2-3 lb (0.9-1.4 kg) in 24 hours, or 5 lb (2.3 kg) in a week. This amount may be  more or less depending on your condition. You get tired easily. You have trouble sleeping. You have a dry cough. If you have any of these symptoms: Contact your health care provider within the next day. Your health care provider may adjust your medicines. Zhi Geier zone These signs mean you are doing well and can continue what you are doing: You do not have shortness of breath. You have very little swelling or no new swelling. Your weight is stable (no gain or loss). You have a normal activity level. You do not have chest pain or any other new symptoms. Follow these instructions at home: Take over-the-counter and prescription medicines only as told by your health care provider. Weigh yourself daily. Your target weight is __________ lb (__________ kg). Call your health care provider if you gain more than __________ lb (__________ kg) in 24 hours, or more than __________ lb (__________ kg) in a week. Health care provider name: _____________________________________________________ Health care provider phone number: _____________________________________________________ Eat a heart-healthy diet. Work with a diet and nutrition specialist (dietitian) to create an eating plan that is best for you. Keep all follow-up visits. This is important. Where to find more information American Heart Association: Summary A heart failure action plan helps you understand what to do when you have symptoms of heart failure. Follow the action plan that was created by you and your health care provider. Get help right away if you have any symptoms in the red zone. This information is not intended to replace advice given to you by your health care provider. Make sure you discuss any questions you have with your health care provider. Document Revised: 09/10/2021 Document Reviewed: 01/16/2020 Elsevier Patient Education  2024 Elsevier Inc.  Living with COPD Being diagnosed with chronic obstructive pulmonary disease  (COPD) changes your life physically and emotionally. Having COPD can affect your ability to work and do things you enjoy. COPD is not the same for everyone, and it may change over time. Your health care providers can help you come up with the COPD management plan that works best for you. How to manage lifestyle changes Treatment plan Work closely with your health care providers. Follow your COPD management plan. This plan includes: Instructions about activities, exercises, diet, medicines, what to do when COPD flares up, and when to call your health care provider. A pulmonary rehabilitation program. In pulmonary rehab, you will learn about COPD, do exercises for fitness and breathing, and get support from health care providers and other people who have COPD. Managing emotions and stress Living with a chronic disease means you may also struggle with stressful emotions, such as sadness, fear, and worry. Here are some ways to manage these emotions: Talk to someone about your fear, anxiety, depression, or stress. Learn strategies to avoid or reduce stress and ask for help if you are struggling with depression or anxiety. Consider joining a COPD support group, online or in person.  Adjusting to changes COPD may limit the things you can do, but you can make certain changes to help  you cope with the diagnosis. Ask for help when you need it. Getting support from friends, family, and your health care team is an important part of managing the condition. Try to get regular exercise as prescribed by a health care provider or pulmonary rehab team. Exercising can help COPD, even if you are a bit short of breath. Take steps to prevent infection and protect your lungs: Wash your hands often and avoid being in crowds. Stay away from friends and family members who are sick. Check your local air quality each day, and stay out of areas where air pollution is likely. How to recognize changes in your  condition Recognizing changes in your COPD COPD is a progressive disease. It is important to let the health care team know if your COPD is getting worse. Your treatment plan may need to change. Watch for: Increased shortness of breath, wheezing, cough, or fatigue. Loss of ability to exercise or perform daily activities, like climbing stairs. More frequent symptom flares. Signs of depression or anxiety. Recognizing stress It is normal to have additional stress when you have COPD. However, prolonged stress and anxiety can make COPD worse and lead to depression. Recognize the warning signs, which include: Feeling sad or worried more often or most of the time. Having less energy and losing interest in pleasurable activities. Changes in your appetite or sleeping patterns. Being easily angered or irritated. Having unexplained aches and pains, digestive problems, or headaches. Follow these instructions at home: Eating and drinking  Eat foods that are high in fiber, such as fresh fruits and vegetables, whole grains, and beans. Limit foods that are high in fat and processed sugars, such as fried or sweet foods. Follow a balanced diet and maintain a healthy weight. Being overweight or underweight can make COPD worse. You may work with a Data processing manager as part of your pulmonary rehab program. Drink enough fluid to keep your urine pale yellow. If you drink alcohol: Limit how much you have to: 0-1 drink a day for women who are not pregnant. 0-2 drinks a day for men. Know how much alcohol is in your drink. In the [place] one drink equals one 12 oz bottle of beer (355 mL), one 5 oz glass of wine (148 mL), or one 1 oz glass of hard liquor (44 mL). Lifestyle If you smoke, the most important thing that you can do is to stop smoking. Continuing to smoke will cause the disease to progress faster. Do not use any products that contain nicotine or tobacco. These products include cigarettes, chewing tobacco, and vaping  devices, such as e-cigarettes. If you need help quitting, ask your health care provider. Avoid exposure to things that irritate your lungs, such as smoke, chemicals, and fumes. Activity Balance exercise and rest. Take short walks every 1-2 hours. This is important to improve blood flow and breathing. Ask for help if you feel weak or unsteady. Do exercises that include controlled breathing with body movement, such as tai chi. General instructions Take over-the-counter and prescription medicines only as told by your health care provider. Take vitamin and protein supplements as told by your health care provider or dietitian. Practice good oral hygiene and see your dental care provider regularly. An oral infection can also spread to your lungs. Make sure you receive all the vaccines that your health care provider recommends. Keep all follow-up visits. This is important. Contact a health care provider if you: Are struggling to manage your COPD. Have emotional stress that interferes with your  ability to cope with COPD. Get help right away if you: Have thoughts of suicide, death, or hurting yourself or others. If you ever feel like you may hurt yourself or others, or have thoughts about taking your own life, get help right away. Go to your nearest emergency department or: Call your local emergency services (911 in the [place] Call a suicide crisis helpline, such as the National Suicide Prevention Lifeline at 740-621-6284 or 988 in the [place] This is open 24 hours a day in the [place] Text the Crisis Text Line at 206 494 3758 (in the [place] Summary Being diagnosed with chronic obstructive pulmonary disease (COPD) changes your life physically and emotionally. Work with your health care providers and follow your COPD management plan. A pulmonary rehabilitation program is an important part of COPD management. Prolonged stress, anxiety, and depression can make COPD worse. Let your health care provider know if  emotional stress interferes with your ability to cope with and manage COPD. This information is not intended to replace advice given to you by your health care provider. Make sure you discuss any questions you have with your health care provider. Document Revised: 12/26/2020 Document Reviewed: 06/20/2020 Elsevier Patient Education  2024 [employer].

## 2023-03-04 NOTE — Telephone Encounter (Signed)
Please contact referral staff about palliative care referral. Order should already be in EMR.  Thanks.

## 2023-03-04 NOTE — Patient Outreach (Signed)
Care Coordination   Initial Visit Note   03/06/2023 Name: [name] [name] MRN: [id number] DOB: 09/13/1933  [name] [name] is a 89 y.o. year old male who sees Joaquim Nam, MD for primary care. I  spoke with daughter and designated party release, Drue Second.   What matters to the patients health and wellness today?  Daughter states patient was recently in the hospital from 02/14/23 to 02/19/23 for weakness and heart issues and seen in the ED on 03/02/23 for a fracture to right shoulder.  She states patient was working with home PT and  holding onto trapez bar when he turned himself shoulder popped.  Daughter states an MRI was done and there was a mass noted to shoulder.  Daughter states patient has a mass in his lung as well.  She states at this time home health services with [facility] have been discontinued.   Daughter  states she and her mother are the primary caregivers for patient.  She states patient is currently on 5 L of oxygen around the clock.  Daughter states patient mainly requires full care. She states patient is scheduled for assessment with [facility] palliative care and hospice on 03/13/23.   Daughter states she is doing dressing changes to wound on patients left big toe and right outer thigh.  She reports thigh was infected however patient has completed antibiotic treatment and both areas appear to be healing.    Goals Addressed             This Visit's Progress    Post hospital follow up and health care management/ education       Interventions Today    Flowsheet Row Most Recent Value  Chronic Disease   Chronic disease during today's visit Chronic Obstructive Pulmonary Disease (COPD), Congestive Heart Failure (CHF), Other  [Left great toe wound  Cellulitis of right lateral thigh, right shoulder fracture]  General Interventions   General Interventions Discussed/Reviewed General Interventions Reviewed, Doctor Visits, [employer] of current treatment plan for  medical conditions listed and patients adherence to plan as established by provider. Assessed for heart failure ,  COPD, pain level, and ongoing cellulitis symptoms.]  Doctor Visits Discussed/Reviewed Doctor Visits Reviewed  [reviewed upcoming provider visits. Discussed upcoming palliative care visit 03/13/23. Inquired if patient still receiving home health services with [facility]. Referral sent to community resource guide for in home care list.]  Education Interventions   Education Provided Provided Education, Provided Printed Education  [Advised to notify provider for any new symptoms or ongoing concerns. Discussed COPD and heart failure signs/ symptoms and action plan. Advised to notify provider for mild / moderate symptoms and call 911 for severe symptoms. Education sent on COPD/HF.]  Pharmacy Interventions   Pharmacy Dicussed/Reviewed Pharmacy Topics Reviewed  Safety Interventions   Safety Discussed/Reviewed Safety Reviewed  Pearletha Furl for falls.]              SDOH assessments and interventions completed:  Yes  SDOH Interventions Today    Flowsheet Row Most Recent Value  SDOH Interventions   Food Insecurity Interventions Intervention Not Indicated  Housing Interventions Intervention Not Indicated        Care Coordination Interventions:  Yes, provided   Follow up plan: Follow up call scheduled for 03/17/23    Encounter Outcome:  Patient Visit Completed   [name] RN,BSN,CCM [facility] Care Coordination [phone] direct line

## 2023-03-04 NOTE — Telephone Encounter (Signed)
I have to defer to [facility] staff about any possible discharge from their service.  If they have guidelines about continuing/discontinuing care, then I will have to defer to them.    I will respond to the mychart message.  Thanks.

## 2023-03-04 NOTE — Telephone Encounter (Signed)
Irving Burton; therapist from [facility] called today about patient. She is wanting to discharge the patient from services due to it being unsafe to work with patient. Irving Burton stated that patient left hospital and denied all care and treatment options there. He was injured by PT yesterday with a humoral fracture and she does not feel safe continuing with the patient. Irving Burton stated her license is on the line and patient needs to be in a facility. There is a Clinical cytogeneticist message from patients daughter requesting palliative care for patient. Irving Burton would like call back after Dr. Para March reviews at [phone]

## 2023-03-04 NOTE — Telephone Encounter (Signed)
[name] [name], [name] [name], CMA  Carolanne Grumbling with [facility] accepted the Palliative Care referral on 02/27/23. I called [facility]. They confirmed pt was seen at home for Palliative care yesterday, 03/03/23. The patient has another appointment scheduled on 03/13/23 as well.  ===================== Noted. Thanks.

## 2023-03-05 ENCOUNTER — Encounter: Payer: Self-pay | Admitting: Family Medicine

## 2023-03-05 ENCOUNTER — Telehealth: Payer: Self-pay | Admitting: Family Medicine

## 2023-03-05 DIAGNOSIS — R269 Unspecified abnormalities of gait and mobility: Secondary | ICD-10-CM | POA: Diagnosis not present

## 2023-03-05 NOTE — Telephone Encounter (Signed)
Please give the order.  Thanks.   

## 2023-03-05 NOTE — Telephone Encounter (Signed)
Tried to call [place] back twice and there was no answer either time. First time I transferred to scheduling and they could not help me; 2nd time I could not get anyone either and unsure where to leave a message. Will try again tomorrow.

## 2023-03-05 NOTE — Telephone Encounter (Signed)
[name] from Palliative Care called over and stated that they need an order to be sent to any home health agency for [facility] PT and OT. Thank you!

## 2023-03-06 NOTE — Telephone Encounter (Signed)
Okay I will follow for further clarification of location.

## 2023-03-06 NOTE — Telephone Encounter (Signed)
Not many Home Health agencies, if any anymore offer Palliative along with HH.   The patient will have to be with a [facility] agency AND Palliative care. So he will need to keep the Palliative Care as scheduled.  Is the patient wanting to go with [facility] in [place] area?

## 2023-03-06 NOTE — Telephone Encounter (Signed)
Called number no answer no way to leave message.

## 2023-03-10 ENCOUNTER — Telehealth: Payer: Self-pay | Admitting: Family Medicine

## 2023-03-10 NOTE — Telephone Encounter (Signed)
[name] from [facility] called requesting for pt's [name] [name] orders be sent to [facility]? [name] states the pt also needs to have Nursing added. Call back # [phone], secured.

## 2023-03-11 NOTE — Telephone Encounter (Signed)
I have already changed to [facility] and sent them the request this morning, they are reviewing.

## 2023-03-11 NOTE — Telephone Encounter (Signed)
Order had been sent to [facility] this morning per referral coordinator

## 2023-03-11 NOTE — Telephone Encounter (Signed)
Patient wife [name] [name] called in to follow up on this. She stated that she is needing some assistance with giving him a bath and getting him turned. She needing someone to come out everyday and not once every two weeks. She stated that she don't need anyone all day just a few hours a day. She stated that she would like the referral to go to [facility]. She stated that Palliative Care came out yesterday but they not going to be much help for what she need.

## 2023-03-12 ENCOUNTER — Telehealth: Payer: Self-pay | Admitting: Family Medicine

## 2023-03-12 DIAGNOSIS — J449 Chronic obstructive pulmonary disease, unspecified: Secondary | ICD-10-CM

## 2023-03-12 NOTE — Telephone Encounter (Signed)
See updated referral notes. There was a message forward to Dr Para March from [facility] (referral messages).

## 2023-03-12 NOTE — Telephone Encounter (Signed)
Pattricia Boss from [facility] called over and stated that they received a referral from the hospital for the patient. She wanted to speak with someone regarding this. She can be reached at [phone]. Thank you!

## 2023-03-12 NOTE — Telephone Encounter (Signed)
[name] from [facility] contacted the office, states the patient's family has reached out to her and is requesting a referral for hospice care for pt. Patient's family is all in agreement, states the patient's condition has changed. Caller also wanted to know if Dr. Para March will remain attending over patient's hospice care? Please advise [phone] with response

## 2023-03-13 DIAGNOSIS — J9611 Chronic respiratory failure with hypoxia: Secondary | ICD-10-CM | POA: Diagnosis not present

## 2023-03-13 DIAGNOSIS — J449 Chronic obstructive pulmonary disease, unspecified: Secondary | ICD-10-CM | POA: Diagnosis not present

## 2023-03-13 NOTE — Telephone Encounter (Signed)
Please verify with patient's family.  I can attend, I want their help, and I put in the referral.  Thanks.

## 2023-03-13 NOTE — Telephone Encounter (Signed)
Called and spoke with Gavin Pound, pt's daughter.  She and her mother are in agreement with hospice referral.  Attempted to return phone call from [place].  Left message with needed information.

## 2023-03-13 NOTE — Telephone Encounter (Signed)
Sherry from [facility] called over and wanted to know if Dr. Para March would serve as attending of record and do he believe that [name] has 6 months of less. She can be reached at [phone]. Thank you!

## 2023-03-13 NOTE — Telephone Encounter (Signed)
Returned [name]'s phone call and let her know that family has decided to go with hospice for pt.

## 2023-03-13 NOTE — Telephone Encounter (Signed)
See following message re: hospice.

## 2023-03-13 NOTE — Telephone Encounter (Signed)
[facility] called in to follow up on the request. Informed them that the message was sent back just waiting on response.

## 2023-03-15 DIAGNOSIS — R2689 Other abnormalities of gait and mobility: Secondary | ICD-10-CM | POA: Diagnosis not present

## 2023-03-15 NOTE — Telephone Encounter (Signed)
It would be reasonable to expect his life expectancy to be 6 months or less, given his current medical conditions.  I can be the attending of record but I would like hospice help.

## 2023-03-16 NOTE — Telephone Encounter (Signed)
Spoke with Cordelia Pen and let her know life expectancy and attending requests.

## 2023-03-17 ENCOUNTER — Ambulatory Visit: Payer: Self-pay

## 2023-03-17 NOTE — Patient Outreach (Signed)
Care Coordination   03/17/2023 Name: [name] [name] MRN: [id number] DOB: 1933-06-21   Care Coordination Outreach Attempts:  An unsuccessful telephone outreach was attempted for a scheduled appointment today.  Follow Up Plan:  Additional outreach attempts will be made to offer the patient care coordination information and services.  HIPAA compliant voice message left with return call phone number.   Encounter Outcome:  No Answer   Care Coordination Interventions:  No, not indicated    [name] [facility] Care Coordination [phone] direct line

## 2023-03-19 ENCOUNTER — Telehealth: Payer: Self-pay | Admitting: Family Medicine

## 2023-03-19 NOTE — Telephone Encounter (Signed)
Done.  Please update chart.  I previously called his daughter to give my condolences.  He was a kind man.

## 2023-03-19 NOTE — Telephone Encounter (Signed)
[name] [name] from [employer] [facility] called in and stated that they assigned his death certificate to Dr. Para March and needs him to sign it. She stated that he passed yesterday morning in his home.

## 2023-03-20 ENCOUNTER — Telehealth: Payer: Self-pay

## 2023-03-20 NOTE — Patient Outreach (Signed)
Care Coordination   Follow Up Visit Note   03/20/2023 Name: [name] [name] MRN: [id number] DOB: 1934-03-14  [name] [name] is a 89 y.o. year old male who sees [name] Nam, MD for primary care.  Patient deceased.      Goals Addressed             This Visit's Progress    COMPLETED: Post hospital follow up and health care management/ education       Interventions Today    Flowsheet Row Most Recent Value  Chronic Disease   Chronic disease during today's visit Chronic Obstructive Pulmonary Disease (COPD), Congestive Heart Failure (CHF), Other  [Left great toe wound  Cellulitis of right lateral thigh, right shoulder fracture]  General Interventions   General Interventions Discussed/Reviewed General Interventions Reviewed, Doctor Visits, [employer] of current treatment plan for medical conditions listed and patients adherence to plan as established by provider. Assessed for heart failure ,  COPD, pain level, and ongoing cellulitis symptoms.]  Doctor Visits Discussed/Reviewed Doctor Visits Reviewed  [reviewed upcoming provider visits. Discussed upcoming palliative care visit 03/13/23. Inquired if patient still receiving home health services with [facility]. Referral sent to community resource guide for in home care list.]  Education Interventions   Education Provided Provided Education, Provided Printed Education  [Advised to notify provider for any new symptoms or ongoing concerns. Discussed COPD and heart failure signs/ symptoms and action plan. Advised to notify provider for mild / moderate symptoms and call 911 for severe symptoms. Education sent on COPD/HF.]  Pharmacy Interventions   Pharmacy Dicussed/Reviewed Pharmacy Topics Reviewed  Safety Interventions   Safety Discussed/Reviewed Safety Reviewed  Pearletha Furl for falls.]              SDOH assessments and interventions completed:  No     Care Coordination Interventions:  No, not indicated   Follow up  plan: No further intervention required.   Encounter Outcome:  Patient Visit Completed   [name] Care Coordination [phone] direct line

## 2023-03-20 NOTE — Telephone Encounter (Signed)
Chart processed

## 2023-04-17 DEATH — deceased
